# Patient Record
Sex: Male | Born: 1955 | Race: White | Hispanic: No | Marital: Married | Smoking: Former smoker
Health system: Southern US, Community
[De-identification: ages and names within clinical notes are randomized; demographics above are authoritative.]

## PROBLEM LIST (undated history)

## (undated) DIAGNOSIS — I472 Ventricular tachycardia: Secondary | ICD-10-CM

## (undated) DIAGNOSIS — F419 Anxiety disorder, unspecified: Secondary | ICD-10-CM

## (undated) DIAGNOSIS — N179 Acute kidney failure, unspecified: Secondary | ICD-10-CM

## (undated) DIAGNOSIS — J449 Chronic obstructive pulmonary disease, unspecified: Secondary | ICD-10-CM

## (undated) DIAGNOSIS — I509 Heart failure, unspecified: Secondary | ICD-10-CM

## (undated) DIAGNOSIS — N200 Calculus of kidney: Secondary | ICD-10-CM

## (undated) DIAGNOSIS — R931 Abnormal findings on diagnostic imaging of heart and coronary circulation: Secondary | ICD-10-CM

## (undated) DIAGNOSIS — A400 Sepsis due to streptococcus, group A: Secondary | ICD-10-CM

## (undated) DIAGNOSIS — I10 Essential (primary) hypertension: Secondary | ICD-10-CM

## (undated) DIAGNOSIS — I700 Atherosclerosis of aorta: Secondary | ICD-10-CM

## (undated) DIAGNOSIS — Z7902 Long term (current) use of antithrombotics/antiplatelets: Secondary | ICD-10-CM

## (undated) DIAGNOSIS — I255 Ischemic cardiomyopathy: Secondary | ICD-10-CM

## (undated) DIAGNOSIS — I2510 Atherosclerotic heart disease of native coronary artery without angina pectoris: Secondary | ICD-10-CM

## (undated) DIAGNOSIS — I5020 Unspecified systolic (congestive) heart failure: Secondary | ICD-10-CM

## (undated) DIAGNOSIS — I779 Disorder of arteries and arterioles, unspecified: Secondary | ICD-10-CM

## (undated) DIAGNOSIS — R6521 Severe sepsis with septic shock: Secondary | ICD-10-CM

## (undated) DIAGNOSIS — R55 Syncope and collapse: Secondary | ICD-10-CM

## (undated) DIAGNOSIS — Z7982 Long term (current) use of aspirin: Secondary | ICD-10-CM

## (undated) DIAGNOSIS — I272 Pulmonary hypertension, unspecified: Secondary | ICD-10-CM

## (undated) DIAGNOSIS — E785 Hyperlipidemia, unspecified: Secondary | ICD-10-CM

## (undated) DIAGNOSIS — Z95810 Presence of automatic (implantable) cardiac defibrillator: Secondary | ICD-10-CM

## (undated) DIAGNOSIS — R9430 Abnormal result of cardiovascular function study, unspecified: Secondary | ICD-10-CM

## (undated) DIAGNOSIS — Z860100 Personal history of colon polyps, unspecified: Secondary | ICD-10-CM

## (undated) DIAGNOSIS — Z801 Family history of malignant neoplasm of trachea, bronchus and lung: Secondary | ICD-10-CM

## (undated) DIAGNOSIS — J189 Pneumonia, unspecified organism: Secondary | ICD-10-CM

## (undated) DIAGNOSIS — I444 Left anterior fascicular block: Secondary | ICD-10-CM

## (undated) HISTORY — PX: PROSTATE SURGERY: SHX751

## (undated) HISTORY — DX: Family history of malignant neoplasm of trachea, bronchus and lung: Z80.1

## (undated) HISTORY — DX: Personal history of colonic polyps: Z86.010

## (undated) HISTORY — PX: OTHER SURGICAL HISTORY: SHX169

## (undated) HISTORY — DX: Unspecified systolic (congestive) heart failure: I50.20

## (undated) HISTORY — DX: Essential (primary) hypertension: I10

## (undated) HISTORY — DX: Hyperlipidemia, unspecified: E78.5

## (undated) HISTORY — PX: TUMOR REMOVAL: SHX12

## (undated) HISTORY — PX: CORONARY STENT PLACEMENT: SHX1402

## (undated) HISTORY — DX: Sepsis due to Streptococcus, group A: A40.0

## (undated) HISTORY — DX: Sepsis due to Streptococcus, group A: N17.9

## (undated) HISTORY — DX: Personal history of colon polyps, unspecified: Z86.0100

## (undated) HISTORY — PX: APPENDECTOMY: SHX54

## (undated) HISTORY — DX: Presence of automatic (implantable) cardiac defibrillator: Z95.810

## (undated) HISTORY — DX: Ischemic cardiomyopathy: I25.5

## (undated) HISTORY — DX: Pneumonia, unspecified organism: J18.9

## (undated) HISTORY — DX: Syncope and collapse: R55

## (undated) HISTORY — DX: Ventricular tachycardia: I47.2

## (undated) HISTORY — DX: Atherosclerotic heart disease of native coronary artery without angina pectoris: I25.10

## (undated) HISTORY — DX: Severe sepsis with septic shock: R65.21

---

## 1991-07-04 DIAGNOSIS — I2109 ST elevation (STEMI) myocardial infarction involving other coronary artery of anterior wall: Secondary | ICD-10-CM

## 1991-07-04 HISTORY — DX: ST elevation (STEMI) myocardial infarction involving other coronary artery of anterior wall: I21.09

## 1991-07-05 HISTORY — PX: CORONARY ANGIOPLASTY: SHX604

## 1991-08-06 HISTORY — PX: CORONARY ANGIOPLASTY: SHX604

## 1991-08-09 HISTORY — PX: CORONARY ANGIOPLASTY: SHX604

## 1991-10-28 HISTORY — PX: CORONARY ANGIOPLASTY: SHX604

## 1991-11-02 HISTORY — PX: CORONARY ANGIOPLASTY: SHX604

## 1992-01-10 HISTORY — PX: CORONARY ANGIOPLASTY: SHX604

## 1993-09-26 HISTORY — PX: CORONARY ANGIOPLASTY: SHX604

## 1997-06-29 DIAGNOSIS — I2129 ST elevation (STEMI) myocardial infarction involving other sites: Secondary | ICD-10-CM

## 1997-06-29 HISTORY — DX: ST elevation (STEMI) myocardial infarction involving other sites: I21.29

## 1997-06-29 HISTORY — PX: CORONARY ANGIOPLASTY WITH STENT PLACEMENT: SHX49

## 1997-06-29 LAB — HM HEPATITIS C SCREENING LAB: HM Hepatitis Screen: NEGATIVE

## 1998-07-12 HISTORY — PX: CORONARY ANGIOPLASTY WITH STENT PLACEMENT: SHX49

## 1999-07-30 HISTORY — PX: TUMOR REMOVAL: SHX12

## 2004-05-09 ENCOUNTER — Inpatient Hospital Stay: Payer: Self-pay | Admitting: Internal Medicine

## 2004-05-09 ENCOUNTER — Ambulatory Visit: Payer: Self-pay | Admitting: Internal Medicine

## 2004-05-10 ENCOUNTER — Other Ambulatory Visit: Payer: Self-pay

## 2005-07-17 ENCOUNTER — Ambulatory Visit: Payer: Self-pay | Admitting: Internal Medicine

## 2005-07-25 ENCOUNTER — Ambulatory Visit: Payer: Self-pay | Admitting: Internal Medicine

## 2006-09-11 HISTORY — PX: CORONARY ANGIOPLASTY: SHX604

## 2007-09-07 ENCOUNTER — Ambulatory Visit: Payer: Self-pay | Admitting: Cardiovascular Disease

## 2009-05-24 ENCOUNTER — Ambulatory Visit: Payer: Self-pay | Admitting: Internal Medicine

## 2010-01-17 HISTORY — PX: CORONARY ANGIOPLASTY: SHX604

## 2010-02-23 ENCOUNTER — Ambulatory Visit: Payer: Self-pay | Admitting: Cardiovascular Disease

## 2010-02-23 DIAGNOSIS — I2510 Atherosclerotic heart disease of native coronary artery without angina pectoris: Secondary | ICD-10-CM

## 2010-02-23 DIAGNOSIS — I959 Hypotension, unspecified: Secondary | ICD-10-CM | POA: Insufficient documentation

## 2010-02-23 DIAGNOSIS — I25118 Atherosclerotic heart disease of native coronary artery with other forms of angina pectoris: Secondary | ICD-10-CM | POA: Insufficient documentation

## 2010-02-23 DIAGNOSIS — E785 Hyperlipidemia, unspecified: Secondary | ICD-10-CM

## 2010-02-26 ENCOUNTER — Encounter: Payer: Self-pay | Admitting: Cardiovascular Disease

## 2010-08-28 NOTE — Cardiovascular Report (Signed)
Summary: Cardiac Cath Other  Cardiac Cath Other   Imported By: West Carbo 02/26/2010 08:26:37  _____________________________________________________________________  External Attachment:    Type:   Image     Comment:   External Document

## 2010-08-28 NOTE — Letter (Signed)
Summary: Historic Patient File  Historic Patient File   Imported By: West Carbo 02/26/2010 08:27:12  _____________________________________________________________________  External Attachment:    Type:   Image     Comment:   External Document

## 2010-08-28 NOTE — Letter (Signed)
Summary: External Correspondence  External Correspondence   Imported By: West Carbo 02/23/2010 13:39:34  _____________________________________________________________________  External Attachment:    Type:   Image     Comment:   External Document

## 2010-08-28 NOTE — Assessment & Plan Note (Signed)
Summary: NP6/GLC   Visit Type:  Initial Consult  CC:  c/o going to [facility] with chest pain in June 2011 was set up for stress test and troponin levels then June 22 and 2011 went to [facility] with chest pain and to discuss stress test and was then set up for a cardiac cath..  History of Present Illness: Ian Mills is a 55 year old gentleman with a history of coronary artery disease, 3 stents placed to his mid LAD, one stent to the PDA, one stent to the OM 3 in [place] 2008 with recent episodes of chest pressure during his divorce with stress test at [facility], followup cardiac catheterization at [facility]. He presents to establish care. He was last seen at [facility] in September 2009.  he reports that otherwise he is doing well. He denies any recent episodes of chest pain and reports that these have resolved since his divorce was finalized. He has no shortness of breath or symptoms with exertion. He is asking for all new prescriptions today. He does have a current care physician.  MRI done of his heart in February 2008 showed anterior and anterolateral region was nonviable. Ejection fraction by catheterization at that time was 35%.  Cholesterol in February 2009 showed total cholesterol 173, LDL 107, HDL 42, triglycerides 121  His cardiac catheterization dated February 2008 documents 40% proximal RCA disease, 40% mid left circumflex, 80% mid LAD, small D2 and D3.  EKG shows normal sinus rhythm with rate 77 beats per minute, Q waves in the anterior leads consistent with old anterior infarct, T wave abnormality in leads V3 through V6, one and aVL  Preventive Screening-Counseling & Management  Alcohol-Tobacco     Smoking Status: quit  Caffeine-Diet-Exercise     Does Patient Exercise: yes      Drug Use:  no.    Current Medications (verified): 1)  Aspirin 81 Mg Tbec (Aspirin) .Marland Kitchen.. 1 Tablet Once Daily 2)  Ramipril 10 Mg Caps (Ramipril) .Marland Kitchen.. 1  Tablet Two Times A Day 3)  Klor-Con 10 10 Meq Cr-Tabs (Potassium Chloride) .Marland Kitchen.. 1 Tablet Once Daily 4)  Alprazolam 0.5 Mg Tabs (Alprazolam) .... As Needed 5)  Caduet 10-80 Mg Tabs (Amlodipine-Atorvastatin) .Marland Kitchen.. 1 Tablet Once Daily 6)  Zetia 10 Mg Tabs (Ezetimibe) .Marland Kitchen.. 1 Tablet Once Daily 7)  Furosemide 40 Mg Tabs (Furosemide) .Marland Kitchen.. 1 Tablet Once Daily 8)  Digoxin 0.125 Mg Tabs (Digoxin) .Marland Kitchen.. 1 Tablet Once Daily  Allergies (verified): 1)  ! * Ntg  Past History:  Past Medical History: CAD Hyperlipidemia Hypertension  Past Surgical History: CAD; stents x 3 mid LAD, one stent PDA, one stent OM3 in February 2008.  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   smoked 1 p q two days x 6 yrs. Regular Exercise - yes Drug Use - no Smoking Status:  quit Does Patient Exercise:  yes Drug Use:  no  Review of Systems  The patient denies fever, weight loss, weight gain, vision loss, decreased hearing, hoarseness, chest pain, syncope, dyspnea on exertion, peripheral edema, prolonged cough, abdominal pain, incontinence, muscle weakness, depression, and enlarged lymph nodes.    Vital Signs:  Patient profile:   55 year old male Height:      68 inches Weight:      195 pounds BMI:     29.76 Pulse rate:  77 / minute BP sitting:   156 / 96  (left arm) Cuff size:   large  Vitals Entered By: Bishop Dublin, CMA (February 23, 2010 10:08 AM)  Physical Exam  General:  Well developed, well nourished, in no acute distress. Head:  normocephalic and atraumatic Neck:  Neck supple, no JVD. No masses, thyromegaly or abnormal cervical nodes. Lungs:  Clear bilaterally to auscultation and percussion. Heart:  Non-displaced PMI, chest non-tender; regular rate and rhythm, S1, S2 without murmurs, rubs or gallops. Carotid upstroke normal, no bruit. . Pedals normal pulses. No edema, no  varicosities. Abdomen:  Bowel sounds positive; abdomen soft and non-tender without masses Msk:  Back normal, normal gait. Muscle strength and tone normal. Pulses:  pulses normal in all 4 extremities Extremities:  No clubbing or cyanosis. Neurologic:  Alert and oriented x 3. Skin:  Intact without lesions or rashes. Psych:  Normal affect.   Impression & Recommendations:  Problem # 1:  CAD (ICD-414.00) history of large anterior infarct with stents placed at [facility] and 2008, MRI showing nonviable anterior wall with recent stress test and catheterization at [facility] showing stable disease, per the patient. Report is not available to [place] at this time. We will continue him on aggressive lipid management, continue beta blockers and ACE inhibitor. He is not on Plavix and is not want to be on this medication. We will increase his aspirin to 81 mg x2.  His updated medication list for this problem includes:    Aspirin 81 Mg Tbec (Aspirin) .Marland Kitchen... 2 tablet once daily    Ramipril 10 Mg Caps (Ramipril) .Marland Kitchen... 1 tablet two times a day    Carvedilol 3.125 Mg Tabs (Carvedilol) .Marland Kitchen... Take one tablet by mouth twice a day  Problem # 2:  HYPERLIPIDEMIA-MIXED (ICD-272.4) Will try to obtain a copy of his most recent lipid panel.  His updated medication list for this problem includes:    Caduet 10-80 Mg Tabs (Amlodipine-atorvastatin) .Marland Kitchen... 1 tablet once daily    Zetia 10 Mg Tabs (Ezetimibe) .Marland Kitchen... 1 tablet once daily  Problem # 3:  HYPERTENSION, BENIGN (ICD-401.1) Blood pressure was elevated on initial evaluation though he reports it is well controlled at home. I've asked him to continue to monitor his blood pressure at work and to call us if he has systolic pressures greater than 140.  His updated medication list for this problem includes:    Aspirin 81 Mg Tbec (Aspirin) .Marland Kitchen... 2 tablet once daily    Ramipril 10 Mg Caps (Ramipril) .Marland Kitchen... 1 tablet two times a day    Caduet 10-80 Mg Tabs (Amlodipine-atorvastatin) .Marland Kitchen...  1 tablet once daily    Furosemide 40 Mg Tabs (Furosemide) .Marland Kitchen... 1 tablet once daily    Carvedilol 3.125 Mg Tabs (Carvedilol) .Marland Kitchen... Take one tablet by mouth twice a day  Other Orders: EKG w/ Interpretation (93000)  Patient Instructions: 1)  Your physician has recommended you make the following change in your medication: Increase aspirin 81mg  2 tabs daily 2)  Your physician wants you to follow-up in:   1 year You will receive a reminder letter in the mail two months in advance. If you don't receive a letter, please call our office to schedule the follow-up appointment. Prescriptions: KLOR-CON 10 10 MEQ CR-TABS (POTASSIUM CHLORIDE) 1 tablet once daily  #90 x 4   Entered by:   Benedict Needy, RN   Authorized by:   Dossie Arbour MD   Signed by:   Benedict Needy, RN on 02/23/2010   Method  used:   Electronically to        [facility]* (retail)       [address]       [place], [place]  [address]       Ph: [phone]       Fax: [phone]   RxID:   [id number] CADUET 10-80 MG TABS (AMLODIPINE-ATORVASTATIN) 1 tablet once daily  #90 x 4   Entered by:   Benedict Needy, RN   Authorized by:   Dossie Arbour MD   Signed by:   Benedict Needy, RN on 02/23/2010   Method used:   Electronically to        [facility]* (retail)       [address]       [place], [place]  [address]       Ph: [phone]       Fax: [phone]   RxID:   [id number] ZETIA 10 MG TABS (EZETIMIBE) 1 tablet once daily  #90 x 4   Entered by:   Benedict Needy, RN   Authorized by:   Dossie Arbour MD   Signed by:   Benedict Needy, RN on 02/23/2010   Method used:   Electronically to        [facility]* (retail)       [address]       [place], [place]  [address]       Ph: [phone]       Fax: [phone]   RxID:   [id number] FUROSEMIDE 40 MG TABS (FUROSEMIDE) 1 tablet once daily  #90 x 4    Entered by:   Benedict Needy, RN   Authorized by:   Dossie Arbour MD   Signed by:   Benedict Needy, RN on 02/23/2010   Method used:   Electronically to        [facility]* (retail)       [address]       [place], [place]  [address]       Ph: [phone]       Fax: [phone]   RxID:   [id number] DIGOXIN 0.125 MG TABS (DIGOXIN) 1 tablet once daily  #90 x 4   Entered by:   Benedict Needy, RN   Authorized by:   Dossie Arbour MD   Signed by:   Benedict Needy, RN on 02/23/2010   Method used:   Electronically to        [facility]* (retail)       [address]       [place], [place]  [address]       Ph: [phone]       Fax: [phone]   RxID:   [id number] RAMIPRIL 10 MG CAPS (RAMIPRIL) 1 tablet two times a day  #90 x 4   Entered by:   Benedict Needy, RN   Authorized by:   Dossie Arbour MD   Signed by:   Benedict Needy, RN on 02/23/2010   Method used:   Electronically to        [facility]* (retail)       [address]       [address]  [place], [place]  [address]       Ph: [phone]       Fax: [phone]   RxID:   [id number] CARVEDILOL 3.125 MG TABS (CARVEDILOL) Take one tablet by mouth twice a day  #180 x 4   Entered by:   Benedict Needy, RN   Authorized by:   Dossie Arbour MD   Signed by:   Benedict Needy, RN on 02/23/2010   Method used:   Electronically to        [facility]* (retail)       [address]       [place], [place]  [address]       Ph: [phone]       Fax: [phone]   RxID:   [id number]

## 2010-09-24 ENCOUNTER — Emergency Department: Payer: Self-pay | Admitting: Emergency Medicine

## 2010-09-24 ENCOUNTER — Encounter: Payer: Self-pay | Admitting: Cardiovascular Disease

## 2010-09-26 ENCOUNTER — Encounter: Payer: Self-pay | Admitting: Cardiovascular Disease

## 2010-09-26 ENCOUNTER — Ambulatory Visit (INDEPENDENT_AMBULATORY_CARE_PROVIDER_SITE_OTHER): Payer: PRIVATE HEALTH INSURANCE | Admitting: Cardiovascular Disease

## 2010-09-26 DIAGNOSIS — R55 Syncope and collapse: Secondary | ICD-10-CM

## 2010-09-26 HISTORY — DX: Syncope and collapse: R55

## 2010-10-04 NOTE — Assessment & Plan Note (Signed)
Summary: F/U from ED/AMD   Visit Type:  Follow-up Primary Provider:  None  CC:  c/o syncope episode sunday afternoon. Feels weak and does not feel right. Denies chest pain and SOB.Marland Kitchen  History of Present Illness: Mr. Dorsch is a 55 year old gentleman with a history of coronary artery disease, 3 stents placed to his mid LAD, one stent to the PDA, one stent to the OM 3 in [place] 2008 with episodes of chest pressure during his divorce with stress test at [facility], followup cardiac catheterization at [facility], History of syncope x4 episodes total over the past several years..   we received a call earlier this week that Mr. Felch had had a syncopal episode. He had evaluation in the emergency room including cardiac enzymes and EKG. No significant change was noted. He was discharged home with close followup in her office.  He presents today and reports that he had a syncopal episode this past Sunday. 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 Symptoms got worse lasting for 15 seconds up to one minute. He stood up and was standing in the doorway and next found himself on the ground. He had hit his head.  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  he continues to have significant stressors with his divorce.  MRI done of his heart in February 2008 showed anterior and anterolateral region was nonviable. Ejection fraction by catheterization at that time was 35%.  Cholesterol in February 2009 showed total cholesterol 173, LDL 107, HDL 42, triglycerides 121  His cardiac catheterization dated February 2008 documents 40% proximal RCA disease, 40% mid left circumflex, 80% mid LAD, small D2 and D3.  EKG shows normal sinus rhythm with rate 69 beats per minute, old anterior infarct, T wave abnormality in the anterolateral leads, left axis  deviation  Current Medications (verified): 1)  Aspirin 81 Mg Tbec (Aspirin) .... 2 Tablet Once Daily 2)  Ramipril 10 Mg Caps (Ramipril) .Marland Kitchen.. 1 Tablet Two Times A Day 3)  Klor-Con 10 10 Meq Cr-Tabs (Potassium Chloride) .Marland Kitchen.. 1 Tablet Once Daily 4)  Caduet 10-80 Mg Tabs (Amlodipine-Atorvastatin) .Marland Kitchen.. 1 Tablet Once Daily 5)  Zetia 10 Mg Tabs (Ezetimibe) .Marland Kitchen.. 1 Tablet Once Daily 6)  Furosemide 40 Mg Tabs (Furosemide) .Marland Kitchen.. 1 Tablet Once Daily 7)  Digoxin 0.125 Mg Tabs (Digoxin) .Marland Kitchen.. 1 Tablet Once Daily 8)  Carvedilol 3.125 Mg Tabs (Carvedilol) .... Take One Tablet By Mouth Twice A Day  Allergies (verified): 1)  ! * Ntg  Past History:  Past Medical History: Last updated: 02/23/2010 CAD Hyperlipidemia Hypertension  Past Surgical History: Last updated: 02/23/2010 CAD; stents x 3 mid LAD, one stent PDA, one stent OM3 in February 2008.  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   smoked 1 p q two days x 6 yrs. Regular Exercise - yes Drug Use - no  Risk Factors: Exercise: yes (02/23/2010)  Risk Factors: Smoking Status: quit (02/23/2010)  Review of Systems       The patient complains of syncope.  The patient denies fever, weight loss, weight gain, vision loss, decreased hearing, hoarseness, chest pain, dyspnea on exertion, peripheral edema, prolonged cough, abdominal pain, incontinence, muscle  weakness, depression, and enlarged lymph nodes.    Vital Signs:  Patient profile:   55 year old male Height:      68 inches Weight:      202.50 pounds BMI:     30.90 Pulse rate:   69 / minute BP sitting:   130 / 77  (left arm) Cuff size:   regular  Vitals Entered By: Lysbeth Galas CMA (September 26, 2010 9:24 AM)  Physical Exam  General:  Well developed, well nourished, in no acute distress. Head:   normocephalic and atraumatic Neck:  Neck supple, no JVD. No masses, thyromegaly or abnormal cervical nodes. Lungs:  Clear bilaterally to auscultation and percussion. Heart:  Non-displaced PMI, chest non-tender; regular rate and rhythm, S1, S2 without murmurs, rubs or gallops. Carotid upstroke normal, no bruit. Pedals normal pulses. No edema, no varicosities. Abdomen:  Bowel sounds positive; abdomen soft and non-tender without masses Msk:  Back normal, normal gait. Muscle strength and tone normal. Pulses:  pulses normal in all 4 extremities Extremities:  No clubbing or cyanosis. Neurologic:  Alert and oriented x 3. Skin:  Intact without lesions or rashes. Psych:  Normal affect.   Impression & Recommendations:  Problem # 1:  SYNCOPE AND COLLAPSE (ICD-780.2) Assessment Unchanged  syncope is concerning for vasovagal episode or even arrhythmia. He reports that he has had Holter monitors in the past and he is not particularly interested in repeating this at this time.  We will hold his Caduet, start Lipitor 80 mg daily, change his amlodipine to Diovan/HCTZ 80/12.5 mg daily. I've asked him to monitor his blood pressure. We will likely also increase his Coreg to 6.25 mg b.i.d..  I am concerned about anterior wall scar and the possibility of VT.  We will talk to him about this on his next visit. We will see whether he would like a followup with [facility] for a EP study.  His updated medication list for this problem includes:    Aspirin 81 Mg Tbec (Aspirin) .Marland Kitchen... 2 tablet once daily    Ramipril 10 Mg Caps (Ramipril) .Marland Kitchen... 1 tablet two times a day    Carvedilol 3.125 Mg Tabs (Carvedilol) .Marland Kitchen... Take one tablet by mouth twice a day  His updated medication list for this problem includes:    Aspirin 81 Mg Tbec (Aspirin) .Marland Kitchen... 2 tablet once daily    Ramipril 10 Mg Caps (Ramipril) .Marland Kitchen... 1 tablet two times a day    Carvedilol 3.125 Mg Tabs (Carvedilol) .Marland Kitchen... Take one tablet by mouth twice a  day  Problem # 2:  HYPERTENSION, BENIGN (ICD-401.1)  blood pressure adequate on today's visit. We have made some medication changes as above. Will continue to watch his blood pressure.  His updated medication list for this problem includes:    Aspirin 81 Mg Tbec (Aspirin) .Marland Kitchen... 2 tablet once daily    Ramipril 10 Mg Caps (Ramipril) .Marland Kitchen... 1 tablet two times a day    Furosemide 40 Mg Tabs (Furosemide) .Marland Kitchen... 1 tablet once daily    Carvedilol 3.125 Mg Tabs (Carvedilol) .Marland Kitchen... Take one tablet by mouth twice a day    Diovan Hct 80-12.5 Mg Tabs (Valsartan-hydrochlorothiazide) .Marland Kitchen... Take one tablet by mouth once daily.  His updated medication list for this problem includes:    Aspirin 81 Mg Tbec (Aspirin) .Marland Kitchen... 2 tablet once daily    Ramipril 10 Mg Caps (Ramipril) .Marland Kitchen... 1 tablet two times a day    Furosemide 40 Mg Tabs (Furosemide) .Marland Kitchen... 1 tablet once  daily    Carvedilol 3.125 Mg Tabs (Carvedilol) .Marland Kitchen... Take one tablet by mouth twice a day    Diovan Hct 80-12.5 Mg Tabs (Valsartan-hydrochlorothiazide) .Marland Kitchen... Take one tablet by mouth once daily.  Problem # 3:  HYPERLIPIDEMIA-MIXED (ICD-272.4)  Continue current regiment. Goal LDL is less than 70.  His updated medication list for this problem includes:    Lipitor 80 Mg Tabs (Atorvastatin calcium) .Marland Kitchen... Take one tablet by mouth daily.    Zetia 10 Mg Tabs (Ezetimibe) .Marland Kitchen... 1 tablet once daily  His updated medication list for this problem includes:    Lipitor 80 Mg Tabs (Atorvastatin calcium) .Marland Kitchen... Take one tablet by mouth daily.    Zetia 10 Mg Tabs (Ezetimibe) .Marland Kitchen... 1 tablet once daily  Problem # 4:  CAD (ICD-414.00)  He does have severe coronary artery disease dating back to his 30s. Cardiac catheter done at [facility] last year showed stable disease.  His updated medication list for this problem includes:    Aspirin 81 Mg Tbec (Aspirin) .Marland Kitchen... 2 tablet once daily    Ramipril 10 Mg Caps (Ramipril) .Marland Kitchen... 1 tablet two times a day    Carvedilol 3.125 Mg  Tabs (Carvedilol) .Marland Kitchen... Take one tablet by mouth twice a day  His updated medication list for this problem includes:    Aspirin 81 Mg Tbec (Aspirin) .Marland Kitchen... 2 tablet once daily    Ramipril 10 Mg Caps (Ramipril) .Marland Kitchen... 1 tablet two times a day    Carvedilol 3.125 Mg Tabs (Carvedilol) .Marland Kitchen... Take one tablet by mouth twice a day  Other Orders: EKG w/ Interpretation (93000)  Patient Instructions: 1)  Your physician recommends that you schedule a follow-up appointment in: 6 months 2)  Your physician has requested that you regularly monitor and record your blood pressure readings at home.  Please use the same machine at the same time of day to check your readings and record them to bring to your follow-up visit. 3)  Your physician has recommended you make the following change in your medication: STOP Caduet. START Lipitor 80mg  once daily. START Diovan HCT 80-12.5mg  SAMPLES. Monitor BP and call us with results. If this medication works for you, let us know to call in prescription. Prescriptions: DIOVAN HCT 80-12.5 MG TABS (VALSARTAN-HYDROCHLOROTHIAZIDE) Take one tablet by mouth once daily.  #30 x 6   Entered by:   Lanny Hurst RN   Authorized by:   Dossie Arbour MD   Signed by:   Lanny Hurst RN on 09/26/2010   Method used:   Electronically to        [facility]* (retail)       [address]       [place], [place]  [address]       Ph: [phone]       Fax: [phone]   RxID:   [id number] LIPITOR 80 MG TABS (ATORVASTATIN CALCIUM) Take one tablet by mouth daily.  #30 x 6   Entered by:   Lanny Hurst RN   Authorized by:   Dossie Arbour MD   Signed by:   Lanny Hurst RN on 09/26/2010   Method used:   Electronically to        [facility]* (retail)       [address]       [place], [place]  [address]       Ph: [phone]  Fax: [phone]   RxID:   [id number] DIOVAN HCT 80-12.5 MG TABS  (VALSARTAN-HYDROCHLOROTHIAZIDE)   #28 x 0   Entered by:   Lanny Hurst RN   Authorized by:   Dossie Arbour MD   Signed by:   Lanny Hurst RN on 09/26/2010   Method used:   Samples Given   RxID:   [id number]

## 2010-10-04 NOTE — Letter (Signed)
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 [address]   [place], [place] [address]   Phone: [phone]  Fax: [phone]     September 26, 2010    Ian Mills   The above named patient needs to be excused from work for medical reasons starting Monday 09/24/10 through Monday 10/01/10. Patient may return to work 10/02/10.  Please take this into consideration when reviewing the time away from work.    Sincerely yours,         Dossie Arbour, MD, PhD

## 2010-11-26 ENCOUNTER — Other Ambulatory Visit: Payer: Self-pay | Admitting: Emergency Medicine

## 2010-11-26 MED ORDER — RAMIPRIL 10 MG PO TABS: 10.0000 mg | ORAL_TABLET | Freq: Two times a day (BID) | ORAL | Status: DC

## 2011-02-12 ENCOUNTER — Encounter: Payer: Self-pay | Admitting: Cardiovascular Disease

## 2011-03-19 ENCOUNTER — Telehealth: Payer: Self-pay

## 2011-03-19 MED ORDER — DIGOXIN 125 MCG PO TABS: 125.0000 ug | ORAL_TABLET | Freq: Every day | ORAL | Status: DC

## 2011-03-19 MED ORDER — POTASSIUM CHLORIDE 10 MEQ PO TBCR: 10.0000 meq | EXTENDED_RELEASE_TABLET | Freq: Every day | ORAL | Status: DC

## 2011-03-19 MED ORDER — FUROSEMIDE 40 MG PO TABS: 40.0000 mg | ORAL_TABLET | Freq: Every day | ORAL | Status: DC

## 2011-03-19 NOTE — Telephone Encounter (Signed)
Please refill the furosemide, potassium & digoxin.

## 2011-04-29 ENCOUNTER — Telehealth: Payer: Self-pay

## 2011-04-29 NOTE — Telephone Encounter (Signed)
Refill for zetia 10 mg take one tablet daily.

## 2011-05-01 ENCOUNTER — Telehealth: Payer: Self-pay | Admitting: *Deleted

## 2011-05-01 MED ORDER — EZETIMIBE 10 MG PO TABS: 10.0000 mg | ORAL_TABLET | Freq: Every day | ORAL | Status: DC

## 2011-05-01 MED ORDER — CARVEDILOL 3.125 MG PO TABS: 3.1250 mg | ORAL_TABLET | Freq: Two times a day (BID) | ORAL | Status: DC

## 2011-05-01 MED ORDER — ATORVASTATIN CALCIUM 80 MG PO TABS: 80.0000 mg | ORAL_TABLET | Freq: Every day | ORAL | Status: DC

## 2011-05-01 NOTE — Telephone Encounter (Signed)
Per Dr. Mariah Milling will refill pt's Coreg, Zetia, and cholesterol meds. (FYI-this has been done for "[facility] [employer]")

## 2011-07-31 ENCOUNTER — Telehealth: Payer: Self-pay | Admitting: *Deleted

## 2011-07-31 MED ORDER — TADALAFIL 10 MG PO TABS: 10.0000 mg | ORAL_TABLET | Freq: Every day | ORAL | Status: DC | PRN

## 2011-07-31 NOTE — Telephone Encounter (Signed)
Per Dr. Mariah Milling, Cialis 10mg  #10 sent in to [facility] pharmacy.

## 2011-08-01 ENCOUNTER — Telehealth: Payer: Self-pay

## 2011-08-20 NOTE — Telephone Encounter (Signed)
Error

## 2011-11-07 ENCOUNTER — Emergency Department: Payer: Self-pay | Admitting: Internal Medicine

## 2011-11-26 ENCOUNTER — Emergency Department: Payer: Self-pay | Admitting: Emergency Medicine

## 2011-11-26 LAB — COMPREHENSIVE METABOLIC PANEL
Albumin: 4 g/dL (ref 3.4–5.0)
Alkaline Phosphatase: 101 U/L (ref 50–136)
Bilirubin,Total: 0.6 mg/dL (ref 0.2–1.0)
Calcium, Total: 9.4 mg/dL (ref 8.5–10.1)
Chloride: 106 mmol/L (ref 98–107)
Creatinine: 1.01 mg/dL (ref 0.60–1.30)
EGFR (African American): >60
EGFR (Non-African Amer.): >60
Glucose: 132 mg/dL — ABNORMAL HIGH (ref 65–99)
SGOT(AST): 45 U/L — ABNORMAL HIGH (ref 15–37)
Sodium: 138 mmol/L (ref 136–145)
Total Protein: 8.1 g/dL (ref 6.4–8.2)

## 2011-11-26 LAB — CK TOTAL AND CKMB (NOT AT ARMC)
CK, Total: 206 U/L (ref 35–232)
CK-MB: 3.3 ng/mL (ref 0.5–3.6)

## 2011-11-26 LAB — CBC
HCT: 51.3 % (ref 40.0–52.0)
HGB: 17.6 g/dL (ref 13.0–18.0)
MCH: 32.5 pg (ref 26.0–34.0)
MCHC: 34.3 g/dL (ref 32.0–36.0)
MCV: 95 fL (ref 80–100)
Platelet: 211 10*3/uL (ref 150–440)
WBC: 7.2 10*3/uL (ref 3.8–10.6)

## 2011-11-26 LAB — MAGNESIUM: Magnesium: 2 mg/dL

## 2011-11-26 LAB — TROPONIN I: Troponin-I: 1.4 ng/mL — ABNORMAL HIGH

## 2011-11-27 DIAGNOSIS — I472 Ventricular tachycardia, unspecified: Secondary | ICD-10-CM

## 2011-11-27 DIAGNOSIS — I214 Non-ST elevation (NSTEMI) myocardial infarction: Secondary | ICD-10-CM

## 2011-11-27 HISTORY — DX: Non-ST elevation (NSTEMI) myocardial infarction: I21.4

## 2011-11-27 HISTORY — PX: CORONARY ANGIOPLASTY: SHX604

## 2011-11-27 HISTORY — PX: CARDIAC DEFIBRILLATOR PLACEMENT: SHX171

## 2011-11-27 HISTORY — DX: Ventricular tachycardia, unspecified: I47.20

## 2011-11-29 ENCOUNTER — Other Ambulatory Visit: Payer: Self-pay | Admitting: *Deleted

## 2011-11-29 MED ORDER — RAMIPRIL 10 MG PO TABS: 10.0000 mg | ORAL_TABLET | Freq: Two times a day (BID) | ORAL | Status: DC

## 2011-11-29 NOTE — Telephone Encounter (Signed)
Called pt to make appointment . No answer LMOVM for patient to call us to schedule appointment. Refilled Ramipril enough so that patient can schedule appointment to be seen.

## 2011-11-30 HISTORY — PX: CARDIAC CATHETERIZATION: SHX172

## 2011-12-01 DIAGNOSIS — Z95810 Presence of automatic (implantable) cardiac defibrillator: Secondary | ICD-10-CM

## 2011-12-01 HISTORY — DX: Presence of automatic (implantable) cardiac defibrillator: Z95.810

## 2011-12-01 HISTORY — PX: CARDIAC DEFIBRILLATOR PLACEMENT: SHX171

## 2011-12-11 DIAGNOSIS — Z95810 Presence of automatic (implantable) cardiac defibrillator: Secondary | ICD-10-CM | POA: Insufficient documentation

## 2011-12-25 ENCOUNTER — Encounter: Payer: Self-pay | Admitting: *Deleted

## 2012-01-23 ENCOUNTER — Encounter: Payer: Self-pay | Admitting: Cardiovascular Disease

## 2012-01-23 ENCOUNTER — Ambulatory Visit (INDEPENDENT_AMBULATORY_CARE_PROVIDER_SITE_OTHER): Payer: PRIVATE HEALTH INSURANCE | Admitting: Cardiovascular Disease

## 2012-01-23 VITALS — BP 137/88 | HR 62 | Ht 67.0 in | Wt 190.0 lb

## 2012-01-23 DIAGNOSIS — I10 Essential (primary) hypertension: Secondary | ICD-10-CM

## 2012-01-23 DIAGNOSIS — Z8679 Personal history of other diseases of the circulatory system: Secondary | ICD-10-CM

## 2012-01-23 DIAGNOSIS — E785 Hyperlipidemia, unspecified: Secondary | ICD-10-CM

## 2012-01-23 DIAGNOSIS — I472 Ventricular tachycardia: Secondary | ICD-10-CM

## 2012-01-23 DIAGNOSIS — I2510 Atherosclerotic heart disease of native coronary artery without angina pectoris: Secondary | ICD-10-CM

## 2012-01-23 DIAGNOSIS — R55 Syncope and collapse: Secondary | ICD-10-CM

## 2012-01-23 HISTORY — DX: Personal history of other diseases of the circulatory system: Z86.79

## 2012-01-23 NOTE — Progress Notes (Signed)
Patient ID: Ian Mills, male    DOB: 1956/02/25, 56 y.o.   MRN: [id number]  HPI Comments: Ian Mills is a 56 year old gentleman with a history of coronary artery disease, 3 stents placed to his mid LAD, one stent to the PDA, one stent to the OM 3 in [place] 2008 with episodes of chest pressure during his divorce with stress test at [facility], followup cardiac catheterization at [facility], History of syncope x4 episodes total over the past several years.     He reports having ventricular tachycardia while in the emergency room while at work. He was diaphoretic, required resuscitation. He was transferred to [facility] and had a cardiac catheterization with no PCI. By report, echocardiogram was also performed. He had defibrillator placed 12/01/2011 presumably given his large anterior scar and the VT requiring defibrillation in the emergency room. Last followup at [facility] EP was June 16. He has received a home monitoring unit at the site of a land line. He reports that he will use a friend's phone land line to do down loads. He is interested in transferring his care for his ICD to our office. He reports that he is not ready to go back to work yet as he feels weak. [facility] EP suggested that he see a psychiatrist for depression. He does not feel that he has depression and is still recovering from recent events.   Previous significant stressors with his divorce.   MRI done of his heart in February 2008 showed anterior and anterolateral region was nonviable. Ejection fraction by catheterization at that time was 35%.   Cholesterol in February 2009 showed total cholesterol 173, LDL 107, HDL 42, triglycerides 121   His cardiac catheterization dated February 2008 documents 40% proximal RCA disease, 40% mid left circumflex, 80% mid LAD, small D2 and D3.   EKG shows normal sinus rhythm with rate 64 beats per minute, old anterior infarct, T wave abnormality in the anterolateral leads, left axis  deviation    Outpatient Encounter Prescriptions as of 01/23/2012  Medication Sig Dispense Refill  . amiodarone (PACERONE) 200 MG tablet Take 200 mg by mouth 2 (two) times daily.      Marland Kitchen aspirin 81 MG EC tablet Take 81 mg by mouth daily.       Marland Kitchen atorvastatin (LIPITOR) 80 MG tablet Take 1 tablet (80 mg total) by mouth daily.  30 tablet  6  . carvedilol (COREG) 25 MG tablet Take 25 mg by mouth 2 (two) times daily with a meal.      . clopidogrel (PLAVIX) 75 MG tablet Take 75 mg by mouth daily.      Marland Kitchen ezetimibe (ZETIA) 10 MG tablet Take 1 tablet (10 mg total) by mouth daily.  30 tablet  6  . furosemide (LASIX) 20 MG tablet Take 20 mg by mouth daily.      . potassium chloride (KLOR-CON) 10 MEQ CR tablet Take 1 tablet (10 mEq total) by mouth daily.  90 tablet  3  . ramipril (ALTACE) 10 MG tablet Take 1 tablet (10 mg total) by mouth 2 (two) times daily.  60 tablet  2  . spironolactone (ALDACTONE) 12.5 mg TABS Take 12.5 mg by mouth daily.       Review of Systems  Constitutional: Negative.   HENT: Negative.   Eyes: Negative.   Respiratory: Negative.   Cardiovascular: Negative.   Gastrointestinal: Negative.   Musculoskeletal: Negative.   Skin: Negative.   Neurological: Negative.   Hematological: Negative.  Psychiatric/Behavioral: Negative.   All other systems reviewed and are negative.     BP 137/88  Pulse 62  Ht 5\' 7"  (1.702 m)  Wt 190 lb (86.183 kg)  BMI 29.76 kg/m2  Physical Exam  Nursing note and vitals reviewed. Constitutional: He is oriented to person, place, and time. He appears well-developed and well-nourished.  HENT:  Head: Normocephalic.  Nose: Nose normal.  Mouth/Throat: Oropharynx is clear and moist.  Eyes: Conjunctivae are normal. Pupils are equal, round, and reactive to light.  Neck: Normal range of motion. Neck supple. No JVD present.  Cardiovascular: Normal rate, regular rhythm, S1 normal, S2 normal, normal heart sounds and intact distal pulses.  Exam reveals no  gallop and no friction rub.   No murmur heard. Pulmonary/Chest: Effort normal and breath sounds normal. No respiratory distress. He has no wheezes. He has no rales. He exhibits no tenderness.  Abdominal: Soft. Bowel sounds are normal. He exhibits no distension. There is no tenderness.  Musculoskeletal: Normal range of motion. He exhibits no edema and no tenderness.  Lymphadenopathy:    He has no cervical adenopathy.  Neurological: He is alert and oriented to person, place, and time. Coordination normal.  Skin: Skin is warm and dry. No rash noted. No erythema.  Psychiatric: He has a normal mood and affect. His behavior is normal. Judgment and thought content normal.           Assessment and Plan

## 2012-01-23 NOTE — Assessment & Plan Note (Signed)
Uncertain if he has had any further arrhythmia and stimulator was placed and started on amiodarone. He would like to transfer care of his drug related to her office. We will set him up for defibrillator interrogation at his convenience.

## 2012-01-23 NOTE — Patient Instructions (Addendum)
You are doing well. No medication changes were made.  Need to be established with device clinic/Dr. Graciela Husbands within the next 2 -3 weeks.  Please call us if you have new issues that need to be addressed before your next appt.  Your physician wants you to follow-up in: 3 months.  You will receive a reminder letter in the mail two months in advance. If you don't receive a letter, please call our office to schedule the follow-up appointment.

## 2012-01-23 NOTE — Assessment & Plan Note (Signed)
We have suggested he stay on his current Lipitor and zetia. Goal LDL less than 70.

## 2012-01-23 NOTE — Assessment & Plan Note (Signed)
Long history of syncope and collapse. Uncertain if this long history as correlated with a diagnosed ventricular arrhythmia in the past. Currently with defibrillator.

## 2012-01-23 NOTE — Assessment & Plan Note (Signed)
Stable coronary artery disease by his report. We'll try to obtain the most recent cardiac cath report and echo from [facility].

## 2012-01-23 NOTE — Assessment & Plan Note (Signed)
Blood pressure is well controlled on today's visit. No changes made to the medications. 

## 2012-01-31 ENCOUNTER — Ambulatory Visit (INDEPENDENT_AMBULATORY_CARE_PROVIDER_SITE_OTHER): Payer: PRIVATE HEALTH INSURANCE | Admitting: *Deleted

## 2012-01-31 ENCOUNTER — Encounter: Payer: Self-pay | Admitting: Internal Medicine

## 2012-01-31 ENCOUNTER — Encounter: Payer: Self-pay | Admitting: *Deleted

## 2012-01-31 DIAGNOSIS — I472 Ventricular tachycardia, unspecified: Secondary | ICD-10-CM

## 2012-01-31 DIAGNOSIS — R55 Syncope and collapse: Secondary | ICD-10-CM

## 2012-01-31 LAB — ICD DEVICE OBSERVATION
BRDY-0002RV: 40 {beats}/min
DEV-0020ICD: NEGATIVE
DEVICE MODEL ICD: 105232
TZON-0003FASTVT: 352.9 ms
VENTRICULAR PACING ICD: 1 pct

## 2012-01-31 NOTE — Progress Notes (Signed)
ICD check 

## 2012-02-10 ENCOUNTER — Encounter: Payer: Self-pay | Admitting: Cardiovascular Disease

## 2012-02-25 ENCOUNTER — Encounter: Payer: PRIVATE HEALTH INSURANCE | Admitting: Internal Medicine

## 2012-02-27 ENCOUNTER — Encounter: Payer: Self-pay | Admitting: Cardiovascular Disease

## 2012-03-17 ENCOUNTER — Ambulatory Visit (INDEPENDENT_AMBULATORY_CARE_PROVIDER_SITE_OTHER): Payer: PRIVATE HEALTH INSURANCE | Admitting: Internal Medicine

## 2012-03-17 ENCOUNTER — Encounter: Payer: Self-pay | Admitting: Internal Medicine

## 2012-03-17 VITALS — BP 110/72 | HR 60 | Ht 69.0 in | Wt 189.0 lb

## 2012-03-17 DIAGNOSIS — Z95810 Presence of automatic (implantable) cardiac defibrillator: Secondary | ICD-10-CM | POA: Insufficient documentation

## 2012-03-17 DIAGNOSIS — R55 Syncope and collapse: Secondary | ICD-10-CM

## 2012-03-17 DIAGNOSIS — I255 Ischemic cardiomyopathy: Secondary | ICD-10-CM | POA: Insufficient documentation

## 2012-03-17 DIAGNOSIS — I472 Ventricular tachycardia: Secondary | ICD-10-CM

## 2012-03-17 DIAGNOSIS — I2589 Other forms of chronic ischemic heart disease: Secondary | ICD-10-CM

## 2012-03-17 DIAGNOSIS — Z79899 Other long term (current) drug therapy: Secondary | ICD-10-CM

## 2012-03-17 LAB — ICD DEVICE OBSERVATION
DEVICE MODEL ICD: 105232
HV IMPEDENCE: 91 Ohm
RV LEAD AMPLITUDE: 22.4 mv
RV LEAD IMPEDENCE ICD: 623 Ohm
RV LEAD THRESHOLD: 0.5 V
VENTRICULAR PACING ICD: 1 pct

## 2012-03-17 MED ORDER — AMIODARONE HCL 200 MG PO TABS: 200.0000 mg | ORAL_TABLET | Freq: Every day | ORAL | Status: DC

## 2012-03-17 NOTE — Patient Instructions (Addendum)
Your physician recommends that you schedule a follow-up appointment in: 3 months with Dr Graciela Husbands  Your physician has recommended you make the following change in your medication: Decrease your Amiodarone to 200mg  daily  Your physician recommends that you return for lab work in: today (tsh, lfts)

## 2012-03-17 NOTE — Progress Notes (Signed)
  HPI  Ian Mills is a 56 y.o. male Seen to establish ICD care. He underwent implantation May 2013 following presentation with ventricular tachycardia.  Is a history of ischemic heart disease with prior stenting of his LAD PDA and OM last in 2008. He underwent catheterization in May 2013 without further PCI. The details of that evaluation are not immediately available. MRI scanning 2008 demonstrated a large anterolateral region of non-viability ejection fraction was 35%.  He had modest shortness of breath  is recently undergoing a divorce and is quite challenging.  We have reviewed amiodarone side effects which he had not yet.    Past Medical History  Diagnosis Date  . CAD (coronary artery disease)   . HLD (hyperlipidemia)   . HTN (hypertension)   . H/O ventricular tachycardia 11/2011    s/p Defib implant     Past Surgical History  Procedure Date  . Carotid stent     x3 mid LAD, one stent PDA, one stent OM3 in 2/08  . Cardiac defibrillator placement May 2013  . Tumor removal     chest  . Appendectomy   . Cardiac catheterization Nov 30 2011    [facility]    Current Outpatient Prescriptions  Medication Sig Dispense Refill  . amiodarone (PACERONE) 200 MG tablet Take 200 mg by mouth 2 (two) times daily.      Marland Kitchen aspirin 81 MG EC tablet Take 81 mg by mouth daily.       Marland Kitchen atorvastatin (LIPITOR) 80 MG tablet Take 1 tablet (80 mg total) by mouth daily.  30 tablet  6  . carvedilol (COREG) 25 MG tablet Take 25 mg by mouth 2 (two) times daily with a meal.      . clopidogrel (PLAVIX) 75 MG tablet Take 75 mg by mouth daily.      Marland Kitchen ezetimibe (ZETIA) 10 MG tablet Take 1 tablet (10 mg total) by mouth daily.  30 tablet  6  . furosemide (LASIX) 40 MG tablet Take 40 mg by mouth daily.      . potassium chloride (KLOR-CON) 10 MEQ CR tablet Take 1 tablet (10 mEq total) by mouth daily.  90 tablet  3  . ramipril (ALTACE) 10 MG tablet Take 1 tablet (10 mg total) by mouth 2 (two) times daily.  60 tablet  2   . spironolactone (ALDACTONE) 12.5 mg TABS Take 12.5 mg by mouth daily.        Allergies  Allergen Reactions  . Nitroglycerin     Nauseas    Review of Systems negative except from HPI and PMH  Physical Exam BP 110/72  Pulse 60  Ht 5\' 9"  (1.753 m)  Wt 189 lb (85.73 kg)  BMI 27.91 kg/m2 Well developed and well nourished in no acute distress HENT normal E scleral and icterus clear Neck Supple JVP flat; carotids brisk and full Clear to ausculation Regular rate and rhythm, no murmurs gallops or rub Soft with active bowel sounds No clubbing cyanosis none Edema Alert and oriented, grossly normal motor and sensory function Skin Warm and Dry    Assessment and  Plan \

## 2012-03-17 NOTE — Assessment & Plan Note (Signed)
On guideline directed medical therapy continue current meds 

## 2012-03-17 NOTE — Assessment & Plan Note (Signed)
I have advised him to [place] driving is precluded for 6 months post VT in the setting of cardiomyopathy

## 2012-03-17 NOTE — Assessment & Plan Note (Signed)
The patient's device was interrogated.  The information was reviewed. No changes were made in the programming.    

## 2012-03-17 NOTE — Assessment & Plan Note (Signed)
No intercurrent Ventricular tachycardia requiring therapy there was a nonsustained episode. We have reviewed amiodarone side effects. Will check his TSH and LFTs today. We'll plan to decrease his dose from 400-200 mg a day and see him again in 3 months

## 2012-03-29 ENCOUNTER — Encounter: Payer: Self-pay | Admitting: Cardiovascular Disease

## 2012-04-24 ENCOUNTER — Encounter: Payer: Self-pay | Admitting: Cardiovascular Disease

## 2012-04-24 ENCOUNTER — Other Ambulatory Visit: Payer: Self-pay | Admitting: Cardiovascular Disease

## 2012-04-24 ENCOUNTER — Ambulatory Visit (INDEPENDENT_AMBULATORY_CARE_PROVIDER_SITE_OTHER): Payer: PRIVATE HEALTH INSURANCE | Admitting: Cardiovascular Disease

## 2012-04-24 VITALS — BP 118/82 | HR 57 | Ht 69.0 in | Wt 188.0 lb

## 2012-04-24 DIAGNOSIS — Z5181 Encounter for therapeutic drug level monitoring: Secondary | ICD-10-CM

## 2012-04-24 DIAGNOSIS — E785 Hyperlipidemia, unspecified: Secondary | ICD-10-CM

## 2012-04-24 DIAGNOSIS — Z9229 Personal history of other drug therapy: Secondary | ICD-10-CM

## 2012-04-24 DIAGNOSIS — I469 Cardiac arrest, cause unspecified: Secondary | ICD-10-CM

## 2012-04-24 DIAGNOSIS — I472 Ventricular tachycardia, unspecified: Secondary | ICD-10-CM

## 2012-04-24 DIAGNOSIS — R55 Syncope and collapse: Secondary | ICD-10-CM

## 2012-04-24 DIAGNOSIS — Z09 Encounter for follow-up examination after completed treatment for conditions other than malignant neoplasm: Secondary | ICD-10-CM

## 2012-04-24 DIAGNOSIS — I2510 Atherosclerotic heart disease of native coronary artery without angina pectoris: Secondary | ICD-10-CM

## 2012-04-24 DIAGNOSIS — I10 Essential (primary) hypertension: Secondary | ICD-10-CM

## 2012-04-24 LAB — COMPREHENSIVE METABOLIC PANEL
Alkaline Phosphatase: 95 U/L (ref 50–136)
Anion Gap: 8 (ref 7–16)
Bilirubin,Total: 0.4 mg/dL (ref 0.2–1.0)
Calcium, Total: 8.7 mg/dL (ref 8.5–10.1)
Chloride: 105 mmol/L (ref 98–107)
Co2: 29 mmol/L (ref 21–32)
Creatinine: 1.34 mg/dL — ABNORMAL HIGH (ref 0.60–1.30)
EGFR (Non-African Amer.): 58 — ABNORMAL LOW
Glucose: 115 mg/dL — ABNORMAL HIGH (ref 65–99)
Osmolality: 286 (ref 275–301)
Potassium: 4 mmol/L (ref 3.5–5.1)
SGPT (ALT): 27 U/L (ref 12–78)
Sodium: 142 mmol/L (ref 136–145)
Total Protein: 7.4 g/dL (ref 6.4–8.2)

## 2012-04-24 LAB — BILIRUBIN, DIRECT: Bilirubin, Direct: 0.1 mg/dL (ref 0.00–0.20)

## 2012-04-24 NOTE — Assessment & Plan Note (Signed)
Continue aggressive medical management. Currently with no symptoms of angina. No further workup at this time.

## 2012-04-24 NOTE — Progress Notes (Signed)
Patient ID: Ian Mills, male    DOB: 04-06-1956, 56 y.o.   MRN: [id number]  HPI Comments: [name] is a 56 year old gentleman with a history of coronary artery disease, 3 stents placed to his mid LAD, one stent to the PDA, one stent to the OM 3 in [place] 2008 with episodes of chest pressure during his divorce with stress test at [facility], followup cardiac catheterization at [facility], History of syncope x4 episodes total over the past several years.     In May, he had  ventricular tachycardia while in the emergency room while at work. He was diaphoretic, required resuscitation. He was transferred to [facility] and had a cardiac catheterization with no PCI. Chronically occluded LAD, moderately diseased RCA. He had defibrillator placed 12/01/2011 presumably given his large anterior scar and the VT requiring defibrillation in the emergency room.  Previous significant stressors with his divorce. He did not have short-term disability, reports that long-term disability has just started. He was recently living off $7 per day. He feels he is unable to go back to work given dizziness, shortness of breath, weakness. He was unable to continue cardiac rehabilitation secondary to cost.    MRI done of his heart in February 2008 showed anterior and anterolateral region was nonviable. Ejection fraction by catheterization at that time was 35%.   Cholesterol in February 2009 showed total cholesterol 173, LDL 107, HDL 42, triglycerides 121   His cardiac catheterization dated February 2008 documents 40% proximal RCA disease, 40% mid left circumflex, 80% mid LAD, small D2 and D3.   EKG shows normal sinus rhythm with rate 57 beats per minute, old anterior infarct, T wave abnormality in the anterolateral leads, left axis deviation    Outpatient Encounter Prescriptions as of 04/24/2012  Medication Sig Dispense Refill  . amiodarone (PACERONE) 200 MG tablet Take 1 tablet (200 mg total) by mouth daily.  30 tablet   11  . aspirin 81 MG EC tablet Take 81 mg by mouth daily.       Marland Kitchen atorvastatin (LIPITOR) 80 MG tablet Take 1 tablet (80 mg total) by mouth daily.  30 tablet  6  . carvedilol (COREG) 25 MG tablet Take 25 mg by mouth 2 (two) times daily with a meal.      . clopidogrel (PLAVIX) 75 MG tablet Take 75 mg by mouth daily.      Marland Kitchen ezetimibe (ZETIA) 10 MG tablet Take 1 tablet (10 mg total) by mouth daily.  30 tablet  6  . furosemide (LASIX) 40 MG tablet Take 40 mg by mouth daily.      . potassium chloride (KLOR-CON) 10 MEQ CR tablet Take 1 tablet (10 mEq total) by mouth daily.  90 tablet  3  . ramipril (ALTACE) 10 MG tablet Take 1 tablet (10 mg total) by mouth 2 (two) times daily.  60 tablet  2  . spironolactone (ALDACTONE) 12.5 mg TABS Take 12.5 mg by mouth daily.        Review of Systems  Constitutional: Negative.   HENT: Negative.   Eyes: Negative.   Respiratory: Negative.   Cardiovascular: Negative.   Gastrointestinal: Negative.   Musculoskeletal: Negative.   Skin: Negative.   Neurological: Negative.   Hematological: Negative.   Psychiatric/Behavioral: Negative.   All other systems reviewed and are negative.    BP 118/82  Pulse 57  Ht 5\' 9"  (1.753 m)  Wt 188 lb (85.276 kg)  BMI 27.76 kg/m2  Physical Exam  Nursing note  and vitals reviewed. Constitutional: He is oriented to person, place, and time. He appears well-developed and well-nourished.  HENT:  Head: Normocephalic.  Nose: Nose normal.  Mouth/Throat: Oropharynx is clear and moist.  Eyes: Conjunctivae normal are normal. Pupils are equal, round, and reactive to light.  Neck: Normal range of motion. Neck supple. No JVD present.  Cardiovascular: Normal rate, regular rhythm, S1 normal, S2 normal, normal heart sounds and intact distal pulses.  Exam reveals no gallop and no friction rub.   No murmur heard. Pulmonary/Chest: Effort normal and breath sounds normal. No respiratory distress. He has no wheezes. He has no rales. He  exhibits no tenderness.  Abdominal: Soft. Bowel sounds are normal. He exhibits no distension. There is no tenderness.  Musculoskeletal: Normal range of motion. He exhibits no edema and no tenderness.  Lymphadenopathy:    He has no cervical adenopathy.  Neurological: He is alert and oriented to person, place, and time. Coordination normal.  Skin: Skin is warm and dry. No rash noted. No erythema.  Psychiatric: He has a normal mood and affect. His behavior is normal. Judgment and thought content normal.           Assessment and Plan

## 2012-04-24 NOTE — Assessment & Plan Note (Signed)
He is having a difficult time recovering. Significant emotional issues and difficulty coping. Shortness of breath as well with exertion, currently not participating in rehabilitation secondary to expense. We have asked him to start walking more on his own for rehabilitation. He does not feel he can go back to work at this time.

## 2012-04-24 NOTE — Assessment & Plan Note (Signed)
Continue on statin and zetia. Goal LDL less than 70.

## 2012-04-24 NOTE — Patient Instructions (Addendum)
You are doing well. No medication changes were made.  We will check lab work at [facility] Continue your rehabilitation  Please call us if you have new issues that need to be addressed before your next appt.  Your physician wants you to follow-up in: 3 months.  You will receive a reminder letter in the mail two months in advance. If you don't receive a letter, please call our office to schedule the follow-up appointment.

## 2012-04-24 NOTE — Assessment & Plan Note (Signed)
Blood pressure is well controlled on today's visit. No changes made to the medications. 

## 2012-04-24 NOTE — Assessment & Plan Note (Signed)
Continue amiodarone and beta blocker. He continues to download from his ICD with close monitoring.

## 2012-04-28 ENCOUNTER — Encounter: Payer: Self-pay | Admitting: Cardiovascular Disease

## 2012-05-27 ENCOUNTER — Other Ambulatory Visit: Payer: Self-pay

## 2012-05-27 MED ORDER — RAMIPRIL 10 MG PO TABS: 10.0000 mg | ORAL_TABLET | Freq: Two times a day (BID) | ORAL | Status: DC

## 2012-05-27 MED ORDER — EZETIMIBE 10 MG PO TABS: 10.0000 mg | ORAL_TABLET | Freq: Every day | ORAL | Status: DC

## 2012-05-27 MED ORDER — POTASSIUM CHLORIDE ER 10 MEQ PO TBCR: 10.0000 meq | EXTENDED_RELEASE_TABLET | Freq: Every day | ORAL | Status: DC

## 2012-05-27 MED ORDER — ATORVASTATIN CALCIUM 80 MG PO TABS: 80.0000 mg | ORAL_TABLET | Freq: Every day | ORAL | Status: DC

## 2012-05-27 NOTE — Telephone Encounter (Signed)
Refill sent for klor con 10 meq take one tablet daily.

## 2012-05-27 NOTE — Telephone Encounter (Signed)
Refill sent for ramipril 10 mg one tablet twice a day.

## 2012-05-27 NOTE — Telephone Encounter (Signed)
Refill sent for atorvastatin 80 mg one tablet daily.

## 2012-05-29 ENCOUNTER — Encounter: Payer: Self-pay | Admitting: Cardiovascular Disease

## 2012-06-12 ENCOUNTER — Other Ambulatory Visit: Payer: Self-pay | Admitting: *Deleted

## 2012-06-12 MED ORDER — FUROSEMIDE 40 MG PO TABS: 40.0000 mg | ORAL_TABLET | Freq: Every day | ORAL | Status: DC

## 2012-06-12 NOTE — Telephone Encounter (Signed)
Refilled Furosemide. 

## 2012-06-23 ENCOUNTER — Ambulatory Visit (INDEPENDENT_AMBULATORY_CARE_PROVIDER_SITE_OTHER): Payer: PRIVATE HEALTH INSURANCE | Admitting: Internal Medicine

## 2012-06-23 ENCOUNTER — Encounter: Payer: Self-pay | Admitting: Internal Medicine

## 2012-06-23 VITALS — BP 100/60 | HR 66 | Ht 68.0 in | Wt 184.0 lb

## 2012-06-23 DIAGNOSIS — I255 Ischemic cardiomyopathy: Secondary | ICD-10-CM

## 2012-06-23 DIAGNOSIS — R55 Syncope and collapse: Secondary | ICD-10-CM

## 2012-06-23 DIAGNOSIS — I472 Ventricular tachycardia: Secondary | ICD-10-CM

## 2012-06-23 DIAGNOSIS — I2589 Other forms of chronic ischemic heart disease: Secondary | ICD-10-CM

## 2012-06-23 LAB — ICD DEVICE OBSERVATION
BRDY-0002RV: 40 {beats}/min
DEV-0020ICD: NEGATIVE
TZON-0003FASTVT: 352.9 ms
VENTRICULAR PACING ICD: 1 pct

## 2012-06-23 MED ORDER — AMIODARONE HCL 200 MG PO TABS: ORAL_TABLET | ORAL | Status: DC

## 2012-06-23 MED ORDER — RAMIPRIL 10 MG PO CAPS: 20.0000 mg | ORAL_CAPSULE | Freq: Every day | ORAL | Status: DC

## 2012-06-23 MED ORDER — CARVEDILOL 25 MG PO TABS: 12.5000 mg | ORAL_TABLET | Freq: Two times a day (BID) | ORAL | Status: DC

## 2012-06-23 MED ORDER — FUROSEMIDE 40 MG PO TABS: 20.0000 mg | ORAL_TABLET | Freq: Every day | ORAL | Status: DC

## 2012-06-23 NOTE — Patient Instructions (Addendum)
Your physician wants you to follow-up in: 3 months with device clinic and 6 months with Dr. Graciela Husbands. You will receive a reminder letter in the mail two months in advance. If you don't receive a letter, please call our office to schedule the follow-up appointment.  Your physician has recommended you make the following change in your medication:  -decrease amiodarone to 5 days a week -decrease carvedilol/coreg to 12.5 mg twice daily -decrease lasix(furosemide) to 20 mg daily -increase ramipril to 20 mg daily

## 2012-06-23 NOTE — Assessment & Plan Note (Signed)
These episodes, in the absence of demonstrated arrhythmia on his device, almost certainly neurally mediated. This is further supported by the residual symptoms of fatigue and diaphoresis. His blood pressure today is borderline. We will decrease his carvedilol, decrease his ALT his, and decrease his amiodarone. We will also have him try to adjust his Lasix from 40-20 using an extra 20 as needed. In the past for 40-20 adjustment was associated with reaccumulation of edema

## 2012-06-23 NOTE — Progress Notes (Signed)
HPI  Ian Mills is a 56 y.o. male Seen in followup for ICD implanted   May 2013 following presentation with ventricular tachycardia.  He has a history of ischemic heart disease with prior stenting of his LAD PDA and OM last in 2008. He underwent catheterization in May 2013 without further PCI. The details of that evaluation are not immediately available. MRI scanning 2008 demonstrated a large anterolateral region of non-viability ejection fraction was 35%.   He has also had concomitant therapy with amiodarone, which at the last visit was decreased from 400-200 mg a day. He is here today in followup for the reduced dose  Intercurrently he continues to have episodes of syncope these occur 3-4 times a year they're associated with very little warning. He has significant recovery fatigue lasting days as well as diaphoresis and flushing that last minutes to hours. There is residual orthostatic intolerance. They're frequently triggered by standing but not always.  He denies chest pain shortness of breath or peripheral edema    Past Medical History  Diagnosis Date  . Ischemic cardiomyopathy     s/p mi  multiiple stents  . HLD (hyperlipidemia)   . HTN (hypertension)   . Ventricular tachycardia 11/2011    s/p Defib implant   . ICD  single,BSX     DOI 11/2011    Past Surgical History  Procedure Date  . Carotid stent     x3 mid LAD, one stent PDA, one stent OM3 in 2/08  . Cardiac defibrillator placement May 2013  . Tumor removal     chest  . Appendectomy   . Cardiac catheterization Nov 30 2011    [facility]    Current Outpatient Prescriptions  Medication Sig Dispense Refill  . amiodarone (PACERONE) 200 MG tablet Take 1 tablet (200 mg total) by mouth daily.  30 tablet  11  . aspirin 81 MG EC tablet Take 81 mg by mouth daily.       Marland Kitchen atorvastatin (LIPITOR) 80 MG tablet Take 1 tablet (80 mg total) by mouth daily.  30 tablet  6  . carvedilol (COREG) 25 MG tablet Take 25 mg by mouth 2 (two)  times daily with a meal.      . clopidogrel (PLAVIX) 75 MG tablet Take 75 mg by mouth daily.      Marland Kitchen ezetimibe (ZETIA) 10 MG tablet Take 1 tablet (10 mg total) by mouth daily.  30 tablet  6  . furosemide (LASIX) 40 MG tablet Take 1 tablet (40 mg total) by mouth daily.  90 tablet  3  . potassium chloride (K-DUR) 10 MEQ tablet Take 1 tablet (10 mEq total) by mouth daily.  90 tablet  3  . ramipril (ALTACE) 10 MG tablet Take 1 tablet (10 mg total) by mouth 2 (two) times daily.  60 tablet  2  . spironolactone (ALDACTONE) 12.5 mg TABS Take 12.5 mg by mouth daily.        Allergies  Allergen Reactions  . Nitroglycerin     Nauseas    Review of Systems negative except from HPI and PMH  Physical Exam BP 100/60  Pulse 66  Ht 5\' 8"  (1.727 m)  Wt 184 lb (83.462 kg)  BMI 27.98 kg/m2 Well developed and well nourished in no acute distress HENT normal E scleral and icterus clear Neck Supple JVP flat; carotids brisk and full Clear to ausculation Regular rate and rhythm, S4 and 2/6 murmur heard best at the left lower sternal border Soft with active  bowel sounds No clubbing cyanosis none Edema Alert and oriented, grossly normal motor and sensory function Skin Warm and Dry    Assessment and  Plan \

## 2012-06-23 NOTE — Assessment & Plan Note (Signed)
The patient's device was interrogated.  The information was reviewed. No changes were made in the programming.    

## 2012-06-23 NOTE — Assessment & Plan Note (Signed)
Patient  is on amiodarone which we decreased last time from 400-200 mg a day.  Amiodarone surveillance laboratories 9/13 were normal. There has been no intercurrent ventricular tachycardia. We will further decrease his dose from 1400 mg a week-1000 mg a week.  Amiodarone can also aggravate orthostatic intolerance

## 2012-06-29 ENCOUNTER — Encounter: Payer: Self-pay | Admitting: Cardiovascular Disease

## 2012-07-29 HISTORY — PX: CORONARY ANGIOPLASTY: SHX604

## 2012-08-05 ENCOUNTER — Ambulatory Visit (INDEPENDENT_AMBULATORY_CARE_PROVIDER_SITE_OTHER): Payer: 59 | Admitting: Cardiovascular Disease

## 2012-08-05 ENCOUNTER — Encounter: Payer: Self-pay | Admitting: Cardiovascular Disease

## 2012-08-05 VITALS — BP 160/90 | HR 70 | Ht 68.0 in | Wt 189.0 lb

## 2012-08-05 DIAGNOSIS — R42 Dizziness and giddiness: Secondary | ICD-10-CM

## 2012-08-05 DIAGNOSIS — I472 Ventricular tachycardia, unspecified: Secondary | ICD-10-CM

## 2012-08-05 DIAGNOSIS — I2510 Atherosclerotic heart disease of native coronary artery without angina pectoris: Secondary | ICD-10-CM

## 2012-08-05 DIAGNOSIS — R55 Syncope and collapse: Secondary | ICD-10-CM

## 2012-08-05 DIAGNOSIS — E785 Hyperlipidemia, unspecified: Secondary | ICD-10-CM

## 2012-08-05 DIAGNOSIS — I255 Ischemic cardiomyopathy: Secondary | ICD-10-CM

## 2012-08-05 DIAGNOSIS — I2589 Other forms of chronic ischemic heart disease: Secondary | ICD-10-CM

## 2012-08-05 DIAGNOSIS — I10 Essential (primary) hypertension: Secondary | ICD-10-CM

## 2012-08-05 MED ORDER — AMLODIPINE BESYLATE 10 MG PO TABS: 10.0000 mg | ORAL_TABLET | Freq: Every day | ORAL | Status: DC

## 2012-08-05 NOTE — Patient Instructions (Addendum)
You are doing well. Please start amlodipine 5 mg once a day Monitor your blood pressure, If it continues to run high, increase the dose to a full pill.   Please call us if you have new issues that need to be addressed before your next appt.  Your physician wants you to follow-up in: 1 month.

## 2012-08-05 NOTE — Assessment & Plan Note (Signed)
Currently with no symptoms of angina. No further workup at this time. Continue current medication regimen. 

## 2012-08-05 NOTE — Assessment & Plan Note (Signed)
For his elevated blood pressure, we will start amlodipine 5 mg daily titrating up to 10 mg over the next week or 2 if needed.

## 2012-08-05 NOTE — Assessment & Plan Note (Signed)
No recent VT on ICD interrogation per the notes.

## 2012-08-05 NOTE — Progress Notes (Signed)
Patient ID: Ian Mills, male    DOB: Jun 21, 1956, 57 y.o.   MRN: [id number]  HPI Comments: Ian Mills is a 57 year old gentleman with a history of coronary artery disease, 3 stents placed to his mid LAD, one stent to the PDA, one stent to the OM 3 in February 2008 with episodes of chest pressure during his divorce with stress test at [facility], followup cardiac catheterization at [facility], History of syncope x4 episodes total over the past several years.  He is followed by Dr. Sherryl Manges for ICD.    May 2013, he had  ventricular tachycardia while in the emergency room while at work. He was diaphoretic, required resuscitation. He was transferred to [facility] and had a cardiac catheterization with no PCI. Chronically occluded LAD, moderately diseased RCA. He had defibrillator placed 12/01/2011 presumably given his large anterior scar and the VT requiring defibrillation in the emergency room.  He reports that he continues to feel very deconditioned, week and unable to go back to work. He has a new injury involving his left shoulder. He has not had this evaluated but wonders if it could be a rotator cuff injury. He has chronic shortness of breath with heavy exertion. Recent medications changes on his last office visit by Dr. Graciela Husbands.  He reports his blood pressure has been very elevated since that time, typically with systolic pressure greater than 160. No lightheadedness or syncope. Lasix dose was decreased by Dr. Graciela Husbands and he reports having to go back on the high dose for swelling in his hands. He does better on Lasix 40 mg daily. No change after taking low-dose amiodarone. He still receives his medications for an affordable price to [facility]   MRI done of his heart in February 2008 showed anterior and anterolateral region was nonviable. Ejection fraction by catheterization at that time was 35%.   Cholesterol in February 2009 showed total cholesterol 173, LDL 107, HDL 42, triglycerides 121   His  cardiac catheterization dated February 2008 documents 59% proximal RCA disease, 40% mid left circumflex, 80% mid LAD, small D2 and D3.   EKG shows normal sinus rhythm with rate 70 beats per minute, old anterior infarct, T wave abnormality in the anterolateral leads, left axis deviation    Outpatient Encounter Prescriptions as of 08/05/2012  Medication Sig Dispense Refill  . amiodarone (PACERONE) 200 MG tablet Take 1 tablet daily x 5 days a week  30 tablet  11  . aspirin 81 MG EC tablet Take 81 mg by mouth daily.       Marland Kitchen atorvastatin (LIPITOR) 80 MG tablet Take 1 tablet (80 mg total) by mouth daily.  30 tablet  6  . carvedilol (COREG) 25 MG tablet Take 0.5 tablets (12.5 mg total) by mouth 2 (two) times daily with a meal.  30 tablet  3  . clopidogrel (PLAVIX) 75 MG tablet Take 75 mg by mouth daily.      Marland Kitchen ezetimibe (ZETIA) 10 MG tablet Take 1 tablet (10 mg total) by mouth daily.  30 tablet  6  . furosemide (LASIX) 40 MG tablet Take 40 mg by mouth daily.      . potassium chloride (K-DUR) 10 MEQ tablet Take 1 tablet (10 mEq total) by mouth daily.  90 tablet  3  . ramipril (ALTACE) 10 MG capsule Take 2 capsules (20 mg total) by mouth daily.  60 capsule  3  . spironolactone (ALDACTONE) 12.5 mg TABS Take 12.5 mg by mouth daily.      . [  DISCONTINUED] furosemide (LASIX) 40 MG tablet Take 0.5 tablets (20 mg total) by mouth daily.  90 tablet  3    Review of Systems  Constitutional: Positive for fatigue.  HENT: Negative.   Eyes: Negative.   Respiratory: Positive for shortness of breath.   Cardiovascular: Negative.   Gastrointestinal: Negative.   Musculoskeletal: Negative.   Skin: Negative.   Neurological: Positive for weakness.  Hematological: Negative.   Psychiatric/Behavioral: Negative.   All other systems reviewed and are negative.    BP 160/90  Pulse 70  Ht 5\' 8"  (1.727 m)  Wt 189 lb (85.73 kg)  BMI 28.74 kg/m2  Physical Exam  Nursing note and vitals reviewed. Constitutional: He is  oriented to person, place, and time. He appears well-developed and well-nourished.  HENT:  Head: Normocephalic.  Nose: Nose normal.  Mouth/Throat: Oropharynx is clear and moist.  Eyes: Conjunctivae normal are normal. Pupils are equal, round, and reactive to light.  Neck: Normal range of motion. Neck supple. No JVD present.  Cardiovascular: Normal rate, regular rhythm, S1 normal, S2 normal, normal heart sounds and intact distal pulses.  Exam reveals no gallop and no friction rub.   No murmur heard. Pulmonary/Chest: Effort normal and breath sounds normal. No respiratory distress. He has no wheezes. He has no rales. He exhibits no tenderness.  Abdominal: Soft. Bowel sounds are normal. He exhibits no distension. There is no tenderness.  Musculoskeletal: Normal range of motion. He exhibits no edema and no tenderness.  Lymphadenopathy:    He has no cervical adenopathy.  Neurological: He is alert and oriented to person, place, and time. Coordination normal.  Skin: Skin is warm and dry. No rash noted. No erythema.  Psychiatric: He has a normal mood and affect. His behavior is normal. Judgment and thought content normal.           Assessment and Plan

## 2012-08-05 NOTE — Assessment & Plan Note (Signed)
No recent episodes of syncope or collapse.

## 2012-08-05 NOTE — Assessment & Plan Note (Signed)
Cholesterol is at goal on the current lipid regimen. No changes to the medications were made.  

## 2012-08-05 NOTE — Assessment & Plan Note (Signed)
He continues to feel poorly, has poor exercise tolerance and does not feel that he can go back to work. We've asked him to work on his conditioning, start exercising on a regular basis

## 2012-08-17 ENCOUNTER — Telehealth: Payer: Self-pay

## 2012-08-17 ENCOUNTER — Emergency Department: Payer: Self-pay | Admitting: Emergency Medicine

## 2012-08-17 LAB — CBC
HCT: 46.9 % (ref 40.0–52.0)
HGB: 16.2 g/dL (ref 13.0–18.0)
MCH: 30.7 pg (ref 26.0–34.0)
MCV: 89 fL (ref 80–100)
Platelet: 210 10*3/uL (ref 150–440)
RBC: 5.29 10*6/uL (ref 4.40–5.90)
RDW: 17.7 % — ABNORMAL HIGH (ref 11.5–14.5)

## 2012-08-17 LAB — BASIC METABOLIC PANEL
Calcium, Total: 9.5 mg/dL (ref 8.5–10.1)
Co2: 26 mmol/L (ref 21–32)
EGFR (Non-African Amer.): >60
Osmolality: 273 (ref 275–301)
Sodium: 135 mmol/L — ABNORMAL LOW (ref 136–145)

## 2012-08-17 NOTE — Telephone Encounter (Signed)
Patient calling to let us know going to the ER with left arm pain, dizziness, shortness of breath and left leg pain.

## 2012-09-09 ENCOUNTER — Encounter: Payer: Self-pay | Admitting: Cardiovascular Disease

## 2012-09-09 ENCOUNTER — Ambulatory Visit (INDEPENDENT_AMBULATORY_CARE_PROVIDER_SITE_OTHER): Payer: 59 | Admitting: Cardiovascular Disease

## 2012-09-09 VITALS — BP 140/88 | HR 73 | Ht 69.0 in | Wt 199.5 lb

## 2012-09-09 DIAGNOSIS — I255 Ischemic cardiomyopathy: Secondary | ICD-10-CM

## 2012-09-09 DIAGNOSIS — I2510 Atherosclerotic heart disease of native coronary artery without angina pectoris: Secondary | ICD-10-CM

## 2012-09-09 DIAGNOSIS — I472 Ventricular tachycardia: Secondary | ICD-10-CM

## 2012-09-09 DIAGNOSIS — E785 Hyperlipidemia, unspecified: Secondary | ICD-10-CM

## 2012-09-09 DIAGNOSIS — I2589 Other forms of chronic ischemic heart disease: Secondary | ICD-10-CM

## 2012-09-09 DIAGNOSIS — I10 Essential (primary) hypertension: Secondary | ICD-10-CM

## 2012-09-09 MED ORDER — HYDROCHLOROTHIAZIDE 25 MG PO TABS: 25.0000 mg | ORAL_TABLET | Freq: Every day | ORAL | Status: DC

## 2012-09-09 NOTE — Patient Instructions (Addendum)
You are doing well. Please start HCTZ one a day 30 minutes before the lasix If edema resolves, cut back on the lasix in 1/2   We will check you kidney, lipids, potassium, liver in 3 to 4 weeks  Please call us if you have new issues that need to be addressed before your next appt.  Your physician wants you to follow-up in: 3 months.  You will receive a reminder letter in the mail two months in advance. If you don't receive a letter, please call our office to schedule the follow-up appointment.

## 2012-09-10 NOTE — Assessment & Plan Note (Signed)
For his blood pressure, we will start HCTZ 25 mg daily with blood pressure check over the next several weeks also basic metabolic panel in several weeks' time.

## 2012-09-10 NOTE — Progress Notes (Signed)
Patient ID: Ian Mills, male    DOB: [birth year], 57 y.o.   MRN: [id number]  HPI Comments: Ian Mills is a 57 year old gentleman with a history of coronary artery disease, 3 stents placed to his mid LAD, one stent to the PDA, one stent to the OM 3 in February 2008 with episodes of chest pressure during his divorce with stress test at [facility], followup cardiac catheterization at [facility], History of syncope x4 episodes total over the past several years.  He is followed by Dr. Sherryl Manges for ICD.    May 2013, he had  ventricular tachycardia while in the emergency room while at work. He was diaphoretic, required resuscitation. He was transferred to [facility] and had a cardiac catheterization with no PCI. Chronically occluded LAD, moderately diseased RCA. He had defibrillator placed 12/01/2011 presumably given his large anterior scar and the VT requiring defibrillation in the emergency room.   He continues to feel very deconditioned, weak and unable to go back to work. Continued pain of his left shoulder following a previous injury. Thinks it is rotator cuff injury. He has chronic shortness of breath with moderate exertion.  Amlodipine added on his last clinic visit and he reports his blood pressure continues to be elevated. He requires high-dose diuretics daily and if he stops, he has worsening leg edema and shortness of breath. Currently feels stable though "does not feel right with his breathing".   Emergency room visit 08/17/2012 for chest pain. Cardiac enzymes negative. Discharged home   MRI done of his heart in February 2008 showed anterior and anterolateral region was nonviable. Ejection fraction by catheterization at that time was 35%.   His cardiac catheterization dated February 2008 documents 40% proximal RCA disease, 40% mid left circumflex, 80% mid LAD, small D2 and D3.   EKG shows normal sinus rhythm with rate 73 beats per minute, old anterior infarct, T wave abnormality in the  anterolateral leads, left axis deviation    Outpatient Encounter Prescriptions as of 09/09/2012  Medication Sig Dispense Refill  . amiodarone (PACERONE) 200 MG tablet Take 1 tablet daily x 5 days a week  30 tablet  11  . amLODipine (NORVASC) 10 MG tablet Take 1 tablet (10 mg total) by mouth daily.  30 tablet  6  . aspirin 81 MG EC tablet Take 81 mg by mouth daily.       Marland Kitchen atorvastatin (LIPITOR) 80 MG tablet Take 1 tablet (80 mg total) by mouth daily.  30 tablet  6  . carvedilol (COREG) 25 MG tablet Take 0.5 tablets (12.5 mg total) by mouth 2 (two) times daily with a meal.  30 tablet  3  . clopidogrel (PLAVIX) 75 MG tablet Take 75 mg by mouth daily.      Marland Kitchen ezetimibe (ZETIA) 10 MG tablet Take 1 tablet (10 mg total) by mouth daily.  30 tablet  6  . furosemide (LASIX) 40 MG tablet Take 40 mg by mouth daily.      . potassium chloride (K-DUR) 10 MEQ tablet Take 1 tablet (10 mEq total) by mouth daily.  90 tablet  3  . ramipril (ALTACE) 10 MG capsule Take 2 capsules (20 mg total) by mouth daily.  60 capsule  3  . spironolactone (ALDACTONE) 12.5 mg TABS Take 12.5 mg by mouth daily.       No facility-administered encounter medications on file as of 09/09/2012.     Review of Systems  Constitutional: Positive for fatigue.  HENT: Negative.  Eyes: Negative.   Respiratory: Positive for shortness of breath.   Cardiovascular: Negative.   Gastrointestinal: Negative.   Musculoskeletal: Negative.   Skin: Negative.   Neurological: Positive for weakness.  Psychiatric/Behavioral: Negative.   All other systems reviewed and are negative.    BP 140/88  Pulse 73  Ht 5\' 9"  (1.753 m)  Wt 199 lb 8 oz (90.493 kg)  BMI 29.45 kg/m2  Physical Exam  Nursing note and vitals reviewed. Constitutional: He is oriented to person, place, and time. He appears well-developed and well-nourished.  HENT:  Head: Normocephalic.  Nose: Nose normal.  Mouth/Throat: Oropharynx is clear and moist.  Eyes: Conjunctivae are  normal. Pupils are equal, round, and reactive to light.  Neck: Normal range of motion. Neck supple. No JVD present.  Cardiovascular: Normal rate, regular rhythm, S1 normal, S2 normal, normal heart sounds and intact distal pulses.  Exam reveals no gallop and no friction rub.   No murmur heard. Pulmonary/Chest: Effort normal and breath sounds normal. No respiratory distress. He has no wheezes. He has no rales. He exhibits no tenderness.  Abdominal: Soft. Bowel sounds are normal. He exhibits no distension. There is no tenderness.  Musculoskeletal: Normal range of motion. He exhibits no edema and no tenderness.  Lymphadenopathy:    He has no cervical adenopathy.  Neurological: He is alert and oriented to person, place, and time. Coordination normal.  Skin: Skin is warm and dry. No rash noted. No erythema.  Psychiatric: He has a normal mood and affect. His behavior is normal. Judgment and thought content normal.      Assessment and Plan

## 2012-09-10 NOTE — Assessment & Plan Note (Signed)
Severe underlying disease, ICD in place. Feels he has continued shortness of breath and fluid overload. Will add HCTZ with his Lasix

## 2012-09-10 NOTE — Assessment & Plan Note (Signed)
ICD in place. Per the notes, no recent episodes, continue amiodarone

## 2012-09-10 NOTE — Assessment & Plan Note (Signed)
Continue current aggressive cholesterol management

## 2012-10-05 ENCOUNTER — Other Ambulatory Visit: Payer: Self-pay

## 2012-10-05 NOTE — Telephone Encounter (Signed)
Patient needs a prior authorization for zetia.

## 2012-10-07 ENCOUNTER — Encounter: Payer: Self-pay | Admitting: *Deleted

## 2012-10-07 ENCOUNTER — Telehealth: Payer: Self-pay | Admitting: *Deleted

## 2012-10-07 NOTE — Telephone Encounter (Signed)
lmtcb concerning zetia prior authorization.

## 2012-10-08 ENCOUNTER — Telehealth: Payer: Self-pay | Admitting: *Deleted

## 2012-10-08 NOTE — Telephone Encounter (Signed)
Faxed prior authorization forms for pt to receive zetia, still awaiting approval.

## 2012-10-08 NOTE — Telephone Encounter (Signed)
x2 Left call back number and contacted pt's daughter to try to get a hold of Ian Mills. Pt has not returned call and daughter's phone is disconnected reason for call is concerning prior authorization for zetia.

## 2012-10-13 ENCOUNTER — Telehealth: Payer: Self-pay | Admitting: *Deleted

## 2012-10-13 ENCOUNTER — Telehealth: Payer: Self-pay

## 2012-10-13 NOTE — Telephone Encounter (Signed)
See below

## 2012-10-13 NOTE — Telephone Encounter (Signed)
Spoke with Ian Mills with [employer] to check on status of Zetia authorization. He mentioned that Zetia authorization is still under further review. They will either fax or call us to let us know once authorization is approved.

## 2012-10-13 NOTE — Telephone Encounter (Signed)
Spoke with Mr. Luba and he is aware that Zetia authorization has not been approved and that I will leave zetia samples upfront until pt is able to be approved for pick up.

## 2012-10-13 NOTE — Telephone Encounter (Signed)
Pt has changed insurance co does not cover and needs Zetia samples. Please call

## 2012-10-13 NOTE — Telephone Encounter (Signed)
Pt states # has changed to [phone]

## 2012-10-14 ENCOUNTER — Other Ambulatory Visit: Payer: Self-pay | Admitting: Cardiovascular Disease

## 2012-10-14 ENCOUNTER — Encounter: Payer: Self-pay | Admitting: Cardiovascular Disease

## 2012-10-14 ENCOUNTER — Ambulatory Visit (INDEPENDENT_AMBULATORY_CARE_PROVIDER_SITE_OTHER): Payer: BC Managed Care – PPO | Admitting: *Deleted

## 2012-10-14 ENCOUNTER — Encounter: Payer: Self-pay | Admitting: Internal Medicine

## 2012-10-14 ENCOUNTER — Other Ambulatory Visit: Payer: Self-pay

## 2012-10-14 ENCOUNTER — Telehealth: Payer: Self-pay

## 2012-10-14 LAB — ICD DEVICE OBSERVATION
BRDY-0002RV: 40 {beats}/min
DEV-0020ICD: NEGATIVE
DEVICE MODEL ICD: 105232

## 2012-10-14 LAB — BASIC METABOLIC PANEL
Anion Gap: 7 (ref 7–16)
BUN: 18 mg/dL (ref 7–18)
Calcium, Total: 8.9 mg/dL (ref 8.5–10.1)
Chloride: 101 mmol/L (ref 98–107)
Co2: 29 mmol/L (ref 21–32)
Creatinine: 1.09 mg/dL (ref 0.60–1.30)
EGFR (African American): >60
Sodium: 137 mmol/L (ref 136–145)

## 2012-10-14 LAB — HEPATIC FUNCTION PANEL A (ARMC)
Bilirubin, Direct: 0.1 mg/dL (ref 0.00–0.20)
SGOT(AST): 20 U/L (ref 15–37)
SGPT (ALT): 26 U/L (ref 12–78)
Total Protein: 8 g/dL (ref 6.4–8.2)

## 2012-10-14 LAB — LIPID PANEL: Cholesterol: 151 mg/dL (ref 0–200)

## 2012-10-14 NOTE — Telephone Encounter (Signed)
Notified insurance company authorization still in process and just waiting to here back from them.

## 2012-10-14 NOTE — Telephone Encounter (Signed)
lmtcb ZO:XWRU to be done at [facility]

## 2012-10-14 NOTE — Progress Notes (Signed)
ICD check 

## 2012-10-14 NOTE — Telephone Encounter (Signed)
Pt called back Says he is having labs checked today at [facility]

## 2012-10-15 NOTE — Telephone Encounter (Signed)
Spoke with Raynelle Fanning from [employer] concerning zetia prior authorization. Reference # [id number]. We have received fax from [facility] that Zetia has been approved from 10/05/12-10/14/17. She mentioned that it has not cleared through and she will contact insurance company to get it cleared just to make sure.

## 2012-10-16 NOTE — Telephone Encounter (Signed)
LMOM to have patient call regarding the medication for zetia; told patient has been approved but will cost $60.00 for the copay.

## 2012-10-19 ENCOUNTER — Telehealth: Payer: Self-pay

## 2012-10-19 NOTE — Telephone Encounter (Signed)
Pt called and needs lab results

## 2012-10-19 NOTE — Telephone Encounter (Signed)
error 

## 2012-10-19 NOTE — Telephone Encounter (Signed)
Please see lab results.

## 2012-10-20 NOTE — Telephone Encounter (Signed)
Normal labwork

## 2012-10-21 NOTE — Telephone Encounter (Signed)
Results given to pt 3/25

## 2012-10-28 ENCOUNTER — Telehealth: Payer: Self-pay

## 2012-10-28 MED ORDER — EZETIMIBE 10 MG PO TABS: 10.0000 mg | ORAL_TABLET | Freq: Every day | ORAL | Status: DC

## 2012-10-28 NOTE — Telephone Encounter (Signed)
Refill sent for zetia.  

## 2012-11-17 ENCOUNTER — Other Ambulatory Visit: Payer: Self-pay | Admitting: *Deleted

## 2012-11-17 MED ORDER — RAMIPRIL 10 MG PO CAPS: 20.0000 mg | ORAL_CAPSULE | Freq: Every day | ORAL | Status: DC

## 2012-11-17 NOTE — Telephone Encounter (Signed)
Refilled Ramipril sent to [facility].

## 2012-12-01 ENCOUNTER — Telehealth: Payer: Self-pay

## 2012-12-01 NOTE — Telephone Encounter (Signed)
Request from [facility] Disability Determination Services , sent to [employer] on 12/01/2012.

## 2012-12-14 ENCOUNTER — Encounter: Payer: Self-pay | Admitting: Cardiovascular Disease

## 2012-12-14 ENCOUNTER — Ambulatory Visit (INDEPENDENT_AMBULATORY_CARE_PROVIDER_SITE_OTHER): Payer: BC Managed Care – PPO | Admitting: Cardiovascular Disease

## 2012-12-14 VITALS — BP 98/58 | HR 64 | Ht 68.0 in | Wt 189.8 lb

## 2012-12-14 DIAGNOSIS — I472 Ventricular tachycardia: Secondary | ICD-10-CM

## 2012-12-14 DIAGNOSIS — E785 Hyperlipidemia, unspecified: Secondary | ICD-10-CM

## 2012-12-14 DIAGNOSIS — I959 Hypotension, unspecified: Secondary | ICD-10-CM

## 2012-12-14 DIAGNOSIS — I2510 Atherosclerotic heart disease of native coronary artery without angina pectoris: Secondary | ICD-10-CM

## 2012-12-14 DIAGNOSIS — I10 Essential (primary) hypertension: Secondary | ICD-10-CM

## 2012-12-14 NOTE — Patient Instructions (Addendum)
Please cut the amlodipine in 1/2 daily If the blood pressure continues to run low, hold the amlodipine  We will check your ICD today to look for causes of your black out spells  Please call us if you have new issues that need to be addressed before your next appt.  Your physician wants you to follow-up in: 6 months.  You will receive a reminder letter in the mail two months in advance. If you don't receive a letter, please call our office to schedule the follow-up appointment.

## 2012-12-14 NOTE — Assessment & Plan Note (Signed)
Low blood pressure at today's visit. Given recent episode of near syncope, we will decrease his amlodipine down to 5 mg daily. If symptoms persist, we'll hold his amlodipine. No arrhythmia noted 2 days ago.

## 2012-12-14 NOTE — Assessment & Plan Note (Signed)
Short asymptomatic run of nonsustained VT in April. We'll continue carvedilol twice a day

## 2012-12-14 NOTE — Assessment & Plan Note (Signed)
Encouraged him to stay on his statin and zetia

## 2012-12-14 NOTE — Progress Notes (Signed)
Patient ID: Reason Ian Mills, male    DOB: 04-Jul-1956, 57 y.o.   MRN: [id number]  HPI Comments: [name] is a 57 year old gentleman with a history of coronary artery disease, 3 stents placed to his mid LAD, one stent to the PDA, one stent to the OM 3 in February 2008 with episodes of chest pressure during his divorce with stress test at [facility], followup cardiac catheterization at [facility], History of syncope x4 episodes total over the past several years.  He is followed by Dr. Sherryl Manges for ICD.    May 2013, he had  ventricular tachycardia while in the emergency room while at work. He was diaphoretic, required resuscitation. He was transferred to [facility] and had a cardiac catheterization with no PCI. Chronically occluded LAD, moderately diseased RCA. He had defibrillator placed 12/01/2011 presumably given his large anterior scar and the VT requiring defibrillation in the emergency room.   He continues to feel very deconditioned, weak and unable to go back to work. Continued pain of his left shoulder and chronic shortness of breath with moderate exertion.  Emergency room visit 08/17/2012 for chest pain. Cardiac enzymes negative. Discharged home  He reports recent episode of nausea, lightheadedness, near syncope. Evaluation of his ICD in the office today on preliminary evaluation did not show any arrhythmia on the day of symptoms described which was 2 days ago. He did have run of nonsustained VT on 11/06/2012   MRI done of his heart in February 2008 showed anterior and anterolateral region was nonviable. Ejection fraction by catheterization at that time was 35%.   His cardiac catheterization dated February 2008 documents 40% proximal RCA disease, 40% mid left circumflex, 80% mid LAD, small D2 and D3.   EKG shows normal sinus rhythm with rate 58 beats per minute, old anterior infarct, T wave abnormality in the anterolateral leads, left axis deviation   Outpatient Encounter  Prescriptions as of 12/14/2012  Medication Sig Dispense Refill  . amiodarone (PACERONE) 200 MG tablet Take 1 tablet daily x 5 days a week  30 tablet  11  . amLODipine (NORVASC) 10 MG tablet Take 1 tablet (10 mg total) by mouth daily.  30 tablet  6  . aspirin 81 MG EC tablet Take 81 mg by mouth daily.       Marland Kitchen atorvastatin (LIPITOR) 80 MG tablet Take 1 tablet (80 mg total) by mouth daily.  30 tablet  6  . carvedilol (COREG) 25 MG tablet Take 0.5 tablets (12.5 mg total) by mouth 2 (two) times daily with a meal.  30 tablet  3  . clopidogrel (PLAVIX) 75 MG tablet Take 75 mg by mouth daily.      Marland Kitchen ezetimibe (ZETIA) 10 MG tablet Take 1 tablet (10 mg total) by mouth daily.  30 tablet  6  . furosemide (LASIX) 40 MG tablet Take 40 mg by mouth daily.      . hydrochlorothiazide (HYDRODIURIL) 25 MG tablet Take 1 tablet (25 mg total) by mouth daily.  30 tablet  6  . potassium chloride (K-DUR) 10 MEQ tablet Take 1 tablet (10 mEq total) by mouth daily.  90 tablet  3  . ramipril (ALTACE) 10 MG capsule Take 2 capsules (20 mg total) by mouth daily.  60 capsule  3  . spironolactone (ALDACTONE) 12.5 mg TABS Take 12.5 mg by mouth daily.         Review of Systems  Constitutional: Positive for fatigue.  HENT: Negative.   Eyes: Negative.  Respiratory: Positive for shortness of breath.   Cardiovascular: Negative.   Gastrointestinal: Negative.   Musculoskeletal: Negative.   Skin: Negative.   Neurological: Positive for weakness.  Psychiatric/Behavioral: Negative.   All other systems reviewed and are negative.    BP 98/58  Pulse 64  Ht 5\' 8"  (1.727 m)  Wt 189 lb 12 oz (86.07 kg)  BMI 28.86 kg/m2  Physical Exam  Nursing note and vitals reviewed. Constitutional: He is oriented to person, place, and time. He appears well-developed and well-nourished.  HENT:  Head: Normocephalic.  Nose: Nose normal.  Mouth/Throat: Oropharynx is clear and moist.  Eyes: Conjunctivae are normal. Pupils are equal, round, and  reactive to light.  Neck: Normal range of motion. Neck supple. No JVD present.  Cardiovascular: Normal rate, regular rhythm, S1 normal, S2 normal, normal heart sounds and intact distal pulses.  Exam reveals no gallop and no friction rub.   No murmur heard. Pulmonary/Chest: Effort normal and breath sounds normal. No respiratory distress. He has no wheezes. He has no rales. He exhibits no tenderness.  Abdominal: Soft. Bowel sounds are normal. He exhibits no distension. There is no tenderness.  Musculoskeletal: Normal range of motion. He exhibits no edema and no tenderness.  Lymphadenopathy:    He has no cervical adenopathy.  Neurological: He is alert and oriented to person, place, and time. Coordination normal.  Skin: Skin is warm and dry. No rash noted. No erythema.  Psychiatric: He has a normal mood and affect. His behavior is normal. Judgment and thought content normal.      Assessment and Plan

## 2012-12-14 NOTE — Assessment & Plan Note (Signed)
Currently with no symptoms of angina. No further workup at this time. Continue current medication regimen. 

## 2012-12-16 ENCOUNTER — Ambulatory Visit (INDEPENDENT_AMBULATORY_CARE_PROVIDER_SITE_OTHER): Payer: BC Managed Care – PPO

## 2012-12-16 VITALS — BP 106/60 | HR 64

## 2012-12-16 DIAGNOSIS — R42 Dizziness and giddiness: Secondary | ICD-10-CM

## 2012-12-16 MED ORDER — HYDROCHLOROTHIAZIDE 25 MG PO TABS: 25.0000 mg | ORAL_TABLET | ORAL | Status: DC | PRN

## 2012-12-16 NOTE — Patient Instructions (Addendum)
Your physician has recommended you make the following change in your medication:  -stop amlodipine -only take HCTZ as needed for swelling/shortness of breath.  Stop taking this daily -do not take furosemide/lasix today or tomorrow.  You may resume this Friday 5/23  Call us should symptoms continue/get worse

## 2012-12-16 NOTE — Progress Notes (Signed)
Pt's psychiatrist called Dr. Mariah Milling this am to report pt had another "blackout spell" and asked that we see pt Per Dr. Mariah Milling, he advised we check orthostatics on pt and do another quick look of ICD device to make sure he has had no episodes  Pt says he took meds yesterday am. He was driving when he began to feel he was going to "pass out" He pulled in to gas station and got out of car to go inside to get some water Says he "blacked out" at front door and hurt knee and arm His dtr came to pick him up.  He did not go to ER  He tells me he is feeling well today, denies dizziness or near syncope But tells me he held all meds this am. He has had no BP meds today  Orthostatics were obtained: 106/60, HR=68 supine 110/60, HR=68 sitting 118/60, HR=80 standing 116/60, HR=76 standing  Pt denied symptoms of dizziness at anytime during orthostatics Denies CP, sob EKG was also obtained Will review with Dr.Gollan  I discussed with Dr. Mariah Milling who gave orders as follows:"Stop amlodipine.  May take HCTZ only as needed.  Hold lasix today and tomorrow then resume.  If he continues to experience dizziness, he should let us know" V.O Dr. Alvis Lemmings, RN

## 2012-12-22 ENCOUNTER — Telehealth: Payer: Self-pay

## 2012-12-22 NOTE — Telephone Encounter (Signed)
See below

## 2012-12-22 NOTE — Telephone Encounter (Signed)
Message copied by Piper Albro E on Tue Dec 22, 2012  1:55 PM ------      Message from: GOLLAN, TIMOTHY J      Created: Tue Dec 22, 2012  1:26 PM       Can we call to make sure that he is feeling better? Thanks                              ----- Message -----         From: Adain Geurin E Jackalyn Haith, RN         Sent: 12/16/2012  11:28 AM           To: Timothy J Gollan, MD                   ------ 

## 2012-12-22 NOTE — Telephone Encounter (Signed)
Pt reports he is feeling much better since stopping amlodipine and changing HCTZ to PRN. Continues to take lasix as prescribed Denies further dizzy spells and says BP has improved He will call us should symptoms return I will make Dr. Mariah Milling aware

## 2012-12-22 NOTE — Telephone Encounter (Signed)
Message copied by Marcelle Overlie on Tue Dec 22, 2012  1:55 PM ------      Message from: Antonieta Iba      Created: Tue Dec 22, 2012  1:26 PM       Can we call to make sure that he is feeling better? Thanks                              ----- Message -----         From: Marcelle Overlie, RN         Sent: 12/16/2012  11:28 AM           To: Antonieta Iba, MD                   ------

## 2013-01-03 ENCOUNTER — Other Ambulatory Visit: Payer: Self-pay | Admitting: Cardiovascular Disease

## 2013-01-04 ENCOUNTER — Other Ambulatory Visit: Payer: Self-pay | Admitting: *Deleted

## 2013-01-04 MED ORDER — ATORVASTATIN CALCIUM 80 MG PO TABS: 80.0000 mg | ORAL_TABLET | Freq: Every day | ORAL | Status: DC

## 2013-01-04 NOTE — Telephone Encounter (Signed)
Refilled Atorvastatin sent to [facility].

## 2013-01-21 ENCOUNTER — Ambulatory Visit (INDEPENDENT_AMBULATORY_CARE_PROVIDER_SITE_OTHER): Payer: BC Managed Care – HMO | Admitting: Internal Medicine

## 2013-01-21 ENCOUNTER — Encounter: Payer: Self-pay | Admitting: Internal Medicine

## 2013-01-21 ENCOUNTER — Telehealth: Payer: Self-pay

## 2013-01-21 VITALS — BP 98/62 | HR 60 | Ht 69.0 in | Wt 190.0 lb

## 2013-01-21 DIAGNOSIS — I472 Ventricular tachycardia: Secondary | ICD-10-CM

## 2013-01-21 DIAGNOSIS — Z95810 Presence of automatic (implantable) cardiac defibrillator: Secondary | ICD-10-CM

## 2013-01-21 DIAGNOSIS — I959 Hypotension, unspecified: Secondary | ICD-10-CM

## 2013-01-21 DIAGNOSIS — I2510 Atherosclerotic heart disease of native coronary artery without angina pectoris: Secondary | ICD-10-CM

## 2013-01-21 DIAGNOSIS — I255 Ischemic cardiomyopathy: Secondary | ICD-10-CM

## 2013-01-21 DIAGNOSIS — R55 Syncope and collapse: Secondary | ICD-10-CM

## 2013-01-21 DIAGNOSIS — I2589 Other forms of chronic ischemic heart disease: Secondary | ICD-10-CM

## 2013-01-21 LAB — ICD DEVICE OBSERVATION
DEV-0020ICD: NEGATIVE
RV LEAD AMPLITUDE: 19.5 mv
RV LEAD IMPEDENCE ICD: 602 Ohm
RV LEAD THRESHOLD: 0.8 V

## 2013-01-21 MED ORDER — FUROSEMIDE 40 MG PO TABS: 20.0000 mg | ORAL_TABLET | Freq: Every day | ORAL | Status: DC

## 2013-01-21 MED ORDER — CARVEDILOL 3.125 MG PO TABS: 3.1250 mg | ORAL_TABLET | Freq: Two times a day (BID) | ORAL | Status: DC

## 2013-01-21 MED ORDER — RAMIPRIL 5 MG PO CAPS: 5.0000 mg | ORAL_CAPSULE | Freq: Every day | ORAL | Status: DC

## 2013-01-21 NOTE — Progress Notes (Signed)
Patient has no care team.   HPI  Ian Mills is a 57 y.o. male Seen in followup for ICD implanted May 2013 following presentation with ventricular tachycardia for which he takes amiodarone He has a history of ischemic heart disease with prior stenting of his LAD PDA and OM last in 2008. He underwent catheterization in May 2013 without further PCI. The details of that evaluation are not immediately available. MRI scanning 2008 demonstrated a large anterolateral region of non-viability ejection fraction was 35%.   He continues to have episodes of syncope. Interrogation of his device demonstrates no arrhythmia. They have a stereotypical prodrome as well as recovery phase including fatigue diaphoresis and residual orthostatic intolerance  His blood pressure has been quite low      Past Medical History  Diagnosis Date  . Ischemic cardiomyopathy     s/p mi  multiiple stents  . HLD (hyperlipidemia)   . HTN (hypertension)   . Ventricular tachycardia 11/2011    s/p Defib implant   . ICD  single,BSX     DOI 11/2011    Past Surgical History  Procedure Laterality Date  . Carotid stent      x3 mid LAD, one stent PDA, one stent OM3 in 2/08  . Cardiac defibrillator placement  May 2013  . Tumor removal      chest  . Appendectomy    . Cardiac catheterization  Nov 30 2011    [facility]    Current Outpatient Prescriptions  Medication Sig Dispense Refill  . amiodarone (PACERONE) 200 MG tablet Take 1 tablet daily x 5 days a week  30 tablet  11  . aspirin 81 MG EC tablet Take 81 mg by mouth daily.       Marland Kitchen atorvastatin (LIPITOR) 80 MG tablet Take 1 tablet (80 mg total) by mouth daily.  30 tablet  6  . carvedilol (COREG) 25 MG tablet Take 0.5 tablets (12.5 mg total) by mouth 2 (two) times daily with a meal.  30 tablet  3  . clopidogrel (PLAVIX) 75 MG tablet Take 75 mg by mouth daily.      Marland Kitchen ezetimibe (ZETIA) 10 MG tablet Take 1 tablet (10 mg total) by mouth daily.  30 tablet  6  . furosemide (LASIX)  40 MG tablet Take 40 mg by mouth daily.      . hydrochlorothiazide (HYDRODIURIL) 25 MG tablet Take 25 mg by mouth every other day.      . potassium chloride (K-DUR) 10 MEQ tablet Take 1 tablet (10 mEq total) by mouth daily.  90 tablet  3  . ramipril (ALTACE) 10 MG capsule Take 2 capsules (20 mg total) by mouth daily.  60 capsule  3  . sertraline (ZOLOFT) 100 MG tablet Take 100 mg by mouth daily.       Marland Kitchen spironolactone (ALDACTONE) 12.5 mg TABS Take 12.5 mg by mouth daily.      Marland Kitchen zolpidem (AMBIEN) 10 MG tablet Take 10 mg by mouth at bedtime as needed.        No current facility-administered medications for this visit.    Allergies  Allergen Reactions  . Nitroglycerin     Nauseas    Review of Systems negative except from HPI and PMH  Physical Exam BP 98/62  Pulse 60  Ht 5\' 9"  (1.753 m)  Wt 190 lb (86.183 kg)  BMI 28.05 kg/m2 Well developed and well nourished in no acute distress HENT normal E scleral and icterus clear Neck Supple JVP  flat; carotids brisk and full Clear to ausculation Device pocket well healed; without hematoma or erythema  Regular rate and rhythm, no murmurs gallops or rub Soft with active bowel sounds No clubbing cyanosis none Edema Alert and oriented, grossly normal motor and sensory function Skin Warm and Dry  ECG demonstrates sinus rhythm at 57 Intervals 18/09/44 Prior anteroseptal MI Low-voltage  Assessment and  Plan

## 2013-01-21 NOTE — Assessment & Plan Note (Signed)
There is no associated arrhythmia. These are neurally mediated. It is possible the amiodarone is aggravating it. We will continue him on low-dose amiodarone for now. He also has low blood pressure and we will decrease his medications today dropping his carvedilol from 12.5--3.125 and his ramipril  from 20--10

## 2013-01-21 NOTE — Assessment & Plan Note (Signed)
The patient's device was interrogated.  The information was reviewed. No changes were made in the programming.    

## 2013-01-21 NOTE — Telephone Encounter (Signed)
Pt asking for samples of Cialis I did not see this on his medication list Says we gave him samples at last office visit with Dr. Mariah Milling I explained this is not documented in chart so I will have to discuss with Dr. Mariah Milling and call him if we have samples Understanding verb

## 2013-01-21 NOTE — Assessment & Plan Note (Signed)
As above.

## 2013-01-21 NOTE — Assessment & Plan Note (Signed)
Stable

## 2013-01-21 NOTE — Assessment & Plan Note (Signed)
.  novt No intercurrent Ventricular tachycardia

## 2013-01-21 NOTE — Patient Instructions (Addendum)
Your physician wants you to follow-up in: 4-6 weeks with Dr. Graciela Husbands. You will receive a reminder letter in the mail two months in advance. If you don't receive a letter, please call our office to schedule the follow-up appointment.  Your physician has recommended you make the following change in your medication:  -decrease carvedilol to 3.125 mg twice daily -decrease lasix to 20 mg daily -decrease altace/ramipril to 5 mg daily

## 2013-01-25 NOTE — Telephone Encounter (Signed)
Should be ok Could get coupon online\ 5mg  x 30

## 2013-01-26 ENCOUNTER — Other Ambulatory Visit: Payer: Self-pay

## 2013-01-26 MED ORDER — TADALAFIL 5 MG PO TABS: 5.0000 mg | ORAL_TABLET | Freq: Every day | ORAL | Status: DC | PRN

## 2013-01-26 NOTE — Telephone Encounter (Signed)
Coupon printed for pt Will leave at [employer] sent to pharm Pt informed

## 2013-02-02 ENCOUNTER — Telehealth: Payer: Self-pay

## 2013-02-02 NOTE — Telephone Encounter (Signed)
Notified patient in order for the insurance to cover the cialis needs to have documentation of a BPH problem. Told the patient would need to have a primary care physician or a urologist test for this. The patient will call back if has any further questions. He was told to call also if needs some samples.

## 2013-03-09 ENCOUNTER — Ambulatory Visit (INDEPENDENT_AMBULATORY_CARE_PROVIDER_SITE_OTHER): Payer: BC Managed Care – HMO | Admitting: Internal Medicine

## 2013-03-09 ENCOUNTER — Encounter: Payer: Self-pay | Admitting: Internal Medicine

## 2013-03-09 VITALS — BP 122/68 | HR 72 | Ht 68.0 in | Wt 182.0 lb

## 2013-03-09 DIAGNOSIS — I959 Hypotension, unspecified: Secondary | ICD-10-CM

## 2013-03-09 DIAGNOSIS — Z95810 Presence of automatic (implantable) cardiac defibrillator: Secondary | ICD-10-CM

## 2013-03-09 DIAGNOSIS — I2589 Other forms of chronic ischemic heart disease: Secondary | ICD-10-CM

## 2013-03-09 DIAGNOSIS — R55 Syncope and collapse: Secondary | ICD-10-CM

## 2013-03-09 DIAGNOSIS — I255 Ischemic cardiomyopathy: Secondary | ICD-10-CM

## 2013-03-09 LAB — ICD DEVICE OBSERVATION
CHARGE TIME: 9.4 s
HV IMPEDENCE: 105 Ohm
RV LEAD AMPLITUDE: 22.9 mv
TZON-0003FASTVT: 352.9 ms
VENTRICULAR PACING ICD: 1 pct

## 2013-03-09 NOTE — Assessment & Plan Note (Signed)
Resolved with decrease of his beta blockers and ACE inhibitors and discontinuation of amlodipine

## 2013-03-09 NOTE — Progress Notes (Signed)
Patient has no care team.   HPI  Ian Mills is a 57 y.o. male Seen in followup for ICD implanted May 2013 following presentation with ventricular tachycardia for which he takes amiodarone   He has been having problems with syncope unassociated with arrhythmia and accompanied by a stereotypical prodrome. Associated blood pressure has been low.  The concern was that might be related to amiodarone; rather than addressed back initially we decreased his beta blockers and ACE inhibitors.   He is much improved without recurrent syncope.  He has a history of ischemic heart disease with prior stenting of his LAD PDA and OM last in 2008. He underwent catheterization in May 2013 without further PCI. The details of that evaluation are not immediately available. MRI scanning 2008 demonstrated a large anterolateral region of non-viability ejection fraction was 35%.        Past Medical History  Diagnosis Date  . Ischemic cardiomyopathy     s/p mi  multiiple stents  . HLD (hyperlipidemia)   . HTN (hypertension)   . Ventricular tachycardia 11/2011    s/p Defib implant   . ICD  single,BSX     DOI 11/2011    Past Surgical History  Procedure Laterality Date  . Carotid stent      x3 mid LAD, one stent PDA, one stent OM3 in 2/08  . Cardiac defibrillator placement  May 2013  . Tumor removal      chest  . Appendectomy    . Cardiac catheterization  Nov 30 2011    [facility]    Current Outpatient Prescriptions  Medication Sig Dispense Refill  . amiodarone (PACERONE) 200 MG tablet Take 1 tablet daily x 5 days a week  30 tablet  11  . aspirin 81 MG EC tablet Take 81 mg by mouth daily.       Marland Kitchen atorvastatin (LIPITOR) 80 MG tablet Take 1 tablet (80 mg total) by mouth daily.  30 tablet  6  . carvedilol (COREG) 3.125 MG tablet Take 1 tablet (3.125 mg total) by mouth 2 (two) times daily.  180 tablet  3  . clopidogrel (PLAVIX) 75 MG tablet Take 75 mg by mouth daily.      Marland Kitchen ezetimibe (ZETIA) 10 MG tablet Take 1  tablet (10 mg total) by mouth daily.  30 tablet  6  . furosemide (LASIX) 40 MG tablet Take 40 mg by mouth daily.      . hydrochlorothiazide (HYDRODIURIL) 25 MG tablet Take 25 mg by mouth every other day.      . potassium chloride (K-DUR) 10 MEQ tablet Take 1 tablet (10 mEq total) by mouth daily.  90 tablet  3  . ramipril (ALTACE) 5 MG capsule Take 1 capsule (5 mg total) by mouth daily.  60 capsule  3  . sertraline (ZOLOFT) 100 MG tablet Take 100 mg by mouth daily.       Marland Kitchen spironolactone (ALDACTONE) 12.5 mg TABS Take 12.5 mg by mouth daily.      . tadalafil (CIALIS) 10 MG tablet Take 1 tablet (10 mg total) by mouth daily as needed for erectile dysfunction (take 30 min prior to when needed.).  10 tablet  0  . tadalafil (CIALIS) 5 MG tablet Take 1 tablet (5 mg total) by mouth daily as needed for erectile dysfunction.  30 tablet  0  . zolpidem (AMBIEN) 10 MG tablet Take 10 mg by mouth at bedtime as needed.        No current facility-administered  medications for this visit.    Allergies  Allergen Reactions  . Nitroglycerin     Nauseas    Review of Systems negative except from HPI and PMH  Physical Exam BP 122/68  Pulse 72  Ht 5\' 8"  (1.727 m)  Wt 182 lb (82.555 kg)  BMI 27.68 kg/m2 Well developed and nourished in no acute distress HENT normal Neck supple with JVP-flat Clear Regular rate and rhythm,+s4  no murmurs   Abd-soft with active BS No Clubbing cyanosis edema Skin-warm and dry A & Oriented  Grossly normal sensory and motor function    Assessment and  Plan

## 2013-03-09 NOTE — Assessment & Plan Note (Signed)
Continue beta blockers and ACE inhibitors at lower doses

## 2013-03-09 NOTE — Patient Instructions (Signed)
No changes made at today's office visit.   Follow-up with Dr Mariah Milling in November.  Needs lab work for Amiodarone check at this follow-up appointment.  Follow-up with Dr Graciela Husbands in May 2015.

## 2013-03-09 NOTE — Assessment & Plan Note (Signed)
resolvoed a with medication adjustments a a a a a

## 2013-03-09 NOTE — Assessment & Plan Note (Signed)
The patient's device was interrogated.  The information was reviewed. No changes were made in the programming.    

## 2013-03-24 ENCOUNTER — Other Ambulatory Visit: Payer: BC Managed Care – HMO

## 2013-04-02 ENCOUNTER — Encounter: Payer: Self-pay | Admitting: Cardiovascular Disease

## 2013-04-05 ENCOUNTER — Telehealth: Payer: Self-pay | Admitting: *Deleted

## 2013-04-05 NOTE — Telephone Encounter (Signed)
Please call patient has several medication questions.

## 2013-04-07 ENCOUNTER — Other Ambulatory Visit: Payer: BC Managed Care – HMO

## 2013-04-07 ENCOUNTER — Ambulatory Visit (INDEPENDENT_AMBULATORY_CARE_PROVIDER_SITE_OTHER): Payer: BC Managed Care – HMO | Admitting: *Deleted

## 2013-04-07 ENCOUNTER — Ambulatory Visit: Payer: BC Managed Care – HMO

## 2013-04-07 ENCOUNTER — Other Ambulatory Visit: Payer: Self-pay

## 2013-04-07 DIAGNOSIS — E785 Hyperlipidemia, unspecified: Secondary | ICD-10-CM

## 2013-04-07 DIAGNOSIS — I2510 Atherosclerotic heart disease of native coronary artery without angina pectoris: Secondary | ICD-10-CM

## 2013-04-07 DIAGNOSIS — Z79899 Other long term (current) drug therapy: Secondary | ICD-10-CM

## 2013-04-07 DIAGNOSIS — I255 Ischemic cardiomyopathy: Secondary | ICD-10-CM

## 2013-04-07 DIAGNOSIS — I472 Ventricular tachycardia: Secondary | ICD-10-CM

## 2013-04-08 LAB — BASIC METABOLIC PANEL
BUN/Creatinine Ratio: 11 (ref 9–20)
BUN: 11 mg/dL (ref 6–24)
CO2: 26 mmol/L (ref 18–29)
Calcium: 9.3 mg/dL (ref 8.7–10.2)
Chloride: 100 mmol/L (ref 97–108)
Glucose: 88 mg/dL (ref 65–99)

## 2013-04-08 LAB — TSH: TSH: 1.47 u[IU]/mL (ref 0.450–4.500)

## 2013-05-21 ENCOUNTER — Other Ambulatory Visit: Payer: Self-pay | Admitting: Cardiovascular Disease

## 2013-05-21 NOTE — Telephone Encounter (Signed)
Lmtcb amlodipine not on medlist. Last ov with Dr. Mariah Milling mentioned pt to take amlodipine 10 mg 1/2 tablet qd and if BP running low to hold need to verify if pt is currently taking medication.

## 2013-05-25 ENCOUNTER — Other Ambulatory Visit: Payer: Self-pay | Admitting: *Deleted

## 2013-05-25 NOTE — Telephone Encounter (Signed)
lmtcb regarding refill request for amlodipine 10 mg medication is not on med list.

## 2013-05-27 NOTE — Telephone Encounter (Signed)
lmtcb x2 regarding amlodipine (medication is not on medlist).

## 2013-05-28 NOTE — Telephone Encounter (Signed)
Spoke with pt and he mentioned that he does not need any refills at this time. Pt has enough amlodipine to last him until he see's Dr. Mariah Milling.

## 2013-05-28 NOTE — Telephone Encounter (Signed)
Pt does not need any refill at this time on Amlodipine.

## 2013-06-03 ENCOUNTER — Other Ambulatory Visit: Payer: Self-pay

## 2013-06-10 ENCOUNTER — Encounter: Payer: BC Managed Care – HMO | Admitting: *Deleted

## 2013-06-10 ENCOUNTER — Ambulatory Visit: Payer: BC Managed Care – HMO | Admitting: Cardiovascular Disease

## 2013-06-16 ENCOUNTER — Encounter: Payer: Self-pay | Admitting: *Deleted

## 2013-06-16 ENCOUNTER — Telehealth: Payer: Self-pay | Admitting: *Deleted

## 2013-06-16 NOTE — Telephone Encounter (Signed)
Phone # no good. Mailed letter for missed transmission.

## 2013-06-17 ENCOUNTER — Ambulatory Visit (INDEPENDENT_AMBULATORY_CARE_PROVIDER_SITE_OTHER): Payer: BC Managed Care – HMO | Admitting: Cardiovascular Disease

## 2013-06-17 ENCOUNTER — Encounter: Payer: Self-pay | Admitting: Cardiovascular Disease

## 2013-06-17 ENCOUNTER — Other Ambulatory Visit: Payer: Self-pay | Admitting: Cardiovascular Disease

## 2013-06-17 VITALS — BP 138/90 | HR 71 | Ht 69.0 in | Wt 184.0 lb

## 2013-06-17 DIAGNOSIS — I255 Ischemic cardiomyopathy: Secondary | ICD-10-CM

## 2013-06-17 DIAGNOSIS — E785 Hyperlipidemia, unspecified: Secondary | ICD-10-CM

## 2013-06-17 DIAGNOSIS — I2589 Other forms of chronic ischemic heart disease: Secondary | ICD-10-CM

## 2013-06-17 DIAGNOSIS — I472 Ventricular tachycardia: Secondary | ICD-10-CM

## 2013-06-17 DIAGNOSIS — I2510 Atherosclerotic heart disease of native coronary artery without angina pectoris: Secondary | ICD-10-CM

## 2013-06-17 MED ORDER — NITROGLYCERIN 0.4 MG SL SUBL: 0.4000 mg | SUBLINGUAL_TABLET | SUBLINGUAL | Status: DC | PRN
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
## 2013-06-17 NOTE — Telephone Encounter (Signed)
Requested Prescriptions   Signed Prescriptions Disp Refills  . KLOR-CON 10 10 MEQ tablet 90 tablet 3    Sig: take 1 tablet by mouth once daily    Authorizing Provider: Antonieta Iba    Ordering User: Kendrick Fries

## 2013-06-17 NOTE — Assessment & Plan Note (Signed)
We have encouraged him to stay on his high-dose Lipitor

## 2013-06-17 NOTE — Assessment & Plan Note (Signed)
Ejection fraction 30% by echocardiogram at [facility] in 2013. No signs of systolic heart failure. He takes Lasix daily

## 2013-06-17 NOTE — Assessment & Plan Note (Addendum)
I'm very concerned about new EKG findings in the anterolateral leads, inferior leads. He has an occluded LAD, RCA was noted to be moderately diseased in the past. We have discussed the options with him including stress testing versus catheterization. He would prefer a catheterization by the radial approach to be done in mid December. We have suggested to him that this would be fine and we will discuss the case with Dr. Kirke Corin to see if he would be available to perform the procedure. We have recommended that he take nitroglycerin for any chest discomfort or shortness of breath in the meantime.

## 2013-06-17 NOTE — Assessment & Plan Note (Signed)
We'll continue him on amiodarone and low-dose beta blocker. High risk of recurrent arrhythmia given the large region of anterior scar

## 2013-06-17 NOTE — Patient Instructions (Signed)
You are doing well. No medication changes were made.  We will call you at the beginning of December to set up a radial cardiac cath  Please call us if you have new issues that need to be addressed before your next appt.  Your physician wants you to follow-up in: 5 weeks,  after cath in mid december

## 2013-06-18 ENCOUNTER — Other Ambulatory Visit: Payer: Self-pay | Admitting: Cardiovascular Disease

## 2013-06-18 NOTE — Telephone Encounter (Signed)
Could not contact pt due to number being disconnected regarding refill request amlodipine is not on medication list.

## 2013-06-19 ENCOUNTER — Other Ambulatory Visit: Payer: Self-pay | Admitting: Cardiovascular Disease

## 2013-06-21 ENCOUNTER — Other Ambulatory Visit: Payer: Self-pay | Admitting: *Deleted

## 2013-06-21 MED ORDER — FUROSEMIDE 40 MG PO TABS: 40.0000 mg | ORAL_TABLET | Freq: Every day | ORAL | Status: DC

## 2013-06-21 NOTE — Telephone Encounter (Signed)
Requested Prescriptions   Signed Prescriptions Disp Refills  . furosemide (LASIX) 40 MG tablet 30 tablet 3    Sig: Take 1 tablet (40 mg total) by mouth daily.    Authorizing Provider: GOLLAN, TIMOTHY J    Ordering User: LOPEZ, MARINA C    

## 2013-06-28 DIAGNOSIS — R55 Syncope and collapse: Secondary | ICD-10-CM

## 2013-06-28 DIAGNOSIS — R079 Chest pain, unspecified: Secondary | ICD-10-CM

## 2013-06-28 DIAGNOSIS — I2510 Atherosclerotic heart disease of native coronary artery without angina pectoris: Secondary | ICD-10-CM

## 2013-06-28 LAB — CK TOTAL AND CKMB (NOT AT ARMC): CK, Total: 78 U/L (ref 35–232)

## 2013-06-28 LAB — PROTIME-INR
INR: 1
Prothrombin Time: 13.3 secs (ref 11.5–14.7)

## 2013-06-28 LAB — TROPONIN I: Troponin-I: <0.02 ng/mL

## 2013-06-28 LAB — CBC
MCH: 30.1 pg (ref 26.0–34.0)
Platelet: 260 10*3/uL (ref 150–440)
RBC: 4.59 10*6/uL (ref 4.40–5.90)
RDW: 14.1 % (ref 11.5–14.5)
WBC: 9.1 10*3/uL (ref 3.8–10.6)

## 2013-06-28 LAB — COMPREHENSIVE METABOLIC PANEL
Alkaline Phosphatase: 96 U/L
Anion Gap: 6 — ABNORMAL LOW (ref 7–16)
Bilirubin,Total: 0.3 mg/dL (ref 0.2–1.0)
Calcium, Total: 8.7 mg/dL (ref 8.5–10.1)
Chloride: 104 mmol/L (ref 98–107)
Creatinine: 1.09 mg/dL (ref 0.60–1.30)
EGFR (African American): >60
EGFR (Non-African Amer.): >60
Glucose: 118 mg/dL — ABNORMAL HIGH (ref 65–99)
Potassium: 3.6 mmol/L (ref 3.5–5.1)
SGOT(AST): 29 U/L (ref 15–37)
Sodium: 137 mmol/L (ref 136–145)

## 2013-06-28 LAB — MAGNESIUM: Magnesium: 1.9 mg/dL

## 2013-06-29 ENCOUNTER — Encounter: Payer: Self-pay | Admitting: Cardiovascular Disease

## 2013-06-29 ENCOUNTER — Inpatient Hospital Stay: Payer: Self-pay | Admitting: Internal Medicine

## 2013-06-29 DIAGNOSIS — I2510 Atherosclerotic heart disease of native coronary artery without angina pectoris: Secondary | ICD-10-CM

## 2013-06-29 LAB — LIPID PANEL
Ldl Cholesterol, Calc: 50 mg/dL (ref 0–100)
Triglycerides: 118 mg/dL (ref 0–200)
VLDL Cholesterol, Calc: 24 mg/dL (ref 5–40)

## 2013-06-29 LAB — CK-MB: CK-MB: 4.1 ng/mL — ABNORMAL HIGH (ref 0.5–3.6)

## 2013-06-30 LAB — BASIC METABOLIC PANEL
Anion Gap: 6 — ABNORMAL LOW (ref 7–16)
BUN: 16 mg/dL (ref 7–18)
Creatinine: 0.93 mg/dL (ref 0.60–1.30)
EGFR (Non-African Amer.): >60
Osmolality: 270 (ref 275–301)
Sodium: 134 mmol/L — ABNORMAL LOW (ref 136–145)

## 2013-07-02 ENCOUNTER — Encounter: Payer: Self-pay | Admitting: Cardiovascular Disease

## 2013-07-02 ENCOUNTER — Telehealth: Payer: Self-pay

## 2013-07-02 ENCOUNTER — Ambulatory Visit (INDEPENDENT_AMBULATORY_CARE_PROVIDER_SITE_OTHER): Payer: BC Managed Care – HMO | Admitting: Cardiovascular Disease

## 2013-07-02 VITALS — BP 108/72 | HR 61 | Ht 68.0 in | Wt 189.2 lb

## 2013-07-02 DIAGNOSIS — I255 Ischemic cardiomyopathy: Secondary | ICD-10-CM

## 2013-07-02 DIAGNOSIS — E785 Hyperlipidemia, unspecified: Secondary | ICD-10-CM

## 2013-07-02 DIAGNOSIS — I2510 Atherosclerotic heart disease of native coronary artery without angina pectoris: Secondary | ICD-10-CM

## 2013-07-02 DIAGNOSIS — R55 Syncope and collapse: Secondary | ICD-10-CM

## 2013-07-02 MED ORDER — HYDROCHLOROTHIAZIDE 25 MG PO TABS: 25.0000 mg | ORAL_TABLET | Freq: Every day | ORAL | Status: DC | PRN

## 2013-07-02 MED ORDER — CLOPIDOGREL BISULFATE 75 MG PO TABS: 75.0000 mg | ORAL_TABLET | Freq: Every day | ORAL | Status: DC
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
## 2013-07-02 NOTE — Assessment & Plan Note (Signed)
Recent stent placement to his RCA, patent stent in the circumflex and recent catheterization. Now with no further angina
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
## 2013-07-02 NOTE — Patient Instructions (Signed)
You are doing well. No medication changes were made.  Please call us if you have new issues that need to be addressed before your next appt.  Your physician wants you to follow-up in: 6 months.  You will receive a reminder letter in the mail two months in advance. If you don't receive a letter, please call our office to schedule the follow-up appointment.   

## 2013-07-02 NOTE — Assessment & Plan Note (Signed)
Cholesterol is at goal on the current lipid regimen. No changes to the medications were made.  

## 2013-07-02 NOTE — Telephone Encounter (Signed)
Dr. Mariah Milling asked that I call pt to ask if he called in to download his ICD info after his recent episode of syncope. If not, we need to contact Gunnar Fusi to have this done.  Left message on pt's voicemail for him to call back.

## 2013-07-02 NOTE — Assessment & Plan Note (Signed)
Ejection fraction 20% on cardiac catheterization. Recent syncope. Recommended he do ICD download

## 2013-07-05 NOTE — Telephone Encounter (Signed)
2nd attempt to contact pt. Left message on voicemail for pt to call back regarding ICD download.

## 2013-07-08 NOTE — Telephone Encounter (Signed)
3rd attempt to contact pt.  No ring, straight to voicemail. Left message for pt to call back.

## 2013-07-15 ENCOUNTER — Other Ambulatory Visit: Payer: Self-pay

## 2013-07-15 DIAGNOSIS — I472 Ventricular tachycardia: Secondary | ICD-10-CM

## 2013-07-15 DIAGNOSIS — R55 Syncope and collapse: Secondary | ICD-10-CM

## 2013-07-15 MED ORDER — AMIODARONE HCL 200 MG PO TABS: ORAL_TABLET | ORAL | Status: DC

## 2013-07-15 NOTE — Telephone Encounter (Signed)
Refill amiodarone 200 mg take one tablet daily.

## 2013-07-29 DIAGNOSIS — I2589 Other forms of chronic ischemic heart disease: Secondary | ICD-10-CM

## 2013-07-29 DIAGNOSIS — E785 Hyperlipidemia, unspecified: Secondary | ICD-10-CM

## 2013-07-29 DIAGNOSIS — R55 Syncope and collapse: Secondary | ICD-10-CM

## 2013-08-10 ENCOUNTER — Telehealth: Payer: Self-pay

## 2013-08-10 NOTE — Telephone Encounter (Signed)
Notified patient samples available for cialis, aspirin 81 mg and zetia 10 mg.

## 2013-08-10 NOTE — Telephone Encounter (Signed)
Pt needs samples of Zetia, bayer aspirin, and Cialis. Please call.

## 2013-09-10 ENCOUNTER — Other Ambulatory Visit: Payer: Self-pay | Admitting: *Deleted

## 2013-09-10 ENCOUNTER — Other Ambulatory Visit: Payer: Self-pay | Admitting: Cardiovascular Disease

## 2013-09-10 MED ORDER — ATORVASTATIN CALCIUM 80 MG PO TABS: 80.0000 mg | ORAL_TABLET | Freq: Every day | ORAL | Status: DC

## 2013-09-10 NOTE — Telephone Encounter (Signed)
Requested Prescriptions   Signed Prescriptions Disp Refills  . atorvastatin (LIPITOR) 80 MG tablet 30 tablet 3    Sig: Take 1 tablet (80 mg total) by mouth daily.    Authorizing Provider: Minna Merritts    Ordering User: Britt Bottom

## 2013-09-17 ENCOUNTER — Telehealth: Payer: Self-pay

## 2013-09-17 NOTE — Telephone Encounter (Signed)
Samples placed at front desk for pick up. 

## 2013-09-17 NOTE — Telephone Encounter (Signed)
Pt would like Zetia, Cialis, and aspirin samples.

## 2013-10-05 ENCOUNTER — Emergency Department: Payer: Self-pay | Admitting: Emergency Medicine

## 2013-10-12 ENCOUNTER — Other Ambulatory Visit: Payer: Self-pay

## 2013-10-12 MED ORDER — RAMIPRIL 5 MG PO CAPS: 5.0000 mg | ORAL_CAPSULE | Freq: Every day | ORAL | Status: DC

## 2013-10-12 NOTE — Telephone Encounter (Signed)
Refill sent for ramipril 5 mg take one tablet daily.

## 2013-10-19 ENCOUNTER — Telehealth: Payer: Self-pay

## 2013-10-19 ENCOUNTER — Encounter: Payer: Self-pay | Admitting: *Deleted

## 2013-10-19 NOTE — Telephone Encounter (Signed)
Left samples at front desk for pick up.

## 2013-10-19 NOTE — Telephone Encounter (Signed)
Pt would like Zetia , Bayer, and Cialis samples

## 2013-10-25 ENCOUNTER — Other Ambulatory Visit: Payer: Self-pay

## 2013-10-25 MED ORDER — RAMIPRIL 5 MG PO CAPS: 5.0000 mg | ORAL_CAPSULE | Freq: Every day | ORAL | Status: DC

## 2013-10-25 NOTE — Telephone Encounter (Signed)
Requested Prescriptions   Signed Prescriptions Disp Refills  . ramipril (ALTACE) 5 MG capsule 30 capsule 3    Sig: Take 1 capsule (5 mg total) by mouth daily.    Authorizing Provider: Minna Merritts    Ordering User: Britt Bottom

## 2013-11-09 ENCOUNTER — Telehealth: Payer: Self-pay

## 2013-11-09 NOTE — Telephone Encounter (Signed)
Pt would like Bayer aspirin, Zetia, and Cialis samples

## 2013-11-09 NOTE — Telephone Encounter (Signed)
Samples at front desk for pick up. 

## 2013-11-10 ENCOUNTER — Ambulatory Visit (INDEPENDENT_AMBULATORY_CARE_PROVIDER_SITE_OTHER): Payer: 59 | Admitting: *Deleted

## 2013-11-10 DIAGNOSIS — I2589 Other forms of chronic ischemic heart disease: Secondary | ICD-10-CM

## 2013-11-10 DIAGNOSIS — I255 Ischemic cardiomyopathy: Secondary | ICD-10-CM

## 2013-11-10 LAB — MDC_IDC_ENUM_SESS_TYPE_INCLINIC
Battery Remaining Longevity: 144 mo
Brady Statistic RV Percent Paced: 1 %
Date Time Interrogation Session: 20150415040000
HIGH POWER IMPEDANCE MEASURED VALUE: 100 Ohm
Implantable Pulse Generator Serial Number: 105232
Lead Channel Pacing Threshold Amplitude: 0.7 V
Lead Channel Sensing Intrinsic Amplitude: 23.2 mV
Lead Channel Setting Pacing Amplitude: 2.4 V
Lead Channel Setting Sensing Sensitivity: 0.6 mV
MDC IDC MSMT LEADCHNL RV IMPEDANCE VALUE: 620 Ohm
MDC IDC MSMT LEADCHNL RV PACING THRESHOLD PULSEWIDTH: 0.5 ms
MDC IDC SET LEADCHNL RV PACING PULSEWIDTH: 0.5 ms
Zone Setting Detection Interval: 300 ms
Zone Setting Detection Interval: 353 ms

## 2013-11-10 NOTE — Progress Notes (Signed)
ICD check in clinic. Normal device function. Thresholds and sensing consistent with previous device measurements. Impedance trends stable over time. No evidence of any ventricular arrhythmias.  Histogram distribution appropriate for patient and level of activity. No changes made this session. Device programmed at appropriate safety margins. Device programmed to optimize intrinsic conduction. Estimated longevity 12 years.  Patient education completed including shock plan. Alert tones/vibration demonstrated for patient.  ROV 3 months with Dr. Caryl Comes in [place].

## 2013-12-03 ENCOUNTER — Encounter: Payer: Self-pay | Admitting: Internal Medicine

## 2013-12-14 ENCOUNTER — Telehealth: Payer: Self-pay | Admitting: *Deleted

## 2013-12-14 NOTE — Telephone Encounter (Signed)
Patient called wanting samples Zetia, asprin and cialis

## 2013-12-14 NOTE — Telephone Encounter (Signed)
Placed sample of zetia 10 mg, aspirin 81 mg and cialis 20 mg for pick up.

## 2013-12-29 ENCOUNTER — Ambulatory Visit (INDEPENDENT_AMBULATORY_CARE_PROVIDER_SITE_OTHER): Payer: 59 | Admitting: Cardiovascular Disease

## 2013-12-29 ENCOUNTER — Encounter: Payer: Self-pay | Admitting: Cardiovascular Disease

## 2013-12-29 VITALS — BP 134/90 | HR 66 | Ht 70.0 in | Wt 199.0 lb

## 2013-12-29 DIAGNOSIS — E785 Hyperlipidemia, unspecified: Secondary | ICD-10-CM

## 2013-12-29 DIAGNOSIS — Z95810 Presence of automatic (implantable) cardiac defibrillator: Secondary | ICD-10-CM

## 2013-12-29 DIAGNOSIS — I255 Ischemic cardiomyopathy: Secondary | ICD-10-CM

## 2013-12-29 DIAGNOSIS — I2589 Other forms of chronic ischemic heart disease: Secondary | ICD-10-CM

## 2013-12-29 DIAGNOSIS — I2510 Atherosclerotic heart disease of native coronary artery without angina pectoris: Secondary | ICD-10-CM

## 2013-12-29 NOTE — Assessment & Plan Note (Signed)
He's not fasting today. We will check his lab work in the next several weeks. Order placed

## 2013-12-29 NOTE — Progress Notes (Signed)
Patient ID: Ian Mills, male    DOB: [birth year], 58 y.o.   MRN: [id number]  HPI Comments: Ian Mills is a 58 -year-old gentleman with a history of coronary artery disease, 3 stents placed to his mid LAD, one stent to the PDA, one stent to the OM 3 in February 2008 with episodes of chest pressure during his divorce with stress test at [facility], followup cardiac catheterization at [facility], History of syncope x4 episodes total over the past several years.  He is followed by Dr. Virl Axe for ICD.  In followup today, he reports that he is doing well. He has no complaints, no significant chest pain with exertion, no shortness of breath. He denies having any leg edema. He's not taking HCTZ as this tends to "wash him out". Overall he reports that things are stable. He has good insurance, able to get his medications.  He is relatively sedentary.  Not doing a regular exercise program. Able to do his ADLs without significant symptoms. No defibrillator shocks delivered.     May 2013, he had  ventricular tachycardia while in the emergency room while at work. He was diaphoretic, required resuscitation. He was transferred to [facility] and had a cardiac catheterization with no PCI. Chronically occluded LAD, moderately diseased RCA. He had defibrillator placed 12/01/2011 presumably given his large anterior scar and the VT requiring defibrillation in the emergency room.  Emergency room visit 08/17/2012 for chest pain. Cardiac enzymes negative. Discharged home 1 previous episode of nausea, lightheadedness, near syncope.  run of nonsustained VT on 11/06/2012, did not correlate with symptoms   MRI done of his heart in February 2008 showed anterior and anterolateral region was nonviable. Ejection fraction by catheterization at that time was 35%.   Recent hospital admission 06/28/2013 for syncope, malaise, chest pain. He had a fight with his separated wife, at that time had syncope. He presented to the  hospital, cardiac enzymes were negative but considering is frequent episodes of chest pain, cardiac catheterization was performed. Cardiac cath on 06/29/2013 done by radial access showed 70% proximal RCA disease with DES placed. FFR was done to confirm stenosis. He had a patent stent in the left circumflex, chronically occluded LAD, ejection fraction 20%   previous lab work in 2014  showing Total cholesterol 107, LDL 50, HDL 33  EKG shows normal sinus rhythm with rate 66 beats per minute, old anterior infarct,  T wave abnormality in V3 to V6, 1 and aVL   Outpatient Encounter Prescriptions as of 12/29/2013  Medication Sig  . amiodarone (PACERONE) 200 MG tablet Take 1 tablet daily x 5 days a week  . aspirin 81 MG EC tablet Take 81 mg by mouth daily.   Marland Kitchen atorvastatin (LIPITOR) 80 MG tablet take 1 tablet by mouth once daily  . carvedilol (COREG) 3.125 MG tablet Take 1 tablet (3.125 mg total) by mouth 2 (two) times daily.  . clopidogrel (PLAVIX) 75 MG tablet Take 1 tablet (75 mg total) by mouth daily.  Marland Kitchen ezetimibe (ZETIA) 10 MG tablet Take 1 tablet (10 mg total) by mouth daily.  . furosemide (LASIX) 40 MG tablet take 1 tablet by mouth once daily  . hydrochlorothiazide (HYDRODIURIL) 25 MG tablet Take 1 tablet (25 mg total) by mouth daily as needed.  Marland Kitchen KLOR-CON 10 10 MEQ tablet take 1 tablet by mouth once daily  . nitroGLYCERIN (NITROSTAT) 0.4 MG SL tablet Place 1 tablet (0.4 mg total) under the tongue every 5 (five) minutes as needed  for chest pain.  . ramipril (ALTACE) 5 MG capsule Take 1 capsule (5 mg total) by mouth daily.  . tadalafil (CIALIS) 10 MG tablet Take 1 tablet (10 mg total) by mouth daily as needed for erectile dysfunction (take 30 min prior to when needed.).  Marland Kitchen zolpidem (AMBIEN) 10 MG tablet Take 10 mg by mouth at bedtime as needed.      Review of Systems  Constitutional: Negative.   HENT: Negative.   Eyes: Negative.   Respiratory: Negative.   Cardiovascular: Negative.    Gastrointestinal: Negative.   Endocrine: Negative.   Musculoskeletal: Negative.   Skin: Negative.   Allergic/Immunologic: Negative.   Neurological: Negative.   Hematological: Negative.   Psychiatric/Behavioral: Negative.   All other systems reviewed and are negative.  BP 134/90  Pulse 66  Ht 5\' 10"  (1.778 m)  Wt 199 lb (90.266 kg)  BMI 28.55 kg/m2  Physical Exam  Nursing note and vitals reviewed. Constitutional: He is oriented to person, place, and time. He appears well-developed and well-nourished.  HENT:  Head: Normocephalic.  Nose: Nose normal.  Mouth/Throat: Oropharynx is clear and moist.  Eyes: Conjunctivae are normal. Pupils are equal, round, and reactive to light.  Neck: Normal range of motion. Neck supple. No JVD present.  Cardiovascular: Normal rate, regular rhythm, S1 normal, S2 normal, normal heart sounds and intact distal pulses.  Exam reveals no gallop and no friction rub.   No murmur heard. Pulmonary/Chest: Effort normal and breath sounds normal. No respiratory distress. He has no wheezes. He has no rales. He exhibits no tenderness.  Abdominal: Soft. Bowel sounds are normal. He exhibits no distension. There is no tenderness.  Musculoskeletal: Normal range of motion. He exhibits no edema and no tenderness.  Lymphadenopathy:    He has no cervical adenopathy.  Neurological: He is alert and oriented to person, place, and time. Coordination normal.  Skin: Skin is warm and dry. No rash noted. No erythema.  Psychiatric: He has a normal mood and affect. His behavior is normal. Judgment and thought content normal.      Assessment and Plan

## 2013-12-29 NOTE — Assessment & Plan Note (Signed)
Followed by Dr. Klein 

## 2013-12-29 NOTE — Assessment & Plan Note (Signed)
Currently with no symptoms of angina. No further workup at this time. Continue current medication regimen. 

## 2013-12-29 NOTE — Assessment & Plan Note (Signed)
Appears relatively euvolemic. No changes to his medications. Recommended he call her office for worsening leg swelling, also take extra Lasix after lunch in these situations

## 2013-12-29 NOTE — Patient Instructions (Signed)
You are doing well. No medication changes were made.  Please call us if you have new issues that need to be addressed before your next appt.  Your physician wants you to follow-up in: 6 months.  You will receive a reminder letter in the mail two months in advance. If you don't receive a letter, please call our office to schedule the follow-up appointment.   

## 2014-01-20 ENCOUNTER — Other Ambulatory Visit: Payer: Self-pay | Admitting: Cardiovascular Disease

## 2014-01-20 ENCOUNTER — Other Ambulatory Visit: Payer: 59

## 2014-01-20 LAB — HEPATIC FUNCTION PANEL A (ARMC)
ALK PHOS: 98 U/L
ALT: 21 U/L (ref 12–78)
AST: 20 U/L (ref 15–37)
Albumin: 3.4 g/dL (ref 3.4–5.0)
Bilirubin,Total: 0.4 mg/dL (ref 0.2–1.0)
Total Protein: 7.2 g/dL (ref 6.4–8.2)

## 2014-01-20 LAB — LIPID PANEL
Cholesterol: 111 mg/dL (ref 0–200)
HDL Cholesterol: 39 mg/dL — ABNORMAL LOW (ref 40–60)
Ldl Cholesterol, Calc: 54 mg/dL (ref 0–100)
Triglycerides: 88 mg/dL (ref 0–200)
VLDL Cholesterol, Calc: 18 mg/dL (ref 5–40)

## 2014-01-26 NOTE — Telephone Encounter (Signed)
This encounter was created in error - please disregard.

## 2014-02-01 ENCOUNTER — Telehealth: Payer: Self-pay

## 2014-02-01 NOTE — Telephone Encounter (Signed)
Pt would like lab results.  

## 2014-02-02 NOTE — Telephone Encounter (Signed)
Labs not sent to [place] from [facility].  Printed and scanned into pt's chart for MD review.

## 2014-02-02 NOTE — Telephone Encounter (Signed)
Pt called again asking about lab results.

## 2014-02-02 NOTE — Telephone Encounter (Signed)
Lab work reviewed Liver numbers are normal Cholesterol is outstanding Was stay on his current medication

## 2014-02-03 NOTE — Telephone Encounter (Signed)
Reviewed results w/ pt.  

## 2014-02-16 ENCOUNTER — Encounter: Payer: Self-pay | Admitting: *Deleted

## 2014-02-17 ENCOUNTER — Other Ambulatory Visit: Payer: Self-pay | Admitting: Internal Medicine

## 2014-02-21 ENCOUNTER — Other Ambulatory Visit: Payer: Self-pay | Admitting: *Deleted

## 2014-02-21 MED ORDER — CARVEDILOL 3.125 MG PO TABS: 3.1250 mg | ORAL_TABLET | Freq: Two times a day (BID) | ORAL | Status: DC

## 2014-02-28 ENCOUNTER — Telehealth: Payer: Self-pay

## 2014-02-28 NOTE — Telephone Encounter (Signed)
Pt would like Zetia, aspirin, and Cialis samples.

## 2014-02-28 NOTE — Telephone Encounter (Signed)
Placed samples @ front desk for pick up.  

## 2014-03-29 ENCOUNTER — Telehealth: Payer: Self-pay

## 2014-03-29 NOTE — Telephone Encounter (Signed)
Pt would like Zetia, aspirin and Cialis samples

## 2014-03-29 NOTE — Telephone Encounter (Signed)
Notified patient samples of zetia 10 mg available.

## 2014-04-26 ENCOUNTER — Other Ambulatory Visit: Payer: Self-pay | Admitting: Internal Medicine

## 2014-04-27 ENCOUNTER — Encounter: Payer: Self-pay | Admitting: *Deleted

## 2014-05-02 ENCOUNTER — Telehealth: Payer: Self-pay | Admitting: *Deleted

## 2014-05-02 NOTE — Telephone Encounter (Signed)
Needs samples  Zetia 10mg , asprin and celasis.Please call when ready for pick up.

## 2014-05-02 NOTE — Telephone Encounter (Signed)
Placed samples of Zetia 10 mg and Cialis @ front desk for pick up.  No aspirin.

## 2014-05-02 NOTE — Telephone Encounter (Signed)
Pt informed

## 2014-05-23 ENCOUNTER — Other Ambulatory Visit: Payer: Self-pay | Admitting: Cardiovascular Disease

## 2014-05-29 ENCOUNTER — Observation Stay: Payer: Self-pay | Admitting: Internal Medicine

## 2014-05-29 LAB — COMPREHENSIVE METABOLIC PANEL
ALBUMIN: 3.6 g/dL (ref 3.4–5.0)
AST: 21 U/L (ref 15–37)
Alkaline Phosphatase: 116 U/L
Anion Gap: 7 (ref 7–16)
BUN: 11 mg/dL (ref 7–18)
Bilirubin,Total: 0.5 mg/dL (ref 0.2–1.0)
CHLORIDE: 104 mmol/L (ref 98–107)
Calcium, Total: 8.5 mg/dL (ref 8.5–10.1)
Co2: 27 mmol/L (ref 21–32)
Creatinine: 1.12 mg/dL (ref 0.60–1.30)
EGFR (African American): >60
GLUCOSE: 83 mg/dL (ref 65–99)
Osmolality: 274 (ref 275–301)
Potassium: 3.9 mmol/L (ref 3.5–5.1)
SGPT (ALT): 24 U/L
Sodium: 138 mmol/L (ref 136–145)
Total Protein: 7.2 g/dL (ref 6.4–8.2)

## 2014-05-29 LAB — TROPONIN I
Troponin-I: <0.02 ng/mL
Troponin-I: <0.02 ng/mL

## 2014-05-29 LAB — CK TOTAL AND CKMB (NOT AT ARMC)
CK, Total: 201 U/L
CK, Total: 110 U/L
CK-MB: 3.3 ng/mL (ref 0.5–3.6)
CK-MB: 2.3 ng/mL (ref 0.5–3.6)

## 2014-05-29 LAB — CBC
HCT: 36.3 % — ABNORMAL LOW (ref 40.0–52.0)
HGB: 12 g/dL — ABNORMAL LOW (ref 13.0–18.0)
MCH: 31.5 pg (ref 26.0–34.0)
MCHC: 33.1 g/dL (ref 32.0–36.0)
MCV: 95 fL (ref 80–100)
PLATELETS: 196 10*3/uL (ref 150–440)
RBC: 3.82 10*6/uL — AB (ref 4.40–5.90)
RDW: 14.6 % — ABNORMAL HIGH (ref 11.5–14.5)
WBC: 7.5 10*3/uL (ref 3.8–10.6)

## 2014-05-30 ENCOUNTER — Encounter: Payer: Self-pay | Admitting: Physician Assistant

## 2014-05-30 DIAGNOSIS — I255 Ischemic cardiomyopathy: Secondary | ICD-10-CM

## 2014-05-30 DIAGNOSIS — I341 Nonrheumatic mitral (valve) prolapse: Secondary | ICD-10-CM

## 2014-05-30 DIAGNOSIS — R55 Syncope and collapse: Secondary | ICD-10-CM

## 2014-05-30 DIAGNOSIS — I2510 Atherosclerotic heart disease of native coronary artery without angina pectoris: Secondary | ICD-10-CM

## 2014-05-30 DIAGNOSIS — I10 Essential (primary) hypertension: Secondary | ICD-10-CM

## 2014-05-30 LAB — CBC WITH DIFFERENTIAL/PLATELET
BASOS PCT: 1.2 %
Basophil #: 0.1 10*3/uL (ref 0.0–0.1)
EOS ABS: 0.1 10*3/uL (ref 0.0–0.7)
EOS PCT: 2.4 %
HCT: 32.2 % — ABNORMAL LOW (ref 40.0–52.0)
HGB: 10.6 g/dL — ABNORMAL LOW (ref 13.0–18.0)
LYMPHS PCT: 32.3 %
Lymphocyte #: 1.9 10*3/uL (ref 1.0–3.6)
MCH: 31 pg (ref 26.0–34.0)
MCHC: 32.9 g/dL (ref 32.0–36.0)
MCV: 94 fL (ref 80–100)
MONO ABS: 0.8 x10 3/mm (ref 0.2–1.0)
Monocyte %: 12.8 %
NEUTROS ABS: 3 10*3/uL (ref 1.4–6.5)
Neutrophil %: 51.3 %
Platelet: 181 10*3/uL (ref 150–440)
RBC: 3.42 10*6/uL — ABNORMAL LOW (ref 4.40–5.90)
RDW: 14.4 % (ref 11.5–14.5)
WBC: 5.9 10*3/uL (ref 3.8–10.6)

## 2014-05-30 LAB — BASIC METABOLIC PANEL
Anion Gap: 5 — ABNORMAL LOW (ref 7–16)
BUN: 12 mg/dL (ref 7–18)
CHLORIDE: 106 mmol/L (ref 98–107)
CREATININE: 1.15 mg/dL (ref 0.60–1.30)
Calcium, Total: 8.1 mg/dL — ABNORMAL LOW (ref 8.5–10.1)
Co2: 26 mmol/L (ref 21–32)
GLUCOSE: 104 mg/dL — AB (ref 65–99)
Osmolality: 274 (ref 275–301)
Potassium: 3.9 mmol/L (ref 3.5–5.1)
Sodium: 137 mmol/L (ref 136–145)

## 2014-06-01 ENCOUNTER — Ambulatory Visit (INDEPENDENT_AMBULATORY_CARE_PROVIDER_SITE_OTHER): Payer: 59 | Admitting: Cardiovascular Disease

## 2014-06-01 ENCOUNTER — Encounter: Payer: Self-pay | Admitting: Cardiovascular Disease

## 2014-06-01 ENCOUNTER — Encounter (INDEPENDENT_AMBULATORY_CARE_PROVIDER_SITE_OTHER): Payer: Self-pay

## 2014-06-01 VITALS — BP 128/84 | HR 79 | Ht 69.0 in | Wt 192.5 lb

## 2014-06-01 DIAGNOSIS — I255 Ischemic cardiomyopathy: Secondary | ICD-10-CM

## 2014-06-01 DIAGNOSIS — I2510 Atherosclerotic heart disease of native coronary artery without angina pectoris: Secondary | ICD-10-CM

## 2014-06-01 DIAGNOSIS — G4700 Insomnia, unspecified: Secondary | ICD-10-CM | POA: Insufficient documentation

## 2014-06-01 DIAGNOSIS — R55 Syncope and collapse: Secondary | ICD-10-CM | POA: Insufficient documentation

## 2014-06-01 DIAGNOSIS — I472 Ventricular tachycardia, unspecified: Secondary | ICD-10-CM

## 2014-06-01 DIAGNOSIS — I959 Hypotension, unspecified: Secondary | ICD-10-CM

## 2014-06-01 HISTORY — DX: Insomnia, unspecified: G47.00

## 2014-06-01 MED ORDER — ZOLPIDEM TARTRATE 10 MG PO TABS: 10.0000 mg | ORAL_TABLET | Freq: Every evening | ORAL | Status: DC | PRN

## 2014-06-01 NOTE — Assessment & Plan Note (Signed)
Hospital records, labs, testing reviewed We will monitor him closely for further episodes

## 2014-06-01 NOTE — Assessment & Plan Note (Signed)
He does not have primary care. Recommended he follow-up with psychiatry We will renew his Ambien until he is able to see them in follow-up

## 2014-06-01 NOTE — Assessment & Plan Note (Signed)
Recent syncope likely secondary to vasovagal etiology after urination. No further episodes since that time. Recommended he stay hydrated to some degree, call our office if he has additional episodes. If he does have lightheadedness, suggested he lay in a supine position. Less likely ischemic in etiology

## 2014-06-01 NOTE — Assessment & Plan Note (Signed)
Recent echocardiogram confirming moderate to severely depressed ejection fraction. No change from prior studies. He appears relatively euvolemic on today's visit

## 2014-06-01 NOTE — Assessment & Plan Note (Signed)
Currently with no symptoms of angina. No further workup at this time. Continue current medication regimen. 

## 2014-06-01 NOTE — Patient Instructions (Addendum)
You are doing well. No medication changes were made.  Please use ambien for sleep  Please call us if you have new issues that need to be addressed before your next appt.  Your physician wants you to follow-up in: 6 months.  You will receive a reminder letter in the mail two months in advance. If you don't receive a letter, please call our office to schedule the follow-up appointment.  Your next appointment will be scheduled in our new office located at :  [facility]  [address] [place]  [place], [place] [address]

## 2014-06-01 NOTE — Assessment & Plan Note (Signed)
Recent interrogation of his ICD. No arrhythmia noted

## 2014-06-01 NOTE — Progress Notes (Signed)
Patient ID: Ian Mills, male    DOB: July 16, 1956, 58 y.o.   MRN: [id number]  HPI Comments: [name] is a 58 -year-old gentleman with a history of coronary artery disease, 3 stents placed to his mid LAD, one stent to the PDA, one stent to the OM 3 in February 2008 with episodes of chest pressure during his divorce with stress test at [facility], followup cardiac catheterization at [facility], History of syncope x4 episodes total over the past several years.  He is followed by Dr. Virl Axe for ICD.  In followup today, he reports recent episode of syncope 05/29/2014. He got up overnight to go to the bathroom, after urination he felt very lightheaded, had syncope, hit his head. He went to the hospital and was evaluated.  Notes from the hospital were reviewed including the testing, lab work and consultation notes Seen by cardiology It was felt he had vasovagal syncope. ICD was interrogated showing no arrhythmia Carotid ultrasound showed only mild to moderate carotid arterial disease bilaterally Echocardiogram was essentially unchanged with ejection fraction 25-30%  Since his discharge, he has felt a little foggy, he suspects secondary to hitting his head. He has stopped HCTZ to avoid low blood pressure. Also he feels this washes him out Orthostatics done in the office show blood pressure ranging from 150 up to 160, no orthostatic measurements measured He reports having significant insomnia and is requesting a medication such as Ambien which she was taking previously. Uncertain if depression is playing a role in his insomnia.   He denies having any leg edema.  Overall he reports that things are stable. He has good insurance, able to get his medications.  He is relatively sedentary.  Not doing a regular exercise program. Able to do his ADLs without significant symptoms. No defibrillator shocks delivered.  Past medical history  May 2013, he had  ventricular tachycardia while in the  emergency room while at work. He was diaphoretic, required resuscitation. He was transferred to [facility] and had a cardiac catheterization with no PCI. Chronically occluded LAD, moderately diseased RCA. He had defibrillator placed 12/01/2011 presumably given his large anterior scar and the VT requiring defibrillation in the emergency room.  Emergency room visit 08/17/2012 for chest pain. Cardiac enzymes negative. Discharged home 1 previous episode of nausea, lightheadedness, near syncope.  run of nonsustained VT on 11/06/2012, did not correlate with symptoms   MRI done of his heart in February 2008 showed anterior and anterolateral region was nonviable. Ejection fraction by catheterization at that time was 35%.   hospital admission 06/28/2013 for syncope, malaise, chest pain. He had a fight with his separated wife, at that time had syncope. He presented to the hospital, cardiac enzymes were negative but considering is frequent episodes of chest pain, cardiac catheterization was performed. Cardiac cath on 06/29/2013 done by radial access showed 70% proximal RCA disease with DES placed. FFR was done to confirm stenosis. He had a patent stent in the left circumflex, chronically occluded LAD, ejection fraction 20%   previous lab work in 2014  showing Total cholesterol 107, LDL 50, HDL 33  EKG done on today's visit shows normal sinus rhythm with rate 79 beats per minute, old anterior infarct,  T wave abnormality  1 and aVL   Outpatient Encounter Prescriptions as of 06/01/2014  Medication Sig  . amiodarone (PACERONE) 200 MG tablet TAKE 1 TABLET BY MOUTH ONCE DAILY 5 DAYS A AWEEK  . aspirin 81 MG EC tablet Take 81  mg by mouth daily.   Marland Kitchen atorvastatin (LIPITOR) 80 MG tablet TAKE 1 TABLET BY MOUTH EVERY DAY  . carvedilol (COREG) 3.125 MG tablet Take 1 tablet (3.125 mg total) by mouth 2 (two) times daily.  . clopidogrel (PLAVIX) 75 MG tablet Take 1 tablet (75 mg total) by mouth daily.  Marland Kitchen ezetimibe (ZETIA) 10  MG tablet Take 1 tablet (10 mg total) by mouth daily.  . furosemide (LASIX) 40 MG tablet TAKE 1 TABLET BY MOUTH ONCE DAILY  . hydrochlorothiazide (HYDRODIURIL) 25 MG tablet Take 1 tablet (25 mg total) by mouth daily as needed.  Marland Kitchen KLOR-CON 10 10 MEQ tablet take 1 tablet by mouth once daily  . nitroGLYCERIN (NITROSTAT) 0.4 MG SL tablet Place 1 tablet (0.4 mg total) under the tongue every 5 (five) minutes as needed for chest pain.  . ramipril (ALTACE) 5 MG capsule Take 1 capsule (5 mg total) by mouth daily.  . tadalafil (CIALIS) 10 MG tablet Take 1 tablet (10 mg total) by mouth daily as needed for erectile dysfunction (take 30 min prior to when needed.).  . [DISCONTINUED] zolpidem (AMBIEN) 10 MG tablet Take 10 mg by mouth at bedtime as needed.    In terms of his social history  reports that he quit smoking about 2 years ago. His smoking use included Cigarettes. He has a 7 pack-year smoking history. He does not have any smokeless tobacco history on file. He reports that he does not drink alcohol or use illicit drugs.  Review of Systems  Constitutional: Negative.   Respiratory: Negative.   Cardiovascular: Negative.   Endocrine: Negative.   Musculoskeletal: Negative.   Neurological: Positive for dizziness and syncope.  Hematological: Negative.   All other systems reviewed and are negative.  BP 128/84 mmHg  Pulse 79  Ht 5\' 9"  (1.753 m)  Wt 192 lb 8 oz (87.317 kg)  BMI 28.41 kg/m2  Physical Exam  Constitutional: He is oriented to person, place, and time. He appears well-developed and well-nourished.  HENT:  Head: Normocephalic.  Nose: Nose normal.  Mouth/Throat: Oropharynx is clear and moist.  Eyes: Conjunctivae are normal. Pupils are equal, round, and reactive to light.  Neck: Normal range of motion. Neck supple. No JVD present.  Cardiovascular: Normal rate, regular rhythm, S1 normal, S2 normal, normal heart sounds and intact distal pulses.  Exam reveals no gallop and no friction rub.    No murmur heard. Pulmonary/Chest: Effort normal and breath sounds normal. No respiratory distress. He has no wheezes. He has no rales. He exhibits no tenderness.  Abdominal: Soft. Bowel sounds are normal. He exhibits no distension. There is no tenderness.  Musculoskeletal: Normal range of motion. He exhibits no edema or tenderness.  Lymphadenopathy:    He has no cervical adenopathy.  Neurological: He is alert and oriented to person, place, and time. Coordination normal.  Skin: Skin is warm and dry. No rash noted. No erythema.  Psychiatric: He has a normal mood and affect. His behavior is normal. Judgment and thought content normal.      Assessment and Plan   Nursing note and vitals reviewed.

## 2014-06-01 NOTE — Assessment & Plan Note (Signed)
He has held the HCTZ in the past to avoid hypotension. No medication changes made on today's visit. Blood pressure was actually elevated

## 2014-06-08 ENCOUNTER — Encounter: Payer: Self-pay | Admitting: *Deleted

## 2014-06-26 ENCOUNTER — Other Ambulatory Visit: Payer: Self-pay | Admitting: Cardiovascular Disease

## 2014-06-27 ENCOUNTER — Ambulatory Visit: Payer: 59 | Admitting: Cardiovascular Disease

## 2014-07-13 ENCOUNTER — Telehealth: Payer: Self-pay | Admitting: *Deleted

## 2014-07-13 NOTE — Telephone Encounter (Signed)
Samples of Zetia, asprin and Cealis. Please call when ready.

## 2014-07-13 NOTE — Telephone Encounter (Signed)
Zetia 10 mg and aspirin 81 mg @ front desk for pick up. We do not have any cialis will contact drug rep.

## 2014-07-24 ENCOUNTER — Other Ambulatory Visit: Payer: Self-pay | Admitting: Cardiovascular Disease

## 2014-07-26 ENCOUNTER — Encounter: Payer: Self-pay | Admitting: *Deleted

## 2014-08-01 ENCOUNTER — Telehealth: Payer: Self-pay

## 2014-08-01 NOTE — Telephone Encounter (Signed)
We do not have any samples of Cialis or Zetia. Will contact drug rep for samples.

## 2014-08-01 NOTE — Telephone Encounter (Signed)
Pt would like Zetia and Cialis samples.

## 2014-08-05 ENCOUNTER — Other Ambulatory Visit: Payer: Self-pay

## 2014-08-05 MED ORDER — TADALAFIL 10 MG PO TABS: 10.0000 mg | ORAL_TABLET | Freq: Every day | ORAL | Status: DC | PRN

## 2014-08-05 MED ORDER — EZETIMIBE 10 MG PO TABS: 10.0000 mg | ORAL_TABLET | Freq: Every day | ORAL | Status: DC

## 2014-08-17 NOTE — Telephone Encounter (Signed)
This encounter was created in error - please disregard.

## 2014-08-29 ENCOUNTER — Telehealth: Payer: Self-pay | Admitting: Cardiovascular Disease

## 2014-08-29 MED ORDER — ASPIRIN 81 MG PO TBEC: 81.0000 mg | DELAYED_RELEASE_TABLET | Freq: Every day | ORAL | Status: DC

## 2014-08-29 MED ORDER — TADALAFIL 10 MG PO TABS: 10.0000 mg | ORAL_TABLET | Freq: Every day | ORAL | Status: DC | PRN

## 2014-08-29 MED ORDER — EZETIMIBE 10 MG PO TABS: 10.0000 mg | ORAL_TABLET | Freq: Every day | ORAL | Status: DC

## 2014-08-29 NOTE — Telephone Encounter (Signed)
Pt needs samples of med's listed below:  zetyia  10 mg aspin   Baby Asprin  Cialis   25 or 1 a day ones.   Please call patient when ready.

## 2014-08-29 NOTE — Telephone Encounter (Signed)
Refill sent for aspirin, zetia and cialis

## 2014-08-31 ENCOUNTER — Telehealth: Payer: Self-pay | Admitting: Cardiovascular Disease

## 2014-08-31 NOTE — Telephone Encounter (Signed)
Pt needs samples of med's listed below:  zetyia 10 mg aspin Baby Asprin  Cialis 25 or 1 a day ones.   Please call patient when ready.  Can't afford to go get them.

## 2014-09-01 NOTE — Telephone Encounter (Signed)
No zetia samples available.  Aspirin and cialis samples at front desk for pick up.

## 2014-09-13 ENCOUNTER — Telehealth: Payer: Self-pay | Admitting: *Deleted

## 2014-09-13 NOTE — Telephone Encounter (Signed)
Patient would like samples Zetia, asprin and Cealis. Please call patient.  Unable to afford  Prescription.

## 2014-09-13 NOTE — Telephone Encounter (Signed)
Samples @ the front desk for pick up.

## 2014-09-21 ENCOUNTER — Telehealth: Payer: Self-pay

## 2014-09-21 NOTE — Telephone Encounter (Signed)
Spoke w/ pt.  He reports that he has been having some tightness in his chest for about the past week.  He has not taken his meds, as he cannot afford them and was not aware that samples were left at the front desk for him to pick up. Pt denies SOB, n/v, diaphoresis, or any radiation of sensation.  Offered pt appt to come in tomorrow to see Dr. Rockey Situ, but he reports that he has a dental appt for a root canal in the am.  Pt has rescheduled dental appt several times and states "I'm scared to move it again b/c they yelled at me". Pt is sched to see Dr. Rockey Situ on 09/29/14, but verbalizes understanding to take a nitro is cp develops and to call 911 if sx become emergent.  Pt would like to p/u his samples today, advised him that I will give him additional boxes if available.  Pt is appreciative and will call back w/ any further questions or concerns.

## 2014-09-21 NOTE — Telephone Encounter (Signed)
Pt states he is just not feeling good, states his chest "is not real tight, but I can tell a difference, and my energy level has dropped" Pt could not come in this week. He is schedule for 3/3

## 2014-09-29 ENCOUNTER — Ambulatory Visit (INDEPENDENT_AMBULATORY_CARE_PROVIDER_SITE_OTHER): Payer: 59 | Admitting: Cardiovascular Disease

## 2014-09-29 ENCOUNTER — Encounter: Payer: Self-pay | Admitting: Cardiovascular Disease

## 2014-09-29 VITALS — BP 140/86 | HR 64 | Ht 70.0 in | Wt 200.5 lb

## 2014-09-29 DIAGNOSIS — F17200 Nicotine dependence, unspecified, uncomplicated: Secondary | ICD-10-CM

## 2014-09-29 DIAGNOSIS — Z720 Tobacco use: Secondary | ICD-10-CM

## 2014-09-29 DIAGNOSIS — I2510 Atherosclerotic heart disease of native coronary artery without angina pectoris: Secondary | ICD-10-CM

## 2014-09-29 DIAGNOSIS — G4700 Insomnia, unspecified: Secondary | ICD-10-CM

## 2014-09-29 DIAGNOSIS — R0602 Shortness of breath: Secondary | ICD-10-CM

## 2014-09-29 DIAGNOSIS — Z95810 Presence of automatic (implantable) cardiac defibrillator: Secondary | ICD-10-CM

## 2014-09-29 DIAGNOSIS — E785 Hyperlipidemia, unspecified: Secondary | ICD-10-CM

## 2014-09-29 DIAGNOSIS — Z87891 Personal history of nicotine dependence: Secondary | ICD-10-CM | POA: Insufficient documentation

## 2014-09-29 DIAGNOSIS — R55 Syncope and collapse: Secondary | ICD-10-CM

## 2014-09-29 MED ORDER — VARENICLINE TARTRATE 1 MG PO TABS: 1.0000 mg | ORAL_TABLET | Freq: Two times a day (BID) | ORAL | Status: DC

## 2014-09-29 MED ORDER — ZOLPIDEM TARTRATE 10 MG PO TABS: 10.0000 mg | ORAL_TABLET | Freq: Every evening | ORAL | Status: DC | PRN

## 2014-09-29 NOTE — Assessment & Plan Note (Signed)
Prescription for [name] provided. Strongly recommended to stop smoking

## 2014-09-29 NOTE — Assessment & Plan Note (Signed)
Currently with no symptoms of angina. No further workup at this time. Continue current medication regimen. Recommended smoking cessation 

## 2014-09-29 NOTE — Assessment & Plan Note (Signed)
Prescription for Ambien provided until he is able to establish with primary care. Possible underlying depression. Wakes up at 1 AM on a regular basis, unable to get back to sleep until 4 AM. Tired all day. Recommended a exercise program

## 2014-09-29 NOTE — Assessment & Plan Note (Signed)
No recent episodes of near syncope or syncope. HCTZ held in the past for hypotension with improvement of his symptoms. Possibly vasovagal in the past, happened after urination. Nothing recent

## 2014-09-29 NOTE — Patient Instructions (Addendum)
You are doing well. Need to quit smoking  Please start chantix 1/2 pill twice a day slowly increasing up to a full pill  Ambien as needed for sleep  Follow up with Dr. Caryl Comes, EP appt, follow up  Please call us if you have new issues that need to be addressed before your next appt.  Your physician wants you to follow-up in: 6 months.  You will receive a reminder letter in the mail two months in advance. If you don't receive a letter, please call our office to schedule the follow-up appointment.

## 2014-09-29 NOTE — Progress Notes (Signed)
Patient ID: Ian Mills, male    DOB: 1955-10-31, 59 y.o.   MRN: [id number]  HPI Comments: Ian Mills is a 59 -year-old gentleman with a history of coronary artery disease, 3 stents placed to his mid LAD, one stent to the PDA, one stent to the OM 3 in February 2008 with episodes of chest pressure during his divorce with stress test at [facility], followup cardiac catheterization at [facility], History of syncope x4 episodes total over the past several years.  He is followed by Dr. Virl Axe for ICD. He presents today for follow-up of his coronary artery disease. He has started to smoke again. He has not had his defibrillator checked in some time  In follow-up today, he reports having chronic insomnia. He does not have primary care. Ambien had been provided to him through our office in the past with some success and he is requesting this again until he can establish care with primary physician. He reports that he has been stressed more recently, started smoking again. He is requesting a prescription of [name]. Also needs samples of Cialis and zetia. The latter is very expensive for him He denies any significant chest pain with exertion. Feels tired all the time which she attributes to his poor sleep hygiene.  EKG on today's visit shows normal sinus rhythm with rate 64 bpm, old anterior MI, T-wave abnormality in the anterolateral leads  Other past medical history  episode of syncope 05/29/2014. He got up overnight to go to the bathroom, after urination he felt very lightheaded, had syncope, hit his head. He went to the hospital and was evaluated.  Notes from the hospital were reviewed including the testing, lab work and consultation notes Seen by cardiology It was felt he had vasovagal syncope. ICD was interrogated showing no arrhythmia Carotid ultrasound showed only mild to moderate carotid arterial disease bilaterally Echocardiogram was essentially unchanged with ejection fraction  25-30%   He denies having any leg edema.  Overall he reports that things are stable. He has good insurance, able to get his medications.  He is relatively sedentary.  Not doing a regular exercise program. Able to do his ADLs without significant symptoms. No defibrillator shocks delivered.  Past medical history  May 2013, he had  ventricular tachycardia while in the emergency room while at work. He was diaphoretic, required resuscitation. He was transferred to [facility] and had a cardiac catheterization with no PCI. Chronically occluded LAD, moderately diseased RCA. He had defibrillator placed 12/01/2011 presumably given his large anterior scar and the VT requiring defibrillation in the emergency room.  Emergency room visit 08/17/2012 for chest pain. Cardiac enzymes negative. Discharged home 1 previous episode of nausea, lightheadedness, near syncope.  run of nonsustained VT on 11/06/2012, did not correlate with symptoms   MRI done of his heart in February 2008 showed anterior and anterolateral region was nonviable. Ejection fraction by catheterization at that time was 35%.   hospital admission 06/28/2013 for syncope, malaise, chest pain. He had a fight with his separated wife, at that time had syncope. He presented to the hospital, cardiac enzymes were negative but considering is frequent episodes of chest pain, cardiac catheterization was performed. Cardiac cath on 06/29/2013 done by radial access showed 70% proximal RCA disease with DES placed. FFR was done to confirm stenosis. He had a patent stent in the left circumflex, chronically occluded LAD, ejection fraction 20%   previous lab work in 2014  showing Total cholesterol 107, LDL 50, HDL  33  Allergies  Allergen Reactions  . Nitroglycerin     Nauseas    Outpatient Encounter Prescriptions as of 61/09/2014  Medication Sig  . amiodarone (PACERONE) 200 MG tablet TAKE 1 TABLET BY MOUTH ONCE DAILY 5 DAYS A AWEEK  . aspirin 81 MG EC tablet Take 1  tablet (81 mg total) by mouth daily.  Marland Kitchen atorvastatin (LIPITOR) 80 MG tablet TAKE 1 TABLET BY MOUTH EVERY DAY  . carvedilol (COREG) 3.125 MG tablet Take 1 tablet (3.125 mg total) by mouth 2 (two) times daily.  . clopidogrel (PLAVIX) 75 MG tablet TAKE 1 TABLET BY MOUTH ONCE DAILY  . ezetimibe (ZETIA) 10 MG tablet Take 1 tablet (10 mg total) by mouth daily.  . furosemide (LASIX) 40 MG tablet TAKE 1 TABLET BY MOUTH ONCE DAILY  . hydrochlorothiazide (HYDRODIURIL) 25 MG tablet Take 1 tablet (25 mg total) by mouth daily as needed.  Marland Kitchen KLOR-CON 10 10 MEQ tablet take 1 tablet by mouth once daily  . nitroGLYCERIN (NITROSTAT) 0.4 MG SL tablet Place 1 tablet (0.4 mg total) under the tongue every 5 (five) minutes as needed for chest pain.  . potassium chloride (K-DUR,KLOR-CON) 10 MEQ tablet TAKE 1 TABLET BY MOUTH ONCE DAILY  . ramipril (ALTACE) 5 MG capsule Take 1 capsule (5 mg total) by mouth daily.  . tadalafil (CIALIS) 10 MG tablet Take 1 tablet (10 mg total) by mouth daily as needed for erectile dysfunction (take 30 min prior to when needed.).  Marland Kitchen zolpidem (AMBIEN) 10 MG tablet Take 1 tablet (10 mg total) by mouth at bedtime as needed.  . [DISCONTINUED] zolpidem (AMBIEN) 10 MG tablet Take 1 tablet (10 mg total) by mouth at bedtime as needed.  . varenicline ([name]) 1 MG tablet Take 1 tablet (1 mg total) by mouth 2 (two) times daily.    Past Medical History  Diagnosis Date  . Ischemic cardiomyopathy     s/p mi  multiiple stents  . HLD (hyperlipidemia)   . HTN (hypertension)   . Ventricular tachycardia 11/2011    s/p Defib implant   . ICD  single,BSX     DOI 11/2011  . Coronary artery disease   . Syncope and collapse     Past Surgical History  Procedure Laterality Date  . Carotid stent      x3 mid LAD, one stent PDA, one stent OM3 in 2/08  . Cardiac defibrillator placement  May 2013  . Tumor removal      chest  . Appendectomy    . Cardiac catheterization  Nov 30 2011    [facility]  . Coronary  angioplasty  2014    x 2 stents    Social History  reports that he quit smoking about 2 years ago. His smoking use included Cigarettes. He has a 7 pack-year smoking history. He does not have any smokeless tobacco history on file. He reports that he does not drink alcohol or use illicit drugs.  Family History Family history is unknown by patient.   Review of Systems  Constitutional: Negative.   Respiratory: Negative.   Cardiovascular: Negative.   Musculoskeletal: Negative.   Neurological: Negative.   Hematological: Negative.   All other systems reviewed and are negative.  BP 140/86 mmHg  Pulse 64  Ht 5\' 10"  (1.778 m)  Wt 200 lb 8 oz (90.946 kg)  BMI 28.77 kg/m2  Physical Exam  Constitutional: He is oriented to person, place, and time. He appears well-developed and well-nourished.  HENT:  Head:  Normocephalic.  Nose: Nose normal.  Mouth/Throat: Oropharynx is clear and moist.  Eyes: Conjunctivae are normal. Pupils are equal, round, and reactive to light.  Neck: Normal range of motion. Neck supple. No JVD present.  Cardiovascular: Normal rate, regular rhythm, S1 normal, S2 normal, normal heart sounds and intact distal pulses.  Exam reveals no gallop and no friction rub.   No murmur heard. Pulmonary/Chest: Effort normal and breath sounds normal. No respiratory distress. He has no wheezes. He has no rales. He exhibits no tenderness.  Abdominal: Soft. Bowel sounds are normal. He exhibits no distension. There is no tenderness.  Musculoskeletal: Normal range of motion. He exhibits no edema or tenderness.  Lymphadenopathy:    He has no cervical adenopathy.  Neurological: He is alert and oriented to person, place, and time. Coordination normal.  Skin: Skin is warm and dry. No rash noted. No erythema.  Psychiatric: He has a normal mood and affect. His behavior is normal. Judgment and thought content normal.      Assessment and Plan   Nursing note and vitals reviewed.

## 2014-09-29 NOTE — Assessment & Plan Note (Signed)
We'll continue him on his current medications. Goal LDL less than 70

## 2014-09-29 NOTE — Assessment & Plan Note (Signed)
We will schedule him for follow-up appointment with Dr. Caryl Comes

## 2014-10-05 ENCOUNTER — Telehealth: Payer: Self-pay | Admitting: *Deleted

## 2014-10-05 NOTE — Telephone Encounter (Signed)
Spoke with pharmacist pt needing PA for [name] Telephone#[phone] Member Id# [id number]

## 2014-10-05 NOTE — Telephone Encounter (Signed)
Spoke with PA rep. And initiated PA they will fax over document to let us know if patient has been approved; Awaiting fax

## 2014-10-05 NOTE — Telephone Encounter (Signed)
Pharmacy calling asking if we received a fax from them asking for a prior auth for Chantax Please call if we have not received the fax.

## 2014-10-06 NOTE — Telephone Encounter (Signed)
Patient has been denied for chantix documents have been placed in MD inbox for review.

## 2014-10-24 ENCOUNTER — Telehealth: Payer: Self-pay | Admitting: Cardiovascular Disease

## 2014-10-24 ENCOUNTER — Telehealth: Payer: Self-pay | Admitting: Internal Medicine

## 2014-10-24 NOTE — Telephone Encounter (Signed)
Patient would like to return to office today to pick up as he lives out of town and will be heading home around 3 today.  Please call patient.

## 2014-10-24 NOTE — Telephone Encounter (Signed)
Placed samples @ front desk for pick up.

## 2014-10-24 NOTE — Telephone Encounter (Signed)
Patient wants Samples of [name], cialis, zetia

## 2014-10-24 NOTE — Telephone Encounter (Signed)
Call patient when we have scheduled open to make a device check appt. Patient is OD

## 2014-11-10 ENCOUNTER — Telehealth: Payer: Self-pay | Admitting: *Deleted

## 2014-11-10 NOTE — Telephone Encounter (Signed)
Pt wanted to know if he could be placed on alternate drug for [name] due to him previously being denied coverage by insurance. Pt mentioned that chantix was working fine for him but due to insurance reasons he has not been able to receive medication. Please advise.

## 2014-11-10 NOTE — Telephone Encounter (Signed)
Placed samples @ front desk for pick up.  Zetia  Aspirin  Cialis

## 2014-11-10 NOTE — Telephone Encounter (Signed)
Pt needs samples of,  Zetia low dosage Asprin  Cialis  Please call when ready to be picked up

## 2014-11-16 NOTE — Telephone Encounter (Signed)
Only other medication I know is Wellbutrin Would probably need primary care to start this for him to avoid interaction with his other medications See have primary care  Other option would be to see if we have enough samples to get him going Needs nicotine patches, consider e-cigarette

## 2014-11-16 NOTE — Telephone Encounter (Signed)
I meant to say: Does he have primary care

## 2014-11-16 NOTE — Telephone Encounter (Signed)
Spoke w/ pt.  Advised him of Dr. Gollan's recommendation.  He is appreciative and will call back w/ any further questions or concerns.  

## 2014-11-16 NOTE — Telephone Encounter (Signed)
Attempted to contact pt.  No answer, voice mail box is full.

## 2014-11-17 ENCOUNTER — Other Ambulatory Visit: Payer: Self-pay | Admitting: Cardiovascular Disease

## 2014-11-18 NOTE — H&P (Signed)
PATIENT NAME:  Ian Mills, Ian Mills MR#:  [id number] DATE OF BIRTH:  09/05/1955  DATE OF ADMISSION:  06/28/2013  PRIMARY CARE PROVIDER AND CARDIOLOGIST: Ida Rogue, MD  CHIEF COMPLAINT: Chest pain and syncope.   HISTORY OF PRESENT ILLNESS: This is a 59 year old Caucasian male patient with history of coronary artery disease, cardiomyopathy, AICD, hyperlipidemia, and hypertension who presents to the Emergency Room complaining of an episode of syncope on Thursday, when he felt like his AICD had fired, although the patient is not sure. The patient did have episodes of syncope previously with his blood pressure, but he mentions that after some changes that were made recently he has not had problems with blood pressure. Since the syncopal episode on Thursday, the patient has had midsternal chest pain radiating to the left arm, which has been constant. He mentioned that he has not been able to get comfortable and there are no aggravating or alleviating factors, although during examination the patient had definitely tenderness on his chest with movement of the arm.   Here in the Emergency Room, the patient's EKG shows poor R wave progression. No acute ST-T wave changes. Cardiac enzymes are normal.   The patient does have his last office note from Dr. Rockey Situ where he was supposed to call the office in December to schedule for cardiac catheterization with Dr. Fletcher Anon.   PAST MEDICAL HISTORY: 1.  CAD with multiple stents.  2.  Hypothyroidism.  3.  Hyperlipidemia.  4.  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  5.  Hypertension.   SOCIAL HISTORY: The patient is a past smoker. Occasional alcohol. No illicit drugs. 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   CODE STATUS: FULL.  ALLERGIES: NITROGLYCERIN DRIP, WHICH CAUSES NAUSEA AND VOMITING.   HOME MEDICATIONS: Include:  1.  Amiodarone 200 mg oral daily 5 days a week.  2.  Aspirin 81 mg daily.  3.   Atorvastatin 80 mg daily.  4.  Coreg 3.125 mg b.i.d.  5.  Plavix 75 mg daily.  7.  Lasix 20 mg daily.  8.  Hydrochlorothiazide 25 mg daily.  9.  Potassium chloride 10 mEq oral daily.  10.  Nitroglycerin 0.3 sublingual as needed for chest pain.  11.  Ramipril 5 mg daily.  12.  Zolpidem 10 mg oral once a day.  13.  Tadalafil 10 mg oral daily.   REVIEW OF SYSTEMS:  CONSTITUTIONAL: No fatigue, weakness.  EYES: No blurred vision, pain, or redness.  ENT: No tinnitus, ear pain, hearing loss.  RESPIRATORY: No shortness of breath or cough.  CARDIOVASCULAR: Has the chest pain radiating to the left arm. History of CAD.  GASTROINTESTINAL: No nausea, vomiting, diarrhea.  GENITOURINARY: No dysuria, hematuria, frequency.  ENDOCRINE: No polyuria, nocturia, or thyroid problems.  HEMATOLOGIC AND LYMPHATIC: No anemia, easy bruising, or bleeding.  INTEGUMENTARY: No acne, rash, lesion.  MUSCULOSKELETAL: No back pain, arthritis.  NEUROLOGIC: No focal numbness, weakness, seizures. Did have the episode of syncope.   PHYSICAL EXAMINATION: VITAL SIGNS: Temperature 98.3, pulse 76, blood pressure 175/86, and saturating 95% on room air.  GENERAL: Obese, Caucasian male patient lying in bed, comfortable, conversational, cooperative with exam.  PSYCHIATRIC: Alert and oriented x3. Mood and affect appropriate. Judgment intact.  HEENT: Atraumatic, normocephalic. Oral mucosa moist and pink. External ears and nose normal. No pallor. No icterus. Pupils are equally round and reactive to light.  NECK: Supple. No thyromegaly. No palpable lymph nodes.  Trachea midline. No carotid bruit or JVD.  CARDIOVASCULAR: S1 and S2 without any murmurs. Peripheral pulses 2+. Has tenderness in the midsternal area which worsens with moving his left arm.  GASTROINTESTINAL: Soft abdomen, nontender. Bowel sounds present. No organosplenomegaly palpable.  SKIN: Warm and dry. No petechiae, rash, ulcers.  MUSCULOSKELETAL: No joint swelling,  redness, effusion of the large joints. Normal muscle tone. NEUROLOGIC: Motor strength 5/5 in upper and lower extremities.  LYMPHATIC: No cervical lymphadenopathy.   LABORATORY AND DIAGNOSTICS: Glucose 118, BUN 18, creatinine 1.09. AST, ALT, alkaline phosphatase, and bilirubin normal. Troponin less than 0.02. WBC 9.9, hemoglobin 10.8, and platelets of 260. INR 1.   CT of the head without contrast shows no acute abnormalities.   Chest x-ray, portable, showed no acute cardiopulmonary disease.   EKG shows poor R wave progression, normal sinus rhythm.   ASSESSMENT AND PLAN: 1.  Syncope in a patient with past problems with low blood pressure causing syncope. The patient feels as if his AICD had fired. He does have history of ventricular tachycardia. After this he has onset of chest pain, which is tender, likely musculoskeletal chest pain. We will admit the patient on telemetry floor, get 2 more sets of cardiac enzymes. Request Dr. Rockey Situ see the patient. The patient was supposed to schedule cardiac cath today. We will see if this procedure can be done while the patient is in the hospital since he has chest pain. Continue home medications. Check orthostatic vitals.  2.  Hypertension. Elevated in the Emergency Room. Likely from the pain and acute stress. We will monitor and make changes to medications as needed. For now will continue medications.  3.  Deep vein thrombosis prophylaxis with Lovenox.   CODE STATUS: FULL.  TIME SPENT TODAY ON THIS CASE: 40 minutes.  ____________________________ Leia Alf Katerine Morua, MD srs:sb D: 06/28/2013 07:49:20 ET T: 06/28/2013 08:34:35 ET JOB#: [id number]  cc: Alveta Heimlich R. Darvin Neighbours, MD, <Dictator> Minna Merritts, MD Neita Carp MD ELECTRONICALLY SIGNED 07/01/2013 2:38

## 2014-11-18 NOTE — Consult Note (Signed)
General Aspect Mr. Zeoli is a 59 -year-old gentleman with a history of coronary artery disease, 3 stents placed to his mid LAD, one stent to the PDA, one stent to the OM 3 in February 2008 with episodes of chest pressure during his divorce with stress test at [facility], followup cardiac catheterization at [facility], History of syncope x4 episodes total over the past several years.  He is followed by Dr. Virl Axe for ICD. he presents with syncope last thursday, malaise, chest pain.  He reports significant stress at home. He had an argument with his seperated wife last thursday. It became "heated" and he "passed out". he hit his head on teh table, received a knot on his head. Since then he has not felt well, significant headache. Some chest pains this Am, toook several NTG and came to the ER.      May 2013, he had  ventricular tachycardia while in the emergency room while at work. He was diaphoretic, required resuscitation. He was transferred to [facility] and had a cardiac catheterization with no PCI. Chronically occluded LAD, moderately diseased RCA. He had defibrillator placed 12/01/2011 presumably given his large anterior scar and the VT requiring defibrillation in the emergency room.  Emergency room visit 08/17/2012 for chest pain. Cardiac enzymes negative. Discharged home 1 previous episode of nausea, lightheadedness, near syncope.  run of nonsustained VT on 11/06/2012, did not correlate with symptoms    MRI done of his heart in February 2008 showed anterior and anterolateral region was nonviable. Ejection fraction by catheterization at that time was 35%.    His cardiac catheterization dated February 2008 documents 40% proximal RCA disease, 40% mid left circumflex, 80% mid LAD, small D2 and D3.   Present Illness . SOCIAL HISTORY: The patient is a past smoker. Occasional alcohol. No illicit drugs. 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    Family hx:  parents with CAD   Physical Exam:  GEN well developed, well nourished, no acute distress   HEENT hearing intact to voice, moist oral mucosa   NECK supple   RESP normal resp effort  clear BS   CARD Regular rate and rhythm  No murmur   ABD denies tenderness  soft   EXTR negative edema   SKIN normal to palpation   NEURO motor/sensory function intact   PSYCH alert, A+O to time, place, person, good insight   Review of Systems:  Subjective/Chief Complaint headache, chest pain, malaise   General: Fatigue  Weakness   Skin: No Complaints   ENT: No Complaints   Eyes: No Complaints   Neck: No Complaints   Respiratory: No Complaints   Cardiovascular: Chest pain or discomfort   Gastrointestinal: No Complaints   Genitourinary: No Complaints   Vascular: No Complaints   Musculoskeletal: No Complaints   Neurologic: No Complaints   Hematologic: No Complaints   Endocrine: No Complaints   Psychiatric: Anxiety   Review of Systems: All other systems were reviewed and found to be negative   Medications/Allergies Reviewed Medications/Allergies reviewed     Ventricular Tacycardia:    Defibrillator:    Stent - Cardiac:        Admit Diagnosis:   SYNCOPE CHEST PAIN.: Onset Date: 28-Jun-2013, Status: Active, Description: SYNCOPE CHEST PAIN.  Home Medications: Medication Instructions Status  Aspir 81 81 mg oral tablet 1 tab(s) orally once a day Active  atorvastatin 80 mg oral tablet 1 tab(s)  orally once a day (at bedtime) Active  carvedilol 3.125 mg oral tablet 1 tab(s) orally 2 times a day Active  ezetimibe 10 mg oral tablet 1 tab(s) orally once a day Active  hydrochlorothiazide 25 mg oral tablet 1 tab(s) orally once a day Active  nitroglycerin 0.3 mg sublingual tablet 1 tab(s) sublingual every 5 minutes, As Needed Active  ramipril 5 mg oral capsule 1 cap(s) orally once a day Active  tadalafil 10 mg oral tablet 1 tab(s) orally once a  day Active  zolpidem 10 mg oral tablet 1 tab(s) orally once a day (at bedtime) Active  furosemide 40 mg oral tablet 0.5 tab(s) orally once a day Active  Klor-Con 10 oral tablet, extended release 1 tab(s) orally once a day Active  atorvastatin 80 mg oral tablet 1 tab(s) orally once a day Active  clopidogrel 75 mg oral tablet 1 tab(s) orally once a day Active  amiodarone 200 mg oral tablet 1 tab(s) orally 5 times a week on Monday, Tuesday, Wednesday, Thursday, and Friday Active   Lab Results:  Hepatic:  01-Dec-14 03:36   Bilirubin, Total 0.3  Alkaline Phosphatase 96 (45-117 NOTE: New Reference Range 06/18/13)  SGPT (ALT) 36  SGOT (AST) 29  Total Protein, Serum 7.4  Albumin, Serum  3.1  Routine Chem:  01-Dec-14 03:36   Glucose, Serum  118  BUN 18  Creatinine (comp) 1.09  Sodium, Serum 137  Potassium, Serum 3.6  Chloride, Serum 104  CO2, Serum 27  Calcium (Total), Serum 8.7  Osmolality (calc) 277  eGFR (African American) >60  eGFR (Non-African American) >60 (eGFR values <33m/min/1.73 m2 may be an indication of chronic kidney disease (CKD). Calculated eGFR is useful in patients with stable renal function. The eGFR calculation will not be reliable in acutely ill patients when serum creatinine is changing rapidly. It is not useful in  patients on dialysis. The eGFR calculation may not be applicable to patients at the low and high extremes of body sizes, pregnant women, and vegetarians.)  Anion Gap  6  Cardiac:  01-Dec-14 03:36   Troponin I < 0.02 (0.00-0.05 0.05 ng/mL or less: NEGATIVE  Repeat testing in 3-6 hrs  if clinically indicated. >0.05 ng/mL: POTENTIAL  MYOCARDIAL INJURY. Repeat  testing in 3-6 hrs if  clinically indicated. NOTE: An increase or decrease  of 30% or more on serial  testing suggests a  clinically important change)  CK, Total 78  CPK-MB, Serum 1.7 (Result(s) reported on 28 Jun 2013 at 04:07AM.)    09:11   Troponin I < 0.02 (0.00-0.05 0.05 ng/mL  or less: NEGATIVE  Repeat testing in 3-6 hrs  if clinically indicated. >0.05 ng/mL: POTENTIAL  MYOCARDIAL INJURY. Repeat  testing in 3-6 hrs if  clinically indicated. NOTE: An increase or decrease  of 30% or more on serial  testing suggests a  clinically important change)    13:06   Troponin I < 0.02 (0.00-0.05 0.05 ng/mL or less: NEGATIVE  Repeat testing in 3-6 hrs  if clinically indicated. >0.05 ng/mL: POTENTIAL  MYOCARDIAL INJURY. Repeat  testing in 3-6 hrs if  clinically indicated. NOTE: An increase or decrease  of 30% or more on serial  testing suggests a  clinically important change)  Routine Hem:  01-Dec-14 03:36   WBC (CBC) 9.1  RBC (CBC) 4.59  Hemoglobin (CBC) 13.8  Hematocrit (CBC) 41.1  Platelet Count (CBC) 260 (Result(s) reported on 28 Jun 2013 at 03:55AM.)  MCV 89  MCH 30.1  MCHC 33.7  RDW 14.1   EKG:  Interpretation EKG shows normal sinus rhythm with old anterior infarct, new ST and T wave abnormality in V3 to V6, ST abnormality in 2, 3, aVF as well as 1 and aVL   Radiology Results: XRay:    01-Dec-14 03:40, Chest Portable Single View  Chest Portable Single View   REASON FOR EXAM:    CP  COMMENTS:       PROCEDURE: DXR - DXR PORTABLE CHEST SINGLE VIEW  - Jun 28 2013  3:40AM     CLINICAL DATA:  Sternal chest pain, defibrillator activity November  27th.    EXAM:  PORTABLE CHEST - 1 VIEW    COMPARISON:  Chest radiograph August 17, 2012    FINDINGS:  Cardiac silhouette is unremarkable, status post median sternotomy.  Single lead left cardiac defibrillator in situ with lead tip  projecting in right ventricle. Lungs are clear, no pleural effusions  or focal consolidations. No pneumothorax. Soft tissue planes and  included osseous structures are nonsuspicious.     IMPRESSION:  No acute cardiopulmonary process.      Electronically Signed    By: Elon Alas    On: 06/28/2013 04:41         Verified By: Ricky Ala, M.D.,     NTG: Unknown  Vital Signs/Nurse's Notes: **Vital Signs.:   01-Dec-14 17:57  Vital Signs Type Q 4hr  Temperature Temperature (F) 98  Celsius 36.6  Temperature Source oral  Pulse Pulse 70  Respirations Respirations 18  Systolic BP Systolic BP 448  Diastolic BP (mmHg) Diastolic BP (mmHg) 64  Mean BP 78  Pulse Ox % Pulse Ox % 99  Pulse Ox Activity Level  At rest  Oxygen Delivery Room Air/ 21 %    Impression Mr. Wendling is a 80 -year-old gentleman with a history of coronary artery disease, 3 stents placed to his mid LAD, one stent to the PDA, one stent to the OM 3 in February 2008 with episodes of chest pressure during his divorce with stress test at [facility], followup cardiac catheterization at [facility], History of syncope x4 episodes total over the past several years.  He is followed by Dr. Virl Axe for ICD. he presents with syncope last thursday, malaise, chest pain.  1) Chest pain Previous EKG changes noted in clinic stuttering chest pain over the past few weeks syncope, concerning for angina --Previously I had suggested cardiac cath as an outpt for new dramatic EKG changes and sx. He had refused. Will schedule to do cath tomorrow, he prefers radial access.  2) CAD: PCI x 3 in the past severe underlying CAD, cath tomorrow  3) Syncope ICD in place, high risk for NSVT given h/o of VT arrest and large region of anterior wall scar. Will try to arrange ICD info down load  4) Anxiety significant stress at home syncope seemed to be precipitated by arguement with ex-wife   Electronic Signatures: Ida Rogue (MD)  (Signed 01-Dec-14 19:05)  Authored: General Aspect/Present Illness, History and Physical Exam, Review of System, Past Medical History, Health Issues, Home Medications, Labs, EKG , Radiology, Allergies, Vital Signs/Nurse's Notes, Impression/Plan   Last Updated: 01-Dec-14 19:05 by Ida Rogue (MD)

## 2014-11-18 NOTE — Discharge Summary (Signed)
PATIENT NAME:  Ian Mills, Ian Mills MR#:  [id number] DATE OF BIRTH:  10-18-1955  DATE OF ADMISSION:  06/29/2013 DATE OF DISCHARGE:  06/30/2013   ADMISSION DIAGNOSIS:  Syncope.  DISCHARGE DIAGNOSES: 1.  Syncope.  2.  Chest pain. No myocardial infarction, status post cardiac catheterization on the 2nd of December 2014 by Dr. Fletcher Anon.  A drug-eluting stent was placed in 70% occluded proximal right coronary artery, 0% residual stenosis after intervention was noted.  3.  Hypertension.  4.  Hyperlipidemia with LDL of 50.  5.  Ischemic cardiomyopathy with ejection fraction of 20%.   DISCHARGE CONDITION: Stable.   DISCHARGE MEDICATIONS: The patient is to continue furosemide 40 mg 1/2 tablet once daily, Klor-Con 10 mg once daily, amiodarone 200 mg 5 times daily, aspirin 81 mg p.o. daily, atorvastatin 80 mg p.o. daily at bedtime, carvedilol 3.125 mg p.o. twice daily, Zetia 10 mg p.o. daily, HCTZ 25 mg p.o. daily, nitroglycerin 0.3 mg sublingually every 5 minutes as needed, ramipril 5 mg p.o. daily, tadalafil 10 mg p.o. daily, zolpidem 10 mg p.o. at bedtime, clopidogrel 75 mg p.o. daily.   HOME OXYGEN: None.   DIET: Two grams, low fat, low cholesterol, regular consistency.   ACTIVITY LIMITATIONS: As tolerated.    FOLLOWUP APPOINTMENT: With Dr. Fletcher Anon, Dr. Rockey Situ in 2 days after discharge.   CONSULTANTS: Dr. Rockey Situ.   RADIOLOGIC STUDIES: Chest x-ray, portable single view, 1st of December 2014, showed no acute cardiopulmonary process. CT scan of head without contrast 1st of December 2014 revealed no acute intracranial process, normal noncontrast CT of the head for age. Cardiac catheterization 2nd of December 2014, done by Dr. Kathlyn Sacramento, hemodynamic assessment demonstrated no systemic hypertension and moderately elevated left ventricular end-diastolic pressure. There was significant 3-vessel coronary artery disease with chronically occluded LAD and patent stent to the left circumflex.  The disease in the  RCA seemed worse than most on recent cardiac catheterization. A drug-eluting stent was performed to the 70% lesion of proximal RCA. Following intervention, there was 0% residual stenosis.  A pressure wire interrogation was performed for PCI.  FFR ratio was 0.74. Global left ventricular function was severely depressed, ejection fraction estimated was 20%. Analysis of regional contractile function demonstrated anterolateral akinesis and apical akinesis. Patient's management should include aggressive medical therapy and cardiac rehabilitation program. Recommend lifelong dual antiplatelet therapy, maximize heart failure medications and consider adding on aldosterone-blocking medications. Stop HCTZ if needed.   HOSPITAL COURSE:  The patient is a 59 year old Caucasian male with past medical history significant for history of coronary artery disease, history of cardiomyopathy, who presented to the hospital on the 1st of December 2014 with syncopal episodes as well as chest pains. Please refer to Dr. Boykin Reaper admission note on the 1st of December 2014. On arrival to the hospital, the patient's EKG showed poor R wave progression, no acute ST-T changes. Cardiac enzymes were normal. He was still having chest pains and his blood pressure was mildly elevated to 175/86. O2  saturation was 95% on room air. His pulse was 76 and temperature was 98.3. Physical exam was otherwise unremarkable. The patient's lab data done in the Emergency Room otherwise showed normal BMP but blood glucose level was elevated to 118. The patient's liver enzymes revealed albumin level of 3.1, otherwise normal. Cardiac enzymes x 3 were within normal limits. The patient's CBC was within normal limits. Coagulation panel was normal. The patient was admitted to the hospital for further evaluation. His cardiac enzymes were cycled and since he  had  continued chest pains cardiology consultation was obtained and the cardiologist felt that the patient would  benefit from cardiac catheterization since he has significant coronary artery disease, history of coronary artery disease as well as frequent syncopal episodes. He underwent  cardiac catheterization on the 2nd of December 2014 and proximal RCA stenosis was noted at 70%.  A drug-eluding stent was placed with no residual stenosis. The patient is being discharged home with no chest pains. He felt like a new human being. On the day of discharge, the patient's vital signs, temperature was 98.4, pulse was 70, respiratory rate was 18, blood pressure 103/61, saturation was 99% on room air at rest. The patient is to continue Plavix and prescription for Plavix was given for him on the day of discharge. The patient's lipid panel was checked, patient's LDL was found to be 50, total cholesterol was 107, triglycerides were 118 and HDL was 33. The patient was advised to continue low fat, low cholesterol diet.   TIME SPENT: 40 minutes on this patient.   ____________________________ Theodoro Grist, MD rv:cs D: 06/30/2013 16:20:00 ET T: 06/30/2013 19:11:29 ET JOB#: [id number]  cc: Rogue Jury A. Fletcher Anon, MD Minna Merritts, MD Theodoro Grist, MD, <Dictator>  Addison MD ELECTRONICALLY SIGNED 07/07/2013 15:34

## 2014-11-19 NOTE — Discharge Summary (Signed)
PATIENT NAME:  Ian Mills, PRUIETT MR#:  [id number] DATE OF BIRTH:  08-01-1955  DATE OF ADMISSION:  11/01/201511/07/2013 DATE OF DISCHARGE:  11/02/201511/08/2013  ADMISSION DIAGNOSIS: Syncope.   DISCHARGE DIAGNOSES: 1.  Syncopal episode, likely not cardiac in nature, likely vasovagal in nature, possibly related to stress.  2.  Ischemic cardiomyopathy with ejection fraction of 20% by cardiac catheterization in 2014.  3.  History of coronary artery disease with multiple stenting.  4.  History of essential hypertension. 5.  History of hyperlipidemia.   CONSULTATIONS: Cardiology.   DIAGNOSTIC DATA: Troponins x3 are negative.   Carotid Dopplers were negative for hemodynamically significant stenosis.  A 2-D echocardiogram is pending.   Discharge white blood cells 5.9, hemoglobin 11, hematocrit 32, platelets 181,000. Sodium 137, potassium 3.9, chloride 106, bicarb 26, BUN 12, creatinine 1.04.   HOSPITAL COURSE: A very pleasant 61Y37 year old male with known cardiomyopathy, EF of 20% with an AICD placed who presented with syncope. For further details, please refer to the H and P.  1.  Syncopal episode. His AICD was interrogated on November 1st revealing no recent episodes of V. tach or firing per the [employer]. His syncopal episode is most likely related to stress or vasovagal syncope. He has an echocardiogram pending, which I did speak with the cardiology team. The patient can follow up as an outpatient on the results of this echocardiogram. He will follow up this week with Dr. Rockey Situ.  2.  Ischemic cardiomyopathy, EF of 20% by cardiac cath in 2014. The patient is on p.o. Lasix at home as well as Coreg, amiodarone and ramipril. 3.   History of CAD. The patient was continued on Plavix, Zetia and Lipitor. The cath in December 2014 demonstrated chronically occluded LAD, PCI/ DES, RCA, and patent stent along the left circumflex. 4.  Essential hypertension, controlled on current  medications.  5.  History of hyperlipidemia. The patient's Lipitor will be continued.   DISCHARGE MEDICATIONS: 1.  K-Chlor 10 mEq daily.  2.  Amiodarone 200 mg 5 times a week, on Monday, Tuesday, Wednesday, Thursday,  and Friday. 3.  Atorvastatin 80 mg at bedtime.  4.  Coreg 3.125 daily.  5.  HCTZ 25 mg as needed daily.  6.  Ramipril 5 mg daily.  7.  Plavix 75 mg daily. 8.  Zetia 10 mg daily.  9.  Lasix 40 mg daily.   DISCHARGE DIET: Low sodium.   DISCHARGE ACTIVITY: As tolerated.   DISCHARGE FOLLOWUP: The patient will follow up this week with Dr. Rockey Situ.   PHYSICAL EXAMINATION: VITAL SIGNS: Temperature 98.4, pulse 60, respirations 18, blood pressure 110/60, 98% on room air.  GENERAL: The patient is alert and oriented, not in acute distress.  LUNGS: Clear to auscultation without crackles, rales, rhonchi or wheezing. Normal to percussion. No dullness to percussion. Good chest expansion. CARDIOVASCULAR: Regular rate and rhythm. No murmurs, gallops, or rubs. PMI is laterally displaced.  ABDOMEN: Bowel sounds positive. Nontender. Nondistended. No hepatosplenomegaly.  EXTREMITIES: No clubbing, cyanosis or edema. SKIN: Without rash or lesions.   The patient was stable for discharge.   TIME SPENT: Approximately 40 minutes.    ____________________________ Donell Beers. Benjie Karvonen, MD spm:sb D: 05/30/2014 10:35:00 ET T: 05/30/2014 16:57:54 ET JOB#: [id number]  cc: Minna Merritts, MD Kauri Garson P. Benjie Karvonen, MD, <Dictator>   Donell Beers Kasy Iannacone MD ELECTRONICALLY SIGNED 05/31/2014 13:11

## 2014-11-19 NOTE — H&P (Signed)
PATIENT NAME:  Ian Mills, Ian Mills MR#:  [id number] DATE OF BIRTH:  1956-07-09  DATE OF ADMISSION:  05/29/2014  REFERRING PHYSICIAN:  Delman Kitten, MD  PRIMARY CARE PHYSICIAN:  None.  REASON FOR ADMISSION:  Recurrent syncope.   HISTORY OF PRESENT ILLNESS: The patient is a 59 year old male with a history of paroxysmal ventricular tachycardia status post defibrillator placement. He is followed by Dr. Rockey Situ. Also has a history of coronary artery disease status post PTCA with stent placement. Presents to the Emergency Room after 2 discrete episodes last night of dizziness followed by syncope. States that he blacked out twice after feeling dizzy.  He is not sure if his defibrillator  fired or not. Presents to the Emergency Room today in no acute distress but is now admitted for further evaluation.   PAST MEDICAL HISTORY: 1.  ASCVD status post PTCA with stent placement. 2.  Paroxysmal ventricular tachycardia.  3.  Benign hypertension.  4.  Hyperlipidemia.  5.  Status post defibrillator placement.  6.  Tobacco abuse.   MEDICATIONS: 1.  Ambien 10 mg p.o. at bedtime.  2.  Cialis 10 mg p.o. daily.  3.  Altace 5 mg p.o. daily.  4.  Nitrostat 0.3 mg sublingually p.r.n. chest pain.  5.  Klor-Con 10 mEq p.o. daily.  6.  Hydrochlorothiazide 25 mg p.o. daily.  7.  Lasix 20 mg p.o. daily.  8.  Zetia 10 mg p.o. daily.  9.  Plavix 75 mg p.o. daily.  10.  Coreg 3.125 mg p.o. b.i.d.  11.  Lipitor 80 mg p.o. at bedtime.  12.  Aspirin 81 mg p.o. daily.  13.  Amiodarone 200 mg p.o. 5 times per week.   ALLERGIES: No apparent drug allergies.   SOCIAL HISTORY:  The patient smokes less than half pack per day. Denies alcohol abuse.   FAMILY HISTORY: Positive coronary artery disease, stroke and hypertension. Negative for diabetes. Negative for colon or prostate cancer.   REVIEW OF SYSTEMS:   CONSTITUTIONAL: No fever or change in weight.  EYES: No blurred or double vision. No glaucoma.  ENT: No tinnitus or  hearing loss. No nasal discharge or bleeding. No difficulty swallowing.  RESPIRATORY: No cough or wheezing. Denies hemoptysis. No painful respiration.  CARDIOVASCULAR: No chest pain or orthopnea. No palpitations.  GASTROINTESTINAL: No nausea, vomiting or diarrhea. No abdominal pain. No change in bowel habits.  GENITOURINARY: No dysuria or hematuria. No incontinence.  ENDOCRINE: No polyuria or polydipsia. No heat or cold intolerance.  HEMATOLOGIC: The patient denies anemia, easy bruising or bleeding.  LYMPHATIC: No swollen glands.  MUSCULOSKELETAL: The patient has some pain in his neck and head from hitting his head when falling. Denies back, shoulder, knee or hip pain. No gout.  NEUROLOGIC: No numbness or migraines. Denies stroke or seizures.  PSYCHOLOGICAL: The patient denies anxiety, insomnia or depression.   PHYSICAL EXAMINATION: GENERAL: The patient is in no acute distress.  VITAL SIGNS: Currently remarkable for a blood pressure of 137/69 with a heart rate of 89, respiratory rate of 18, temperature of 97.7.  Sat 98% on room air.  HEENT: Normocephalic, atraumatic. Pupils equally round and reactive to light and accommodation. Extraocular movements are intact. Sclerae are not icteric. Conjunctivae are clear.  Oropharynx is clear.  NECK: Supple without JVD. No adenopathy or thyromegaly is noted.  LUNGS: Clear to auscultation and percussion without wheezes, rales or rhonchi. No dullness. Respiratory effort is normal.  CARDIAC: Regular rate and rhythm with normal S1, S2. No significant rubs,  murmurs or gallops. PMI is nondisplaced. Chest wall is nontender.  ABDOMEN: Soft, nontender with normoactive bowel sounds. No organomegaly or masses were appreciated. No hernias or bruits were noted.  EXTREMITIES: Without clubbing, cyanosis or edema. Pulses were 2+ bilaterally.  SKIN: Warm and dry without rash or lesions.  NEUROLOGIC: Cranial nerves II through XII grossly intact. Deep tendon reflexes were  symmetric. Motor and sensory exam is nonfocal.  PSYCHIATRIC: Revealed a patient who is alert and oriented to person, place and time. He was cooperative and used good judgment.   LABORATORY DATA: EKG revealed sinus rhythm with no acute ischemic changes. An old anterior myocardial infarction was noted. Head CT was negative. Chest x-ray was unremarkable. His troponin was less than 0.02. Glucose 83 with a BUN of 11, creatinine 1.12 with a GFR of greater than 60. White count was 7.5 with a hemoglobin of 12.   ASSESSMENT: 1.  Recurrent syncope.  2.  Paroxysmal ventricular tachycardia with defibrillator implant.  3.  Atherosclerotic cardiovascular disease status post previous myocardial infarction.  4.  Status post coronary artery bypass graft. 5.  Anemia of chronic disease.  6.  Benign hypertension.  7.  Hyperlipidemia.   PLAN: The patient will be observed on telemetry. We will follow serial cardiac enzymes and obtain an echocardiogram. We will consult cardiology and try to get his defibrillator assessed. Continue his outpatient regimen for now including amiodarone. Follow up routine labs in the morning. Further treatment and evaluation will depend upon the patient's progress.   Total time spent on this patient was 45 minutes.   ____________________________ Leonie Douglas Doy Hutching, MD jds:jw D: 05/29/2014 12:57:00 ET T: 05/29/2014 16:30:13 ET JOB#: [id number] Samarah Hogle D Wallace Gappa MD ELECTRONICALLY SIGNED 05/31/2014 15:08

## 2014-11-19 NOTE — Consult Note (Signed)
General Aspect Primary Cardiologist: Dr. Rockey Situ, MD Primary Electrophysiologist: Dr. Caryl Comes, MD ___________________  59 year old male with history of CAD s/p multiple stenting procedures (CXR indicates CABG however he has not had a CABG), ICM with VT (EF 20% on 06/2013) s/p [employer] ICD placement 11/2011, multiple syncopal episodes, HTN, and HLD who presented to [facility] on 05/29/2014 with 2 syncopal episodes at his ex-wife's house on Halloween night lasting several seconds each. His ICD was interrogated on 05/29/2014 revealing no recent episodes of VT or firing. His last episode of tachycardia was several months ago per [employer] rep and on call cardiologist.  ___________________  PMH:  1. CAD s/p multiple stenting 2. ICM (EF 20%) s/p [employer] ICD 11/2011  3. VT   4. Multiple syncopal episodes 5. HTN 6. HLD ___________________   Present Illness 59 year old male with the above problem list who presented to [facility] on 05/29/2014 with 2 syncopal episodes at his ex-wife's house on Halloween night lasting several seconds each. His ICD was interrogated on 05/29/2014 revealing no recent episodes of VT or firing. His last episode of tachycardia was several months ago per [employer] rep and on call cardiologist.   Patient with signifcant h/o CAD s/p multiple stenting over the years to the [place], PDA, [place] in 2008, EF was 35%. In 08/2006 cardiac MRI indicated the anterior poriton of the heart was noviable. In May 2013 he suffered an episode of VT while at work. He was taken to [facility] and required resuscitation. Cardiac cath reuired no PCI. Cath did show a chronically occluded LAD, moderately diseased RCA. He had defibrillator placed 12/01/2011 presumably given his large anterior scar and the VT requiring defibrillation in the emergency room. He had another episode of NSVT on 11/06/2012 that did not correlate with the symptoms. He had an admission to [facility] on 06/28/2013 for syncope, malaise, and  chest pain. He had recently had a fight with his wife. At that time had syncope. Cardiac enzymes were negative. Cardiac cath done on 06/29/2013 - showed 70% stenosis pRCA --> PCI/DES. FFR used to confirm stenosis. Patent stent to LCx and chronically occluded LAD. EF 20%.   He was last seen by Dr. Caryl Comes 03/09/2013. ICD was functioning well. No changes were made.   He comes in today after going to his ex-wife's house on Halloween night to visit with his kids. While voiding he suffered a brief couple of second syncopal episode. He then a few minutes later suffered another couple of second syncopal episode. When he goes into VT he states he can feel it coming on and he never felt this coming on. It just happened. No associated chest pain, SOB, nausea, vomiting, loss of bowel, or bladder function.  His ICD was interrogated as above. He was started on 75 mL/hr IV fluid.   Upon his arrival to [facility] his TnI were found to be negative x 3. EKG NSR, 68, left atrial enlargement, left axis, TWI V2-V5, artifiact V6. K+ 3.9, SCr1.12, WBC 7.5, CXR c/w CHF, CT head no acute pathology.   Physical Exam:  GEN well developed, well nourished, no acute distress   HEENT PERRL, hearing intact to voice, moist oral mucosa   NECK supple   RESP normal resp effort  crackles  bilateral crackles   CARD Regular rate and rhythm  Normal, S1, S2  No murmur   ABD denies tenderness  soft  normal BS   EXTR negative edema   SKIN normal to palpation  NEURO cranial nerves intact   PSYCH alert, A+O to time, place, person, good insight   Review of Systems:  General: No Complaints   Skin: No Complaints   ENT: No Complaints   Eyes: No Complaints   Neck: No Complaints   Respiratory: No Complaints   Cardiovascular: No Complaints   Gastrointestinal: No Complaints   Genitourinary: No Complaints   Vascular: No Complaints   Musculoskeletal: No Complaints   Neurologic: syncope   Hematologic: No Complaints    Endocrine: No Complaints   Psychiatric: No Complaints   Review of Systems: All other systems were reviewed and found to be negative   Medications/Allergies Reviewed Medications/Allergies reviewed   Family & Social History:  Family and Social History:  Family History Coronary Artery Disease  Hypertension  Diabetes Mellitus   Social History positive ETOH, negative Illicit drugs   + Tobacco Current (within 1 year)  5-7 cigarettes/day   Place of Living Home     Ventricular Tacycardia:    Defibrillator:    Stent - Cardiac:   Home Medications: Medication Instructions Status  atorvastatin 80 mg oral tablet 1 tab(s) orally once a day (at bedtime) Active  carvedilol 3.125 mg oral tablet 1 tab(s) orally 2 times a day Active  hydrochlorothiazide 25 mg oral tablet 1 tab(s) orally once a day, As Needed Active  ramipril 5 mg oral capsule 1 cap(s) orally once a day Active  clopidogrel 75 mg oral tablet 1 tab(s) orally once a day Active  Zetia 10 mg oral tablet 1 tab(s) orally once a day Active  furosemide 40 mg oral tablet 1 tab(s) orally once a day Active  Klor-Con 10 oral tablet, extended release 1 tab(s) orally once a day Active  amiodarone 200 mg oral tablet 1 tab(s) orally 5 times a week on Monday, Tuesday, Wednesday, Thursday, and Friday Active   Lab Results:  Routine Chem:  02-Nov-15 04:55   Glucose, Serum  104  BUN 12  Creatinine (comp) 1.15  Sodium, Serum 137  Potassium, Serum 3.9  Chloride, Serum 106  CO2, Serum 26  Calcium (Total), Serum  8.1  Anion Gap  5 (Result(s) reported on 30 May 2014 at 05:41AM.)  Osmolality (calc) 274  Cardiac:  01-Nov-15 11:36   Troponin I < 0.02 (0.00-0.05 0.05 ng/mL or less: NEGATIVE  Repeat testing in 3-6 hrs  if clinically indicated. >0.05 ng/mL: POTENTIAL  MYOCARDIAL INJURY. Repeat  testing in 3-6 hrs if  clinically indicated. NOTE: An increase or decrease  of 30% or more on serial  testing suggests a  clinically important  change)    16:35   Troponin I < 0.02 (0.00-0.05 0.05 ng/mL or less: NEGATIVE  Repeat testing in 3-6 hrs  if clinically indicated. >0.05 ng/mL: POTENTIAL  MYOCARDIAL INJURY. Repeat  testing in 3-6 hrs if  clinically indicated. NOTE: An increase or decrease  of 30% or more on serial  testing suggests a  clinically important change)    19:59   Troponin I < 0.02 (0.00-0.05 0.05 ng/mL or less: NEGATIVE  Repeat testing in 3-6 hrs  if clinically indicated. >0.05 ng/mL: POTENTIAL  MYOCARDIAL INJURY. Repeat  testing in 3-6 hrs if  clinically indicated. NOTE: An increase or decrease  of 30% or more on serial  testing suggests a  clinically important change)  Routine Hem:  02-Nov-15 04:55   WBC (CBC) 5.9  RBC (CBC)  3.42  Hemoglobin (CBC)  10.6  Hematocrit (CBC)  32.2  Platelet Count (CBC) 181  MCV  94  MCH 31.0  MCHC 32.9  RDW 14.4  Neutrophil % 51.3  Lymphocyte % 32.3  Monocyte % 12.8  Eosinophil % 2.4  Basophil % 1.2  Neutrophil # 3.0  Lymphocyte # 1.9  Monocyte # 0.8  Eosinophil # 0.1  Basophil # 0.1 (Result(s) reported on 30 May 2014 at 05:41AM.)   EKG:  EKG Interp. by me   Interpretation NSR, 68, left atrial enlargement, left axis, TWI V2-V5, artifact V6   Radiology Results: XRay:    01-Nov-15 11:00, Chest Portable Single View  Chest Portable Single View   REASON FOR EXAM:    Chest Pain  COMMENTS:       PROCEDURE: DXR - DXR PORTABLE CHEST SINGLE VIEW  - May 29 2014 11:00AM     CLINICAL DATA:  Hypertension.  Defibrillation.    EXAM:  PORTABLE CHEST - 1 VIEW    COMPARISON:  06/28/2013.    FINDINGS:  Mediastinum and hilar structures are normal. Prior CABG. Cardiac  pacer with lead tip in right ventricle. Mild cardiomegaly with  pulmonary vascular prominence and interstitial prominence. These  findings suggest mild congestive heart failure. No pneumothorax. Old  left clavicular fracture.     IMPRESSION:  1. Prior CABG.  Cardiac pacer noted lead tip  over right ventricle.  2. Findings consistent with mild congestive heart failure with  interstitial edema.      Electronically Signed    By: Marcello Moores  Register    On: 05/29/2014 11:36       Verified By: Osa Craver, M.D., MD  [place]:    01-Nov-15 13:44, US Carotid Doppler Bilateral  US Carotid Doppler Bilateral   REASON FOR EXAM:    syncope  COMMENTS:       PROCEDURE: [place]  - US CAROTID DOPPLER BILATERAL  - May 29 2014  1:44PM     CLINICAL DATA:  Syncope.    EXAM:  BILATERAL CAROTID DUPLEX ULTRASOUND    TECHNIQUE:  Pearline Cables scale imaging, color Doppler and duplex ultrasound were  performed of bilateral carotid and vertebral arteries in the neck.    COMPARISON:  Head CT 05/29/2014.  FINDINGS:  Criteria: Quantification of carotid stenosis is based on velocity  parameters that correlate the residual internal carotid diameter  with NASCET-based stenosis levels, using the diameter of the distal  internal carotid lumen as the denominator for stenosis measurement.    The following velocity measurements were obtained:    RIGHT    ICA:  105/46 cm/sec    CCA:  470/96GE/ZMO    SYSTOLIC ICA/CCA RATIO:  1.0  DIASTOLIC ICA/CCA RATIO:  1.5    ECA:  111 cm/sec    LEFT    ICA:  95/ 37 cm/sec    CCA:  294/76 cm/sec    SYSTOLIC ICA/CCA RATIO:  0.8    DIASTOLIC ICA/CCA RATIO:  1.3    ECA:  127 cm/sec  RIGHT CAROTID ARTERY: Mild moderate plaque right carotid bulb. No  flow-limiting stenosis.    RIGHT VERTEBRAL ARTERY:  Patent antegrade flow.    LEFT CAROTID ARTERY: Mild-to-moderate plaque left carotid bulb. No  flow-limiting stenosis.    LEFT VERTEBRAL ARTERY:  Patent antegrade flow.     IMPRESSION:  1. Mild-to-moderate bilateral carotid atherosclerotic vascular  disease. No flow limiting stenosis.  2. Vertebrals are patent with antegrade flow.  Electronically Signed    By: Marcello Moores  Register    On: 05/29/2014 14:18         Verified  By: Osa Craver, M.D., MD   CT:    01-Nov-15 11:28, CT Head Without Contrast  CT Head Without Contrast   REASON FOR EXAM:    fall after syncope last pm, abrasion back of head,   headache  COMMENTS:       PROCEDURE: CT  - CT HEAD WITHOUT CONTRAST  - May 29 2014 11:28AM     CLINICAL DATA:  Falling backwards last night. Injury to the back of  a head. Head and neck pain.    EXAM:  CT HEAD WITHOUT CONTRAST    CT CERVICAL SPINE WITHOUT CONTRAST    TECHNIQUE:  Multidetector CT imaging of the head and cervical spine was  performed following the standard protocol without intravenous  contrast. Multiplanar CTimage reconstructions of the cervical spine  were also generated.    COMPARISON:  None.    FINDINGS:  CT HEAD FINDINGS    No mass effect, midline shift, or acute intracranial hemorrhage.  Mastoid air cells are clear. Cranium is intact.    CT CERVICALSPINE FINDINGS  No fracture. No dislocation. There is reversed cervical lordosis.  Severe disc space narrowing is present C4-5 and C6-7 with posterior  osteophytic ridging. There is an element of spinal stenosis from  C3-4 through C6-7. Right foraminal narrow at C4-5 on the right, C4-5  on left, and C3 form the left secondary to uncovertebral  osteophytes. No obvious soft tissue injury. Unremarkable thyroid  gland.     IMPRESSION:  No acute intracranial pathology. No evidence of cervical spine  injury.      Electronically Signed    By: Maryclare Bean M.D.    On: 05/29/2014 12:12         Verified By: Jamas Lav, M.D.,    NTG: Unknown  Vital Signs/Nurse's Notes: **Vital Signs.:   02-Nov-15 04:40  Vital Signs Type Routine  Temperature Temperature (F) 98.4  Celsius 36.8  Temperature Source oral  Pulse Pulse 60  Respirations Respirations 18  Systolic BP Systolic BP 644  Diastolic BP (mmHg) Diastolic BP (mmHg) 60  Mean BP 76  Pulse Ox % Pulse Ox % 98  Pulse Ox Activity Level  At rest  Oxygen Delivery Room Air/ 21 %    Impression 59 year  old male with history of CAD s/p multiple stenting procedures (CXR indicates CABG however he has not had a CABG), ICM with VT (EF 20% on 06/2013) s/p [employer] ICD placement 11/2011, multiple syncopal episodes, HTN, and HLD who presented to [facility] on 05/29/2014 with 2 syncopal episodes at his ex-wife's house on Halloween night lasting several seconds each. His ICD was interrogated on 05/29/2014 revealing no recent episodes of VT or firing. His last episode of tachycardia was several months ago per [employer] rep and on call cardiologist.   1. Syncopal episode: -ICD interrogated on 05/29/2014 revealing no recent episodes of VT or firing per [facility] rep and on call cardiologist (05/29/2014) -Episode appears to be benign in etiology  -He has a history of syncope when he visits with his ex-wife (question social situation) -Has echo pending  -Follow up with PCP for labs  2. ICM s/p [employer] ICD placement 11/2011/VT: -EF 20% by cath 06/2013  -Stopped IV fluids  -Continue PO Lasix 40 mg daily at home dose, with one dose now (8:36 AM) -Continue Coreg 3.125 mg bid, amiodarone 200 mg daily, ramipril 5 mg daily  3. CAD s/p multiple stenting: -Continue Plavix 75 mg daily -Continue  Zetia 10 mg daily -Continue Lipitor 80 mg daily -S/p cath 06/2013 demonstrating chronically occluded LAD, PCI/DES pRCA, patent stent along LCx  4. HTN:  -Controlled  5. HLD: -Lipitor 80 mg   Electronic Signatures for Addendum Section:  Kathlyn Sacramento (MD) (Signed Addendum [phone] 12:56)  The patient was seen and examined. Agree with the above. Syncope with no evidence of arrhythmia or ICD shocks. seems vasovagal. Stop IVFs  and resume Lasix. No further work up is recommended at this time.   Electronic Signatures: Kathlyn Sacramento (MD)  (Signed [phone] 12:56)  Co-Signer: General Aspect/Present Illness, History and Physical Exam, Review of System, Family & Social History, Past Medical  History, Home Medications, Labs, EKG , Radiology, Allergies, Vital Signs/Nurse's Notes, Impression/Plan Rise Mu (PA-C)  (Signed [phone] 08:41)  Authored: General Aspect/Present Illness, History and Physical Exam, Review of System, Family & Social History, Past Medical History, Home Medications, Labs, EKG , Radiology, Allergies, Vital Signs/Nurse's Notes, Impression/Plan   Last Updated: 02-Nov-15 12:56 by Kathlyn Sacramento (MD)

## 2014-12-07 ENCOUNTER — Telehealth: Payer: Self-pay | Admitting: *Deleted

## 2014-12-07 NOTE — Telephone Encounter (Signed)
Placed samples at front desk for pick up. 

## 2014-12-07 NOTE — Telephone Encounter (Signed)
Pt asking for samples of Zetia,celas,asprin  He would also like coupons. He states last time he got some they were expired.

## 2014-12-19 ENCOUNTER — Telehealth: Payer: Self-pay | Admitting: Cardiovascular Disease

## 2014-12-19 NOTE — Telephone Encounter (Signed)
Printed Cialis coupon offline will put at front desk for pick up.

## 2014-12-19 NOTE — Telephone Encounter (Signed)
Cialis coupon expired Do we have any that are valid? Please call patient.

## 2014-12-28 ENCOUNTER — Other Ambulatory Visit: Payer: Self-pay | Admitting: Cardiovascular Disease

## 2015-01-01 ENCOUNTER — Other Ambulatory Visit: Payer: Self-pay | Admitting: Cardiovascular Disease

## 2015-01-02 ENCOUNTER — Telehealth: Payer: Self-pay | Admitting: Cardiovascular Disease

## 2015-01-02 NOTE — Telephone Encounter (Signed)
Patient needs Samples for Zetia, Aspirin, Cialis

## 2015-01-02 NOTE — Telephone Encounter (Signed)
Samples available for pick up of aspirin 81 mg and zetia. No samples of cialis available at this time.

## 2015-02-15 ENCOUNTER — Telehealth: Payer: Self-pay | Admitting: *Deleted

## 2015-02-15 NOTE — Telephone Encounter (Signed)
Patient calling the office for samples of medication:   1.  What medication and dosage are you requesting samples for? Ian Mills and Cialas   2.  Are you currently out of this medication? No   3. Are you requesting samples to get you through until a mail order prescription arrives? No for they are very expensive.

## 2015-02-15 NOTE — Telephone Encounter (Signed)
No zetia samples available at this time.

## 2015-02-28 ENCOUNTER — Encounter: Payer: Self-pay | Admitting: Internal Medicine

## 2015-02-28 ENCOUNTER — Ambulatory Visit (INDEPENDENT_AMBULATORY_CARE_PROVIDER_SITE_OTHER): Payer: 59 | Admitting: Internal Medicine

## 2015-02-28 VITALS — BP 130/72 | HR 64 | Ht 68.0 in | Wt 198.5 lb

## 2015-02-28 DIAGNOSIS — I255 Ischemic cardiomyopathy: Secondary | ICD-10-CM | POA: Diagnosis not present

## 2015-02-28 DIAGNOSIS — R079 Chest pain, unspecified: Secondary | ICD-10-CM | POA: Diagnosis not present

## 2015-02-28 DIAGNOSIS — Z95810 Presence of automatic (implantable) cardiac defibrillator: Secondary | ICD-10-CM | POA: Diagnosis not present

## 2015-02-28 MED ORDER — SACUBITRIL-VALSARTAN 24-26 MG PO TABS: 1.0000 | ORAL_TABLET | Freq: Two times a day (BID) | ORAL | Status: DC

## 2015-02-28 NOTE — Patient Instructions (Addendum)
Medication Instructions:  Your physician has recommended you make the following change in your medication:  STOP taking ramipril START taking Entresto  Labwork: Your physician recommends that you return for lab work in 2-3 weeks: CMET and TSH   Testing/Procedures: Your physician has requested that you have a lexiscan myoview. For further information please visit [email or web address]. Please follow instruction sheet, as given.  [place]  Your caregiver has ordered a Stress Test with nuclear imaging. The purpose of this test is to evaluate the blood supply to your heart muscle. This procedure is referred to as a "Non-Invasive Stress Test." This is because other than having an IV started in your vein, nothing is inserted or "invades" your body. Cardiac stress tests are done to find areas of poor blood flow to the heart by determining the extent of coronary artery disease (CAD). Some patients exercise on a treadmill, which naturally increases the blood flow to your heart, while others who are  unable to walk on a treadmill due to physical limitations have a pharmacologic/chemical stress agent called Lexiscan . This medicine will mimic walking on a treadmill by temporarily increasing your coronary blood flow.   Please note: these test may take anywhere between 2-4 hours to complete  PLEASE REPORT TO [place] AT THE FIRST DESK WILL DIRECT YOU WHERE TO GO  Date of Procedure:_Monday, August 8, 8:30am____________________________________  Arrival Time for Procedure:____8:00am__________________________  Instructions regarding medication:   ____ : Hold diabetes medication morning of procedure  __xx__:  Hold betablocker(s) night before procedure and morning of procedure. Do not take carvedilol, lasix or hydrocholorothiazide the night before or morning of your test.  PLEASE NOTIFY THE OFFICE AT LEAST 24 HOURS IN ADVANCE IF YOU ARE UNABLE TO KEEP YOUR APPOINTMENT.   [phone] AND  PLEASE NOTIFY NUCLEAR MEDICINE AT [facility] AT LEAST 24 HOURS IN ADVANCE IF YOU ARE UNABLE TO KEEP YOUR APPOINTMENT. [phone]  How to prepare for your Myoview test:  1. Do not eat or drink after midnight 2. No caffeine for 24 hours prior to test 3. No smoking 24 hours prior to test. 4. Your medication may be taken with water.  If your doctor stopped a medication because of this test, do not take that medication. 5. Ladies, please do not wear dresses.  Skirts or pants are appropriate. Please wear a short sleeve shirt. 6. No perfume, cologne or lotion. 7. Wear comfortable walking shoes. No heels!           Remote monitoring is used to monitor your Pacemaker of ICD from home. This monitoring reduces the number of office visits required to check your device to one time per year. It allows [place] to keep an eye on the functioning of your device to ensure it is working properly. You are scheduled for a device check from home on November 1. You may send your transmission at any time that day. If you have a wireless device, the transmission will be sent automatically. After your physician reviews your transmission, you will receive a postcard with your next transmission date.    Follow-Up: Your physician recommends that you schedule a follow-up appointment in: 2-3 weeks with Ian Faith, Ian Mills. Labs at this time.  Your physician wants you to follow-up in: one year with Ian Mills.  You will receive a reminder letter in the mail two months in advance. If you don't receive a letter, please call our office to schedule the follow-up appointment. Marland Kitchen  Any Other Special Instructions Will Be Listed Below (If Applicable).

## 2015-02-28 NOTE — Progress Notes (Signed)
Patient Care Team: No Pcp Per Patient as PCP - General (General Practice)   HPI  Ian Mills is a 59 y.o. male Seen in followup for ICD implanted May 2013 following presentation with ventricular tachycardia for which he takes amiodarone . We have not seen him in more than 2 years.  He has had problems with syncope. This was addressed by the discontinuation of hydrochlorothiazide he has not had recurrent problems.  He has had no intercurrent ventricular arrhythmias.  He has modest exertional chest discomfort. It is relieved by rest. He does not have peripheral edema. He has chronic stable shortness of breath.   He has a history of ischemic heart disease with prior stenting of his LAD PDA and OM last in 2008. He underwent catheterization in May 2013 without further PCI. The details of that evaluation are not immediately available. MRI scanning 2008 demonstrated a large anterolateral region of non-viability ejection fraction was 35%.  Echocardiogram 11/15 demonstrated EF of 20-25% with LAD  Surveillance blood work 11/15 was 1.15/3.9     Past Medical History  Diagnosis Date  . Ischemic cardiomyopathy     s/p mi  multiiple stents  . HLD (hyperlipidemia)   . HTN (hypertension)   . Ventricular tachycardia 11/2011    s/p Defib implant   . ICD  single,BSX     DOI 11/2011  . Coronary artery disease   . Syncope and collapse     Past Surgical History  Procedure Laterality Date  . Carotid stent      x3 mid LAD, one stent PDA, one stent OM3 in 2/08  . Cardiac defibrillator placement  May 2013  . Tumor removal      chest  . Appendectomy    . Cardiac catheterization  Nov 30 2011    [facility]  . Coronary angioplasty  2014    x 2 stents    Current Outpatient Prescriptions  Medication Sig Dispense Refill  . amiodarone (PACERONE) 200 MG tablet TAKE 1 TABLET BY MOUTH ONCE DAILY 5 DAYS A AWEEK 150 tablet 3  . aspirin 81 MG EC tablet Take 1 tablet (81 mg total) by mouth daily. 30 tablet 6   . atorvastatin (LIPITOR) 80 MG tablet TAKE 1 TABLET BY MOUTH EVERY DAY 30 tablet 6  . carvedilol (COREG) 3.125 MG tablet Take 1 tablet (3.125 mg total) by mouth 2 (two) times daily. 180 tablet 3  . clopidogrel (PLAVIX) 75 MG tablet TAKE 1 TABLET BY MOUTH EVERY DAY 30 tablet 3  . ezetimibe (ZETIA) 10 MG tablet Take 1 tablet (10 mg total) by mouth daily. 30 tablet 6  . furosemide (LASIX) 40 MG tablet TAKE 1 TABLET BY MOUTH ONCE DAILY 90 tablet 3  . hydrochlorothiazide (HYDRODIURIL) 25 MG tablet Take 1 tablet (25 mg total) by mouth daily as needed. 90 tablet 3  . KLOR-CON 10 10 MEQ tablet take 1 tablet by mouth once daily 90 tablet 3  . nitroGLYCERIN (NITROSTAT) 0.4 MG SL tablet Place 1 tablet (0.4 mg total) under the tongue every 5 (five) minutes as needed for chest pain. 25 tablet 3  . ramipril (ALTACE) 5 MG capsule TAKE ONE CAPSULE BY MOUTH EVERY DAY 60 capsule 3  . tadalafil (CIALIS) 10 MG tablet Take 1 tablet (10 mg total) by mouth daily as needed for erectile dysfunction (take 30 min prior to when needed.). 10 tablet 0  . zolpidem (AMBIEN) 10 MG tablet Take 1 tablet (10 mg total) by mouth at bedtime as  needed. 30 tablet 3   No current facility-administered medications for this visit.    Allergies  Allergen Reactions  . Nitroglycerin     Nauseas    Review of Systems negative except from HPI and PMH  Physical Exam BP 130/72 mmHg  Pulse 64  Ht 5\' 8"  (1.727 m)  Wt 198 lb 8 oz (90.039 kg)  BMI 30.19 kg/m2  SpO2 98% Well developed and nourished in no acute distress HENT normal Neck supple with JVP-flat Clear Regular rate and rhythm,+s4  no murmurs   Abd-soft with active BS No Clubbing cyanosis edema Skin-warm and dry A & Oriented  Grossly normal sensory and motor function Device pocket well healed; without hematoma or erythema.  There is no tethering   ECG demonstrates sinus rhythm at 64 Intervals 18/08/40 Prior EF MI Axis left -41  Assessment and  Plan  Ventricular  tachycardia  Ischemic cardiomyopathy  Congestive heart failure-chronic-systolic  Exertional chest discomfort  Hypertension  Implantable defibrillator-. [employer]  Cigarette abuse    His had no intercurrent sustained ventricular tachycardia. He continues to have short runs of nonsustained ventricular tachycardia, hence, I will continue him on his low-dose amiodarone. He will need surveillance laboratories which we will get in 2 weeks.  With his blood pressure and his cardiomyopathy, based on the [facility] TRIAL, we will transition him to [facility] started 24/26. We will have him come back in 2 weeks for blood work including amiodarone surveillance, i.e. seen at an TSH, and up titrate his Entresto if possible.  He is euvolemic. We will continue him on his current dose of diuretics.  I'm concerned about his chest discomfort. It seems to be progressive. We will undertake a Myoview scan.

## 2015-03-02 LAB — CUP PACEART INCLINIC DEVICE CHECK
Date Time Interrogation Session: 20160802040000
HIGH POWER IMPEDANCE MEASURED VALUE: 90 Ohm
Lead Channel Impedance Value: 608 Ohm
Lead Channel Pacing Threshold Amplitude: 0.8 V
Lead Channel Pacing Threshold Pulse Width: 0.5 ms
Lead Channel Sensing Intrinsic Amplitude: 24.4 mV
Lead Channel Setting Pacing Amplitude: 2.4 V
Lead Channel Setting Sensing Sensitivity: 0.6 mV
MDC IDC SET LEADCHNL RV PACING PULSEWIDTH: 0.5 ms
Pulse Gen Serial Number: 105232
Zone Setting Detection Interval: 300 ms
Zone Setting Detection Interval: 353 ms

## 2015-03-06 ENCOUNTER — Encounter: Payer: Self-pay | Admitting: Emergency Medicine

## 2015-03-06 ENCOUNTER — Emergency Department: Payer: 59

## 2015-03-06 ENCOUNTER — Telehealth: Payer: Self-pay | Admitting: *Deleted

## 2015-03-06 ENCOUNTER — Encounter
Admission: RE | Admit: 2015-03-06 | Discharge: 2015-03-06 | Disposition: A | Discharge status: Home / Self Care | Payer: 59 | Source: Ambulatory Visit | Attending: Internal Medicine | Admitting: Internal Medicine

## 2015-03-06 ENCOUNTER — Emergency Department
Admission: EM | Admit: 2015-03-06 | Discharge: 2015-03-06 | Discharge status: Against Medical Advice | Payer: 59 | Attending: Student | Admitting: Student

## 2015-03-06 ENCOUNTER — Other Ambulatory Visit: Payer: Self-pay

## 2015-03-06 DIAGNOSIS — Z7982 Long term (current) use of aspirin: Secondary | ICD-10-CM | POA: Diagnosis not present

## 2015-03-06 DIAGNOSIS — R079 Chest pain, unspecified: Secondary | ICD-10-CM | POA: Insufficient documentation

## 2015-03-06 DIAGNOSIS — R0602 Shortness of breath: Secondary | ICD-10-CM | POA: Insufficient documentation

## 2015-03-06 DIAGNOSIS — Z7901 Long term (current) use of anticoagulants: Secondary | ICD-10-CM | POA: Diagnosis not present

## 2015-03-06 DIAGNOSIS — R5381 Other malaise: Secondary | ICD-10-CM | POA: Diagnosis not present

## 2015-03-06 DIAGNOSIS — Z87891 Personal history of nicotine dependence: Secondary | ICD-10-CM | POA: Diagnosis not present

## 2015-03-06 DIAGNOSIS — I10 Essential (primary) hypertension: Secondary | ICD-10-CM | POA: Insufficient documentation

## 2015-03-06 LAB — PROTIME-INR
INR: 0.97
PROTHROMBIN TIME: 13.1 s (ref 11.4–15.0)

## 2015-03-06 LAB — CBC
HEMATOCRIT: 41.3 % (ref 40.0–52.0)
Hemoglobin: 13.8 g/dL (ref 13.0–18.0)
MCH: 29.3 pg (ref 26.0–34.0)
MCHC: 33.3 g/dL (ref 32.0–36.0)
MCV: 88 fL (ref 80.0–100.0)
Platelets: 193 10*3/uL (ref 150–440)
RBC: 4.69 MIL/uL (ref 4.40–5.90)
RDW: 16.6 % — ABNORMAL HIGH (ref 11.5–14.5)
WBC: 7.4 10*3/uL (ref 3.8–10.6)

## 2015-03-06 LAB — NM MYOCAR MULTI W/SPECT W/WALL MOTION / EF
CHL CUP NUCLEAR SRS: 33
CSEPEW: 6.6 METS
CSEPHR: 71 %
LV dias vol: 398 mL
LV sys vol: 263 mL
NUC STRESS TID: 0.93
Peak HR: 114 {beats}/min
Rest HR: 63 {beats}/min
SDS: 0
SSS: 26

## 2015-03-06 LAB — BASIC METABOLIC PANEL
Anion gap: 12 (ref 5–15)
BUN: 17 mg/dL (ref 6–20)
CO2: 24 mmol/L (ref 22–32)
Calcium: 9 mg/dL (ref 8.9–10.3)
Chloride: 97 mmol/L — ABNORMAL LOW (ref 101–111)
Creatinine, Ser: 1.13 mg/dL (ref 0.61–1.24)
GFR calc non Af Amer: >60 mL/min (ref >60)
GLUCOSE: 145 mg/dL — AB (ref 65–99)
Potassium: 3.3 mmol/L — ABNORMAL LOW (ref 3.5–5.1)
Sodium: 133 mmol/L — ABNORMAL LOW (ref 135–145)

## 2015-03-06 LAB — TROPONIN I: Troponin I: <0.03 ng/mL (ref <0.031)

## 2015-03-06 MED ORDER — TECHNETIUM TC 99M SESTAMIBI GENERIC - CARDIOLITE
30.0000 | Freq: Once | INTRAVENOUS | Status: AC | PRN
  Administered 2015-03-06: 31.36 via INTRAVENOUS

## 2015-03-06 MED ORDER — ASPIRIN 81 MG PO CHEW
324.0000 mg | CHEWABLE_TABLET | Freq: Once | ORAL | Status: AC
  Administered 2015-03-06: 324 mg via ORAL
  Filled 2015-03-06: qty 4

## 2015-03-06 MED ORDER — REGADENOSON 0.4 MG/5ML IV SOLN
0.4000 mg | Freq: Once | INTRAVENOUS | Status: AC
  Administered 2015-03-06: 0.4 mg via INTRAVENOUS

## 2015-03-06 MED ORDER — TECHNETIUM TC 99M SESTAMIBI GENERIC - CARDIOLITE
13.0000 | Freq: Once | INTRAVENOUS | Status: AC | PRN
  Administered 2015-03-06: 14.131 via INTRAVENOUS

## 2015-03-06 NOTE — ED Notes (Signed)
MD at bedside. 

## 2015-03-06 NOTE — ED Provider Notes (Signed)
[facility] Emergency Department Provider Note  ____________________________________________  Time seen: Approximately 3:10 PM  I have reviewed the triage vital signs and the nursing notes.   HISTORY  Chief Complaint Chest Pain and Dizziness    HPI Ian Mills is a 59 y.o. male with history of coronary artery disease status post multiple stents, ischemic cardiomyopathy with EF of 20-25%, hypertension, hyperlipidemia, history of ventricular tachycardia status post AICD presents for evaluation of sudden onset intermittent chest pain, shortness of breath, lightheadedness. The patient was being seen here earlier today for a treadmill stress test. He reports that he developed chest pain, difficulty breathing and the treadmill portion of the stress test was terminated; it was transformed into a nuclear medicine stress test. He is not sure of the result reports from today's test. He reports that when he will walked to his car to drive home he felt unsafe and unable to drive because he was very short of breath and lightheaded. He decided he should come to the emergency department to be evaluated. Currently he has no chest pain. Symptoms today seems to have been worsened by exertion and improved with rest.   Past Medical History  Diagnosis Date  . Ischemic cardiomyopathy     s/p mi  multiiple stents  . HLD (hyperlipidemia)   . HTN (hypertension)   . Ventricular tachycardia 11/2011    s/p Defib implant   . ICD  single,BSX     DOI 11/2011  . Coronary artery disease   . Syncope and collapse     Patient Active Problem List   Diagnosis Date Noted  . Smoker 09/29/2014  . Syncope 06/01/2014  . Insomnia 06/01/2014  . Single implantable cardioverter-defibrillator-BSx   . Ischemic cardiomyopathy   . Ventricular tachycardia 01/23/2012  . SYNCOPE AND COLLAPSE 09/26/2010  . Hyperlipidemia 02/23/2010  . Hypotension 02/23/2010  . CAD (coronary artery disease) 02/23/2010     Past Surgical History  Procedure Laterality Date  . Cardiac defibrillator placement  May 2013  . Tumor removal      chest  . Appendectomy    . Cardiac catheterization  Nov 30 2011    [facility]  . Coronary angioplasty  2014    x 2 stents  . Coronary stent placement      x7    Current Outpatient Rx  Name  Route  Sig  Dispense  Refill  . amiodarone (PACERONE) 200 MG tablet      TAKE 1 TABLET BY MOUTH ONCE DAILY 5 DAYS A AWEEK   150 tablet   3   . aspirin 81 MG EC tablet   Oral   Take 1 tablet (81 mg total) by mouth daily.   30 tablet   6   . atorvastatin (LIPITOR) 80 MG tablet      TAKE 1 TABLET BY MOUTH EVERY DAY   30 tablet   6   . carvedilol (COREG) 3.125 MG tablet   Oral   Take 1 tablet (3.125 mg total) by mouth 2 (two) times daily.   180 tablet   3   . clopidogrel (PLAVIX) 75 MG tablet      TAKE 1 TABLET BY MOUTH EVERY DAY   30 tablet   3   . ezetimibe (ZETIA) 10 MG tablet   Oral   Take 1 tablet (10 mg total) by mouth daily.   30 tablet   6   . furosemide (LASIX) 40 MG tablet      TAKE  1 TABLET BY MOUTH ONCE DAILY   90 tablet   3   . hydrochlorothiazide (HYDRODIURIL) 25 MG tablet   Oral   Take 1 tablet (25 mg total) by mouth daily as needed.   90 tablet   3   . KLOR-CON 10 10 MEQ tablet      take 1 tablet by mouth once daily   90 tablet   3   . nitroGLYCERIN (NITROSTAT) 0.4 MG SL tablet   Sublingual   Place 1 tablet (0.4 mg total) under the tongue every 5 (five) minutes as needed for chest pain.   25 tablet   3   . sacubitril-valsartan (ENTRESTO) 24-26 MG   Oral   Take 1 tablet by mouth 2 (two) times daily.   60 tablet   5   . tadalafil (CIALIS) 10 MG tablet   Oral   Take 1 tablet (10 mg total) by mouth daily as needed for erectile dysfunction (take 30 min prior to when needed.).   10 tablet   0   . zolpidem (AMBIEN) 10 MG tablet   Oral   Take 1 tablet (10 mg total) by mouth at bedtime as needed.   30 tablet   3      Allergies Nitroglycerin  Family History  Problem Relation Age of Onset  . Family history unknown: Yes    Social History History  Substance Use Topics  . Smoking status: Former Smoker -- 1.00 packs/day for 7 years    Types: Cigarettes    Quit date: 11/27/2011  . Smokeless tobacco: Not on file     Comment: smoked 1p q 2 days x 6 years   . Alcohol Use: 7.2 oz/week    12 Cans of beer per week     Comment: occassionally    Review of Systems Constitutional: No fever/chills Eyes: No visual changes. ENT: No sore throat. Cardiovascular: + chest pain. Respiratory: + shortness of breath. Gastrointestinal: No abdominal pain.  No nausea, no vomiting.  No diarrhea.  No constipation. Genitourinary: Negative for dysuria. Musculoskeletal: Negative for back pain. Skin: Negative for rash. Neurological: Negative for headaches, focal weakness or numbness.  10-point ROS otherwise negative.  ____________________________________________   PHYSICAL EXAM:  Filed Vitals:   03/06/15 1447 03/06/15 1500 03/06/15 1611  BP: 138/81 125/72   Pulse:  71   Temp:   97.9 F (36.6 C)  TempSrc:   Oral  Resp: 21 23   SpO2:  99%      Constitutional: Alert and oriented. Well appearing and in no acute distress. Eyes: Conjunctivae are normal. PERRL. EOMI. Head: Atraumatic. Nose: No congestion/rhinnorhea. Mouth/Throat: Mucous membranes are moist.  Oropharynx non-erythematous. Neck: No stridor.  Cardiovascular: Normal rate, regular rhythm. Grossly normal heart sounds.  Good peripheral circulation. Respiratory: Normal respiratory effort.  No retractions. Lungs CTAB. Gastrointestinal: Soft and nontender. No distention. No abdominal bruits. No CVA tenderness. Genitourinary: deferred Musculoskeletal: No lower extremity tenderness nor edema.  No joint effusions. Neurologic:  Normal speech and language. No gross focal neurologic deficits are appreciated. No gait instability. Skin:  Skin is warm, dry  and intact. No rash noted. Psychiatric: Mood and affect are normal. Speech and behavior are normal.  ____________________________________________   LABS (all labs ordered are listed, but only abnormal results are displayed)  Labs Reviewed  BASIC METABOLIC PANEL - Abnormal; Notable for the following:    Sodium 133 (*)    Potassium 3.3 (*)    Chloride 97 (*)    Glucose,  Bld 145 (*)    All other components within normal limits  CBC - Abnormal; Notable for the following:    RDW 16.6 (*)    All other components within normal limits  TROPONIN I  PROTIME-INR   ____________________________________________  EKG  ED ECG REPORT I, Joanne Gavel, the attending physician, personally viewed and interpreted this ECG.   Date: 03/06/2015  EKG Time: 13:08  Rate: 83  Rhythm: normal sinus rhythm  Axis: left  Intervals:none  ST&T Change: No acute ST segment elevation. Q waves in V1, V2, V3, V4, V5. T-wave inversions in aVL.  ____________________________________________  RADIOLOGY  CXR FINDINGS: Left subclavian approach intracardiac defibrillator. The lead projects over the right ventricle in unchanged position. Cardiac and mediastinal contours are within normal limits. No pulmonary edema, focal consolidation, pleural effusion or pneumothorax. Patient is status post median sternotomy. Atherosclerotic calcification again noted in the transverse aorta. No acute osseous abnormality.  IMPRESSION: No active cardiopulmonary disease.  ____________________________________________   PROCEDURES  Procedure(s) performed: None  Critical Care performed: No  ____________________________________________   INITIAL IMPRESSION / ASSESSMENT AND PLAN / ED COURSE  Pertinent labs & imaging results that were available during my care of the patient were reviewed by me and considered in my medical decision making (see chart for details).  MARIUSZ JUBB is a 59 y.o. male with history of  coronary artery disease status post multiple stents, ischemic cardiomyopathy with EF of 20-25%, hypertension, hyperlipidemia, history of ventricular tachycardia status post AICD presents for evaluation of sudden onset intermittent chest pain, shortness of breath, lightheadedness which began during a treadmill stress test today. On exam, he is generally well-appearing and in no acute distress. Vital signs are stable though he has been very mildly tachypneic Intermittently. My concern is for unstable angina in this patient with considerable heart history, symptoms that worsen with exertion and improve with rest. First Troponin negative. EKG with evidence of old ischemia but no STEMI. I recommended admission however he has refused stating he instead would like to leave [place]. I discussed that I'm concerned that he could be causing damage to his heart, could be having a heart attack, could have worsening heart failure, could die prematurely as a result of his current medical condition. He voices understanding and says "I know that I could be having a bad heart attack and I could die". He has capacity to make this decision. We discussed that he would return immediately for worsening symptoms and he will call his doctor in the morning. Aspirin ordered. He  signed AMA paperwork. ____________________________________________   FINAL CLINICAL IMPRESSION(S) / ED DIAGNOSES  Final diagnoses:  Chest pain, unspecified chest pain type  SOB (shortness of breath) on exertion      Joanne Gavel, MD 03/06/15 2015

## 2015-03-06 NOTE — Telephone Encounter (Signed)
We currently have not received zetia samples. We have requested them but have not received them.  Unable to leave message due to pt VM being full to notify pt.

## 2015-03-06 NOTE — ED Notes (Signed)
Pt reports having chest tightness starting yesterday.  Pt went to have a routine treadmill stress test today, which he was unable to finish.  Pt then started to drive home, but felt "unsafe" due to extreme dizziness.  Pt to ED triage and remains dizzy.  Pt with strong cardiac history of CAD with multiple stents and VT with AICD placement.

## 2015-03-06 NOTE — Telephone Encounter (Signed)
[facility] asking about a prior auth. Please call they sent it few weeks ago.  Please advise.

## 2015-03-06 NOTE — Telephone Encounter (Signed)
Pt has been denied for Entresto form has been refaxed per Alysia Penna, RN.

## 2015-03-07 NOTE — Telephone Encounter (Signed)
Please see note below, Do you know if there is any update on PA?

## 2015-03-08 ENCOUNTER — Telehealth: Payer: Self-pay

## 2015-03-08 NOTE — Telephone Encounter (Signed)
Has been submitted for approval. Awaiting response

## 2015-03-08 NOTE — Telephone Encounter (Signed)
Medication Samples have been provided to the patient.  Drug name: Delene Loll 86/26  Qty: 28  LOT: [id number]  Exp.Date: 4/17  The patient has been instructed regarding the correct time, dose, and frequency of taking this medication  Georgiana Shore 2:01 PM 03/08/2015

## 2015-03-11 ENCOUNTER — Other Ambulatory Visit: Payer: Self-pay | Admitting: Cardiovascular Disease

## 2015-03-14 ENCOUNTER — Encounter: Payer: Medicare Other | Admitting: Pharmacist

## 2015-03-17 ENCOUNTER — Encounter (INDEPENDENT_AMBULATORY_CARE_PROVIDER_SITE_OTHER): Payer: Self-pay

## 2015-03-21 ENCOUNTER — Encounter: Payer: Self-pay | Admitting: Internal Medicine

## 2015-03-21 ENCOUNTER — Telehealth: Payer: Self-pay

## 2015-03-21 NOTE — Telephone Encounter (Signed)
S/w [facility] who states Entresto prior auth received and pt has $35 co-pay.   Notified pt prescription ready for pick up once he is out of samples. Pt verbalized understanding

## 2015-03-28 ENCOUNTER — Encounter: Payer: Self-pay | Admitting: Physician Assistant

## 2015-03-29 ENCOUNTER — Ambulatory Visit (INDEPENDENT_AMBULATORY_CARE_PROVIDER_SITE_OTHER): Payer: 59 | Admitting: Physician Assistant

## 2015-03-29 ENCOUNTER — Encounter: Payer: Self-pay | Admitting: Physician Assistant

## 2015-03-29 ENCOUNTER — Other Ambulatory Visit: Payer: Medicare Other

## 2015-03-29 VITALS — BP 120/78 | HR 68 | Ht 68.0 in | Wt 196.5 lb

## 2015-03-29 DIAGNOSIS — I2510 Atherosclerotic heart disease of native coronary artery without angina pectoris: Secondary | ICD-10-CM

## 2015-03-29 DIAGNOSIS — I472 Ventricular tachycardia, unspecified: Secondary | ICD-10-CM

## 2015-03-29 DIAGNOSIS — Z720 Tobacco use: Secondary | ICD-10-CM

## 2015-03-29 DIAGNOSIS — I255 Ischemic cardiomyopathy: Secondary | ICD-10-CM | POA: Diagnosis not present

## 2015-03-29 DIAGNOSIS — Z1329 Encounter for screening for other suspected endocrine disorder: Secondary | ICD-10-CM | POA: Diagnosis not present

## 2015-03-29 DIAGNOSIS — Z95810 Presence of automatic (implantable) cardiac defibrillator: Secondary | ICD-10-CM | POA: Diagnosis not present

## 2015-03-29 DIAGNOSIS — E785 Hyperlipidemia, unspecified: Secondary | ICD-10-CM

## 2015-03-29 DIAGNOSIS — I959 Hypotension, unspecified: Secondary | ICD-10-CM

## 2015-03-29 DIAGNOSIS — R42 Dizziness and giddiness: Secondary | ICD-10-CM

## 2015-03-29 DIAGNOSIS — F17200 Nicotine dependence, unspecified, uncomplicated: Secondary | ICD-10-CM

## 2015-03-29 MED ORDER — SACUBITRIL-VALSARTAN 49-51 MG PO TABS: 1.0000 | ORAL_TABLET | Freq: Two times a day (BID) | ORAL | Status: DC

## 2015-03-29 NOTE — Patient Instructions (Signed)
Medication Instructions:  Your physician has recommended you make the following change in your medication:  STOP taking entresto 24/26 START taking entresto 49/51 twice per day   Labwork: Your physician recommends that you have labs today: BMET. CMET and TSH already ordered   Testing/Procedures: none  Follow-Up: Your physician recommends that you schedule a follow-up appointment in: three months with Christell Faith, PA-C   Any Other Special Instructions Will Be Listed Below (If Applicable).

## 2015-03-29 NOTE — Progress Notes (Signed)
Cardiology Office Note:  Date of Encounter: 03/29/2015  ID: Ian Mills, DOB January 19, 1956, MRN [id number]  PCP:  No PCP Per Patient Primary Cardiologist:  Dr. Rockey Situ, MD Primary Electrophysiologist: Dr. Caryl Comes, MD  Chief Complaint  Patient presents with  . other    Follow up from stress test. Was at [name] ER with HTN and left AMA. Meds reviewed by the patient verbally.     HPI:  59 year old male with history of CAD s/p multiple stenting, ischemic cardiomyopathy with EF 25-30% by echo on 05/2014, history of VT arrest s/p [employer] ICD 12/01/2011, history of prior syncope and collapse, ongoing tobacco abuse, HTN, and HLD who presents from recent [facility] ED visit for chest pain post nuclear stress test.    He has known CAD with 3 stents to the LAD, 1 to the PDA, and 1 to the OM3. In 11/2011 he had an episode of VT requiring resusition. He was transferred to [facility] and had a cardiac cath with no PCI that showed chronically occluded LAD and moderately diseased RCA. He had a defibrillator placed on 12/01/2011 given the above. Prior cardiac MRI in 08/2006 that showed anterior and anterolateral region was nonviable. EF at that time was 35%. He was admitted to the hospital in 06/2013 for syncope, chest pain, and malaise. Cardiac cath at on 06/29/2013 showed chronically mid LAD and patent stent in LCx, D1 20%, proximal LCx 30%, mid LCx 20%, prox RCA 70% (FFR 0.74) s/p PCI/DES, mid RCA 70% s/p PCI/DES, RPDA 50%, EF estimated at 20% with anterolateral akinesis and apical akinesis. There was moderately elevated LVEDP. It was recommended he undertake aggressive medical therapy and cardiac rehab, maximaze heart failure medications and consider adding aldosterone blocking medication. HCTZ was stopped. He has further had episode of NSVT in 10/2012 that did not correlate with any specific symptoms per prior notes. Echo in 05/2014 showed an EF of 25-30%, akinesis of mid-distal anterior, apical and distal inferior  walls, moderate LVH, diastolic dysfunction, mild to moderate increased LV internal cavity size, moderately dilated left atrium, mildly dilated right atrium, mild MR, and mild aortic sclerosis without stenosis. Last device interrogation on 8/2 showing 40 episodes of NSVT, none requiring therapy.   He was having increased exertional chest discomfort at that time that was relieved by rest. He underwent nuclear stress testing on 03/06/2015 that showed hypertensive response to exercise with a large defect of severe severity present in the mid anterior, mid anteroseptal, apical anterior, apical inferior and apex location with no significant reversibility, findings were consistent with prior MI. EF 27%. This was a high risk study. Patient presented to [facility] ED following the stress test with complaints of sudden onset of documented intermittent chest pain, SOB, and lightheadedness. Troponin was negative x 1. CXR showed no active cardiopulmonary disease. He was found to be hypokalemic at 3.3. ECG showed NSR, 83 bpm, left axis deviation, cannot rule put anterolateral infarct. He ended up leaving AMA from the ED.   Upon interviewing the patient at his [facility] ED follow up he reports he never had chest pain. He was feeling dizzy post stress test and that is why he went to the ED. He reports he has actually been feeling quite well and denies any recent chest pain. I asked him why was the stress test done then and he states "just to update it." He continues to be active without symptoms. He does not have any concerns today.       Past  Medical History  Diagnosis Date  . Ischemic cardiomyopathy     a. echo: 05/2014: EF 25-30%, AK of mid-dist ant, apical and dist inf walls, mod LVH, DD, mod dilated LA, mildly dilated RA, mild MR, mild Ao scl w/o stenosis    . HLD (hyperlipidemia)   . HTN (hypertension)   . Ventricular tachycardia 11/2011    a. s/p AICD implant   . ICD  single,BSX     a. DOI 11/2011; b. S/N# [id number]  .  Coronary artery disease     a. 3 stents to the LAD, 1 to the PDA, and 1 to the OM3; b. cath 06/2013: chronically occluded LAD, patent stent LAD, D1 20%, pLCx 30%, mLCx 20%, pRCA 70% (FFR 0.74) s/p PCI/DES, mRCA 70% s/p PCI/DES, RPDA 50%, EF 20% w/ AK of anterolateral & apical walls, moderately elevated LVEDP  . Syncope and collapse   :  Past Surgical History  Procedure Laterality Date  . Cardiac defibrillator placement  May 2013  . Tumor removal      chest  . Appendectomy    . Cardiac catheterization  Nov 30 2011    [facility]  . Coronary angioplasty  2014    x 2 stents  . Coronary stent placement      x7  :  Social History:  The patient  reports that he quit smoking about 3 years ago. His smoking use included Cigarettes. He has a 7 pack-year smoking history. He does not have any smokeless tobacco history on file. He reports that he drinks about 7.2 oz of alcohol per week. He reports that he does not use illicit drugs.   Family History  Problem Relation Age of Onset  . Family history unknown: Yes     Allergies:  Allergies  Allergen Reactions  . Nitroglycerin     Nauseas     Home Medications:  Current Outpatient Prescriptions  Medication Sig Dispense Refill  . amiodarone (PACERONE) 200 MG tablet TAKE 1 TABLET BY MOUTH ONCE DAILY 5 DAYS A AWEEK 150 tablet 3  . aspirin 81 MG EC tablet Take 1 tablet (81 mg total) by mouth daily. 30 tablet 6  . atorvastatin (LIPITOR) 80 MG tablet TAKE 1 TABLET BY MOUTH EVERY DAY 30 tablet 6  . carvedilol (COREG) 3.125 MG tablet TAKE 1 TABLET BY MOUTH TWICE DAILY 180 tablet 3  . clopidogrel (PLAVIX) 75 MG tablet TAKE 1 TABLET BY MOUTH EVERY DAY 30 tablet 3  . ezetimibe (ZETIA) 10 MG tablet Take 1 tablet (10 mg total) by mouth daily. 30 tablet 6  . furosemide (LASIX) 40 MG tablet TAKE 1 TABLET BY MOUTH ONCE DAILY 90 tablet 3  . hydrochlorothiazide (HYDRODIURIL) 25 MG tablet Take 1 tablet (25 mg total) by mouth daily as needed. 90 tablet 3  .  KLOR-CON 10 10 MEQ tablet take 1 tablet by mouth once daily 90 tablet 3  . nitroGLYCERIN (NITROSTAT) 0.4 MG SL tablet Place 1 tablet (0.4 mg total) under the tongue every 5 (five) minutes as needed for chest pain. 25 tablet 3  . sacubitril-valsartan (ENTRESTO) 24-26 MG Take 1 tablet by mouth 2 (two) times daily. 60 tablet 5  . tadalafil (CIALIS) 10 MG tablet Take 1 tablet (10 mg total) by mouth daily as needed for erectile dysfunction (take 30 min prior to when needed.). 10 tablet 0  . zolpidem (AMBIEN) 10 MG tablet Take 1 tablet (10 mg total) by mouth at bedtime as needed. 30 tablet 3   No  current facility-administered medications for this visit.     Review of Systems:  Review of Systems  Constitutional: Positive for weight loss. Negative for fever, chills, malaise/fatigue and diaphoresis.       Planned weight loss through diet and exercise   HENT: Negative for congestion.   Eyes: Negative for blurred vision, discharge and redness.  Respiratory: Negative for cough, hemoptysis, sputum production, shortness of breath and wheezing.   Cardiovascular: Negative for chest pain, palpitations, orthopnea, claudication, leg swelling and PND.       Never with chest pain, per patient report   Gastrointestinal: Negative for heartburn, nausea, vomiting and abdominal pain.  Musculoskeletal: Negative for myalgias and falls.  Skin: Negative for rash.  Neurological: Positive for dizziness. Negative for sensory change, speech change, focal weakness, seizures, loss of consciousness and weakness.       Dizziness post stress test, none since   Endo/Heme/Allergies: Does not bruise/bleed easily.  Psychiatric/Behavioral: The patient is not nervous/anxious.   All other systems reviewed and are negative.    Physical Exam:  Blood pressure 120/78, pulse 68, height 5\' 8"  (1.727 m), weight 196 lb 8 oz (89.132 kg). BMI: Body mass index is 29.88 kg/(m^2). General: Pleasant, NAD. Psych: Normal affect. Responds to  questions with normal affect.  Neuro: Alert and oriented X 3. Moves all extremities spontaneously. HEENT: Normocephalic, atraumatic. EOM intact. Sclera anicteric.  Neck: Trachea midline. Supple without bruits or JVD. Lungs:  Respirations regular and unlabored. CTA bilaterally without wheezing, crackles, or rhonchi.  Heart: RRR, normal s3, s4. No murmurs, rubs, or gallops.  Abdomen: Soft, non-tender, non-distended, BS + x 4.  Extremities: No clubbing, cyanosis or edema. DP/PT/Radials 2+ and equal bilaterally.   Accessory Clinical Findings:  EKG: NSR, 68 bpm, lateral TWI, early repolarization anterior leads, cannot rule out anterior infarct (not new)  Recent Labs: 05/29/2014: SGPT (ALT) 24 03/06/2015: BUN 17; Creatinine, Ser 1.13; Hemoglobin 13.8; Platelets 193; Potassium 3.3*; Sodium 133*  No results found for requested labs within last 365 days.  CrCl cannot be calculated (Patient has no serum creatinine result on file.).  Weights: Wt Readings from Last 3 Encounters:  03/29/15 196 lb 8 oz (89.132 kg)  02/28/15 198 lb 8 oz (90.039 kg)  09/29/14 200 lb 8 oz (90.946 kg)    Other studies Reviewed: Additional studies/ records that were reviewed today include: OV notes and [facility] ED note.  Assessment & Plan:  1. Chest pain/dizziness:  -Never with any chest pain per his report -I offered the patient cardiac cath based on his documented chest pain in prior notes and episode of dizziness post stress test. He declined this at this time -Should his symptoms return he states he will then proceed with cardiac cath -Asymptomatic currently   2. CAD s/p multiple stenting as above:  -Asymptomatic as above -Continue aspirin 81 mg Plavix 75 mg daily -Continue Coreg 3.125 mg bid, and SL NTG 0.4 mg prn -Continue Lipitor 80 mg   3. Ischemic cardiomyopathy:  -Increase Entresto to 49/51 mg bid, has been tolerating this medication well with no side effects for 1 month now  -Continue Coreg 3.125 mg  bid -Continue Lasix 40 mg daily and KCl 10 mEq daily -Could look to add spironolactone in the future   4. History of VT arrest s/p [employer] ICD:  -Followed by EP -On amiodarone 200 mg daily 5 days per week per EP  5. Ongoing tobacco abuse:  -Cessation is advised  6. HTN:  -Controlled -Continue current  medications as above and HCTZ 25 mg prn   7. HLD: -Continue Lipitor as above  -Continue Zetia 10 mg daily    Dispo: -Follow up 3 months  Current medicines are reviewed at length with the patient today.  The patient did not have any concerns regarding medicines.   Christell Faith, PA-C [place] [address], [place] [phone] [facility] 03/29/2015, 2:30 PM

## 2015-03-30 DIAGNOSIS — R42 Dizziness and giddiness: Secondary | ICD-10-CM | POA: Insufficient documentation

## 2015-03-30 HISTORY — DX: Dizziness and giddiness: R42

## 2015-03-30 LAB — BASIC METABOLIC PANEL
BUN / CREAT RATIO: 12 (ref 10–22)
BUN: 15 mg/dL (ref 8–27)
CHLORIDE: 104 mmol/L (ref 97–108)
CO2: 23 mmol/L (ref 18–29)
Calcium: 9 mg/dL (ref 8.6–10.2)
Creatinine, Ser: 1.27 mg/dL (ref 0.76–1.27)
GFR calc Af Amer: 70 mL/min/{1.73_m2} (ref >59)
GFR calc non Af Amer: 61 mL/min/{1.73_m2} (ref >59)
Glucose: 98 mg/dL (ref 65–99)
POTASSIUM: 4.4 mmol/L (ref 3.5–5.2)
Sodium: 143 mmol/L (ref 134–144)

## 2015-03-30 LAB — TSH: TSH: 1.28 u[IU]/mL (ref 0.450–4.500)

## 2015-03-31 ENCOUNTER — Telehealth: Payer: Self-pay | Admitting: *Deleted

## 2015-03-31 NOTE — Telephone Encounter (Signed)
Attempted to call pt to udpate him his ICD cell adapter request was denied. Mailbox unavailable. Pt was previously issued a cell adapter by [employer] but has lost it.    Pt needs device checked every three months. Recall created for [facility] device clinic.

## 2015-05-18 ENCOUNTER — Other Ambulatory Visit: Payer: Self-pay | Admitting: Cardiovascular Disease

## 2015-05-23 ENCOUNTER — Ambulatory Visit: Payer: 59 | Admitting: Family Medicine

## 2015-05-23 ENCOUNTER — Encounter: Payer: Self-pay | Admitting: General Practice

## 2015-05-23 ENCOUNTER — Telehealth: Payer: Self-pay | Admitting: Family Medicine

## 2015-05-23 DIAGNOSIS — Z0289 Encounter for other administrative examinations: Secondary | ICD-10-CM

## 2015-05-23 NOTE — Telephone Encounter (Signed)
If he calls back to to reschedule and has a good reason to have missed the appointment today, then reschedule him.  Otherwise, take my name of the chart as I'm not his PCP and I haven't seen him.  Thanks.

## 2015-05-23 NOTE — Telephone Encounter (Signed)
Pt did no show to his NP appt today. Would you like to have pt rescheduled with you?

## 2015-05-24 ENCOUNTER — Other Ambulatory Visit: Payer: Self-pay | Admitting: Cardiovascular Disease

## 2015-06-06 ENCOUNTER — Other Ambulatory Visit: Payer: Self-pay | Admitting: Cardiovascular Disease

## 2015-06-14 ENCOUNTER — Telehealth: Payer: Self-pay | Admitting: *Deleted

## 2015-06-14 NOTE — Telephone Encounter (Signed)
Patient calling the office for samples of medication:   1.  What medication and dosage are you requesting samples for? Cialis, Zetia, baby aspirin   2.  Are you currently out of this medication? He is out, only has one zetia for tonight

## 2015-06-14 NOTE — Telephone Encounter (Signed)
Try to leave a message on cell phone but voice mail is full and unable to leave a message. We have samples available of cialis and baby aspirin only. No samples available of zetia anymore.

## 2015-06-17 DIAGNOSIS — N390 Urinary tract infection, site not specified: Secondary | ICD-10-CM | POA: Diagnosis not present

## 2015-06-17 DIAGNOSIS — N320 Bladder-neck obstruction: Secondary | ICD-10-CM | POA: Diagnosis not present

## 2015-06-19 DIAGNOSIS — R339 Retention of urine, unspecified: Secondary | ICD-10-CM | POA: Diagnosis not present

## 2015-06-19 DIAGNOSIS — N3000 Acute cystitis without hematuria: Secondary | ICD-10-CM | POA: Diagnosis not present

## 2015-06-19 DIAGNOSIS — R351 Nocturia: Secondary | ICD-10-CM | POA: Diagnosis not present

## 2015-06-19 DIAGNOSIS — R3912 Poor urinary stream: Secondary | ICD-10-CM | POA: Diagnosis not present

## 2015-06-19 DIAGNOSIS — N401 Enlarged prostate with lower urinary tract symptoms: Secondary | ICD-10-CM | POA: Diagnosis not present

## 2015-06-19 DIAGNOSIS — N3090 Cystitis, unspecified without hematuria: Secondary | ICD-10-CM | POA: Diagnosis not present

## 2015-06-20 ENCOUNTER — Telehealth: Payer: Self-pay

## 2015-06-20 NOTE — Telephone Encounter (Signed)
Need cardiac clearance for upcoming prostate surgery on  11/30 . Fax # [phone]

## 2015-06-20 NOTE — Telephone Encounter (Signed)
Reports that operating doctor spoke w/ Dr. Rockey Situ yesterday, but they need written clearance to proceed.

## 2015-06-23 NOTE — Telephone Encounter (Signed)
Can we write letter to urology office, Acceptable risk, No further testing needed

## 2015-06-26 NOTE — Telephone Encounter (Signed)
Routing to fax # provided, as I do not have Dr.'s name or phone #.

## 2015-06-27 NOTE — Telephone Encounter (Signed)
Letter typed and routed to Dr. Comer Locket @ [facility] [phone].

## 2015-06-28 DIAGNOSIS — N411 Chronic prostatitis: Secondary | ICD-10-CM | POA: Diagnosis not present

## 2015-06-28 DIAGNOSIS — I10 Essential (primary) hypertension: Secondary | ICD-10-CM | POA: Diagnosis not present

## 2015-06-28 DIAGNOSIS — R338 Other retention of urine: Secondary | ICD-10-CM | POA: Diagnosis not present

## 2015-06-28 DIAGNOSIS — Z951 Presence of aortocoronary bypass graft: Secondary | ICD-10-CM | POA: Diagnosis not present

## 2015-06-28 DIAGNOSIS — I509 Heart failure, unspecified: Secondary | ICD-10-CM | POA: Diagnosis not present

## 2015-06-28 DIAGNOSIS — N401 Enlarged prostate with lower urinary tract symptoms: Secondary | ICD-10-CM | POA: Diagnosis not present

## 2015-06-28 DIAGNOSIS — Z79899 Other long term (current) drug therapy: Secondary | ICD-10-CM | POA: Diagnosis not present

## 2015-06-28 DIAGNOSIS — Z95810 Presence of automatic (implantable) cardiac defibrillator: Secondary | ICD-10-CM | POA: Diagnosis not present

## 2015-06-28 DIAGNOSIS — R351 Nocturia: Secondary | ICD-10-CM | POA: Diagnosis not present

## 2015-06-28 DIAGNOSIS — Z23 Encounter for immunization: Secondary | ICD-10-CM | POA: Diagnosis not present

## 2015-06-28 DIAGNOSIS — Z7982 Long term (current) use of aspirin: Secondary | ICD-10-CM | POA: Diagnosis not present

## 2015-06-28 DIAGNOSIS — I2510 Atherosclerotic heart disease of native coronary artery without angina pectoris: Secondary | ICD-10-CM | POA: Diagnosis not present

## 2015-06-28 DIAGNOSIS — R3912 Poor urinary stream: Secondary | ICD-10-CM | POA: Diagnosis not present

## 2015-06-28 DIAGNOSIS — F17200 Nicotine dependence, unspecified, uncomplicated: Secondary | ICD-10-CM | POA: Diagnosis not present

## 2015-06-29 ENCOUNTER — Ambulatory Visit: Payer: 59 | Admitting: Cardiovascular Disease

## 2015-06-29 DIAGNOSIS — R338 Other retention of urine: Secondary | ICD-10-CM | POA: Diagnosis not present

## 2015-06-29 DIAGNOSIS — N401 Enlarged prostate with lower urinary tract symptoms: Secondary | ICD-10-CM | POA: Diagnosis not present

## 2015-06-29 DIAGNOSIS — I509 Heart failure, unspecified: Secondary | ICD-10-CM | POA: Diagnosis not present

## 2015-06-29 DIAGNOSIS — Z23 Encounter for immunization: Secondary | ICD-10-CM | POA: Diagnosis not present

## 2015-06-29 DIAGNOSIS — F17200 Nicotine dependence, unspecified, uncomplicated: Secondary | ICD-10-CM | POA: Diagnosis not present

## 2015-06-29 DIAGNOSIS — N411 Chronic prostatitis: Secondary | ICD-10-CM | POA: Diagnosis not present

## 2015-07-03 DIAGNOSIS — R39191 Need to immediately re-void: Secondary | ICD-10-CM | POA: Diagnosis not present

## 2015-07-03 DIAGNOSIS — R351 Nocturia: Secondary | ICD-10-CM | POA: Diagnosis not present

## 2015-07-03 DIAGNOSIS — N3020 Other chronic cystitis without hematuria: Secondary | ICD-10-CM | POA: Diagnosis not present

## 2015-07-03 DIAGNOSIS — R339 Retention of urine, unspecified: Secondary | ICD-10-CM | POA: Diagnosis not present

## 2015-07-03 DIAGNOSIS — N401 Enlarged prostate with lower urinary tract symptoms: Secondary | ICD-10-CM | POA: Diagnosis not present

## 2015-07-03 DIAGNOSIS — N318 Other neuromuscular dysfunction of bladder: Secondary | ICD-10-CM | POA: Diagnosis not present

## 2015-07-03 DIAGNOSIS — R3912 Poor urinary stream: Secondary | ICD-10-CM | POA: Diagnosis not present

## 2015-07-05 ENCOUNTER — Telehealth: Payer: Self-pay | Admitting: *Deleted

## 2015-07-05 NOTE — Telephone Encounter (Signed)
Patient says he never received equipment for remote device check.  Please call to discuss.

## 2015-07-05 NOTE — Telephone Encounter (Signed)
LMOVM for pt to return call 

## 2015-07-06 NOTE — Telephone Encounter (Signed)
Spoke w/ pt and informed him that I ordered a home monitor for home on Wednesday 07-05-15. Pt verbalized understanding.

## 2015-07-06 NOTE — Telephone Encounter (Signed)
2nd attempt  Attempted to call pt back. No answer and unable to leave a message b/c voicemail was full.

## 2015-07-07 ENCOUNTER — Ambulatory Visit (INDEPENDENT_AMBULATORY_CARE_PROVIDER_SITE_OTHER): Payer: 59 | Admitting: Cardiovascular Disease

## 2015-07-07 ENCOUNTER — Encounter: Payer: Self-pay | Admitting: Cardiovascular Disease

## 2015-07-07 VITALS — BP 128/78 | HR 66 | Ht 68.0 in | Wt 196.5 lb

## 2015-07-07 DIAGNOSIS — E785 Hyperlipidemia, unspecified: Secondary | ICD-10-CM

## 2015-07-07 DIAGNOSIS — I472 Ventricular tachycardia, unspecified: Secondary | ICD-10-CM

## 2015-07-07 DIAGNOSIS — I255 Ischemic cardiomyopathy: Secondary | ICD-10-CM

## 2015-07-07 DIAGNOSIS — I2510 Atherosclerotic heart disease of native coronary artery without angina pectoris: Secondary | ICD-10-CM

## 2015-07-07 DIAGNOSIS — R55 Syncope and collapse: Secondary | ICD-10-CM

## 2015-07-07 DIAGNOSIS — Z720 Tobacco use: Secondary | ICD-10-CM

## 2015-07-07 DIAGNOSIS — Z95810 Presence of automatic (implantable) cardiac defibrillator: Secondary | ICD-10-CM | POA: Diagnosis not present

## 2015-07-07 DIAGNOSIS — F17200 Nicotine dependence, unspecified, uncomplicated: Secondary | ICD-10-CM

## 2015-07-07 NOTE — Assessment & Plan Note (Signed)
Cholesterol is at goal on the current lipid regimen. No changes to the medications were made.  

## 2015-07-07 NOTE — Assessment & Plan Note (Signed)
Denies having any active angina, no chest pain

## 2015-07-07 NOTE — Assessment & Plan Note (Signed)
Currently on amiodarone. ICD download by Dr. Caryl Comes

## 2015-07-07 NOTE — Assessment & Plan Note (Signed)
No recent episodes of syncope No medication changes made He reports prior episode secondary to hypotension, overmedication

## 2015-07-07 NOTE — Patient Instructions (Signed)
You are doing well. No medication changes were made.  Ok to restart plavix one a day  Please call us if you have new issues that need to be addressed before your next appt.  Your physician wants you to follow-up in: 6 months.  You will receive a reminder letter in the mail two months in advance. If you don't receive a letter, please call our office to schedule the follow-up appointment.

## 2015-07-07 NOTE — Assessment & Plan Note (Signed)
We have encouraged him to continue to work on weaning his cigarettes and smoking cessation. He will continue to work on this and does not want any assistance with chantix.  

## 2015-07-07 NOTE — Assessment & Plan Note (Signed)
Recent stress test August 2016 Large region of scar Currently with no angina

## 2015-07-07 NOTE — Progress Notes (Signed)
Patient ID: Ian Mills, male    DOB: 08-Feb-1956, 59 y.o.   MRN: [id number]  HPI Comments: [name] is a 31 -year-old gentleman with a history of coronary artery disease, 3 stents placed to his mid LAD, one stent to the PDA, one stent to the OM 3 in February 2008 with episodes of chest pressure during his divorce with stress test at [facility], followup cardiac catheterization at [facility], History of syncope x4 episodes total over the past several years.  He is followed by Dr. Virl Axe for ICD. He presents today for follow-up of his coronary artery disease. He has started to smoke again.   In follow-up today, he reports having urinary tract infection, urine retention requiring prostate surgery He has since recovered, has been off his Plavix for 2 weeks for the surgery, has not restarted Denies any chest pain concerning for angina No regular exercise program. Reports compliance with his medications  previousnuclear stress testing on 03/06/2015 that showed hypertensive response to exercise with a large defect of severe severity present in the mid anterior, mid anteroseptal, apical anterior, apical inferior and apex location with no significant reversibility, findings were consistent with prior MI. EF 27%.   Patient presented to [facility] ED following the stress test with complaints of sudden onset of documented intermittent chest pain, SOB, and lightheadedness. Troponin was negative x 1. CXR showed no active cardiopulmonary disease. He was found to be hypokalemic at 3.3. ECG showed NSR, 83 bpm, left axis deviation, cannot rule put anterolateral infarct. He ended up leaving AMA from the ED.  In follow-up,he reports he never had chest pain. He was feeling dizzy post stress test and that is why he went to the ED.  EKG on today's visit shows normal sinus rhythm with rate 66 bpm, old anterior MI, left axis deviation  Other past medical history  episode of syncope 05/29/2014. He got up  overnight to go to the bathroom, after urination he felt very lightheaded, had syncope, hit his head. He went to the hospital and was evaluated.  Notes from the hospital were reviewed including the testing, lab work and consultation notes Seen by cardiology It was felt he had vasovagal syncope. ICD was interrogated showing no arrhythmia Carotid ultrasound showed only mild to moderate carotid arterial disease bilaterally Echocardiogram was essentially unchanged with ejection fraction 25-30%   May 2013, he had  ventricular tachycardia while in the emergency room while at work. He was diaphoretic, required resuscitation. He was transferred to [facility] and had a cardiac catheterization with no PCI. Chronically occluded LAD, moderately diseased RCA. He had defibrillator placed 12/01/2011 presumably given his large anterior scar and the VT requiring defibrillation in the emergency room.  Emergency room visit 08/17/2012 for chest pain. Cardiac enzymes negative. Discharged home 1 previous episode of nausea, lightheadedness, near syncope.  run of nonsustained VT on 11/06/2012, did not correlate with symptoms   MRI done of his heart in February 2008 showed anterior and anterolateral region was nonviable. Ejection fraction by catheterization at that time was 35%.   hospital admission 06/28/2013 for syncope, malaise, chest pain. He had a fight with his separated wife, at that time had syncope. He presented to the hospital, cardiac enzymes were negative but considering is frequent episodes of chest pain, cardiac catheterization was performed. Cardiac cath on 06/29/2013 done by radial access showed 70% proximal RCA disease with DES placed. FFR was done to confirm stenosis. He had a patent stent in the left circumflex,  chronically occluded LAD, ejection fraction 20%   previous lab work in 2014  showing Total cholesterol 107, LDL 50, HDL 33  Allergies  Allergen Reactions  . Nitroglycerin     Nauseas     Outpatient Encounter Prescriptions as of 07/07/2015  Medication Sig  . amiodarone (PACERONE) 200 MG tablet TAKE 1 TABLET BY MOUTH ONCE DAILY FIVE DAYS A WEEK  . aspirin 81 MG EC tablet Take 1 tablet (81 mg total) by mouth daily.  Marland Kitchen atorvastatin (LIPITOR) 80 MG tablet TAKE 1 TABLET BY MOUTH EVERY DAY  . carvedilol (COREG) 3.125 MG tablet TAKE 1 TABLET BY MOUTH TWICE DAILY  . ciprofloxacin (CIPRO) 500 MG tablet TK 1 T PO  BID  . ezetimibe (ZETIA) 10 MG tablet Take 1 tablet (10 mg total) by mouth daily.  . furosemide (LASIX) 40 MG tablet TAKE 1 TABLET BY MOUTH EVERY DAY  . hydrochlorothiazide (HYDRODIURIL) 25 MG tablet Take 1 tablet (25 mg total) by mouth daily as needed.  Marland Kitchen HYDROcodone-acetaminophen (NORCO) 7.5-325 MG tablet TK 1 T PO  Q 6 H PRN P  . KLOR-CON 10 10 MEQ tablet take 1 tablet by mouth once daily  . nitroGLYCERIN (NITROSTAT) 0.4 MG SL tablet Place 1 tablet (0.4 mg total) under the tongue every 5 (five) minutes as needed for chest pain.  . phenazopyridine (PYRIDIUM) 200 MG tablet Take 200 mg by mouth 3 (three) times daily.   . sacubitril-valsartan (ENTRESTO) 49-51 MG Take 1 tablet by mouth 2 (two) times daily.  . tadalafil (CIALIS) 10 MG tablet Take 1 tablet (10 mg total) by mouth daily as needed for erectile dysfunction (take 30 min prior to when needed.).  Marland Kitchen zolpidem (AMBIEN) 10 MG tablet Take 1 tablet (10 mg total) by mouth at bedtime as needed.  . clopidogrel (PLAVIX) 75 MG tablet TAKE 1 TABLET BY MOUTH EVERY DAY (Patient not taking: Reported on 07/07/2015)   No facility-administered encounter medications on file as of 07/07/2015.    Past Medical History  Diagnosis Date  . Ischemic cardiomyopathy     a. echo: 05/2014: EF 25-30%, AK of mid-dist ant, apical and dist inf walls, mod LVH, DD, mod dilated LA, mildly dilated RA, mild MR, mild Ao scl w/o stenosis    . HLD (hyperlipidemia)   . HTN (hypertension)   . Ventricular tachycardia ([place]) 11/2011    a. s/p AICD implant   .  ICD  single,BSX     a. DOI 11/2011; b. S/N# [id number]  . Coronary artery disease     a. 3 stents to the LAD, 1 to the PDA, and 1 to the OM3; b. cath 06/2013: chronically occluded LAD, patent stent LAD, D1 20%, pLCx 30%, mLCx 20%, pRCA 70% (FFR 0.74) s/p PCI/DES, mRCA 70% s/p PCI/DES, RPDA 50%, EF 20% w/ AK of anterolateral & apical walls, moderately elevated LVEDP  . Syncope and collapse     Past Surgical History  Procedure Laterality Date  . Cardiac defibrillator placement  May 2013  . Tumor removal      chest  . Appendectomy    . Cardiac catheterization  Nov 30 2011    [facility]  . Coronary angioplasty  2014    x 2 stents  . Coronary stent placement      x7  . Prostate surgery      Social History  reports that he quit smoking about 3 years ago. His smoking use included Cigarettes. He has a 7 pack-year smoking history. He does not  have any smokeless tobacco history on file. He reports that he drinks about 7.2 oz of alcohol per week. He reports that he does not use illicit drugs.  Family History Family history is unknown by patient.   Review of Systems  Constitutional: Positive for fatigue.  Respiratory: Negative.   Cardiovascular: Negative.   Musculoskeletal: Negative.   Neurological: Negative.   Hematological: Negative.   All other systems reviewed and are negative.  BP 128/78 mmHg  Pulse 66  Ht 5\' 8"  (1.727 m)  Wt 196 lb 8 oz (89.132 kg)  BMI 29.88 kg/m2  Physical Exam  Constitutional: He is oriented to person, place, and time. He appears well-developed and well-nourished.  HENT:  Head: Normocephalic.  Nose: Nose normal.  Mouth/Throat: Oropharynx is clear and moist.  Eyes: Conjunctivae are normal. Pupils are equal, round, and reactive to light.  Neck: Normal range of motion. Neck supple. No JVD present.  Cardiovascular: Normal rate, regular rhythm, S1 normal, S2 normal, normal heart sounds and intact distal pulses.  Exam reveals no gallop and no friction rub.   No murmur  heard. Pulmonary/Chest: Effort normal and breath sounds normal. No respiratory distress. He has no wheezes. He has no rales. He exhibits no tenderness.  Abdominal: Soft. Bowel sounds are normal. He exhibits no distension. There is no tenderness.  Musculoskeletal: Normal range of motion. He exhibits no edema or tenderness.  Lymphadenopathy:    He has no cervical adenopathy.  Neurological: He is alert and oriented to person, place, and time. Coordination normal.  Skin: Skin is warm and dry. No rash noted. No erythema.  Psychiatric: He has a normal mood and affect. His behavior is normal. Judgment and thought content normal.      Assessment and Plan   Nursing note and vitals reviewed.

## 2015-07-07 NOTE — Assessment & Plan Note (Signed)
Managed by Dr. Caryl Comes Home monitoring still in progress

## 2015-07-17 DIAGNOSIS — R339 Retention of urine, unspecified: Secondary | ICD-10-CM | POA: Diagnosis not present

## 2015-07-17 DIAGNOSIS — N401 Enlarged prostate with lower urinary tract symptoms: Secondary | ICD-10-CM | POA: Diagnosis not present

## 2015-07-17 DIAGNOSIS — N3020 Other chronic cystitis without hematuria: Secondary | ICD-10-CM | POA: Diagnosis not present

## 2015-07-17 DIAGNOSIS — N318 Other neuromuscular dysfunction of bladder: Secondary | ICD-10-CM | POA: Diagnosis not present

## 2015-07-17 DIAGNOSIS — R39191 Need to immediately re-void: Secondary | ICD-10-CM | POA: Diagnosis not present

## 2015-07-20 ENCOUNTER — Other Ambulatory Visit: Payer: Self-pay | Admitting: Cardiovascular Disease

## 2015-08-15 DIAGNOSIS — R3912 Poor urinary stream: Secondary | ICD-10-CM | POA: Diagnosis not present

## 2015-08-15 DIAGNOSIS — R338 Other retention of urine: Secondary | ICD-10-CM | POA: Diagnosis not present

## 2015-08-15 DIAGNOSIS — N401 Enlarged prostate with lower urinary tract symptoms: Secondary | ICD-10-CM | POA: Diagnosis not present

## 2015-08-15 DIAGNOSIS — N318 Other neuromuscular dysfunction of bladder: Secondary | ICD-10-CM | POA: Diagnosis not present

## 2015-08-15 DIAGNOSIS — R39191 Need to immediately re-void: Secondary | ICD-10-CM | POA: Diagnosis not present

## 2015-08-15 DIAGNOSIS — N3020 Other chronic cystitis without hematuria: Secondary | ICD-10-CM | POA: Diagnosis not present

## 2015-09-04 DIAGNOSIS — I2510 Atherosclerotic heart disease of native coronary artery without angina pectoris: Secondary | ICD-10-CM | POA: Diagnosis not present

## 2015-09-04 DIAGNOSIS — Z87891 Personal history of nicotine dependence: Secondary | ICD-10-CM | POA: Diagnosis not present

## 2015-09-04 DIAGNOSIS — Z720 Tobacco use: Secondary | ICD-10-CM | POA: Diagnosis not present

## 2015-09-05 ENCOUNTER — Other Ambulatory Visit: Payer: Self-pay | Admitting: Cardiovascular Disease

## 2015-09-05 ENCOUNTER — Other Ambulatory Visit: Payer: Self-pay | Admitting: Physician Assistant

## 2015-09-11 ENCOUNTER — Telehealth: Payer: Self-pay | Admitting: Internal Medicine

## 2015-09-11 MED ORDER — TADALAFIL 10 MG PO TABS: 10.0000 mg | ORAL_TABLET | Freq: Every day | ORAL | Status: DC | PRN

## 2015-09-11 MED ORDER — ASPIRIN 81 MG PO TBEC: 81.0000 mg | DELAYED_RELEASE_TABLET | Freq: Every day | ORAL | Status: DC

## 2015-09-11 NOTE — Telephone Encounter (Signed)
Patient calling the office for samples of medication:   1.  What medication and dosage are you requesting samples for? Asprin and Cialas   2.  Are you currently out of this medication?  Yes

## 2015-09-11 NOTE — Telephone Encounter (Signed)
Refill sent for aspirin and cialis.

## 2015-09-26 DIAGNOSIS — E291 Testicular hypofunction: Secondary | ICD-10-CM | POA: Diagnosis not present

## 2015-09-26 DIAGNOSIS — N318 Other neuromuscular dysfunction of bladder: Secondary | ICD-10-CM | POA: Diagnosis not present

## 2015-09-26 DIAGNOSIS — R338 Other retention of urine: Secondary | ICD-10-CM | POA: Diagnosis not present

## 2015-09-26 DIAGNOSIS — N401 Enlarged prostate with lower urinary tract symptoms: Secondary | ICD-10-CM | POA: Diagnosis not present

## 2015-09-26 DIAGNOSIS — R351 Nocturia: Secondary | ICD-10-CM | POA: Diagnosis not present

## 2015-09-26 DIAGNOSIS — N3020 Other chronic cystitis without hematuria: Secondary | ICD-10-CM | POA: Diagnosis not present

## 2015-09-26 DIAGNOSIS — N5203 Combined arterial insufficiency and corporo-venous occlusive erectile dysfunction: Secondary | ICD-10-CM | POA: Diagnosis not present

## 2015-10-02 ENCOUNTER — Other Ambulatory Visit: Payer: Self-pay | Admitting: Cardiovascular Disease

## 2015-10-17 ENCOUNTER — Encounter: Payer: Self-pay | Admitting: *Deleted

## 2015-10-26 DIAGNOSIS — R338 Other retention of urine: Secondary | ICD-10-CM | POA: Diagnosis not present

## 2015-10-26 DIAGNOSIS — N3020 Other chronic cystitis without hematuria: Secondary | ICD-10-CM | POA: Diagnosis not present

## 2015-10-26 DIAGNOSIS — N318 Other neuromuscular dysfunction of bladder: Secondary | ICD-10-CM | POA: Diagnosis not present

## 2015-10-26 DIAGNOSIS — N401 Enlarged prostate with lower urinary tract symptoms: Secondary | ICD-10-CM | POA: Diagnosis not present

## 2015-10-26 DIAGNOSIS — N5203 Combined arterial insufficiency and corporo-venous occlusive erectile dysfunction: Secondary | ICD-10-CM | POA: Diagnosis not present

## 2015-10-26 DIAGNOSIS — R351 Nocturia: Secondary | ICD-10-CM | POA: Diagnosis not present

## 2015-11-13 DIAGNOSIS — R635 Abnormal weight gain: Secondary | ICD-10-CM | POA: Diagnosis not present

## 2015-11-13 DIAGNOSIS — Z79899 Other long term (current) drug therapy: Secondary | ICD-10-CM | POA: Diagnosis not present

## 2015-11-13 DIAGNOSIS — E663 Overweight: Secondary | ICD-10-CM | POA: Diagnosis not present

## 2015-11-13 DIAGNOSIS — Z1389 Encounter for screening for other disorder: Secondary | ICD-10-CM | POA: Diagnosis not present

## 2015-11-13 DIAGNOSIS — Z6828 Body mass index (BMI) 28.0-28.9, adult: Secondary | ICD-10-CM | POA: Diagnosis not present

## 2015-11-14 ENCOUNTER — Ambulatory Visit (INDEPENDENT_AMBULATORY_CARE_PROVIDER_SITE_OTHER): Payer: 59 | Admitting: *Deleted

## 2015-11-14 ENCOUNTER — Encounter: Payer: Self-pay | Admitting: Internal Medicine

## 2015-11-14 DIAGNOSIS — Z95810 Presence of automatic (implantable) cardiac defibrillator: Secondary | ICD-10-CM | POA: Diagnosis not present

## 2015-11-15 LAB — CUP PACEART INCLINIC DEVICE CHECK
Brady Statistic RV Percent Paced: 1 % — CL
HighPow Impedance: 88 Ohm
Implantable Lead Implant Date: 20130503
Implantable Lead Location: 753860
Lead Channel Pacing Threshold Amplitude: 0.9 V
Lead Channel Setting Pacing Amplitude: 2.4 V
Lead Channel Setting Pacing Pulse Width: 0.5 ms
Lead Channel Setting Sensing Sensitivity: 0.6 mV
MDC IDC LEAD MODEL: 292
MDC IDC LEAD SERIAL: 112568
MDC IDC MSMT LEADCHNL RV IMPEDANCE VALUE: 576 Ohm
MDC IDC MSMT LEADCHNL RV PACING THRESHOLD PULSEWIDTH: 0.5 ms
MDC IDC MSMT LEADCHNL RV SENSING INTR AMPL: 21.5 mV
MDC IDC PG SERIAL: 105232
MDC IDC SESS DTM: 20170418040000

## 2015-11-15 NOTE — Progress Notes (Signed)
ICD check in clinic. Normal device function. Thresholds and sensing consistent with previous device measurements. Impedance trends stable over time. 522 NSVT episodes since last check, available EGMs suggest avg rates 170s-180s (monitor zone), longest ~10sec, asymptomatic. Histogram distribution appropriate for patient and level of activity. No changes made this session. Device programmed at appropriate safety margins. Device programmed to optimize intrinsic conduction. Estimated longevity 9.5 years. Pt enrolled in remote follow-up, Latitude cell adapter ordered via rep. Patient education completed including shock plan. ROV with SK/B in 02/2016.

## 2015-12-07 DIAGNOSIS — M79651 Pain in right thigh: Secondary | ICD-10-CM | POA: Diagnosis not present

## 2015-12-07 DIAGNOSIS — M79604 Pain in right leg: Secondary | ICD-10-CM | POA: Diagnosis not present

## 2015-12-07 DIAGNOSIS — S79921A Unspecified injury of right thigh, initial encounter: Secondary | ICD-10-CM | POA: Diagnosis not present

## 2015-12-07 DIAGNOSIS — S79911A Unspecified injury of right hip, initial encounter: Secondary | ICD-10-CM | POA: Diagnosis not present

## 2015-12-24 ENCOUNTER — Other Ambulatory Visit: Payer: Self-pay | Admitting: Cardiovascular Disease

## 2016-01-17 ENCOUNTER — Telehealth: Payer: Self-pay | Admitting: Cardiovascular Disease

## 2016-01-17 NOTE — Telephone Encounter (Signed)
Patient returning call.  Aware samples are ready for pick up.  Thank you !!

## 2016-01-17 NOTE — Telephone Encounter (Signed)
Samples available of Entresto 49/51 mg for patient to pick up.  Samples placed at the front desk.

## 2016-01-17 NOTE — Telephone Encounter (Signed)
Patient does not have money to refill entresto .  Can he have a weeks worth of samples? Please call  Patient calling the office for samples of medication:   1.  What medication and dosage are you requesting samples for? Entresto 49-51 mg po  BID  2.  Are you currently out of this medication?  Yes

## 2016-02-02 ENCOUNTER — Other Ambulatory Visit: Payer: Self-pay | Admitting: Cardiovascular Disease

## 2016-02-09 ENCOUNTER — Telehealth: Payer: Self-pay | Admitting: Cardiovascular Disease

## 2016-02-09 NOTE — Telephone Encounter (Signed)
Gave samples of Entresto LOT # [id number] Exp. 04/2017.

## 2016-02-09 NOTE — Telephone Encounter (Signed)
Patient called needing samples of ENTRESTO 49-51 MG. Please call when ready to pick up. Thanks!

## 2016-03-12 ENCOUNTER — Ambulatory Visit (INDEPENDENT_AMBULATORY_CARE_PROVIDER_SITE_OTHER): Payer: 59 | Admitting: Internal Medicine

## 2016-03-12 ENCOUNTER — Encounter: Payer: Self-pay | Admitting: Internal Medicine

## 2016-03-12 VITALS — BP 104/60 | HR 59 | Ht 67.0 in | Wt 177.8 lb

## 2016-03-12 DIAGNOSIS — I10 Essential (primary) hypertension: Secondary | ICD-10-CM

## 2016-03-12 DIAGNOSIS — I472 Ventricular tachycardia, unspecified: Secondary | ICD-10-CM

## 2016-03-12 DIAGNOSIS — Z95810 Presence of automatic (implantable) cardiac defibrillator: Secondary | ICD-10-CM | POA: Diagnosis not present

## 2016-03-12 DIAGNOSIS — I5022 Chronic systolic (congestive) heart failure: Secondary | ICD-10-CM

## 2016-03-12 DIAGNOSIS — I255 Ischemic cardiomyopathy: Secondary | ICD-10-CM | POA: Diagnosis not present

## 2016-03-12 LAB — CUP PACEART INCLINIC DEVICE CHECK
HIGH POWER IMPEDANCE MEASURED VALUE: 89 Ohm
Lead Channel Impedance Value: 631 Ohm
Lead Channel Pacing Threshold Pulse Width: 0.5 ms
Lead Channel Setting Pacing Amplitude: 2.4 V
Lead Channel Setting Pacing Pulse Width: 0.5 ms
Lead Channel Setting Sensing Sensitivity: 0.6 mV
MDC IDC LEAD IMPLANT DT: 20130503
MDC IDC LEAD LOCATION: 753860
MDC IDC LEAD MODEL: 292
MDC IDC LEAD SERIAL: 112568
MDC IDC MSMT LEADCHNL RV PACING THRESHOLD AMPLITUDE: 0.9 V
MDC IDC MSMT LEADCHNL RV SENSING INTR AMPL: 20.5 mV
MDC IDC SESS DTM: 20170815040000
Pulse Gen Serial Number: 105232

## 2016-03-12 MED ORDER — AMIODARONE HCL 200 MG PO TABS: ORAL_TABLET | ORAL | Status: DC

## 2016-03-12 NOTE — Progress Notes (Signed)
Patient Care Team: Townsend Roger, MD as PCP - General (Internal Medicine)   HPI  Ian Mills is a 60 y.o. male Seen in followup for ICD implanted May 2013 following presentation with ventricular tachycardia for which he takes amiodarone .     He has had no intercurrent ICD therapy of which he is aware  His biggest problem he says his not being able to sleep and not been able to eat. He has lost 20 pounds over recent months.  Thyroid function testing apparently was checked by his pulmonary physician and was normal. He is to establish with a primary care physician later this week.  He has a history of ischemic heart disease with prior stenting of his LAD PDA and OM last in 2008. He underwent catheterization in May 2013 without further PCI. The details of that evaluation are not immediately available. MRI scanning 2008 demonstrated a large anterolateral region of non-viability ejection fraction was 35%.  Echocardiogram 11/15 demonstrated EF of 20-25% with LAD       Past Medical History:  Diagnosis Date  . Coronary artery disease    a. 3 stents to the LAD, 1 to the PDA, and 1 to the OM3; b. cath 06/2013: chronically occluded LAD, patent stent LAD, D1 20%, pLCx 30%, mLCx 20%, pRCA 70% (FFR 0.74) s/p PCI/DES, mRCA 70% s/p PCI/DES, RPDA 50%, EF 20% w/ AK of anterolateral & apical walls, moderately elevated LVEDP  . HLD (hyperlipidemia)   . HTN (hypertension)   . ICD  single,BSX    a. DOI 11/2011; b. S/N# [id number]  . Ischemic cardiomyopathy    a. echo: 05/2014: EF 25-30%, AK of mid-dist ant, apical and dist inf walls, mod LVH, DD, mod dilated LA, mildly dilated RA, mild MR, mild Ao scl w/o stenosis    . Syncope and collapse   . Ventricular tachycardia ([place]) 11/2011   a. s/p AICD implant     Past Surgical History:  Procedure Laterality Date  . appendectomy    . CARDIAC CATHETERIZATION  Nov 30 2011   [facility]  . CARDIAC DEFIBRILLATOR PLACEMENT  May 2013  . CORONARY ANGIOPLASTY  2014   x 2  stents  . CORONARY STENT PLACEMENT     x7  . PROSTATE SURGERY    . TUMOR REMOVAL     chest    Current Outpatient Prescriptions  Medication Sig Dispense Refill  . amiodarone (PACERONE) 200 MG tablet TAKE 1 TABLET BY MOUTH ONCE DAILY FIVE DAYS A WEEK 150 tablet 3  . aspirin 81 MG EC tablet Take 1 tablet (81 mg total) by mouth daily. 30 tablet 6  . atorvastatin (LIPITOR) 80 MG tablet TAKE 1 TABLET BY MOUTH EVERY DAY 30 tablet 6  . carvedilol (COREG) 3.125 MG tablet TAKE 1 TABLET BY MOUTH TWICE DAILY 180 tablet 3  . clopidogrel (PLAVIX) 75 MG tablet TAKE 1 TABLET BY MOUTH EVERY DAY 30 tablet 3  . ENTRESTO 49-51 MG TAKE 1 TABLET BY MOUTH TWICE DAILY 60 tablet 3  . ezetimibe (ZETIA) 10 MG tablet TAKE 1 TABLET BY MOUTH DAILY. 30 tablet 6  . furosemide (LASIX) 40 MG tablet TAKE 1 TABLET BY MOUTH EVERY DAY 90 tablet 2  . KLOR-CON 10 10 MEQ tablet take 1 tablet by mouth once daily 90 tablet 3  . nitroGLYCERIN (NITROSTAT) 0.4 MG SL tablet Place 1 tablet (0.4 mg total) under the tongue every 5 (five) minutes as needed for chest pain. 25 tablet 3  . phenazopyridine (  PYRIDIUM) 200 MG tablet Take 200 mg by mouth 3 (three) times daily. Reported on 11/14/2015  0  . potassium chloride (K-DUR,KLOR-CON) 10 MEQ tablet TAKE 1 TABLET BY MOUTH EVERY DAY 240 tablet 3  . tadalafil (CIALIS) 10 MG tablet Take 1 tablet (10 mg total) by mouth daily as needed for erectile dysfunction (take 30 min prior to when needed.). 10 tablet 3  . zolpidem (AMBIEN) 10 MG tablet Take 1 tablet (10 mg total) by mouth at bedtime as needed. 30 tablet 3   No current facility-administered medications for this visit.     Allergies  Allergen Reactions  . Nitroglycerin     Nauseas    Review of Systems negative except from HPI and PMH  Physical Exam BP 104/60 (BP Location: Left Arm, Patient Position: Sitting, Cuff Size: Normal)   Pulse (!) 59   Ht 5\' 7"  (1.702 m)   Wt 177 lb 12 oz (80.6 kg)   BMI 27.84 kg/m  Well developed and  nourished in no acute distress HENT normal Neck supple with JVP-flat Clear Regular rate and rhythm,+s4  no murmurs   Abd-soft with active BS No Clubbing cyanosis edema Skin-warm and dry A & Oriented  Grossly normal sensory and motor function Affect appeared sad and tearful; one after about he said his eyes were watering Device pocket well healed; without hematoma or erythema.  There is no tethering   ECG demonstrates sinus rhythm at 59 Intervals 17/09/41 Axis left -31 Prior anteroseptal MI    Assessment and  Plan  Ventricular tachycardia  Ischemic cardiomyopathy  Congestive heart failure-chronic-systolic  Exertional chest discomfort  Hypertension  Implantable defibrillator-. [employer]  Cigarette abuse    He has had no sustained VT. He has had one nonsustained VT. We will decrease his amiodarone from 1000--700 mg per week.  He has had blood work drawn by his lung doctor his PCP we will ask them to forwarded to [place].  Blood pressure limiting further up titration of his afterload reduction  He is euvolemic. We will continue him on his current dose of diuretics.  He is not sleeping he is losing weight. He has not had night sweats or chills. He is to establish with her PCP later this week. He does note that thyroid testing was apparently normal  He has stopped smoking on Wellbutrin. He has run out. I asked him to discuss this with his primary care physician on Thursday

## 2016-03-12 NOTE — Patient Instructions (Addendum)
Medication Instructions: - Your physician has recommended you make the following change in your medication:  1) Decrease amiodarone to 200 mg - take 1/2 tablet (100 mg) by mouth once daily  Labwork: - none  Procedures/Testing: - none  Follow-Up: - Your physician recommends that you schedule a follow-up appointment in: 3 months with the [place] physician wants you to follow-up in: 1 year with Dr. Caryl Comes. You will receive a reminder letter in the mail two months in advance. If you don't receive a letter, please call our office to schedule the follow-up appointment.  Any Additional Special Instructions Will Be Listed Below (If Applicable). ** Please have your primary care doctor/ whomever drew labs on you most recently to please fax TSH/ Liver panel results to Dr. Caryl Comes @ [phone].    If you need a refill on your cardiac medications before your next appointment, please call your pharmacy.

## 2016-03-14 DIAGNOSIS — Z6828 Body mass index (BMI) 28.0-28.9, adult: Secondary | ICD-10-CM | POA: Diagnosis not present

## 2016-03-14 DIAGNOSIS — Z1389 Encounter for screening for other disorder: Secondary | ICD-10-CM | POA: Diagnosis not present

## 2016-03-14 DIAGNOSIS — M25551 Pain in right hip: Secondary | ICD-10-CM | POA: Diagnosis not present

## 2016-03-14 DIAGNOSIS — G4700 Insomnia, unspecified: Secondary | ICD-10-CM | POA: Diagnosis not present

## 2016-03-21 DIAGNOSIS — M1611 Unilateral primary osteoarthritis, right hip: Secondary | ICD-10-CM | POA: Diagnosis not present

## 2016-03-21 DIAGNOSIS — M25551 Pain in right hip: Secondary | ICD-10-CM | POA: Diagnosis not present

## 2016-03-22 ENCOUNTER — Ambulatory Visit: Payer: 59 | Admitting: Cardiovascular Disease

## 2016-03-28 DIAGNOSIS — M1611 Unilateral primary osteoarthritis, right hip: Secondary | ICD-10-CM | POA: Diagnosis not present

## 2016-03-28 DIAGNOSIS — M25551 Pain in right hip: Secondary | ICD-10-CM | POA: Diagnosis not present

## 2016-03-29 ENCOUNTER — Telehealth: Payer: Self-pay | Admitting: *Deleted

## 2016-03-29 NOTE — Telephone Encounter (Signed)
Called patient to advise that his Latitude home monitor is correctly set up and transmitting.  Advised that we can cancel his 3 month device clinic appointment.  Patient is agreeable to canceling in-clinic appointment and scheduling remote transmission for 06/13/16.  He denies any additional questions or concerns at this time and verbalizes appreciation of call.

## 2016-04-08 ENCOUNTER — Encounter: Payer: Self-pay | Admitting: Internal Medicine

## 2016-04-10 DIAGNOSIS — M1611 Unilateral primary osteoarthritis, right hip: Secondary | ICD-10-CM | POA: Diagnosis not present

## 2016-04-11 ENCOUNTER — Other Ambulatory Visit: Payer: Self-pay | Admitting: Cardiovascular Disease

## 2016-04-16 ENCOUNTER — Telehealth: Payer: Self-pay

## 2016-04-16 NOTE — Telephone Encounter (Signed)
Pt would like Entresto samples. Please call.

## 2016-04-16 NOTE — Telephone Encounter (Signed)
[facility] samples of Entresto 49-51mg  are available to pick up. Entresto samples placed at the front desk.

## 2016-04-29 DIAGNOSIS — M25551 Pain in right hip: Secondary | ICD-10-CM | POA: Diagnosis not present

## 2016-05-02 ENCOUNTER — Ambulatory Visit: Payer: 59 | Admitting: Cardiovascular Disease

## 2016-05-03 DIAGNOSIS — M87051 Idiopathic aseptic necrosis of right femur: Secondary | ICD-10-CM | POA: Diagnosis not present

## 2016-05-10 DIAGNOSIS — Z720 Tobacco use: Secondary | ICD-10-CM | POA: Diagnosis not present

## 2016-05-10 DIAGNOSIS — F330 Major depressive disorder, recurrent, mild: Secondary | ICD-10-CM | POA: Diagnosis not present

## 2016-05-13 ENCOUNTER — Encounter: Payer: Self-pay | Admitting: Cardiovascular Disease

## 2016-05-13 ENCOUNTER — Ambulatory Visit (INDEPENDENT_AMBULATORY_CARE_PROVIDER_SITE_OTHER): Payer: 59 | Admitting: Cardiovascular Disease

## 2016-05-13 VITALS — BP 144/76 | HR 75 | Ht 67.0 in | Wt 182.0 lb

## 2016-05-13 DIAGNOSIS — I2510 Atherosclerotic heart disease of native coronary artery without angina pectoris: Secondary | ICD-10-CM | POA: Diagnosis not present

## 2016-05-13 DIAGNOSIS — R55 Syncope and collapse: Secondary | ICD-10-CM | POA: Diagnosis not present

## 2016-05-13 DIAGNOSIS — J40 Bronchitis, not specified as acute or chronic: Secondary | ICD-10-CM

## 2016-05-13 DIAGNOSIS — I472 Ventricular tachycardia, unspecified: Secondary | ICD-10-CM

## 2016-05-13 DIAGNOSIS — E7800 Pure hypercholesterolemia, unspecified: Secondary | ICD-10-CM | POA: Diagnosis not present

## 2016-05-13 DIAGNOSIS — F17200 Nicotine dependence, unspecified, uncomplicated: Secondary | ICD-10-CM

## 2016-05-13 DIAGNOSIS — Z95810 Presence of automatic (implantable) cardiac defibrillator: Secondary | ICD-10-CM

## 2016-05-13 DIAGNOSIS — I255 Ischemic cardiomyopathy: Secondary | ICD-10-CM

## 2016-05-13 MED ORDER — AZITHROMYCIN 250 MG PO TABS: ORAL_TABLET | ORAL | 0 refills | Status: DC

## 2016-05-13 MED ORDER — AMIODARONE HCL 200 MG PO TABS: 200.0000 mg | ORAL_TABLET | Freq: Two times a day (BID) | ORAL | 3 refills | Status: DC | PRN

## 2016-05-13 NOTE — Addendum Note (Signed)
Addended by: Dede Query R on: 05/13/2016 01:16 PM   Modules accepted: Orders

## 2016-05-13 NOTE — Progress Notes (Signed)
Cardiology Office Note  Date:  05/13/2016   ID:  Ian Mills, DOB [birth year], MRN [id number]  PCP:  Ian Roger, MD   Chief Complaint  Patient presents with  . other    Cardiac clearance for hip surgery 05/28/16 in [place].    HPI:  Ian Mills is a 60 -year-old gentleman with a history of coronary artery disease, 3 stents placed to his mid LAD, one stent to the PDA, one stent to the OM 3 in February 2008 with episodes of chest pressure during his divorce with stress test at [facility], followup cardiac catheterization at [facility], History of syncope x4 episodes total over the past several years.  He is followed by Dr. Virl Axe for ICD. He presents today for follow-up of his coronary artery disease.  Severe right hip pain 5 months Getting worse, Scheduled to have total hip replacement end of October 2017-Ian Mills, Ian (orthopedics) [facility]  Cough and congestion one week Down 16 pounds Insomnia Smokes some daily  Denies any significant symptoms concerning for angina Limited in his ability to exert himself secondary to hip pain No regular exercise program  EKG on today's visit shows normal sinus rhythm with rate 75 bpm, old anterior MI, no change from prior EKG  Other past medical history reviewed Previous nuclear stress testing on 03/06/2015 that showed hypertensive response to exercise with a large fixed defect of severe severity present in the mid anterior, mid anteroseptal, apical anterior, apical inferior and apex location with no significant reversibility, findings were consistent with prior MI. EF 27%.    presented to [facility] ED following the stress test with complaints of sudden onset of  intermittent chest pain, SOB, and lightheadedness. Troponin was negative . CXR showed no active cardiopulmonary disease. He was found to be hypokalemic at 3.3. But  episode of syncope 05/29/2014. He got up overnight to go to the bathroom, after  urination he felt very lightheaded, had syncope, hit his head. He went to the hospital and was evaluated.  Notes from the hospital were reviewed including the testing, lab work and consultation notes Seen by cardiology It was felt he had vasovagal syncope. ICD was interrogated showing no arrhythmia Carotid ultrasound showed only mild to moderate carotid arterial disease bilaterally Echocardiogram was essentially unchanged with ejection fraction 25-30%   May 2013, he had  ventricular tachycardia while in the emergency room while at work. He was diaphoretic, required resuscitation. He was transferred to [facility] and had a cardiac catheterization with no PCI. Chronically occluded LAD, moderately diseased RCA. He had defibrillator placed 12/01/2011 presumably given his large anterior scar and the VT requiring defibrillation  Emergency room visit 08/17/2012 for chest pain. Cardiac enzymes negative. Discharged home 1 previous episode of nausea, lightheadedness, near syncope.  run of nonsustained VT on 11/06/2012, did not correlate with symptoms   MRI done of his heart in February 2008 showed anterior and anterolateral region was nonviable. Ejection fraction by catheterization at that time was 35%.   hospital admission 06/28/2013 for syncope, malaise, chest pain. He had a fight with his separated wife, at that time had syncope. He presented to the hospital, cardiac enzymes were negative but considering is frequent episodes of chest pain, cardiac catheterization was performed. Cardiac cath on 06/29/2013 done by radial access showed 70% proximal RCA disease with DES placed. FFR was done to confirm stenosis. He had a patent stent in the left circumflex, chronically occluded LAD, ejection fraction 20%   previous  lab work in 2014  showing Total cholesterol 107, LDL 50, HDL 33   PMH:   has a past medical history of Coronary artery disease; HLD (hyperlipidemia); HTN (hypertension); ICD  single,BSX; Ischemic  cardiomyopathy; Syncope and collapse; and Ventricular tachycardia ([place]) (11/2011).  PSH:    Past Surgical History:  Procedure Laterality Date  . appendectomy    . CARDIAC CATHETERIZATION  Nov 30 2011   [facility]  . CARDIAC DEFIBRILLATOR PLACEMENT  May 2013  . CORONARY ANGIOPLASTY  2014   x 2 stents  . CORONARY STENT PLACEMENT     x7  . PROSTATE SURGERY    . TUMOR REMOVAL     chest    Current Outpatient Prescriptions  Medication Sig Dispense Refill  . amiodarone (PACERONE) 200 MG tablet Take 1 tablet (200 mg total) by mouth 2 (two) times daily as needed. 60 tablet 3  . aspirin 81 MG EC tablet Take 1 tablet (81 mg total) by mouth daily. 30 tablet 6  . atorvastatin (LIPITOR) 80 MG tablet TAKE 1 TABLET BY MOUTH EVERY DAY 30 tablet 6  . carvedilol (COREG) 3.125 MG tablet TAKE 1 TABLET BY MOUTH TWICE DAILY 180 tablet 3  . clopidogrel (PLAVIX) 75 MG tablet TAKE 1 TABLET BY MOUTH EVERY DAY 30 tablet 3  . ENTRESTO 49-51 MG TAKE 1 TABLET BY MOUTH TWICE DAILY 60 tablet 3  . ezetimibe (ZETIA) 10 MG tablet TAKE 1 TABLET BY MOUTH DAILY. 30 tablet 6  . furosemide (LASIX) 40 MG tablet TAKE 1 TABLET BY MOUTH EVERY DAY 90 tablet 2  . nitroGLYCERIN (NITROSTAT) 0.4 MG SL tablet Place 1 tablet (0.4 mg total) under the tongue every 5 (five) minutes as needed for chest pain. 25 tablet 3  . potassium chloride (K-DUR,KLOR-CON) 10 MEQ tablet TAKE 1 TABLET BY MOUTH EVERY DAY 240 tablet 3  . tadalafil (CIALIS) 10 MG tablet Take 1 tablet (10 mg total) by mouth daily as needed for erectile dysfunction (take 30 min prior to when needed.). 10 tablet 3  . azithromycin (ZITHROMAX Z-PAK) 250 MG tablet Take 2 pills first day, then one pill a day 6 each 0  . KLOR-CON 10 10 MEQ tablet take 1 tablet by mouth once daily (Patient not taking: Reported on 05/13/2016) 90 tablet 3  . phenazopyridine (PYRIDIUM) 200 MG tablet Take 200 mg by mouth 3 (three) times daily. Reported on 11/14/2015  0  . zolpidem (AMBIEN) 10 MG tablet Take 1  tablet (10 mg total) by mouth at bedtime as needed. (Patient not taking: Reported on 05/13/2016) 30 tablet 3   No current facility-administered medications for this visit.      Allergies:   Nitroglycerin   Social History:  The patient  reports that he quit smoking about 4 years ago. His smoking use included Cigarettes. He has a 7.00 pack-year smoking history. He has never used smokeless tobacco. He reports that he drinks about 7.2 oz of alcohol per week . He reports that he does not use drugs.   Family History:   Family history is unknown by patient.    Review of Systems: Review of Systems  Constitutional: Negative.   Respiratory: Negative.   Cardiovascular: Negative.   Gastrointestinal: Negative.   Musculoskeletal: Positive for joint pain.  Neurological: Negative.   Psychiatric/Behavioral: Negative.   All other systems reviewed and are negative.    PHYSICAL EXAM: VS:  BP (!) 144/76 (BP Location: Left Arm, Patient Position: Sitting, Cuff Size: Normal)   Pulse 75  Ht 5\' 7"  (1.702 m)   Wt 182 lb (82.6 kg)   BMI 28.51 kg/m  , BMI Body mass index is 28.51 kg/m. GEN: Well nourished, well developed, in no acute distress  HEENT: normal  Neck: no JVD, carotid bruits, or masses Cardiac: RRR; no murmurs, rubs, or gallops,no edema  Respiratory:  clear to auscultation bilaterally, normal work of breathing GI: soft, nontender, nondistended, + BS MS: no deformity or atrophy  Skin: warm and dry, no rash Neuro:  Strength and sensation are intact Psych: euthymic mood, full affect    Recent Labs: No results found for requested labs within last 8760 hours.    Lipid Panel Lab Results  Component Value Date   CHOL 111 01/20/2014   HDL 39 (L) 01/20/2014   LDLCALC 54 01/20/2014   TRIG 88 01/20/2014      Wt Readings from Last 3 Encounters:  05/13/16 182 lb (82.6 kg)  03/12/16 177 lb 12 oz (80.6 kg)  07/07/15 196 lb 8 oz (89.1 kg)       ASSESSMENT AND PLAN:  Syncope and  collapse No recent episodes of syncope concerning for arrhythmia ICD followed by Dr. Caryl Comes  Coronary artery disease involving native coronary artery of native heart without angina pectoris Currently with no symptoms of angina. No further workup at this time. Continue current medication regimen.  Pure hypercholesterolemia Cholesterol is at goal on the current lipid regimen. No changes to the medications were made.  Ventricular tachycardia ([place]) Remote history of VT, no recent ICD shock. Followed by EP  Ischemic cardiomyopathy Appears relatively euvolemic on today's visit. Would recommend minimal IV fluids in his upcoming total hip replacement surgery Samples of entersto provided  Single implantable cardioverter-defibrillator-BSx  Smoker We have encouraged him to continue to work on weaning his cigarettes and smoking cessation. He will continue to work on this and does not want any assistance with chantix.   Bronchitis Symptoms concerning for bronchitis. Z-Pak provided  Disposition:   F/U  6 months   Total encounter time more than 25 minutes  Greater than 50% was spent in counseling and coordination of care with the patient   Signed, Esmond Plants, M.D., Ph.D. 05/13/2016  [place], [place]

## 2016-05-13 NOTE — Patient Instructions (Addendum)
Medication Instructions:  Please take Zpak 2 pill today, then one pill daily for additional days  Please stop plavix 5 days before the surgery Stay on asa  Several days before the surgery, take amiodarone 200 mg twice a day, Stay on amiodarone full 200 mg after the surgery  Labwork:  No new labs needed  Testing/Procedures:  No further testing at this time   Follow-Up: It was a pleasure seeing you in the office today. Please call us if you have new issues that need to be addressed before your next appt.  [phone]  Your physician wants you to follow-up in: 6 months.  You will receive a reminder letter in the mail two months in advance. If you don't receive a letter, please call our office to schedule the follow-up appointment.  If you need a refill on your cardiac medications before your next appointment, please call your pharmacy.    Entresto 49/51 mg Samples provided today  Samples of this drug were given to the patient, quantity 2 boxes, Lot Number [id number]

## 2016-05-18 ENCOUNTER — Other Ambulatory Visit: Payer: Self-pay | Admitting: Cardiovascular Disease

## 2016-05-24 DIAGNOSIS — M87051 Idiopathic aseptic necrosis of right femur: Secondary | ICD-10-CM | POA: Diagnosis not present

## 2016-05-28 ENCOUNTER — Ambulatory Visit: Payer: 59 | Admitting: Cardiovascular Disease

## 2016-05-31 ENCOUNTER — Other Ambulatory Visit: Payer: Self-pay | Admitting: Cardiovascular Disease

## 2016-06-13 ENCOUNTER — Ambulatory Visit (INDEPENDENT_AMBULATORY_CARE_PROVIDER_SITE_OTHER): Payer: 59 | Admitting: *Deleted

## 2016-06-13 ENCOUNTER — Telehealth: Payer: Self-pay | Admitting: Cardiology

## 2016-06-13 DIAGNOSIS — M4316 Spondylolisthesis, lumbar region: Secondary | ICD-10-CM | POA: Diagnosis not present

## 2016-06-13 DIAGNOSIS — I519 Heart disease, unspecified: Secondary | ICD-10-CM | POA: Diagnosis not present

## 2016-06-13 DIAGNOSIS — F17210 Nicotine dependence, cigarettes, uncomplicated: Secondary | ICD-10-CM | POA: Diagnosis not present

## 2016-06-13 DIAGNOSIS — I252 Old myocardial infarction: Secondary | ICD-10-CM | POA: Diagnosis not present

## 2016-06-13 DIAGNOSIS — I472 Ventricular tachycardia, unspecified: Secondary | ICD-10-CM

## 2016-06-13 DIAGNOSIS — M160 Bilateral primary osteoarthritis of hip: Secondary | ICD-10-CM | POA: Diagnosis not present

## 2016-06-13 DIAGNOSIS — M4185 Other forms of scoliosis, thoracolumbar region: Secondary | ICD-10-CM | POA: Diagnosis not present

## 2016-06-13 DIAGNOSIS — Z95810 Presence of automatic (implantable) cardiac defibrillator: Secondary | ICD-10-CM | POA: Diagnosis not present

## 2016-06-13 DIAGNOSIS — R2689 Other abnormalities of gait and mobility: Secondary | ICD-10-CM | POA: Diagnosis not present

## 2016-06-13 DIAGNOSIS — Z720 Tobacco use: Secondary | ICD-10-CM

## 2016-06-13 DIAGNOSIS — M5136 Other intervertebral disc degeneration, lumbar region: Secondary | ICD-10-CM | POA: Insufficient documentation

## 2016-06-13 DIAGNOSIS — M898X8 Other specified disorders of bone, other site: Secondary | ICD-10-CM | POA: Diagnosis not present

## 2016-06-13 HISTORY — DX: Tobacco use: Z72.0

## 2016-06-13 NOTE — Telephone Encounter (Signed)
LMOVM reminding pt to send remote transmission.   

## 2016-06-14 NOTE — Progress Notes (Signed)
Remote ICD transmission.   

## 2016-06-19 ENCOUNTER — Encounter: Payer: Self-pay | Admitting: Cardiology

## 2016-06-22 DIAGNOSIS — Z95810 Presence of automatic (implantable) cardiac defibrillator: Secondary | ICD-10-CM | POA: Diagnosis not present

## 2016-06-22 DIAGNOSIS — I21A1 Myocardial infarction type 2: Secondary | ICD-10-CM | POA: Diagnosis present

## 2016-06-22 DIAGNOSIS — R000 Tachycardia, unspecified: Secondary | ICD-10-CM | POA: Diagnosis not present

## 2016-06-22 DIAGNOSIS — J9600 Acute respiratory failure, unspecified whether with hypoxia or hypercapnia: Secondary | ICD-10-CM | POA: Diagnosis present

## 2016-06-22 DIAGNOSIS — I160 Hypertensive urgency: Secondary | ICD-10-CM | POA: Diagnosis present

## 2016-06-22 DIAGNOSIS — I2510 Atherosclerotic heart disease of native coronary artery without angina pectoris: Secondary | ICD-10-CM | POA: Diagnosis present

## 2016-06-22 DIAGNOSIS — I252 Old myocardial infarction: Secondary | ICD-10-CM | POA: Diagnosis not present

## 2016-06-22 DIAGNOSIS — Z955 Presence of coronary angioplasty implant and graft: Secondary | ICD-10-CM | POA: Diagnosis not present

## 2016-06-22 DIAGNOSIS — D649 Anemia, unspecified: Secondary | ICD-10-CM | POA: Diagnosis present

## 2016-06-22 DIAGNOSIS — J440 Chronic obstructive pulmonary disease with acute lower respiratory infection: Secondary | ICD-10-CM | POA: Diagnosis present

## 2016-06-22 DIAGNOSIS — I5023 Acute on chronic systolic (congestive) heart failure: Secondary | ICD-10-CM | POA: Diagnosis present

## 2016-06-22 DIAGNOSIS — E872 Acidosis: Secondary | ICD-10-CM | POA: Diagnosis not present

## 2016-06-22 DIAGNOSIS — I110 Hypertensive heart disease with heart failure: Secondary | ICD-10-CM | POA: Diagnosis not present

## 2016-06-22 DIAGNOSIS — E119 Type 2 diabetes mellitus without complications: Secondary | ICD-10-CM | POA: Insufficient documentation

## 2016-06-22 DIAGNOSIS — I255 Ischemic cardiomyopathy: Secondary | ICD-10-CM | POA: Diagnosis present

## 2016-06-22 DIAGNOSIS — J810 Acute pulmonary edema: Secondary | ICD-10-CM | POA: Diagnosis not present

## 2016-06-22 DIAGNOSIS — R0600 Dyspnea, unspecified: Secondary | ICD-10-CM | POA: Diagnosis not present

## 2016-06-22 DIAGNOSIS — I509 Heart failure, unspecified: Secondary | ICD-10-CM | POA: Insufficient documentation

## 2016-06-22 DIAGNOSIS — E876 Hypokalemia: Secondary | ICD-10-CM | POA: Diagnosis present

## 2016-06-22 DIAGNOSIS — F17210 Nicotine dependence, cigarettes, uncomplicated: Secondary | ICD-10-CM | POA: Diagnosis present

## 2016-06-22 DIAGNOSIS — J189 Pneumonia, unspecified organism: Secondary | ICD-10-CM | POA: Diagnosis not present

## 2016-06-22 DIAGNOSIS — Z23 Encounter for immunization: Secondary | ICD-10-CM | POA: Diagnosis not present

## 2016-06-23 DIAGNOSIS — I214 Non-ST elevation (NSTEMI) myocardial infarction: Secondary | ICD-10-CM | POA: Insufficient documentation

## 2016-06-24 ENCOUNTER — Other Ambulatory Visit: Payer: Self-pay | Admitting: Cardiovascular Disease

## 2016-06-24 MED ORDER — SACUBITRIL-VALSARTAN 49-51 MG PO TABS: 1.0000 | ORAL_TABLET | Freq: Two times a day (BID) | ORAL | 3 refills | Status: DC

## 2016-06-25 ENCOUNTER — Telehealth: Payer: Self-pay

## 2016-06-25 DIAGNOSIS — E876 Hypokalemia: Secondary | ICD-10-CM | POA: Insufficient documentation

## 2016-06-25 NOTE — Telephone Encounter (Signed)
Pt wife states pt would like Entresto samples.

## 2016-06-25 NOTE — Telephone Encounter (Signed)
Patient notified samples available to pick up of Entresto, gave 2 boxes of 14 tablets each.

## 2016-06-26 ENCOUNTER — Other Ambulatory Visit: Payer: Self-pay | Admitting: Cardiovascular Disease

## 2016-06-26 ENCOUNTER — Telehealth: Payer: Self-pay

## 2016-06-26 NOTE — Telephone Encounter (Signed)
Entresto 49-51 mg has been approved from 06/26/2016 to 06/25/2017.  Patient notified of approval.

## 2016-06-26 NOTE — Telephone Encounter (Signed)
Prior Authorization for Entresto 49-51 mg forms faxed to [employer].  Waiting for approval.

## 2016-06-27 ENCOUNTER — Encounter: Payer: Self-pay | Admitting: Cardiovascular Disease

## 2016-06-27 ENCOUNTER — Ambulatory Visit (INDEPENDENT_AMBULATORY_CARE_PROVIDER_SITE_OTHER): Payer: 59 | Admitting: Cardiovascular Disease

## 2016-06-27 VITALS — BP 128/76 | HR 64 | Ht 68.0 in | Wt 182.5 lb

## 2016-06-27 DIAGNOSIS — I472 Ventricular tachycardia, unspecified: Secondary | ICD-10-CM

## 2016-06-27 DIAGNOSIS — I255 Ischemic cardiomyopathy: Secondary | ICD-10-CM | POA: Diagnosis not present

## 2016-06-27 DIAGNOSIS — M48061 Spinal stenosis, lumbar region without neurogenic claudication: Secondary | ICD-10-CM

## 2016-06-27 DIAGNOSIS — I5023 Acute on chronic systolic (congestive) heart failure: Secondary | ICD-10-CM | POA: Insufficient documentation

## 2016-06-27 DIAGNOSIS — I2510 Atherosclerotic heart disease of native coronary artery without angina pectoris: Secondary | ICD-10-CM

## 2016-06-27 DIAGNOSIS — Z95810 Presence of automatic (implantable) cardiac defibrillator: Secondary | ICD-10-CM

## 2016-06-27 DIAGNOSIS — E782 Mixed hyperlipidemia: Secondary | ICD-10-CM

## 2016-06-27 DIAGNOSIS — R0602 Shortness of breath: Secondary | ICD-10-CM

## 2016-06-27 MED ORDER — POTASSIUM CHLORIDE CRYS ER 10 MEQ PO TBCR: 15.0000 meq | EXTENDED_RELEASE_TABLET | Freq: Every day | ORAL | 3 refills | Status: DC

## 2016-06-27 NOTE — Progress Notes (Signed)
]      ROS ] 

## 2016-06-27 NOTE — Progress Notes (Signed)
Cardiology Office Note  Date:  06/27/2016   ID:  Ian Mills, DOB December 28, 1955, MRN [id number]  PCP:  Townsend Roger, MD   Chief Complaint  Patient presents with  . other    Pt recently in hospital due to pneumonia. Requesting Records from [facility] reviewed verbally with pt.    HPI:  Ian Mills is a 60 -year-old gentleman with a history of coronary artery disease, 3 stents placed to his mid LAD, one stent to the PDA, one stent to the OM 3 in February 2008 with episodes of chest pressure during his divorce with stress test at [facility], followup cardiac catheterization at [facility], History of syncope x4 episodes total over the past several years. He is followed by Dr. Virl Axe for ICD. He presents today for follow-up of his coronary artery disease.  In follow-up today he reports acute onset shortness of breath is past Saturday, associated with pounding of his heart, tachycardia He was taken by EMTs to [facility] There he was treated for acute on chronic systolic CHF, treated with IV Lasix Echocardiogram confirmed ejection fraction 30-35%, unchanged from previous studies ICD was not interrogated Peak troponin 0.6. Initially was given antibiotics for pneumonia, this was discontinued Reports last download was prior to the event  Since discharge, main complaint has been chronic back and hip pain This is been a long-standing issue On his last clinic visit had preoperative clearance for surgery Was told that he did not need hip surgery, instead needs evaluation of his lumbar spine There was concern of spinal stenosis though no imaging studies have been performed He is frustrated, reports having difficulty walking, pain radiating into his right hip, right testicle, severe pain laying down in bed at night. Does not know what to do He has an appointment with spine clinic at [facility] in 2 weeks' time. Does not think he can wait that long,  contemplating going to emergency room today. Reports having CT scan done at hospital in [place] of his right hip   Echo 05/2016 reviewed with him in detail Dilated left ventricle and left atrium remaining chambers are normal in size Anteroapical akinesis and moderate hypokinesis of the lateral wall normal wall motion of the inferior wall overall LV ejection fraction calculated at 30 to 35% Valves appear structurally normal mild mitral regurgitation Pacing wire right atrium/right ventricle  Denies any significant symptoms concerning for angina Limited in his ability to exert himself secondary to hip pain No regular exercise program  EKG on today's visit shows normal sinus rhythm with rate 64 bpm, old anterior MI, no change from prior EKG  Other past medical history reviewed Previous nuclear stress testing on 03/06/2015 that showed hypertensive response to exercise with a large fixed defect of severe severity present in the mid anterior, mid anteroseptal, apical anterior, apical inferior and apex location with no significant reversibility, findings were consistent with prior MI. EF 27%.    presented to [facility] ED following the stress test with complaints of sudden onset of  intermittent chest pain, SOB, and lightheadedness. Troponin was negative . CXR showed no active cardiopulmonary disease. He was found to be hypokalemic at 3.3. But episode of syncope 05/29/2014. He got up overnight to go to the bathroom, after urination he felt very lightheaded, had syncope, hit his head. He went to the hospital and was evaluated.  Notes from the hospital were reviewed including the testing, lab work and consultation notes Seen by cardiology It  was felt he had vasovagal syncope. ICD was interrogated showing no arrhythmia Carotid ultrasound showed only mild to moderate carotid arterial disease bilaterally Echocardiogram was essentially unchanged with ejection fraction 25-30%  May 2013, he had  ventricular tachycardia while in the emergency room while at work. He was diaphoretic, required resuscitation. He was transferred to [facility] and had a cardiac catheterization with no PCI. Chronically occluded LAD, moderately diseased RCA. He had defibrillator placed 12/01/2011 presumably given his large anterior scar and the VT requiring defibrillation  Emergency room visit 08/17/2012 for chest pain. Cardiac enzymes negative. Discharged home 1 previous episode of nausea, lightheadedness, near syncope. run of nonsustained VT on 11/06/2012, did not correlate with symptoms  MRI done of his heart in February 2008 showed anterior and anterolateral region was nonviable. Ejection fraction by catheterization at that time was 35%.  hospital admission 06/28/2013 for syncope, malaise, chest pain. He had a fight with his separated wife, at that time had syncope. He presented to the hospital, cardiac enzymes were negative but considering is frequent episodes of chest pain, cardiac catheterization was performed. Cardiac cath on 06/29/2013 done by radial access showed 70% proximal RCA disease with DES placed. FFR was done to confirm stenosis. He had a patent stent in the left circumflex, chronically occluded LAD, ejection fraction 20%  previous lab work in 2014 showing Total cholesterol 107, LDL 50, HDL 33   PMH:   has a past medical history of Coronary artery disease; HLD (hyperlipidemia); HTN (hypertension); ICD  single,BSX; Ischemic cardiomyopathy; Syncope and collapse; and Ventricular tachycardia ([place]) (11/2011).  PSH:    Past Surgical History:  Procedure Laterality Date  . appendectomy    . CARDIAC CATHETERIZATION  Nov 30 2011   [facility]  . CARDIAC DEFIBRILLATOR PLACEMENT  May 2013  . CORONARY ANGIOPLASTY  2014   x 2 stents  . CORONARY STENT PLACEMENT     x7  . PROSTATE SURGERY    . TUMOR REMOVAL     chest    Current Outpatient Prescriptions  Medication Sig Dispense Refill  . amiodarone  (PACERONE) 200 MG tablet Take 1 tablet (200 mg total) by mouth 2 (two) times daily as needed. 60 tablet 3  . aspirin 81 MG EC tablet Take 1 tablet (81 mg total) by mouth daily. 30 tablet 6  . atorvastatin (LIPITOR) 80 MG tablet TAKE 1 TABLET BY MOUTH EVERY DAY 30 tablet 3  . carvedilol (COREG) 3.125 MG tablet TAKE 1 TABLET BY MOUTH TWICE DAILY 180 tablet 3  . clopidogrel (PLAVIX) 75 MG tablet TAKE 1 TABLET BY MOUTH EVERY DAY 30 tablet 6  . furosemide (LASIX) 40 MG tablet TAKE 1 TABLET BY MOUTH EVERY DAY 90 tablet 3  . KLOR-CON 10 10 MEQ tablet take 1 tablet by mouth once daily 90 tablet 3  . nitroGLYCERIN (NITROSTAT) 0.4 MG SL tablet Place 1 tablet (0.4 mg total) under the tongue every 5 (five) minutes as needed for chest pain. 25 tablet 3  . sacubitril-valsartan (ENTRESTO) 49-51 MG Take 1 tablet by mouth 2 (two) times daily. 60 tablet 3  . tadalafil (CIALIS) 10 MG tablet Take 1 tablet (10 mg total) by mouth daily as needed for erectile dysfunction (take 30 min prior to when needed.). 10 tablet 3  . ZETIA 10 MG tablet TAKE 1 TABLET BY MOUTH DAILY 30 tablet 6  . potassium chloride (K-DUR,KLOR-CON) 10 MEQ tablet Take 1.5 tablets (15 mEq total) by mouth daily. 240 tablet 3   No current facility-administered medications  for this visit.      Allergies:   Nitroglycerin   Social History:  The patient  reports that he quit smoking about 4 years ago. His smoking use included Cigarettes. He has a 7.00 pack-year smoking history. He has never used smokeless tobacco. He reports that he drinks about 7.2 oz of alcohol per week . He reports that he does not use drugs.   Family History:   Family history is unknown by patient.    Review of Systems: Review of Systems  Constitutional: Negative.   Respiratory: Positive for shortness of breath.   Cardiovascular: Positive for palpitations.  Gastrointestinal: Negative.   Musculoskeletal: Positive for back pain and joint pain.  Neurological: Negative.    Psychiatric/Behavioral: Negative.   All other systems reviewed and are negative.    PHYSICAL EXAM: VS:  BP 128/76 (BP Location: Left Arm, Patient Position: Sitting, Cuff Size: Normal)   Pulse 64   Ht 5\' 8"  (1.727 m)   Wt 182 lb 8 oz (82.8 kg)   BMI 27.75 kg/m  , BMI Body mass index is 27.75 kg/m. GEN: Well nourished, well developed, in mild distress, is in moderate distress, does not want to sit on exam table secondary to back pain HEENT: normal  Neck: no JVD, carotid bruits, or masses Cardiac: RRR; no murmurs, rubs, or gallops,no edema  Respiratory:  clear to auscultation bilaterally, normal work of breathing GI: soft, nontender, nondistended, + BS MS: no deformity or atrophy  Skin: warm and dry, no rash Neuro:  Strength and sensation are intact Psych: euthymic mood, full affect    Recent Labs: No results found for requested labs within last 8760 hours.    Lipid Panel Lab Results  Component Value Date   CHOL 111 01/20/2014   HDL 39 (L) 01/20/2014   LDLCALC 54 01/20/2014   TRIG 88 01/20/2014      Wt Readings from Last 3 Encounters:  06/27/16 182 lb 8 oz (82.8 kg)  05/13/16 182 lb (82.6 kg)  03/12/16 177 lb 12 oz (80.6 kg)       ASSESSMENT AND PLAN:  Coronary artery disease involving native coronary artery of native heart without angina pectoris - Plan: EKG 12-Lead, CT LUMBAR SPINE WO CONTRAST Recent hospital admission shortness of breath, tachycardia, troponin 0.6 Records have been requested ICD download this evening to rule out sustained VT Currently without anginal symptoms Likely demand ischemia in the setting of arrhythmia, possibly the acute on chronic systolic CHF  Spinal stenosis of lumbar region, unspecified whether neurogenic claudication present - Plan: CT LUMBAR SPINE WO CONTRAST CT scan lumbar spine ordered given the severity of his pain He has follow-up with [facility] in 10 days or so  Ventricular tachycardia ([place]) We have called  [facility] [place], will arrange for ICD download this evening given sensation of tachycardia prior to onset of shortness of breath. No shock delivered but did lead to hospital admission for acute shortness of breath If he does have sustained VT, may need to load with amiodarone  Ischemic cardiomyopathy Ejection fraction 30-35%, For any further episodes of sustained VT, may need ischemia workup  Mixed hyperlipidemia We will arrange for repeat lipid panel in follow-up  Single implantable cardioverter-defibrillator-BSx  Shortness of breath  Acute on chronic systolic CHF (congestive heart failure) ([place]) We have recommended he increase Lasix up to 40 mg twice a day 3 days per week, otherwise 40 mg daily, will increase his potassium as well 1-1/2 pills per day    Total  encounter time more than 45 minutes  Greater than 50% was spent in counseling and coordination of care with the patient  Disposition:   F/U  6 months   Orders Placed This Encounter  Procedures  . CT LUMBAR SPINE WO CONTRAST  . EKG 12-Lead     Signed, Esmond Plants, M.D., Ph.D. 06/27/2016  [facility], [place]

## 2016-06-27 NOTE — Patient Instructions (Addendum)
Medication Instructions:   Please take Lasix 40 mg twice daily on Monday, Wednesday, Friday Please take 1 & 1/2 of your potassium pills every day (15 meq each day)  Labwork:  No new labs needed  Testing/Procedures:  We will arrange a home ICD download tonight for possible VT Please perform a manual transmission tonight @ 8pm Please call Pamala Hurry in the device clinic if you need further instructions [phone])  Lumbar CT Monday, Dec 4 @ 4:30 Please arrive @ the [name] @ 4:[address], [place] [phone]  I recommend watching educational videos on topics of interest to you at:       [email or web address]  Enter code: HEARTCARE    Follow-Up: It was a pleasure seeing you in the office today. Please call Ian Mills if you have new issues that need to be addressed before your next appt.  [phone]  Your physician wants you to follow-up in: 6 months.  You will receive a reminder letter in the mail two months in advance. If you don't receive a letter, please call our office to schedule the follow-up appointment.  If you need a refill on your cardiac medications before your next appointment, please call your pharmacy.  Spinal Stenosis Spinal stenosis is when the open spaces between the bones of your spine (vertebrae) get smaller (narrow). It is caused by bone pushing on the open spaces of your spine. This puts pressure on your spine and the nerves in your spine. You may be given medicine to lessen the puffiness (inflammation) in your nerves. Other times, surgery is needed. HOME CARE  Change positions when you sit, stand, and lie. This can help take pressure off your nerves.  Do exercises as told by your doctor to strengthen the middle part of your body.  Lose weight if your doctor suggests it. This takes pressure off your spine.  Take all medicine as told by your doctor. MAKE SURE YOU:  Understand these instructions.  Will watch your  condition.  Will get help right away if you are not doing well or get worse. This information is not intended to replace advice given to you by your health care provider. Make sure you discuss any questions you have with your health care provider. Document Released: 11/08/2010 Document Revised: 03/17/2013 Document Reviewed: 10/23/2012 Elsevier Interactive Patient Education  2017 [place] A computed tomography (CT) scan is a specialized X-ray scan. It uses X-rays and a computer to make pictures of different areas of your body. A CT scan can offer more detailed information than a regular X-ray exam. The CT scan provides data about internal organs, soft tissue structures, blood vessels, and bones.  The CT scanner is a large machine that takes pictures of your body as you move through the opening.  LET [facility] CARE PROVIDER KNOW ABOUT:  Any allergies you have.   All medicines you are taking, including vitamins, herbs, eye drops, creams, and over-the-counter medicines.   Previous problems you or members of your family have had with the use of anesthetics.   Any blood disorders you have.   Previous surgeries you have had.   Medical conditions you have. RISKS AND COMPLICATIONS  Generally, this is a safe procedure. However, as with any procedure, problems can occur. Possible problems include:   An allergic reaction to the contrast material.   Development of cancer from excessive exposure to radiation. The risk of this is small.  BEFORE THE PROCEDURE   The  day before the test, stop drinking caffeinated beverages. These include energy drinks, tea, soda, coffee, and hot chocolate.   On the day of the test:  About 4 hours before the test, stop eating and drinking anything but water as advised by your health care provider.   Avoid wearing jewelry. You will have to partly or fully undress and wear a hospital gown. PROCEDURE   You will be asked to lie on a table  with your arms above your head.   If contrast dye is to be used for the test, an IV tube will be inserted in your arm. The contrast dye will be injected into the IV tube. You might feel warm, or you may get a metallic taste in your mouth.   The table you will be lying on will move into a large machine that will do the scanning.   You will be able to see, hear, and talk to the person running the machine while you are in it. Follow that person's directions.   The CT machine will move around you to take pictures. Do not move while it is scanning. This helps to get a good image.   When the best possible pictures have been taken, the machine will be turned off. The table will be moved out of the machine. The IV tube will then be removed. AFTER THE PROCEDURE  Ask your health care provider when to follow up for your test results. This information is not intended to replace advice given to you by your health care provider. Make sure you discuss any questions you have with your health care provider. Document Released: 08/22/2004 Document Revised: 07/20/2013 Document Reviewed: 03/22/2013 Elsevier Interactive Patient Education  2017 [employer].

## 2016-06-28 ENCOUNTER — Telehealth: Payer: Self-pay | Admitting: Cardiovascular Disease

## 2016-06-28 ENCOUNTER — Ambulatory Visit (INDEPENDENT_AMBULATORY_CARE_PROVIDER_SITE_OTHER): Payer: 59 | Admitting: *Deleted

## 2016-06-28 DIAGNOSIS — Z95810 Presence of automatic (implantable) cardiac defibrillator: Secondary | ICD-10-CM

## 2016-06-28 NOTE — Progress Notes (Signed)
Remote ICD transmission.  Add on per Dr. Arta Bruce. Faxed report to [place] office. He was looking for episodes.

## 2016-06-28 NOTE — Telephone Encounter (Signed)
Pt states he did a device check last night and would like to know what the results are. Pamala Hurry in the device clinic asked pt to call us to get results.

## 2016-06-28 NOTE — Telephone Encounter (Signed)
Spoke w/ pt.  Reviewed device upload results w/ him He verbalizes understanding and is appreciative of the call.

## 2016-07-01 ENCOUNTER — Ambulatory Visit
Admission: RE | Admit: 2016-07-01 | Discharge: 2016-07-01 | Disposition: A | Discharge status: Home / Self Care | Payer: 59 | Source: Ambulatory Visit | Attending: Cardiovascular Disease | Admitting: Cardiovascular Disease

## 2016-07-01 DIAGNOSIS — M5126 Other intervertebral disc displacement, lumbar region: Secondary | ICD-10-CM | POA: Diagnosis not present

## 2016-07-01 DIAGNOSIS — M48061 Spinal stenosis, lumbar region without neurogenic claudication: Secondary | ICD-10-CM | POA: Diagnosis not present

## 2016-07-01 DIAGNOSIS — I2510 Atherosclerotic heart disease of native coronary artery without angina pectoris: Secondary | ICD-10-CM | POA: Diagnosis not present

## 2016-07-01 DIAGNOSIS — M4186 Other forms of scoliosis, lumbar region: Secondary | ICD-10-CM | POA: Insufficient documentation

## 2016-07-01 DIAGNOSIS — I700 Atherosclerosis of aorta: Secondary | ICD-10-CM | POA: Insufficient documentation

## 2016-07-01 DIAGNOSIS — M47816 Spondylosis without myelopathy or radiculopathy, lumbar region: Secondary | ICD-10-CM | POA: Diagnosis not present

## 2016-07-06 LAB — CUP PACEART REMOTE DEVICE CHECK
Brady Statistic RV Percent Paced: 0 %
Date Time Interrogation Session: 20171116211500
HighPow Impedance: 68 Ohm
Implantable Lead Serial Number: 112568
Lead Channel Impedance Value: 505 Ohm
Lead Channel Pacing Threshold Amplitude: 0.9 V
Lead Channel Pacing Threshold Pulse Width: 0.5 ms
MDC IDC LEAD IMPLANT DT: 20130503
MDC IDC LEAD LOCATION: 753860
MDC IDC LEAD MODEL: 292
MDC IDC MSMT BATTERY REMAINING LONGEVITY: 108 mo
MDC IDC MSMT BATTERY REMAINING PERCENTAGE: 100 %
MDC IDC PG IMPLANT DT: 20130503
MDC IDC SET LEADCHNL RV PACING AMPLITUDE: 2.4 V
MDC IDC SET LEADCHNL RV PACING PULSEWIDTH: 0.5 ms
MDC IDC SET LEADCHNL RV SENSING SENSITIVITY: 0.6 mV
Pulse Gen Serial Number: 105232

## 2016-07-12 ENCOUNTER — Other Ambulatory Visit: Payer: Self-pay | Admitting: Cardiovascular Disease

## 2016-07-31 DIAGNOSIS — M533 Sacrococcygeal disorders, not elsewhere classified: Secondary | ICD-10-CM | POA: Diagnosis not present

## 2016-07-31 DIAGNOSIS — M5136 Other intervertebral disc degeneration, lumbar region: Secondary | ICD-10-CM | POA: Diagnosis not present

## 2016-07-31 HISTORY — DX: Sacrococcygeal disorders, not elsewhere classified: M53.3

## 2016-08-01 ENCOUNTER — Other Ambulatory Visit: Payer: Self-pay | Admitting: *Deleted

## 2016-08-01 ENCOUNTER — Telehealth: Payer: Self-pay | Admitting: Cardiovascular Disease

## 2016-08-01 LAB — CUP PACEART REMOTE DEVICE CHECK
Brady Statistic RV Percent Paced: 0 %
Date Time Interrogation Session: 20180104095353
HIGH POWER IMPEDANCE MEASURED VALUE: 80 Ohm
Implantable Lead Location: 753860
Implantable Lead Serial Number: 112568
Lead Channel Impedance Value: 583 Ohm
Lead Channel Setting Sensing Sensitivity: 0.6 mV
MDC IDC LEAD IMPLANT DT: 20130503
MDC IDC LEAD MODEL: 292
MDC IDC MSMT LEADCHNL RV SENSING INTR AMPL: 15.7 mV
MDC IDC PG IMPLANT DT: 20130503
MDC IDC SET LEADCHNL RV PACING AMPLITUDE: 2.4 V
MDC IDC SET LEADCHNL RV PACING PULSEWIDTH: 0.5 ms
Pulse Gen Serial Number: 105232

## 2016-08-01 MED ORDER — FUROSEMIDE 40 MG PO TABS: ORAL_TABLET | ORAL | 3 refills | Status: DC

## 2016-08-01 NOTE — Telephone Encounter (Signed)
Requested Prescriptions   Signed Prescriptions Disp Refills  . furosemide (LASIX) 40 MG tablet 72 tablet 3    Sig: Take 40 mg tablet by mouth twice daily Monday, Wednesday and Friday.    Authorizing Provider: Minna Merritts    Ordering User: Britt Bottom

## 2016-08-01 NOTE — Telephone Encounter (Signed)
Pharmacy calling asking about patient Lasik prescription Pt was told to take more than one tablet a day They just need [place] to send in a new prescription with that on it Please advise

## 2016-08-05 IMAGING — CR DG CHEST 1V PORT
1 series · 1 of 1 positions shown · non-contrast
Comparison: 06/28/2013.

CLINICAL DATA: Hypertension.  Defibrillation.

EXAM:
PORTABLE CHEST - 1 VIEW

[ap]
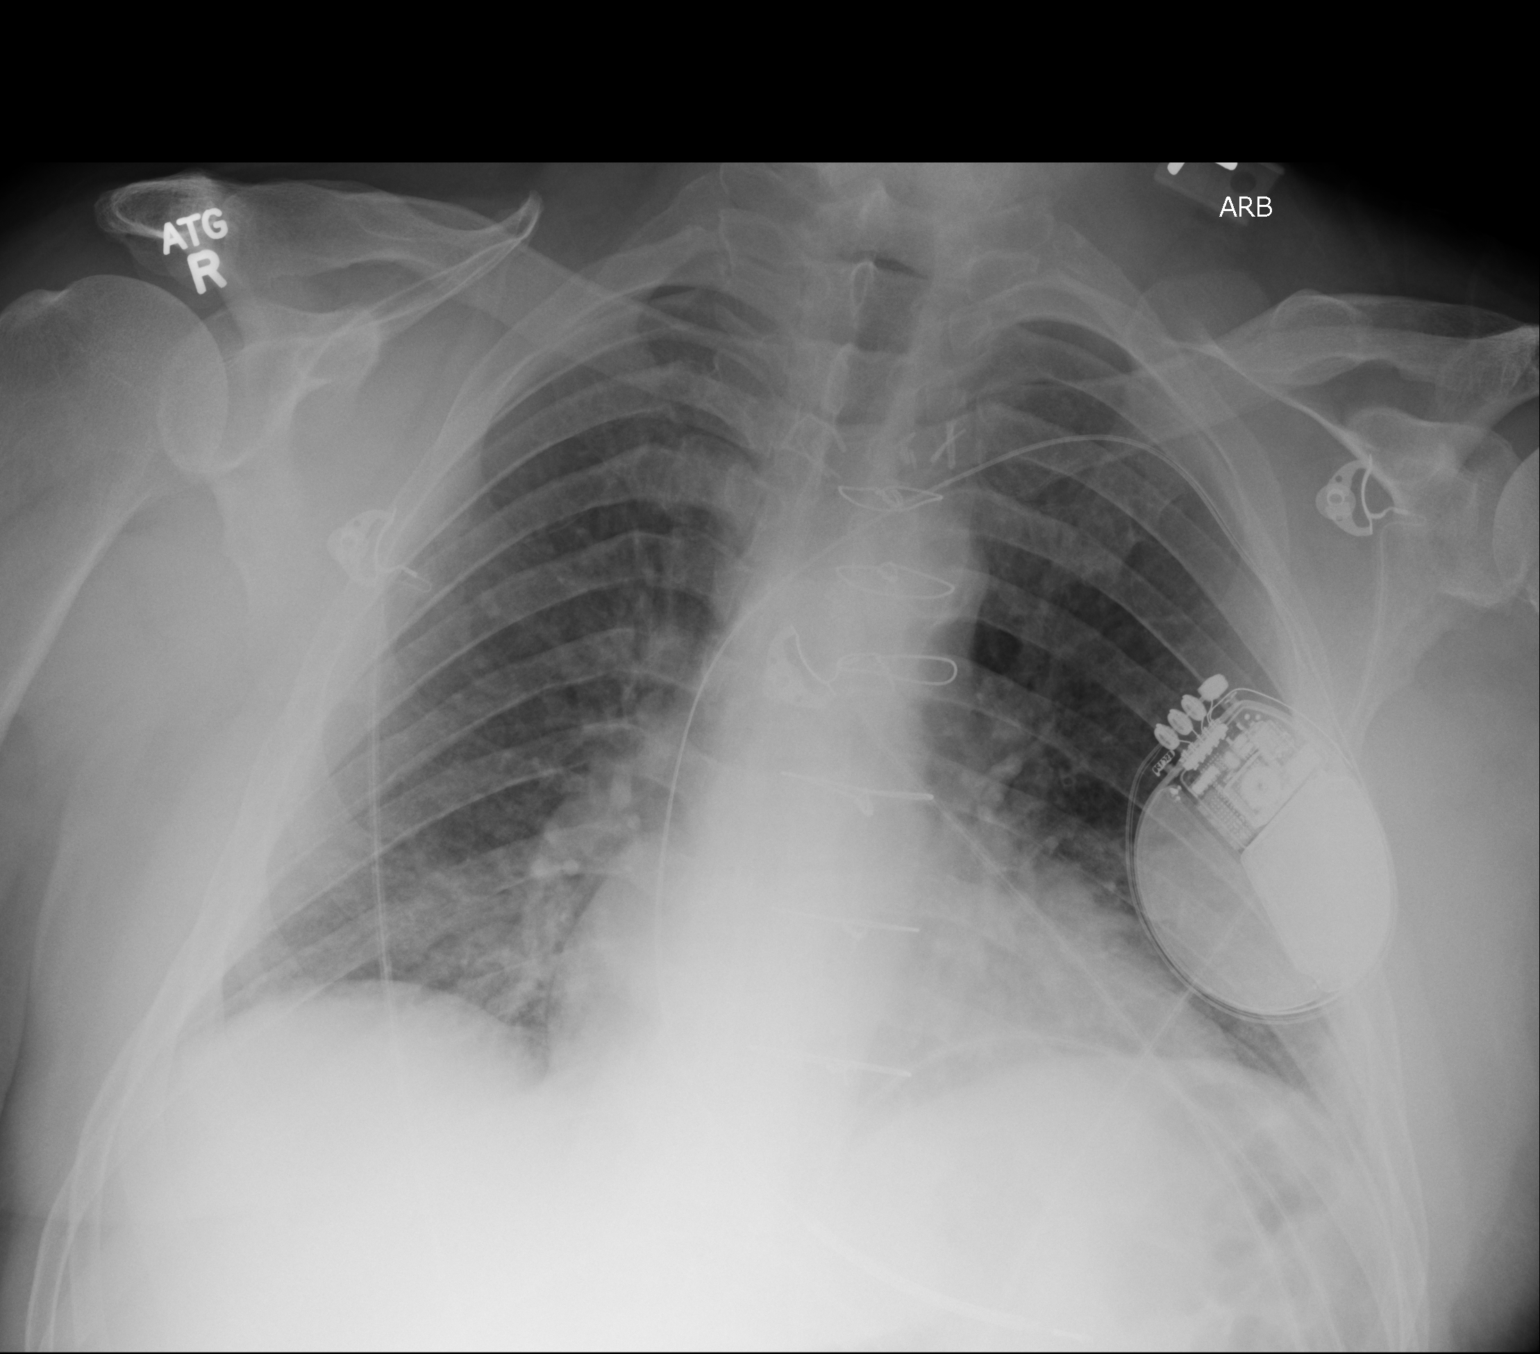

[1 of 1 positions shown; findings below may reference images not displayed]

FINDINGS: Mediastinum and hilar structures are normal. Prior CABG. Cardiac
pacer with lead tip in right ventricle. Mild cardiomegaly with
pulmonary vascular prominence and interstitial prominence. These
findings suggest mild congestive heart failure. No pneumothorax. Old
left clavicular fracture.
IMPRESSION: 1. Prior CABG.  Cardiac pacer noted lead tip over right ventricle.
2. Findings consistent with mild congestive heart failure with
interstitial edema.

## 2016-08-06 DIAGNOSIS — M533 Sacrococcygeal disorders, not elsewhere classified: Secondary | ICD-10-CM | POA: Diagnosis not present

## 2016-08-06 DIAGNOSIS — M461 Sacroiliitis, not elsewhere classified: Secondary | ICD-10-CM | POA: Diagnosis not present

## 2016-08-27 DIAGNOSIS — Z7982 Long term (current) use of aspirin: Secondary | ICD-10-CM | POA: Diagnosis not present

## 2016-08-27 DIAGNOSIS — F329 Major depressive disorder, single episode, unspecified: Secondary | ICD-10-CM | POA: Diagnosis not present

## 2016-08-27 DIAGNOSIS — Z79899 Other long term (current) drug therapy: Secondary | ICD-10-CM | POA: Diagnosis not present

## 2016-09-12 ENCOUNTER — Ambulatory Visit (INDEPENDENT_AMBULATORY_CARE_PROVIDER_SITE_OTHER): Payer: 59 | Admitting: *Deleted

## 2016-09-12 DIAGNOSIS — I472 Ventricular tachycardia, unspecified: Secondary | ICD-10-CM

## 2016-09-12 NOTE — Progress Notes (Signed)
Remote ICD transmission.   

## 2016-09-17 ENCOUNTER — Encounter: Payer: Self-pay | Admitting: Cardiology

## 2016-09-18 ENCOUNTER — Telehealth: Payer: Self-pay | Admitting: Cardiovascular Disease

## 2016-09-18 NOTE — Telephone Encounter (Signed)
Attempted to contact patient but there was no answer.  I have placed 3 boxes samples for Entresto 49mg /51mg   Lot # [id number]  Expiration date: 10/18

## 2016-09-18 NOTE — Telephone Encounter (Signed)
Patient calling the office for samples of medication:   1.  What medication and dosage are you requesting samples for?  Xarelto 49-51   2.  Are you currently out of this medication?  Yes

## 2016-09-20 LAB — CUP PACEART REMOTE DEVICE CHECK
Battery Remaining Longevity: 102 mo
Brady Statistic RV Percent Paced: 0 %
HIGH POWER IMPEDANCE MEASURED VALUE: 81 Ohm
Implantable Lead Implant Date: 20130503
Implantable Lead Location: 753860
Implantable Lead Model: 292
Lead Channel Setting Pacing Pulse Width: 0.5 ms
Lead Channel Setting Sensing Sensitivity: 0.6 mV
MDC IDC LEAD SERIAL: 112568
MDC IDC MSMT BATTERY REMAINING PERCENTAGE: 100 %
MDC IDC MSMT LEADCHNL RV IMPEDANCE VALUE: 544 Ohm
MDC IDC MSMT LEADCHNL RV PACING THRESHOLD AMPLITUDE: 0.9 V
MDC IDC MSMT LEADCHNL RV PACING THRESHOLD PULSEWIDTH: 0.5 ms
MDC IDC PG IMPLANT DT: 20130503
MDC IDC PG SERIAL: 105232
MDC IDC SESS DTM: 20180215072100
MDC IDC SET LEADCHNL RV PACING AMPLITUDE: 2.4 V

## 2016-10-16 ENCOUNTER — Telehealth: Payer: Self-pay | Admitting: Cardiovascular Disease

## 2016-10-16 NOTE — Telephone Encounter (Signed)
Patient calling the office for samples of medication:   1.  What medication and dosage are you requesting samples for?  Cialis 10 mg po daily prn  2.  Are you currently out of this medication?  Yes   Waiting in office

## 2016-10-16 NOTE — Telephone Encounter (Signed)
Pt is aware that we don't have any Cialis samples available at the time.
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
## 2016-12-09 ENCOUNTER — Ambulatory Visit (INDEPENDENT_AMBULATORY_CARE_PROVIDER_SITE_OTHER): Payer: 59 | Admitting: Cardiovascular Disease

## 2016-12-09 ENCOUNTER — Encounter: Payer: Self-pay | Admitting: Cardiovascular Disease

## 2016-12-09 VITALS — BP 152/78 | HR 68 | Ht 69.0 in | Wt 192.2 lb

## 2016-12-09 DIAGNOSIS — I472 Ventricular tachycardia, unspecified: Secondary | ICD-10-CM

## 2016-12-09 DIAGNOSIS — I25118 Atherosclerotic heart disease of native coronary artery with other forms of angina pectoris: Secondary | ICD-10-CM | POA: Diagnosis not present

## 2016-12-09 DIAGNOSIS — I5023 Acute on chronic systolic (congestive) heart failure: Secondary | ICD-10-CM | POA: Diagnosis not present

## 2016-12-09 DIAGNOSIS — I255 Ischemic cardiomyopathy: Secondary | ICD-10-CM | POA: Diagnosis not present

## 2016-12-09 DIAGNOSIS — Z95810 Presence of automatic (implantable) cardiac defibrillator: Secondary | ICD-10-CM | POA: Diagnosis not present

## 2016-12-09 DIAGNOSIS — I209 Angina pectoris, unspecified: Secondary | ICD-10-CM

## 2016-12-09 DIAGNOSIS — E782 Mixed hyperlipidemia: Secondary | ICD-10-CM | POA: Diagnosis not present

## 2016-12-09 DIAGNOSIS — F17200 Nicotine dependence, unspecified, uncomplicated: Secondary | ICD-10-CM | POA: Diagnosis not present

## 2016-12-09 MED ORDER — SACUBITRIL-VALSARTAN 97-103 MG PO TABS: 1.0000 | ORAL_TABLET | Freq: Two times a day (BID) | ORAL | 6 refills | Status: DC

## 2016-12-09 NOTE — Patient Instructions (Addendum)
Medication Instructions:   Please increase the entresto up to 97-103 mg twice a day  Labwork:  We will check liver, lipids, BMP today  Testing/Procedures:  No further testing at this time   I recommend watching educational videos on topics of interest to you at:       [email or web address]  Enter code: HEARTCARE    Follow-Up: It was a pleasure seeing you in the office today. Please call us if you have new issues that need to be addressed before your next appt.  [phone]  Your physician wants you to follow-up in: 6 months.  You will receive a reminder letter in the mail two months in advance. If you don't receive a letter, please call our office to schedule the follow-up appointment.  If you need a refill on your cardiac medications before your next appointment, please call your pharmacy.

## 2016-12-10 LAB — BASIC METABOLIC PANEL
BUN/Creatinine Ratio: 10 (ref 10–24)
BUN: 11 mg/dL (ref 8–27)
CO2: 21 mmol/L (ref 18–29)
CREATININE: 1.05 mg/dL (ref 0.76–1.27)
Calcium: 8.6 mg/dL (ref 8.6–10.2)
Chloride: 103 mmol/L (ref 96–106)
GFR calc Af Amer: 87 mL/min/{1.73_m2} (ref >59)
GFR, EST NON AFRICAN AMERICAN: 75 mL/min/{1.73_m2} (ref >59)
Glucose: 108 mg/dL — ABNORMAL HIGH (ref 65–99)
Potassium: 3.9 mmol/L (ref 3.5–5.2)
SODIUM: 142 mmol/L (ref 134–144)

## 2016-12-10 LAB — LIPID PANEL
CHOLESTEROL TOTAL: 170 mg/dL (ref 100–199)
Chol/HDL Ratio: 3.2 ratio (ref 0.0–5.0)
HDL: 53 mg/dL (ref >39)
LDL Calculated: 94 mg/dL (ref 0–99)
Triglycerides: 113 mg/dL (ref 0–149)
VLDL Cholesterol Cal: 23 mg/dL (ref 5–40)

## 2016-12-10 LAB — HEPATIC FUNCTION PANEL
ALT: 16 IU/L (ref 0–44)
AST: 15 IU/L (ref 0–40)
Albumin: 4 g/dL (ref 3.6–4.8)
Alkaline Phosphatase: 101 IU/L (ref 39–117)
BILIRUBIN, DIRECT: 0.1 mg/dL (ref 0.00–0.40)
Bilirubin Total: 0.3 mg/dL (ref 0.0–1.2)
Total Protein: 6.6 g/dL (ref 6.0–8.5)

## 2016-12-12 ENCOUNTER — Telehealth: Payer: Self-pay | Admitting: Cardiology

## 2016-12-12 ENCOUNTER — Ambulatory Visit (INDEPENDENT_AMBULATORY_CARE_PROVIDER_SITE_OTHER): Payer: 59 | Admitting: *Deleted

## 2016-12-12 DIAGNOSIS — I255 Ischemic cardiomyopathy: Secondary | ICD-10-CM

## 2016-12-12 NOTE — Telephone Encounter (Signed)
Spoke with pt and reminded pt of remote transmission that is due today. Pt verbalized understanding.   

## 2016-12-13 ENCOUNTER — Encounter: Payer: Self-pay | Admitting: Cardiology

## 2016-12-13 NOTE — Progress Notes (Signed)
Remote ICD transmission.   

## 2016-12-19 LAB — CUP PACEART REMOTE DEVICE CHECK
Battery Remaining Longevity: 102 mo
Battery Remaining Percentage: 100 %
Date Time Interrogation Session: 20180518124900
HighPow Impedance: 82 Ohm
Implantable Lead Implant Date: 20130503
Implantable Lead Location: 753860
Implantable Lead Serial Number: 112568
Implantable Pulse Generator Implant Date: 20130503
Lead Channel Pacing Threshold Pulse Width: 0.5 ms
Lead Channel Setting Pacing Amplitude: 2.4 V
Lead Channel Setting Sensing Sensitivity: 0.6 mV
MDC IDC MSMT LEADCHNL RV IMPEDANCE VALUE: 597 Ohm
MDC IDC MSMT LEADCHNL RV PACING THRESHOLD AMPLITUDE: 0.9 V
MDC IDC PG SERIAL: 105232
MDC IDC SET LEADCHNL RV PACING PULSEWIDTH: 0.5 ms
MDC IDC STAT BRADY RV PERCENT PACED: 0 %

## 2016-12-20 ENCOUNTER — Telehealth: Payer: Self-pay | Admitting: Cardiovascular Disease

## 2016-12-20 NOTE — Telephone Encounter (Signed)
No answer/Voicemail box is full.  

## 2016-12-20 NOTE — Telephone Encounter (Signed)
Pt retuning our call from lab results   Please call back

## 2016-12-24 NOTE — Telephone Encounter (Signed)
Returned call to patient and gave him the results as follows from lab work on 12/09/16:   Notes recorded by Minna Merritts, MD on 12/15/2016 at 8:25 PM EDT Total chol and LDL mildly elevated Would make sure he does not miss any doses of his lipitor and zetia   Patient states he has been taking his lipitor and zetia as prescribed. Advised patient I will let Dr Rockey Situ know and we will let him know if Dr Rockey Situ makes any further recommendations.  Patient would also like a refill on his Cialis. Routing to Dr Rockey Situ.

## 2016-12-24 NOTE — Telephone Encounter (Signed)
Pt is returning the call from 5/25

## 2016-12-29 NOTE — Telephone Encounter (Signed)
Okay to refill Cialis In terms of his cholesterol level, would recommend low carbohydrate diet

## 2016-12-30 MED ORDER — TADALAFIL 10 MG PO TABS
10.0000 mg | ORAL_TABLET | Freq: Every day | ORAL | 3 refills | Status: DC | PRN
Start: 2016-12-30 — End: 2020-06-29

## 2017-01-07 ENCOUNTER — Telehealth: Payer: Self-pay | Admitting: Cardiovascular Disease

## 2017-01-07 NOTE — Telephone Encounter (Signed)
Left message on machine for patient to contact the office.   

## 2017-01-07 NOTE — Telephone Encounter (Signed)
Patient calling the office for samples of medication:   1.  What medication and dosage are you requesting samples for? Entresto 97/103 mg twice a day  2.  Are you currently out of this medication? Yes Last taken on Sunday 01/05/17.  Patient complains of headache.  Please call when samples are ready.

## 2017-01-07 NOTE — Telephone Encounter (Signed)
Pt reports he is out of Entresto 97/103mg . Last dose was June 10 and he is unable to pick up prescription at the pharmacy until he gets paid next week.  States his copay is "high" but he does not remember the amount. States he applied for patient assistance but makes $200 too much to qualify. Pt will pick up 49/51mg  samples. He understands to take 2 tablets twice daily in order to get the full dose prescribed.  Medication Samples have been provided to the patient.  Drug name: Delene Loll       Strength: 49/61mg         Qty: 4 bottles LOT: [id number]  Exp.Date: 10/19  Dosing instructions: 2 tablets twice daily  The patient has been instructed regarding the correct time, dose, and frequency of taking this medication, including desired effects and most common side effects.   Georgiana Shore 10:48 AM 01/07/2017   Pt authorizes [place] to speak to his pharmacy. S/w Lennette Bihari at [place], [employer]  Pt last refilled entresto in Feb 2018. Cost $60/month.

## 2017-01-17 ENCOUNTER — Other Ambulatory Visit: Payer: Self-pay | Admitting: Cardiovascular Disease

## 2017-02-01 ENCOUNTER — Other Ambulatory Visit: Payer: Self-pay | Admitting: Cardiovascular Disease

## 2017-02-10 ENCOUNTER — Other Ambulatory Visit: Payer: Self-pay | Admitting: Cardiovascular Disease

## 2017-02-19 DIAGNOSIS — F321 Major depressive disorder, single episode, moderate: Secondary | ICD-10-CM | POA: Diagnosis not present

## 2017-02-19 DIAGNOSIS — G4709 Other insomnia: Secondary | ICD-10-CM | POA: Diagnosis not present

## 2017-02-19 DIAGNOSIS — I5022 Chronic systolic (congestive) heart failure: Secondary | ICD-10-CM | POA: Diagnosis not present

## 2017-03-13 ENCOUNTER — Ambulatory Visit (INDEPENDENT_AMBULATORY_CARE_PROVIDER_SITE_OTHER): Payer: 59 | Admitting: *Deleted

## 2017-03-13 DIAGNOSIS — I255 Ischemic cardiomyopathy: Secondary | ICD-10-CM | POA: Diagnosis not present

## 2017-03-13 DIAGNOSIS — I472 Ventricular tachycardia, unspecified: Secondary | ICD-10-CM

## 2017-03-14 NOTE — Progress Notes (Signed)
Remote defibrillator check.  

## 2017-03-21 LAB — CUP PACEART REMOTE DEVICE CHECK
Battery Remaining Longevity: 96 mo
Battery Remaining Percentage: 100 %
Date Time Interrogation Session: 20180816062100
HIGH POWER IMPEDANCE MEASURED VALUE: 85 Ohm
Implantable Pulse Generator Implant Date: 20130503
Lead Channel Impedance Value: 593 Ohm
Lead Channel Pacing Threshold Pulse Width: 0.5 ms
Lead Channel Setting Pacing Amplitude: 2.4 V
Lead Channel Setting Pacing Pulse Width: 0.5 ms
Lead Channel Setting Sensing Sensitivity: 0.6 mV
MDC IDC LEAD IMPLANT DT: 20130503
MDC IDC LEAD LOCATION: 753860
MDC IDC LEAD SERIAL: 112568
MDC IDC MSMT LEADCHNL RV PACING THRESHOLD AMPLITUDE: 0.9 V
MDC IDC STAT BRADY RV PERCENT PACED: 0 %
Pulse Gen Serial Number: 105232

## 2017-03-28 ENCOUNTER — Encounter: Payer: Self-pay | Admitting: Cardiology

## 2017-04-11 ENCOUNTER — Other Ambulatory Visit: Payer: Self-pay | Admitting: Cardiovascular Disease

## 2017-04-22 ENCOUNTER — Ambulatory Visit (INDEPENDENT_AMBULATORY_CARE_PROVIDER_SITE_OTHER): Payer: 59 | Admitting: Internal Medicine

## 2017-04-22 ENCOUNTER — Encounter: Payer: Self-pay | Admitting: Internal Medicine

## 2017-04-22 VITALS — BP 130/72 | HR 60 | Ht 70.0 in | Wt 188.5 lb

## 2017-04-22 DIAGNOSIS — I255 Ischemic cardiomyopathy: Secondary | ICD-10-CM

## 2017-04-22 DIAGNOSIS — I472 Ventricular tachycardia, unspecified: Secondary | ICD-10-CM

## 2017-04-22 DIAGNOSIS — I2589 Other forms of chronic ischemic heart disease: Secondary | ICD-10-CM

## 2017-04-22 DIAGNOSIS — I5022 Chronic systolic (congestive) heart failure: Secondary | ICD-10-CM

## 2017-04-22 DIAGNOSIS — Z79899 Other long term (current) drug therapy: Secondary | ICD-10-CM

## 2017-04-22 DIAGNOSIS — Z95810 Presence of automatic (implantable) cardiac defibrillator: Secondary | ICD-10-CM

## 2017-04-22 MED ORDER — AMIODARONE HCL 200 MG PO TABS: ORAL_TABLET | ORAL | Status: DC
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
## 2017-04-22 NOTE — Patient Instructions (Signed)
Medication Instructions: - Your physician has recommended you make the following change in your medication:  1) STOP zetia 2) DECREASE amiodarone 200 mg- take 1 & 1/2 tablets (300 mg) by mouth once daily  Labwork: - Your physician recommends that you have lab work today: Liver/ TSH  Procedures/Testing: - none ordered  Follow-Up: - Remote monitoring is used to monitor your Pacemaker of ICD from home. This monitoring reduces the number of office visits required to check your device to one time per year. It allows [place] to keep an eye on the functioning of your device to ensure it is working properly. You are scheduled for a device check from home on 06/12/17. You may send your transmission at any time that day. If you have a wireless device, the transmission will be sent automatically. After your physician reviews your transmission, you will receive a postcard with your next transmission date.  - Your physician wants you to follow-up in: 6 months with Dr. Rockey Situ & 1 year with Dr. Caryl Comes. You will receive a reminder letter in the mail two months in advance. If you don't receive a letter, please call our office to schedule the follow-up appointment.   Any Additional Special Instructions Will Be Listed Below (If Applicable). - You have been given a form for patient assistance for entresto to fill out. Please complete the " Patient Section A" portion of this and return to the office with the "Financial Information" requested.    If you need a refill on your cardiac medications before your next appointment, please call your pharmacy.

## 2017-04-23 LAB — HEPATIC FUNCTION PANEL
ALK PHOS: 107 IU/L (ref 39–117)
ALT: 42 IU/L (ref 0–44)
AST: 26 IU/L (ref 0–40)
Albumin: 4.4 g/dL (ref 3.6–4.8)
Bilirubin Total: 0.4 mg/dL (ref 0.0–1.2)
Bilirubin, Direct: 0.12 mg/dL (ref 0.00–0.40)
Total Protein: 7.2 g/dL (ref 6.0–8.5)

## 2017-04-23 LAB — TSH: TSH: 2.08 u[IU]/mL (ref 0.450–4.500)

## 2017-04-24 ENCOUNTER — Other Ambulatory Visit: Payer: Self-pay

## 2017-04-24 MED ORDER — CARVEDILOL 3.125 MG PO TABS: 3.1250 mg | ORAL_TABLET | Freq: Two times a day (BID) | ORAL | 3 refills | Status: DC

## 2017-04-24 NOTE — Telephone Encounter (Signed)
Requested Prescriptions   Signed Prescriptions Disp Refills  . carvedilol (COREG) 3.125 MG tablet 180 tablet 3    Sig: Take 1 tablet (3.125 mg total) by mouth 2 (two) times daily.    Authorizing Provider: Minna Merritts    Ordering User: Janan Ridge

## 2017-04-25 LAB — CUP PACEART INCLINIC DEVICE CHECK
Brady Statistic RV Percent Paced: 1 % — CL
HIGH POWER IMPEDANCE MEASURED VALUE: 84 Ohm
Implantable Lead Model: 292
Implantable Lead Serial Number: 112568
Lead Channel Impedance Value: 587 Ohm
Lead Channel Pacing Threshold Pulse Width: 0.5 ms
Lead Channel Sensing Intrinsic Amplitude: 15.6 mV
Lead Channel Setting Pacing Amplitude: 2.4 V
MDC IDC LEAD IMPLANT DT: 20130503
MDC IDC LEAD LOCATION: 753860
MDC IDC MSMT LEADCHNL RV PACING THRESHOLD AMPLITUDE: 0.7 V
MDC IDC PG IMPLANT DT: 20130503
MDC IDC SESS DTM: 20180925040000
MDC IDC SET LEADCHNL RV PACING PULSEWIDTH: 0.5 ms
MDC IDC SET LEADCHNL RV SENSING SENSITIVITY: 0.6 mV
Pulse Gen Serial Number: 105232

## 2017-04-28 ENCOUNTER — Telehealth: Payer: Self-pay

## 2017-04-28 NOTE — Telephone Encounter (Signed)
lmtcb for lab results

## 2017-06-12 ENCOUNTER — Ambulatory Visit (INDEPENDENT_AMBULATORY_CARE_PROVIDER_SITE_OTHER): Payer: 59 | Admitting: *Deleted

## 2017-06-12 DIAGNOSIS — I255 Ischemic cardiomyopathy: Secondary | ICD-10-CM | POA: Diagnosis not present

## 2017-06-12 NOTE — Progress Notes (Signed)
Remote ICD transmission.   

## 2017-06-18 ENCOUNTER — Encounter: Payer: Self-pay | Admitting: Cardiology

## 2017-06-27 LAB — CUP PACEART REMOTE DEVICE CHECK
Battery Remaining Percentage: 100 %
Brady Statistic RV Percent Paced: 0 %
HighPow Impedance: 91 Ohm
Implantable Lead Implant Date: 20130503
Implantable Lead Model: 292
Implantable Lead Serial Number: 112568
Lead Channel Setting Sensing Sensitivity: 0.6 mV
MDC IDC LEAD LOCATION: 753860
MDC IDC MSMT BATTERY REMAINING LONGEVITY: 96 mo
MDC IDC MSMT LEADCHNL RV IMPEDANCE VALUE: 577 Ohm
MDC IDC MSMT LEADCHNL RV PACING THRESHOLD AMPLITUDE: 0.7 V
MDC IDC MSMT LEADCHNL RV PACING THRESHOLD PULSEWIDTH: 0.5 ms
MDC IDC PG IMPLANT DT: 20130503
MDC IDC PG SERIAL: 105232
MDC IDC SESS DTM: 20181115072100
MDC IDC SET LEADCHNL RV PACING AMPLITUDE: 2.4 V
MDC IDC SET LEADCHNL RV PACING PULSEWIDTH: 0.5 ms

## 2017-06-28 ENCOUNTER — Other Ambulatory Visit: Payer: Self-pay | Admitting: Cardiovascular Disease

## 2017-06-30 ENCOUNTER — Other Ambulatory Visit: Payer: Self-pay | Admitting: *Deleted

## 2017-06-30 MED ORDER — SACUBITRIL-VALSARTAN 97-103 MG PO TABS: 1.0000 | ORAL_TABLET | Freq: Two times a day (BID) | ORAL | 0 refills | Status: DC

## 2017-07-03 ENCOUNTER — Telehealth: Payer: Self-pay | Admitting: Cardiovascular Disease

## 2017-07-03 NOTE — Telephone Encounter (Signed)
S/w representative. Prior auth for Entresto 97-103 mg by mouth twice a day submitted and approved.  Reference # [id number]. Approved from 07/03/17 to 07/03/2018.

## 2017-07-21 ENCOUNTER — Other Ambulatory Visit: Payer: Self-pay | Admitting: Cardiovascular Disease

## 2017-07-28 ENCOUNTER — Other Ambulatory Visit: Payer: Self-pay | Admitting: Cardiovascular Disease

## 2017-08-03 ENCOUNTER — Other Ambulatory Visit: Payer: Self-pay | Admitting: Cardiovascular Disease

## 2017-08-12 ENCOUNTER — Other Ambulatory Visit: Payer: Self-pay | Admitting: Cardiovascular Disease

## 2017-08-12 NOTE — Telephone Encounter (Signed)
Pt needs f/u appt with G[facility]an. Thanks 

## 2017-09-11 ENCOUNTER — Ambulatory Visit (INDEPENDENT_AMBULATORY_CARE_PROVIDER_SITE_OTHER): Payer: 59 | Admitting: *Deleted

## 2017-09-11 DIAGNOSIS — I255 Ischemic cardiomyopathy: Secondary | ICD-10-CM

## 2017-09-11 NOTE — Progress Notes (Signed)
Remote ICD transmission.   

## 2017-09-17 ENCOUNTER — Encounter: Payer: Self-pay | Admitting: Cardiology

## 2017-09-28 LAB — CUP PACEART REMOTE DEVICE CHECK
Battery Remaining Percentage: 96 %
Date Time Interrogation Session: 20190214072000
HighPow Impedance: 75 Ohm
Implantable Lead Serial Number: 112568
Implantable Pulse Generator Implant Date: 20130503
Lead Channel Impedance Value: 564 Ohm
Lead Channel Pacing Threshold Pulse Width: 0.5 ms
Lead Channel Setting Sensing Sensitivity: 0.6 mV
MDC IDC LEAD IMPLANT DT: 20130503
MDC IDC LEAD LOCATION: 753860
MDC IDC MSMT BATTERY REMAINING LONGEVITY: 90 mo
MDC IDC MSMT LEADCHNL RV PACING THRESHOLD AMPLITUDE: 0.7 V
MDC IDC SET LEADCHNL RV PACING AMPLITUDE: 2.4 V
MDC IDC SET LEADCHNL RV PACING PULSEWIDTH: 0.5 ms
MDC IDC STAT BRADY RV PERCENT PACED: 0 %
Pulse Gen Serial Number: 105232
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
## 2017-10-19 ENCOUNTER — Other Ambulatory Visit: Payer: Self-pay | Admitting: Cardiovascular Disease

## 2017-10-20 ENCOUNTER — Ambulatory Visit (INDEPENDENT_AMBULATORY_CARE_PROVIDER_SITE_OTHER): Payer: 59 | Admitting: Cardiovascular Disease

## 2017-10-20 ENCOUNTER — Encounter: Payer: Self-pay | Admitting: Cardiovascular Disease

## 2017-10-20 VITALS — BP 146/80 | HR 59 | Ht 68.0 in | Wt 207.0 lb

## 2017-10-20 DIAGNOSIS — I472 Ventricular tachycardia, unspecified: Secondary | ICD-10-CM

## 2017-10-20 DIAGNOSIS — I25118 Atherosclerotic heart disease of native coronary artery with other forms of angina pectoris: Secondary | ICD-10-CM

## 2017-10-20 DIAGNOSIS — E782 Mixed hyperlipidemia: Secondary | ICD-10-CM | POA: Diagnosis not present

## 2017-10-20 DIAGNOSIS — Z95810 Presence of automatic (implantable) cardiac defibrillator: Secondary | ICD-10-CM

## 2017-10-20 DIAGNOSIS — I255 Ischemic cardiomyopathy: Secondary | ICD-10-CM

## 2017-10-20 DIAGNOSIS — I5022 Chronic systolic (congestive) heart failure: Secondary | ICD-10-CM

## 2017-10-20 DIAGNOSIS — R55 Syncope and collapse: Secondary | ICD-10-CM

## 2017-10-20 MED ORDER — CLOPIDOGREL BISULFATE 75 MG PO TABS: 75.0000 mg | ORAL_TABLET | Freq: Every day | ORAL | 6 refills | Status: DC

## 2017-10-20 MED ORDER — POTASSIUM CHLORIDE ER 10 MEQ PO TBCR: 10.0000 meq | EXTENDED_RELEASE_TABLET | Freq: Every day | ORAL | 3 refills | Status: DC

## 2017-10-20 MED ORDER — FUROSEMIDE 40 MG PO TABS: ORAL_TABLET | ORAL | 3 refills | Status: DC

## 2017-10-20 MED ORDER — CARVEDILOL 3.125 MG PO TABS: 3.1250 mg | ORAL_TABLET | Freq: Two times a day (BID) | ORAL | 3 refills | Status: DC

## 2017-10-20 MED ORDER — EZETIMIBE 10 MG PO TABS: 10.0000 mg | ORAL_TABLET | Freq: Every day | ORAL | 3 refills | Status: DC

## 2017-10-20 MED ORDER — SACUBITRIL-VALSARTAN 97-103 MG PO TABS: 1.0000 | ORAL_TABLET | Freq: Two times a day (BID) | ORAL | 0 refills | Status: DC

## 2017-10-20 NOTE — Patient Instructions (Addendum)
Medication Instructions:  Your physician has recommended you make the following change in your medication:  1. START Zetia (ezetimbe) once daily 2. STAY on Lipitor 3. Refills sent in.  Medication Samples have been provided to the patient.  Drug name: Delene Loll       Strength: 49/51 mg        Qty: 2 boxes  LOT: [id number]  Exp.Date: 1/21  Labwork:  No new labs needed  Testing/Procedures:  No further testing at this time   Follow-Up: It was a pleasure seeing you in the office today. Please call us if you have new issues that need to be addressed before your next appt.  [phone]  Your physician wants you to follow-up in: 12 months.  You will receive a reminder letter in the mail two months in advance. If you don't receive a letter, please call our office to schedule the follow-up appointment.  If you need a refill on your cardiac medications before your next appointment, please call your pharmacy.  For educational health videos Log in to : [email or web address] Or : [email or web address], password : [email or web address]   Primary care # [phone] Assistance for entresto: Review cards  Phone number: [phone]

## 2017-11-06 ENCOUNTER — Telehealth: Payer: Self-pay | Admitting: Cardiovascular Disease

## 2017-11-06 NOTE — Telephone Encounter (Signed)
Would you like me to provide samples for this patient?

## 2017-11-06 NOTE — Telephone Encounter (Signed)
Patient calling the office for samples of medication:   1.  What medication and dosage are you requesting samples for? Entresto 97-103 MG  2.  Are you currently out of this medication? Yes

## 2017-11-06 NOTE — Telephone Encounter (Signed)
I notified patient that we do not have Entresto 97-103mg  samples.  He was given samples and patient assistance forms at March 25 OV. He has not yet completed forms. Advised to submit the information as soon as possible. Patient reports Entresto $65/month and states he needs enough to get him through next week. Provided [employer] information and advised him to call pharmacy for cost to fill one week supply. Patient appreciative of information.

## 2017-12-11 ENCOUNTER — Telehealth: Payer: Self-pay | Admitting: Cardiology

## 2017-12-11 ENCOUNTER — Encounter: Payer: Medicare Other | Admitting: *Deleted

## 2017-12-11 NOTE — Telephone Encounter (Signed)
LMOVM reminding pt to send remote transmission.   

## 2017-12-12 ENCOUNTER — Ambulatory Visit (INDEPENDENT_AMBULATORY_CARE_PROVIDER_SITE_OTHER): Payer: 59 | Admitting: *Deleted

## 2017-12-12 ENCOUNTER — Encounter: Payer: Self-pay | Admitting: Cardiology

## 2017-12-12 DIAGNOSIS — I472 Ventricular tachycardia, unspecified: Secondary | ICD-10-CM

## 2017-12-15 ENCOUNTER — Encounter: Payer: Self-pay | Admitting: Cardiology

## 2017-12-15 LAB — CUP PACEART REMOTE DEVICE CHECK
HighPow Impedance: 90 Ohm
Implantable Lead Implant Date: 20130503
Implantable Lead Location: 753860
Implantable Lead Model: 292
Implantable Lead Serial Number: 112568
Lead Channel Impedance Value: 613 Ohm
Lead Channel Pacing Threshold Amplitude: 0.7 V
Lead Channel Pacing Threshold Pulse Width: 0.5 ms
MDC IDC MSMT BATTERY REMAINING LONGEVITY: 90 mo
MDC IDC MSMT BATTERY REMAINING PERCENTAGE: 93 %
MDC IDC PG IMPLANT DT: 20130503
MDC IDC PG SERIAL: 105232
MDC IDC SESS DTM: 20190517205300
MDC IDC SET LEADCHNL RV PACING AMPLITUDE: 2.4 V
MDC IDC SET LEADCHNL RV PACING PULSEWIDTH: 0.5 ms
MDC IDC SET LEADCHNL RV SENSING SENSITIVITY: 0.6 mV
MDC IDC STAT BRADY RV PERCENT PACED: 0 %

## 2017-12-15 NOTE — Progress Notes (Signed)
Remote ICD transmission.   

## 2017-12-23 ENCOUNTER — Other Ambulatory Visit: Payer: Self-pay

## 2017-12-23 MED ORDER — SACUBITRIL-VALSARTAN 97-103 MG PO TABS: 1.0000 | ORAL_TABLET | Freq: Two times a day (BID) | ORAL | 3 refills | Status: DC

## 2017-12-23 MED ORDER — AMIODARONE HCL 200 MG PO TABS: 300.0000 mg | ORAL_TABLET | Freq: Every day | ORAL | 3 refills | Status: DC

## 2018-01-06 ENCOUNTER — Other Ambulatory Visit: Payer: Self-pay | Admitting: *Deleted

## 2018-01-06 MED ORDER — ATORVASTATIN CALCIUM 80 MG PO TABS: 80.0000 mg | ORAL_TABLET | Freq: Every day | ORAL | 9 refills | Status: DC

## 2018-02-12 DIAGNOSIS — R9431 Abnormal electrocardiogram [ECG] [EKG]: Secondary | ICD-10-CM | POA: Diagnosis not present

## 2018-02-12 DIAGNOSIS — N179 Acute kidney failure, unspecified: Secondary | ICD-10-CM | POA: Diagnosis not present

## 2018-02-12 DIAGNOSIS — Z87891 Personal history of nicotine dependence: Secondary | ICD-10-CM | POA: Diagnosis not present

## 2018-02-12 DIAGNOSIS — Z7902 Long term (current) use of antithrombotics/antiplatelets: Secondary | ICD-10-CM | POA: Diagnosis not present

## 2018-02-12 DIAGNOSIS — I200 Unstable angina: Secondary | ICD-10-CM | POA: Diagnosis not present

## 2018-02-12 DIAGNOSIS — I255 Ischemic cardiomyopathy: Secondary | ICD-10-CM | POA: Diagnosis not present

## 2018-02-12 DIAGNOSIS — I130 Hypertensive heart and chronic kidney disease with heart failure and stage 1 through stage 4 chronic kidney disease, or unspecified chronic kidney disease: Secondary | ICD-10-CM | POA: Diagnosis not present

## 2018-02-12 DIAGNOSIS — M79605 Pain in left leg: Secondary | ICD-10-CM | POA: Insufficient documentation

## 2018-02-12 DIAGNOSIS — R0602 Shortness of breath: Secondary | ICD-10-CM | POA: Diagnosis not present

## 2018-02-12 DIAGNOSIS — M79602 Pain in left arm: Secondary | ICD-10-CM | POA: Diagnosis not present

## 2018-02-12 DIAGNOSIS — Z79899 Other long term (current) drug therapy: Secondary | ICD-10-CM | POA: Diagnosis not present

## 2018-02-12 DIAGNOSIS — N189 Chronic kidney disease, unspecified: Secondary | ICD-10-CM | POA: Diagnosis not present

## 2018-02-12 DIAGNOSIS — I25110 Atherosclerotic heart disease of native coronary artery with unstable angina pectoris: Secondary | ICD-10-CM | POA: Diagnosis not present

## 2018-02-12 DIAGNOSIS — R936 Abnormal findings on diagnostic imaging of limbs: Secondary | ICD-10-CM | POA: Diagnosis not present

## 2018-02-12 DIAGNOSIS — I252 Old myocardial infarction: Secondary | ICD-10-CM | POA: Diagnosis not present

## 2018-02-12 DIAGNOSIS — E785 Hyperlipidemia, unspecified: Secondary | ICD-10-CM | POA: Diagnosis not present

## 2018-02-12 DIAGNOSIS — Z7982 Long term (current) use of aspirin: Secondary | ICD-10-CM | POA: Diagnosis not present

## 2018-02-12 DIAGNOSIS — J449 Chronic obstructive pulmonary disease, unspecified: Secondary | ICD-10-CM | POA: Diagnosis not present

## 2018-02-12 DIAGNOSIS — I5023 Acute on chronic systolic (congestive) heart failure: Secondary | ICD-10-CM | POA: Diagnosis not present

## 2018-02-12 DIAGNOSIS — M7989 Other specified soft tissue disorders: Secondary | ICD-10-CM | POA: Insufficient documentation

## 2018-02-12 DIAGNOSIS — Z95810 Presence of automatic (implantable) cardiac defibrillator: Secondary | ICD-10-CM | POA: Diagnosis not present

## 2018-02-12 DIAGNOSIS — R0789 Other chest pain: Secondary | ICD-10-CM | POA: Insufficient documentation

## 2018-02-13 DIAGNOSIS — I255 Ischemic cardiomyopathy: Secondary | ICD-10-CM | POA: Diagnosis not present

## 2018-02-13 DIAGNOSIS — R0789 Other chest pain: Secondary | ICD-10-CM | POA: Diagnosis not present

## 2018-02-13 DIAGNOSIS — I200 Unstable angina: Secondary | ICD-10-CM | POA: Diagnosis not present

## 2018-02-13 DIAGNOSIS — Z720 Tobacco use: Secondary | ICD-10-CM | POA: Diagnosis not present

## 2018-02-13 MED ORDER — FUROSEMIDE 40 MG PO TABS
40.00 | ORAL_TABLET | ORAL | Status: DC
Start: 2018-02-14 — End: 2018-02-13

## 2018-02-13 MED ORDER — HEPARIN SODIUM (PORCINE) 5000 UNIT/ML IJ SOLN
5000.00 | INTRAMUSCULAR | Status: DC
Start: 2018-02-13 — End: 2018-02-13

## 2018-02-13 MED ORDER — AMIODARONE HCL 200 MG PO TABS
200.00 | ORAL_TABLET | ORAL | Status: DC
Start: 2018-02-13 — End: 2018-02-13

## 2018-02-13 MED ORDER — LIDOCAINE HCL 1 % IJ SOLN
0.50 | INTRAMUSCULAR | Status: DC
Start: ? — End: 2018-02-13

## 2018-02-13 MED ORDER — SPIRONOLACTONE 25 MG PO TABS
12.50 | ORAL_TABLET | ORAL | Status: DC
Start: 2018-02-14 — End: 2018-02-13

## 2018-02-13 MED ORDER — ACETAMINOPHEN 325 MG PO TABS
650.00 | ORAL_TABLET | ORAL | Status: DC
Start: ? — End: 2018-02-13

## 2018-02-13 MED ORDER — CLOPIDOGREL BISULFATE 75 MG PO TABS
75.00 | ORAL_TABLET | ORAL | Status: DC
Start: 2018-02-14 — End: 2018-02-13

## 2018-02-13 MED ORDER — ATORVASTATIN CALCIUM 80 MG PO TABS
80.00 | ORAL_TABLET | ORAL | Status: DC
Start: 2018-02-14 — End: 2018-02-13

## 2018-02-13 MED ORDER — CARVEDILOL 3.125 MG PO TABS
3.13 | ORAL_TABLET | ORAL | Status: DC
Start: 2018-02-13 — End: 2018-02-13

## 2018-02-13 MED ORDER — IPRATROPIUM-ALBUTEROL 0.5-2.5 (3) MG/3ML IN SOLN
3.00 | RESPIRATORY_TRACT | Status: DC
Start: ? — End: 2018-02-13

## 2018-02-13 MED ORDER — EZETIMIBE 10 MG PO TABS
10.00 | ORAL_TABLET | ORAL | Status: DC
Start: 2018-02-13 — End: 2018-02-13

## 2018-02-13 MED ORDER — OXYCODONE HCL 5 MG PO TABS
5.00 | ORAL_TABLET | ORAL | Status: DC
Start: ? — End: 2018-02-13

## 2018-02-13 MED ORDER — ASPIRIN 81 MG PO CHEW
81.00 | CHEWABLE_TABLET | ORAL | Status: DC
Start: 2018-02-14 — End: 2018-02-13

## 2018-02-13 MED ORDER — PANTOPRAZOLE SODIUM 40 MG PO TBEC
40.00 | DELAYED_RELEASE_TABLET | ORAL | Status: DC
Start: 2018-02-14 — End: 2018-02-13

## 2018-02-13 MED ORDER — NICOTINE 21 MG/24HR TD PT24
1.00 | MEDICATED_PATCH | TRANSDERMAL | Status: DC
Start: 2018-02-14 — End: 2018-02-13

## 2018-02-15 NOTE — Progress Notes (Signed)
Cardiology Office Note  Date:  02/17/2018   ID:  Ian Mills, DOB 1956-05-25, MRN [id number]  PCP:  Patient, No Pcp Per   Chief Complaint  Patient presents with  . Other    Hospital follow up from [place] for Fluid retention and low HR. [place] lower extremity venous bilateral 02/12/18 in [place] and ECHO from 02/13/2018 in [place]. Meds reviewed verbally with patient.     HPI:  Ian Mills is a 62 -year-old gentleman with a history of  Smoking, continues to smoke 1 pack a week coronary artery disease,  3 stents placed to his mid LAD, one stent to the PDA, one stent to the OM 3 in 08/2006  chest pressure during his divorce with stress test at [facility],  cardiac catheterization at [facility], 11/2011 syncope x4 episodes total over the past several years ICD, ejection fraction 30-35%, chronic back and hip pain He presents today for follow-up of his coronary artery disease.  Admitted to [facility] 02/12/2018 with angina Hospital records reviewed with the patient in detail Also left leg swelling,  left arm pain Ramipril stopped Leg swelling 3 days, Calf Than acute left arm heaviness and throbbing Then chest tightness, did not take nitroglycerin Ruled out her non-STEMI Given IV Lasix No DVT Had bakers cyst 5.3 cm on the left Lasix increased to 40 BID  ECHO EF 30 to 35%, reviewed with him in detail Mod enlarged LA  Lab work reviewed CR 1.4  In follow-up today reports his breathing has improved Leg pain moderately improved Continues to smoke less than 1 pack/week Difficulty quitting Reports he had discussed with [facility] obtaining free nicotine patches, never came through Denies any chest pain or shortness of breath with exertion  aking care of 65-year-old child who presents with him today  Previously seen by [facility] for chronic back pain Previous cortisone shots every few months  EKG personally reviewed by myself on todays visit Shows normal sinus  rhythm with rate 55 bpm old anterior MI No change in EKG  Other past medical history reviewed On his last clinic visit he had episode of acute shortness of breath is past Saturday, associated with pounding of his heart, tachycardia He was taken by EMTs to [facility] There he was treated for acute on chronic systolic CHF, treated with IV Lasix Echocardiogram confirmed ejection fraction 30-35%, unchanged from previous studies ICD was not interrogated Peak troponin 0.6. Initially was given antibiotics for pneumonia, this was discontinued  Echo 05/2016 reviewed with him in detail Dilated left ventricle and left atrium remaining chambers are normal in size Anteroapical akinesis and moderate hypokinesis of the lateral wall normal wall motion of the inferior wall overall LV ejection fraction calculated at 30 to 35% Valves appear structurally normal mild mitral regurgitation Pacing wire right atrium/right ventricle  Previous nuclear stress testing on 03/06/2015 that showed hypertensive response to exercise with a large fixed defect of severe severity present in the mid anterior, mid anteroseptal, apical anterior, apical inferior and apex location with no significant reversibility, findings were consistent with prior MI. EF 27%.    presented to [facility] ED following the stress test with complaints of sudden onset of  intermittent chest pain, SOB, and lightheadedness. Troponin was negative . CXR showed no active cardiopulmonary disease. He was found to be hypokalemic at 3.3. But episode of syncope 05/29/2014. He got up overnight to go to the bathroom, after urination he felt very lightheaded, had syncope, hit his head.  He went to the hospital and was evaluated.  Notes from the hospital were reviewed including the testing, lab work and consultation notes Seen by cardiology It was felt he had vasovagal syncope. ICD was interrogated showing no arrhythmia Carotid ultrasound showed only mild to  moderate carotid arterial disease bilaterally Echocardiogram was essentially unchanged with ejection fraction 25-30%  May 2013, he had ventricular tachycardia while in the emergency room while at work. He was diaphoretic, required resuscitation. He was transferred to [facility] and had a cardiac catheterization with no PCI. Chronically occluded LAD, moderately diseased RCA. He had defibrillator placed 12/01/2011 presumably given his large anterior scar and the VT requiring defibrillation  Emergency room visit 08/17/2012 for chest pain. Cardiac enzymes negative. Discharged home 1 previous episode of nausea, lightheadedness, near syncope. run of nonsustained VT on 11/06/2012, did not correlate with symptoms  MRI done of his heart in February 2008 showed anterior and anterolateral region was nonviable. Ejection fraction by catheterization at that time was 35%.  hospital admission 06/28/2013 for syncope, malaise, chest pain. He had a fight with his separated wife, at that time had syncope. He presented to the hospital, cardiac enzymes were negative but considering is frequent episodes of chest pain, cardiac catheterization was performed. Cardiac cath on 06/29/2013 done by radial access showed 70% proximal RCA disease with DES placed. FFR was done to confirm stenosis. He had a patent stent in the left circumflex, chronically occluded LAD, ejection fraction 20%  PMH:   has a past medical history of Coronary artery disease, HLD (hyperlipidemia), HTN (hypertension), ICD  single,BSX, Ischemic cardiomyopathy, Syncope and collapse, and Ventricular tachycardia ([place]) (11/2011).  PSH:    Past Surgical History:  Procedure Laterality Date  . appendectomy    . CARDIAC CATHETERIZATION  Nov 30 2011   [facility]  . CARDIAC DEFIBRILLATOR PLACEMENT  May 2013  . CORONARY ANGIOPLASTY  2014   x 2 stents  . CORONARY STENT PLACEMENT     x7  . PROSTATE SURGERY    . TUMOR REMOVAL     chest    Current Outpatient  Medications  Medication Sig Dispense Refill  . amiodarone (PACERONE) 200 MG tablet Take 1.5 tablets (300 mg total) by mouth daily. 45 tablet 3  . aspirin 81 MG EC tablet Take 1 tablet (81 mg total) by mouth daily. 30 tablet 6  . atorvastatin (LIPITOR) 40 MG tablet Take 40 mg by mouth daily.    . carvedilol (COREG) 3.125 MG tablet Take 1 tablet (3.125 mg total) by mouth 2 (two) times daily. 180 tablet 3  . clopidogrel (PLAVIX) 75 MG tablet Take 1 tablet (75 mg total) by mouth daily. 30 tablet 6  . ezetimibe (ZETIA) 10 MG tablet Take 1 tablet (10 mg total) by mouth daily. 90 tablet 3  . furosemide (LASIX) 40 MG tablet Take 40 mg by mouth 2 (two) times daily.    Marland Kitchen gabapentin (NEURONTIN) 300 MG capsule Take 300 mg by mouth 2 (two) times daily.  0  . nitroGLYCERIN (NITROSTAT) 0.4 MG SL tablet Place 1 tablet (0.4 mg total) under the tongue every 5 (five) minutes as needed for chest pain. 25 tablet 3  . potassium chloride (KLOR-CON 10) 10 MEQ tablet Take 1 tablet (10 mEq total) by mouth daily. 90 tablet 3  . sacubitril-valsartan (ENTRESTO) 97-103 MG Take 1 tablet by mouth 2 (two) times daily. 60 tablet 3  . spironolactone (ALDACTONE) 25 MG tablet Take by mouth.    . tadalafil (CIALIS) 10 MG  tablet Take 1 tablet (10 mg total) by mouth daily as needed for erectile dysfunction (take 30 min prior to when needed.). 10 tablet 3   No current facility-administered medications for this visit.      Allergies:   Nitroglycerin   Social History:  The patient  reports that he has been smoking cigarettes.  He has a 3.50 pack-year smoking history. He has never used smokeless tobacco. He reports that he drank about 7.2 oz of alcohol per week. He reports that he does not use drugs.   Family History:   family history includes Cancer in his father; Cancer - Lung in his mother; Heart attack in his mother; Hypertension in his mother.    Review of Systems: Review of Systems  Constitutional: Negative.        Weight  gain  HENT: Negative.   Respiratory: Negative.   Cardiovascular: Negative.   Gastrointestinal: Negative.   Musculoskeletal: Negative.   Neurological: Negative.   Psychiatric/Behavioral: Negative.   All other systems reviewed and are negative.   PHYSICAL EXAM: VS:  BP 122/60 (BP Location: Left Arm, Patient Position: Sitting, Cuff Size: Normal)   Pulse (!) 55   Ht 5\' 9"  (1.753 m)   Wt 199 lb (90.3 kg)   BMI 29.39 kg/m  , BMI Body mass index is 29.39 kg/m. Constitutional:  oriented to person, place, and time. No distress.  HENT:  Head: Normocephalic and atraumatic.  Eyes:  no discharge. No scleral icterus.  Neck: Normal range of motion. Neck supple. No JVD present.  Cardiovascular: Normal rate, regular rhythm, normal heart sounds and intact distal pulses. Exam reveals no gallop and no friction rub. No edema No murmur heard. Pulmonary/Chest: Effort normal and breath sounds normal. No stridor. No respiratory distress.  no wheezes.  no rales.  no tenderness.  Abdominal: Soft.  no distension.  no tenderness.  Musculoskeletal: Normal range of motion.  no  tenderness or deformity.  Neurological:  normal muscle tone. Coordination normal. No atrophy Skin: Skin is warm and dry. No rash noted. not diaphoretic.  Psychiatric:  normal mood and affect. behavior is normal. Thought content normal.    Recent Labs: 04/22/2017: ALT 42; TSH 2.080    Lipid Panel Lab Results  Component Value Date   CHOL 170 12/09/2016   HDL 53 12/09/2016   LDLCALC 94 12/09/2016   TRIG 113 12/09/2016      Wt Readings from Last 3 Encounters:  02/17/18 199 lb (90.3 kg)  10/20/17 207 lb (93.9 kg)  04/22/17 188 lb 8 oz (85.5 kg)      ASSESSMENT AND PLAN:   Coronary artery disease involving native coronary artery of native heart without angina pectoris -  Recommended smoking cessation, weight loss lifestyle modification No medication changes made Stressed importance of setting up with primary  care  Spinal stenosis of lumbar region, unspecified whether neurogenic claudication present -  Followed at [facility] , periodic cortisone shots  Stable. No further testing needed  Ventricular tachycardia ([place]) ICD download through EP  Denies any recent defibrillations  Ischemic cardiomyopathy Ejection fraction 30-35%,consistent with recent echocardiogram EKG unchanged, no new symptoms Now on Lasix 40 twice a day, suspect some dietary indiscretion  Mixed hyperlipidemia Continue Lipitor and Zetia Goal LDL less than 70  Single implantable cardioverter-defibrillator-BSx Followed by EP  Shortness of breath Unable to afford [name] or other patches Recommended he try vapor cigarette as bridge to quitting Continues to smoke. Stressed importance of smoking cessation  Acute on chronic  systolic CHF (congestive heart failure) ([place]) Appears euvolemic on today's visit   continue Lasix , entresto, Aldactone  Extensive review of recent hospitalization records  Total encounter time more than 45 minutes  Greater than 50% was spent in counseling and coordination of care with the patient  Disposition:   F/U  6 months   Orders Placed This Encounter  Procedures  . EKG 12-Lead     Signed, Esmond Plants, M.D., Ph.D. 02/17/2018  [place], [place]

## 2018-02-17 ENCOUNTER — Ambulatory Visit (INDEPENDENT_AMBULATORY_CARE_PROVIDER_SITE_OTHER): Payer: 59 | Admitting: Cardiovascular Disease

## 2018-02-17 ENCOUNTER — Encounter: Payer: Self-pay | Admitting: Cardiovascular Disease

## 2018-02-17 VITALS — BP 122/60 | HR 55 | Ht 69.0 in | Wt 199.0 lb

## 2018-02-17 DIAGNOSIS — Z95810 Presence of automatic (implantable) cardiac defibrillator: Secondary | ICD-10-CM

## 2018-02-17 DIAGNOSIS — I5023 Acute on chronic systolic (congestive) heart failure: Secondary | ICD-10-CM | POA: Diagnosis not present

## 2018-02-17 DIAGNOSIS — I5022 Chronic systolic (congestive) heart failure: Secondary | ICD-10-CM | POA: Diagnosis not present

## 2018-02-17 DIAGNOSIS — I472 Ventricular tachycardia, unspecified: Secondary | ICD-10-CM

## 2018-02-17 DIAGNOSIS — R55 Syncope and collapse: Secondary | ICD-10-CM | POA: Diagnosis not present

## 2018-02-17 DIAGNOSIS — I25118 Atherosclerotic heart disease of native coronary artery with other forms of angina pectoris: Secondary | ICD-10-CM

## 2018-02-17 DIAGNOSIS — I255 Ischemic cardiomyopathy: Secondary | ICD-10-CM | POA: Diagnosis not present

## 2018-02-17 DIAGNOSIS — E782 Mixed hyperlipidemia: Secondary | ICD-10-CM | POA: Diagnosis not present

## 2018-02-17 MED ORDER — SPIRONOLACTONE 25 MG PO TABS: 25.0000 mg | ORAL_TABLET | Freq: Every day | ORAL | 11 refills | Status: DC

## 2018-02-17 NOTE — Patient Instructions (Addendum)

## 2018-03-16 ENCOUNTER — Ambulatory Visit (INDEPENDENT_AMBULATORY_CARE_PROVIDER_SITE_OTHER): Payer: 59 | Admitting: *Deleted

## 2018-03-16 DIAGNOSIS — I472 Ventricular tachycardia, unspecified: Secondary | ICD-10-CM

## 2018-03-17 NOTE — Progress Notes (Signed)
Remote ICD transmission.   

## 2018-04-06 ENCOUNTER — Other Ambulatory Visit: Payer: Self-pay

## 2018-04-20 LAB — CUP PACEART REMOTE DEVICE CHECK
Battery Remaining Longevity: 84 mo
Date Time Interrogation Session: 20190819062100
HIGH POWER IMPEDANCE MEASURED VALUE: 94 Ohm
Implantable Lead Implant Date: 20130503
Implantable Lead Location: 753860
Implantable Lead Model: 292
Implantable Lead Serial Number: 112568
Implantable Pulse Generator Implant Date: 20130503
Lead Channel Pacing Threshold Amplitude: 0.7 V
Lead Channel Pacing Threshold Pulse Width: 0.5 ms
Lead Channel Setting Pacing Pulse Width: 0.5 ms
MDC IDC MSMT BATTERY REMAINING PERCENTAGE: 90 %
MDC IDC MSMT LEADCHNL RV IMPEDANCE VALUE: 628 Ohm
MDC IDC SET LEADCHNL RV PACING AMPLITUDE: 2.4 V
MDC IDC SET LEADCHNL RV SENSING SENSITIVITY: 0.6 mV
MDC IDC STAT BRADY RV PERCENT PACED: 0 %
Pulse Gen Serial Number: 105232

## 2018-05-13 ENCOUNTER — Telehealth: Payer: Self-pay

## 2018-05-13 MED ORDER — SACUBITRIL-VALSARTAN 97-103 MG PO TABS: 1.0000 | ORAL_TABLET | Freq: Two times a day (BID) | ORAL | 3 refills | Status: DC

## 2018-05-13 NOTE — Telephone Encounter (Signed)
Pt requesting refill on Entresto sent to [facility].

## 2018-05-22 DIAGNOSIS — E291 Testicular hypofunction: Secondary | ICD-10-CM | POA: Diagnosis not present

## 2018-05-22 DIAGNOSIS — N3020 Other chronic cystitis without hematuria: Secondary | ICD-10-CM | POA: Diagnosis not present

## 2018-05-22 DIAGNOSIS — N401 Enlarged prostate with lower urinary tract symptoms: Secondary | ICD-10-CM | POA: Diagnosis not present

## 2018-05-22 DIAGNOSIS — N538 Other male sexual dysfunction: Secondary | ICD-10-CM | POA: Diagnosis not present

## 2018-05-22 DIAGNOSIS — N400 Enlarged prostate without lower urinary tract symptoms: Secondary | ICD-10-CM | POA: Diagnosis not present

## 2018-05-22 DIAGNOSIS — N318 Other neuromuscular dysfunction of bladder: Secondary | ICD-10-CM | POA: Diagnosis not present

## 2018-05-29 ENCOUNTER — Other Ambulatory Visit: Payer: Self-pay | Admitting: *Deleted

## 2018-05-29 MED ORDER — AMIODARONE HCL 200 MG PO TABS: 300.0000 mg | ORAL_TABLET | Freq: Every day | ORAL | 2 refills | Status: DC

## 2018-06-16 ENCOUNTER — Ambulatory Visit (INDEPENDENT_AMBULATORY_CARE_PROVIDER_SITE_OTHER): Payer: Medicare Other

## 2018-06-16 ENCOUNTER — Telehealth: Payer: Self-pay | Admitting: Cardiovascular Disease

## 2018-06-16 DIAGNOSIS — I255 Ischemic cardiomyopathy: Secondary | ICD-10-CM | POA: Diagnosis not present

## 2018-06-16 NOTE — Telephone Encounter (Signed)
Patient calling the office for samples of medication:   1.  What medication and dosage are you requesting samples for? Entresto    2.  Are you currently out of this medication? Yes    Patient also dropped paperwork for [facility] assistance form  Placed in nurses bin

## 2018-06-16 NOTE — Telephone Encounter (Signed)
Patient calling to check on status.

## 2018-06-16 NOTE — Telephone Encounter (Signed)
Patient walked in to clinic today asking for entresto 97/103 mg samples. Patient was advised that we do not carry this strength sample. He has already used the # 30 trial card. He has Medicare, so doesn't qualify for $10 copay card.  Per Dr. Rockey Situ, ok to give samples of Entresto 49/51 mg - take 2 tablets twice daily until patient assistance can be processed.   Entresto 49/51 mg samples given Lot: [id number] Exp: 1/21 # 2 boxes  Patient given verbal and written instructions to take Entresto 49/51 mg tablets- 2 tablets twice daily. He verbalized understanding.

## 2018-06-16 NOTE — Telephone Encounter (Signed)
Patient is requesting samples of Entresto we do not have his current dosage 97/103 in stock. We currently have 24/ 26 and 49/51, we also have a $10 co-pay card for 30,60 or 90 day supply which he can pick up.He did drop off his patient assistance application today.  Please advise.

## 2018-06-17 NOTE — Telephone Encounter (Signed)
Paper work placed on Dr. Donivan Scull desk to review/ sign.

## 2018-06-17 NOTE — Progress Notes (Signed)
Remote ICD transmission.   

## 2018-06-24 ENCOUNTER — Other Ambulatory Visit: Payer: Self-pay | Admitting: *Deleted

## 2018-06-24 ENCOUNTER — Telehealth: Payer: Self-pay | Admitting: Cardiovascular Disease

## 2018-06-24 MED ORDER — ATORVASTATIN CALCIUM 40 MG PO TABS: 40.0000 mg | ORAL_TABLET | Freq: Every day | ORAL | 2 refills | Status: DC

## 2018-06-24 NOTE — Telephone Encounter (Signed)
Requested Prescriptions   Signed Prescriptions Disp Refills  . atorvastatin (LIPITOR) 40 MG tablet 30 tablet 2    Sig: Take 1 tablet (40 mg total) by mouth daily.    Authorizing Provider: Minna Merritts    Ordering User: Britt Bottom

## 2018-06-24 NOTE — Telephone Encounter (Signed)
°*  STAT* If patient is at the pharmacy, call can be transferred to refill team.   1. Which medications need to be refilled? (please list name of each medication and dose if known) Lipitor 40 mg po q day   2. Which pharmacy/location (including street and city if local pharmacy) is medication to be sent to? [place]   3. Do they need a 30 day or 90 day supply? [place]

## 2018-06-30 NOTE — Telephone Encounter (Signed)
Per Pam, she has the signed paper work from Dr. Rockey Situ.  Will forward note to her to complete when paperwork has been processed.

## 2018-07-01 NOTE — Telephone Encounter (Signed)
Paperwork faxed to patient assistance and forms placed in file cabinet located in samples closet.

## 2018-07-01 NOTE — Telephone Encounter (Signed)
Patient calling  States that he only has 2 Entresto left Wants to discuss with nurse about status of paperwork and if there is anything they can offer while waiting Please call to discuss

## 2018-07-01 NOTE — Telephone Encounter (Signed)
To Pam as she has the patient's paperwork.

## 2018-07-02 NOTE — Telephone Encounter (Signed)
Patient calling the office for samples of medication:   1.  What medication and dosage are you requesting samples for? Entresto   2.  Are you currently out of this medication? Yes    

## 2018-07-02 NOTE — Telephone Encounter (Signed)
Please advise if ok to give pt samples until pt has been approved by Pt Assistance program.

## 2018-07-03 ENCOUNTER — Other Ambulatory Visit: Payer: Self-pay | Admitting: Cardiovascular Disease

## 2018-07-03 MED ORDER — VALSARTAN 160 MG PO TABS: 160.0000 mg | ORAL_TABLET | Freq: Every day | ORAL | 0 refills | Status: DC

## 2018-07-03 NOTE — Telephone Encounter (Signed)
Spoke with patient and he is currently out of his Entresto. We do not have any samples in the office and his patient assistance application has been faxed but no response at this time. Sending message to provider again to see if there is another medication he can take until we see if he qualifies through the assistance program. Calling [facility] to check on status of application. Advised patient that I would call him back by noon with some information. He was appreciative for the call with no further questions at this time.

## 2018-07-03 NOTE — Telephone Encounter (Signed)
Secure message sent to provider and recommendations were to start valsartan 160 mg daily. Will reach out to patient to review this with him.

## 2018-07-03 NOTE — Telephone Encounter (Signed)
Spoke with patient and reviewed that we would send in Valsartan 160 mg once daily until we can determine if he will qualify for patient assistance. He requested that I send that to [place] and he verbalized understanding that if he receives free patient assistance then we can change back to the [place]. He was appreciative for the call with no further questions at this time.

## 2018-07-03 NOTE — Telephone Encounter (Signed)
Pt is calling regarding his Entresto. He request a call be returned as soon as possible.

## 2018-07-03 NOTE — Telephone Encounter (Signed)
Called Entresto central to see what the status was on his application. They referred application over to assistance program and that will take 3-5 days to process. We can try to call them next week at [phone] to see the progress but until then they are not able to assist with medication at this time. Secure chat message sent to provider for review and recommendations.

## 2018-07-03 NOTE — Addendum Note (Signed)
Addended by: Valora Corporal on: 07/03/2018 11:02 AM   Modules accepted: Orders

## 2018-07-06 ENCOUNTER — Telehealth: Payer: Self-pay | Admitting: Cardiovascular Disease

## 2018-07-06 NOTE — Telephone Encounter (Signed)
Please call reagarding ICD-9 code for [facility]

## 2018-07-07 NOTE — Telephone Encounter (Signed)
Spoke with [facility] and provided ICD 9 code of I 25.5 and she states that she would notate that in the system. No further questions at this time.

## 2018-07-09 NOTE — Telephone Encounter (Signed)
Received fax from [employer] patient [place] that patient is approved to receive ENTRESTO until 07/29/2019 at no out-of-pocket cost as long as all program eligibility criteria continue to be met.  Called patient and let him know that he has been approved to get ENTRESTO. He was appreciative.

## 2018-08-12 LAB — CUP PACEART REMOTE DEVICE CHECK
Battery Remaining Longevity: 84 mo
Battery Remaining Percentage: 86 %
Date Time Interrogation Session: 20191119073400
HighPow Impedance: 79 Ohm
Implantable Lead Implant Date: 20130503
Implantable Lead Location: 753860
Implantable Lead Model: 292
Implantable Lead Serial Number: 112568
Implantable Pulse Generator Implant Date: 20130503
Lead Channel Impedance Value: 580 Ohm
Lead Channel Pacing Threshold Amplitude: 0.7 V
Lead Channel Pacing Threshold Pulse Width: 0.5 ms
Lead Channel Setting Pacing Pulse Width: 0.5 ms
Lead Channel Setting Sensing Sensitivity: 0.6 mV
MDC IDC SET LEADCHNL RV PACING AMPLITUDE: 2.4 V
MDC IDC STAT BRADY RV PERCENT PACED: 0 %
Pulse Gen Serial Number: 105232

## 2018-08-23 NOTE — Progress Notes (Signed)
Cardiology Office Note  Date:  08/24/2018   ID:  Ian Mills, DOB 1956-03-27, MRN [id number]  PCP:  Patient, No Pcp Per   Chief Complaint  Patient presents with  . other    6 month follow up. Meds reviewed by the pt. verbally. Pt. c/o fatigue more than usual.     HPI:  Ian Mills is a 63 -year-old gentleman with a history of  Smoking, continues to smoke 1 pack a week coronary artery disease,  3 stents placed to his mid LAD, one stent to the PDA, one stent to the OM 3 in 08/2006  chest pressure during his divorce with stress test at [facility],  cardiac catheterization at [facility], 11/2011 syncope x4 episodes total over the past several years ICD, ejection fraction 30-35%, chronic back and hip pain He presents today for follow-up of his coronary artery disease.  In follow-up today he reports he is doing well No regular exercise program, lives at a campsite, in an RV Denies any significant shortness of breath or chest pain on exertion No regular hobbies, walks around the campsite for activity  Has all meds, has assistance for the Entresto through the company Denies any significant leg edema, PND orthopnea  We reviewed his ICD downloads with him, no significant arrhythmia  ECHO EF 25 to 30%, 2015 Mod enlarged LA  Lab work reviewed No recent lab work available  Previously with back and sciatic pain, improved with cortisone shots seen by [employer] for chronic back pain  EKG personally reviewed by myself on todays visit Shows normal sinus rhythm rate 59 bpm old anterior MI , T wave abnormality anterolateral leads concerning for ischemia  Other past medical history reviewed Admitted to [facility] 02/12/2018 with angina  left leg swelling,  left arm pain Ramipril stopped Leg swelling 3 days, Calf Than acute left arm heaviness and throbbing Then chest tightness, did not take nitroglycerin Ruled out her non-STEMI Given IV Lasix No DVT Had bakers cyst 5.3  cm on the left Lasix increased to 40 BID  On prior office visit he had episode of acute shortness of breath  He was taken by EMTs to [facility]  treated for acute on chronic systolic CHF, treated with IV Lasix Echocardiogram confirmed ejection fraction 30-35%, unchanged from previous studies ICD was not interrogated Peak troponin 0.6.   Echo 05/2016 reviewed with him in detail Dilated left ventricle and left atrium remaining chambers are normal in size Anteroapical akinesis and moderate hypokinesis of the lateral wall normal wall motion of the inferior wall overall LV ejection fraction calculated at 30 to 35% Valves appear structurally normal mild mitral regurgitation Pacing wire right atrium/right ventricle  Previous nuclear stress testing on 03/06/2015 that showed hypertensive response to exercise with a large fixed defect of severe severity present in the mid anterior, mid anteroseptal, apical anterior, apical inferior and apex location with no significant reversibility, findings were consistent with prior MI. EF 27%.    presented to [facility] ED following the stress test with complaints of sudden onset of  intermittent chest pain, SOB, and lightheadedness. Troponin was negative . CXR showed no active cardiopulmonary disease. He was found to be hypokalemic at 3.3. But episode of syncope 05/29/2014. He got up overnight to go to the bathroom, after urination he felt very lightheaded, had syncope, hit his head. He went to the hospital and was evaluated.  Notes from the hospital were reviewed including the testing, lab work and consultation notes  Seen by cardiology It was felt he had vasovagal syncope. ICD was interrogated showing no arrhythmia Carotid ultrasound showed only mild to moderate carotid arterial disease bilaterally Echocardiogram was essentially unchanged with ejection fraction 25-30%  May 2013, he had ventricular tachycardia while in the emergency room while at work.  He was diaphoretic, required resuscitation. He was transferred to [facility] and had a cardiac catheterization with no PCI. Chronically occluded LAD, moderately diseased RCA. He had defibrillator placed 12/01/2011 presumably given his large anterior scar and the VT requiring defibrillation  Emergency room visit 08/17/2012 for chest pain. Cardiac enzymes negative. Discharged home 1 previous episode of nausea, lightheadedness, near syncope. run of nonsustained VT on 11/06/2012, did not correlate with symptoms  MRI done of his heart in February 2008 showed anterior and anterolateral region was nonviable. Ejection fraction by catheterization at that time was 35%.  hospital admission 06/28/2013 for syncope, malaise, chest pain. He had a fight with his separated wife, at that time had syncope. He presented to the hospital, cardiac enzymes were negative but considering is frequent episodes of chest pain, cardiac catheterization was performed. Cardiac cath on 06/29/2013 done by radial access showed 70% proximal RCA disease with DES placed. FFR was done to confirm stenosis. He had a patent stent in the left circumflex, chronically occluded LAD, ejection fraction 20%  PMH:   has a past medical history of Coronary artery disease, HLD (hyperlipidemia), HTN (hypertension), ICD  single,BSX, Ischemic cardiomyopathy, Syncope and collapse, and Ventricular tachycardia ([place]) (11/2011).  PSH:    Past Surgical History:  Procedure Laterality Date  . appendectomy    . CARDIAC CATHETERIZATION  Nov 30 2011   [facility]  . CARDIAC DEFIBRILLATOR PLACEMENT  May 2013  . CORONARY ANGIOPLASTY  2014   x 2 stents  . CORONARY STENT PLACEMENT     x7  . PROSTATE SURGERY    . TUMOR REMOVAL     chest    Current Outpatient Medications  Medication Sig Dispense Refill  . amiodarone (PACERONE) 200 MG tablet Take 1.5 tablets (300 mg total) by mouth daily. 45 tablet 2  . aspirin 81 MG EC tablet Take 1 tablet (81 mg total) by mouth daily.  30 tablet 6  . atorvastatin (LIPITOR) 40 MG tablet Take 1 tablet (40 mg total) by mouth daily. 30 tablet 2  . carvedilol (COREG) 3.125 MG tablet Take 1 tablet (3.125 mg total) by mouth 2 (two) times daily. 180 tablet 3  . clopidogrel (PLAVIX) 75 MG tablet Take 1 tablet (75 mg total) by mouth daily. 30 tablet 6  . ezetimibe (ZETIA) 10 MG tablet Take 1 tablet (10 mg total) by mouth daily. 90 tablet 3  . furosemide (LASIX) 40 MG tablet Take 40 mg by mouth 2 (two) times daily.    . nitroGLYCERIN (NITROSTAT) 0.4 MG SL tablet Place 1 tablet (0.4 mg total) under the tongue every 5 (five) minutes as needed for chest pain. 25 tablet 3  . potassium chloride (KLOR-CON 10) 10 MEQ tablet Take 1 tablet (10 mEq total) by mouth daily. 90 tablet 3  . sacubitril-valsartan (ENTRESTO) 97-103 MG Take 1 tablet by mouth 2 (two) times daily. 60 tablet 3  . tadalafil (CIALIS) 10 MG tablet Take 1 tablet (10 mg total) by mouth daily as needed for erectile dysfunction (take 30 min prior to when needed.). 10 tablet 3   No current facility-administered medications for this visit.      Allergies:   Nitroglycerin   Social History:  The  patient  reports that he has been smoking cigarettes. He has a 3.50 pack-year smoking history. He has never used smokeless tobacco. He reports previous alcohol use of about 12.0 standard drinks of alcohol per week. He reports that he does not use drugs.   Family History:   family history includes Cancer in his father; Cancer - Lung in his mother; Heart attack in his mother; Hypertension in his mother.    Review of Systems: Review of Systems  Constitutional: Positive for malaise/fatigue.  HENT: Negative.   Respiratory: Negative.   Cardiovascular: Negative.   Gastrointestinal: Negative.   Musculoskeletal: Negative.   Neurological: Negative.   Psychiatric/Behavioral: Negative.   All other systems reviewed and are negative.   PHYSICAL EXAM: VS:  BP 110/60 (BP Location: Left Arm,  Patient Position: Sitting, Cuff Size: Normal)   Pulse (!) 59   Ht 5\' 8"  (1.727 m)   Wt 215 lb 8 oz (97.8 kg)   BMI 32.77 kg/m  , BMI Body mass index is 32.77 kg/m. Constitutional:  oriented to person, place, and time. No distress.  HENT:  Head: Grossly normal Eyes:  no discharge. No scleral icterus.  Neck: No JVD, no carotid bruits  Cardiovascular: Regular rate and rhythm, no murmurs appreciated Pulmonary/Chest: Clear to auscultation bilaterally, no wheezes or rails Abdominal: Soft.  no distension.  no tenderness.  Musculoskeletal: Normal range of motion Neurological:  normal muscle tone. Coordination normal. No atrophy Skin: Skin warm and dry Psychiatric: normal affect, pleasant  Recent Labs: No results found for requested labs within last 8760 hours.    Lipid Panel Lab Results  Component Value Date   CHOL 170 12/09/2016   HDL 53 12/09/2016   LDLCALC 94 12/09/2016   TRIG 113 12/09/2016      Wt Readings from Last 3 Encounters:  08/24/18 215 lb 8 oz (97.8 kg)  02/17/18 199 lb (90.3 kg)  10/20/17 207 lb (93.9 kg)      ASSESSMENT AND PLAN:   Coronary artery disease involving native coronary artery of native heart without angina pectoris -  Denies symptoms of angina Recommended regular walking program Lab work ordered for lipid panel  Spinal stenosis of lumbar region, unspecified whether neurogenic claudication present -  Followed at [facility] ,  Symptoms improved after cortisone shots  Ventricular tachycardia ([place]) ICD downloads reviewed with him on today's visit No significant arrhythmia No recent defibrillations Amiodarone surveillance labs ordered today  Ischemic cardiomyopathy Ejection fraction 30-35%,  no new symptoms Spironolactone fell off his list New prescription sent in Recommend a BMP in 1 month  Mixed hyperlipidemia Continue Lipitor and Zetia Goal LDL less than 70 Lipid panel ordered.  Previously at goal  Single implantable  cardioverter-defibrillator-BSx Followed by EP.  Downloads reviewed with him  Shortness of breath Continues to smoke. Stressed importance of smoking cessation Discussed with him again on today's visit  Acute on chronic systolic CHF (congestive heart failure) ([place]) Appears euvolemic on today's visit   continue Lasix , Coreg , Entresto, Aldactone   Total encounter time more than 25 minutes  Greater than 50% was spent in counseling and coordination of care with the patient  Disposition:   F/U  12 months We will alternate follow-up with EP   Orders Placed This Encounter  Procedures  . EKG 12-Lead     Signed, Esmond Plants, M.D., Ph.D. 08/24/2018  [place], [place]

## 2018-08-24 ENCOUNTER — Encounter: Payer: Self-pay | Admitting: Cardiovascular Disease

## 2018-08-24 ENCOUNTER — Ambulatory Visit (INDEPENDENT_AMBULATORY_CARE_PROVIDER_SITE_OTHER): Payer: Medicare Other | Admitting: Cardiovascular Disease

## 2018-08-24 VITALS — BP 110/60 | HR 59 | Ht 68.0 in | Wt 215.5 lb

## 2018-08-24 DIAGNOSIS — F17200 Nicotine dependence, unspecified, uncomplicated: Secondary | ICD-10-CM | POA: Diagnosis not present

## 2018-08-24 DIAGNOSIS — I5023 Acute on chronic systolic (congestive) heart failure: Secondary | ICD-10-CM | POA: Diagnosis not present

## 2018-08-24 DIAGNOSIS — Z79899 Other long term (current) drug therapy: Secondary | ICD-10-CM

## 2018-08-24 DIAGNOSIS — I472 Ventricular tachycardia, unspecified: Secondary | ICD-10-CM

## 2018-08-24 DIAGNOSIS — I25118 Atherosclerotic heart disease of native coronary artery with other forms of angina pectoris: Secondary | ICD-10-CM | POA: Diagnosis not present

## 2018-08-24 DIAGNOSIS — I255 Ischemic cardiomyopathy: Secondary | ICD-10-CM

## 2018-08-24 DIAGNOSIS — Z95810 Presence of automatic (implantable) cardiac defibrillator: Secondary | ICD-10-CM | POA: Diagnosis not present

## 2018-08-24 DIAGNOSIS — E782 Mixed hyperlipidemia: Secondary | ICD-10-CM

## 2018-08-24 MED ORDER — SPIRONOLACTONE 25 MG PO TABS: 25.0000 mg | ORAL_TABLET | Freq: Every day | ORAL | 3 refills | Status: DC

## 2018-08-24 NOTE — Patient Instructions (Addendum)
Medication Instructions:   Restart spironolactone one a day  If you need a refill on your cardiac medications before your next appointment, please call your pharmacy.    Lab work: BMP, lipids, TSH, LFTS  in one month, (fasting) Outside lab in [place] [facility]  - please take the orders with you from today when you go to have this done   If you have labs (blood work) drawn today and your tests are completely normal, you will receive your results only by: Marland Kitchen MyChart Message (if you have MyChart) OR . A paper copy in the mail If you have any lab test that is abnormal or we need to change your treatment, we will call you to review the results.   Testing/Procedures: No new testing needed   Follow-Up: At [facility], you and your health needs are our priority.  As part of our continuing mission to provide you with exceptional heart care, we have created designated Provider Care Teams.  These Care Teams include your primary Cardiologist (physician) and Advanced Practice Providers (APPs -  Physician Assistants and Nurse Practitioners) who all work together to provide you with the care you need, when you need it.  . You will need a follow up appointment in 12 months .   Please call our office 2 months in advance to schedule this appointment.    . Providers on your designated Care Team:   . Murray Hodgkins, NP . Christell Faith, PA-C . Marrianne Mood, PA-C  Any Other Special Instructions Will Be Listed Below (If Applicable).  For educational health videos Log in to : [email or web address] Or : [email or web address], password : [email or web address]

## 2018-08-28 ENCOUNTER — Other Ambulatory Visit: Payer: Self-pay

## 2018-08-28 MED ORDER — CARVEDILOL 3.125 MG PO TABS: 3.1250 mg | ORAL_TABLET | Freq: Two times a day (BID) | ORAL | 3 refills | Status: DC

## 2018-08-28 MED ORDER — FUROSEMIDE 40 MG PO TABS: 40.0000 mg | ORAL_TABLET | Freq: Two times a day (BID) | ORAL | 3 refills | Status: DC

## 2018-08-28 NOTE — Telephone Encounter (Signed)
*  STAT* If patient is at the pharmacy, call can be transferred to refill team.   1. Which medications need to be refilled? (please list name of each medication and dose if known) Coreg, Lasix  2. Which pharmacy/location (including street and city if local pharmacy) is medication to be sent to? [facility] [place]  3. Do they need a 30 day or 90 day supply? [place]

## 2018-08-29 DIAGNOSIS — J189 Pneumonia, unspecified organism: Secondary | ICD-10-CM

## 2018-08-29 HISTORY — DX: Pneumonia, unspecified organism: J18.9

## 2018-08-31 ENCOUNTER — Other Ambulatory Visit: Payer: Self-pay

## 2018-08-31 MED ORDER — CLOPIDOGREL BISULFATE 75 MG PO TABS: 75.0000 mg | ORAL_TABLET | Freq: Every day | ORAL | 6 refills | Status: DC

## 2018-08-31 NOTE — Telephone Encounter (Signed)
*  STAT* If patient is at the pharmacy, call can be transferred to refill team.   1. Which medications need to be refilled? (please list name of each medication and dose if known) Plavix  2. Which pharmacy/location (including street and city if local pharmacy) is medication to be sent to ? [facility] [place]  3. Do they need a 30 day or 90 day supply? [place]

## 2018-09-04 DIAGNOSIS — Z7902 Long term (current) use of antithrombotics/antiplatelets: Secondary | ICD-10-CM | POA: Diagnosis not present

## 2018-09-04 DIAGNOSIS — Z7982 Long term (current) use of aspirin: Secondary | ICD-10-CM | POA: Diagnosis not present

## 2018-09-04 DIAGNOSIS — I252 Old myocardial infarction: Secondary | ICD-10-CM | POA: Diagnosis not present

## 2018-09-04 DIAGNOSIS — I10 Essential (primary) hypertension: Secondary | ICD-10-CM | POA: Diagnosis not present

## 2018-09-04 DIAGNOSIS — R0789 Other chest pain: Secondary | ICD-10-CM | POA: Diagnosis not present

## 2018-09-04 DIAGNOSIS — R062 Wheezing: Secondary | ICD-10-CM | POA: Diagnosis not present

## 2018-09-04 DIAGNOSIS — Z79899 Other long term (current) drug therapy: Secondary | ICD-10-CM | POA: Diagnosis not present

## 2018-09-04 DIAGNOSIS — R079 Chest pain, unspecified: Secondary | ICD-10-CM | POA: Diagnosis not present

## 2018-09-04 DIAGNOSIS — Z955 Presence of coronary angioplasty implant and graft: Secondary | ICD-10-CM | POA: Diagnosis not present

## 2018-09-04 DIAGNOSIS — F17210 Nicotine dependence, cigarettes, uncomplicated: Secondary | ICD-10-CM | POA: Diagnosis not present

## 2018-09-04 DIAGNOSIS — I2510 Atherosclerotic heart disease of native coronary artery without angina pectoris: Secondary | ICD-10-CM | POA: Diagnosis not present

## 2018-09-04 DIAGNOSIS — R7989 Other specified abnormal findings of blood chemistry: Secondary | ICD-10-CM | POA: Diagnosis not present

## 2018-09-04 DIAGNOSIS — R05 Cough: Secondary | ICD-10-CM | POA: Diagnosis not present

## 2018-09-08 ENCOUNTER — Ambulatory Visit (INDEPENDENT_AMBULATORY_CARE_PROVIDER_SITE_OTHER): Payer: Medicare Other | Admitting: Internal Medicine

## 2018-09-08 ENCOUNTER — Other Ambulatory Visit: Payer: Self-pay

## 2018-09-08 ENCOUNTER — Inpatient Hospital Stay
Admission: EM | Admit: 2018-09-08 | Discharge: 2018-09-14 | DRG: 190 | Disposition: A | Discharge status: Home / Self Care | Payer: Medicare Other | Attending: Internal Medicine | Admitting: Internal Medicine

## 2018-09-08 ENCOUNTER — Encounter: Payer: Self-pay | Admitting: Internal Medicine

## 2018-09-08 ENCOUNTER — Encounter: Payer: Self-pay | Admitting: Emergency Medicine

## 2018-09-08 ENCOUNTER — Emergency Department: Payer: Medicare Other

## 2018-09-08 VITALS — BP 134/74 | HR 67 | Ht 68.0 in | Wt 211.5 lb

## 2018-09-08 DIAGNOSIS — Z7902 Long term (current) use of antithrombotics/antiplatelets: Secondary | ICD-10-CM | POA: Diagnosis not present

## 2018-09-08 DIAGNOSIS — Z888 Allergy status to other drugs, medicaments and biological substances status: Secondary | ICD-10-CM | POA: Diagnosis not present

## 2018-09-08 DIAGNOSIS — E860 Dehydration: Secondary | ICD-10-CM | POA: Diagnosis present

## 2018-09-08 DIAGNOSIS — F17210 Nicotine dependence, cigarettes, uncomplicated: Secondary | ICD-10-CM | POA: Diagnosis present

## 2018-09-08 DIAGNOSIS — F419 Anxiety disorder, unspecified: Secondary | ICD-10-CM | POA: Diagnosis present

## 2018-09-08 DIAGNOSIS — J441 Chronic obstructive pulmonary disease with (acute) exacerbation: Principal | ICD-10-CM | POA: Diagnosis present

## 2018-09-08 DIAGNOSIS — Y95 Nosocomial condition: Secondary | ICD-10-CM | POA: Diagnosis not present

## 2018-09-08 DIAGNOSIS — I5022 Chronic systolic (congestive) heart failure: Secondary | ICD-10-CM | POA: Diagnosis present

## 2018-09-08 DIAGNOSIS — Z955 Presence of coronary angioplasty implant and graft: Secondary | ICD-10-CM | POA: Diagnosis not present

## 2018-09-08 DIAGNOSIS — Z95810 Presence of automatic (implantable) cardiac defibrillator: Secondary | ICD-10-CM | POA: Diagnosis not present

## 2018-09-08 DIAGNOSIS — I2510 Atherosclerotic heart disease of native coronary artery without angina pectoris: Secondary | ICD-10-CM | POA: Diagnosis present

## 2018-09-08 DIAGNOSIS — I472 Ventricular tachycardia, unspecified: Secondary | ICD-10-CM

## 2018-09-08 DIAGNOSIS — I255 Ischemic cardiomyopathy: Secondary | ICD-10-CM | POA: Diagnosis present

## 2018-09-08 DIAGNOSIS — Z79899 Other long term (current) drug therapy: Secondary | ICD-10-CM | POA: Diagnosis not present

## 2018-09-08 DIAGNOSIS — Z801 Family history of malignant neoplasm of trachea, bronchus and lung: Secondary | ICD-10-CM | POA: Diagnosis not present

## 2018-09-08 DIAGNOSIS — J440 Chronic obstructive pulmonary disease with acute lower respiratory infection: Secondary | ICD-10-CM | POA: Diagnosis present

## 2018-09-08 DIAGNOSIS — Z800 Family history of malignant neoplasm of digestive organs: Secondary | ICD-10-CM

## 2018-09-08 DIAGNOSIS — E785 Hyperlipidemia, unspecified: Secondary | ICD-10-CM | POA: Diagnosis present

## 2018-09-08 DIAGNOSIS — D72829 Elevated white blood cell count, unspecified: Secondary | ICD-10-CM | POA: Diagnosis not present

## 2018-09-08 DIAGNOSIS — Z23 Encounter for immunization: Secondary | ICD-10-CM | POA: Diagnosis not present

## 2018-09-08 DIAGNOSIS — Z7982 Long term (current) use of aspirin: Secondary | ICD-10-CM

## 2018-09-08 DIAGNOSIS — I110 Hypertensive heart disease with heart failure: Secondary | ICD-10-CM | POA: Diagnosis present

## 2018-09-08 DIAGNOSIS — R0602 Shortness of breath: Secondary | ICD-10-CM | POA: Diagnosis not present

## 2018-09-08 DIAGNOSIS — J189 Pneumonia, unspecified organism: Secondary | ICD-10-CM | POA: Diagnosis not present

## 2018-09-08 DIAGNOSIS — Z8249 Family history of ischemic heart disease and other diseases of the circulatory system: Secondary | ICD-10-CM | POA: Diagnosis not present

## 2018-09-08 DIAGNOSIS — Z720 Tobacco use: Secondary | ICD-10-CM | POA: Diagnosis not present

## 2018-09-08 DIAGNOSIS — N179 Acute kidney failure, unspecified: Secondary | ICD-10-CM | POA: Diagnosis present

## 2018-09-08 LAB — BASIC METABOLIC PANEL
Anion gap: 7 (ref 5–15)
BUN: 36 mg/dL — ABNORMAL HIGH (ref 8–23)
CO2: 27 mmol/L (ref 22–32)
Calcium: 9.2 mg/dL (ref 8.9–10.3)
Chloride: 103 mmol/L (ref 98–111)
Creatinine, Ser: 1.7 mg/dL — ABNORMAL HIGH (ref 0.61–1.24)
GFR calc Af Amer: 48 mL/min — ABNORMAL LOW (ref >60)
GFR calc non Af Amer: 41 mL/min — ABNORMAL LOW (ref >60)
Glucose, Bld: 102 mg/dL — ABNORMAL HIGH (ref 70–99)
Potassium: 3.8 mmol/L (ref 3.5–5.1)
Sodium: 137 mmol/L (ref 135–145)

## 2018-09-08 LAB — CBC
HCT: 40.1 % (ref 39.0–52.0)
Hemoglobin: 11.8 g/dL — ABNORMAL LOW (ref 13.0–17.0)
MCH: 22.9 pg — ABNORMAL LOW (ref 26.0–34.0)
MCHC: 29.4 g/dL — ABNORMAL LOW (ref 30.0–36.0)
MCV: 77.7 fL — ABNORMAL LOW (ref 80.0–100.0)
Platelets: 221 10*3/uL (ref 150–400)
RBC: 5.16 MIL/uL (ref 4.22–5.81)
RDW: 18 % — ABNORMAL HIGH (ref 11.5–15.5)
WBC: 19.7 10*3/uL — ABNORMAL HIGH (ref 4.0–10.5)
nRBC: 0 % (ref 0.0–0.2)

## 2018-09-08 LAB — MAGNESIUM: Magnesium: 2.2 mg/dL (ref 1.7–2.4)

## 2018-09-08 LAB — CREATININE, SERUM
Creatinine, Ser: 1.67 mg/dL — ABNORMAL HIGH (ref 0.61–1.24)
GFR calc Af Amer: 49 mL/min — ABNORMAL LOW (ref >60)
GFR calc non Af Amer: 42 mL/min — ABNORMAL LOW (ref >60)

## 2018-09-08 LAB — TROPONIN I: Troponin I: <0.03 ng/mL (ref <0.03)

## 2018-09-08 MED ORDER — IPRATROPIUM-ALBUTEROL 0.5-2.5 (3) MG/3ML IN SOLN
3.0000 mL | Freq: Once | RESPIRATORY_TRACT | Status: AC
  Administered 2018-09-08: 3 mL via RESPIRATORY_TRACT
  Filled 2018-09-08: qty 3

## 2018-09-08 MED ORDER — MOMETASONE FURO-FORMOTEROL FUM 100-5 MCG/ACT IN AERO
2.0000 | INHALATION_SPRAY | Freq: Two times a day (BID) | RESPIRATORY_TRACT | Status: DC
  Administered 2018-09-08 – 2018-09-14 (×12): 2 via RESPIRATORY_TRACT
  Filled 2018-09-08: qty 8.8

## 2018-09-08 MED ORDER — ONDANSETRON HCL 4 MG PO TABS: 4.0000 mg | ORAL_TABLET | Freq: Four times a day (QID) | ORAL | Status: DC | PRN

## 2018-09-08 MED ORDER — SODIUM CHLORIDE 0.9% FLUSH
3.0000 mL | Freq: Two times a day (BID) | INTRAVENOUS | Status: DC
  Administered 2018-09-09 – 2018-09-13 (×10): 3 mL via INTRAVENOUS

## 2018-09-08 MED ORDER — HYDRALAZINE HCL 20 MG/ML IJ SOLN: 10.0000 mg | Freq: Four times a day (QID) | INTRAMUSCULAR | Status: DC | PRN

## 2018-09-08 MED ORDER — GUAIFENESIN 100 MG/5ML PO SOLN
5.0000 mL | ORAL | Status: DC | PRN
  Administered 2018-09-10 – 2018-09-12 (×4): 100 mg via ORAL
  Filled 2018-09-08 (×4): qty 10

## 2018-09-08 MED ORDER — ALPRAZOLAM 0.5 MG PO TABS
0.5000 mg | ORAL_TABLET | Freq: Every evening | ORAL | Status: DC | PRN
  Administered 2018-09-09 – 2018-09-13 (×6): 0.5 mg via ORAL
  Filled 2018-09-08 (×6): qty 1

## 2018-09-08 MED ORDER — SODIUM CHLORIDE 0.9 % IV SOLN
250.0000 mL | INTRAVENOUS | Status: DC | PRN
  Administered 2018-09-11: 16:00:00 250 mL via INTRAVENOUS

## 2018-09-08 MED ORDER — EZETIMIBE 10 MG PO TABS
10.0000 mg | ORAL_TABLET | Freq: Every day | ORAL | Status: DC
  Administered 2018-09-08 – 2018-09-14 (×7): 10 mg via ORAL
  Filled 2018-09-08 (×7): qty 1

## 2018-09-08 MED ORDER — ACETAMINOPHEN 650 MG RE SUPP: 650.0000 mg | Freq: Four times a day (QID) | RECTAL | Status: DC | PRN

## 2018-09-08 MED ORDER — BISACODYL 5 MG PO TBEC: 5.0000 mg | DELAYED_RELEASE_TABLET | Freq: Every day | ORAL | Status: DC | PRN

## 2018-09-08 MED ORDER — IPRATROPIUM-ALBUTEROL 0.5-2.5 (3) MG/3ML IN SOLN
3.0000 mL | Freq: Four times a day (QID) | RESPIRATORY_TRACT | Status: DC
  Administered 2018-09-08 – 2018-09-10 (×8): 3 mL via RESPIRATORY_TRACT
  Filled 2018-09-08 (×8): qty 3

## 2018-09-08 MED ORDER — ASPIRIN EC 81 MG PO TBEC
81.0000 mg | DELAYED_RELEASE_TABLET | Freq: Every day | ORAL | Status: DC
  Administered 2018-09-08 – 2018-09-14 (×7): 81 mg via ORAL
  Filled 2018-09-08 (×7): qty 1

## 2018-09-08 MED ORDER — ONDANSETRON HCL 4 MG/2ML IJ SOLN: 4.0000 mg | Freq: Four times a day (QID) | INTRAMUSCULAR | Status: DC | PRN

## 2018-09-08 MED ORDER — HEPARIN SODIUM (PORCINE) 5000 UNIT/ML IJ SOLN
5000.0000 [IU] | Freq: Three times a day (TID) | INTRAMUSCULAR | Status: DC
  Administered 2018-09-08 – 2018-09-14 (×17): 5000 [IU] via SUBCUTANEOUS
  Filled 2018-09-08 (×17): qty 1

## 2018-09-08 MED ORDER — HYDROCODONE-ACETAMINOPHEN 5-325 MG PO TABS
1.0000 | ORAL_TABLET | ORAL | Status: DC | PRN
  Administered 2018-09-08: 2 via ORAL
  Administered 2018-09-08: 1 via ORAL
  Administered 2018-09-09 – 2018-09-10 (×3): 2 via ORAL
  Administered 2018-09-10 – 2018-09-11 (×2): 1 via ORAL
  Administered 2018-09-11 – 2018-09-13 (×5): 2 via ORAL
  Filled 2018-09-08: qty 1
  Filled 2018-09-08 (×5): qty 2
  Filled 2018-09-08 (×2): qty 1
  Filled 2018-09-08 (×4): qty 2
  Filled 2018-09-08 (×2): qty 1
  Filled 2018-09-08: qty 2
  Filled 2018-09-08: qty 1

## 2018-09-08 MED ORDER — METHYLPREDNISOLONE SODIUM SUCC 125 MG IJ SOLR
INTRAMUSCULAR | Status: AC
  Administered 2018-09-08: 125 mg via INTRAVENOUS
  Filled 2018-09-08: qty 2

## 2018-09-08 MED ORDER — INFLUENZA VAC SPLIT HIGH-DOSE 0.5 ML IM SUSY
0.5000 mL | PREFILLED_SYRINGE | INTRAMUSCULAR | Status: DC
  Filled 2018-09-08: qty 0.5

## 2018-09-08 MED ORDER — PNEUMOCOCCAL VAC POLYVALENT 25 MCG/0.5ML IJ INJ
0.5000 mL | INJECTION | INTRAMUSCULAR | Status: AC
  Administered 2018-09-09: 0.5 mL via INTRAMUSCULAR
  Filled 2018-09-08: qty 0.5

## 2018-09-08 MED ORDER — SODIUM CHLORIDE 0.9% FLUSH: 3.0000 mL | Freq: Once | INTRAVENOUS | Status: DC

## 2018-09-08 MED ORDER — METHYLPREDNISOLONE SODIUM SUCC 40 MG IJ SOLR
40.0000 mg | Freq: Four times a day (QID) | INTRAMUSCULAR | Status: DC
  Administered 2018-09-08 – 2018-09-09 (×3): 40 mg via INTRAVENOUS
  Filled 2018-09-08 (×3): qty 1

## 2018-09-08 MED ORDER — CARVEDILOL 3.125 MG PO TABS
3.1250 mg | ORAL_TABLET | Freq: Two times a day (BID) | ORAL | Status: DC
  Administered 2018-09-08 – 2018-09-14 (×12): 3.125 mg via ORAL
  Filled 2018-09-08 (×12): qty 1

## 2018-09-08 MED ORDER — ATORVASTATIN CALCIUM 20 MG PO TABS
40.0000 mg | ORAL_TABLET | Freq: Every day | ORAL | Status: DC
  Administered 2018-09-08 – 2018-09-14 (×7): 40 mg via ORAL
  Filled 2018-09-08 (×7): qty 2

## 2018-09-08 MED ORDER — ACETAMINOPHEN 325 MG PO TABS
650.0000 mg | ORAL_TABLET | Freq: Four times a day (QID) | ORAL | Status: DC | PRN
  Administered 2018-09-12: 04:00:00 650 mg via ORAL
  Filled 2018-09-08: qty 2

## 2018-09-08 MED ORDER — SODIUM CHLORIDE 0.9% FLUSH: 3.0000 mL | INTRAVENOUS | Status: DC | PRN

## 2018-09-08 MED ORDER — SENNOSIDES-DOCUSATE SODIUM 8.6-50 MG PO TABS: 1.0000 | ORAL_TABLET | Freq: Every evening | ORAL | Status: DC | PRN

## 2018-09-08 MED ORDER — CLOPIDOGREL BISULFATE 75 MG PO TABS
75.0000 mg | ORAL_TABLET | Freq: Every day | ORAL | Status: DC
  Administered 2018-09-08 – 2018-09-14 (×7): 75 mg via ORAL
  Filled 2018-09-08 (×7): qty 1

## 2018-09-08 MED ORDER — METHYLPREDNISOLONE SODIUM SUCC 125 MG IJ SOLR
125.0000 mg | Freq: Once | INTRAMUSCULAR | Status: AC
  Administered 2018-09-08: 125 mg via INTRAVENOUS

## 2018-09-08 MED ORDER — ALBUTEROL SULFATE (2.5 MG/3ML) 0.083% IN NEBU
2.5000 mg | INHALATION_SOLUTION | RESPIRATORY_TRACT | Status: DC | PRN
  Administered 2018-09-11 – 2018-09-13 (×4): 2.5 mg via RESPIRATORY_TRACT
  Filled 2018-09-08 (×3): qty 3

## 2018-09-08 MED ORDER — AMIODARONE HCL 200 MG PO TABS
300.0000 mg | ORAL_TABLET | Freq: Every day | ORAL | Status: DC
  Administered 2018-09-08 – 2018-09-14 (×7): 300 mg via ORAL
  Filled 2018-09-08 (×8): qty 1

## 2018-09-08 NOTE — ED Provider Notes (Signed)
[facility] Emergency Department Provider Note   ____________________________________________    I have reviewed the triage vital signs and the nursing notes.   HISTORY  Chief Complaint Shortness of Breath and Cough     HPI Ian Mills is a 63 y.o. male who presents with shortness of breath and cough.  Patient reports he was at cardiology clinic, sent over for shortness of breath and wheezing.  Patient notes this is been ongoing for over a week.  He does not smoke but does have a significant smoking history, quit 1 year ago, no diagnosis of COPD in the past.  No fevers.  Review of medical record demonstrates the patient was seen at [facility] 5 days ago and was discharged with prednisone and doxycycline and an albuterol inhaler for wheezing, patient has been taking this without significant improvement.  Past Medical History:  Diagnosis Date  . Coronary artery disease    a. 3 stents to the LAD, 1 to the PDA, and 1 to the OM3; b. cath 06/2013: chronically occluded LAD, patent stent LAD, D1 20%, pLCx 30%, mLCx 20%, pRCA 70% (FFR 0.74) s/p PCI/DES, mRCA 70% s/p PCI/DES, RPDA 50%, EF 20% w/ AK of anterolateral & apical walls, moderately elevated LVEDP  . HLD (hyperlipidemia)   . HTN (hypertension)   . ICD  single,BSX    a. DOI 11/2011; b. S/N# [id number]  . Ischemic cardiomyopathy    a. echo: 05/2014: EF 25-30%, AK of mid-dist ant, apical and dist inf walls, mod LVH, DD, mod dilated LA, mildly dilated RA, mild MR, mild Ao scl w/o stenosis    . Syncope and collapse   . Ventricular tachycardia ([place]) 11/2011   a. s/p AICD implant     Patient Active Problem List   Diagnosis Date Noted  . Acute on chronic systolic CHF (congestive heart failure) ([place]) 06/27/2016  . Dizziness 03/30/2015  . Smoker 09/29/2014  . Syncope 06/01/2014  . Insomnia 06/01/2014  . Single implantable cardioverter-defibrillator-BSx   . Ischemic cardiomyopathy   . Ventricular  tachycardia ([place]) 01/23/2012  . SYNCOPE AND COLLAPSE 09/26/2010  . Hyperlipidemia 02/23/2010  . Hypotension 02/23/2010  . CAD (coronary artery disease) 02/23/2010    Past Surgical History:  Procedure Laterality Date  . appendectomy    . CARDIAC CATHETERIZATION  Nov 30 2011   [facility]  . CARDIAC DEFIBRILLATOR PLACEMENT  May 2013  . CORONARY ANGIOPLASTY  2014   x 2 stents  . CORONARY STENT PLACEMENT     x7  . PROSTATE SURGERY    . TUMOR REMOVAL     chest    Prior to Admission medications   Medication Sig Start Date End Date Taking? Authorizing Provider  amiodarone (PACERONE) 200 MG tablet Take 1.5 tablets (300 mg total) by mouth daily. 05/29/18   Minna Merritts, MD  aspirin 81 MG EC tablet Take 1 tablet (81 mg total) by mouth daily. 09/11/15   Minna Merritts, MD  atorvastatin (LIPITOR) 40 MG tablet Take 1 tablet (40 mg total) by mouth daily. 06/24/18   Minna Merritts, MD  carvedilol (COREG) 3.125 MG tablet Take 1 tablet (3.125 mg total) by mouth 2 (two) times daily. 08/28/18   Minna Merritts, MD  clopidogrel (PLAVIX) 75 MG tablet Take 1 tablet (75 mg total) by mouth daily. 08/31/18   Minna Merritts, MD  ezetimibe (ZETIA) 10 MG tablet Take 1 tablet (10 mg total) by mouth daily. 10/20/17   Gollan,  Kathlene November, MD  furosemide (LASIX) 40 MG tablet Take 1 tablet (40 mg total) by mouth 2 (two) times daily. 08/28/18   Minna Merritts, MD  nitroGLYCERIN (NITROSTAT) 0.4 MG SL tablet Place 1 tablet (0.4 mg total) under the tongue every 5 (five) minutes as needed for chest pain. 06/17/13   Minna Merritts, MD  potassium chloride (KLOR-CON 10) 10 MEQ tablet Take 1 tablet (10 mEq total) by mouth daily. 10/20/17   Minna Merritts, MD  sacubitril-valsartan (ENTRESTO) 97-103 MG Take 1 tablet by mouth 2 (two) times daily. 05/13/18   Minna Merritts, MD  spironolactone (ALDACTONE) 25 MG tablet Take 1 tablet (25 mg total) by mouth daily. 08/24/18   Minna Merritts, MD  tadalafil (CIALIS) 10 MG  tablet Take 1 tablet (10 mg total) by mouth daily as needed for erectile dysfunction (take 30 min prior to when needed.). 12/30/16 11/01/19  Minna Merritts, MD     Allergies Nitroglycerin  Family History  Problem Relation Age of Onset  . Cancer Father        larynx  . Cancer - Lung Mother   . Hypertension Mother   . Heart attack Mother     Social History Social History   Tobacco Use  . Smoking status: Current Every Day Smoker    Packs/day: 0.50    Years: 7.00    Pack years: 3.50    Types: Cigarettes    Last attempt to quit: 11/27/2011    Years since quitting: 6.7  . Smokeless tobacco: Never Used  . Tobacco comment: smoked 1p q 2 days x 6 years   Substance Use Topics  . Alcohol use: Not Currently    Alcohol/week: 12.0 standard drinks    Types: 12 Cans of beer per week    Frequency: Never  . Drug use: No    Review of Systems  Constitutional: No fever/chills Eyes: No visual changes.  ENT: No throat swelling. Cardiovascular: Tightness Respiratory: As above Gastrointestinal: No abdominal pain.  No nausea, no vomiting.   Genitourinary: Negative for dysuria. Musculoskeletal: Negative for back pain. Skin: Negative for rash. Neurological: Negative for headaches   ____________________________________________   PHYSICAL EXAM:  VITAL SIGNS: ED Triage Vitals  Enc Vitals Group     BP 09/08/18 1224 (!) 147/77     Pulse Rate 09/08/18 1224 72     Resp --      Temp 09/08/18 1224 98.4 F (36.9 C)     Temp Source 09/08/18 1224 Oral     SpO2 09/08/18 1224 98 %     Weight 09/08/18 1214 86.2 kg (190 lb)     Height 09/08/18 1214 1.727 m (5\' 8" )     Head Circumference --      Peak Flow --      Pain Score 09/08/18 1214 0     Pain Loc --      Pain Edu? --      Excl. in [place]? --     Constitutional: Alert and oriented. No acute distress.  Eyes: Conjunctivae are normal.   Nose: No congestion/rhinnorhea. Mouth/Throat: Mucous membranes are moist.    Cardiovascular: Normal  rate, regular rhythm. Grossly normal heart sounds.  Good peripheral circulation. Respiratory: Mild tachypnea no retractions.  Diffuse wheezing Gastrointestinal: Soft and nontender. No distention.    Musculoskeletal: No lower extremity tenderness nor edema.  Warm and well perfused Neurologic:  Normal speech and language. No gross focal neurologic deficits are appreciated.  Skin:  Skin is warm, dry and intact. No rash noted. Psychiatric: Mood and affect are normal. Speech and behavior are normal.  ____________________________________________   LABS (all labs ordered are listed, but only abnormal results are displayed)  Labs Reviewed  BASIC METABOLIC PANEL - Abnormal; Notable for the following components:      Result Value   Glucose, Bld 102 (*)    BUN 36 (*)    Creatinine, Ser 1.70 (*)    GFR calc non Af Amer 41 (*)    GFR calc Af Amer 48 (*)    All other components within normal limits  CBC - Abnormal; Notable for the following components:   WBC 19.7 (*)    Hemoglobin 11.8 (*)    MCV 77.7 (*)    MCH 22.9 (*)    MCHC 29.4 (*)    RDW 18.0 (*)    All other components within normal limits  TROPONIN I   ____________________________________________  EKG  None ____________________________________________  RADIOLOGY  Chest x-ray negative for pneumonia ____________________________________________   PROCEDURES  Procedure(s) performed: No  Procedures   Critical Care performed: No ____________________________________________   INITIAL IMPRESSION / ASSESSMENT AND PLAN / ED COURSE  Pertinent labs & imaging results that were available during my care of the patient were reviewed by me and considered in my medical decision making (see chart for details).  Patient with diffuse wheezing on exam consistent with bronchospasm, likely COPD related.  Chest x-ray negative for pneumonia.  Will treat with IV Solu-Medrol, duo nebs and reevaluate, anticipate admission given failed  outpatient treatment.  Elevated white blood cell count likely related to prednisone  ----------------------------------------- 2:50 PM on 09/08/2018 -----------------------------------------   No significant improvement after treatment, have discussed with Dr. Posey Pronto of hospital service for admission    ____________________________________________   FINAL CLINICAL IMPRESSION(S) / ED DIAGNOSES  Final diagnoses:  COPD exacerbation ([place])        Note:  This document was prepared using Dragon voice recognition software and may include unintentional dictation errors.   Lavonia Drafts, MD 09/08/18 1450

## 2018-09-08 NOTE — ED Triage Notes (Signed)
Was sent over from heart care with "asthma" per Dr however pt states he has never been diagnosed with asthma before, has shob and cough for about a week now, NAD.

## 2018-09-08 NOTE — Progress Notes (Signed)
Advanced Care Plan.  Purpose of Encounter: CODE STATUS. Parties in Attendance: The patient, his wife and me. Medical Story:  Ian Mills  is a 63 y.o. male with a known history of CAD s/p stent placement, hypertension, hyperlipidemia, chronic systolic CHF ejection fraction 25% and ischemic cardiomyopathy, s/p AICD implant.  The patient is being admitted for COPD exacerbation and acute renal failure.  I discussed with the patient about his current condition, prognosis and CODE STATUS.  The patient stated that he wants to be resuscitated with CPR, shock and medication if he has cardiopulmonary arrest but he does not want to be intubated. Plan:  Code Status: Limited code. Time spent discussing advance care planning:17 minutes.

## 2018-09-08 NOTE — Patient Instructions (Addendum)
Medication Instructions:  - Your physician recommends that you continue on your current medications as directed. Please refer to the Current Medication list given to you today.  If you need a refill on your cardiac medications before your next appointment, please call your pharmacy.   Lab work: - none ordered  If you have labs (blood work) drawn today and your tests are completely normal, you will receive your results only by: Marland Kitchen MyChart Message (if you have MyChart) OR . A paper copy in the mail If you have any lab test that is abnormal or we need to change your treatment, we will call you to review the results.  Testing/Procedures: - none ordered  Follow-Up: At [facility], you and your health needs are our priority.  As part of our continuing mission to provide you with exceptional heart care, we have created designated Provider Care Teams.  These Care Teams include your primary Cardiologist (physician) and Advanced Practice Providers (APPs -  Physician Assistants and Nurse Practitioners) who all work together to provide you with the care you need, when you need it. . You will need a follow up appointment in 1 year with Dr. Caryl Comes.  Please call our office 2 months in advance to schedule this appointment.    Remote monitoring is used to monitor your Pacemaker of ICD from home. This monitoring reduces the number of office visits required to check your device to one time per year. It allows [place] to keep an eye on the functioning of your device to ensure it is working properly. You are scheduled for a device check from home on 09/15/18. You may send your transmission at any time that day. If you have a wireless device, the transmission will be sent automatically. After your physician reviews your transmission, you will receive a postcard with your next transmission date. .  .   Any Other Special Instructions Will Be Listed Below (If Applicable). - You have been advised to go to the ER today for  increase shortness of breath and possible neb treatment

## 2018-09-08 NOTE — H&P (Signed)
[facility] [place] NAME: Ian Mills    MR#:  [id number]  DATE OF BIRTH:  04-15-1956  DATE OF ADMISSION:  09/08/2018  PRIMARY CARE PHYSICIAN: Patient, No Pcp Per   REQUESTING/REFERRING PHYSICIAN: Dr. Corky Downs.  CHIEF COMPLAINT:   Chief Complaint  Patient presents with  . Shortness of Breath  . Cough   Cough, wheezing and shortness of breath for 1 week, worsening today. HISTORY OF PRESENT ILLNESS:  Ian Mills  is a 63 y.o. male with a known history of CAD s/p stent placement, hypertension, hyperlipidemia, chronic systolic CHF and ischemic cardiomyopathy, s/p AICD implant.  He has no history of COPD.  He complains of worsening cough, wheezing and shortness of breath for the past 1 week.  He was given albuterol, prednisone and doxycycline for 4 days without improvement.  He denies any fever or chills, no orthopnea, nocturnal dyspnea or leg edema.  Chest x-ray is unremarkable.  The patient is treated with DuoNeb and IV Solu-Medrol in the ED but still has wheezing.  Dr. Corky Downs requested admission.  The patient said he is still smoking but quit 1 week ago. PAST MEDICAL HISTORY:   Past Medical History:  Diagnosis Date  . Coronary artery disease    a. 3 stents to the LAD, 1 to the PDA, and 1 to the OM3; b. cath 06/2013: chronically occluded LAD, patent stent LAD, D1 20%, pLCx 30%, mLCx 20%, pRCA 70% (FFR 0.74) s/p PCI/DES, mRCA 70% s/p PCI/DES, RPDA 50%, EF 20% w/ AK of anterolateral & apical walls, moderately elevated LVEDP  . HLD (hyperlipidemia)   . HTN (hypertension)   . ICD  single,BSX    a. DOI 11/2011; b. S/N# [id number]  . Ischemic cardiomyopathy    a. echo: 05/2014: EF 25-30%, AK of mid-dist ant, apical and dist inf walls, mod LVH, DD, mod dilated LA, mildly dilated RA, mild MR, mild Ao scl w/o stenosis    . Syncope and collapse   . Ventricular tachycardia ([place]) 11/2011   a. s/p AICD implant     PAST SURGICAL HISTORY:   Past Surgical  History:  Procedure Laterality Date  . appendectomy    . CARDIAC CATHETERIZATION  Nov 30 2011   [facility]  . CARDIAC DEFIBRILLATOR PLACEMENT  May 2013  . CORONARY ANGIOPLASTY  2014   x 2 stents  . CORONARY STENT PLACEMENT     x7  . PROSTATE SURGERY    . TUMOR REMOVAL     chest    SOCIAL HISTORY:   Social History   Tobacco Use  . Smoking status: Current Every Day Smoker    Packs/day: 0.50    Years: 7.00    Pack years: 3.50    Types: Cigarettes    Last attempt to quit: 11/27/2011    Years since quitting: 6.7  . Smokeless tobacco: Never Used  . Tobacco comment: smoked 1p q 2 days x 6 years   Substance Use Topics  . Alcohol use: Not Currently    Alcohol/week: 12.0 standard drinks    Types: 12 Cans of beer per week    Frequency: Never    FAMILY HISTORY:   Family History  Problem Relation Age of Onset  . Cancer Father        larynx  . Cancer - Lung Mother   . Hypertension Mother   . Heart attack Mother     DRUG ALLERGIES:   Allergies  Allergen Reactions  . Nitroglycerin  Nausea Only and Other (See Comments)    Nauseas Nauseas Per patient "causes server  headache"    REVIEW OF SYSTEMS:   Review of Systems  Constitutional: Negative for chills, fever and malaise/fatigue.  HENT: Negative for sore throat.   Eyes: Negative for blurred vision and double vision.  Respiratory: Positive for cough, shortness of breath and wheezing. Negative for hemoptysis, sputum production and stridor.   Cardiovascular: Negative for chest pain, palpitations, orthopnea and leg swelling.  Gastrointestinal: Negative for abdominal pain, blood in stool, diarrhea, melena, nausea and vomiting.  Genitourinary: Negative for dysuria, flank pain and hematuria.  Musculoskeletal: Negative for back pain and joint pain.  Skin: Negative for rash.  Neurological: Positive for headaches. Negative for dizziness, sensory change, focal weakness, seizures, loss of consciousness and weakness.    Endo/Heme/Allergies: Negative for polydipsia.  Psychiatric/Behavioral: Negative for depression. The patient is not nervous/anxious.     MEDICATIONS AT HOME:   Prior to Admission medications   Medication Sig Start Date End Date Taking? Authorizing Provider  ALPRAZolam Duanne Moron) 0.5 MG tablet Take 0.5 mg by mouth at bedtime as needed for anxiety.   Yes [provider]  amiodarone (PACERONE) 200 MG tablet Take 1.5 tablets (300 mg total) by mouth daily. 05/29/18  Yes Minna Merritts, MD  aspirin 81 MG EC tablet Take 1 tablet (81 mg total) by mouth daily. 09/11/15  Yes Minna Merritts, MD  atorvastatin (LIPITOR) 40 MG tablet Take 1 tablet (40 mg total) by mouth daily. 06/24/18  Yes Minna Merritts, MD  carvedilol (COREG) 3.125 MG tablet Take 1 tablet (3.125 mg total) by mouth 2 (two) times daily. 08/28/18  Yes Minna Merritts, MD  clopidogrel (PLAVIX) 75 MG tablet Take 1 tablet (75 mg total) by mouth daily. 08/31/18  Yes Minna Merritts, MD  ezetimibe (ZETIA) 10 MG tablet Take 1 tablet (10 mg total) by mouth daily. 10/20/17  Yes Gollan, Kathlene November, MD  furosemide (LASIX) 40 MG tablet Take 1 tablet (40 mg total) by mouth 2 (two) times daily. 08/28/18  Yes Gollan, Kathlene November, MD  potassium chloride (KLOR-CON 10) 10 MEQ tablet Take 1 tablet (10 mEq total) by mouth daily. 10/20/17  Yes Gollan, Kathlene November, MD  sacubitril-valsartan (ENTRESTO) 97-103 MG Take 1 tablet by mouth 2 (two) times daily. 05/13/18  Yes Minna Merritts, MD  spironolactone (ALDACTONE) 25 MG tablet Take 1 tablet (25 mg total) by mouth daily. 08/24/18  Yes Minna Merritts, MD  tadalafil (CIALIS) 10 MG tablet Take 1 tablet (10 mg total) by mouth daily as needed for erectile dysfunction (take 30 min prior to when needed.). 12/30/16 11/01/19 Yes Gollan, Kathlene November, MD      VITAL SIGNS:  Blood pressure (!) 147/77, pulse 72, temperature 98.4 F (36.9 C), temperature source Oral, height 5\' 8"  (1.727 m), weight 86.2 kg, SpO2 98  %.  PHYSICAL EXAMINATION:  Physical Exam  GENERAL:  63 y.o.-year-old patient lying in the bed with no acute distress.  EYES: Pupils equal, round, reactive to light and accommodation. No scleral icterus. Extraocular muscles intact.  HEENT: Head atraumatic, normocephalic. Oropharynx and nasopharynx clear.  NECK:  Supple, no jugular venous distention. No thyroid enlargement, no tenderness.  LUNGS: Bilateral moderate expiratory wheezing, no rales,rhonchi or crepitation. No use of accessory muscles of respiration.  CARDIOVASCULAR: S1, S2 normal. No murmurs, rubs, or gallops.  ABDOMEN: Soft, nontender, nondistended. Bowel sounds present. No organomegaly or mass.  EXTREMITIES: No pedal edema, cyanosis, or clubbing.  NEUROLOGIC: Cranial nerves II through XII are intact. Muscle strength 5/5 in all extremities. Sensation intact. Gait not checked.  PSYCHIATRIC: The patient is alert and oriented x 3.  SKIN: No obvious rash, lesion, or ulcer.   LABORATORY PANEL:   CBC Recent Labs  Lab 09/08/18 1220  WBC 19.7*  HGB 11.8*  HCT 40.1  PLT 221   ------------------------------------------------------------------------------------------------------------------  Chemistries  Recent Labs  Lab 09/08/18 1220  NA 137  K 3.8  CL 103  CO2 27  GLUCOSE 102*  BUN 36*  CREATININE 1.70*  CALCIUM 9.2   ------------------------------------------------------------------------------------------------------------------  Cardiac Enzymes Recent Labs  Lab 09/08/18 1220  TROPONINI <0.03   ------------------------------------------------------------------------------------------------------------------  RADIOLOGY:  Dg Chest 2 View  Result Date: 09/08/2018 CLINICAL DATA:  Shortness of breath. EXAM: CHEST - 2 VIEW COMPARISON:  Radiographs of September 04, 2018. FINDINGS: The heart size and mediastinal contours are within normal limits. Both lungs are clear. No pneumothorax or pleural effusion is noted.  Single lead left-sided pacemaker is noted. The visualized skeletal structures are unremarkable. IMPRESSION: No active cardiopulmonary disease. Electronically Signed   By: Marijo Conception, M.D.   On: 09/08/2018 12:58      IMPRESSION AND PLAN:   COPD exacerbation. The patient will be admitted to medical floor. Start DuoNeb every 6 hours, IV Solu-Medrol, Dulera, Robitussin as needed.  Acute renal failure due to dehydration. Hold Lasix, spironolactone and Entresto for now, follow-up BMP to resume these medications.  Leukocytosis.  Possible due to prednisone. CAD.  Continue aspirin, Plavix and statin. History of chronic systolic CHF.  Ejection fraction 25%.  Stable. Hold Lasix, spironolactone and Entresto for now, continue Coreg and amiodarone, follow-up BMP to resume these medications.  Tobacco abuse.  Smoking cessation was counseled for 3 to 4 minutes.  All the records are reviewed and case discussed with ED provider. Management plans discussed with the patient, his wife and they are in agreement.  CODE STATUS: Limited code.  TOTAL TIME TAKING CARE OF THIS PATIENT: 35 minutes.    Demetrios Loll M.D on 09/08/2018 at 3:25 PM  Between 7am to 6pm - Pager - [phone]  After 6pm go to [email or web address] - Proofreader  [facility]  Office  [phone]  CC: Primary care physician; Patient, No Pcp Per   Note: This dictation was prepared with Dragon dictation along with smaller phrase technology. Any transcriptional errors that result from this process are unin

## 2018-09-08 NOTE — Progress Notes (Signed)
Patient Care Team: Patient, No Pcp Per as PCP - General (General Practice) Rockey Situ Kathlene November, MD as Consulting Physician (Cardiology)   HPI  Ian Mills is a 63 y.o. male Seen in followup for ICD implanted May 2013 following presentation with ventricular tachycardia for which he takes amiodarone .    Ischemic heart disease with prior stenting of his LAD PDA and OM last in 2008. Catheterization in May 2013 without further PCI. The details of that evaluation are not immediately available. MRI scanning 2008 demonstrated a large anterolateral region of non-viability ejection fraction was 35%.  Echocardiogram 11/15 demonstrated EF of 20-25% with LAD   Patient denies symptoms of GI intolerance, sun sensitivity, neurological symptoms attributable to amiodarone.  Surveillance laboratories were in normal limits   No chest pain but significantly short of breath.  Went to the [place] ER last week and was given prednisone and inhaler and doxycycline.  His dyspnea has not improved.   Date Cr K TSH LFTs  8/17   1.4 16  5/18  1.05 3.9 2.08 (9/18)    7/19  1.4 3.9        Past Medical History:  Diagnosis Date  . Coronary artery disease    a. 3 stents to the LAD, 1 to the PDA, and 1 to the OM3; b. cath 06/2013: chronically occluded LAD, patent stent LAD, D1 20%, pLCx 30%, mLCx 20%, pRCA 70% (FFR 0.74) s/p PCI/DES, mRCA 70% s/p PCI/DES, RPDA 50%, EF 20% w/ AK of anterolateral & apical walls, moderately elevated LVEDP  . HLD (hyperlipidemia)   . HTN (hypertension)   . ICD  single,BSX    a. DOI 11/2011; b. S/N# [id number]  . Ischemic cardiomyopathy    a. echo: 05/2014: EF 25-30%, AK of mid-dist ant, apical and dist inf walls, mod LVH, DD, mod dilated LA, mildly dilated RA, mild MR, mild Ao scl w/o stenosis    . Syncope and collapse   . Ventricular tachycardia ([place]) 11/2011   a. s/p AICD implant     Past Surgical History:  Procedure Laterality Date  . appendectomy    . CARDIAC CATHETERIZATION   Nov 30 2011   [facility]  . CARDIAC DEFIBRILLATOR PLACEMENT  May 2013  . CORONARY ANGIOPLASTY  2014   x 2 stents  . CORONARY STENT PLACEMENT     x7  . PROSTATE SURGERY    . TUMOR REMOVAL     chest    Current Outpatient Medications  Medication Sig Dispense Refill  . amiodarone (PACERONE) 200 MG tablet Take 1.5 tablets (300 mg total) by mouth daily. 45 tablet 2  . aspirin 81 MG EC tablet Take 1 tablet (81 mg total) by mouth daily. 30 tablet 6  . atorvastatin (LIPITOR) 40 MG tablet Take 1 tablet (40 mg total) by mouth daily. 30 tablet 2  . carvedilol (COREG) 3.125 MG tablet Take 1 tablet (3.125 mg total) by mouth 2 (two) times daily. 180 tablet 3  . clopidogrel (PLAVIX) 75 MG tablet Take 1 tablet (75 mg total) by mouth daily. 30 tablet 6  . ezetimibe (ZETIA) 10 MG tablet Take 1 tablet (10 mg total) by mouth daily. 90 tablet 3  . furosemide (LASIX) 40 MG tablet Take 1 tablet (40 mg total) by mouth 2 (two) times daily. 180 tablet 3  . nitroGLYCERIN (NITROSTAT) 0.4 MG SL tablet Place 1 tablet (0.4 mg total) under the tongue every 5 (five) minutes as needed for chest pain. 25 tablet 3  .  potassium chloride (KLOR-CON 10) 10 MEQ tablet Take 1 tablet (10 mEq total) by mouth daily. 90 tablet 3  . sacubitril-valsartan (ENTRESTO) 97-103 MG Take 1 tablet by mouth 2 (two) times daily. 60 tablet 3  . spironolactone (ALDACTONE) 25 MG tablet Take 1 tablet (25 mg total) by mouth daily. 90 tablet 3  . tadalafil (CIALIS) 10 MG tablet Take 1 tablet (10 mg total) by mouth daily as needed for erectile dysfunction (take 30 min prior to when needed.). 10 tablet 3   No current facility-administered medications for this visit.     Allergies  Allergen Reactions  . Nitroglycerin Nausea Only and Other (See Comments)    Nauseas Nauseas Per patient "causes server  headache"    Review of Systems negative except from HPI and PMH  Physical Exam BP 134/74 (BP Location: Left Arm, Patient Position: Sitting, Cuff Size:  Normal)   Pulse 67   Ht 5\' 8"  (1.727 m)   Wt 211 lb 8 oz (95.9 kg)   BMI 32.16 kg/m  Well developed and well nourished in no acute distress HENT normal Neck supple with JVP-flat Wheezing with tachypnea and coughing Device pocket well healed; without hematoma or erythema.  There is no tethering  Regular rate and rhythm, no  murmur Abd-soft with active BS No Clubbing cyanosis   edema Skin-warm and dry A & Oriented  Grossly normal sensory and motor function   ECG sinus @ 67 18/09/39  Assessment and  Plan  Ventricular tachycardia  Ischemic cardiomyopathy  Congestive heart failure-chronic-systolic  Hypertension  Hyperlipidemia  Implantable defibrillator-. [place]  The patient's device was interrogated.  The information was reviewed. No changes were made in the programming.     Cigarette abuse Stopped  YEAH  Asthmatic event  No intercurrent Ventricular tachycardia  Without symptoms of ischemia  Euvolemic continue current meds  Significant wheezing and without notable improvement despite albuterol and prednisone  With dyspnea will send to ER    Will need cmet and TSH on arrival

## 2018-09-09 LAB — BASIC METABOLIC PANEL
ANION GAP: 7 (ref 5–15)
BUN: 33 mg/dL — ABNORMAL HIGH (ref 8–23)
CO2: 25 mmol/L (ref 22–32)
Calcium: 9 mg/dL (ref 8.9–10.3)
Chloride: 104 mmol/L (ref 98–111)
Creatinine, Ser: 1.45 mg/dL — ABNORMAL HIGH (ref 0.61–1.24)
GFR calc Af Amer: 58 mL/min — ABNORMAL LOW (ref >60)
GFR calc non Af Amer: 50 mL/min — ABNORMAL LOW (ref >60)
Glucose, Bld: 133 mg/dL — ABNORMAL HIGH (ref 70–99)
Potassium: 4.6 mmol/L (ref 3.5–5.1)
Sodium: 136 mmol/L (ref 135–145)

## 2018-09-09 LAB — CBC
HEMATOCRIT: 35.6 % — AB (ref 39.0–52.0)
Hemoglobin: 10.5 g/dL — ABNORMAL LOW (ref 13.0–17.0)
MCH: 22.3 pg — ABNORMAL LOW (ref 26.0–34.0)
MCHC: 29.5 g/dL — ABNORMAL LOW (ref 30.0–36.0)
MCV: 75.6 fL — ABNORMAL LOW (ref 80.0–100.0)
Platelets: 213 10*3/uL (ref 150–400)
RBC: 4.71 MIL/uL (ref 4.22–5.81)
RDW: 17.9 % — ABNORMAL HIGH (ref 11.5–15.5)
WBC: 18.5 10*3/uL — ABNORMAL HIGH (ref 4.0–10.5)
nRBC: 0 % (ref 0.0–0.2)

## 2018-09-09 MED ORDER — SPIRONOLACTONE 25 MG PO TABS
25.0000 mg | ORAL_TABLET | Freq: Every day | ORAL | Status: DC
  Administered 2018-09-09 – 2018-09-14 (×6): 25 mg via ORAL
  Filled 2018-09-09 (×6): qty 1

## 2018-09-09 MED ORDER — FUROSEMIDE 40 MG PO TABS
40.0000 mg | ORAL_TABLET | Freq: Two times a day (BID) | ORAL | Status: DC
  Administered 2018-09-09 – 2018-09-10 (×2): 40 mg via ORAL
  Filled 2018-09-09 (×2): qty 1

## 2018-09-09 MED ORDER — METHYLPREDNISOLONE SODIUM SUCC 40 MG IJ SOLR
40.0000 mg | Freq: Three times a day (TID) | INTRAMUSCULAR | Status: DC
  Administered 2018-09-09 – 2018-09-11 (×6): 40 mg via INTRAVENOUS
  Filled 2018-09-09 (×6): qty 1

## 2018-09-09 MED ORDER — ALPRAZOLAM 0.5 MG PO TABS
0.5000 mg | ORAL_TABLET | Freq: Three times a day (TID) | ORAL | Status: DC | PRN
  Administered 2018-09-09 – 2018-09-14 (×9): 0.5 mg via ORAL
  Filled 2018-09-09 (×9): qty 1

## 2018-09-09 NOTE — Progress Notes (Signed)
[facility] at [place] NAME: Ian Mills    MR#:  [id number]  DATE OF BIRTH:  28-Jun-1956  SUBJECTIVE:  CHIEF COMPLAINT: Patient shortness of breath is okay but anxious.  Still wheezing and reports intermittent episodes of cough  REVIEW OF SYSTEMS:  CONSTITUTIONAL: No fever, fatigue or weakness.  EYES: No blurred or double vision.  EARS, NOSE, AND THROAT: No tinnitus or ear pain.  RESPIRATORY: Has cough, denies shortness of breath, wheezing or hemoptysis.  CARDIOVASCULAR: No chest pain, orthopnea, edema.  GASTROINTESTINAL: No nausea, vomiting, diarrhea or abdominal pain.  GENITOURINARY: No dysuria, hematuria.  ENDOCRINE: No polyuria, nocturia,  HEMATOLOGY: No anemia, easy bruising or bleeding SKIN: No rash or lesion. MUSCULOSKELETAL: No joint pain or arthritis.   NEUROLOGIC: No tingling, numbness, weakness.  PSYCHIATRY: No anxiety or depression.   DRUG ALLERGIES:   Allergies  Allergen Reactions  . Nitroglycerin Nausea Only and Other (See Comments)    Nauseas Nauseas Per patient "causes server  headache"    VITALS:  Blood pressure 139/73, pulse (!) 59, temperature 97.9 F (36.6 C), temperature source Oral, resp. rate (!) 22, height 5\' 8"  (1.727 m), weight 86.2 kg, SpO2 92 %.  PHYSICAL EXAMINATION:  GENERAL:  63 y.o.-year-old patient lying in the bed with no acute distress.  EYES: Pupils equal, round, reactive to light and accommodation. No scleral icterus. Extraocular muscles intact.  HEENT: Head atraumatic, normocephalic. Oropharynx and nasopharynx clear.  NECK:  Supple, no jugular venous distention. No thyroid enlargement, no tenderness.  LUNGS: Moderate breath sounds bilaterally,  wheezing, no rales,rhonchi or crepitation. No use of accessory muscles of respiration.  CARDIOVASCULAR: S1, S2 normal. No murmurs, rubs, or gallops.  ABDOMEN: Soft, nontender, nondistended. Bowel sounds present. No organomegaly or mass.   EXTREMITIES: No pedal edema, cyanosis, or clubbing.  NEUROLOGIC: Awake, alert and oriented x3 sensation intact. Gait not checked.  PSYCHIATRIC: The patient is alert and oriented x 3.  SKIN: No obvious rash, lesion, or ulcer.    LABORATORY PANEL:   CBC Recent Labs  Lab 09/09/18 0458  WBC 18.5*  HGB 10.5*  HCT 35.6*  PLT 213   ------------------------------------------------------------------------------------------------------------------  Chemistries  Recent Labs  Lab 09/08/18 1220 09/09/18 0458  NA 137 136  K 3.8 4.6  CL 103 104  CO2 27 25  GLUCOSE 102* 133*  BUN 36* 33*  CREATININE 1.67*  1.70* 1.45*  CALCIUM 9.2 9.0  MG 2.2  --    ------------------------------------------------------------------------------------------------------------------  Cardiac Enzymes Recent Labs  Lab 09/08/18 1220  TROPONINI <0.03   ------------------------------------------------------------------------------------------------------------------  RADIOLOGY:  Dg Chest 2 View  Result Date: 09/08/2018 CLINICAL DATA:  Shortness of breath. EXAM: CHEST - 2 VIEW COMPARISON:  Radiographs of September 04, 2018. FINDINGS: The heart size and mediastinal contours are within normal limits. Both lungs are clear. No pneumothorax or pleural effusion is noted. Single lead left-sided pacemaker is noted. The visualized skeletal structures are unremarkable. IMPRESSION: No active cardiopulmonary disease. Electronically Signed   By: Marijo Conception, M.D.   On: 09/08/2018 12:58    EKG:   Orders placed or performed in visit on 09/08/18  . EKG 12-Lead    ASSESSMENT AND PLAN:   COPD exacerbation. Clinically stable Continue DuoNeb every 6 hours, IV Solu-Medrol, Dulera, Robitussin as needed.  Acute renal failure due to dehydration. Hold Lasix, spironolactone and Entresto for now, follow-up BMP to resume these medications.  Leukocytosis.  Possible due to prednisone.  CAD.  Continue aspirin,  Plavix and statin.  History of chronic systolic CHF.  Ejection fraction 25%.  Stable. Resume Lasix, spironolactone and hold Entresto for now, continue Coreg and amiodarone, follow-up BMP to resume these medications.  Anxiety Xanax as needed  Tobacco abuse.  Smoking cessation was counseled for 3 to 4 minutes.     All the records are reviewed and case discussed with Care Management/Social Workerr. Management plans discussed with the patient, family and they are in agreement.  CODE STATUS: Partial code  TOTAL TIME TAKING CARE OF THIS PATIENT: 35 minutes.   POSSIBLE D/C IN 1-2 DAYS, DEPENDING ON CLINICAL CONDITION.  Note: This dictation was prepared with Dragon dictation along with smaller phrase technology. Any transcriptional errors that result from this process are unintentional.   Nicholes Mango M.D on 09/09/2018 at 2:16 PM  Between 7am to 6pm - Pager - [phone] After 6pm go to [email or web address] - password [email or web address] [place]  Office  [phone]  CC: Primary care physician; Patient, No Pcp Per

## 2018-09-10 LAB — HIV ANTIBODY (ROUTINE TESTING W REFLEX): HIV Screen 4th Generation wRfx: NONREACTIVE

## 2018-09-10 MED ORDER — IPRATROPIUM-ALBUTEROL 0.5-2.5 (3) MG/3ML IN SOLN
3.0000 mL | Freq: Two times a day (BID) | RESPIRATORY_TRACT | Status: DC
  Administered 2018-09-10: 3 mL via RESPIRATORY_TRACT
  Filled 2018-09-10: qty 3

## 2018-09-10 NOTE — Progress Notes (Addendum)
[place] [facility] [place] NAME: Ian Mills    MR#:  [id number]  DATE OF BIRTH:  1956-06-04  SUBJECTIVE:  CHIEF COMPLAINT: Patient shortness of breath is okay but anxious.  Still on 2 L of oxygen and at home he lives on room air, denies wheezing   REVIEW OF SYSTEMS:  CONSTITUTIONAL: No fever, fatigue or weakness.  EYES: No blurred or double vision.  EARS, NOSE, AND THROAT: No tinnitus or ear pain.  RESPIRATORY: Has cough, denies shortness of breath, wheezing or hemoptysis.  CARDIOVASCULAR: No chest pain, orthopnea, edema.  GASTROINTESTINAL: No nausea, vomiting, diarrhea or abdominal pain.  GENITOURINARY: No dysuria, hematuria.  ENDOCRINE: No polyuria, nocturia,  HEMATOLOGY: No anemia, easy bruising or bleeding SKIN: No rash or lesion. MUSCULOSKELETAL: No joint pain or arthritis.   NEUROLOGIC: No tingling, numbness, weakness.  PSYCHIATRY: No anxiety or depression.   DRUG ALLERGIES:   Allergies  Allergen Reactions  . Nitroglycerin Nausea Only and Other (See Comments)    Nauseas Nauseas Per patient "causes server  headache"    VITALS:  Blood pressure (!) 161/82, pulse 65, temperature 97.6 F (36.4 C), temperature source Oral, resp. rate 20, height 5\' 8"  (1.727 m), weight 86.2 kg, SpO2 98 %.  PHYSICAL EXAMINATION:  GENERAL:  63 y.o.-year-old patient lying in the bed with no acute distress.  EYES: Pupils equal, round, reactive to light and accommodation. No scleral icterus. Extraocular muscles intact.  HEENT: Head atraumatic, normocephalic. Oropharynx and nasopharynx clear.  NECK:  Supple, no jugular venous distention. No thyroid enlargement, no tenderness.  LUNGS: Moderate breath course sounds bilaterally, no wheezing, no rales,rhonchi or crepitation. No use of accessory muscles of respiration.  CARDIOVASCULAR: S1, S2 normal. No murmurs, rubs, or gallops.  ABDOMEN: Soft, nontender, nondistended. Bowel sounds present. No  organomegaly or mass.  EXTREMITIES: No pedal edema, cyanosis, or clubbing.  NEUROLOGIC: Awake, alert and oriented x3 sensation intact. Gait not checked.  PSYCHIATRIC: The patient is alert and oriented x 3.  SKIN: No obvious rash, lesion, or ulcer.    LABORATORY PANEL:   CBC Recent Labs  Lab 09/09/18 0458  WBC 18.5*  HGB 10.5*  HCT 35.6*  PLT 213   ------------------------------------------------------------------------------------------------------------------  Chemistries  Recent Labs  Lab 09/08/18 1220 09/09/18 0458  NA 137 136  K 3.8 4.6  CL 103 104  CO2 27 25  GLUCOSE 102* 133*  BUN 36* 33*  CREATININE 1.67*  1.70* 1.45*  CALCIUM 9.2 9.0  MG 2.2  --    ------------------------------------------------------------------------------------------------------------------  Cardiac Enzymes Recent Labs  Lab 09/08/18 1220  TROPONINI <0.03   ------------------------------------------------------------------------------------------------------------------  RADIOLOGY:  No results found.  EKG:   Orders placed or performed in visit on 09/08/18  . EKG 12-Lead    ASSESSMENT AND PLAN:   COPD exacerbation. Clinically improving Continue DuoNeb every 6 hours, IV Solu-Medrol tapering Dulera, Robitussin as needed. Wean oxygen as tolerated  Acute renal failure due to dehydration. Hold Lasix, spironolactone and Entresto for now, follow-up BMP to resume these medications. Creatinine 1.70-1.67-1.45  Leukocytosis.  Possible due to prednisone.  CAD.  Continue aspirin, Plavix and statin.  History of chronic systolic CHF.  Ejection fraction 25%.  Stable. Resume Lasix, spironolactone and hold Entresto for now, continue Coreg and amiodarone, follow-up BMP to resume these medications.  Anxiety Xanax as needed  Tobacco abuse.  Smoking cessation was counseled for 3 to 4 minutes.     All the records are reviewed and case discussed with  Care Management/Social  Workerr. Management plans discussed with the patient, family and they are in agreement.  CODE STATUS: Partial code  TOTAL TIME TAKING CARE OF THIS PATIENT: 35 minutes.   POSSIBLE D/C IN 1- DAYS, DEPENDING ON CLINICAL CONDITION.  Note: This dictation was prepared with Dragon dictation along with smaller phrase technology. Any transcriptional errors that result from this process are unintentional.   Nicholes Mango M.D on 09/10/2018 at 2:49 PM  Between 7am to 6pm - Pager - [phone] After 6pm go to [email or web address] - password [email or web address] [facility]  Office  [phone]  CC: Primary care physician; Patient, No Pcp Per

## 2018-09-10 NOTE — Plan of Care (Signed)

## 2018-09-11 ENCOUNTER — Inpatient Hospital Stay: Payer: Medicare Other

## 2018-09-11 ENCOUNTER — Telehealth: Payer: Self-pay | Admitting: Cardiovascular Disease

## 2018-09-11 LAB — BASIC METABOLIC PANEL
Anion gap: 9 (ref 5–15)
BUN: 46 mg/dL — ABNORMAL HIGH (ref 8–23)
CHLORIDE: 100 mmol/L (ref 98–111)
CO2: 25 mmol/L (ref 22–32)
Calcium: 8.6 mg/dL — ABNORMAL LOW (ref 8.9–10.3)
Creatinine, Ser: 1.45 mg/dL — ABNORMAL HIGH (ref 0.61–1.24)
GFR calc Af Amer: 58 mL/min — ABNORMAL LOW (ref >60)
GFR calc non Af Amer: 50 mL/min — ABNORMAL LOW (ref >60)
Glucose, Bld: 126 mg/dL — ABNORMAL HIGH (ref 70–99)
Potassium: 4.3 mmol/L (ref 3.5–5.1)
Sodium: 134 mmol/L — ABNORMAL LOW (ref 135–145)

## 2018-09-11 LAB — TROPONIN I: Troponin I: <0.03 ng/mL (ref <0.03)

## 2018-09-11 MED ORDER — SODIUM CHLORIDE 0.9 % IV SOLN
2.0000 g | Freq: Three times a day (TID) | INTRAVENOUS | Status: DC
  Administered 2018-09-11 – 2018-09-14 (×9): 2 g via INTRAVENOUS
  Filled 2018-09-11 (×12): qty 2

## 2018-09-11 MED ORDER — IPRATROPIUM-ALBUTEROL 0.5-2.5 (3) MG/3ML IN SOLN
3.0000 mL | Freq: Three times a day (TID) | RESPIRATORY_TRACT | Status: DC
  Administered 2018-09-11 – 2018-09-12 (×5): 3 mL via RESPIRATORY_TRACT
  Filled 2018-09-11 (×5): qty 3

## 2018-09-11 MED ORDER — FUROSEMIDE 40 MG PO TABS
40.0000 mg | ORAL_TABLET | Freq: Two times a day (BID) | ORAL | Status: DC
  Administered 2018-09-11 – 2018-09-14 (×6): 40 mg via ORAL
  Filled 2018-09-11 (×6): qty 1

## 2018-09-11 MED ORDER — VANCOMYCIN HCL 10 G IV SOLR
2000.0000 mg | Freq: Once | INTRAVENOUS | Status: AC
  Administered 2018-09-11: 17:00:00 2000 mg via INTRAVENOUS
  Filled 2018-09-11 (×2): qty 2000

## 2018-09-11 MED ORDER — METHYLPREDNISOLONE SODIUM SUCC 40 MG IJ SOLR: 40.0000 mg | INTRAMUSCULAR | Status: DC

## 2018-09-11 MED ORDER — VANCOMYCIN HCL 10 G IV SOLR
1500.0000 mg | INTRAVENOUS | Status: DC
  Filled 2018-09-11: qty 1500

## 2018-09-11 NOTE — Telephone Encounter (Signed)
Will wait to refill since patient is in the hospital

## 2018-09-11 NOTE — Telephone Encounter (Signed)
Please advise if you would like me to go ahead or hold off on [place] refilling this medication.  Patient is currently admitted in the hospital.

## 2018-09-11 NOTE — Progress Notes (Signed)
[place] at [place] NAME: Ian Mills    MR#:  [id number]  DATE OF BIRTH:  1956/04/09  SUBJECTIVE:  CHIEF COMPLAINT: Patient is not feeling better today , reports cough is getting worse and diffuse chest pain denies wheezing   REVIEW OF SYSTEMS:  CONSTITUTIONAL: No fever, fatigue or weakness.  EYES: No blurred or double vision.  EARS, NOSE, AND THROAT: No tinnitus or ear pain.  RESPIRATORY: Has worsening of cough, denies shortness of breath, wheezing or hemoptysis.  CARDIOVASCULAR: No chest pain, orthopnea, edema.  GASTROINTESTINAL: No nausea, vomiting, diarrhea or abdominal pain.  GENITOURINARY: No dysuria, hematuria.  ENDOCRINE: No polyuria, nocturia,  HEMATOLOGY: No anemia, easy bruising or bleeding SKIN: No rash or lesion. MUSCULOSKELETAL: No joint pain or arthritis.   NEUROLOGIC: No tingling, numbness, weakness.  PSYCHIATRY: No anxiety or depression.   DRUG ALLERGIES:   Allergies  Allergen Reactions  . Nitroglycerin Nausea Only and Other (See Comments)    Nauseas Nauseas Per patient "causes server  headache"    VITALS:  Blood pressure 129/62, pulse (!) 59, temperature (!) 97.4 F (36.3 C), temperature source Oral, resp. rate 20, height 5\' 8"  (1.727 m), weight 86.2 kg, SpO2 95 %.  PHYSICAL EXAMINATION:  GENERAL:  63 y.o.-year-old patient lying in the bed with no acute distress.  EYES: Pupils equal, round, reactive to light and accommodation. No scleral icterus. Extraocular muscles intact.  HEENT: Head atraumatic, normocephalic. Oropharynx and nasopharynx clear.  NECK:  Supple, no jugular venous distention. No thyroid enlargement, no tenderness.  LUNGS: Moderate breath course sounds bilaterally, positive crepitation no wheezing, no rales,rhonchi or crepitation. No use of accessory muscles of respiration.  CARDIOVASCULAR: S1, S2 normal. No murmurs, rubs, or gallops.  ABDOMEN: Soft, nontender, nondistended. Bowel  sounds present. EXTREMITIES: No pedal edema, cyanosis, or clubbing.  NEUROLOGIC: Awake, alert and oriented x3 sensation intact. Gait not checked.  PSYCHIATRIC: The patient is alert and oriented x 3.  SKIN: No obvious rash, lesion, or ulcer.    LABORATORY PANEL:   CBC Recent Labs  Lab 09/09/18 0458  WBC 18.5*  HGB 10.5*  HCT 35.6*  PLT 213   ------------------------------------------------------------------------------------------------------------------  Chemistries  Recent Labs  Lab 09/08/18 1220  09/11/18 0626  NA 137   < > 134*  K 3.8   < > 4.3  CL 103   < > 100  CO2 27   < > 25  GLUCOSE 102*   < > 126*  BUN 36*   < > 46*  CREATININE 1.67*  1.70*   < > 1.45*  CALCIUM 9.2   < > 8.6*  MG 2.2  --   --    < > = values in this interval not displayed.   ------------------------------------------------------------------------------------------------------------------  Cardiac Enzymes Recent Labs  Lab 09/11/18 1038  TROPONINI <0.03   ------------------------------------------------------------------------------------------------------------------  RADIOLOGY:  Ct Chest Wo Contrast  Result Date: 09/11/2018 CLINICAL DATA:  Shortness of breath. EXAM: CT CHEST WITHOUT CONTRAST TECHNIQUE: Multidetector CT imaging of the chest was performed following the standard protocol without IV contrast. COMPARISON:  Radiographs of September 08, 2018. FINDINGS: Cardiovascular: Atherosclerosis of thoracic aorta is noted without aneurysm formation. Normal cardiac size. No pericardial effusion is noted. Left-sided pacemaker is unchanged in position. Mediastinum/Nodes: No enlarged mediastinal or axillary lymph nodes. Thyroid gland, trachea, and esophagus demonstrate no significant findings. Lungs/Pleura: No pneumothorax or pleural effusion is noted. Mosaic pattern is noted throughout both lungs which may represent multifocal inflammation or  possibly air trapping secondary to small airways  disease. Upper Abdomen: No acute abnormality. Musculoskeletal: No chest wall mass or suspicious bone lesions identified. IMPRESSION: Mosaic pattern is noted throughout both lungs which may represent multifocal pneumonia or possibly air trapping secondary to small airways disease. Clinical correlation is recommended. Aortic Atherosclerosis (ICD10-I70.0). Electronically Signed   By: Marijo Conception, M.D.   On: 09/11/2018 12:09    EKG:   Orders placed or performed in visit on 09/08/18  . EKG 12-Lead    ASSESSMENT AND PLAN:   Healthcare associated pneumonia CT chest with multifocal pneumonia or possibly a trapping secondary to small airway disease Patient is started on IV cefepime and vancomycin Bronchodilator treatments Pulmonology consult will be considered if no improvement clinically  COPD exacerbation. Clinically improving Continue DuoNeb every 6 hours, IV Solu-Medrol tapering Dulera, Robitussin as needed. Wean oxygen as tolerated  Acute renal failure due to dehydration. Slowly improving, follow-up BMP  Creatinine 1.70-1.67-1.45  CAD.  Continue aspirin, Plavix and statin.  History of chronic systolic CHF.  Ejection fraction 25%.  Stable. Resume Lasix, spironolactone and hold Entresto for now, continue Coreg and amiodarone, follow-up BMP to resume these medications.  Anxiety Xanax as needed  Tobacco abuse.  Smoking cessation was counseled for 3 to 4 minutes.     All the records are reviewed and case discussed with Care Management/Social Workerr. Management plans discussed with the patient, family and they are in agreement.  CODE STATUS: Partial code  TOTAL TIME TAKING CARE OF THIS PATIENT: 35 minutes.   POSSIBLE D/C IN 1-2  DAYS, DEPENDING ON CLINICAL CONDITION.  Note: This dictation was prepared with Dragon dictation along with smaller phrase technology. Any transcriptional errors that result from this process are unintentional.   Nicholes Mango M.D on 09/11/2018 at  1:28 PM  Between 7am to 6pm - Pager - [phone] After 6pm go to [email or web address] - password [email or web address] [facility]  Office  [phone]  CC: Primary care physician; Patient, No Pcp Per

## 2018-09-11 NOTE — Care Management Important Message (Signed)
Important Message  Patient Details  Name: Ian Mills MRN: [id number] Date of Birth: 04-29-1956   Medicare Important Message Given:  Yes    Juliann Pulse A Takhia Spoon 09/11/2018, 11:13 AM

## 2018-09-11 NOTE — Telephone Encounter (Signed)
°*  STAT* If patient is at the pharmacy, call can be transferred to refill team.   1. Which medications need to be refilled? (please list name of each medication and dose if known) potassium chloride 10 MEQ 1 tablet daily   2. Which pharmacy/location (including street and city if local pharmacy) is medication to be sent to? [facility] in [place]  3. Do they need a 30 day or 90 day supply? 90 day

## 2018-09-11 NOTE — Plan of Care (Signed)

## 2018-09-11 NOTE — Progress Notes (Signed)
Pharmacy Antibiotic Note  Ian Mills is a 63 y.o. male admitted on 09/08/2018 with pneumonia.  Pharmacy has been consulted for Vancomycin and Cefepime dosing.  Plan: Cefepime 2g IV q8h  Vancomycin 2g IV x 1 loading dose followed by 1500mg  IV q24h. Predicted AUC 534 Predicted Cmin 13.2 mcg/ml T1/2 15 hr, SCr 1.45 used for calculation  Height: 5\' 8"  (172.7 cm) Weight: 190 lb (86.2 kg) IBW/kg (Calculated) : 68.4  Temp (24hrs), Avg:97.7 F (36.5 C), Min:97.4 F (36.3 C), Max:98.1 F (36.7 C)  Recent Labs  Lab 09/08/18 1220 09/09/18 0458 09/11/18 0626  WBC 19.7* 18.5*  --   CREATININE 1.67*  1.70* 1.45* 1.45*    Estimated Creatinine Clearance: 54.2 mL/min (A) (by C-G formula based on SCr of 1.45 mg/dL (H)).    Allergies  Allergen Reactions  . Nitroglycerin Nausea Only and Other (See Comments)    Nauseas Nauseas Per patient "causes server  headache"    Antimicrobials this admission: Vancomycin 2/14 >>  Cefepime 2/14 >>   Thank you for allowing pharmacy to be a part of this patient's care.  Paulina Fusi, PharmD, BCPS 09/11/2018 2:14 PM

## 2018-09-11 NOTE — Telephone Encounter (Signed)
Do not refill because of potential changes in his medications.

## 2018-09-12 LAB — BASIC METABOLIC PANEL WITH GFR
Anion gap: 7 (ref 5–15)
BUN: 41 mg/dL — ABNORMAL HIGH (ref 8–23)
CO2: 26 mmol/L (ref 22–32)
Calcium: 8.6 mg/dL — ABNORMAL LOW (ref 8.9–10.3)
Chloride: 101 mmol/L (ref 98–111)
Creatinine, Ser: 1.42 mg/dL — ABNORMAL HIGH (ref 0.61–1.24)
GFR calc Af Amer: 60 mL/min — ABNORMAL LOW
GFR calc non Af Amer: 51 mL/min — ABNORMAL LOW
Glucose, Bld: 113 mg/dL — ABNORMAL HIGH (ref 70–99)
Potassium: 4 mmol/L (ref 3.5–5.1)
Sodium: 134 mmol/L — ABNORMAL LOW (ref 135–145)

## 2018-09-12 MED ORDER — FUROSEMIDE 10 MG/ML IJ SOLN
40.0000 mg | Freq: Once | INTRAMUSCULAR | Status: AC
  Administered 2018-09-12: 40 mg via INTRAVENOUS
  Filled 2018-09-12: qty 4

## 2018-09-12 MED ORDER — IPRATROPIUM-ALBUTEROL 0.5-2.5 (3) MG/3ML IN SOLN
3.0000 mL | RESPIRATORY_TRACT | Status: DC
  Administered 2018-09-12 – 2018-09-14 (×6): 3 mL via RESPIRATORY_TRACT
  Filled 2018-09-12 (×6): qty 3

## 2018-09-12 MED ORDER — METHYLPREDNISOLONE SODIUM SUCC 125 MG IJ SOLR
60.0000 mg | INTRAMUSCULAR | Status: DC
  Administered 2018-09-12 – 2018-09-14 (×3): 60 mg via INTRAVENOUS
  Filled 2018-09-12 (×3): qty 2

## 2018-09-12 NOTE — Progress Notes (Signed)
Gave and went over instructions for flutter valve.

## 2018-09-12 NOTE — Progress Notes (Signed)
[place] at [place] NAME: Ian Mills    MR#:  [id number]  DATE OF BIRTH:  Mar 22, 1956  SUBJECTIVE:  : Patient is not feeling better today , reports cough is getting worse and diffuse chest pain denies wheezing  ambulating in the hallways with his wife.  REVIEW OF SYSTEMS:  CONSTITUTIONAL: No fever, fatigue or weakness.  EYES: No blurred or double vision.  EARS, NOSE, AND THROAT: No tinnitus or ear pain.  RESPIRATORY: Has  cough, denies shortness of breath, wheezing or hemoptysis.  CARDIOVASCULAR: No chest pain, orthopnea, edema.  GASTROINTESTINAL: No nausea, vomiting, diarrhea or abdominal pain.  GENITOURINARY: No dysuria, hematuria.  ENDOCRINE: No polyuria, nocturia,  HEMATOLOGY: No anemia, easy bruising or bleeding SKIN: No rash or lesion. MUSCULOSKELETAL: No joint pain or arthritis.   NEUROLOGIC: No tingling, numbness, weakness.  PSYCHIATRY: No anxiety or depression.   DRUG ALLERGIES:   Allergies  Allergen Reactions  . Nitroglycerin Nausea Only and Other (See Comments)    Nauseas Nauseas Per patient "causes server  headache"    VITALS:  Blood pressure 130/68, pulse 61, temperature 97.7 F (36.5 C), temperature source Oral, resp. rate 20, height 5\' 8"  (1.727 m), weight 86.2 kg, SpO2 97 %.  PHYSICAL EXAMINATION:  GENERAL:  63 y.o.-year-old patient lying in the bed with no acute distress.  EYES: Pupils equal, round, reactive to light and accommodation. No scleral icterus. Extraocular muscles intact.  HEENT: Head atraumatic, normocephalic. Oropharynx and nasopharynx clear.  NECK:  Supple, no jugular venous distention. No thyroid enlargement, no tenderness.  LUNGS: Moderate breath course sounds bilaterally, positive crepitation no wheezing, no rales,rhonchi or crepitation. No use of accessory muscles of respiration.  CARDIOVASCULAR: S1, S2 normal. No murmurs, rubs, or gallops.  ABDOMEN: Soft, nontender, nondistended.  Bowel sounds present. EXTREMITIES: No pedal edema, cyanosis, or clubbing.  NEUROLOGIC: Awake, alert and oriented x3 sensation intact. Gait not checked.  PSYCHIATRIC: The patient is alert and oriented x 3.  SKIN: No obvious rash, lesion, or ulcer.    LABORATORY PANEL:   CBC Recent Labs  Lab 09/09/18 0458  WBC 18.5*  HGB 10.5*  HCT 35.6*  PLT 213   ------------------------------------------------------------------------------------------------------------------  Chemistries  Recent Labs  Lab 09/08/18 1220  09/12/18 0554  NA 137   < > 134*  K 3.8   < > 4.0  CL 103   < > 101  CO2 27   < > 26  GLUCOSE 102*   < > 113*  BUN 36*   < > 41*  CREATININE 1.67*  1.70*   < > 1.42*  CALCIUM 9.2   < > 8.6*  MG 2.2  --   --    < > = values in this interval not displayed.   ------------------------------------------------------------------------------------------------------------------  Cardiac Enzymes Recent Labs  Lab 09/11/18 1038  TROPONINI <0.03   ------------------------------------------------------------------------------------------------------------------  RADIOLOGY:  Ct Chest Wo Contrast  Result Date: 09/11/2018 CLINICAL DATA:  Shortness of breath. EXAM: CT CHEST WITHOUT CONTRAST TECHNIQUE: Multidetector CT imaging of the chest was performed following the standard protocol without IV contrast. COMPARISON:  Radiographs of September 08, 2018. FINDINGS: Cardiovascular: Atherosclerosis of thoracic aorta is noted without aneurysm formation. Normal cardiac size. No pericardial effusion is noted. Left-sided pacemaker is unchanged in position. Mediastinum/Nodes: No enlarged mediastinal or axillary lymph nodes. Thyroid gland, trachea, and esophagus demonstrate no significant findings. Lungs/Pleura: No pneumothorax or pleural effusion is noted. Mosaic pattern is noted throughout both lungs which may represent  multifocal inflammation or possibly air trapping secondary to small airways  disease. Upper Abdomen: No acute abnormality. Musculoskeletal: No chest wall mass or suspicious bone lesions identified. IMPRESSION: Mosaic pattern is noted throughout both lungs which may represent multifocal pneumonia or possibly air trapping secondary to small airways disease. Clinical correlation is recommended. Aortic Atherosclerosis (ICD10-I70.0). Electronically Signed   By: Marijo Conception, M.D.   On: 09/11/2018 12:09    EKG:   Orders placed or performed in visit on 09/08/18  . EKG 12-Lead    ASSESSMENT AND PLAN:   *Multifocal pneumonia -CT chest with multifocal pneumonia or possibly a trapping secondary to small airway disease -Patient is started on IV cefepime  -Bronchodilator treatments -Pulmonology consult will be considered if no improvement clinically nebulizer, flutter valve, incentive spirometer -assess for home oxygen if needed  *acute on chronic COPD exacerbation. -Clinically improving slowly -Continue DuoNeb every 6 hours, IV Solu-Medrol tapering Dulera, Robitussin as needed. -Wean oxygen as tolerated  *Acute renal failure due to dehydration. -Slowly improving, follow-up BMP  -Creatinine 1.70-1.67-1.45  *CAD.   -Continue aspirin, Plavix and statin.  *History of chronic systolic CHF.  - Ejection fraction 25%.  Stable. -Patient has AICD -Resume Lasix, spironolactone and hold Entresto for now, continue Coreg and amiodarone, follow-up BMP to resume these medications.  *Anxiety Xanax as needed  *Tobacco abuse.  Smoking cessation was counseled for 3 to 4 minutes.  Discussed with patient and wife   Management plans discussed with the patient, family and they are in agreement.  CODE STATUS: Partial code  TOTAL TIME TAKING CARE OF THIS PATIENT: 35 minutes.   POSSIBLE D/C IN 1-2  DAYS, DEPENDING ON CLINICAL CONDITION.  Note: This dictation was prepared with Dragon dictation along with smaller phrase technology. Any transcriptional errors that result from  this process are unintentional.   Fritzi Mandes M.D on 09/12/2018 at 1:48 PM  Between 7am to 6pm - Pager - [phone] After 6pm go to [email or web address] - password [email or web address] [facility]  Office  [phone]  CC: Primary care physician; Patient, No Pcp Per

## 2018-09-13 NOTE — Progress Notes (Signed)
[place] at [place] NAME: Ian Mills    MR#:  [id number]  DATE OF BIRTH:  1956-04-12  SUBJECTIVE:   Patient is feeling some what better today Having headache  REVIEW OF SYSTEMS:  CONSTITUTIONAL: No fever, fatigue or weakness.  EYES: No blurred or double vision.  EARS, NOSE, AND THROAT: No tinnitus or ear pain.  RESPIRATORY: Has  cough, denies shortness of breath, wheezing or hemoptysis.  CARDIOVASCULAR: No chest pain, orthopnea, edema.  GASTROINTESTINAL: No nausea, vomiting, diarrhea or abdominal pain.  GENITOURINARY: No dysuria, hematuria.  ENDOCRINE: No polyuria, nocturia,  HEMATOLOGY: No anemia, easy bruising or bleeding SKIN: No rash or lesion. MUSCULOSKELETAL: No joint pain or arthritis.   NEUROLOGIC: No tingling, numbness, weakness.  PSYCHIATRY: No anxiety or depression.   DRUG ALLERGIES:   Allergies  Allergen Reactions  . Nitroglycerin Nausea Only and Other (See Comments)    Nauseas Nauseas Per patient "causes server  headache"    VITALS:  Blood pressure 118/61, pulse 69, temperature 98 F (36.7 C), temperature source Oral, resp. rate 16, height 5\' 8"  (1.727 m), weight 86.2 kg, SpO2 91 %.  PHYSICAL EXAMINATION:  GENERAL:  63 y.o.-year-old patient lying in the bed with no acute distress.  EYES: Pupils equal, round, reactive to light and accommodation. No scleral icterus. Extraocular muscles intact.  HEENT: Head atraumatic, normocephalic. Oropharynx and nasopharynx clear.  NECK:  Supple, no jugular venous distention. No thyroid enlargement, no tenderness.  LUNGS: Moderate breath coarse sounds bilaterally, positive crepitation no wheezing, no rales,rhonchi or crepitation. No use of accessory muscles of respiration.  CARDIOVASCULAR: S1, S2 normal. No murmurs, rubs, or gallops.  ABDOMEN: Soft, nontender, nondistended. Bowel sounds present. EXTREMITIES: No pedal edema, cyanosis, or clubbing.  NEUROLOGIC: Awake,  alert and oriented x3 sensation intact. Gait not checked.  PSYCHIATRIC: The patient is alert and oriented x 3.  SKIN: No obvious rash, lesion, or ulcer.    LABORATORY PANEL:   CBC Recent Labs  Lab 09/09/18 0458  WBC 18.5*  HGB 10.5*  HCT 35.6*  PLT 213   ------------------------------------------------------------------------------------------------------------------  Chemistries  Recent Labs  Lab 09/08/18 1220  09/12/18 0554  NA 137   < > 134*  K 3.8   < > 4.0  CL 103   < > 101  CO2 27   < > 26  GLUCOSE 102*   < > 113*  BUN 36*   < > 41*  CREATININE 1.67*  1.70*   < > 1.42*  CALCIUM 9.2   < > 8.6*  MG 2.2  --   --    < > = values in this interval not displayed.   ------------------------------------------------------------------------------------------------------------------  Cardiac Enzymes Recent Labs  Lab 09/11/18 1038  TROPONINI <0.03   ------------------------------------------------------------------------------------------------------------------  RADIOLOGY:  No results found.  EKG:   Orders placed or performed in visit on 09/08/18  . EKG 12-Lead    ASSESSMENT AND PLAN:   *Multifocal pneumonia -CT chest with multifocal pneumonia or possibly a trapping secondary to small airway disease -Patient is  on IV cefepime , IV steroids -Bronchodilator treatments -Pulmonology consult will be considered if no improvement clinically--will set up ot pt appt -nebulizer, flutter valve, incentive spirometer -assess for home oxygen if needed  *acute on chronic COPD exacerbation. -Clinically improving slowly -Continue DuoNeb every 6 hours, IV Solu-Medrol tapering Dulera, Robitussin as needed. -Wean oxygen as tolerated  *Acute renal failure due to dehydration. -Slowly improving, follow-up BMP  -Creatinine 1.70-1.67-1.45  *  CAD.   -Continue aspirin, Plavix and statin.  *History of chronic systolic CHF.  - Ejection fraction 25%.  Stable. -Patient has  AICD -Resume Lasix, spironolactone and hold Entresto for now, continue Coreg and amiodarone, follow-up BMP to resume these medications.  *Anxiety Xanax as needed  *Tobacco abuse.  Smoking cessation was counseled for 3 to 4 minutes.  Discussed with patient and wife  Will assess for home oxygen use. If continues to show improvement likely discharged home tomorrow patient. Patient and wife agrees. Management plans discussed with the patient, family and they are in agreement.  CODE STATUS: Partial code  TOTAL TIME TAKING CARE OF THIS PATIENT: 35 minutes.   POSSIBLE D/C IN 1-2  DAYS, DEPENDING ON CLINICAL CONDITION.  Note: This dictation was prepared with Dragon dictation along with smaller phrase technology. Any transcriptional errors that result from this process are unintentional.   Fritzi Mandes M.D on 09/13/2018 at 2:04 PM  Between 7am to 6pm - Pager - [phone] After 6pm go to [email or web address] - password [email or web address] [facility]  Office  [phone]  CC: Primary care physician; Patient, No Pcp Per

## 2018-09-13 NOTE — Progress Notes (Signed)
Pastoral Care Visit for [place]    09/13/18 1540  Clinical Encounter Type  Visited With Patient and family together (wife present)  Visit Type Initial;Other (Comment) (HCPOA)  Referral From Nurse  Consult/Referral To Chaplain  Recommendations  (follow up on Monday morning to notarize HCPOA for pt and wif)   Chap met w/ pt and family to educate about HCPOA.  Explained and left ppwk with pt.  Pt desires for a chap to return to complete ppwk with notary first thing on Monday morning as pt is planning to be discharged on Monday. Pt and spouse wish to complete  Darcey Nora, Chaplain

## 2018-09-14 MED ORDER — CEFDINIR 300 MG PO CAPS
300.0000 mg | ORAL_CAPSULE | Freq: Two times a day (BID) | ORAL | Status: DC
  Filled 2018-09-14 (×2): qty 1

## 2018-09-14 MED ORDER — ALPRAZOLAM 0.5 MG PO TABS: 0.5000 mg | ORAL_TABLET | Freq: Three times a day (TID) | ORAL | 0 refills | Status: DC | PRN

## 2018-09-14 MED ORDER — PREDNISONE 10 MG PO TABS: ORAL_TABLET | ORAL | 0 refills | Status: DC

## 2018-09-14 MED ORDER — IPRATROPIUM-ALBUTEROL 0.5-2.5 (3) MG/3ML IN SOLN
3.0000 mL | Freq: Four times a day (QID) | RESPIRATORY_TRACT | Status: DC
  Filled 2018-09-14: qty 3

## 2018-09-14 MED ORDER — MOMETASONE FURO-FORMOTEROL FUM 100-5 MCG/ACT IN AERO: 2.0000 | INHALATION_SPRAY | Freq: Two times a day (BID) | RESPIRATORY_TRACT | 1 refills | Status: DC

## 2018-09-14 MED ORDER — PREDNISONE 50 MG PO TABS: 50.0000 mg | ORAL_TABLET | Freq: Every day | ORAL | Status: DC

## 2018-09-14 MED ORDER — CEFDINIR 300 MG PO CAPS: 300.0000 mg | ORAL_CAPSULE | Freq: Two times a day (BID) | ORAL | 0 refills | Status: DC

## 2018-09-14 MED ORDER — IPRATROPIUM-ALBUTEROL 0.5-2.5 (3) MG/3ML IN SOLN: 3.0000 mL | Freq: Four times a day (QID) | RESPIRATORY_TRACT | 0 refills | Status: DC | PRN

## 2018-09-14 MED ORDER — SACUBITRIL-VALSARTAN 97-103 MG PO TABS
1.0000 | ORAL_TABLET | Freq: Two times a day (BID) | ORAL | Status: DC
  Filled 2018-09-14 (×2): qty 1

## 2018-09-14 NOTE — Progress Notes (Signed)
SATURATION QUALIFICATIONS: (This note is used to comply with regulatory documentation for home oxygen)  Patient Saturations on Room Air at Rest = 88%  Patient Saturations on Room Air while Ambulating = 78%  Patient Saturations on 2 Liters of oxygen while Ambulating = 94%  Please briefly explain why patient needs home oxygen: COPD

## 2018-09-14 NOTE — Care Management Important Message (Signed)
Important Message  Patient Details  Name: Ian Mills MRN: [id number] Date of Birth: Oct 14, 1955   Medicare Important Message Given:  Yes    Juliann Pulse A Isham Smitherman 09/14/2018, 11:41 AM

## 2018-09-14 NOTE — Progress Notes (Signed)
   09/14/18 1000  Clinical Encounter Type  Visited With Patient and family together  Visit Type Follow-up  Ch helped the pt complete the advance directives. The document is on file.

## 2018-09-14 NOTE — Care Management (Signed)
Patient is for discharge hoe today.  he has no PCP and wife would like for patient to get established with [facility] in the office with Dr Derrel Nip.  Appointment made for 2.18. Patient is going to require home oxygen and home nebulizer.  he has no agency preference.  Gave medicare compare information. [facility] to provide oxygen.  Patient is declining need for home health nurse.  He would benefit but patient feels "we (patient and wife) will be able to manage."

## 2018-09-14 NOTE — Discharge Instructions (Signed)
[name] to call [facility] to make appointment for primary care physician.

## 2018-09-14 NOTE — Discharge Summary (Signed)
[place] [facility] [place] NAME: Ian Mills    MR#:  [id number]  DATE OF BIRTH:  [birth year]  DATE OF ADMISSION:  09/08/2018 ADMITTING PHYSICIAN: Demetrios Loll, MD  DATE OF DISCHARGE: 09/14/2018  PRIMARY CARE PHYSICIAN: Patient, No Pcp Per    ADMISSION DIAGNOSIS:  COPD exacerbation ([place]) [J44.1]  DISCHARGE DIAGNOSIS:  acute on chronic COPD exacerbation now on oxygen multifocal pneumonia  SECONDARY DIAGNOSIS:   Past Medical History:  Diagnosis Date  . Coronary artery disease    a. 3 stents to the LAD, 1 to the PDA, and 1 to the OM3; b. cath 06/2013: chronically occluded LAD, patent stent LAD, D1 20%, pLCx 30%, mLCx 20%, pRCA 70% (FFR 0.74) s/p PCI/DES, mRCA 70% s/p PCI/DES, RPDA 50%, EF 20% w/ AK of anterolateral & apical walls, moderately elevated LVEDP  . HLD (hyperlipidemia)   . HTN (hypertension)   . ICD  single,BSX    a. DOI 11/2011; b. S/N# [id number]  . Ischemic cardiomyopathy    a. echo: 05/2014: EF 25-30%, AK of mid-dist ant, apical and dist inf walls, mod LVH, DD, mod dilated LA, mildly dilated RA, mild MR, mild Ao scl w/o stenosis    . Syncope and collapse   . Ventricular tachycardia ([place]) 11/2011   a. s/p AICD implant     HOSPITAL COURSE:   *Multifocal pneumonia -CT chest with multifocal pneumonia or possibly a trapping secondary to small airway disease -Patient is  on IV cefepime , IV steroids--changed to oral abxs and steroid taper -Bronchodilator treatments -- patient will follow-up with pulmonology as outpatient. -nebulizer, flutter valve, incentive spirometer -patient does qualify for home oxygen. Will also order home nebulizer  *acute on chronic COPD exacerbation. -Clinically improving slowly -Continue DuoNeb every 6 hours, IV Solu-Medrol tapering Dulera, Robitussin as needed. -Wean oxygen as tolerated -change to oral steroids -patient and wife were shown CT scan images  *Acute renal failure due to  dehydration. -Slowly improving, follow-up BMP  -Creatinine 1.70-1.67-1.45  *CAD.  -Continue aspirin, Plavix and statin.  *History of chronic systolic CHF.  -Ejection fraction 25%. Stable. -Patient has AICD -Resume Lasix, spironolactone and hold Entresto for now, continue Coreg and amiodarone, - resumethesemedications.  *Anxiety Xanax as needed  *Tobacco abuse. Smoking cessation was counseled for 3 to 4 minutes.  Discussed with patient and wife will discharged to home with outpatient follow-up with cardiology, pulmonology. Wife will call [facility] to set up appointment for PCP  CONSULTS OBTAINED:    DRUG ALLERGIES:   Allergies  Allergen Reactions  . Nitroglycerin Nausea Only and Other (See Comments)    Nauseas Nauseas Per patient "causes server  headache"    DISCHARGE MEDICATIONS:   Allergies as of 09/14/2018      Reactions   Nitroglycerin Nausea Only, Other (See Comments)   Nauseas Nauseas Per patient "causes server  headache"      Medication List    TAKE these medications   ALPRAZolam 0.5 MG tablet Commonly known as:  XANAX Take 1 tablet (0.5 mg total) by mouth 3 (three) times daily as needed for anxiety. What changed:  when to take this   amiodarone 200 MG tablet Commonly known as:  PACERONE Take 1.5 tablets (300 mg total) by mouth daily.   aspirin 81 MG EC tablet Take 1 tablet (81 mg total) by mouth daily.   atorvastatin 40 MG tablet Commonly known as:  LIPITOR Take 1 tablet (40 mg total) by mouth daily.  carvedilol 3.125 MG tablet Commonly known as:  COREG Take 1 tablet (3.125 mg total) by mouth 2 (two) times daily.   cefdinir 300 MG capsule Commonly known as:  OMNICEF Take 1 capsule (300 mg total) by mouth every 12 (twelve) hours.   clopidogrel 75 MG tablet Commonly known as:  PLAVIX Take 1 tablet (75 mg total) by mouth daily.   ezetimibe 10 MG tablet Commonly known as:  ZETIA Take 1 tablet (10 mg total) by  mouth daily.   furosemide 40 MG tablet Commonly known as:  LASIX Take 1 tablet (40 mg total) by mouth 2 (two) times daily.   ipratropium-albuterol 0.5-2.5 (3) MG/3ML Soln Commonly known as:  DUONEB Take 3 mLs by nebulization every 6 (six) hours as needed.   mometasone-formoterol 100-5 MCG/ACT Aero Commonly known as:  DULERA Inhale 2 puffs into the lungs 2 (two) times daily.   potassium chloride 10 MEQ tablet Commonly known as:  KLOR-CON 10 Take 1 tablet (10 mEq total) by mouth daily.   predniSONE 10 MG tablet Commonly known as:  DELTASONE Take 50 mg daily taper by 10 mg daily then stop   sacubitril-valsartan 97-103 MG Commonly known as:  ENTRESTO Take 1 tablet by mouth 2 (two) times daily.   spironolactone 25 MG tablet Commonly known as:  ALDACTONE Take 1 tablet (25 mg total) by mouth daily.   tadalafil 10 MG tablet Commonly known as:  CIALIS Take 1 tablet (10 mg total) by mouth daily as needed for erectile dysfunction (take 30 min prior to when needed.).            Durable Medical Equipment  (From admission, onward)         Start     Ordered   09/14/18 1002  For home use only DME Nebulizer machine  Once    Question:  Patient needs a nebulizer to treat with the following condition  Answer:  COPD with acute exacerbation ([place])   09/14/18 1001   09/14/18 1001  DME Oxygen  Once    Question Answer Comment  Mode or (Route) Nasal cannula   Liters per Minute 2   Frequency Continuous (stationary and portable oxygen unit needed)   Oxygen conserving device Yes   Oxygen delivery system Gas      09/14/18 1001          If you experience worsening of your admission symptoms, develop shortness of breath, life threatening emergency, suicidal or homicidal thoughts you must seek medical attention immediately by calling 911 or calling your MD immediately  if symptoms less severe.  You Must read complete instructions/literature along with all the possible adverse  reactions/side effects for all the Medicines you take and that have been prescribed to you. Take any new Medicines after you have completely understood and accept all the possible adverse reactions/side effects.   Please note  You were cared for by a hospitalist during your hospital stay. If you have any questions about your discharge medications or the care you received while you were in the hospital after you are discharged, you can call the unit and asked to speak with the hospitalist on call if the hospitalist that took care of you is not available. Once you are discharged, your primary care physician will handle any further medical issues. Please note that NO REFILLS for any discharge medications will be authorized once you are discharged, as it is imperative that you return to your primary care physician (or establish a relationship with  a primary care physician if you do not have one) for your aftercare needs so that they can reassess your need for medications and monitor your lab values. Today   SUBJECTIVE   No new complaints other than exertional shortness of breath  VITAL SIGNS:  Blood pressure (!) 154/81, pulse 61, temperature 97.8 F (36.6 C), temperature source Oral, resp. rate 18, height 5\' 8"  (1.727 m), weight 86.2 kg, SpO2 94 %.  I/O:  No intake or output data in the 24 hours ending 09/14/18 1003  PHYSICAL EXAMINATION:  GENERAL:  63 y.o.-year-old patient lying in the bed with no acute distress. Appears chronically ill EYES: Pupils equal, round, reactive to light and accommodation. No scleral icterus. Extraocular muscles intact.  HEENT: Head atraumatic, normocephalic. Oropharynx and nasopharynx clear.  NECK:  Supple, no jugular venous distention. No thyroid enlargement, no tenderness.  LUNGS: distant breath sounds bilaterally, no wheezing, rales,rhonchi or crepitation. No use of accessory muscles of respiration.  CARDIOVASCULAR: S1, S2 normal. No murmurs, rubs, or gallops.   ABDOMEN: Soft, non-tender, non-distended. Bowel sounds present. No organomegaly or mass.  EXTREMITIES: No pedal edema, cyanosis, or clubbing.  NEUROLOGIC: Cranial nerves II through XII are intact. Muscle strength 5/5 in all extremities. Sensation intact. Gait not checked.  PSYCHIATRIC: The patient is alert and oriented x 3.  SKIN: No obvious rash, lesion, or ulcer.   DATA REVIEW:   CBC  Recent Labs  Lab 09/09/18 0458  WBC 18.5*  HGB 10.5*  HCT 35.6*  PLT 213    Chemistries  Recent Labs  Lab 09/08/18 1220  09/12/18 0554  NA 137   < > 134*  K 3.8   < > 4.0  CL 103   < > 101  CO2 27   < > 26  GLUCOSE 102*   < > 113*  BUN 36*   < > 41*  CREATININE 1.67*  1.70*   < > 1.42*  CALCIUM 9.2   < > 8.6*  MG 2.2  --   --    < > = values in this interval not displayed.    Microbiology Results   No results found for this or any previous visit (from the past 240 hour(s)).  RADIOLOGY:  No results found.   CODE STATUS:     Code Status Orders  (From admission, onward)         Start     Ordered   09/09/18 1813  Full code  Continuous     09/09/18 1813        Code Status History    Date Active Date Inactive Code Status Order ID Comments User Context   09/08/2018 1906 09/09/2018 1813 Partial Code [id number]  Demetrios Loll, MD ED    Advance Directive Documentation     Most Recent Value  Type of Advance Directive  Healthcare Power of Attorney, Living will  Pre-existing out of facility DNR order (yellow form or pink MOST form)  -  "MOST" Form in Place?  -      TOTAL TIME TAKING CARE OF THIS PATIENT: *40* minutes.    Fritzi Mandes M.D on 09/14/2018 at 10:03 AM  Between 7am to 6pm - Pager - [phone] After 6pm go to [email or web address] - password [email or web address] [facility]  Office  [phone]  CC: Primary care physician; Patient, No Pcp Per

## 2018-09-15 ENCOUNTER — Other Ambulatory Visit: Payer: Self-pay | Admitting: Cardiovascular Disease

## 2018-09-15 ENCOUNTER — Encounter: Payer: Self-pay | Admitting: Family Medicine

## 2018-09-15 ENCOUNTER — Ambulatory Visit (INDEPENDENT_AMBULATORY_CARE_PROVIDER_SITE_OTHER): Payer: Medicare Other

## 2018-09-15 ENCOUNTER — Ambulatory Visit (INDEPENDENT_AMBULATORY_CARE_PROVIDER_SITE_OTHER): Payer: Medicare Other | Admitting: Family Medicine

## 2018-09-15 VITALS — BP 100/58 | HR 59 | Temp 97.9°F | Resp 20 | Ht 67.5 in | Wt 206.8 lb

## 2018-09-15 DIAGNOSIS — I5023 Acute on chronic systolic (congestive) heart failure: Secondary | ICD-10-CM

## 2018-09-15 DIAGNOSIS — J189 Pneumonia, unspecified organism: Secondary | ICD-10-CM | POA: Diagnosis not present

## 2018-09-15 DIAGNOSIS — I472 Ventricular tachycardia, unspecified: Secondary | ICD-10-CM

## 2018-09-15 DIAGNOSIS — F17200 Nicotine dependence, unspecified, uncomplicated: Secondary | ICD-10-CM | POA: Diagnosis not present

## 2018-09-15 DIAGNOSIS — J441 Chronic obstructive pulmonary disease with (acute) exacerbation: Secondary | ICD-10-CM | POA: Diagnosis not present

## 2018-09-15 DIAGNOSIS — I25118 Atherosclerotic heart disease of native coronary artery with other forms of angina pectoris: Secondary | ICD-10-CM

## 2018-09-15 NOTE — Progress Notes (Signed)
Subjective:    Patient ID: Ian Mills, male    DOB: [birth year], 63 y.o.   MRN: [id number]  HPI   Patient presents to clinic to establish with primary care and for post hospital follow-up.  Patient has a history of  Cigarette smoking, COPD, heart failure, coronary artery disease, hyperlipidemia.  He has not had a PCP in the past 2 years.  He has been following mainly which is cardiology.  Hospital admission note and discharge summary reviewed.  Lab work and imaging done in hospital reviewed.  Patient has upcoming appointment with pulmonology scheduled for 09/17/2018 and also appointment with cardiology on 10/12/2018.   DATE OF ADMISSION:  09/08/2018    ADMITTING PHYSICIAN: Demetrios Loll, MD  DATE OF DISCHARGE: 09/14/2018  ADMISSION DIAGNOSIS:  COPD exacerbation ([facility]) [J44.1]  DISCHARGE DIAGNOSIS:  acute on chronic COPD exacerbation now on oxygen multifocal pneumonia  SECONDARY DIAGNOSIS:       Past Medical History:  Diagnosis Date  . Coronary artery disease    a. 3 stents to the LAD, 1 to the PDA, and 1 to the OM3; b. cath 06/2013: chronically occluded LAD, patent stent LAD, D1 20%, pLCx 30%, mLCx 20%, pRCA 70% (FFR 0.74) s/p PCI/DES, mRCA 70% s/p PCI/DES, RPDA 50%, EF 20% w/ AK of anterolateral & apical walls, moderately elevated LVEDP  . HLD (hyperlipidemia)   . HTN (hypertension)   . ICD  single,BSX    a. DOI 11/2011; b. S/N# [id number]  . Ischemic cardiomyopathy    a. echo: 05/2014: EF 25-30%, AK of mid-dist ant, apical and dist inf walls, mod LVH, DD, mod dilated LA, mildly dilated RA, mild MR, mild Ao scl w/o stenosis    . Syncope and collapse   . Ventricular tachycardia ([place]) 11/2011   a. s/p AICD implant     HOSPITAL COURSE:   *Multifocal pneumonia -CT chest with multifocal pneumonia or possibly a trapping secondary to small airway disease -Patient is on IV cefepime , IV steroids--changed to oral abxs and steroid taper -Bronchodilator treatments --  patient will follow-up with pulmonology as outpatient. -nebulizer, flutter valve, incentive spirometer -patient does qualify for home oxygen. Will also order home nebulizer  *acute on chronic COPD exacerbation. -Clinically improving slowly -Continue DuoNeb every 6 hours, IV Solu-Medrol tapering Dulera, Robitussin as needed. -Wean oxygen as tolerated -change to oral steroids -patient and wife were shown CT scan images  *Acute renal failure due to dehydration. -Slowly improving, follow-up BMP  -Creatinine 1.70-1.67-1.45  *CAD.  -Continue aspirin, Plavix and statin.  *History of chronic systolic CHF.  -Ejection fraction 25%. Stable. -Patient has AICD -Resume Lasix, spironolactone and hold Entresto for now, continue Coreg and amiodarone, - resumethesemedications.  *Anxiety Xanax as needed  *Tobacco abuse. Smoking cessation was counseled for 3 to 4 minutes.  Discussed with patient and wife will discharged to home with outpatient follow-up with cardiology, pulmonology. Wife will call [facility] to set up appointment for PCP   Patient Active Problem List   Diagnosis Date Noted  . COPD exacerbation ([place]) 09/08/2018  . Acute on chronic systolic CHF (congestive heart failure) ([place]) 06/27/2016  . Dizziness 03/30/2015  . Smoker 09/29/2014  . Syncope 06/01/2014  . Insomnia 06/01/2014  . Single implantable cardioverter-defibrillator-BSx   . Ischemic cardiomyopathy   . Ventricular tachycardia ([place]) 01/23/2012  . SYNCOPE AND COLLAPSE 09/26/2010  . Hyperlipidemia 02/23/2010  . Hypotension 02/23/2010  . CAD (coronary artery disease) 02/23/2010   Social History   Tobacco Use  .  Smoking status: Current Every Day Smoker    Packs/day: 0.50    Types: Cigarettes  . Smokeless tobacco: Never Used  Substance Use Topics  . Alcohol use: Not Currently    Frequency: Never   Past Surgical History:  Procedure Laterality Date  . appendectomy    . CARDIAC  CATHETERIZATION  Nov 30 2011   [facility]  . CARDIAC DEFIBRILLATOR PLACEMENT  May 2013  . CORONARY ANGIOPLASTY  2014   x 2 stents  . CORONARY STENT PLACEMENT     x7  . PROSTATE SURGERY    . TUMOR REMOVAL     chest   Family History  Problem Relation Age of Onset  . Cancer Father        larynx  . Cancer - Lung Mother   . Hypertension Mother   . Heart attack Mother   . Dementia Maternal Grandmother   . Heart attack Paternal Grandfather    Review of Systems   Constitutional: Negative for chills, and fever. +fatigue HENT: Negative for congestion, ear pain, sinus pain and sore throat.   Eyes: Negative.   Respiratory: +cough, shortness of breath and wheezing - improving.   Cardiovascular: Negative for chest pain, palpitations and leg swelling.  Gastrointestinal: Negative for abdominal pain, diarrhea, nausea and vomiting.  Genitourinary: Negative for dysuria, frequency and urgency.  Musculoskeletal: Negative for arthralgias and myalgias.  Skin: Negative for color change, pallor and rash.  Neurological: Negative for syncope, light-headedness and headaches.  Psychiatric/Behavioral: The patient is not nervous/anxious.       Objective:   Physical Exam Vitals signs and nursing note reviewed.  Constitutional:      General: He is not in acute distress.    Appearance: He is not diaphoretic.  HENT:     Head: Normocephalic and atraumatic.     Right Ear: Tympanic membrane, ear canal and external ear normal.     Left Ear: Tympanic membrane, ear canal and external ear normal.     Nose: Nose normal.     Mouth/Throat:     Mouth: Mucous membranes are moist.  Eyes:     General: No scleral icterus.    Extraocular Movements: Extraocular movements intact.     Pupils: Pupils are equal, round, and reactive to light.  Neck:     Musculoskeletal: Neck supple. No neck rigidity.  Cardiovascular:     Rate and Rhythm: Normal rate and regular rhythm.     Heart sounds: No murmur.  Pulmonary:     Effort:  Pulmonary effort is normal. No respiratory distress.     Breath sounds: Wheezing and rhonchi present.     Comments: On 2L O2 via [place] Abdominal:     General: Bowel sounds are normal. There is no distension.     Palpations: Abdomen is soft.     Tenderness: There is no abdominal tenderness.  Musculoskeletal:     Right lower leg: No edema.     Left lower leg: No edema.  Lymphadenopathy:     Cervical: No cervical adenopathy.  Skin:    General: Skin is warm and dry.     Coloration: Skin is not jaundiced or pale.  Neurological:     Mental Status: He is alert and oriented to person, place, and time.    Vitals:   09/15/18 1442  BP: (!) 100/58  Pulse: (!) 59  Resp: 20  Temp: 97.9 F (36.6 C)  SpO2: 94%       Assessment & Plan:  A total of 45  minutes were spent face-to-face with the patient during this encounter and over half of that time was spent on counseling and coordination of care. The patient was counseled on labs and results from in hospital, importance of keeping appt with cardiology and pulmonology.   COPD exacerbation, multifocal pneumonia- patient will finish oral steroids and antibiotics as prescribed upon hospital discharge.  He will continue oxygen at 2 L/min for now and wean as tolerated and as advised by pulmonologist at appointment on the 20th.  Advised that his breathing should continue to improve and not worsen.  If breathing does worsen, advised to to call office or call 911 right away for evaluation.  Acute on chronic systolic heart failure/coronary artery disease-patient is advised to resume medications as as prescribed upon hospital discharge summary and keep planned follow-up with cardiology in March.  Smoker - Advised to quit smoking.   Patient will follow-up here in 2 - 3 months for recheck on chronic medical conditions.  Advised he will return to clinic sooner if any issues arise.  Keep pulmonology and cardiology appointments as planned.

## 2018-09-15 NOTE — Progress Notes (Signed)
[facility] Consultation      Assessment and Plan:  COPD  exacerbation. Status post pneumonia with continued chronic hypoxic respiratory failure, on oxygen at 2 L. Nicotine abuse, recently quit.  -Patient is currently recovering from a recent pneumonia.  He is using Dulera excessively.  Patient was counseled to use Dulera 2 puffs twice daily, I will also be starting him on Spiriva once daily.  Once he starts to feel better in a few weeks I encouraged him to start increasing his physical activity. -Patient has bilateral wheezing suggestive of ongoing exacerbation.  Continue prednisone.  Will give nebulizer treatment today. -We also discussed that the most important thing he can do for his breathing is not to restart smoking. - Patient is on Medicare but does not have prescription coverage.  Will refer him to medication management for assistance in getting his inhalers.   Date: 09/17/2018  MRN# [id number] Ian Mills 02-05-1956  Referring Physician: Dr. Derrel Nip for copd.   Ian Mills is a 63 y.o. old male seen in consultation for chief complaint of:    Chief Complaint  Patient presents with  . Hospitalization Follow-up    pt states he is still recovering. He still has sob with and without exertion.    HPI:   The patient is a 63 year old male, is recently admitted to the hospital from 09/08/2018- 2/17 for multifocal pneumonia and COPD exacerbation. Before he got sick he was sob at baseline. He does not work, he is on disability for heart problems. He has worked in [employer]. He last smoked about 2 weeks ago, he was smoking about a ppd. He has not been told that he had COPD in the past.  He could [facility] a [employer], he would get winded with showering and changing.  After the hospitalization he is on 2L O2. 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 He is currently on prednisone which will finish Saturday.  He has no pets, he denies  reflux, does have sinus drainage.  He is taking dulera 2 puffs 3 or 4 times per day, he is using a neb about twice per day.  He has mild cough, no hemoptysis.  He has an ICD, ejection fraction 30-35%, he has a history of ventricular tachycardia, syncope and ischemic cardiomyopathy.  CT chest 09/11/2017>> imaging personally reviewed, there is bilateral nasal mosaic groundglass changes, suggestive of acute infectious process.  Likely pneumonia.   PMHX:   Past Medical History:  Diagnosis Date  . Coronary artery disease    a. 3 stents to the LAD, 1 to the PDA, and 1 to the OM3; b. cath 06/2013: chronically occluded LAD, patent stent LAD, D1 20%, pLCx 30%, mLCx 20%, pRCA 70% (FFR 0.74) s/p PCI/DES, mRCA 70% s/p PCI/DES, RPDA 50%, EF 20% w/ AK of anterolateral & apical walls, moderately elevated LVEDP  . HLD (hyperlipidemia)   . HTN (hypertension)   . ICD  single,BSX    a. DOI 11/2011; b. S/N# [id number]  . Ischemic cardiomyopathy    a. echo: 05/2014: EF 25-30%, AK of mid-dist ant, apical and dist inf walls, mod LVH, DD, mod dilated LA, mildly dilated RA, mild MR, mild Ao scl w/o stenosis    . Syncope and collapse   . Ventricular tachycardia ([place]) 11/2011   a. s/p AICD implant    Surgical Hx:  Past Surgical History:  Procedure Laterality Date  . appendectomy    . CARDIAC CATHETERIZATION  Nov 30 2011   [facility]  . CARDIAC DEFIBRILLATOR PLACEMENT  May 2013  . CORONARY ANGIOPLASTY  2014   x 2 stents  . CORONARY STENT PLACEMENT     x7  . PROSTATE SURGERY    . TUMOR REMOVAL     chest   Family Hx:  Family History  Problem Relation Age of Onset  . Cancer Father        larynx  . Cancer - Lung Mother   . Hypertension Mother   . Heart attack Mother   . Dementia Maternal Grandmother   . Heart attack Paternal Grandfather    Social Hx:   Social History   Tobacco Use  . Smoking status: Former Smoker    Packs/day: 0.50    Types: Cigarettes    Last attempt to quit: 09/03/2018    Years since  quitting: 0.0  . Smokeless tobacco: Never Used  Substance Use Topics  . Alcohol use: Not Currently    Frequency: Never  . Drug use: No   Medication:    Current Outpatient Medications:  .  ALPRAZolam (XANAX) 0.5 MG tablet, Take 1 tablet (0.5 mg total) by mouth 3 (three) times daily as needed for anxiety., Disp: 25 tablet, Rfl: 0 .  amiodarone (PACERONE) 200 MG tablet, Take 1.5 tablets (300 mg total) by mouth daily., Disp: 135 tablet, Rfl: 3 .  aspirin 81 MG EC tablet, Take 1 tablet (81 mg total) by mouth daily., Disp: 30 tablet, Rfl: 6 .  atorvastatin (LIPITOR) 40 MG tablet, Take 1 tablet (40 mg total) by mouth daily., Disp: 30 tablet, Rfl: 2 .  carvedilol (COREG) 3.125 MG tablet, Take 1 tablet (3.125 mg total) by mouth 2 (two) times daily., Disp: 180 tablet, Rfl: 3 .  clopidogrel (PLAVIX) 75 MG tablet, Take 1 tablet (75 mg total) by mouth daily., Disp: 30 tablet, Rfl: 6 .  ezetimibe (ZETIA) 10 MG tablet, TAKE 1 TABLET BY MOUTH ONCE DAILY, Disp: 90 tablet, Rfl: 0 .  furosemide (LASIX) 40 MG tablet, Take 1 tablet (40 mg total) by mouth 2 (two) times daily., Disp: 180 tablet, Rfl: 3 .  ipratropium-albuterol (DUONEB) 0.5-2.5 (3) MG/3ML SOLN, Take 3 mLs by nebulization every 6 (six) hours as needed., Disp: 360 mL, Rfl: 0 .  mometasone-formoterol (DULERA) 100-5 MCG/ACT AERO, Inhale 2 puffs into the lungs 2 (two) times daily., Disp: 1 Inhaler, Rfl: 1 .  potassium chloride (KLOR-CON 10) 10 MEQ tablet, Take 1 tablet (10 mEq total) by mouth daily., Disp: 90 tablet, Rfl: 3 .  predniSONE (DELTASONE) 10 MG tablet, Take 50 mg daily taper by 10 mg daily then stop, Disp: 15 tablet, Rfl: 0 .  sacubitril-valsartan (ENTRESTO) 97-103 MG, Take 1 tablet by mouth 2 (two) times daily., Disp: 60 tablet, Rfl: 3 .  spironolactone (ALDACTONE) 25 MG tablet, Take 1 tablet (25 mg total) by mouth daily., Disp: 90 tablet, Rfl: 3 .  tadalafil (CIALIS) 10 MG tablet, Take 1 tablet (10 mg total) by mouth daily as needed for  erectile dysfunction (take 30 min prior to when needed.)., Disp: 10 tablet, Rfl: 3   Allergies:  Nitroglycerin  Review of Systems: Gen:  Denies  fever, sweats, chills HEENT: Denies blurred vision, double vision. bleeds, sore throat Cvc:  No dizziness, chest pain. Resp:   Denies cough or sputum production, shortness of breath Gi: Denies swallowing difficulty, stomach pain. Gu:  Denies bladder incontinence, burning urine Ext:   No Joint pain, stiffness. Skin: No skin rash,  hives  Endoc:  No polyuria, polydipsia. Psych: No depression, insomnia. Other:  All other systems were reviewed with the patient and were negative other that what is mentioned in the HPI.   Physical Examination:   VS: BP 120/64 (BP Location: Left Arm, Cuff Size: Normal)   Pulse (!) 55   Resp 16   Ht 5' 7.5" (1.715 m)   Wt 206 lb (93.4 kg)   SpO2 100%   BMI 31.79 kg/m   General Appearance: No distress  Neuro:without focal findings,  speech normal,  HEENT: PERRLA, EOM intact.   Pulmonary: Bilateral diffuse expiratory wheezing. CardiovascularNormal S1,S2.  No m/r/g.   Abdomen: Benign, Soft, non-tender. Renal:  No costovertebral tenderness  GU:  No performed at this time. Endoc: No evident thyromegaly, no signs of acromegaly. Skin:   warm, no rashes, no ecchymosis  Extremities: normal, no cyanosis, clubbing.  Other findings:    LABORATORY PANEL:   CBC No results for input(s): WBC, HGB, HCT, PLT in the last 168 hours. ------------------------------------------------------------------------------------------------------------------  Chemistries  Recent Labs  Lab 09/12/18 0554  NA 134*  K 4.0  CL 101  CO2 26  GLUCOSE 113*  BUN 41*  CREATININE 1.42*  CALCIUM 8.6*   ------------------------------------------------------------------------------------------------------------------  Cardiac Enzymes Recent Labs  Lab 09/11/18 1038  TROPONINI <0.03    ------------------------------------------------------------  RADIOLOGY:  No results found.     Thank  you for the consultation and for allowing [facility] to assist in the care of your patient. Our recommendations are noted above.  Please contact us if we can be of further service.   Marda Stalker, M.D., F.C.C.P.  Board Certified in Internal Medicine, Pulmonary Medicine, [place], and Sleep Medicine.  [facility] Office Number: [phone]   09/17/2018

## 2018-09-16 ENCOUNTER — Other Ambulatory Visit: Payer: Self-pay | Admitting: *Deleted

## 2018-09-16 ENCOUNTER — Encounter: Payer: Self-pay | Admitting: Family Medicine

## 2018-09-16 ENCOUNTER — Telehealth: Payer: Self-pay | Admitting: Cardiovascular Disease

## 2018-09-16 LAB — CUP PACEART REMOTE DEVICE CHECK
Battery Remaining Longevity: 78 mo
Battery Remaining Percentage: 83 %
Brady Statistic RV Percent Paced: 0 %
Date Time Interrogation Session: 20200218072000
HighPow Impedance: 98 Ohm
Implantable Lead Implant Date: 20130503
Implantable Lead Model: 292
Implantable Lead Serial Number: 112568
Lead Channel Impedance Value: 644 Ohm
Lead Channel Pacing Threshold Amplitude: 0.8 V
Lead Channel Setting Pacing Amplitude: 2.4 V
Lead Channel Setting Pacing Pulse Width: 0.5 ms
Lead Channel Setting Sensing Sensitivity: 0.6 mV
MDC IDC LEAD LOCATION: 753860
MDC IDC MSMT LEADCHNL RV PACING THRESHOLD PULSEWIDTH: 0.5 ms
MDC IDC PG IMPLANT DT: 20130503
Pulse Gen Serial Number: 105232

## 2018-09-16 MED ORDER — AMIODARONE HCL 200 MG PO TABS: 300.0000 mg | ORAL_TABLET | Freq: Every day | ORAL | 3 refills | Status: DC

## 2018-09-16 MED ORDER — POTASSIUM CHLORIDE ER 10 MEQ PO TBCR: 10.0000 meq | EXTENDED_RELEASE_TABLET | Freq: Every day | ORAL | 3 refills | Status: DC

## 2018-09-16 NOTE — Telephone Encounter (Signed)
Requested Prescriptions   Signed Prescriptions Disp Refills  . amiodarone (PACERONE) 200 MG tablet 135 tablet 3    Sig: Take 1.5 tablets (300 mg total) by mouth daily.    Authorizing Provider: Minna Merritts    Ordering User: Eugenio Hoes, MARINA C  . potassium chloride (KLOR-CON 10) 10 MEQ tablet 90 tablet 3    Sig: Take 1 tablet (10 mEq total) by mouth daily.    Authorizing Provider: Minna Merritts    Ordering User: Britt Bottom

## 2018-09-16 NOTE — Telephone Encounter (Signed)
°*  STAT* If patient is at the pharmacy, call can be transferred to refill team.   1. Which medications need to be refilled? (please list name of each medication and dose if known)     Amiodarone 200 mg po q d     Potassium 10 mEq po q d   2. Which pharmacy/location (including street and city if local pharmacy) is medication to be sent to?     [facility]  3. Do they need a 30 day or 90 day supply? [place]

## 2018-09-16 NOTE — Telephone Encounter (Signed)
Requested Prescriptions   Signed Prescriptions Disp Refills  . amiodarone (PACERONE) 200 MG tablet 135 tablet 3    Sig: Take 1.5 tablets (300 mg total) by mouth daily.    Authorizing Provider: Minna Merritts    Ordering User: Eugenio Hoes, Jalin Erpelding C  . potassium chloride (KLOR-CON 10) 10 MEQ tablet 90 tablet 3    Sig: Take 1 tablet (10 mEq total) by mouth daily.    Authorizing Provider: Minna Merritts    Ordering User: Britt Bottom

## 2018-09-17 ENCOUNTER — Encounter: Payer: Self-pay | Admitting: Internal Medicine

## 2018-09-17 ENCOUNTER — Ambulatory Visit (INDEPENDENT_AMBULATORY_CARE_PROVIDER_SITE_OTHER): Payer: Medicare Other | Admitting: Internal Medicine

## 2018-09-17 ENCOUNTER — Other Ambulatory Visit: Payer: Self-pay | Admitting: *Deleted

## 2018-09-17 VITALS — BP 120/64 | HR 55 | Resp 16 | Ht 67.5 in | Wt 206.0 lb

## 2018-09-17 DIAGNOSIS — J441 Chronic obstructive pulmonary disease with (acute) exacerbation: Secondary | ICD-10-CM

## 2018-09-17 DIAGNOSIS — I255 Ischemic cardiomyopathy: Secondary | ICD-10-CM

## 2018-09-17 MED ORDER — TIOTROPIUM BROMIDE MONOHYDRATE 18 MCG IN CAPS: 18.0000 ug | ORAL_CAPSULE | Freq: Every day | RESPIRATORY_TRACT | 12 refills | Status: DC

## 2018-09-17 MED ORDER — MOMETASONE FURO-FORMOTEROL FUM 200-5 MCG/ACT IN AERO: 2.0000 | INHALATION_SPRAY | Freq: Two times a day (BID) | RESPIRATORY_TRACT | 0 refills | Status: DC

## 2018-09-17 MED ORDER — IPRATROPIUM-ALBUTEROL 0.5-2.5 (3) MG/3ML IN SOLN
3.0000 mL | Freq: Once | RESPIRATORY_TRACT | Status: AC
  Administered 2018-09-17: 3 mL via RESPIRATORY_TRACT

## 2018-09-17 NOTE — Patient Outreach (Addendum)
[facility]) Care Management  09/17/2018  MALAKAI SCHOENHERR 1956-06-22 [id number]   Subjective: Telephone call to patient's home  / mobile number, no answer, left HIPAA compliant voicemail message, and requested call back.     Objective: Per KPN (Knowledge Performance Now, point of care tool) and chart review, patient hospitalized  09/08/2018 - 09/14/2018 for COPD exacerbation, multifocal pneumonia.   Patient also has a history of hypertension, CAD, hyperlipidemia,Ischemic cardiomyopathy, and Ventricular tachycardia  (status post AICD).       Assessment: Received Medicare EMMI General Discharge Red Flag Alert times 2, on 09/17/2018.   Red Flag Alert trigger, Day #1, patient answered yes to the following questions: Questions about discharge papers?   Unfilled prescriptions?   [facility] EMMI follow up pending patient contact.      Plan: RNCM will call patient for 2nd telephone outreach attempt within 4 business days, [facility] EMMI follow up, and proceed with case closure, within 10 business days if no return call.      Arielle Eber H. Annia Friendly, BSN, [place] Management [facility] Telephonic CM Phone: [phone] Fax: [phone]

## 2018-09-17 NOTE — Addendum Note (Signed)
Addended by: Stephanie Coup on: 09/17/2018 02:58 PM   Modules accepted: Orders

## 2018-09-17 NOTE — Patient Instructions (Addendum)
Will continue dulera 200 2 puffs twice daily, rinse mouth after use.  Will start spiriva once daily.  Nebulizer treatment today.  Continue to use albuterol and nebulizer as needed.  Will refer you to medication management for help with coverage of prescription medications.  --Quitting smoking is the most important thing that you can do for your health.  --Quitting smoking will have greater affect on your health than any medicine that we can give you.

## 2018-09-18 ENCOUNTER — Encounter: Payer: Self-pay | Admitting: *Deleted

## 2018-09-18 ENCOUNTER — Other Ambulatory Visit: Payer: Self-pay

## 2018-09-18 ENCOUNTER — Other Ambulatory Visit: Payer: Self-pay | Admitting: *Deleted

## 2018-09-18 ENCOUNTER — Telehealth: Payer: Self-pay | Admitting: *Deleted

## 2018-09-18 NOTE — Patient Outreach (Signed)
[facility]) Care Management  09/18/2018  Ian Mills [birth year] [id number]   Subjective: Telephone call from patient's home / mobile number, spoke with patient, and HIPAA verified.  Discussed [facility] Care Management Medicare EMMI General Discharge follow up, patient voiced understanding, and is in agreement to follow up.    Patient states he remembers getting EMMI automated calls, does not have any questions regarding discharge papers, has not filled prescription for Spiriva 18 mcg inhalation, and not sure if based on wife's research ($480 cost through GoodRx) he can afford the cost of the medication.  States he does not currently have a prescription drug plan and planning to work on obtaining in the near future.   States he is able to afford all other medications.   Patient in agreement to referral to [place] for medication review and medication assistance for Spiriva.   Patient states he has not taken Spiriva in the past, will contact MD regarding samples, and will follow up to with pulmonologist office recommendation to complete [facility] application as soon as possible.  Patient states he is able to manage self care and has assistance as needed.   Patient voices understanding of medical diagnosis and treatment plan.   Patient states his provider has ordered home oxygen from [facility], pulmonologist has also contacted [facility] on 09/17/2018  regarding delivery, patient waiting for call from [place] to verify delivery date / time, and is planning to follow up with [facility] if call not received by 12:00pm on 09/18/2018.   Patient states he had hospital follow up visit with primary MD on 09/15/2018, with pulmonologist on 09/17/2018, and all appointment have gone well.  States he will also have a follow up visit with cardiologist on 10/16/2018, last visit was on 09/08/2018,  provider sent him to the hospital for evaluation,  and provider is aware of his subsequent hospitalization.  Patient states he does not have any education material, EMMI follow up, care coordination, care management, disease monitoring, transportation, or community resource needs at this time.  States he is very appreciative of the follow up and is in agreement to receive [place] Management services.  RNCM provided contact name and number: [phone] or main office number [phone] for questions regarding referral.     Objective: Per KPN (Knowledge Performance Now, point of care tool) and chart review, patient hospitalized  09/08/2018 - 09/14/2018 for COPD exacerbation, multifocal pneumonia.   Patient also has a history of hypertension, CAD, hyperlipidemia,Ischemic cardiomyopathy, and Ventricular tachycardia  (status post AICD).       Assessment: Received Medicare EMMI General Discharge Red Flag Alert times 2, on 09/17/2018.   Red Flag Alert trigger, Day #1, patient answered yes to the following questions: Questions about discharge papers?   Unfilled prescriptions?   EMMI follow up completed and patient referred to [place] for medication review, Spiriva medication assistance.       Plan: RNCM will refer patient to  [place] for medication review and medication assistance for Spiriva.   RNCM will send update to [facility] Management to change patient's emergency chart in chart to wife Ian Mills ( [phone]).     Ameliana Brashear H. Annia Friendly, BSN, [place] Management [facility] Telephonic CM Phone: [phone] Fax: [phone]

## 2018-09-18 NOTE — Patient Outreach (Signed)
[facility]) Care Management  [place]   09/18/2018  Ian Mills 06/02/1956 [id number]  Reason for referral: Medication Assistance with Spiriva Handihaler  Referral source: [name] Current insurance:Medicare, no Part D   PMHx includes but not limited to:  coronary artery disease, systolic heart failure, COPD, hyperlipidemia  Outreach:  Successful telephone call with Mr. and Ian Mills.  HIPAA identifiers verified.   Subjective:  Ian Mills states that he was recently prescribed Spiriva and that it was going to cost him over $400/inhaler.  He states that he has a sample of Dulera, so he is unaware that this medication is expensive too.  He is receiving Entresto through a patient assistance program.  He states that he just turned 63 last month and has not signed up for a Medicare Part D plan yet.  He reports that he is "working on it."  He states that his wife is 61 yo and has been unemployed since September, but she is actively looking for employment.     Objective: Lab Results  Component Value Date   CREATININE 1.42 (H) 09/12/2018   CREATININE 1.45 (H) 09/11/2018   CREATININE 1.45 (H) 09/09/2018    No results found for: HGBA1C  Lipid Panel     Component Value Date/Time   CHOL 170 12/09/2016 1103   CHOL 111 01/20/2014 0929   TRIG 113 12/09/2016 1103   TRIG 88 01/20/2014 0929   HDL 53 12/09/2016 1103   HDL 39 (L) 01/20/2014 0929   CHOLHDL 3.2 12/09/2016 1103   VLDL 18 01/20/2014 0929   LDLCALC 94 12/09/2016 1103   LDLCALC 54 01/20/2014 0929    BP Readings from Last 3 Encounters:  09/17/18 120/64  09/15/18 (!) 100/58  09/14/18 (!) 154/81    Allergies  Allergen Reactions  . Nitroglycerin Nausea Only and Other (See Comments)    Nauseas Nauseas Per patient "causes server  headache"    Medications Reviewed Today    Reviewed by Dionne Milo, [facility] (Pharmacist) on 09/18/18 at 1242  Med List Status: <None>  Medication Order  Taking? Sig Documenting Provider Last Dose Status Informant  ALPRAZolam (XANAX) 0.5 MG tablet [id number] Yes Take 1 tablet (0.5 mg total) by mouth 3 (three) times daily as needed for anxiety. Fritzi Mandes, MD Taking Active   amiodarone (PACERONE) 200 MG tablet [id number] Yes Take 1.5 tablets (300 mg total) by mouth daily. Minna Merritts, MD Taking Active   aspirin 81 MG EC tablet [id number] Yes Take 1 tablet (81 mg total) by mouth daily. Minna Merritts, MD Taking Active Self  atorvastatin (LIPITOR) 40 MG tablet [id number] Yes Take 1 tablet (40 mg total) by mouth daily. Minna Merritts, MD Taking Active Self  carvedilol (COREG) 3.125 MG tablet [id number] Yes Take 1 tablet (3.125 mg total) by mouth 2 (two) times daily. Minna Merritts, MD Taking Active Self  clopidogrel (PLAVIX) 75 MG tablet [id number] Yes Take 1 tablet (75 mg total) by mouth daily. Minna Merritts, MD Taking Active Self  ezetimibe (ZETIA) 10 MG tablet [id number] Yes TAKE 1 TABLET BY MOUTH ONCE DAILY Gollan, Kathlene November, MD Taking Active   furosemide (LASIX) 40 MG tablet [id number] Yes Take 1 tablet (40 mg total) by mouth 2 (two) times daily. Minna Merritts, MD Taking Active Self  ipratropium-albuterol (DUONEB) 0.5-2.5 (3) MG/3ML SOLN [id number] Yes Take 3 mLs by nebulization every 6 (six) hours as needed. Fritzi Mandes, MD Taking Active   mometasone-formoterol [facility]) 100-5  MCG/ACT AERO [id number] Yes Inhale 2 puffs into the lungs 2 (two) times daily. Fritzi Mandes, MD Taking Active   mometasone-formoterol [facility]) 200-5 MCG/ACT Hollie Salk [id number] Yes Inhale 2 puffs into the lungs 2 (two) times daily. Laverle Hobby, MD Taking Active   potassium chloride (KLOR-CON 10) 10 MEQ tablet [id number] Yes Take 1 tablet (10 mEq total) by mouth daily. Minna Merritts, MD Taking Active   predniSONE (DELTASONE) 10 MG tablet [id number] Yes Take 50 mg daily taper by 10 mg daily then stop  Patient taking differently:  Take 50 mg daily taper by 10 mg  daily then stop ENDS 2/22   Fritzi Mandes, MD Taking Active   sacubitril-valsartan (ENTRESTO) 97-103 MG [id number] Yes Take 1 tablet by mouth 2 (two) times daily. Minna Merritts, MD Taking Active Self  spironolactone (ALDACTONE) 25 MG tablet [id number] Yes Take 1 tablet (25 mg total) by mouth daily. Minna Merritts, MD Taking Active Self  tadalafil (CIALIS) 10 MG tablet [id number] Yes Take 1 tablet (10 mg total) by mouth daily as needed for erectile dysfunction (take 30 min prior to when needed.). Minna Merritts, MD Taking Active Self  tiotropium [facility]) 18 MCG inhalation capsule [id number] No Place 1 capsule (18 mcg total) into inhaler and inhale daily for 30 days.  Patient not taking:  Reported on 09/18/2018   Laverle Hobby, MD Not Taking Active          Assessment:  Drugs sorted by system:  Neurologic/Psychologic: alprazolam  Cardiovascular: amiodarone, aspirin 81 mg, atorvastatin, carvedilol, clopidogrel ezetimibe, furosemide, potassium chloride, sacubitril/valsartan, spironolactone  Pulmonary/Allergy: ipratropium/albuterol, mometasone/formoterol, prednisone, tiotropium  Vitamins/Minerals/Supplements:  Miscellaneous: tadalafil  Medication Review Findings:  . Co-administration of potassium and potassium-sparing diuretic and sacubitril/valsartan have increased risk of hyperkalemia. . Per the [place] List, alprazolam may have decreased metabolism and increases sensitivity in older adults.  Risk of cognitive impairment, delirium, falls, fractures and motor vehicle accidents may occur. There is strong evidence to avoid use in the elderly. Marland Kitchen Spiriva- not taking at this time because he can't afford this medication and no samples are available.   Medication Assistance Findings:  Medication assistance needs identified.   Extra Help:   _0  Already receiving Full Extra Help  _1  Already receiving Partial Extra Help  _2  Eligible based on reported income and assets  _3  Not  Eligible based on reported income and assets  Patient Assistance Programs: 1) Dulera made by [employer] o Income requirement met: _4  Yes _5  No _6  Unknown o Out-of-pocket prescription expenditure met:    _7  Yes _8  No  _9  Unknown  <VFIEPPIRJJOACZYS>_0<\/YTKZSWFUXNATFTDD>_22  Not applicable - Patient has met application requirements to apply for this patient assistance program.          2)  Spiriva Handihaler made by [employer] o Income requirement met: _11  Yes _12  No  _13  Unknown o Out-of-pocket prescription expenditure met:   _14  Yes _15  No   _16  Unknown <GURKYHCWCBJSEGBT>_5<\/VVOHYWVPXTGGYIRS>_85  Not applicable - Patient has met application requirements to apply for this patient assistance program.    Additional medication assistance options reviewed with patient as warranted:  Coupon and Agricultural consultant - none available  Plan: I will route patient assistance letter to [place] technician who will coordinate patient assistance program application process for medications listed above.  [facility] pharmacy technician will assist with obtaining all required documents from both patient and provider(s) and submit application(s) once completed.

## 2018-09-18 NOTE — Telephone Encounter (Signed)
Medication management Ian Mills calling to see if we have samples of spiriva to give patient. It will be 6-8 weeks for approval process for medication management. Informed that we don't have samples of Spiriva handihaler or respimat. Nothing further needed.

## 2018-09-21 ENCOUNTER — Other Ambulatory Visit: Payer: Self-pay | Admitting: Pharmacy Technician

## 2018-09-21 NOTE — Patient Outreach (Signed)
[facility]) Care Management  09/21/2018  DAMMON MAKAREWICZ 1955-08-02 [id number]                                                  Medication Assistance Referral  Referral From: [facility] RPh Clearnce Sorrel  Medication/Company: Ruthe Mannan / [employer] Patient application portion:  Mailed Provider application portion: Interoffice Mailed to Dr. Beatrix Shipper  Medication/Company: Stann Ore / [employer] Patient application portion:  Mailed Provider application portion: Faxed  to Dr. Beatrix Shipper  Follow up:  Will follow up with patient in 5-7 business days to confirm application(s) have been received.  Maud Deed Chana Bode [place] Certified Pharmacy Technician [place] Management Direct [phone]

## 2018-09-23 NOTE — Progress Notes (Signed)
Remote ICD transmission.   

## 2018-09-25 ENCOUNTER — Telehealth: Payer: Self-pay | Admitting: Family Medicine

## 2018-09-25 ENCOUNTER — Telehealth: Payer: Self-pay | Admitting: Lab

## 2018-09-25 DIAGNOSIS — F419 Anxiety disorder, unspecified: Secondary | ICD-10-CM

## 2018-09-25 MED ORDER — ALPRAZOLAM 0.5 MG PO TABS: 0.5000 mg | ORAL_TABLET | Freq: Three times a day (TID) | ORAL | 1 refills | Status: DC | PRN

## 2018-09-25 NOTE — Telephone Encounter (Signed)
Copied from [place] [id number]. Topic: Quick Communication - Rx Refill/Question >> Sep 25, 2018  9:00 AM Antonieta Iba C wrote: Medication: ALPRAZolam Duanne Moron) 0.5 MG tablet   Has the patient contacted their pharmacy? No  (Agent: If no, request that the patient contact the pharmacy for the refill.) (Agent: If yes, when and what did the pharmacy advise?)  Preferred Pharmacy (with phone number or street name): [facility], [place] [phone] (Phone) [phone] (Fax)    Agent: Please be advised that RX refills may take up to 3 business days. We ask that you follow-up with your pharmacy.

## 2018-09-25 NOTE — Telephone Encounter (Signed)
Called Pt to tell him that his Rx was sent to pharmacy

## 2018-09-25 NOTE — Telephone Encounter (Signed)
Pt called [facility]  For refill Medication: ALPRAZolam (XANAX) 0.5 MG tablet   Preferred Pharmacy (with phone number or street name): [place], [place] [facility] [place]          [phone] (Phone) [phone] (Fax)

## 2018-09-25 NOTE — Telephone Encounter (Signed)
Xanax refill request.  

## 2018-09-28 ENCOUNTER — Encounter: Payer: Self-pay | Admitting: Cardiology

## 2018-10-02 ENCOUNTER — Other Ambulatory Visit: Payer: Self-pay | Admitting: Cardiovascular Disease

## 2018-10-06 ENCOUNTER — Other Ambulatory Visit: Payer: Self-pay | Admitting: Pharmacy Technician

## 2018-10-06 NOTE — Patient Outreach (Addendum)
[facility]) Care Management  10/06/2018  Ian Mills [birth year] [id number]    Unsuccessful call #1 placed to patient regarding patient assistance application(s) for Spiriva and Dulera , HIPAA compliant voicemail left.   Follow up:  Will make 2nd call attempt in 2-3 business days if call has not been returned.  Maud Deed Chana Bode [place] Certified Pharmacy Technician [place] Management Direct [phone]    ADDENDUM 3:36pm  Return call to patient, HIPAA identifiers verified. Patient states that he received patient assistance applications. He states that he received a copy of his income and will mail them back into [facility].  Will submit applications when all documents have been received.  Maud Deed Chana Bode [place] Certified Pharmacy Technician [place] Management Direct [phone]

## 2018-10-12 ENCOUNTER — Ambulatory Visit: Payer: Medicare Other | Admitting: Cardiovascular Disease

## 2018-10-14 LAB — CUP PACEART INCLINIC DEVICE CHECK
Brady Statistic RV Percent Paced: 1 % — CL
Date Time Interrogation Session: 20200211050000
HighPow Impedance: 102 Ohm
Implantable Lead Implant Date: 20130503
Implantable Lead Location: 753860
Implantable Lead Model: 292
Implantable Lead Serial Number: 112568
Implantable Pulse Generator Implant Date: 20130503
Lead Channel Impedance Value: 645 Ohm
Lead Channel Pacing Threshold Amplitude: 0.8 V
Lead Channel Pacing Threshold Pulse Width: 0.5 ms
Lead Channel Sensing Intrinsic Amplitude: 18.2 mV
Lead Channel Setting Pacing Amplitude: 2.4 V
Lead Channel Setting Pacing Pulse Width: 0.5 ms
Lead Channel Setting Sensing Sensitivity: 0.6 mV
Pulse Gen Serial Number: 105232

## 2018-10-22 ENCOUNTER — Other Ambulatory Visit: Payer: Self-pay | Admitting: Pharmacy Technician

## 2018-10-22 ENCOUNTER — Telehealth: Payer: Self-pay | Admitting: Internal Medicine

## 2018-10-22 NOTE — Patient Outreach (Signed)
[facility]) Care Management  10/22/2018  [name] 07-01-1956 [id number]   Received patient portion(s) of patient assistance application for [place]. Prepared Dulera application to be mailed to [employer] and faxed completed application and required documents into [employer].  Will follow up with [employer] in 14-21, [employer] in 7-10 business days to check status of applications.  Maud Deed Chana Bode [place] Certified Pharmacy Technician [place] Management Direct Dial:[phone]

## 2018-10-22 NOTE — Telephone Encounter (Signed)
Called patient for COVID-19 screening.  Have you recently traveled any where out of the local area in the last 2 weeks? no  Have you been in close contact with a person diagnosed with COVID-19 within the last 2 weeks?no  Do you currently have any fever, cough, or shortness of breath?no   Okay to proceed with visit.    

## 2018-10-23 ENCOUNTER — Telehealth: Payer: Self-pay

## 2018-10-23 ENCOUNTER — Telehealth: Payer: Self-pay | Admitting: Cardiovascular Disease

## 2018-10-23 NOTE — Telephone Encounter (Signed)
Patient called and scheduled video visit 4-7 at 11am with Dr Rockey Situ

## 2018-10-23 NOTE — Telephone Encounter (Signed)
LMOV for patient to call back. Patient need to be changed to a VIDEO visit.

## 2018-10-23 NOTE — Telephone Encounter (Signed)
Virtual Visit Pre-Appointment Phone Call  Steps For Call:  1. Confirm consent - "In the setting of the current Covid19 crisis, you are scheduled for a (phone or video) visit with your provider on (date) at (time).  Just as we do with many in-office visits, in order for you to participate in this visit, we must obtain consent.  If you'd like, I can send this to your mychart (if signed up) or email for you to review.  Otherwise, I can obtain your verbal consent now.  All virtual visits are billed to your insurance company just like a normal visit would be.  By agreeing to a virtual visit, we'd like you to understand that the technology does not allow for your provider to perform an examination, and thus may limit your provider's ability to fully assess your condition.  Finally, though the technology is pretty good, we cannot assure that it will always work on either your or our end, and in the setting of a video visit, we may have to convert it to a phone-only visit.  In either situation, we cannot ensure that we have a secure connection.  Are you willing to proceed?"  2. Give patient instructions for WebEx download to smartphone as below if video visit  3. Advise patient to be prepared with any vital sign or heart rhythm information, their current medicines, and a piece of paper and pen handy for any instructions they may receive the day of their visit  4. Inform patient they will receive a phone call 15 minutes prior to their appointment time (may be from unknown caller ID) so they should be prepared to answer  5. Confirm that appointment type is correct in Epic appointment notes (video vs telephone)    TELEPHONE CALL NOTE  Ian Mills has been deemed a candidate for a follow-up tele-health visit to limit community exposure during the Covid-19 pandemic. I spoke with the patient via phone to ensure availability of phone/video source, confirm preferred email & phone number, and discuss  instructions and expectations.  I reminded Ian Mills to be prepared with any vital sign and/or heart rhythm information that could potentially be obtained via home monitoring, at the time of his visit. I reminded Ian Mills to expect a phone call at the time of his visit if his visit.  Did the patient verbally acknowledge consent to treatment?  Yes  Ace Gins 10/23/2018 3:33 PM   DOWNLOADING THE [place], go to [employer] and type in WebEx in the search bar. [place] [employer], the blue/green circle. The app is free but as with any other app downloads, their phone may require them to verify saved payment information or Apple password. The patient does NOT have to create an account.  - If Android, ask patient to go to [employer] and type in WebEx in the search bar. [place] [employer], the blue/green circle. The app is free but as with any other app downloads, their phone may require them to verify saved payment information or Android password. The patient does NOT have to create an account.   CONSENT FOR TELE-HEALTH VISIT - PLEASE REVIEW  I hereby voluntarily request, consent and authorize [facility] and its employed or contracted physicians, physician assistants, nurse practitioners or other licensed health care professionals (the Practitioner), to provide me with telemedicine health care services (the Services") as deemed necessary by the treating Practitioner. I  acknowledge and consent to receive the Services by the Practitioner via telemedicine. I understand that the telemedicine visit will involve communicating with the Practitioner through live audiovisual communication technology and the disclosure of certain medical information by electronic transmission. I acknowledge that I have been given the opportunity to request an in-person assessment or other available alternative prior to the telemedicine visit and am  voluntarily participating in the telemedicine visit.  I understand that I have the right to withhold or withdraw my consent to the use of telemedicine in the course of my care at any time, without affecting my right to future care or treatment, and that the Practitioner or I may terminate the telemedicine visit at any time. I understand that I have the right to inspect all information obtained and/or recorded in the course of the telemedicine visit and may receive copies of available information for a reasonable fee.  I understand that some of the potential risks of receiving the Services via telemedicine include:   Delay or interruption in medical evaluation due to technological equipment failure or disruption;  Information transmitted may not be sufficient (e.g. poor resolution of images) to allow for appropriate medical decision making by the Practitioner; and/or   In rare instances, security protocols could fail, causing a breach of personal health information.  Furthermore, I acknowledge that it is my responsibility to provide information about my medical history, conditions and care that is complete and accurate to the best of my ability. I acknowledge that Practitioner's advice, recommendations, and/or decision may be based on factors not within their control, such as incomplete or inaccurate data provided by me or distortions of diagnostic images or specimens that may result from electronic transmissions. I understand that the practice of medicine is not an exact science and that Practitioner makes no warranties or guarantees regarding treatment outcomes. I acknowledge that I will receive a copy of this consent concurrently upon execution via email to the email address I last provided but may also request a printed copy by calling the office of [facility].    I understand that my insurance will be billed for this visit.   I have read or had this consent read to me.  I understand the  contents of this consent, which adequately explains the benefits and risks of the Services being provided via telemedicine.   I have been provided ample opportunity to ask questions regarding this consent and the Services and have had my questions answered to my satisfaction.  I give my informed consent for the services to be provided through the use of telemedicine in my medical care  By participating in this telemedicine visit I agree to the above.

## 2018-10-26 ENCOUNTER — Telehealth: Payer: Self-pay

## 2018-10-26 NOTE — Telephone Encounter (Signed)
LM for patient to call and let us know if he would like to proceed with telephone or televisit.

## 2018-10-29 ENCOUNTER — Ambulatory Visit: Payer: Medicare Other | Admitting: Internal Medicine

## 2018-11-02 ENCOUNTER — Telehealth: Payer: Self-pay

## 2018-11-02 NOTE — Telephone Encounter (Signed)
Patient called back.  "Trial Doxy" went perfect!  Video and audio worked.  Made him aware the his visit will go the same way tomorrow with Dr. Rockey Situ.

## 2018-11-02 NOTE — Telephone Encounter (Signed)
Patient was not available.  Left message for patient to call back.  Would like to do a trial doxy to make sure video and audio works.

## 2018-11-02 NOTE — Progress Notes (Addendum)
Virtual Visit via Telephone Note   This visit type was conducted due to national recommendations for restrictions regarding the COVID-19 Pandemic (e.g. social distancing) in an effort to limit this patient's exposure and mitigate transmission in our community.  Due to her co-morbid illnesses, this patient is at least at moderate risk for complications without adequate follow up.  This format is felt to be most appropriate for this patient at this time.  The patient did not have access to video technology/had technical difficulties with video requiring transitioning to audio format only (telephone).  All issues noted in this document were discussed and addressed.  No physical exam could be performed with this format.  Please refer to the patient's chart for her  consent to telehealth for [facility].    Date:  11/02/2018   ID:  Ian Mills, DOB 1956-02-15, MRN [id number]  Patient Location:  [place] [address]   Provider location:   [facility], [place] office  PCP:  Jodelle Green, FNP  Cardiologist:  Patsy Baltimore  Chief Complaint:  SOB, chest pain    History of Present Illness:    Ian Mills is a 63 y.o. male who presents via audio/video conferencing for a telehealth visit today.   The patient does not symptoms concerning for COVID-19 infection (fever, chills, cough, or new SHORTNESS OF BREATH).   Patient has a past medical history of Smoking, continues to smoke 1 pack a week coronary artery disease,  3 stents placed to his mid LAD, one stent to the PDA, one stent to the OM 3 in 08/2006  chest pressure during his divorce with stress test at [facility],  cardiac catheterization at [facility], 11/2011 syncope x4 episodes total over the past several years ECHO EF 25 to 30%, 2015 ICD, ejection fraction 30-35%, chronic back and hip pain He presents today for follow-up of his coronary artery disease.  Seen in the hospital February  2020 for COPD exacerbation Hospital records reviewed with the patient in detail PNA Treated with steroids  Better now Stopped smoking On an inhaler, nebs Has oxygen for exertion  Did kayak yesterday No regular exercise program, lives at a campsite, in an RV walks around the campsite for activity  Has all meds, has assistance for the Entresto through the company Denies any significant leg edema, PND orthopnea  Reviewed ICD downloads with him, no significant arrhythmia No shocks  Lab work reviewed creatinine 1.42 BUN 41 sodium 134  Back pain better  Previously with back and sciatic pain, improved with cortisone shots, two years ago seen by [facility] for chronic back pain   Prior CV studies:   The following studies were reviewed today:    Past Medical History:  Diagnosis Date  . Coronary artery disease    a. 3 stents to the LAD, 1 to the PDA, and 1 to the OM3; b. cath 06/2013: chronically occluded LAD, patent stent LAD, D1 20%, pLCx 30%, mLCx 20%, pRCA 70% (FFR 0.74) s/p PCI/DES, mRCA 70% s/p PCI/DES, RPDA 50%, EF 20% w/ AK of anterolateral & apical walls, moderately elevated LVEDP  . HLD (hyperlipidemia)   . HTN (hypertension)   . ICD  single,BSX    a. DOI 11/2011; b. S/N# [id number]  . Ischemic cardiomyopathy    a. echo: 05/2014: EF 25-30%, AK of mid-dist ant, apical and dist inf walls, mod LVH, DD, mod dilated LA, mildly dilated RA, mild MR, mild Ao  scl w/o stenosis    . Syncope and collapse   . Ventricular tachycardia ([place]) 11/2011   a. s/p AICD implant    Past Surgical History:  Procedure Laterality Date  . appendectomy    . CARDIAC CATHETERIZATION  Nov 30 2011   [facility]  . CARDIAC DEFIBRILLATOR PLACEMENT  May 2013  . CORONARY ANGIOPLASTY  2014   x 2 stents  . CORONARY STENT PLACEMENT     x7  . PROSTATE SURGERY    . TUMOR REMOVAL     chest     No outpatient medications have been marked as taking for the 11/03/18 encounter (Appointment) with Minna Merritts, MD.     Allergies:   Nitroglycerin   Social History   Tobacco Use  . Smoking status: Former Smoker    Packs/day: 0.50    Types: Cigarettes    Last attempt to quit: 09/03/2018    Years since quitting: 0.1  . Smokeless tobacco: Never Used  Substance Use Topics  . Alcohol use: Not Currently    Frequency: Never  . Drug use: No     Current Outpatient Medications on File Prior to Visit  Medication Sig Dispense Refill  . ALPRAZolam (XANAX) 0.5 MG tablet Take 1 tablet (0.5 mg total) by mouth 3 (three) times daily as needed for anxiety. 60 tablet 1  . amiodarone (PACERONE) 200 MG tablet Take 1.5 tablets (300 mg total) by mouth daily. 135 tablet 3  . aspirin 81 MG EC tablet Take 1 tablet (81 mg total) by mouth daily. 30 tablet 6  . atorvastatin (LIPITOR) 40 MG tablet Take 1 tablet by mouth once daily 30 tablet 0  . carvedilol (COREG) 3.125 MG tablet Take 1 tablet (3.125 mg total) by mouth 2 (two) times daily. 180 tablet 3  . clopidogrel (PLAVIX) 75 MG tablet Take 1 tablet (75 mg total) by mouth daily. 30 tablet 6  . ezetimibe (ZETIA) 10 MG tablet TAKE 1 TABLET BY MOUTH ONCE DAILY 90 tablet 0  . furosemide (LASIX) 40 MG tablet Take 1 tablet (40 mg total) by mouth 2 (two) times daily. 180 tablet 3  . ipratropium-albuterol (DUONEB) 0.5-2.5 (3) MG/3ML SOLN Take 3 mLs by nebulization every 6 (six) hours as needed. 360 mL 0  . mometasone-formoterol (DULERA) 100-5 MCG/ACT AERO Inhale 2 puffs into the lungs 2 (two) times daily. 1 Inhaler 1  . mometasone-formoterol (DULERA) 200-5 MCG/ACT AERO Inhale 2 puffs into the lungs 2 (two) times daily. 8.8 g 0  . potassium chloride (KLOR-CON 10) 10 MEQ tablet Take 1 tablet (10 mEq total) by mouth daily. 90 tablet 3  . predniSONE (DELTASONE) 10 MG tablet Take 50 mg daily taper by 10 mg daily then stop (Patient taking differently: Take 50 mg daily taper by 10 mg daily then stop ENDS 2/22) 15 tablet 0  . sacubitril-valsartan (ENTRESTO) 97-103 MG Take 1  tablet by mouth 2 (two) times daily. 60 tablet 3  . spironolactone (ALDACTONE) 25 MG tablet Take 1 tablet (25 mg total) by mouth daily. 90 tablet 3  . tadalafil (CIALIS) 10 MG tablet Take 1 tablet (10 mg total) by mouth daily as needed for erectile dysfunction (take 30 min prior to when needed.). 10 tablet 3  . tiotropium (SPIRIVA) 18 MCG inhalation capsule Place 1 capsule (18 mcg total) into inhaler and inhale daily for 30 days. (Patient not taking: Reported on 09/18/2018) 30 capsule 12   No current facility-administered medications on file prior to visit.  Family Hx: The patient's family history includes Cancer in his father; Cancer - Lung in his mother; Dementia in his maternal grandmother; Heart attack in his mother and paternal grandfather; Hypertension in his mother.  ROS:   Please see the history of present illness.    Review of Systems  Constitutional: Negative.   Respiratory: Positive for shortness of breath.   Cardiovascular: Negative.   Gastrointestinal: Negative.   Musculoskeletal: Negative.   Neurological: Negative.   Psychiatric/Behavioral: Negative.   All other systems reviewed and are negative.     Labs/Other Tests and Data Reviewed:    Recent Labs: 09/08/2018: Magnesium 2.2 09/09/2018: Hemoglobin 10.5; Platelets 213 09/12/2018: BUN 41; Creatinine, Ser 1.42; Potassium 4.0; Sodium 134   Recent Lipid Panel Lab Results  Component Value Date/Time   CHOL 170 12/09/2016 11:03 AM   CHOL 111 01/20/2014 09:29 AM   TRIG 113 12/09/2016 11:03 AM   TRIG 88 01/20/2014 09:29 AM   HDL 53 12/09/2016 11:03 AM   HDL 39 (L) 01/20/2014 09:29 AM   CHOLHDL 3.2 12/09/2016 11:03 AM   LDLCALC 94 12/09/2016 11:03 AM   LDLCALC 54 01/20/2014 09:29 AM    Wt Readings from Last 3 Encounters:  09/17/18 206 lb (93.4 kg)  09/15/18 206 lb 12.8 oz (93.8 kg)  09/08/18 190 lb (86.2 kg)     Exam:    Vital Signs: Vital signs as detailed above in HPI  Well nourished, well developed male  in no acute distress. Constitutional:  oriented to person, place, and time. No distress.     ASSESSMENT & PLAN:    Coronary artery disease involving native coronary artery of native heart without angina pectoris -  Denies symptoms of angina Recommended regular walking program Will order repeat lab work when viral outbreak is over Goal LDL less than 70  Spinal stenosis of lumbar region, unspecified whether neurogenic claudication present -  Followed at [facility] ,  Symptoms improved after cortisone shots Has not had any problems in 2 years, stable  Ventricular tachycardia ([place]) ICD downloads reviewed  Denies any shocks Once viral outbreak is over we will order Amiodarone surveillance labs (TSH, LFTs)  Ischemic cardiomyopathy Ejection fraction 30-35%,  no new symptoms Confirmed his medications with him New prescription sent in  Mixed hyperlipidemia Continue Lipitor and Zetia Goal LDL less than 70 Lipid panel when he is able  Single implantable cardioverter-defibrillator-BSx Followed by EP.  COPD Chronic shortness of breath Now on oxygen when walking long distances This was started after recent hospitalization February 2020 for pneumonia  Acute on chronic systolic CHF (congestive heart failure) ([place]) Sounds euvolemic  continue Lasix , Coreg , Entresto, Aldactone Recommended close monitoring of his fluid intake, salt intake Stressed medication compliance   COVID-19 Education: The signs and symptoms of COVID-19 were discussed with the patient and how to seek care for testing (follow up with PCP or arrange E-visit).  The importance of social distancing was discussed today.  Patient Risk:   After full review of this patients clinical status, I feel that they are at least moderate risk at this time.  Time:   Today, I have spent 25 minutes with the patient with telehealth technology discussing CHF management, recent hospital admission for multilobar  pneumonia, discussed COPD.     Medication Adjustments/Labs and Tests Ordered: Current medicines are reviewed at length with the patient today.  Concerns regarding medicines are outlined above.   Tests Ordered: No tests ordered   Medication Changes: No changes made  Disposition: Follow-up in 6 months   Signed, Ida Rogue, MD  11/02/2018 6:13 PM    [place] Office [address] [place] [address], [place], [place] [address]

## 2018-11-03 ENCOUNTER — Telehealth (INDEPENDENT_AMBULATORY_CARE_PROVIDER_SITE_OTHER): Payer: Medicare Other | Admitting: Cardiovascular Disease

## 2018-11-03 ENCOUNTER — Other Ambulatory Visit: Payer: Self-pay

## 2018-11-03 DIAGNOSIS — I25118 Atherosclerotic heart disease of native coronary artery with other forms of angina pectoris: Secondary | ICD-10-CM

## 2018-11-03 DIAGNOSIS — E782 Mixed hyperlipidemia: Secondary | ICD-10-CM | POA: Diagnosis not present

## 2018-11-03 DIAGNOSIS — I472 Ventricular tachycardia, unspecified: Secondary | ICD-10-CM

## 2018-11-03 DIAGNOSIS — I255 Ischemic cardiomyopathy: Secondary | ICD-10-CM | POA: Diagnosis not present

## 2018-11-03 DIAGNOSIS — I5023 Acute on chronic systolic (congestive) heart failure: Secondary | ICD-10-CM

## 2018-11-03 DIAGNOSIS — Z95810 Presence of automatic (implantable) cardiac defibrillator: Secondary | ICD-10-CM

## 2018-11-03 MED ORDER — CLOPIDOGREL BISULFATE 75 MG PO TABS: 75.0000 mg | ORAL_TABLET | Freq: Every day | ORAL | 3 refills | Status: DC

## 2018-11-03 MED ORDER — FUROSEMIDE 40 MG PO TABS: 40.0000 mg | ORAL_TABLET | Freq: Two times a day (BID) | ORAL | 3 refills | Status: DC

## 2018-11-03 MED ORDER — SACUBITRIL-VALSARTAN 97-103 MG PO TABS: 1.0000 | ORAL_TABLET | Freq: Two times a day (BID) | ORAL | 3 refills | Status: DC

## 2018-11-03 MED ORDER — AMIODARONE HCL 200 MG PO TABS: 300.0000 mg | ORAL_TABLET | Freq: Every day | ORAL | 3 refills | Status: DC

## 2018-11-03 MED ORDER — EZETIMIBE 10 MG PO TABS: 10.0000 mg | ORAL_TABLET | Freq: Every day | ORAL | 3 refills | Status: DC

## 2018-11-03 MED ORDER — ATORVASTATIN CALCIUM 40 MG PO TABS: 40.0000 mg | ORAL_TABLET | Freq: Every day | ORAL | 3 refills | Status: DC

## 2018-11-03 MED ORDER — POTASSIUM CHLORIDE ER 10 MEQ PO TBCR: 10.0000 meq | EXTENDED_RELEASE_TABLET | Freq: Every day | ORAL | 3 refills | Status: DC

## 2018-11-03 MED ORDER — CARVEDILOL 3.125 MG PO TABS: 3.1250 mg | ORAL_TABLET | Freq: Two times a day (BID) | ORAL | 3 refills | Status: DC

## 2018-11-03 MED ORDER — SPIRONOLACTONE 25 MG PO TABS: 25.0000 mg | ORAL_TABLET | Freq: Every day | ORAL | 3 refills | Status: DC

## 2018-11-03 NOTE — Patient Instructions (Addendum)
Medication Instructions:  No changes  Refills sent into your pharmacy.   If you need a refill on your cardiac medications before your next appointment, please call your pharmacy.    Lab work: No new labs needed   If you have labs (blood work) drawn today and your tests are completely normal, you will receive your results only by: Marland Kitchen MyChart Message (if you have MyChart) OR . A paper copy in the mail If you have any lab test that is abnormal or we need to change your treatment, we will call you to review the results.   Testing/Procedures: No new testing needed   Follow-Up: At [facility], you and your health needs are our priority.  As part of our continuing mission to provide you with exceptional heart care, we have created designated Provider Care Teams.  These Care Teams include your primary Cardiologist (physician) and Advanced Practice Providers (APPs -  Physician Assistants and Nurse Practitioners) who all work together to provide you with the care you need, when you need it.  . You will need a follow up appointment in 6 months .   Please call our office 2 months in advance to schedule this appointment.    . Providers on your designated Care Team:   . Murray Hodgkins, NP . Christell Faith, PA-C . Marrianne Mood, PA-C  Any Other Special Instructions Will Be Listed Below (If Applicable).  For educational health videos Log in to : [email or web address] Or : [email or web address], password : [email or web address]

## 2018-11-04 ENCOUNTER — Other Ambulatory Visit: Payer: Self-pay | Admitting: Pharmacy Technician

## 2018-11-04 NOTE — Patient Outreach (Signed)
[facility]) Care Management  11/04/2018  NAVY BELAY Apr 23, 1956 [id number]    Follow up call placed to [employer] regarding patient assistance application(s) for Spiriva , Jenny Reichmann confirms patient has been approved as of 3/30 and that medication was delivered on 4/8 to patients home.    Follow up call placed to [employer]  regarding patient assistance application(s) for Cassandria Anger confirms application has been received and attestation form to be mailed out 4/9.    Successful call placed to patient regarding patient assistance update for [facility] and Spiriva, HIPAA identifiers verified. Mr. Marengo confirms Spiriva received from [employer]. Informed him of [employer] mailing out attestation form and requested he contact me once received so that I may assist him with filling it out.   Follow up:  Will follow up with patient in 3-5 business days if call has not been returned.  Maud Deed Chana Bode [place] Certified Pharmacy Technician [place] Management Direct Dial:[phone]

## 2018-11-19 ENCOUNTER — Other Ambulatory Visit: Payer: Self-pay | Admitting: Family Medicine

## 2018-11-19 DIAGNOSIS — F419 Anxiety disorder, unspecified: Secondary | ICD-10-CM

## 2018-11-20 NOTE — Telephone Encounter (Signed)
Last OV 09/15/2018  Last refilled 09/25/2018 disp 60 with 1 refill   Next OV 12/14/2018  Sent to PCP for approval

## 2018-12-07 ENCOUNTER — Other Ambulatory Visit: Payer: Self-pay | Admitting: Pharmacy Technician

## 2018-12-07 NOTE — Patient Outreach (Signed)
[facility]) Care Management  12/07/2018  Ian Mills Jul 21, 1956 [id number]    Unsuccessful call #1 placed to patient regarding patient assistance receipt of attestation form from [employer] for [facility], HIPAA compliant voicemail left. Per last encounter, had spoken to [name] and requested that he contact me once attestation form has been received.  Follow up:  Will make call attempt in 2-3 business days if call has not been returned.  Maud Deed Chana Bode [place] Certified Pharmacy Technician [place] Management Direct Dial:[phone]

## 2018-12-14 ENCOUNTER — Ambulatory Visit (INDEPENDENT_AMBULATORY_CARE_PROVIDER_SITE_OTHER): Payer: Medicare Other | Admitting: Family Medicine

## 2018-12-14 ENCOUNTER — Encounter: Payer: Self-pay | Admitting: Family Medicine

## 2018-12-14 ENCOUNTER — Other Ambulatory Visit: Payer: Self-pay

## 2018-12-14 ENCOUNTER — Telehealth: Payer: Self-pay | Admitting: Family Medicine

## 2018-12-14 DIAGNOSIS — J449 Chronic obstructive pulmonary disease, unspecified: Secondary | ICD-10-CM

## 2018-12-14 DIAGNOSIS — F419 Anxiety disorder, unspecified: Secondary | ICD-10-CM | POA: Diagnosis not present

## 2018-12-14 DIAGNOSIS — I25118 Atherosclerotic heart disease of native coronary artery with other forms of angina pectoris: Secondary | ICD-10-CM | POA: Diagnosis not present

## 2018-12-14 DIAGNOSIS — I472 Ventricular tachycardia, unspecified: Secondary | ICD-10-CM

## 2018-12-14 NOTE — Progress Notes (Signed)
Patient ID: Ian Mills, male   DOB: 1956-04-24, 63 y.o.   MRN: [id number]    Virtual Visit via video Note  This visit type was conducted due to national recommendations for restrictions regarding the COVID-19 pandemic (e.g. social distancing).  This format is felt to be most appropriate for this patient at this time.  All issues noted in this document were discussed and addressed.  No physical exam was performed (except for noted visual exam findings with Video Visits).   I connected with Ian Mills today at  2:00 PM EDT by a video enabled telemedicine application and verified that I am speaking with the correct person using two identifiers. Location patient: home Location provider: [place] Persons participating in the virtual visit: patient, provider  I discussed the limitations, risks, security and privacy concerns of performing an evaluation and management service by VIDEO and the availability of in person appointments. I also discussed with the patient that there may be a patient responsible charge related to this service. The patient expressed understanding and agreed to proceed.   HPI:  Patient and I connected via video for follow-up on breathing, anxiety CHF/CAD.  Patient is feeling very well.  He has had no issues with his breathing over the past many weeks, and is happy to report he does not use oxygen all the time.  He will wear oxygen on occasion but does not need it 24/7 any longer.  Has not had any issues with feeling breathless, wheezing or cough.  He has been enjoying time in his cam[place] with his significant other.  Denies any issues with chest pain, lower extremity swelling, feeling faint or dizzy or palpitations.  States "my ticker is working great" He states the Lasix he is on keeps his leg swelling under control, has not checked his blood pressure recently however feels he is tolerating his medications without any problems.  He did follow-up with cardiology in  April, everything remains stable.  Anxiety is well controlled, uses Xanax as needed.  Does not feel overly anxious or down or depressed.  No SI or HI.  Does complain of fullness in ears at times, believes he may have wax buildup.  Would like an office appointment at some point in the next few weeks to look at ears and possibly get ear flush.  Denies pain in the ears.  No fever or chills.  ROS: See pertinent positives and negatives per HPI.  Past Medical History:  Diagnosis Date  . Coronary artery disease    a. 3 stents to the LAD, 1 to the PDA, and 1 to the OM3; b. cath 06/2013: chronically occluded LAD, patent stent LAD, D1 20%, pLCx 30%, mLCx 20%, pRCA 70% (FFR 0.74) s/p PCI/DES, mRCA 70% s/p PCI/DES, RPDA 50%, EF 20% w/ AK of anterolateral & apical walls, moderately elevated LVEDP  . HLD (hyperlipidemia)   . HTN (hypertension)   . ICD  single,BSX    a. DOI 11/2011; b. S/N# [id number]  . Ischemic cardiomyopathy    a. echo: 05/2014: EF 25-30%, AK of mid-dist ant, apical and dist inf walls, mod LVH, DD, mod dilated LA, mildly dilated RA, mild MR, mild Ao scl w/o stenosis    . Syncope and collapse   . Ventricular tachycardia ([place]) 11/2011   a. s/p AICD implant     Past Surgical History:  Procedure Laterality Date  . appendectomy    . CARDIAC CATHETERIZATION  Nov 30 2011   [facility]  . CARDIAC DEFIBRILLATOR  PLACEMENT  May 2013  . CORONARY ANGIOPLASTY  2014   x 2 stents  . CORONARY STENT PLACEMENT     x7  . PROSTATE SURGERY    . TUMOR REMOVAL     chest    Family History  Problem Relation Age of Onset  . Cancer Father        larynx  . Cancer - Lung Mother   . Hypertension Mother   . Heart attack Mother   . Dementia Maternal Grandmother   . Heart attack Paternal Grandfather    Social History   Tobacco Use  . Smoking status: Former Smoker    Packs/day: 0.50    Types: Cigarettes    Last attempt to quit: 09/03/2018    Years since quitting: 0.2  . Smokeless tobacco: Never Used   Substance Use Topics  . Alcohol use: Not Currently    Frequency: Never    Current Outpatient Medications:  .  ALPRAZolam (XANAX) 0.5 MG tablet, TAKE 1 TABLET BY MOUTH THREE TIMES DAILY AS NEEDED FOR ANXIETY., Disp: 60 tablet, Rfl: 2 .  amiodarone (PACERONE) 200 MG tablet, Take 1.5 tablets (300 mg total) by mouth daily., Disp: 135 tablet, Rfl: 3 .  aspirin 81 MG EC tablet, Take 1 tablet (81 mg total) by mouth daily., Disp: 30 tablet, Rfl: 6 .  atorvastatin (LIPITOR) 40 MG tablet, Take 1 tablet (40 mg total) by mouth daily., Disp: 90 tablet, Rfl: 3 .  carvedilol (COREG) 3.125 MG tablet, Take 1 tablet (3.125 mg total) by mouth 2 (two) times daily., Disp: 180 tablet, Rfl: 3 .  clopidogrel (PLAVIX) 75 MG tablet, Take 1 tablet (75 mg total) by mouth daily., Disp: 90 tablet, Rfl: 3 .  ezetimibe (ZETIA) 10 MG tablet, Take 1 tablet (10 mg total) by mouth daily., Disp: 90 tablet, Rfl: 3 .  furosemide (LASIX) 40 MG tablet, Take 1 tablet (40 mg total) by mouth 2 (two) times daily., Disp: 180 tablet, Rfl: 3 .  ipratropium-albuterol (DUONEB) 0.5-2.5 (3) MG/3ML SOLN, Take 3 mLs by nebulization every 6 (six) hours as needed., Disp: 360 mL, Rfl: 0 .  mometasone-formoterol (DULERA) 100-5 MCG/ACT AERO, Inhale 2 puffs into the lungs 2 (two) times daily., Disp: 1 Inhaler, Rfl: 1 .  mometasone-formoterol (DULERA) 200-5 MCG/ACT AERO, Inhale 2 puffs into the lungs 2 (two) times daily., Disp: 8.8 g, Rfl: 0 .  potassium chloride (KLOR-CON 10) 10 MEQ tablet, Take 1 tablet (10 mEq total) by mouth daily., Disp: 90 tablet, Rfl: 3 .  predniSONE (DELTASONE) 10 MG tablet, Take 50 mg daily taper by 10 mg daily then stop (Patient taking differently: Take 50 mg daily taper by 10 mg daily then stop ENDS 2/22), Disp: 15 tablet, Rfl: 0 .  sacubitril-valsartan (ENTRESTO) 97-103 MG, Take 1 tablet by mouth 2 (two) times daily., Disp: 180 tablet, Rfl: 3 .  spironolactone (ALDACTONE) 25 MG tablet, Take 1 tablet (25 mg total) by mouth  daily., Disp: 90 tablet, Rfl: 3 .  tadalafil (CIALIS) 10 MG tablet, Take 1 tablet (10 mg total) by mouth daily as needed for erectile dysfunction (take 30 min prior to when needed.)., Disp: 10 tablet, Rfl: 3 .  tiotropium (SPIRIVA) 18 MCG inhalation capsule, Place 1 capsule (18 mcg total) into inhaler and inhale daily for 30 days. (Patient not taking: Reported on 09/18/2018), Disp: 30 capsule, Rfl: 12  EXAM:  VITALS per patient if applicable:  GENERAL: alert, oriented, appears well and in no acute distress  HEENT: atraumatic, conjunttiva  clear, no obvious abnormalities on inspection of external nose and ears  NECK: normal movements of the head and neck  LUNGS: on inspection no signs of respiratory distress, breathing rate appears normal, no obvious gross SOB, gasping or wheezing  CV: no obvious cyanosis  MS: moves all visible extremities without noticeable abnormality  PSYCH/NEURO: pleasant and cooperative, no obvious depression or anxiety, speech and thought processing grossly intact  ASSESSMENT AND PLAN:  Discussed the following assessment and plan:  Anxiety  Coronary artery disease of native artery of native heart with stable angina pectoris (HCC)  Ventricular tachycardia (HCC)  Chronic obstructive pulmonary disease, unspecified COPD type ([place])    I discussed the assessment and treatment plan with the patient. The patient was provided an opportunity to ask questions and all were answered. The patient agreed with the plan and demonstrated an understanding of the instructions.   The patient was advised to call back or seek an in-person evaluation if the symptoms worsen or if the condition fails to improve as anticipated.  We will plan to have patient follow-up in the next few weeks for evaluation of ears and possible ear lavage to remove wax.  He is aware he can call office at any time if questions or concerns.  All of his medication refills are up-to-date he does not require  any refills today.   Jodelle Green, FNP

## 2018-12-14 NOTE — Telephone Encounter (Signed)
Please call patient to get him set up to come into office sometime in next few weeks -- thinks he needs his ears flushed

## 2018-12-14 NOTE — Telephone Encounter (Signed)
Appt scheduled for 01/04/2019 @10 :00am

## 2018-12-15 ENCOUNTER — Ambulatory Visit (INDEPENDENT_AMBULATORY_CARE_PROVIDER_SITE_OTHER): Payer: Medicare Other | Admitting: *Deleted

## 2018-12-15 ENCOUNTER — Other Ambulatory Visit: Payer: Self-pay | Admitting: Pharmacy Technician

## 2018-12-15 ENCOUNTER — Other Ambulatory Visit: Payer: Self-pay | Admitting: Pharmacist

## 2018-12-15 DIAGNOSIS — I472 Ventricular tachycardia, unspecified: Secondary | ICD-10-CM

## 2018-12-15 DIAGNOSIS — I255 Ischemic cardiomyopathy: Secondary | ICD-10-CM

## 2018-12-15 NOTE — Patient Outreach (Signed)
[place] [facility]) Care Management  12/15/2018  Ian Mills April 17, 1956 [id number]    Unsuccessful call #2 placed to patient regarding patient assistance receipt of attestation form from [employer] for [facility], HIPAA compliant voicemail left.   Had contacted patient and requested that he contact me once attestation form from [place] had been received so that I could assist him with filling form out. Patient did not return call.  Made multiple calls to patient to verify attestation form had been received with voicemail's left and multiple calls to [employer] to verify if attestation form had been mailed back in.  Follow up:  Will route note to [place] for case closure.  Maud Deed Chana Bode [place] Certified Pharmacy Technician [place] Management Direct Dial:[phone]

## 2018-12-15 NOTE — Patient Outreach (Signed)
[place] [facility]) Care Management  12/15/2018  HARDING THOMURE [birth year] [id number]  Received message from pharmacy technician, Caryl Pina, see note in chart from today, patient has not returned call regarding attestation form for [employer] patient assistance program from April 2020.  Pharmacy technician made two unsuccessful outreach attempts with messages left.    Plan:  Will close this patient's case due to inability to maintain contact.   Karrie Meres, PharmD, [place] [phone]

## 2018-12-16 LAB — CUP PACEART REMOTE DEVICE CHECK
Battery Remaining Longevity: 78 mo
Battery Remaining Percentage: 80 %
Brady Statistic RV Percent Paced: 0 %
Date Time Interrogation Session: 20200520024600
HighPow Impedance: 88 Ohm
Implantable Lead Implant Date: 20130503
Implantable Lead Location: 753860
Implantable Lead Model: 292
Implantable Lead Serial Number: 112568
Implantable Pulse Generator Implant Date: 20130503
Lead Channel Impedance Value: 621 Ohm
Lead Channel Pacing Threshold Amplitude: 0.8 V
Lead Channel Pacing Threshold Pulse Width: 0.5 ms
Lead Channel Setting Pacing Amplitude: 2.4 V
Lead Channel Setting Pacing Pulse Width: 0.5 ms
Lead Channel Setting Sensing Sensitivity: 0.6 mV
Pulse Gen Serial Number: 105232

## 2018-12-24 ENCOUNTER — Encounter: Payer: Self-pay | Admitting: Cardiology

## 2018-12-24 NOTE — Progress Notes (Signed)
Remote ICD transmission.   

## 2018-12-28 NOTE — Progress Notes (Signed)
[facility] Consultation     Virtual Visit via Telephone Note I connected with patient on 12/29/18 at 11:00 AM EDT by telephone and verified that I am speaking with the correct person using two identifiers.   I discussed the limitations, risks, security and privacy concerns of performing an evaluation and management service by telephone and the availability of in person appointments. I also discussed with the patient that there may be a patient responsible charge related to this service. The patient expressed understanding and agreed to proceed. I discussed the assessment and treatment plan with the patient. The patient was provided an opportunity to ask questions and all were answered. The patient agreed with the plan and demonstrated an understanding of the instructions. Please see note below for further detail.    The patient was advised to call back or seek an in-person evaluation if the symptoms worsen or if the condition fails to improve as anticipated.  Laverle Hobby, MD    Assessment and Plan:  COPD  exacerbation, now resolved.  Breathing back to baseline. Chronic hypoxic respiratory failure.  Now appears improved, patient is off oxygen, he checks his oxygen saturation at home which is normal. Dyspnea on exertion.  Now improved, patient is increase his physical activity. Nicotine abuse, quit before with chantix but not covered at that time.   -Continue Dulera, Spiriva.  Use nebs when needed. -Continue physical activity. -Discussed the importance smoke cessation, spent 3 minutes in discussion.  Will prescribe [name] to see if it is covered today. - Patient is on Medicare but does not have prescription coverage.  Will refer him to medication management for assistance in getting his inhalers. -PPSV 23 09/09/18. Referred to lung cancer screening.    Date: 12/28/2018  MRN# [id number] Ian Mills 1956/03/31    Ian Mills is a 63 y.o. old male seen  in consultation for chief complaint of: dyspnea.      HPI:   The patient is a 63 year old male, with a history of COPD, and hospitalization for COPD exacerbation in February 2020.  Last visit he was advised to smoke cessation he was continued on prednisone due to continued wheezing, asked to use Dulera and Spiriva.  He had trouble affording his medications and was referred to medication management for assistance. Since his last visit he feels that he is doing "a lot better" he is no longer on oxygen, he checks his sats at home and he usually runs about 98% on RA.  He is back to being active again, he is lives in an [place], he walks, [place] and is not limited by breathing issues.  He is using spiriva daily, dulera 2 puffs bid. He is still smoking about a half ppd, he is trying to cut. He has tried chantix samples with which he quit but the script was not covered at that time.  He is not using his nebs.     He has no pets, he denies reflux, does have sinus drainage.   He has mild cough, no hemoptysis.  He has an ICD, ejection fraction 30-35%, he has a history of ventricular tachycardia, syncope and ischemic cardiomyopathy.  CT chest 09/11/2017>> imaging personally reviewed, there is bilateral nasal mosaic groundglass changes, suggestive of acute infectious process.  Likely pneumonia.   Medication:    Current Outpatient Medications:  .  ALPRAZolam (XANAX) 0.5 MG tablet, TAKE 1 TABLET BY MOUTH THREE TIMES DAILY AS NEEDED FOR ANXIETY., Disp: 60 tablet, Rfl: 2 .  amiodarone (PACERONE) 200 MG tablet, Take 1.5 tablets (300 mg total) by mouth daily., Disp: 135 tablet, Rfl: 3 .  aspirin 81 MG EC tablet, Take 1 tablet (81 mg total) by mouth daily., Disp: 30 tablet, Rfl: 6 .  atorvastatin (LIPITOR) 40 MG tablet, Take 1 tablet (40 mg total) by mouth daily., Disp: 90 tablet, Rfl: 3 .  carvedilol (COREG) 3.125 MG tablet, Take 1 tablet (3.125 mg total) by mouth 2 (two) times daily., Disp: 180 tablet,  Rfl: 3 .  clopidogrel (PLAVIX) 75 MG tablet, Take 1 tablet (75 mg total) by mouth daily., Disp: 90 tablet, Rfl: 3 .  ezetimibe (ZETIA) 10 MG tablet, Take 1 tablet (10 mg total) by mouth daily., Disp: 90 tablet, Rfl: 3 .  furosemide (LASIX) 40 MG tablet, Take 1 tablet (40 mg total) by mouth 2 (two) times daily., Disp: 180 tablet, Rfl: 3 .  ipratropium-albuterol (DUONEB) 0.5-2.5 (3) MG/3ML SOLN, Take 3 mLs by nebulization every 6 (six) hours as needed., Disp: 360 mL, Rfl: 0 .  mometasone-formoterol (DULERA) 100-5 MCG/ACT AERO, Inhale 2 puffs into the lungs 2 (two) times daily., Disp: 1 Inhaler, Rfl: 1 .  mometasone-formoterol (DULERA) 200-5 MCG/ACT AERO, Inhale 2 puffs into the lungs 2 (two) times daily., Disp: 8.8 g, Rfl: 0 .  potassium chloride (KLOR-CON 10) 10 MEQ tablet, Take 1 tablet (10 mEq total) by mouth daily., Disp: 90 tablet, Rfl: 3 .  predniSONE (DELTASONE) 10 MG tablet, Take 50 mg daily taper by 10 mg daily then stop (Patient taking differently: Take 50 mg daily taper by 10 mg daily then stop ENDS 2/22), Disp: 15 tablet, Rfl: 0 .  sacubitril-valsartan (ENTRESTO) 97-103 MG, Take 1 tablet by mouth 2 (two) times daily., Disp: 180 tablet, Rfl: 3 .  spironolactone (ALDACTONE) 25 MG tablet, Take 1 tablet (25 mg total) by mouth daily., Disp: 90 tablet, Rfl: 3 .  tadalafil (CIALIS) 10 MG tablet, Take 1 tablet (10 mg total) by mouth daily as needed for erectile dysfunction (take 30 min prior to when needed.)., Disp: 10 tablet, Rfl: 3   Allergies:  Nitroglycerin  Review of Systems:  Constitutional: Feels well. Cardiovascular: Denies chest pain, exertional chest pain.  Pulmonary: Denies hemoptysis, pleuritic chest pain.   The remainder of systems were reviewed and were found to be negative other than what is documented in the HPI.        LABORATORY PANEL:   CBC No results for input(s): WBC, HGB, HCT, PLT in the last 168 hours.  ------------------------------------------------------------------------------------------------------------------  Chemistries  No results for input(s): NA, K, CL, CO2, GLUCOSE, BUN, CREATININE, CALCIUM, MG, AST, ALT, ALKPHOS, BILITOT in the last 168 hours.  Invalid input(s): GFRCGP ------------------------------------------------------------------------------------------------------------------  Cardiac Enzymes No results for input(s): TROPONINI in the last 168 hours. ------------------------------------------------------------  RADIOLOGY:  No results found.     Thank  you for the consultation and for allowing [facility] to assist in the care of your patient. Our recommendations are noted above.  Please contact us if we can be of further service.   Marda Stalker, M.D., F.C.C.P.  Board Certified in Internal Medicine, Pulmonary Medicine, [place], and Sleep Medicine.  [facility] Office Number: [phone]   12/28/2018

## 2018-12-29 ENCOUNTER — Ambulatory Visit (INDEPENDENT_AMBULATORY_CARE_PROVIDER_SITE_OTHER): Payer: Medicare Other | Admitting: Internal Medicine

## 2018-12-29 DIAGNOSIS — F17210 Nicotine dependence, cigarettes, uncomplicated: Secondary | ICD-10-CM | POA: Diagnosis not present

## 2018-12-29 DIAGNOSIS — I255 Ischemic cardiomyopathy: Secondary | ICD-10-CM | POA: Diagnosis not present

## 2018-12-29 DIAGNOSIS — J449 Chronic obstructive pulmonary disease, unspecified: Secondary | ICD-10-CM | POA: Diagnosis not present

## 2018-12-29 DIAGNOSIS — Z720 Tobacco use: Secondary | ICD-10-CM

## 2018-12-29 MED ORDER — VARENICLINE TARTRATE 0.5 MG X 11 & 1 MG X 42 PO MISC: ORAL | 0 refills | Status: DC

## 2018-12-29 NOTE — Patient Instructions (Addendum)
Will send chantix starter pack prescription to medication management to see if it will be covered.  Will refer to lung cancer screening.

## 2018-12-30 ENCOUNTER — Telehealth: Payer: Self-pay | Admitting: *Deleted

## 2018-12-30 NOTE — Telephone Encounter (Signed)
Received referral for low dose lung cancer screening CT scan. Message left at phone number listed in EMR for patient to call me back to facilitate scheduling scan.  

## 2019-01-04 ENCOUNTER — Other Ambulatory Visit: Payer: Self-pay

## 2019-01-04 ENCOUNTER — Ambulatory Visit (INDEPENDENT_AMBULATORY_CARE_PROVIDER_SITE_OTHER): Payer: Medicare Other | Admitting: Family Medicine

## 2019-01-04 ENCOUNTER — Encounter: Payer: Self-pay | Admitting: Family Medicine

## 2019-01-04 VITALS — BP 124/80 | HR 52 | Temp 98.0°F | Resp 18 | Ht 68.0 in | Wt 198.0 lb

## 2019-01-04 DIAGNOSIS — H6123 Impacted cerumen, bilateral: Secondary | ICD-10-CM

## 2019-01-04 NOTE — Progress Notes (Signed)
Subjective:    Patient ID: Ian Mills, male    DOB: 1956-04-24, 63 y.o.   MRN: [id number]  HPI   Patient presents to clinic due to fullness in ears and suspecting he has earwax buildup and needs an ear flush.  Patient denies pain in the ears or in sinuses.  Otherwise he has no complaints and is feeling well  Patient Active Problem List   Diagnosis Date Noted   COPD exacerbation ([place]) 09/08/2018   Acute on chronic systolic CHF (congestive heart failure) ([place]) 06/27/2016   Dizziness 03/30/2015   Smoker 09/29/2014   Syncope 06/01/2014   Insomnia 06/01/2014   Single implantable cardioverter-defibrillator-BSx    Ischemic cardiomyopathy    Ventricular tachycardia ([place]) 01/23/2012   SYNCOPE AND COLLAPSE 09/26/2010   Hyperlipidemia 02/23/2010   Hypotension 02/23/2010   CAD (coronary artery disease) 02/23/2010   Social History   Tobacco Use   Smoking status: Former Smoker    Packs/day: 0.50    Types: Cigarettes    Last attempt to quit: 09/03/2018    Years since quitting: 0.3   Smokeless tobacco: Never Used  Substance Use Topics   Alcohol use: Not Currently    Frequency: Never   Review of Systems  Constitutional: Negative for chills, fatigue and fever.  HENT: Negative for congestion, sinus pain and sore throat. +ear fullness, thinks wax Eyes: Negative.   Respiratory: Negative for cough, shortness of breath and wheezing.   Cardiovascular: Negative for chest pain, palpitations and leg swelling.  Gastrointestinal: Negative for abdominal pain, diarrhea, nausea and vomiting.  Genitourinary: Negative for dysuria, frequency and urgency.  Musculoskeletal: Negative for arthralgias and myalgias.  Skin: Negative for color change, pallor and rash.  Neurological: Negative for syncope, light-headedness and headaches.  Psychiatric/Behavioral: The patient is not nervous/anxious.       Objective:   Physical Exam Vitals signs and nursing note reviewed.  Constitutional:       General: He is not in acute distress.    Appearance: He is not ill-appearing, toxic-appearing or diaphoretic.  HENT:     Head: Normocephalic and atraumatic.     Right Ear: There is impacted cerumen.     Left Ear: There is impacted cerumen.     Ears:     Comments: Patient consented to having ears flushed in clinic.  Softener placed in both ears to help soften up hardened wax.  Ears excessively flushed using water pick tool and also hand-held tool to scoop out wax.  Was ear canals are clear of wax I can see tympanic membranes and they are normal, no signs of redness or ear infection.    Nose: Nose normal.     Mouth/Throat:     Mouth: Mucous membranes are moist.  Eyes:     General: No scleral icterus.    Extraocular Movements: Extraocular movements intact.     Conjunctiva/sclera: Conjunctivae normal.  Neck:     Musculoskeletal: Normal range of motion and neck supple. No neck rigidity.  Cardiovascular:     Rate and Rhythm: Normal rate and regular rhythm.  Pulmonary:     Effort: Pulmonary effort is normal. No respiratory distress.     Breath sounds: Normal breath sounds.  Musculoskeletal:     Right lower leg: No edema.     Left lower leg: No edema.  Skin:    General: Skin is warm and dry.     Coloration: Skin is not jaundiced or pale.  Neurological:     Mental Status:  He is alert and oriented to person, place, and time.  Psychiatric:        Mood and Affect: Mood normal.        Behavior: Behavior normal.    Vitals:   01/04/19 0958  BP: 124/80  Pulse: (!) 52  Resp: 18  Temp: 98 F (36.7 C)  SpO2: 97%      Assessment & Plan:    Impacted cerumen bilateral ear canals -successful ear flush performed in clinic.  No signs of ear infection present.  Patient given handout outlining tips in the future to help prevent earwax buildup.  Patient will return to clinic anytime if needed, he will otherwise schedule III month follow-up for management of chronic medical conditions.

## 2019-01-04 NOTE — Progress Notes (Deleted)
other

## 2019-01-04 NOTE — Patient Instructions (Signed)
Earwax Buildup, Adult  The ears produce a substance called earwax that helps keep bacteria out of the ear and protects the skin in the ear canal. Occasionally, earwax can build up in the ear and cause discomfort or hearing loss.  What increases the risk?  This condition is more likely to develop in people who:  · Are male.  · Are elderly.  · Naturally produce more earwax.  · Clean their ears often with cotton swabs.  · Use earplugs often.  · Use in-ear headphones often.  · Wear hearing aids.  · Have narrow ear canals.  · Have earwax that is overly thick or sticky.  · Have eczema.  · Are dehydrated.  · Have excess hair in the ear canal.  What are the signs or symptoms?  Symptoms of this condition include:  · Reduced or muffled hearing.  · A feeling of fullness in the ear or feeling that the ear is plugged.  · Fluid coming from the ear.  · Ear pain.  · Ear itch.  · Ringing in the ear.  · Coughing.  · An obvious piece of earwax that can be seen inside the ear canal.  How is this diagnosed?  This condition may be diagnosed based on:  · Your symptoms.  · Your medical history.  · An ear exam. During the exam, your health care provider will look into your ear with an instrument called an otoscope.  You may have tests, including a hearing test.  How is this treated?  This condition may be treated by:  · Using ear drops to soften the earwax.  · Having the earwax removed by a health care provider. The health care provider may:  ? Flush the ear with water.  ? Use an instrument that has a loop on the end (curette).  ? Use a suction device.  · Surgery to remove the wax buildup. This may be done in severe cases.  Follow these instructions at home:    · Take over-the-counter and prescription medicines only as told by your health care provider.  · Do not put any objects, including cotton swabs, into your ear. You can clean the opening of your ear canal with a washcloth or facial tissue.  · Follow instructions from your health care  provider about cleaning your ears. Do not over-clean your ears.  · Drink enough fluid to keep your urine clear or pale yellow. This will help to thin the earwax.  · Keep all follow-up visits as told by your health care provider. If earwax builds up in your ears often or if you use hearing aids, consider seeing your health care provider for routine, preventive ear cleanings. Ask your health care provider how often you should schedule your cleanings.  · If you have hearing aids, clean them according to instructions from the manufacturer and your health care provider.  Contact a health care provider if:  · You have ear pain.  · You develop a fever.  · You have blood, pus, or other fluid coming from your ear.  · You have hearing loss.  · You have ringing in your ears that does not go away.  · Your symptoms do not improve with treatment.  · You feel like the room is spinning (vertigo).  Summary  · Earwax can build up in the ear and cause discomfort or hearing loss.  · The most common symptoms of this condition include reduced or muffled hearing and a feeling of   fullness in the ear or feeling that the ear is plugged.  · This condition may be diagnosed based on your symptoms, your medical history, and an ear exam.  · This condition may be treated by using ear drops to soften the earwax or by having the earwax removed by a health care provider.  · Do not put any objects, including cotton swabs, into your ear. You can clean the opening of your ear canal with a washcloth or facial tissue.  This information is not intended to replace advice given to you by your health care provider. Make sure you discuss any questions you have with your health care provider.  Document Released: 08/22/2004 Document Revised: 06/26/2017 Document Reviewed: 09/25/2016  Elsevier Interactive Patient Education © 2019 [employer]vier [employer]

## 2019-01-08 ENCOUNTER — Telehealth: Payer: Self-pay | Admitting: *Deleted

## 2019-01-08 NOTE — Telephone Encounter (Signed)
Received referral for low dose lung cancer screening CT scan. Message left at phone number listed in EMR for patient to call me back to facilitate scheduling scan.  

## 2019-01-11 ENCOUNTER — Telehealth: Payer: Self-pay | Admitting: *Deleted

## 2019-01-11 ENCOUNTER — Telehealth: Payer: Self-pay | Admitting: Internal Medicine

## 2019-01-11 ENCOUNTER — Other Ambulatory Visit: Payer: Self-pay | Admitting: Pharmacy Technician

## 2019-01-11 DIAGNOSIS — Z87891 Personal history of nicotine dependence: Secondary | ICD-10-CM

## 2019-01-11 DIAGNOSIS — Z122 Encounter for screening for malignant neoplasm of respiratory organs: Secondary | ICD-10-CM

## 2019-01-11 MED ORDER — DULERA 200-5 MCG/ACT IN AERO: 2.0000 | INHALATION_SPRAY | Freq: Two times a day (BID) | RESPIRATORY_TRACT | 0 refills | Status: DC

## 2019-01-11 NOTE — Telephone Encounter (Signed)
Received referral for initial lung cancer screening scan. Contacted patient and obtained smoking history,(former, quit 09/03/18, 42 pack year) as well as answering questions related to screening process. Patient denies signs of lung cancer such as weight loss or hemoptysis. Patient denies comorbidity that would prevent curative treatment if lung cancer were found. Patient is scheduled for shared decision making visit and CT scan on 01/19/19 at 130pm.

## 2019-01-11 NOTE — Telephone Encounter (Signed)
Called and spoke to pt, who is requesting samples of dulera.  Pt is aware that we do not currently have samples.  Pt requested that a Rx for dulera 200 be sent to pharmacy. Rx has been sent to preferred pharmacy. Nothing further is needed.

## 2019-01-11 NOTE — Patient Outreach (Signed)
[place] [facility]) Care Management  01/11/2019  Ian Mills Dec 22, 1955 [id number]   Incoming call from patient stating that he is running low on his [facility] inhaler. Informed him that I had tried to outreach him multiple times in regards to the letter that [employer] had mailed out to him that he needed to fill out and submit back to company for approval. He states he has had a lot going on and that he probably misplaced it. Informed him that we would have to restart the patient assistance process again and that it could take about 1-2 months for full completion due to completed. He stated he does want to start the process again.  Will prepare application to be mailed out and will send provider portion to Dr. Ashby Dawes for completion.  Will follow up with patient in 7-10 business days to confirm application has been received.  Maud Deed Chana Bode [place] Certified Pharmacy Technician [place] Management Direct Dial:[phone]

## 2019-01-14 MED ORDER — DULERA 200-5 MCG/ACT IN AERO: 2.0000 | INHALATION_SPRAY | Freq: Two times a day (BID) | RESPIRATORY_TRACT | 0 refills | Status: DC

## 2019-01-14 NOTE — Telephone Encounter (Signed)
Rx for Dulera 200 has been resent to pharmacy.  Pt is aware and voiced his understanding.  Nothing further is needed.

## 2019-01-14 NOTE — Telephone Encounter (Signed)
Pt called and stated that pharmacy still does not have rx, as of yesterday. Wants to follow up.  [place], [place]

## 2019-01-14 NOTE — Addendum Note (Signed)
Addended by: Maryanna Shape A on: 01/14/2019 11:31 AM   Modules accepted: Orders

## 2019-01-18 ENCOUNTER — Encounter: Payer: Self-pay | Admitting: Hospice and Palliative Medicine

## 2019-01-19 ENCOUNTER — Inpatient Hospital Stay: Payer: Medicare Other | Attending: Oncology | Admitting: Hospice and Palliative Medicine

## 2019-01-19 ENCOUNTER — Ambulatory Visit
Admission: RE | Admit: 2019-01-19 | Discharge: 2019-01-19 | Disposition: A | Discharge status: Home / Self Care | Payer: Medicare Other | Source: Ambulatory Visit | Attending: Oncology | Admitting: Oncology

## 2019-01-19 ENCOUNTER — Other Ambulatory Visit: Payer: Self-pay

## 2019-01-19 DIAGNOSIS — Z122 Encounter for screening for malignant neoplasm of respiratory organs: Secondary | ICD-10-CM | POA: Diagnosis not present

## 2019-01-19 DIAGNOSIS — Z87891 Personal history of nicotine dependence: Secondary | ICD-10-CM | POA: Diagnosis not present

## 2019-01-19 NOTE — Progress Notes (Signed)
In accordance with CMS guidelines, patient has met eligibility criteria including age, absence of signs or symptoms of lung cancer.  Social History   Tobacco Use  . Smoking status: Former Smoker    Packs/day: 1.00    Years: 42.00    Pack years: 42.00    Types: Cigarettes    Quit date: 09/03/2018    Years since quitting: 0.3  . Smokeless tobacco: Never Used  Substance Use Topics  . Alcohol use: Not Currently    Frequency: Never  . Drug use: No      A shared decision-making session was conducted prior to the performance of CT scan. This includes one or more decision aids, includes benefits and harms of screening, follow-up diagnostic testing, over-diagnosis, false positive rate, and total radiation exposure.   Counseling on the importance of adherence to annual lung cancer LDCT screening, impact of co-morbidities, and ability or willingness to undergo diagnosis and treatment is imperative for compliance of the program.   Counseling on the importance of continued smoking cessation for former smokers; the importance of smoking cessation for current smokers, and information about tobacco cessation interventions have been given to patient including [place] and [address] [facility] [place] programs.   Written order for lung cancer screening with LDCT has been given to the patient and any and all questions have been answered to the best of my abilities.    Yearly follow up will be coordinated by Burgess Estelle, Thoracic Navigator.  Time Total: 15 minutes  Visit consisted of counseling and education dealing with complex health screening. Greater than 50%  of this time was spent counseling and coordinating care related to the above assessment and plan.  Signed by: Altha Harm, PhD, NP-C [phone] (Work Cell)

## 2019-01-20 ENCOUNTER — Encounter: Payer: Self-pay | Admitting: *Deleted

## 2019-02-06 ENCOUNTER — Other Ambulatory Visit: Payer: Self-pay | Admitting: Family Medicine

## 2019-02-06 DIAGNOSIS — F419 Anxiety disorder, unspecified: Secondary | ICD-10-CM

## 2019-02-08 ENCOUNTER — Other Ambulatory Visit: Payer: Self-pay | Admitting: Lab

## 2019-02-08 ENCOUNTER — Other Ambulatory Visit: Payer: Self-pay | Admitting: Pharmacy Technician

## 2019-02-08 DIAGNOSIS — F419 Anxiety disorder, unspecified: Secondary | ICD-10-CM

## 2019-02-08 MED ORDER — ALPRAZOLAM 0.5 MG PO TABS: 0.5000 mg | ORAL_TABLET | Freq: Three times a day (TID) | ORAL | 2 refills | Status: DC | PRN

## 2019-02-08 NOTE — Patient Outreach (Signed)
[place] [facility]) Care Management  02/08/2019  Ian Mills Aug 21, 1955 [id number]    Successful call placed to patient regarding patient assistance application(s) for [facility] , HIPAA identifiers verified. Mr. Verba states that he has not received patient assistance application that was mailed out to him. Will prepare new [employer] application to be mailed to patient.  Follow up:  Will follow up with patient in 7-10 business days to confirm application has been received.  Maud Deed Chana Bode [place] Certified Pharmacy Technician [place] Management Direct [phone]

## 2019-02-10 ENCOUNTER — Other Ambulatory Visit: Payer: Self-pay | Admitting: Pharmacy Technician

## 2019-02-10 NOTE — Patient Outreach (Signed)
[place] [facility]) Care Management  02/10/2019  Ian Mills 06/15/1956 [id number]   Received patient portion(s) of patient assistance application for [facility]. Prepared to mail completed application and required documents into [employer].  Will follow up with company in 10-14 business days to check status of application.  Maud Deed Chana Bode [place] Certified Pharmacy Technician [place] Management Direct Dial:[phone]

## 2019-02-23 ENCOUNTER — Other Ambulatory Visit: Payer: Self-pay | Admitting: Pharmacy Technician

## 2019-02-23 NOTE — Patient Outreach (Signed)
[place] [facility]) Care Management  02/23/2019  DERRIAN POLI 21-Jan-1956 [id number]   Follow up call placed to [employer] regarding patient assistance application(s) for [facility] confirms patient has been approved as of 7/23 until 02/17/20 due to patient not having Medicare Part D plan. Medication to arrive at patients home in 10-14 business days.   Follow up:  Will follow up with patient in 10-14 business days to confirm medication has been received.  Maud Deed Chana Bode [place] Certified Pharmacy Technician [place] Management Direct Dial:[phone]

## 2019-03-16 ENCOUNTER — Ambulatory Visit (INDEPENDENT_AMBULATORY_CARE_PROVIDER_SITE_OTHER): Payer: Medicare Other | Admitting: *Deleted

## 2019-03-16 DIAGNOSIS — I472 Ventricular tachycardia, unspecified: Secondary | ICD-10-CM

## 2019-03-17 LAB — CUP PACEART REMOTE DEVICE CHECK
Battery Remaining Longevity: 72 mo
Battery Remaining Percentage: 78 %
Brady Statistic RV Percent Paced: 0 %
Date Time Interrogation Session: 20200819021500
HighPow Impedance: 81 Ohm
Implantable Lead Implant Date: 20130503
Implantable Lead Location: 753860
Implantable Lead Model: 292
Implantable Lead Serial Number: 112568
Implantable Pulse Generator Implant Date: 20130503
Lead Channel Impedance Value: 627 Ohm
Lead Channel Pacing Threshold Amplitude: 0.8 V
Lead Channel Pacing Threshold Pulse Width: 0.5 ms
Lead Channel Setting Pacing Amplitude: 2.4 V
Lead Channel Setting Pacing Pulse Width: 0.5 ms
Lead Channel Setting Sensing Sensitivity: 0.6 mV
Pulse Gen Serial Number: 105232

## 2019-03-22 ENCOUNTER — Other Ambulatory Visit: Payer: Self-pay | Admitting: Pharmacy Technician

## 2019-03-22 NOTE — Patient Outreach (Signed)
[place] [facility]) Care Management  03/22/2019  DONTERIO PAPPALARDO Oct 21, 1955 [id number]    Unsuccessful call placed to patient regarding patient assistance medication delivery of Dulera, HIPAA compliant voicemail left.   Follow up:  Will follow up with patient in 7-10 business days if call has not been returned  Maud Deed. Chana Bode [place] Certified Pharmacy Technician [place] Management Direct Dial:[phone]

## 2019-03-23 ENCOUNTER — Other Ambulatory Visit: Payer: Self-pay | Admitting: Pharmacy Technician

## 2019-03-23 NOTE — Patient Outreach (Signed)
[place] [facility]) Care Management  03/23/2019  Ian Mills 19-Nov-1955 [id number]    Incoming call from patient regarding patient assistance medication delivery of [facility], HIPAA identifiers verified. [name] confirms that he received 3 boxes of medication from [employer]. Reviewed with patient on obtaining refills and re-iterated that he has been approved for program until 02/17/20.   Follow up:  With verification of medication being received, will remove myself from care team.  Maud Deed. Chana Bode [place] Certified Pharmacy Technician [place] Management Direct [phone]

## 2019-03-25 ENCOUNTER — Encounter: Payer: Self-pay | Admitting: Cardiology

## 2019-03-25 NOTE — Progress Notes (Signed)
Remote ICD transmission.   

## 2019-04-13 ENCOUNTER — Ambulatory Visit (INDEPENDENT_AMBULATORY_CARE_PROVIDER_SITE_OTHER): Payer: Medicare Other

## 2019-04-13 ENCOUNTER — Other Ambulatory Visit: Payer: Self-pay

## 2019-04-13 DIAGNOSIS — Z0000 Encounter for general adult medical examination without abnormal findings: Secondary | ICD-10-CM | POA: Diagnosis not present

## 2019-04-13 NOTE — Patient Instructions (Addendum)
  Ian Mills , Thank you for taking time to come for your Medicare Wellness Visit. I appreciate your ongoing commitment to your health goals. Please review the following plan we discussed and let me know if I can assist you in the future.   These are the goals we discussed: Goals    . Follow up with Provider as scheduled       This is a list of the screening recommended for you and due dates:  Health Maintenance  Topic Date Due  .  Hepatitis C: One time screening is recommended by Center for Disease Control  (CDC) for  adults born from [birth year] through 1967.   1956/01/02  . Urine Protein Check  08/16/1963  . Flu Shot  10/27/2019*  . Tetanus Vaccine  04/12/2020*  . Pneumonia vaccines (2 of 2 - PCV13) 09/10/2019  . Colon Cancer Screening  08/18/2020  . HIV Screening  Completed  *Topic was postponed. The date shown is not the original due date.

## 2019-04-13 NOTE — Progress Notes (Signed)
Subjective:   Ian Mills is a 63 y.o. male who presents for an Initial Medicare Annual Wellness Visit.  Review of Systems  No ROS.  Medicare Wellness Virtual Visit.  Visual/audio telehealth visit, UTA vital signs.   See social history for additional risk factors.   Cardiac Risk Factors include: advanced age (>29men, >79 women)    Objective:    Today's Vitals   There is no height or weight on file to calculate BMI.  Advanced Directives 04/13/2019 09/18/2018 09/08/2018 09/08/2018 03/06/2015  Does Patient Have a Medical Advance Directive? Yes Yes Yes Yes Yes  Type of Paramedic of [place];Living will [place];Living will [place];Living will Living will;Healthcare Power of [place]  Does patient want to make changes to medical advance directive? - No - Patient declined No - Patient declined - -  Copy of [place] in Chart? Yes - validated most recent copy scanned in chart (See row information) Yes - validated most recent copy scanned in chart (See row information) No - copy requested - -    Current Medications (verified) Outpatient Encounter Medications as of 04/13/2019  Medication Sig  . ALPRAZolam (XANAX) 0.5 MG tablet Take 1 tablet (0.5 mg total) by mouth 3 (three) times daily as needed. for anxiety  . amiodarone (PACERONE) 200 MG tablet Take 1.5 tablets (300 mg total) by mouth daily.  Marland Kitchen aspirin 81 MG EC tablet Take 1 tablet (81 mg total) by mouth daily.  Marland Kitchen atorvastatin (LIPITOR) 40 MG tablet Take 1 tablet (40 mg total) by mouth daily.  . carvedilol (COREG) 3.125 MG tablet Take 1 tablet (3.125 mg total) by mouth 2 (two) times daily.  . clopidogrel (PLAVIX) 75 MG tablet Take 1 tablet (75 mg total) by mouth daily.  Marland Kitchen ezetimibe (ZETIA) 10 MG tablet Take 1 tablet (10 mg total) by mouth daily.  . furosemide (LASIX) 40 MG tablet Take 1 tablet (40 mg total) by mouth 2 (two)  times daily.  Marland Kitchen ipratropium-albuterol (DUONEB) 0.5-2.5 (3) MG/3ML SOLN Take 3 mLs by nebulization every 6 (six) hours as needed.  . mometasone-formoterol (DULERA) 100-5 MCG/ACT AERO Inhale 2 puffs into the lungs 2 (two) times daily.  . mometasone-formoterol (DULERA) 200-5 MCG/ACT AERO Inhale 2 puffs into the lungs 2 (two) times daily.  . potassium chloride (KLOR-CON 10) 10 MEQ tablet Take 1 tablet (10 mEq total) by mouth daily.  . sacubitril-valsartan (ENTRESTO) 97-103 MG Take 1 tablet by mouth 2 (two) times daily.  Marland Kitchen spironolactone (ALDACTONE) 25 MG tablet Take 1 tablet (25 mg total) by mouth daily.  . tadalafil (CIALIS) 10 MG tablet Take 1 tablet (10 mg total) by mouth daily as needed for erectile dysfunction (take 30 min prior to when needed.).  . [DISCONTINUED] predniSONE (DELTASONE) 10 MG tablet Take 50 mg daily taper by 10 mg daily then stop (Patient taking differently: Take 50 mg daily taper by 10 mg daily then stop ENDS 2/22)  . varenicline ([name] STARTING MONTH PAK) 0.5 MG X 11 & 1 MG X 42 tablet Take one 0.5 mg tablet by mouth once daily for 3 days, then increase to one 0.5 mg tablet twice daily for 4 days, then increase to one 1 mg tablet twice daily. (Patient not taking: Reported on 04/13/2019)   No facility-administered encounter medications on file as of 04/13/2019.     Allergies (verified) Nitroglycerin   History: Past Medical History:  Diagnosis Date  .  Coronary artery disease    a. 3 stents to the LAD, 1 to the PDA, and 1 to the OM3; b. cath 06/2013: chronically occluded LAD, patent stent LAD, D1 20%, pLCx 30%, mLCx 20%, pRCA 70% (FFR 0.74) s/p PCI/DES, mRCA 70% s/p PCI/DES, RPDA 50%, EF 20% w/ AK of anterolateral & apical walls, moderately elevated LVEDP  . HLD (hyperlipidemia)   . HTN (hypertension)   . ICD  single,BSX    a. DOI 11/2011; b. S/N# [id number]  . Ischemic cardiomyopathy    a. echo: 05/2014: EF 25-30%, AK of mid-dist ant, apical and dist inf walls, mod LVH, DD,  mod dilated LA, mildly dilated RA, mild MR, mild Ao scl w/o stenosis    . Syncope and collapse   . Ventricular tachycardia ([place]) 11/2011   a. s/p AICD implant    Past Surgical History:  Procedure Laterality Date  . appendectomy    . CARDIAC CATHETERIZATION  Nov 30 2011   [facility]  . CARDIAC DEFIBRILLATOR PLACEMENT  May 2013  . CORONARY ANGIOPLASTY  2014   x 2 stents  . CORONARY STENT PLACEMENT     x7  . PROSTATE SURGERY    . TUMOR REMOVAL     chest   Family History  Problem Relation Age of Onset  . Cancer Father        larynx  . Cancer - Lung Mother   . Hypertension Mother   . Heart attack Mother   . Dementia Maternal Grandmother   . Heart attack Paternal Grandfather    Social History   Socioeconomic History  . Marital status: Married    Spouse name: Not on file  . Number of children: Not on file  . Years of education: Not on file  . Highest education level: Not on file  Occupational History  . Not on file  Social Needs  . Financial resource strain: Not hard at all  . Food insecurity    Worry: Never true    Inability: Never true  . Transportation needs    Medical: No    Non-medical: No  Tobacco Use  . Smoking status: Current Every Day Smoker    Packs/day: 1.00    Years: 42.00    Pack years: 42.00    Types: Cigarettes    Last attempt to quit: 09/03/2018    Years since quitting: 0.6  . Smokeless tobacco: Never Used  Substance and Sexual Activity  . Alcohol use: Yes    Alcohol/week: 1.0 standard drinks    Types: 1 Cans of beer per week    Frequency: Never    Comment: per week  . Drug use: No  . Sexual activity: Not on file  Lifestyle  . Physical activity    Days per week: Not on file    Minutes per session: Not on file  . Stress: Not at all  Relationships  . Social Herbalist on phone: Not on file    Gets together: Not on file    Attends religious service: Not on file    Active member of club or organization: Not on file    Attends meetings of  clubs or organizations: Not on file    Relationship status: Not on file  Other Topics Concern  . 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    Tobacco Counseling Ready to quit: Not Answered Counseling  given: Not Answered   Clinical Intake:  Pre-visit preparation completed: Yes        Diabetes: No  How often do you need to have someone help you when you read instructions, pamphlets, or other written materials from your doctor or pharmacy?: 1 - Never  Interpreter Needed?: No     Activities of Daily Living In your present state of health, do you have any difficulty performing the following activities: 04/13/2019 09/08/2018  Hearing? N N  Vision? N N  Difficulty concentrating or making decisions? N N  Walking or climbing stairs? N N  Dressing or bathing? N N  Doing errands, shopping? N N  Preparing Food and eating ? N -  Using the Toilet? N -  In the past six months, have you accidently leaked urine? N -  Do you have problems with loss of bowel control? N -  Managing your Medications? N -  Managing your Finances? N -  Housekeeping or managing your Housekeeping? N -  Some recent data might be hidden     Immunizations and Health Maintenance Immunization History  Administered Date(s) Administered  . Pneumococcal Polysaccharide-23 09/09/2018   Health Maintenance Due  Topic Date Due  . Hepatitis C Screening  1955/10/09  . URINE MICROALBUMIN  08/16/1963    Patient Care Team: Jodelle Green, FNP as PCP - General (Family Medicine) Rockey Situ Kathlene November, MD as Consulting Physician (Cardiology)  Indicate any recent Medical Services you may have received from other than [facility] providers in the past year (date may be approximate).    Assessment:   This is a routine wellness examination for [place].  I connected with patient 04/13/19 at 12:30 PM EDT by an audio enabled telemedicine application and  verified that I am speaking with the correct person using two identifiers. Patient stated full name and DOB. Patient gave permission to continue with virtual visit. Patient's location was at home and Nurse's location was at [place] office.   Health Maintenance Due: -Influenza vaccine 2020- discussed; to be completed in season with doctor or local pharmacy.   -Hep C Screening- discussed. He plans to complete with his labs scheduled through cardiology.  -Tdap- discussed; to be completed with doctor in visit or local pharmacy. See completed HM at the end of note.   Eye: Visual acuity not assessed. Virtual visit. Wears corrective lenses. Followed by their ophthalmologist every 12 months.   Dental: UTD  Hearing: Demonstrates normal hearing during visit.  Safety:  Patient feels safe at home- yes Patient does have smoke detectors at home- yes Patient does wear sunscreen or protective clothing when in direct sunlight - yes Patient does wear seat belt when in a moving vehicle - yes Patient drives-  Adequate lighting in walkways free from debris- yes Grab bars and handrails used as appropriate- yes Ambulates with no assistive device Cell phone on person when ambulating outside of the home- yes  Social: Alcohol intake - yes, 1 beer per week      Smoking history- current; ready to quit but unaffordable. [facility] has free smoking cessation program [phone]; deferred to pcp for follow up.  Illicit drug use? none  Depression: PHQ 2 &9 complete. See screening below. Denies irritability, anhedonia, sadness/tearfullness.  Stable.   Falls: See screening below.    Medication: Taking as directed and without issues.   Covid-19: Precautions and sickness symptoms discussed. Wears mask, social distancing, hand hygiene as appropriate.   Activities of Daily Living Patient denies needing assistance with: household  chores, feeding themselves, getting from bed to chair, getting to the toilet,  bathing/showering, dressing, managing money, or preparing meals.   Memory: Patient is alert. Patient denies difficulty focusing or concentrating. Correctly identified the president of the [place], season and recall. Patient likes to play brain games for brain stimulation.  BMI- discussed the importance of a healthy diet, water intake and the benefits of aerobic exercise.  Educational material provided.  Physical activity- walking  Diet:  Regular Water: notes he needs to drink more Caffeine: 12-18 ounces sodas per day  Advanced Directive: End of life planning; Advance aging; Advanced directives discussed.  Copy of current HCPOA/Living Will on file.    Other Providers Patient Care Team: Jodelle Green, FNP as PCP - General (Family Medicine) Minna Merritts, MD as Consulting Physician (Cardiology)  Hearing/Vision screen  Hearing Screening   125Hz  250Hz  500Hz  1000Hz  2000Hz  3000Hz  4000Hz  6000Hz  8000Hz   Right ear:           Left ear:           Comments: Patient is able to hear conversational tones without difficulty.  No issues reported.  Followed by Visits every 4 months  Hearing aid, bilateral   Vision Screening Comments: Wears corrective lenses Visual acuity not assessed, virtual visit.  They have seen their ophthalmologist in the last 12 months.     Dietary issues and exercise activities discussed: Current Exercise Habits: Home exercise routine, Intensity: Moderate  Goals    . Follow up with Provider as scheduled      Depression Screen PHQ 2/9 Scores 04/13/2019 01/04/2019 12/14/2018 09/18/2018  PHQ - 2 Score 0 0 0 0  PHQ- 9 Score - 0 0 -    Fall Risk Fall Risk  04/13/2019 01/04/2019  Falls in the past year? 0 0  Number falls in past yr: - 0  Injury with Fall? - 0  Follow up - Falls evaluation completed   Timed Get Up and Go performed: no, virtual visit  Cognitive Function:     6CIT Screen 04/13/2019  What Year? 0 points  What month? 0 points  What time? 0 points   Count back from 20 0 points  Months in reverse 0 points    Screening Tests Health Maintenance  Topic Date Due  . Hepatitis C Screening  01-Oct-1955  . URINE MICROALBUMIN  08/16/1963  . INFLUENZA VACCINE  10/27/2019 (Originally 02/27/2019)  . TETANUS/TDAP  04/12/2020 (Originally 08/15/1972)  . PNA vac Low Risk Adult (2 of 2 - PCV13) 09/10/2019  . COLONOSCOPY  08/18/2020  . HIV Screening  Completed      Plan:   Keep all routine maintenance appointments.   Follow up 04/15/19 @1 :20  Medicare Attestation I have personally reviewed: The patient's medical and social history Their use of alcohol, tobacco or illicit drugs Their current medications and supplements The patient's functional ability including ADLs,fall risks, home safety risks, cognitive, and hearing and visual impairment Diet and physical activities Evidence for depression   In addition, I have reviewed and discussed with patient certain preventive protocols, quality metrics, and best practice recommendations. A written personalized care plan for preventive services as well as general preventive health recommendations were provided to patient via mail.     Varney Biles, LPN   [phone]

## 2019-04-15 ENCOUNTER — Other Ambulatory Visit: Payer: Self-pay

## 2019-04-15 ENCOUNTER — Ambulatory Visit (INDEPENDENT_AMBULATORY_CARE_PROVIDER_SITE_OTHER): Payer: Medicare Other | Admitting: Family Medicine

## 2019-04-15 DIAGNOSIS — I5023 Acute on chronic systolic (congestive) heart failure: Secondary | ICD-10-CM | POA: Diagnosis not present

## 2019-04-15 DIAGNOSIS — J449 Chronic obstructive pulmonary disease, unspecified: Secondary | ICD-10-CM

## 2019-04-15 DIAGNOSIS — E782 Mixed hyperlipidemia: Secondary | ICD-10-CM

## 2019-04-15 DIAGNOSIS — F419 Anxiety disorder, unspecified: Secondary | ICD-10-CM

## 2019-04-15 DIAGNOSIS — I25118 Atherosclerotic heart disease of native coronary artery with other forms of angina pectoris: Secondary | ICD-10-CM | POA: Diagnosis not present

## 2019-04-15 DIAGNOSIS — F17200 Nicotine dependence, unspecified, uncomplicated: Secondary | ICD-10-CM

## 2019-04-15 MED ORDER — ALPRAZOLAM 0.5 MG PO TABS: 0.5000 mg | ORAL_TABLET | Freq: Three times a day (TID) | ORAL | 2 refills | Status: DC | PRN

## 2019-04-15 NOTE — Progress Notes (Signed)
Patient ID: Ian Mills, male   DOB: 1955-12-16, 63 y.o.   MRN: [id number]    Virtual Visit via video/phone Note  This visit type was conducted due to national recommendations for restrictions regarding the COVID-19 pandemic (e.g. social distancing).  This format is felt to be most appropriate for this patient at this time.  All issues noted in this document were discussed and addressed.  No physical exam was performed (except for noted visual exam findings with Video Visits).   I connected with Ian Mills today at  1:20 PM EDT by a video enabled telemedicine application or telephone and verified that I am speaking with the correct person using two identifiers. Location patient: home Location provider: work or home office Persons participating in the virtual visit: patient, provider  I discussed the limitations, risks, security and privacy concerns of performing an evaluation and management service by telephone and the availability of in person appointments. I also discussed with the patient that there may be a patient responsible charge related to this service. The patient expressed understanding and agreed to proceed.  Video connection attempted x3, able to see each other but audio not working. Finished visit over phone.  HPI:  Patient and I connected via video, or unsuccessful to hear each other so switched over to telephone to complete visit.  We are following up on his coronary artery disease, COPD, lipids, smoking and anxiety.  Patient has not checked BP recently, but states he is having no issues with chest pain, shortness of breath, wheezing or lower extremity swelling.  He did have defibrillator checked recently, defib is working well. Follows regularly with cardiology.  He is taking his inhalers as prescribed.  Has not needed to use a nebulizer in the past few months.  Tolerating statin without any adverse effects.  Denies any joint pains or abdominal pains.  Was almost  completely quit smoking with taking [name], but states his insurance no longer is covering the medicine.  He really would like to quit smoking due to his multiple medical issues.  Uses Xanax as needed for anxiety.  Denies SI or HI.  Has good support from significant other with whom he lives.  She is very in tune to his care and is helpful to him with his medications.   ROS:  See pertinent positives and negatives per HPI.  Past Medical History:  Diagnosis Date  . Coronary artery disease    a. 3 stents to the LAD, 1 to the PDA, and 1 to the OM3; b. cath 06/2013: chronically occluded LAD, patent stent LAD, D1 20%, pLCx 30%, mLCx 20%, pRCA 70% (FFR 0.74) s/p PCI/DES, mRCA 70% s/p PCI/DES, RPDA 50%, EF 20% w/ AK of anterolateral & apical walls, moderately elevated LVEDP  . HLD (hyperlipidemia)   . HTN (hypertension)   . ICD  single,BSX    a. DOI 11/2011; b. S/N# [id number]  . Ischemic cardiomyopathy    a. echo: 05/2014: EF 25-30%, AK of mid-dist ant, apical and dist inf walls, mod LVH, DD, mod dilated LA, mildly dilated RA, mild MR, mild Ao scl w/o stenosis    . Syncope and collapse   . Ventricular tachycardia ([place]) 11/2011   a. s/p AICD implant     Past Surgical History:  Procedure Laterality Date  . appendectomy    . CARDIAC CATHETERIZATION  Nov 30 2011   [facility]  . CARDIAC DEFIBRILLATOR PLACEMENT  May 2013  . CORONARY ANGIOPLASTY  2014   x 2 stents  .  CORONARY STENT PLACEMENT     x7  . PROSTATE SURGERY    . TUMOR REMOVAL     chest    Family History  Problem Relation Age of Onset  . Cancer Father        larynx  . Cancer - Lung Mother   . Hypertension Mother   . Heart attack Mother   . Dementia Maternal Grandmother   . Heart attack Paternal Grandfather    Social History   Tobacco Use  . Smoking status: Current Every Day Smoker    Packs/day: 1.00    Years: 42.00    Pack years: 42.00    Types: Cigarettes    Last attempt to quit: 09/03/2018    Years since quitting: 0.6  .  Smokeless tobacco: Never Used  Substance Use Topics  . Alcohol use: Yes    Alcohol/week: 1.0 standard drinks    Types: 1 Cans of beer per week    Frequency: Never    Comment: per week    Current Outpatient Medications:  .  ALPRAZolam (XANAX) 0.5 MG tablet, Take 1 tablet (0.5 mg total) by mouth 3 (three) times daily as needed. for anxiety, Disp: 60 tablet, Rfl: 2 .  amiodarone (PACERONE) 200 MG tablet, Take 1.5 tablets (300 mg total) by mouth daily., Disp: 135 tablet, Rfl: 3 .  aspirin 81 MG EC tablet, Take 1 tablet (81 mg total) by mouth daily., Disp: 30 tablet, Rfl: 6 .  atorvastatin (LIPITOR) 40 MG tablet, Take 1 tablet (40 mg total) by mouth daily., Disp: 90 tablet, Rfl: 3 .  carvedilol (COREG) 3.125 MG tablet, Take 1 tablet (3.125 mg total) by mouth 2 (two) times daily., Disp: 180 tablet, Rfl: 3 .  clopidogrel (PLAVIX) 75 MG tablet, Take 1 tablet (75 mg total) by mouth daily., Disp: 90 tablet, Rfl: 3 .  ezetimibe (ZETIA) 10 MG tablet, Take 1 tablet (10 mg total) by mouth daily., Disp: 90 tablet, Rfl: 3 .  furosemide (LASIX) 40 MG tablet, Take 1 tablet (40 mg total) by mouth 2 (two) times daily., Disp: 180 tablet, Rfl: 3 .  ipratropium-albuterol (DUONEB) 0.5-2.5 (3) MG/3ML SOLN, Take 3 mLs by nebulization every 6 (six) hours as needed., Disp: 360 mL, Rfl: 0 .  mometasone-formoterol (DULERA) 100-5 MCG/ACT AERO, Inhale 2 puffs into the lungs 2 (two) times daily., Disp: 1 Inhaler, Rfl: 1 .  mometasone-formoterol (DULERA) 200-5 MCG/ACT AERO, Inhale 2 puffs into the lungs 2 (two) times daily., Disp: 8.8 g, Rfl: 0 .  potassium chloride (KLOR-CON 10) 10 MEQ tablet, Take 1 tablet (10 mEq total) by mouth daily., Disp: 90 tablet, Rfl: 3 .  sacubitril-valsartan (ENTRESTO) 97-103 MG, Take 1 tablet by mouth 2 (two) times daily., Disp: 180 tablet, Rfl: 3 .  spironolactone (ALDACTONE) 25 MG tablet, Take 1 tablet (25 mg total) by mouth daily., Disp: 90 tablet, Rfl: 3 .  tadalafil (CIALIS) 10 MG tablet,  Take 1 tablet (10 mg total) by mouth daily as needed for erectile dysfunction (take 30 min prior to when needed.)., Disp: 10 tablet, Rfl: 3 .  varenicline ([name] STARTING MONTH PAK) 0.5 MG X 11 & 1 MG X 42 tablet, Take one 0.5 mg tablet by mouth once daily for 3 days, then increase to one 0.5 mg tablet twice daily for 4 days, then increase to one 1 mg tablet twice daily. (Patient not taking: Reported on 04/13/2019), Disp: 53 tablet, Rfl: 0  EXAM:  GENERAL: alert, oriented, appears well and in no acute  distress. Sounds well over phone.   HEENT: atraumatic, conjunttiva clear, no obvious abnormalities on inspection of external nose and ears  NECK: normal movements of the head and neck  LUNGS: Speaking in full sentences.  no signs of respiratory distress, breathing rate appears normal, no obvious gross SOB, gasping or wheezing  PSYCH/NEURO: pleasant and cooperative, no obvious depression or anxiety, speech and thought processing grossly intact  ASSESSMENT AND PLAN:  Discussed the following assessment and plan:  1. Coronary artery disease of native artery of native heart with stable angina pectoris (HCC) Stable on current medications. Follows regularly with cardiology.   2. Chronic obstructive pulmonary disease, unspecified COPD type (HCC) Continue Dulera, neb PRN, albuterol inhaler PRN  3. Smoker Smoking cessation discussed and highly recommended. He is agreeable to CCM referral  - Ambulatory referral to Chronic Care Management Services  4. Acute on chronic systolic CHF (congestive heart failure) (HCC) Stable on current medications. Follows regularly with cardiology.   5. Anxiety [place] PMP registry checked and is appropriate for this refill.  - ALPRAZolam (XANAX) 0.5 MG tablet; Take 1 tablet (0.5 mg total) by mouth 3 (three) times daily as needed. for anxiety  Dispense: 60 tablet; Refill: 2  6. Mixed hyperlipidemia Stable on current statin    I discussed the assessment and  treatment plan with the patient. The patient was provided an opportunity to ask questions and all were answered. The patient agreed with the plan and demonstrated an understanding of the instructions.   The patient was advised to call back or seek an in-person evaluation if the symptoms worsen or if the condition fails to improve as anticipated.  I provided 22 minutes of non-face-to-face time during this encounter.   Jodelle Green, FNP

## 2019-04-19 ENCOUNTER — Telehealth: Payer: Self-pay

## 2019-04-19 MED ORDER — ENTRESTO 97-103 MG PO TABS: 1.0000 | ORAL_TABLET | Freq: Two times a day (BID) | ORAL | 3 refills | Status: DC

## 2019-04-19 NOTE — Telephone Encounter (Signed)
Rx clarified and sent to [facility].

## 2019-04-22 DIAGNOSIS — Z23 Encounter for immunization: Secondary | ICD-10-CM | POA: Diagnosis not present

## 2019-04-22 DIAGNOSIS — T24231A Burn of second degree of right lower leg, initial encounter: Secondary | ICD-10-CM | POA: Diagnosis not present

## 2019-04-26 ENCOUNTER — Encounter: Payer: Self-pay | Admitting: Family Medicine

## 2019-04-26 ENCOUNTER — Other Ambulatory Visit: Payer: Self-pay

## 2019-04-26 ENCOUNTER — Telehealth: Payer: Self-pay

## 2019-04-26 ENCOUNTER — Ambulatory Visit (INDEPENDENT_AMBULATORY_CARE_PROVIDER_SITE_OTHER): Payer: Medicare Other | Admitting: Family Medicine

## 2019-04-26 VITALS — BP 142/80 | HR 79 | Temp 96.6°F | Resp 20 | Ht 69.0 in | Wt 197.2 lb

## 2019-04-26 DIAGNOSIS — T24201S Burn of second degree of unspecified site of right lower limb, except ankle and foot, sequela: Secondary | ICD-10-CM

## 2019-04-26 MED ORDER — SILVER SULFADIAZINE 1 % EX CREA: 1.0000 "application " | TOPICAL_CREAM | Freq: Every day | CUTANEOUS | 1 refills | Status: DC

## 2019-04-26 MED ORDER — OXYCODONE HCL 5 MG PO TABS: 5.0000 mg | ORAL_TABLET | Freq: Three times a day (TID) | ORAL | 0 refills | Status: DC | PRN

## 2019-04-26 NOTE — Telephone Encounter (Signed)
PEC stated it was in Care everywhere.
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
## 2019-04-26 NOTE — Telephone Encounter (Signed)
Send request over now.

## 2019-04-26 NOTE — Telephone Encounter (Signed)
I cant see that ER note  Anyway we can request before his visit today?

## 2019-04-29 DIAGNOSIS — I509 Heart failure, unspecified: Secondary | ICD-10-CM | POA: Diagnosis not present

## 2019-04-29 DIAGNOSIS — T25211A Burn of second degree of right ankle, initial encounter: Secondary | ICD-10-CM | POA: Diagnosis not present

## 2019-04-29 DIAGNOSIS — T24221A Burn of second degree of right knee, initial encounter: Secondary | ICD-10-CM | POA: Diagnosis not present

## 2019-04-29 DIAGNOSIS — I252 Old myocardial infarction: Secondary | ICD-10-CM | POA: Diagnosis not present

## 2019-04-29 DIAGNOSIS — I110 Hypertensive heart disease with heart failure: Secondary | ICD-10-CM | POA: Diagnosis not present

## 2019-05-03 ENCOUNTER — Ambulatory Visit (INDEPENDENT_AMBULATORY_CARE_PROVIDER_SITE_OTHER): Payer: Medicare Other | Admitting: Pharmacist

## 2019-05-03 DIAGNOSIS — J449 Chronic obstructive pulmonary disease, unspecified: Secondary | ICD-10-CM | POA: Diagnosis not present

## 2019-05-03 DIAGNOSIS — I25118 Atherosclerotic heart disease of native coronary artery with other forms of angina pectoris: Secondary | ICD-10-CM | POA: Diagnosis not present

## 2019-05-03 DIAGNOSIS — F17200 Nicotine dependence, unspecified, uncomplicated: Secondary | ICD-10-CM

## 2019-05-03 NOTE — Chronic Care Management (AMB) (Signed)
Chronic Care Management   Note  05/03/2019 Name: Ian Mills MRN: [id number] DOB: [birth year]   Subjective:  Ian Mills is a 63 y.o. year old male who is a primary care patient of Ian Mills, Ian Cree, FNP. The CCM team was consulted for assistance with chronic disease management and care coordination needs.     Ian Mills was given information about Chronic Care Management services today including:  1. CCM service includes personalized support from designated clinical staff supervised by his physician, including individualized plan of care and coordination with other care providers 2. 24/7 contact phone numbers for assistance for urgent and routine care needs. 3. Service will only be billed when office clinical staff spend 20 minutes or more in a month to coordinate care. 4. Only one practitioner may furnish and bill the service in a calendar month. 5. The patient may stop CCM services at any time (effective at the end of the month) by phone call to the office staff. 6. The patient will be responsible for cost sharing (co-pay) of up to 20% of the service fee (after annual deductible is met).  Patient agreed to services and verbal consent obtained.   Review of patient status, including review of consultants reports, laboratory and other test data, was performed as part of comprehensive evaluation and provision of chronic care management services.   Objective:  Lab Results  Component Value Date   CREATININE 1.42 (H) 09/12/2018   CREATININE 1.45 (H) 09/11/2018   CREATININE 1.45 (H) 09/09/2018        Component Value Date/Time   CHOL 170 12/09/2016 1103   CHOL 111 01/20/2014 0929   TRIG 113 12/09/2016 1103   TRIG 88 01/20/2014 0929   HDL 53 12/09/2016 1103   HDL 39 (L) 01/20/2014 0929   CHOLHDL 3.2 12/09/2016 1103   VLDL 18 01/20/2014 0929   LDLCALC 94 12/09/2016 1103   LDLCALC 54 01/20/2014 0929    Clinical ASCVD: Yes  The ASCVD Risk score (Goff DC Jr., et al., 2013)  failed to calculate for the following reasons:   The patient has a prior MI or stroke diagnosis    BP Readings from Last 3 Encounters:  04/26/19 (!) 142/80  01/04/19 124/80  09/17/18 120/64    Allergies  Allergen Reactions  . Nitroglycerin Nausea Only and Other (See Comments)    Nauseas Nauseas Per patient "causes server  headache"    Medications Reviewed Today    Reviewed by De Hollingshead, [facility] (Pharmacist) on 05/03/19 at 1516  Med List Status: <None>  Medication Order Taking? Sig Documenting Provider Last Dose Status Informant  ALPRAZolam (XANAX) 0.5 MG tablet [id number] Yes Take 1 tablet (0.5 mg total) by mouth 3 (three) times daily as needed. for anxiety Ian Mills, Ian Cree, FNP Taking Active            Med Note Mayo Ao May 03, 2019  3:15 PM) Takes ~ 2 tab/day   amiodarone (PACERONE) 200 MG tablet [id number] Yes Take 1.5 tablets (300 mg total) by mouth daily. Minna Merritts, MD Taking Active   aspirin 81 MG EC tablet [id number] Yes Take 1 tablet (81 mg total) by mouth daily. Minna Merritts, MD Taking Active Self  atorvastatin (LIPITOR) 40 MG tablet [id number] Yes Take 1 tablet (40 mg total) by mouth daily. Minna Merritts, MD Taking Active   carvedilol (COREG) 3.125 MG tablet [id number] Yes Take 1 tablet (3.125 mg total) by mouth 2 (two) times  daily. Minna Merritts, MD Taking Active   clopidogrel (PLAVIX) 75 MG tablet [id number] Yes Take 1 tablet (75 mg total) by mouth daily. Minna Merritts, MD Taking Active   ezetimibe (ZETIA) 10 MG tablet [id number] Yes Take 1 tablet (10 mg total) by mouth daily. Minna Merritts, MD Taking Active   furosemide (LASIX) 40 MG tablet [id number] Yes Take 1 tablet (40 mg total) by mouth 2 (two) times daily. Minna Merritts, MD Taking Active            Med Note Mayo Ao May 03, 2019  3:14 PM) Takes 80 mg at once   ipratropium-albuterol (DUONEB) 0.5-2.5 (3) MG/3ML SOLN [id number] No Take 3 mLs by nebulization  every 6 (six) hours as needed.  Patient not taking: Reported on 05/03/2019   Fritzi Mandes, MD Not Taking Active   mometasone-formoterol [facility]) 200-5 MCG/ACT Hollie Salk [id number] Yes Inhale 2 puffs into the lungs 2 (two) times daily. Laverle Hobby, MD Taking Active   oxyCODONE (ROXICODONE) 5 MG immediate release tablet [id number] No Take 1 tablet (5 mg total) by mouth every 8 (eight) hours as needed for severe pain.  Patient not taking: Reported on 05/03/2019   Jodelle Green, FNP Not Taking Active   potassium chloride (KLOR-CON 10) 10 MEQ tablet [id number] Yes Take 1 tablet (10 mEq total) by mouth daily. Minna Merritts, MD Taking Active   sacubitril-valsartan [facility]) 97-103 MG [id number] Yes Take 1 tablet by mouth 2 (two) times daily. Minna Merritts, MD Taking Active         Discontinued 05/03/19 1516 (Completed Course)   spironolactone (ALDACTONE) 25 MG tablet [id number] Yes Take 1 tablet (25 mg total) by mouth daily. Minna Merritts, MD Taking Active   tadalafil (CIALIS) 10 MG tablet [id number] Yes Take 1 tablet (10 mg total) by mouth daily as needed for erectile dysfunction (take 30 min prior to when needed.). Minna Merritts, MD Taking Active Self           Assessment:   Goals Addressed            This Visit's Progress     Patient Stated   . "I want to quit smoking" (pt-stated)       Current Barriers:  . Tobacco abuse of ~40 years; currently smoking 1 ppd . Previous quit attempts, successful ~ 1 year ago x 3 weeks; was previously able to quit smoking x 5 years; Using [name] w/ success before; has used patches, gm, lozenges  . Reports smoking within 30 minutes of waking up . Reports triggers to smoke include: unable to identify, just is a habit to him . Reports motivation to quit smoking includes: family  . On a scale of 1-10, reports MOTIVATION to quit is 10 . On a scale of 1-10, reports CONFIDENCE in quitting is 7 . Of note, patient has not had lipids checked since  2018 . He notes that he currently receives [place] from [employer] patient assistance and [facility] from [employer] patient assistance. He does not have an albuterol rescue inhaler, but denies having breakthrough SOB during the day. Has not needed nebulizer since receiving Dulera.   Pharmacist Clinical Goal(s):  Marland Kitchen Over the next 90 days, patient will work with PharmD and provider towards tobacco cessation  Interventions: . Comprehensive medication review performed, medication list in electronic medical record updated . Recommend combination of [name] + NRT patches, as the combination has greater superiority over [name] alone. Patient verbalized  understanding . Will collaborate with Philis Nettle, NP to create a provider account for [place] patient assistance. Once this step, and completion of provider portion of application occurs, will send link to patient at his wife's email - [email or web address] . Counseled on patch placement, side effects, and option to remove at night if they experience trouble sleeping or bad dreams. Counseled on GI side effects of [name], recommended he take with food.  . To receive patches for free, recommended he contact [place] (1-800-QUIT-NOW). Patient will outreach this group to receive patches . Will collaborate w/ Philis Nettle, NP for needed labwork moving forward.   Patient Self Care Activities:  . Patient will commit to reducing tobacco consumption  Initial goal documentation        Plan: - Will collaborate with patient and provider for [employer] assistance as above  Catie Darnelle Maffucci, PharmD, [place] Pharmacist [employer] [phone]

## 2019-05-03 NOTE — Patient Instructions (Addendum)
[name],   It was great talking with you today!  We talked about a few things:   1) You currently receive Delene Loll and Dulera through patient assistance. I will plan to help coordinate re-enrollment for 2021 for these.   2) The combination of [name] + nicotine patches has been shown to be more effective than [name] alone. We will work on getting [name] through [employer] patient assistance, but you can contact the [place] to receive nicotine patches (1-800-QUIT-NOW). This is the most cost-effective way to get you a supply of nicotine patches. I will work with Philis Nettle on the online provider portion of the [place] application, and will send the link to your wife's email address once done, and we'll work on getting the [name] for free.  Please feel free to call with any questions or concerns!  Catie Darnelle Maffucci, PharmD [phone] Visit Information  Goals Addressed            This Visit's Progress     Patient Stated   . "I want to quit smoking" (pt-stated)       Current Barriers:  . Tobacco abuse of ~40 years; currently smoking 1 ppd . Previous quit attempts, successful ~ 1 year ago x 3 weeks; was previously able to quit smoking x 5 years; Using [name] w/ success before; has used patches, gm, lozenges  . Reports smoking within 30 minutes of waking up . Reports triggers to smoke include: unable to identify, just is a habit to him . Reports motivation to quit smoking includes: family  . On a scale of 1-10, reports MOTIVATION to quit is 10 . On a scale of 1-10, reports CONFIDENCE in quitting is 7 . Of note, patient has not had lipids checked since 2018 . He notes that he currently receives [place] from [employer] patient assistance and [facility] from [employer] patient assistance. He does not have an albuterol rescue inhaler, but denies having breakthrough SOB during the day. Has not needed nebulizer since receiving Dulera.   Pharmacist Clinical Goal(s):  Marland Kitchen Over the next 90 days,  patient will work with PharmD and provider towards tobacco cessation  Interventions: . Comprehensive medication review performed, medication list in electronic medical record updated . Recommend combination of [name] + NRT patches, as the combination has greater superiority over [name] alone. Patient verbalized understanding . Will collaborate with Philis Nettle, NP to create a provider account for [place] patient assistance. Once this step, and completion of provider portion of application occurs, will send link to patient at his wife's email - [email or web address] . Counseled on patch placement, side effects, and option to remove at night if they experience trouble sleeping or bad dreams. Counseled on GI side effects of [name], recommended he take with food.  . To receive patches for free, recommended he contact [place] (1-800-QUIT-NOW). Patient will outreach this group to receive patches . Will collaborate w/ Philis Nettle, NP for needed labwork moving forward.   Patient Self Care Activities:  . Patient will commit to reducing tobacco consumption  Initial goal documentation        Mr. Beauchaine was given information about Chronic Care Management services today including:  1. CCM service includes personalized support from designated clinical staff supervised by his physician, including individualized plan of care and coordination with other care providers 2. 24/7 contact phone numbers for assistance for urgent and routine care needs. 3. Service will only be billed when office clinical staff spend 20 minutes or more in a  month to coordinate care. 4. Only one practitioner may furnish and bill the service in a calendar month. 5. The patient may stop CCM services at any time (effective at the end of the month) by phone call to the office staff. 6. The patient will be responsible for cost sharing (co-pay) of up to 20% of the service fee (after annual deductible is met).  Patient agreed to  services and verbal consent obtained.   The patient verbalized understanding of instructions provided today and declined a print copy of patient instruction materials.    Plan: - Will collaborate with patient and provider for [employer] assistance as above  [employer], PharmD, [place] Pharmacist [employer] [facility] [phone]

## 2019-05-04 DIAGNOSIS — T25211A Burn of second degree of right ankle, initial encounter: Secondary | ICD-10-CM | POA: Diagnosis not present

## 2019-05-04 DIAGNOSIS — T24221A Burn of second degree of right knee, initial encounter: Secondary | ICD-10-CM | POA: Diagnosis not present

## 2019-05-06 ENCOUNTER — Telehealth: Payer: Self-pay

## 2019-05-06 NOTE — Telephone Encounter (Signed)
Copied from [place] [phone]. Topic: General - Other >> May 06, 2019 10:21 AM Carolyn Stare wrote: Margot Chimes with [facility] at [place] has been trying to reach the pt and has not been able to

## 2019-05-06 NOTE — Telephone Encounter (Signed)
Margot Chimes with [facility] at [place] has been trying to reach the pt and has not been able to

## 2019-05-06 NOTE — Telephone Encounter (Signed)
Noted. Will defer to PCP upon her return.

## 2019-05-10 NOTE — Telephone Encounter (Signed)
Great news!  Thank you!  LG
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
## 2019-05-10 NOTE — Telephone Encounter (Signed)
Called Pt and he stated that he has an appt today at the [facility] wound center, and they have been great. Pt stated he is doing much better, his knee feels better, No more crutches or cane. He also stated to tell NP Philis Nettle Thanks a lot for everything she has done for him as well.

## 2019-05-27 ENCOUNTER — Telehealth: Payer: Medicare Other | Admitting: Pharmacist

## 2019-06-08 ENCOUNTER — Other Ambulatory Visit: Payer: Self-pay

## 2019-06-10 ENCOUNTER — Ambulatory Visit (INDEPENDENT_AMBULATORY_CARE_PROVIDER_SITE_OTHER): Payer: Medicare Other | Admitting: Internal Medicine

## 2019-06-10 ENCOUNTER — Encounter: Payer: Self-pay | Admitting: Internal Medicine

## 2019-06-10 ENCOUNTER — Other Ambulatory Visit: Payer: Self-pay

## 2019-06-10 VITALS — BP 130/62 | HR 68 | Temp 97.6°F | Ht 69.0 in | Wt 208.0 lb

## 2019-06-10 DIAGNOSIS — E559 Vitamin D deficiency, unspecified: Secondary | ICD-10-CM | POA: Diagnosis not present

## 2019-06-10 DIAGNOSIS — D649 Anemia, unspecified: Secondary | ICD-10-CM

## 2019-06-10 DIAGNOSIS — I6529 Occlusion and stenosis of unspecified carotid artery: Secondary | ICD-10-CM | POA: Insufficient documentation

## 2019-06-10 DIAGNOSIS — I5022 Chronic systolic (congestive) heart failure: Secondary | ICD-10-CM | POA: Insufficient documentation

## 2019-06-10 DIAGNOSIS — I2510 Atherosclerotic heart disease of native coronary artery without angina pectoris: Secondary | ICD-10-CM

## 2019-06-10 DIAGNOSIS — J449 Chronic obstructive pulmonary disease, unspecified: Secondary | ICD-10-CM | POA: Diagnosis not present

## 2019-06-10 DIAGNOSIS — M7120 Synovial cyst of popliteal space [Baker], unspecified knee: Secondary | ICD-10-CM | POA: Insufficient documentation

## 2019-06-10 DIAGNOSIS — Z23 Encounter for immunization: Secondary | ICD-10-CM | POA: Diagnosis not present

## 2019-06-10 DIAGNOSIS — H6123 Impacted cerumen, bilateral: Secondary | ICD-10-CM | POA: Insufficient documentation

## 2019-06-10 DIAGNOSIS — M419 Scoliosis, unspecified: Secondary | ICD-10-CM

## 2019-06-10 DIAGNOSIS — Z1329 Encounter for screening for other suspected endocrine disorder: Secondary | ICD-10-CM

## 2019-06-10 DIAGNOSIS — E785 Hyperlipidemia, unspecified: Secondary | ICD-10-CM

## 2019-06-10 DIAGNOSIS — I6523 Occlusion and stenosis of bilateral carotid arteries: Secondary | ICD-10-CM

## 2019-06-10 DIAGNOSIS — F419 Anxiety disorder, unspecified: Secondary | ICD-10-CM | POA: Diagnosis not present

## 2019-06-10 DIAGNOSIS — R739 Hyperglycemia, unspecified: Secondary | ICD-10-CM

## 2019-06-10 DIAGNOSIS — M7122 Synovial cyst of popliteal space [Baker], left knee: Secondary | ICD-10-CM

## 2019-06-10 HISTORY — DX: Impacted cerumen, bilateral: H61.23

## 2019-06-10 HISTORY — DX: Synovial cyst of popliteal space (Baker), unspecified knee: M71.20

## 2019-06-10 LAB — HEMOGLOBIN A1C: Hgb A1c MFr Bld: 5.7 % (ref 4.6–6.5)

## 2019-06-10 LAB — CBC WITH DIFFERENTIAL/PLATELET
Basophils Absolute: 0.1 10*3/uL (ref 0.0–0.1)
Basophils Relative: 1.1 % (ref 0.0–3.0)
Eosinophils Absolute: 0.1 10*3/uL (ref 0.0–0.7)
Eosinophils Relative: 1.4 % (ref 0.0–5.0)
HCT: 42.2 % (ref 39.0–52.0)
Hemoglobin: 13.8 g/dL (ref 13.0–17.0)
Lymphocytes Relative: 35.9 % (ref 12.0–46.0)
Lymphs Abs: 2.4 10*3/uL (ref 0.7–4.0)
MCHC: 32.8 g/dL (ref 30.0–36.0)
MCV: 88.7 fl (ref 78.0–100.0)
Monocytes Absolute: 0.7 10*3/uL (ref 0.1–1.0)
Monocytes Relative: 10.3 % (ref 3.0–12.0)
Neutro Abs: 3.5 10*3/uL (ref 1.4–7.7)
Neutrophils Relative %: 51.3 % (ref 43.0–77.0)
Platelets: 164 10*3/uL (ref 150.0–400.0)
RBC: 4.76 Mil/uL (ref 4.22–5.81)
RDW: 14.4 % (ref 11.5–15.5)
WBC: 6.8 10*3/uL (ref 4.0–10.5)

## 2019-06-10 LAB — COMPREHENSIVE METABOLIC PANEL
ALT: 29 U/L (ref 0–53)
AST: 22 U/L (ref 0–37)
Albumin: 4.1 g/dL (ref 3.5–5.2)
Alkaline Phosphatase: 117 U/L (ref 39–117)
BUN: 19 mg/dL (ref 6–23)
CO2: 26 mEq/L (ref 19–32)
Calcium: 8.8 mg/dL (ref 8.4–10.5)
Chloride: 104 mEq/L (ref 96–112)
Creatinine, Ser: 1.44 mg/dL (ref 0.40–1.50)
GFR: 49.11 mL/min — ABNORMAL LOW (ref >60.00)
Glucose, Bld: 99 mg/dL (ref 70–99)
Potassium: 3.8 mEq/L (ref 3.5–5.1)
Sodium: 138 mEq/L (ref 135–145)
Total Bilirubin: 0.4 mg/dL (ref 0.2–1.2)
Total Protein: 6.8 g/dL (ref 6.0–8.3)

## 2019-06-10 LAB — LIPID PANEL
Cholesterol: 142 mg/dL (ref 0–200)
HDL: 46.4 mg/dL (ref >39.00)
LDL Cholesterol: 69 mg/dL (ref 0–99)
NonHDL: 95.54
Total CHOL/HDL Ratio: 3
Triglycerides: 133 mg/dL (ref 0.0–149.0)
VLDL: 26.6 mg/dL (ref 0.0–40.0)

## 2019-06-10 LAB — VITAMIN D 25 HYDROXY (VIT D DEFICIENCY, FRACTURES): VITD: 31.05 ng/mL (ref 30.00–100.00)

## 2019-06-10 LAB — TSH: TSH: 1.94 u[IU]/mL (ref 0.35–4.50)

## 2019-06-10 MED ORDER — ALPRAZOLAM 0.5 MG PO TABS: 0.5000 mg | ORAL_TABLET | Freq: Three times a day (TID) | ORAL | 2 refills | Status: DC | PRN

## 2019-06-10 NOTE — Progress Notes (Signed)
No I did not give Flu shot I have not seen this patient. But a flu shot is logged.
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
## 2019-06-10 NOTE — Patient Instructions (Addendum)
Debrox ear wax drops for ear wax   [facility] Dr. Tami Ribas or Dr. Pryor Ochoa  Influenza (Flu) Vaccine (Inactivated or Recombinant): What You Need to Know 1. Why get vaccinated? Influenza vaccine can prevent influenza (flu). Flu is a contagious disease that spreads around the [place] every year, usually between October and May. Anyone can get the flu, but it is more dangerous for some people. Infants and young children, people 65 years of age and older, pregnant women, and people with certain health conditions or a weakened immune system are at greatest risk of flu complications. Pneumonia, bronchitis, sinus infections and ear infections are examples of flu-related complications. If you have a medical condition, such as heart disease, cancer or diabetes, flu can make it worse. Flu can cause fever and chills, sore throat, muscle aches, fatigue, cough, headache, and runny or stuffy nose. Some people may have vomiting and diarrhea, though this is more common in children than adults. Each year thousands of people in the [place] die from flu, and many more are hospitalized. Flu vaccine prevents millions of illnesses and flu-related visits to the doctor each year. 2. Influenza vaccine CDC recommends everyone 84 months of age and older get vaccinated every flu season. Children 6 months through 31 years of age may need 2 doses during a single flu season. Everyone else needs only 1 dose each flu season. It takes about 2 weeks for protection to develop after vaccination. There are many flu viruses, and they are always changing. Each year a new flu vaccine is made to protect against three or four viruses that are likely to cause disease in the upcoming flu season. Even when the vaccine doesn't exactly match these viruses, it may still provide some protection. Influenza vaccine does not cause flu. Influenza vaccine may be given at the same time as other vaccines. 3. Talk with your health care  provider Tell your vaccine provider if the person getting the vaccine:  Has had an allergic reaction after a previous dose of influenza vaccine, or has any severe, life-threatening allergies.  Has ever had Guillain-Barr Syndrome (also called GBS). In some cases, your health care provider may decide to postpone influenza vaccination to a future visit. People with minor illnesses, such as a cold, may be vaccinated. People who are moderately or severely ill should usually wait until they recover before getting influenza vaccine. Your health care provider can give you more information. 4. Risks of a vaccine reaction  Soreness, redness, and swelling where shot is given, fever, muscle aches, and headache can happen after influenza vaccine.  There may be a very small increased risk of Guillain-Barr Syndrome (GBS) after inactivated influenza vaccine (the flu shot). Young children who get the flu shot along with pneumococcal vaccine (PCV13), and/or DTaP vaccine at the same time might be slightly more likely to have a seizure caused by fever. Tell your health care provider if a child who is getting flu vaccine has ever had a seizure. People sometimes faint after medical procedures, including vaccination. Tell your provider if you feel dizzy or have vision changes or ringing in the ears. As with any medicine, there is a very remote chance of a vaccine causing a severe allergic reaction, other serious injury, or death. 5. What if there is a serious problem? An allergic reaction could occur after the vaccinated person leaves the clinic. If you see signs of a severe allergic reaction (hives, swelling of the face and throat, difficulty breathing, a fast heartbeat,  dizziness, or weakness), call 9-1-1 and get the person to the nearest hospital. For other signs that concern you, call your health care provider. Adverse reactions should be reported to the Vaccine Adverse Event Reporting System (VAERS). Your health  care provider will usually file this report, or you can do it yourself. Visit the VAERS website at www.vaers.SamedayNews.es or call 3863267609.VAERS is only for reporting reactions, and VAERS staff do not give medical advice. 6. The National Vaccine Injury Compensation Program The [employer] Vaccine Injury Compensation Program (VICP) is a federal program that was created to compensate people who may have been injured by certain vaccines. Visit the VICP website at GoldCloset.com.ee or call 985-761-5011 to learn about the program and about filing a claim. There is a time limit to file a claim for compensation. 7. How can I learn more?  Ask your healthcare provider.  Call your local or state health department.  Contact the Centers for Disease Control and Prevention (CDC): ? Call 337-647-8649 (1-800-CDC-INFO) or ? Visit CDC's https://gibson.com/ Vaccine Information Statement (Interim) Inactivated Influenza Vaccine (03/12/2018) This information is not intended to replace advice given to you by your health care provider. Make sure you discuss any questions you have with your health care provider. Document Released: 05/09/2006 Document Revised: 11/03/2018 Document Reviewed: 03/16/2018 Elsevier Patient Education  2020 [employer].

## 2019-06-11 LAB — IRON,TIBC AND FERRITIN PANEL
%SAT: 16 % (calc) — ABNORMAL LOW (ref 20–48)
Ferritin: 23 ng/mL — ABNORMAL LOW (ref 24–380)
Iron: 65 ug/dL (ref 50–180)
TIBC: 418 mcg/dL (calc) (ref 250–425)

## 2019-06-14 ENCOUNTER — Telehealth: Payer: Self-pay | Admitting: Cardiovascular Disease

## 2019-06-14 ENCOUNTER — Telehealth: Payer: Self-pay | Admitting: *Deleted

## 2019-06-14 NOTE — Telephone Encounter (Signed)
Copied from Alex [id number]. Topic: General - Other >> Jun 14, 2019 12:06 PM Ivar Drape wrote: Reason for CRM:   Patient would like the results of his recent labs

## 2019-06-14 NOTE — Telephone Encounter (Signed)
Virtual Visit Pre-Appointment Phone Call  "(Name), I am calling you today to discuss your upcoming appointment. We are currently trying to limit exposure to the virus that causes COVID-19 by seeing patients at home rather than in the office."  1. "What is the BEST phone number to call the day of the visit?" - include this in appointment notes  2. Do you have or have access to (through a family member/friend) a smartphone with video capability that we can use for your visit?" a. If yes - list this number in appt notes as cell (if different from BEST phone #) and list the appointment type as a VIDEO visit in appointment notes b. If no - list the appointment type as a PHONE visit in appointment notes  3. Confirm consent - "In the setting of the current Covid19 crisis, you are scheduled for a (phone or video) visit with your provider on (date) at (time).  Just as we do with many in-office visits, in order for you to participate in this visit, we must obtain consent.  If you'd like, I can send this to your mychart (if signed up) or email for you to review.  Otherwise, I can obtain your verbal consent now.  All virtual visits are billed to your insurance company just like a normal visit would be.  By agreeing to a virtual visit, we'd like you to understand that the technology does not allow for your provider to perform an examination, and thus may limit your provider's ability to fully assess your condition. If your provider identifies any concerns that need to be evaluated in person, we will make arrangements to do so.  Finally, though the technology is pretty good, we cannot assure that it will always work on either your or our end, and in the setting of a video visit, we may have to convert it to a phone-only visit.  In either situation, we cannot ensure that we have a secure connection.  Are you willing to proceed?" STAFF: Did the patient verbally acknowledge consent to telehealth visit? Document  YES/NO here: yes  4. Advise patient to be prepared - "Two hours prior to your appointment, go ahead and check your blood pressure, pulse, oxygen saturation, and your weight (if you have the equipment to check those) and write them all down. When your visit starts, your provider will ask you for this information. If you have an Apple Watch or Kardia device, please plan to have heart rate information ready on the day of your appointment. Please have a pen and paper handy nearby the day of the visit as well."  5. Give patient instructions for MyChart download to smartphone OR [email or web address] as below if video visit (depending on what platform provider is using)  6. Inform patient they will receive a phone call 15 minutes prior to their appointment time (may be from unknown caller ID) so they should be prepared to answer    TELEPHONE CALL NOTE  Ian Mills has been deemed a candidate for a follow-up tele-health visit to limit community exposure during the Covid-19 pandemic. I spoke with the patient via phone to ensure availability of phone/video source, confirm preferred email & phone number, and discuss instructions and expectations.  I reminded Ian Mills to be prepared with any vital sign and/or heart rhythm information that could potentially be obtained via home monitoring, at the time of his visit. I reminded Ian Mills to expect a phone call prior to  his visit.  Clarisse Gouge 06/14/2019 12:02 PM   INSTRUCTIONS FOR DOWNLOADING THE MYCHART APP TO SMARTPHONE  - The patient must first make sure to have activated MyChart and know their login information - If Apple, go to [employer] and type in MyChart in the search bar and download the app. If Android, ask patient to go to [employer] and type in [facility] in the search bar and download the app. The app is free but as with any other app downloads, their phone may require them to verify saved payment information or Apple/Android  password.  - The patient will need to then log into the app with their MyChart username and password, and select [facility] as their healthcare provider to link the account. When it is time for your visit, go to the MyChart app, find appointments, and click Begin Video Visit. Be sure to Select Allow for your device to access the Microphone and Camera for your visit. You will then be connected, and your provider will be with you shortly.  **If they have any issues connecting, or need assistance please contact MyChart service desk [phone])**  **If using a computer, in order to ensure the best quality for their visit they will need to use either of the following Internet Browsers: [employer], or Google Chrome**  IF USING DOXIMITY or DOXY.ME - The patient will receive a link just prior to their visit by text.     FULL LENGTH CONSENT FOR TELE-HEALTH VISIT   I hereby voluntarily request, consent and authorize [facility] and its employed or contracted physicians, physician assistants, nurse practitioners or other licensed health care professionals (the Practitioner), to provide me with telemedicine health care services (the Services") as deemed necessary by the treating Practitioner. I acknowledge and consent to receive the Services by the Practitioner via telemedicine. I understand that the telemedicine visit will involve communicating with the Practitioner through live audiovisual communication technology and the disclosure of certain medical information by electronic transmission. I acknowledge that I have been given the opportunity to request an in-person assessment or other available alternative prior to the telemedicine visit and am voluntarily participating in the telemedicine visit.  I understand that I have the right to withhold or withdraw my consent to the use of telemedicine in the course of my care at any time, without affecting my right to future care or treatment,  and that the Practitioner or I may terminate the telemedicine visit at any time. I understand that I have the right to inspect all information obtained and/or recorded in the course of the telemedicine visit and may receive copies of available information for a reasonable fee.  I understand that some of the potential risks of receiving the Services via telemedicine include:   Delay or interruption in medical evaluation due to technological equipment failure or disruption;  Information transmitted may not be sufficient (e.g. poor resolution of images) to allow for appropriate medical decision making by the Practitioner; and/or   In rare instances, security protocols could fail, causing a breach of personal health information.  Furthermore, I acknowledge that it is my responsibility to provide information about my medical history, conditions and care that is complete and accurate to the best of my ability. I acknowledge that Practitioner's advice, recommendations, and/or decision may be based on factors not within their control, such as incomplete or inaccurate data provided by me or distortions of diagnostic images or specimens that may result from electronic transmissions. I  understand that the practice of medicine is not an exact science and that Practitioner makes no warranties or guarantees regarding treatment outcomes. I acknowledge that I will receive a copy of this consent concurrently upon execution via email to the email address I last provided but may also request a printed copy by calling the office of [facility].    I understand that my insurance will be billed for this visit.   I have read or had this consent read to me.  I understand the contents of this consent, which adequately explains the benefits and risks of the Services being provided via telemedicine.   I have been provided ample opportunity to ask questions regarding this consent and the Services and have had my questions  answered to my satisfaction.  I give my informed consent for the services to be provided through the use of telemedicine in my medical care  By participating in this telemedicine visit I agree to the above.

## 2019-06-15 ENCOUNTER — Ambulatory Visit (INDEPENDENT_AMBULATORY_CARE_PROVIDER_SITE_OTHER): Payer: Medicare Other | Admitting: *Deleted

## 2019-06-15 DIAGNOSIS — I5022 Chronic systolic (congestive) heart failure: Secondary | ICD-10-CM | POA: Diagnosis not present

## 2019-06-15 NOTE — Telephone Encounter (Signed)
Patient calling to check the status of getting a call back with his lab results. Please advise.

## 2019-06-16 LAB — CUP PACEART REMOTE DEVICE CHECK
Battery Remaining Longevity: 66 mo
Battery Remaining Percentage: 73 %
Brady Statistic RV Percent Paced: 0 %
Date Time Interrogation Session: 20201117155300
HighPow Impedance: 90 Ohm
Implantable Lead Implant Date: 20130503
Implantable Lead Location: 753860
Implantable Lead Model: 292
Implantable Lead Serial Number: 112568
Implantable Pulse Generator Implant Date: 20130503
Lead Channel Impedance Value: 610 Ohm
Lead Channel Pacing Threshold Amplitude: 0.8 V
Lead Channel Pacing Threshold Pulse Width: 0.5 ms
Lead Channel Setting Pacing Amplitude: 2.4 V
Lead Channel Setting Pacing Pulse Width: 0.5 ms
Lead Channel Setting Sensing Sensitivity: 0.6 mV
Pulse Gen Serial Number: 105232

## 2019-06-16 NOTE — Telephone Encounter (Signed)
Please see result note 

## 2019-06-17 LAB — CUP PACEART REMOTE DEVICE CHECK
Battery Remaining Longevity: 66 mo
Battery Remaining Percentage: 73 %
Brady Statistic RV Percent Paced: 0 %
Date Time Interrogation Session: 20201119022100
HighPow Impedance: 84 Ohm
Implantable Lead Implant Date: 20130503
Implantable Lead Location: 753860
Implantable Lead Model: 292
Implantable Lead Serial Number: 112568
Implantable Pulse Generator Implant Date: 20130503
Lead Channel Impedance Value: 612 Ohm
Lead Channel Pacing Threshold Amplitude: 0.8 V
Lead Channel Pacing Threshold Pulse Width: 0.5 ms
Lead Channel Setting Pacing Amplitude: 2.4 V
Lead Channel Setting Pacing Pulse Width: 0.5 ms
Lead Channel Setting Sensing Sensitivity: 0.6 mV
Pulse Gen Serial Number: 105232

## 2019-06-21 ENCOUNTER — Ambulatory Visit: Payer: Medicare Other | Admitting: Pharmacist

## 2019-06-21 DIAGNOSIS — F17200 Nicotine dependence, unspecified, uncomplicated: Secondary | ICD-10-CM

## 2019-06-21 NOTE — Patient Instructions (Signed)
Visit Information  Goals Addressed            This Visit's Progress     Patient Stated   . "I want to quit smoking" (pt-stated)       Current Barriers:  . Tobacco abuse of ~40 years; currently smoking 1 ppd . Previous quit attempts, successful ~ 1 year ago x 3 weeks; was previously able to quit smoking x 5 years; Using [name] w/ success before; has used patches, gm, lozenges  o Decided to apply for [name] patient assistance. Initially were going to use [employer] Portal, however, process is complicated. Will use paper form instead . Reports smoking within 30 minutes of waking up . Reports triggers to smoke include: unable to identify, just is a habit to him . Reports motivation to quit smoking includes: family  . On a scale of 1-10, reports MOTIVATION to quit is 10 . On a scale of 1-10, reports CONFIDENCE in quitting is 7 . He notes that he currently receives [place] from [employer] patient assistance and [facility] from [employer] patient assistance. He does not have an albuterol rescue inhaler, but denies having breakthrough SOB during the day. Has not needed nebulizer since receiving Dulera.   Pharmacist Clinical Goal(s):  Marland Kitchen Over the next 90 days, patient will work with PharmD and provider towards tobacco cessation  Interventions: . Contacted patient; discussed my recommendation to use Engineer, petroleum. Patient in agreement. Will bring copy of his income information by clinic, and will sign application. Will collaborate w/ Dr. Terese Door for her signature on provider portion of application  Patient Self Care Activities:  . Patient will commit to reducing tobacco consumption  Please see past updates related to this goal by clicking on the "Past Updates" button in the selected goal         The patient verbalized understanding of instructions provided today and declined a print copy of patient instruction materials.    Plan: - Will collaborate w/ patient and provider as  above. Once application has been received, will pass materials along to [employer], CPhT for follow up  Catie Darnelle Maffucci, PharmD, [place] Pharmacist [place] [phone]

## 2019-06-21 NOTE — Chronic Care Management (AMB) (Signed)
Chronic Care Management   Follow Up Note   06/21/2019 Name: Ian Mills MRN: [id number] DOB: 21-Aug-1955  Referred by: McLean-Scocuzza, Nino Glow, MD Reason for referral : Chronic Care Management (Medication Management)   Ian Mills is a 63 y.o. year old male who is a primary care patient of McLean-Scocuzza, Nino Glow, MD. The CCM team was consulted for assistance with chronic disease management and care coordination needs.    Contacted patient for medication management  Review of patient status, including review of consultants reports, relevant laboratory and other test results, and collaboration with appropriate care team members and the patient's provider was performed as part of comprehensive patient evaluation and provision of chronic care management services.    SDOH (Social Determinants of Health) screening performed today: Financial Strain  Tobacco Use. See Care Plan for related entries.   Outpatient Encounter Medications as of 06/21/2019  Medication Sig Note  . ALPRAZolam (XANAX) 0.5 MG tablet Take 1 tablet (0.5 mg total) by mouth 3 (three) times daily as needed. for anxiety   . amiodarone (PACERONE) 200 MG tablet Take 1.5 tablets (300 mg total) by mouth daily.   Marland Kitchen aspirin 81 MG EC tablet Take 1 tablet (81 mg total) by mouth daily.   Marland Kitchen atorvastatin (LIPITOR) 40 MG tablet Take 1 tablet (40 mg total) by mouth daily.   . carvedilol (COREG) 3.125 MG tablet Take 1 tablet (3.125 mg total) by mouth 2 (two) times daily.   . clopidogrel (PLAVIX) 75 MG tablet Take 1 tablet (75 mg total) by mouth daily.   Marland Kitchen ezetimibe (ZETIA) 10 MG tablet Take 1 tablet (10 mg total) by mouth daily.   . furosemide (LASIX) 40 MG tablet Take 1 tablet (40 mg total) by mouth 2 (two) times daily. (Patient taking differently: Take 80 mg by mouth daily. ) 05/03/2019: Takes 80 mg at once   . ipratropium-albuterol (DUONEB) 0.5-2.5 (3) MG/3ML SOLN Take 3 mLs by nebulization every 6 (six) hours as needed.   .  mometasone-formoterol (DULERA) 200-5 MCG/ACT AERO Inhale 2 puffs into the lungs 2 (two) times daily.   . potassium chloride (KLOR-CON 10) 10 MEQ tablet Take 1 tablet (10 mEq total) by mouth daily.   . sacubitril-valsartan (ENTRESTO) 97-103 MG Take 1 tablet by mouth 2 (two) times daily.   Marland Kitchen spironolactone (ALDACTONE) 25 MG tablet Take 1 tablet (25 mg total) by mouth daily.   . tadalafil (CIALIS) 10 MG tablet Take 1 tablet (10 mg total) by mouth daily as needed for erectile dysfunction (take 30 min prior to when needed.).    No facility-administered encounter medications on file as of 06/21/2019.      Goals Addressed            This Visit's Progress     Patient Stated   . "I want to quit smoking" (pt-stated)       Current Barriers:  . Tobacco abuse of ~40 years; currently smoking 1 ppd . Previous quit attempts, successful ~ 1 year ago x 3 weeks; was previously able to quit smoking x 5 years; Using [name] w/ success before; has used patches, gm, lozenges  o Decided to apply for [name] patient assistance. Initially were going to use [employer] Portal, however, process is complicated. Will use paper form instead . Reports smoking within 30 minutes of waking up . Reports triggers to smoke include: unable to identify, just is a habit to him . Reports motivation to quit smoking includes: family  . On a  scale of 1-10, reports MOTIVATION to quit is 10 . On a scale of 1-10, reports CONFIDENCE in quitting is 7 . He notes that he currently receives [place] from [employer] patient assistance and [facility] from [employer] patient assistance. He does not have an albuterol rescue inhaler, but denies having breakthrough SOB during the day. Has not needed nebulizer since receiving Dulera.   Pharmacist Clinical Goal(s):  Marland Kitchen Over the next 90 days, patient will work with PharmD and provider towards tobacco cessation  Interventions: . Contacted patient; discussed my recommendation to use Engineer, petroleum.  Patient in agreement. Will bring copy of his income information by clinic, and will sign application. Will collaborate w/ Dr. Terese Door for her signature on provider portion of application  Patient Self Care Activities:  . Patient will commit to reducing tobacco consumption  Please see past updates related to this goal by clicking on the "Past Updates" button in the selected goal          Plan: - Will collaborate w/ patient and provider as above. Once application has been received, will pass materials along to [employer], CPhT for follow up  Catie Darnelle Maffucci, PharmD, [place] Pharmacist [place] [phone]

## 2019-06-29 DIAGNOSIS — N3020 Other chronic cystitis without hematuria: Secondary | ICD-10-CM | POA: Diagnosis not present

## 2019-06-29 DIAGNOSIS — N538 Other male sexual dysfunction: Secondary | ICD-10-CM | POA: Diagnosis not present

## 2019-06-29 DIAGNOSIS — N318 Other neuromuscular dysfunction of bladder: Secondary | ICD-10-CM | POA: Diagnosis not present

## 2019-06-29 DIAGNOSIS — N401 Enlarged prostate with lower urinary tract symptoms: Secondary | ICD-10-CM | POA: Diagnosis not present

## 2019-06-30 ENCOUNTER — Telehealth: Payer: Medicare Other | Admitting: Cardiovascular Disease

## 2019-06-30 ENCOUNTER — Other Ambulatory Visit: Payer: Self-pay

## 2019-06-30 ENCOUNTER — Encounter: Payer: Self-pay | Admitting: Nurse Practitioner

## 2019-06-30 ENCOUNTER — Ambulatory Visit (INDEPENDENT_AMBULATORY_CARE_PROVIDER_SITE_OTHER): Payer: Medicare Other | Admitting: Nurse Practitioner

## 2019-06-30 VITALS — BP 120/70 | HR 61 | Temp 97.0°F | Ht 68.0 in | Wt 204.0 lb

## 2019-06-30 DIAGNOSIS — E785 Hyperlipidemia, unspecified: Secondary | ICD-10-CM | POA: Diagnosis not present

## 2019-06-30 DIAGNOSIS — H9193 Unspecified hearing loss, bilateral: Secondary | ICD-10-CM | POA: Diagnosis not present

## 2019-06-30 DIAGNOSIS — Z720 Tobacco use: Secondary | ICD-10-CM

## 2019-06-30 DIAGNOSIS — I10 Essential (primary) hypertension: Secondary | ICD-10-CM

## 2019-06-30 DIAGNOSIS — I472 Ventricular tachycardia, unspecified: Secondary | ICD-10-CM

## 2019-06-30 DIAGNOSIS — I25119 Atherosclerotic heart disease of native coronary artery with unspecified angina pectoris: Secondary | ICD-10-CM

## 2019-06-30 DIAGNOSIS — I5020 Unspecified systolic (congestive) heart failure: Secondary | ICD-10-CM

## 2019-06-30 DIAGNOSIS — I255 Ischemic cardiomyopathy: Secondary | ICD-10-CM | POA: Diagnosis not present

## 2019-06-30 DIAGNOSIS — R0789 Other chest pain: Secondary | ICD-10-CM

## 2019-06-30 DIAGNOSIS — N538 Other male sexual dysfunction: Secondary | ICD-10-CM | POA: Diagnosis not present

## 2019-06-30 DIAGNOSIS — N401 Enlarged prostate with lower urinary tract symptoms: Secondary | ICD-10-CM | POA: Diagnosis not present

## 2019-06-30 DIAGNOSIS — H6123 Impacted cerumen, bilateral: Secondary | ICD-10-CM | POA: Diagnosis not present

## 2019-06-30 NOTE — Patient Instructions (Signed)
Medication Instructions:  - Your physician recommends that you continue on your current medications as directed. Please refer to the Current Medication list given to you today.  *If you need a refill on your cardiac medications before your next appointment, please call your pharmacy*  Lab Work: - none ordered  If you have labs (blood work) drawn today and your tests are completely normal, you will receive your results only by: Marland Kitchen MyChart Message (if you have MyChart) OR . A paper copy in the mail If you have any lab test that is abnormal or we need to change your treatment, we will call you to review the results.  Testing/Procedures: - Your physician has requested that you have a lexiscan myoview.   [place]  Your caregiver has ordered a Stress Test with nuclear imaging. The purpose of this test is to evaluate the blood supply to your heart muscle. This procedure is referred to as a "Non-Invasive Stress Test." This is because other than having an IV started in your vein, nothing is inserted or "invades" your body. Cardiac stress tests are done to find areas of poor blood flow to the heart by determining the extent of coronary artery disease (CAD). Some patients exercise on a treadmill, which naturally increases the blood flow to your heart, while others who are  unable to walk on a treadmill due to physical limitations have a pharmacologic/chemical stress agent called Lexiscan . This medicine will mimic walking on a treadmill by temporarily increasing your coronary blood flow.   Please note: these test may take anywhere between 2-4 hours to complete  PLEASE REPORT TO [place] AT THE FIRST DESK WILL DIRECT YOU WHERE TO GO  Date of Procedure:_____________________________________  Arrival Time for Procedure:______________________________  Instructions regarding medication:   ____ : Hold diabetes medication morning of procedure  _x___:  Hold  betablocker(s) night before procedure and morning of procedure (carvedilol)  _x___:  Hold other medications as follow: lasix (furosemide) the morning of your procedure  PLEASE NOTIFY THE OFFICE AT LEAST 24 HOURS IN ADVANCE IF YOU ARE UNABLE TO KEEP YOUR APPOINTMENT.  [phone] AND  PLEASE NOTIFY NUCLEAR MEDICINE AT [facility] AT LEAST 24 HOURS IN ADVANCE IF YOU ARE UNABLE TO KEEP YOUR APPOINTMENT. [phone]  How to prepare for your Myoview test:  1. Do not eat or drink after midnight 2. No caffeine for 24 hours prior to test 3. No smoking 24 hours prior to test. 4. Your medication may be taken with water.  If your doctor stopped a medication because of this test, do not take that medication. 5. Ladies, please do not wear dresses.  Skirts or pants are appropriate. Please wear a short sleeve shirt. 6. No perfume, cologne or lotion. 7. Wear comfortable walking shoes. No heels!  Follow-Up: At [facility], you and your health needs are our priority.  As part of our continuing mission to provide you with exceptional heart care, we have created designated Provider Care Teams.  These Care Teams include your primary Cardiologist (physician) and Advanced Practice Providers (APPs -  Physician Assistants and Nurse Practitioners) who all work together to provide you with the care you need, when you need it.  Your next appointment:   1 month(s)  The format for your next appointment:   In Person  Provider:   Ida Rogue, MD  Other Instructions [address] [place], [place] [address]  [phone]    Cardiac Nuclear  Scan A cardiac nuclear scan is a test that measures blood flow to the heart when a person is resting and when he or she is exercising. The test looks for problems such as:  Not enough blood reaching a portion of the heart.  The heart muscle not working normally. You may need this test if:  You have heart disease.  You have had abnormal lab  results.  You have had heart surgery or a balloon procedure to open up blocked arteries (angioplasty).  You have chest pain.  You have shortness of breath. In this test, a radioactive dye (tracer) is injected into your bloodstream. After the tracer has traveled to your heart, an imaging device is used to measure how much of the tracer is absorbed by or distributed to various areas of your heart. This procedure is usually done at a hospital and takes 2-4 hours. Tell a health care provider about:  Any allergies you have.  All medicines you are taking, including vitamins, herbs, eye drops, creams, and over-the-counter medicines.  Any problems you or family members have had with anesthetic medicines.  Any blood disorders you have.  Any surgeries you have had.  Any medical conditions you have.  Whether you are pregnant or may be pregnant. What are the risks? Generally, this is a safe procedure. However, problems may occur, including:  Serious chest pain and heart attack. This is only a risk if the stress portion of the test is done.  Rapid heartbeat.  Sensation of warmth in your chest. This usually passes quickly.  Allergic reaction to the tracer. What happens before the procedure?  Ask your health care provider about changing or stopping your regular medicines. This is especially important if you are taking diabetes medicines or blood thinners.  Follow instructions from your health care provider about eating or drinking restrictions.  Remove your jewelry on the day of the procedure. What happens during the procedure?  An IV will be inserted into one of your veins.  Your health care provider will inject a small amount of radioactive tracer through the IV.  You will wait for 20-40 minutes while the tracer travels through your bloodstream.  Your heart activity will be monitored with an electrocardiogram (ECG).  You will lie down on an exam table.  Images of your heart  will be taken for about 15-20 minutes.  You may also have a stress test. For this test, one of the following may be done: ? You will exercise on a treadmill or stationary bike. While you exercise, your heart's activity will be monitored with an ECG, and your blood pressure will be checked. ? You will be given medicines that will increase blood flow to parts of your heart. This is done if you are unable to exercise.  When blood flow to your heart has peaked, a tracer will again be injected through the IV.  After 20-40 minutes, you will get back on the exam table and have more images taken of your heart.  Depending on the type of tracer used, scans may need to be repeated 3-4 hours later.  Your IV line will be removed when the procedure is over. The procedure may vary among health care providers and hospitals. What happens after the procedure?  Unless your health care provider tells you otherwise, you may return to your normal schedule, including diet, activities, and medicines.  Unless your health care provider tells you otherwise, you may increase your fluid intake. This will help to  flush the contrast dye from your body. Drink enough fluid to keep your urine pale yellow.  Ask your health care provider, or the department that is doing the test: ? When will my results be ready? ? How will I get my results? Summary  A cardiac nuclear scan measures the blood flow to the heart when a person is resting and when he or she is exercising.  Tell your health care provider if you are pregnant.  Before the procedure, ask your health care provider about changing or stopping your regular medicines. This is especially important if you are taking diabetes medicines or blood thinners.  After the procedure, unless your health care provider tells you otherwise, increase your fluid intake. This will help flush the contrast dye from your body.  After the procedure, unless your health care provider tells  you otherwise, you may return to your normal schedule, including diet, activities, and medicines. This information is not intended to replace advice given to you by your health care provider. Make sure you discuss any questions you have with your health care provider. Document Released: 08/09/2004 Document Revised: 12/29/2017 Document Reviewed: 12/29/2017 Elsevier Patient Education  2020 [employer].

## 2019-06-30 NOTE — Progress Notes (Signed)
Office Visit    Patient Name: Ian Mills Date of Encounter: 06/30/2019  Primary Care Provider:  McLean-Scocuzza, Nino Glow, MD Primary Cardiologist:  Ida Rogue, MD  Chief Complaint    63 year old male with a history of CAD status post prior LAD, PDA, OM 3, and RCA stenting, HFrEF/ischemic cardiomyopathy status post AICD placement (EF 25-30%), hypertension, hyperlipidemia, and ventricular tachycardia, who presents for follow-up related to chest tightness.  Past Medical History    Past Medical History:  Diagnosis Date  . Coronary artery disease    a. 3 stents to the LAD, 1 to the PDA, and 1 to the OM3; b. cath 06/2013: chronically occluded LAD, patent stent LAD, D1 20%, pLCx 30%, mLCx 20%, pRCA 70% (FFR 0.74) s/p PCI/DES, mRCA 70% s/p PCI/DES, RPDA 50%, EF 20% w/ AK of anterolateral & apical walls, moderately elevated LVEDP  . HFrEF (heart failure with reduced ejection fraction) ([place])    a. 05/2014 Echo: EF 25-30%; b. 01/2018 Echo ([facility]): EF 30%.  Marland Kitchen HLD (hyperlipidemia)   . HTN (hypertension)   . ICD  single,BSX    a. DOI 11/2011; b. S/N# [id number]  . Ischemic cardiomyopathy    a. 05/2014 Echo: EF 25-30%; b. 01/2018 Echo ([facility]): EF 30%, sev glob HK w/ apical/ant AK. Mild LVH. Mod LAE, mild RAE. Triv MR/TR.  Marland Kitchen Pneumonia    08/2018  . Syncope and collapse   . Ventricular tachycardia ([place]) 11/2011   a. s/p AICD implant    Past Surgical History:  Procedure Laterality Date  . appendectomy    . CARDIAC CATHETERIZATION  Nov 30 2011   [facility]  . CARDIAC DEFIBRILLATOR PLACEMENT  May 2013  . CORONARY ANGIOPLASTY  2014   x 2 stents  . CORONARY STENT PLACEMENT     x7  . PROSTATE SURGERY     urethra grew around prostate   . TUMOR REMOVAL  2001   chest thymic cyst; behind lungs [facility] benign     Allergies  Allergies  Allergen Reactions  . Nitroglycerin Nausea Only and Other (See Comments)    Nauseas Nauseas Per patient "causes server  headache"    History of Present Illness    63-year male with the above complex past medical history including coronary artery disease status post LAD, PDA, and OM 3 stenting in February 2008.  Subsequent catheterization in December 2014 revealed severe proximal and mid RCA stenosis requiring PCI.  Other history includes ischemic cardiomyopathy and HFrEF with an EF of 30% by echo in July 2019.  He is status post AICD placement in 2013 also carries a history of hypertension, hyperlipidemia, syncope, and ventricular tachycardia.    He was last seen via telemedicine visit by Dr. Rockey Situ in April and since then he has mostly done well.  He says that he was able to stay reasonably active over the summer without any symptoms or limitations.  Over the past 3 to 4 months however, he has noted episodic midsternal chest tightness and right shoulder discomfort about once every 2 weeks, rated at 5/10, without associated symptoms, lasting up to 2 days in a constant fashion, and resolving spontaneously.  He notes that prior anginal equivalent was chest tightness with left arm pain and nausea, and that the symptoms have not been present.  Current chest tightness and right shoulder discomfort typically occurs in the setting of emotional stress and has not mostly occurred with exertion.  Despite symptoms lasting up to 48 hours, he has not noticed any  change in symptoms with activity/exertion in the setting of symptoms.  He denies palpitations, PND, orthopnea, dizziness, syncope, edema, or early satiety.  He continues to smoke between a quarter and half a pack per day but notes that his primary care provider is working on getting him approval for [name].  Home Medications    Prior to Admission medications   Medication Sig Start Date End Date Taking? Authorizing Provider  ALPRAZolam Duanne Moron) 0.5 MG tablet Take 1 tablet (0.5 mg total) by mouth 3 (three) times daily as needed. for anxiety 06/10/19   McLean-Scocuzza, Nino Glow, MD  amiodarone (PACERONE) 200 MG tablet Take  1.5 tablets (300 mg total) by mouth daily. 11/03/18   Minna Merritts, MD  aspirin 81 MG EC tablet Take 1 tablet (81 mg total) by mouth daily. 09/11/15   Minna Merritts, MD  atorvastatin (LIPITOR) 40 MG tablet Take 1 tablet (40 mg total) by mouth daily. 11/03/18   Minna Merritts, MD  carvedilol (COREG) 3.125 MG tablet Take 1 tablet (3.125 mg total) by mouth 2 (two) times daily. 11/03/18   Minna Merritts, MD  clopidogrel (PLAVIX) 75 MG tablet Take 1 tablet (75 mg total) by mouth daily. 11/03/18   Minna Merritts, MD  ezetimibe (ZETIA) 10 MG tablet Take 1 tablet (10 mg total) by mouth daily. 11/03/18   Minna Merritts, MD  furosemide (LASIX) 40 MG tablet Take 1 tablet (40 mg total) by mouth 2 (two) times daily. Patient taking differently: Take 80 mg by mouth daily.  11/03/18   Minna Merritts, MD  ipratropium-albuterol (DUONEB) 0.5-2.5 (3) MG/3ML SOLN Take 3 mLs by nebulization every 6 (six) hours as needed. 09/14/18   Fritzi Mandes, MD  mometasone-formoterol [facility]) 200-5 MCG/ACT AERO Inhale 2 puffs into the lungs 2 (two) times daily. 01/14/19   Laverle Hobby, MD  potassium chloride (KLOR-CON 10) 10 MEQ tablet Take 1 tablet (10 mEq total) by mouth daily. 11/03/18   Minna Merritts, MD  sacubitril-valsartan (ENTRESTO) 97-103 MG Take 1 tablet by mouth 2 (two) times daily. 04/19/19   Minna Merritts, MD  spironolactone (ALDACTONE) 25 MG tablet Take 1 tablet (25 mg total) by mouth daily. 11/03/18   Minna Merritts, MD  tadalafil (CIALIS) 10 MG tablet Take 1 tablet (10 mg total) by mouth daily as needed for erectile dysfunction (take 30 min prior to when needed.). 12/30/16 11/01/19  Minna Merritts, MD    Review of Systems    Episodic chest tightness and right shoulder pain occurring about once every 2 weeks and lasting 1 to 2 days at a time as outlined above.  He denies dyspnea, PND, orthopnea, palpitations, dizziness, syncope, edema, or early satiety.  All other systems reviewed and are  otherwise negative except as noted above.  Physical Exam    VS:  There were no vitals taken for this visit. , BMI There is no height or weight on file to calculate BMI. GEN: Well nourished, well developed, in no acute distress. HEENT: normal. Neck: Supple, no JVD, carotid bruits, or masses. Cardiac: RRR, no murmurs, rubs, or gallops. No clubbing, cyanosis, edema.  Radials/DP/PT 2+ and equal bilaterally.  Respiratory:  Respirations regular and unlabored, clear to auscultation bilaterally. GI: Soft, nontender, nondistended, BS + x 4. MS: no deformity or atrophy. Skin: warm and dry, no rash. Neuro:  Strength and sensation are intact. Psych: Normal affect.  Accessory Clinical Findings    ECG personally reviewed by me today -regular sinus  rhythm, 61, prior anteroseptal infarct- no acute changes.  Lab Results  Component Value Date   WBC 6.8 06/10/2019   HGB 13.8 06/10/2019   HCT 42.2 06/10/2019   MCV 88.7 06/10/2019   PLT 164.0 06/10/2019   Lab Results  Component Value Date   CREATININE 1.44 06/10/2019   BUN 19 06/10/2019   NA 138 06/10/2019   K 3.8 06/10/2019   CL 104 06/10/2019   CO2 26 06/10/2019   Lab Results  Component Value Date   ALT 29 06/10/2019   AST 22 06/10/2019   ALKPHOS 117 06/10/2019   BILITOT 0.4 06/10/2019   Lab Results  Component Value Date   CHOL 142 06/10/2019   HDL 46.40 06/10/2019   LDLCALC 69 06/10/2019   TRIG 133.0 06/10/2019   CHOLHDL 3 06/10/2019    Lab Results  Component Value Date   HGBA1C 5.7 06/10/2019    Assessment & Plan    1.  Midsternal chest tightness/coronary artery disease: Status post prior LAD, OM1, RPDA, and RCA stenting.  He has done well for several years but over the past 2 to 3 months, he is noted chest tightness with right shoulder pain occurring about once every 2 weeks and lasting up to 2 days at a time, in a constant fashion.  Current symptoms are not reminiscent of prior anginal equivalent which were chest  tightness, left arm pain, and nausea.  Symptoms typically occur with emotional upset and he has not had any exertional symptoms.  It is because of the symptoms that he arrange for this appointment today.  ECG shows prior anteroseptal infarct which is unchanged.  Duration of chest pain w/o limitations in activity make this presentation atypical.  We discussed options for management and will proceed with a Lexiscan Myoview to assess for ischemia.  He remains on aspirin, statin, beta-blocker, Plavix, Zetia, and Entresto therapy.  2.  HFrEF/ischemic cardiomyopathy: He has been doing well from a heart failure standpoint and is euvolemic today.  He tolerates and is compliant with his beta-blocker, Entresto, furosemide, and spironolactone.  3.  Essential hypertension: Stable.  4.  Hyperlipidemia: Recent lipids show an LDL of 69 with normal LFTs.  Continue statin and Zetia therapy.  5.  Tobacco abuse: Still smoking but cut back to a quarter-half a pack a day.  He previously had some success with [name] but was not able to get this covered by his insurance.  He says today that his primary care provider is working to secure [name] for him going forward.  Complete cessation advised.  6.  History of VT: Status post ICD.  Continue beta-blocker and amiodarone therapy.  7.  Disposition: Follow-up stress testing as outlined above.  Follow-up in clinic in 1 month or sooner if necessary.  Murray Hodgkins, NP 06/30/2019, 1:08 PM

## 2019-07-01 ENCOUNTER — Ambulatory Visit: Payer: Self-pay | Admitting: Pharmacist

## 2019-07-01 DIAGNOSIS — I2510 Atherosclerotic heart disease of native coronary artery without angina pectoris: Secondary | ICD-10-CM

## 2019-07-01 DIAGNOSIS — F17200 Nicotine dependence, unspecified, uncomplicated: Secondary | ICD-10-CM

## 2019-07-01 DIAGNOSIS — J449 Chronic obstructive pulmonary disease, unspecified: Secondary | ICD-10-CM

## 2019-07-01 NOTE — Patient Instructions (Addendum)
Visit Information  Goals Addressed            This Visit's Progress     Patient Stated   . "I want to quit smoking" (pt-stated)       Current Barriers:  . Tobacco abuse of ~40 years; currently smoking 1 ppd; complicated by CAD, CHF, COPD o Previous quit attempts, successful ~ 1 year ago x 3 weeks; was previously able to quit smoking x 5 years; Using [name] w/ success before; has used patches, gum, lozenges; we decided to apply for [name] patient assistance; patient is highly motivated to quit to benefit his health and his family  . CAD s/p prior stenting; ASA 81 mg daily, clopidogrel 75 mg daily; atorvastatin 40 mg daily, ezetimibe 10 mg daily; last LDL at goal <70 given significant CV hx o F/u w/ Sharolyn Douglas yesterday; pharmacologic stress test scheduled 07/26/2019 . HFrEF s/p AICD placement (most recent EF 25-30%); Entresto 97/103 mg BID, carvedilol 3.125 mg BID, spironolactone 25 mg daily, furosemide 80 mg daily, potassium 10 mEq daily o Receives Entresto via [employer] PAP; enrolled through [employer] per patient. He collaborated w/ cards clinic on this . Ventricular tachycardia: amiodarone 300 mg daily; . COPD: Dulera 200/5 mcg BID + Spiriva 81 mg daily. Proventil HFA PRN o Receives Dulera/Proventil through [employer] patient assistance (through 02/17/2020); Spiriva through [employer] patient assistance (through 10/26/2019)  Pharmacist Clinical Goal(s):  Marland Kitchen Over the next 90 days, patient will work with PharmD and provider towards tobacco cessation  Interventions: . Received patient financial information, however, patient did not sign application. Will collaborate w/ Susy Frizzle, CPhT to mail patient portion of application to the patient. Will pass patient income information and provider portion of application along to [employer], CPhT for assistance. . Recently learned that the [place] may be able to provide [name] for free to patients through the quit line. Contacted patient; he noted he already asked  the Quit Line about this and was told they did not offer this medication. . Moving forward, can consider combining Dulera + Spiriva and switching to Trelegy. As patient does not have Medicare Part D insurance, he will qualify for assistance at any point during the year. Patient has ample supply of his inhalers at this moment   Patient Self Care Activities:  . Patient will commit to reducing tobacco consumption  Please see past updates related to this goal by clicking on the "Past Updates" button in the selected goal         The patient verbalized understanding of instructions provided today and declined a print copy of patient instruction materials.    Plan: - Will collaborate w/ patient, provider, and CPhT as above - Will outreach patient for continued medication management support in ~4-5 weeks  Catie Darnelle Maffucci, PharmD, [place], [place] Pharmacist [place] [phone]

## 2019-07-01 NOTE — Chronic Care Management (AMB) (Signed)
Chronic Care Management   Follow Up Note   07/01/2019 Name: Ian Mills MRN: [id number] DOB: July 31, 1955  Referred by: McLean-Scocuzza, Nino Glow, MD Reason for referral : Chronic Care Management (Medication Management)   Ian Mills is a 63 y.o. year old male who is a primary care patient of McLean-Scocuzza, Nino Glow, MD. The CCM team was consulted for assistance with chronic disease management and care coordination needs.    Care coordination completed today.   Review of patient status, including review of consultants reports, relevant laboratory and other test results, and collaboration with appropriate care team members and the patient's provider was performed as part of comprehensive patient evaluation and provision of chronic care management services.    SDOH (Social Determinants of Health) screening performed today: Financial Strain  Tobacco Use. See Care Plan for related entries.   Outpatient Encounter Medications as of 07/01/2019  Medication Sig Note  . ALPRAZolam (XANAX) 0.5 MG tablet Take 1 tablet (0.5 mg total) by mouth 3 (three) times daily as needed. for anxiety   . amiodarone (PACERONE) 200 MG tablet Take 1.5 tablets (300 mg total) by mouth daily.   Marland Kitchen aspirin 81 MG EC tablet Take 1 tablet (81 mg total) by mouth daily.   Marland Kitchen atorvastatin (LIPITOR) 40 MG tablet Take 1 tablet (40 mg total) by mouth daily.   . carvedilol (COREG) 3.125 MG tablet Take 1 tablet (3.125 mg total) by mouth 2 (two) times daily.   . clopidogrel (PLAVIX) 75 MG tablet Take 1 tablet (75 mg total) by mouth daily.   Marland Kitchen ezetimibe (ZETIA) 10 MG tablet Take 1 tablet (10 mg total) by mouth daily.   . furosemide (LASIX) 40 MG tablet Take 1 tablet (40 mg total) by mouth 2 (two) times daily. (Patient taking differently: Take 80 mg by mouth daily. ) 05/03/2019: Takes 80 mg at once   . ipratropium-albuterol (DUONEB) 0.5-2.5 (3) MG/3ML SOLN Take 3 mLs by nebulization every 6 (six) hours as needed.   .  mometasone-formoterol (DULERA) 200-5 MCG/ACT AERO Inhale 2 puffs into the lungs 2 (two) times daily.   . nitroGLYCERIN (NITROSTAT) 0.4 MG SL tablet Place 0.4 mg under the tongue every 5 (five) minutes as needed.    . potassium chloride (KLOR-CON 10) 10 MEQ tablet Take 1 tablet (10 mEq total) by mouth daily.   . sacubitril-valsartan (ENTRESTO) 97-103 MG Take 1 tablet by mouth 2 (two) times daily.   Marland Kitchen spironolactone (ALDACTONE) 25 MG tablet Take 1 tablet (25 mg total) by mouth daily.   . tadalafil (CIALIS) 10 MG tablet Take 1 tablet (10 mg total) by mouth daily as needed for erectile dysfunction (take 30 min prior to when needed.).    No facility-administered encounter medications on file as of 07/01/2019.      Goals Addressed            This Visit's Progress     Patient Stated   . "I want to quit smoking" (pt-stated)       Current Barriers:  . Tobacco abuse of ~40 years; currently smoking 1 ppd; complicated by CAD, CHF, COPD o Previous quit attempts, successful ~ 1 year ago x 3 weeks; was previously able to quit smoking x 5 years; Using [name] w/ success before; has used patches, gum, lozenges; we decided to apply for [name] patient assistance; patient is highly motivated to quit to benefit his health and his family  . CAD s/p prior stenting; ASA 81 mg daily, clopidogrel 75 mg  daily; atorvastatin 40 mg daily, ezetimibe 10 mg daily; last LDL at goal <70 given significant CV hx o F/u w/ Sharolyn Douglas yesterday; pharmacologic stress test scheduled 07/26/2019 . HFrEF s/p AICD placement (most recent EF 25-30%); Entresto 97/103 mg BID, carvedilol 3.125 mg BID, spironolactone 25 mg daily, furosemide 80 mg daily, potassium 10 mEq daily o Receives Entresto via [employer] PAP; enrolled through [employer] per patient. He collaborated w/ cards clinic on this . Ventricular tachycardia: amiodarone 300 mg daily; . COPD: Dulera 200/5 mcg BID + Spiriva 81 mg daily. Proventil HFA PRN o Receives Dulera/Proventil through  [employer] patient assistance (through 02/17/2020); Spiriva through [employer] patient assistance (through 10/26/2019)  Pharmacist Clinical Goal(s):  Marland Kitchen Over the next 90 days, patient will work with PharmD and provider towards tobacco cessation  Interventions: . Received patient financial information, however, patient did not sign application. Will collaborate w/ Susy Frizzle, CPhT to mail patient portion of application to the patient. Will pass patient income information and provider portion of application along to [employer], CPhT for assistance. . Recently learned that the [place] may be able to provide [name] for free to patients through the quit line. Contacted patient; he noted he already asked the Quit Line about this and was told they did not offer this medication. . Moving forward, can consider combining Dulera + Spiriva and switching to Trelegy. As patient does not have Medicare Part D insurance, he will qualify for assistance at any point during the year. Patient has ample supply of his inhalers at this moment   Patient Self Care Activities:  . Patient will commit to reducing tobacco consumption  Please see past updates related to this goal by clicking on the "Past Updates" button in the selected goal          Plan: - Will collaborate w/ patient, provider, and CPhT as above - Will outreach patient for continued medication management support in ~4-5 weeks  Catie Darnelle Maffucci, PharmD, [place], [place] Pharmacist [place] [phone]

## 2019-07-06 ENCOUNTER — Other Ambulatory Visit: Payer: Self-pay | Admitting: Pharmacy Technician

## 2019-07-06 NOTE — Patient Outreach (Addendum)
[facility]) Care Management  07/06/2019  Ian Mills 1955/10/22 [id number]                                        Medication Assistance Referral  Referral From: [facility] Embedded RPh Catie T.   Medication/Company: [employer] / [employer] Patient application portion:  Education officer, museum portion:  N/A Embedded pharmacist had signed in the office to [name], MD Provider address/fax verified via: Call to office    Follow up:  Will follow up with patient in 5-10 business days to confirm application(s) have been received.  Tasnim Balentine P. Christophere [place], [name] Management [phone]

## 2019-07-13 NOTE — Progress Notes (Signed)
Remote ICD transmission.   

## 2019-07-15 ENCOUNTER — Telehealth: Payer: Self-pay | Admitting: Internal Medicine

## 2019-07-15 ENCOUNTER — Ambulatory Visit: Payer: Self-pay | Admitting: Pharmacist

## 2019-07-15 DIAGNOSIS — I25119 Atherosclerotic heart disease of native coronary artery with unspecified angina pectoris: Secondary | ICD-10-CM

## 2019-07-15 DIAGNOSIS — J449 Chronic obstructive pulmonary disease, unspecified: Secondary | ICD-10-CM

## 2019-07-15 DIAGNOSIS — I5023 Acute on chronic systolic (congestive) heart failure: Secondary | ICD-10-CM

## 2019-07-15 NOTE — Chronic Care Management (AMB) (Signed)
Chronic Care Management   Follow Up Note   07/15/2019 Name: Ian Mills MRN: [id number] DOB: 22-May-1956  Referred by: Ian Mills Reason for referral : Chronic Care Management (Medication Management)   Ian Mills is a 63 y.o. year old male who is a primary care patient of Ian Mills. The CCM team was consulted for assistance with chronic disease management and care coordination needs.    Received message from [place] to call patient, as he had medication questions.   Review of patient status, including review of consultants reports, relevant laboratory and other test results, and collaboration with appropriate care team members and the patient's provider was performed as part of comprehensive patient evaluation and provision of chronic care management services.    SDOH (Social Determinants of Health) screening performed today: Financial Strain . See Care Plan for related entries.   Outpatient Encounter Medications as of 07/15/2019  Medication Sig Note  . ALPRAZolam (XANAX) 0.5 MG tablet Take 1 tablet (0.5 mg total) by mouth 3 (three) times daily as needed. for anxiety   . amiodarone (PACERONE) 200 MG tablet Take 1.5 tablets (300 mg total) by mouth daily.   Marland Kitchen aspirin 81 MG EC tablet Take 1 tablet (81 mg total) by mouth daily.   Marland Kitchen atorvastatin (LIPITOR) 40 MG tablet Take 1 tablet (40 mg total) by mouth daily.   . carvedilol (COREG) 3.125 MG tablet Take 1 tablet (3.125 mg total) by mouth 2 (two) times daily.   . clopidogrel (PLAVIX) 75 MG tablet Take 1 tablet (75 mg total) by mouth daily.   Marland Kitchen ezetimibe (ZETIA) 10 MG tablet Take 1 tablet (10 mg total) by mouth daily.   . furosemide (LASIX) 40 MG tablet Take 1 tablet (40 mg total) by mouth 2 (two) times daily. (Patient taking differently: Take 80 mg by mouth daily. ) 05/03/2019: Takes 80 mg at once   . ipratropium-albuterol (DUONEB) 0.5-2.5 (3) MG/3ML SOLN Take 3 mLs by nebulization every 6 (six)  hours as needed.   . mometasone-formoterol (DULERA) 200-5 MCG/ACT AERO Inhale 2 puffs into the lungs 2 (two) times daily.   . nitroGLYCERIN (NITROSTAT) 0.4 MG SL tablet Place 0.4 mg under the tongue every 5 (five) minutes as needed.    . potassium chloride (KLOR-CON 10) 10 MEQ tablet Take 1 tablet (10 mEq total) by mouth daily.   . sacubitril-valsartan (ENTRESTO) 97-103 MG Take 1 tablet by mouth 2 (two) times daily.   Marland Kitchen spironolactone (ALDACTONE) 25 MG tablet Take 1 tablet (25 mg total) by mouth daily.   . tadalafil (CIALIS) 10 MG tablet Take 1 tablet (10 mg total) by mouth daily as needed for erectile dysfunction (take 30 min prior to when needed.).    No facility-administered encounter medications on file as of 07/15/2019.     Goals Addressed            This Visit's Progress     Patient Stated   . "I want to quit smoking" (pt-stated)       Current Barriers:  . Tobacco abuse of ~40 years; currently smoking 1 ppd; complicated by CAD, CHF, COPD o Previous quit attempts, successful ~ 1 year ago x 3 weeks; was previously able to quit smoking x 5 years; Using [name] w/ success before; has used patches, gum, lozenges; we decided to apply for [name] patient assistance; patient is highly motivated to quit to benefit his health and his family  . CAD s/p prior stenting; ASA  81 mg daily, clopidogrel 75 mg daily; atorvastatin 40 mg daily, ezetimibe 10 mg daily; last LDL at goal <70 given significant CV hx; pharmacologic stress test planned  . HFrEF s/p AICD placement (most recent EF 25-30%); Entresto 97/103 mg BID, carvedilol 3.125 mg BID, spironolactone 25 mg daily, furosemide 80 mg daily, potassium 10 mEq daily o Receives Entresto via [employer] PAP; enrolled through [employer] per patient. He collaborated w/ cards clinic on this . Ventricular tachycardia: amiodarone 300 mg daily; . COPD: Dulera 200/5 mcg BID + Spiriva 81 mg daily. Proventil HFA PRN o Receives Dulera/Proventil through [employer] patient  assistance (through 02/17/2020); Spiriva through [employer] patient assistance (through 07/29/2019, previously though he was approved for an entire year 10/26/2018-10/26/2019 as patient does not have a Medicare Part D plan, however, he was only approved through the end of the calendar year) o [facility] CPhT Etter Sjogren received call from patient with questions about re-enrollment for 2021 for medications.   Pharmacist Clinical Goal(s):  Marland Kitchen Over the next 90 days, patient will work with PharmD and provider towards tobacco cessation  Interventions: . Contacted patient, left message for him to return my call to discuss his questions regarding medication access support. Will plan to discuss process to reapply for Spiriva through [place].  Patient Self Care Activities:  . Patient will commit to reducing tobacco consumption  Please see past updates related to this goal by clicking on the "Past Updates" button in the selected goal          Plan: - If I do not hear back from patient, will outreach in ~2-3 weeks to discuss progress on patient assistance applications  Catie Darnelle Maffucci, PharmD, [place], [place] Pharmacist [facility] [phone]

## 2019-07-15 NOTE — Patient Instructions (Signed)
Visit Information  Goals Addressed            This Visit's Progress     Patient Stated   . "I want to quit smoking" (pt-stated)       Current Barriers:  . Tobacco abuse of ~40 years; currently smoking 1 ppd; complicated by CAD, CHF, COPD o Previous quit attempts, successful ~ 1 year ago x 3 weeks; was previously able to quit smoking x 5 years; Using [name] w/ success before; has used patches, gum, lozenges; we decided to apply for [name] patient assistance; patient is highly motivated to quit to benefit his health and his family  . CAD s/p prior stenting; ASA 81 mg daily, clopidogrel 75 mg daily; atorvastatin 40 mg daily, ezetimibe 10 mg daily; last LDL at goal <70 given significant CV hx; pharmacologic stress test planned  . HFrEF s/p AICD placement (most recent EF 25-30%); Entresto 97/103 mg BID, carvedilol 3.125 mg BID, spironolactone 25 mg daily, furosemide 80 mg daily, potassium 10 mEq daily o Receives Entresto via [employer] PAP; enrolled through [employer] per patient. He collaborated w/ cards clinic on this . Ventricular tachycardia: amiodarone 300 mg daily; . COPD: Dulera 200/5 mcg BID + Spiriva 81 mg daily. Proventil HFA PRN o Receives Dulera/Proventil through [employer] patient assistance (through 02/17/2020); Spiriva through [employer] patient assistance (through 07/29/2019, previously though he was approved for an entire year 10/26/2018-10/26/2019 as patient does not have a Medicare Part D plan, however, he was only approved through the end of the calendar year) o [facility] CPhT Etter Sjogren received call from patient with questions about re-enrollment for 2021 for medications.   Pharmacist Clinical Goal(s):  Marland Kitchen Over the next 90 days, patient will work with PharmD and provider towards tobacco cessation  Interventions: . Contacted patient, left message for him to return my call to discuss his questions regarding medication access support. Will plan to discuss process to reapply for Spiriva through  [employer].  Patient Self Care Activities:  . Patient will commit to reducing tobacco consumption  Please see past updates related to this goal by clicking on the "Past Updates" button in the selected goal         The patient verbalized understanding of instructions provided today and declined a print copy of patient instruction materials.    Plan: - If I do not hear back from patient, will outreach in ~2-3 weeks to discuss progress on patient assistance applications  Catie Darnelle Maffucci, PharmD, [place], [place] Pharmacist [facility] [phone]

## 2019-07-15 NOTE — Telephone Encounter (Signed)
Pt should be following with  pulmonary Dr. Patsey Berthold upcoming appt in 08/2019 for all pulmonary needs and medications   [place]

## 2019-07-16 ENCOUNTER — Other Ambulatory Visit: Payer: Self-pay | Admitting: Pharmacy Technician

## 2019-07-16 ENCOUNTER — Ambulatory Visit: Payer: Self-pay | Admitting: Pharmacist

## 2019-07-16 NOTE — Patient Outreach (Addendum)
[facility]) Care Management  07/16/2019  JAQUI OFTEDAHL 02-12-1956 [id number]   Successful outreach call placed to patient in regards to [name] application with [employer].  Spoke to patient, HIPAA identifiers verified.  Patient informed he received the application and placed it back in the mail to me today.   Will followup with patient as previously scheduled after the holiday season/upcoming PAL.  Kaneisha Ellenberger P. Eran Windish, [place] Management [phone]

## 2019-07-16 NOTE — Chronic Care Management (AMB) (Signed)
  Chronic Care Management   Note  07/16/2019 Name: FURKAN GEREN MRN: [id number] DOB: 10/29/1955  JOSEMIGUEL SETTLE is a 63 y.o. year old male who is a primary care patient of McLean-Scocuzza, Nino Glow, MD. The CCM team was consulted for assistance with chronic disease management and care coordination needs.    Collaborated w/ CPhT to discuss plans for medication assistance; attempted to outreach patient again today to discuss, but had to leave a voicemail.   Follow up plan: - Will attempt outreach again next week  Catie Darnelle Maffucci, PharmD, [place] [phone]

## 2019-07-19 ENCOUNTER — Telehealth: Payer: Self-pay

## 2019-07-19 ENCOUNTER — Ambulatory Visit (INDEPENDENT_AMBULATORY_CARE_PROVIDER_SITE_OTHER): Payer: Medicare Other | Admitting: Pharmacist

## 2019-07-19 DIAGNOSIS — I5022 Chronic systolic (congestive) heart failure: Secondary | ICD-10-CM | POA: Diagnosis not present

## 2019-07-19 DIAGNOSIS — J449 Chronic obstructive pulmonary disease, unspecified: Secondary | ICD-10-CM | POA: Diagnosis not present

## 2019-07-19 NOTE — Patient Instructions (Signed)
Visit Information  Goals Addressed            This Visit's Progress     Patient Stated   . "I want to quit smoking" (pt-stated)       Current Barriers:  . Tobacco abuse of ~40 years; currently smoking 1 ppd; complicated by CAD, CHF, COPD o Previous quit attempts, successful ~ 1 year ago x 3 weeks; was previously able to quit smoking x 5 years; Using [name] w/ success before; has used patches, gum, lozenges;  o In process of applying for [name] PAP; have received all portions except patient signature, which he mailed back to CPhT on 07/16/2019 . CAD s/p prior stenting; ASA 81 mg daily, clopidogrel 75 mg daily; atorvastatin 40 mg daily, ezetimibe 10 mg daily; last LDL at goal <70 given significant CV hx; pharmacologic stress test planned  . HFrEF s/p AICD placement (most recent EF 25-30%); Entresto 97/103 mg BID, carvedilol 3.125 mg BID, spironolactone 25 mg daily, furosemide 80 mg daily, potassium 10 mEq daily o Receives Entresto via [employer] PAP, collaborated w/ cards clinic onthis; He notes cards clinic needs his income information, which he provided to [place]; wonders if we can send this to [place] at cards clinic directly . Ventricular tachycardia: amiodarone 300 mg daily; . COPD: Dulera 200/5 mcg BID + Spiriva 81 mg daily. Proventil HFA PRN. Notes he is using Spiriva QAM, though using Dulera infrequently because he doesn't feel like it provides benefit. May be that LAMA monotherapy or LAMA/LABA would be most appropriate. Appt w/ Dr. Patsey Berthold in January. o Receives Dulera/Proventil through [employer] patient assistance (through 02/17/2020); Spiriva through [facility] patient assistance (through 07/29/2019). Notes he has a significant supply of all inhalers right now  Pharmacist Clinical Goal(s):  Marland Kitchen Over the next 90 days, patient will work with PharmD and provider towards tobacco cessation . Over the next 90 days, patient will work with multidisciplinary team on medication  access.  Interventions: . Contacted patient. He wonders if we can collaborate w/ cardiology clinic on providing income information to them, as he has already provided it to [place] for [name] application. He is going to call [name], RN about this today, and will provide my contact information to her . Decided to hold on applying for inhaler assistance until after appointment w/ Dr Patsey Berthold to determine what the most appropriate pharmacotherapy is. Will inbasket her today with this information. . Once patient signature on [name] application is received, CPhT will fax to patient assistance company.   Patient Self Care Activities:  . Patient will commit to reducing tobacco consumption  Please see past updates related to this goal by clicking on the "Past Updates" button in the selected goal         The patient verbalized understanding of instructions provided today and declined a print copy of patient instruction materials.   Plan: - Will collaborate w/ multidisciplinary team as above - Scheduled outreach on 08/16/2019 at 9 am to discuss plans made at pulmonary appointment on 08/12/2019  Catie Darnelle Maffucci, PharmD, [place], [place] Pharmacist [place] [phone]

## 2019-07-19 NOTE — Telephone Encounter (Signed)
Patient returning call to [name] to discuss simplifying medication needs.

## 2019-07-19 NOTE — Progress Notes (Signed)
Noted  

## 2019-07-19 NOTE — Telephone Encounter (Signed)
Attempted to call patient. LMTCB 07/19/2019   

## 2019-07-19 NOTE — Telephone Encounter (Signed)
Call to patient to further discuss incoming fax from [employer] PAF needing additional information.   No answer. LMTCB.

## 2019-07-19 NOTE — Chronic Care Management (AMB) (Signed)
Chronic Care Management   Follow Up Note   07/19/2019 Name: Ian Mills MRN: [id number] DOB: 1956-07-05  Referred by: McLean-Scocuzza, Nino Glow, MD Reason for referral : Chronic Care Management (Medication Management)   Ian Mills is a 63 y.o. year old male who is a primary care patient of McLean-Scocuzza, Nino Glow, MD. The CCM team was consulted for assistance with chronic disease management and care coordination needs.    Contacted patient today to discuss medication access plans.  Review of patient status, including review of consultants reports, relevant laboratory and other test results, and collaboration with appropriate care team members and the patient's provider was performed as part of comprehensive patient evaluation and provision of chronic care management services.    SDOH (Social Determinants of Health) screening performed today: Financial Strain . See Care Plan for related entries.   Outpatient Encounter Medications as of 07/19/2019  Medication Sig Note  . albuterol (VENTOLIN HFA) 108 (90 Base) MCG/ACT inhaler Inhale 1 puff into the lungs every 6 (six) hours as needed for wheezing or shortness of breath. 07/19/2019: Only needing QAM  . mometasone-formoterol (DULERA) 200-5 MCG/ACT AERO Inhale 2 puffs into the lungs 2 (two) times daily. 07/19/2019: Taking irregularly   . tiotropium (SPIRIVA) 18 MCG inhalation capsule Place 18 mcg into inhaler and inhale daily. 07/19/2019: QAM  . ALPRAZolam (XANAX) 0.5 MG tablet Take 1 tablet (0.5 mg total) by mouth 3 (three) times daily as needed. for anxiety   . amiodarone (PACERONE) 200 MG tablet Take 1.5 tablets (300 mg total) by mouth daily.   Marland Kitchen aspirin 81 MG EC tablet Take 1 tablet (81 mg total) by mouth daily.   Marland Kitchen atorvastatin (LIPITOR) 40 MG tablet Take 1 tablet (40 mg total) by mouth daily.   . carvedilol (COREG) 3.125 MG tablet Take 1 tablet (3.125 mg total) by mouth 2 (two) times daily.   . clopidogrel (PLAVIX) 75 MG tablet  Take 1 tablet (75 mg total) by mouth daily.   Marland Kitchen ezetimibe (ZETIA) 10 MG tablet Take 1 tablet (10 mg total) by mouth daily.   . furosemide (LASIX) 40 MG tablet Take 1 tablet (40 mg total) by mouth 2 (two) times daily. (Patient taking differently: Take 80 mg by mouth daily. ) 05/03/2019: Takes 80 mg at once   . ipratropium-albuterol (DUONEB) 0.5-2.5 (3) MG/3ML SOLN Take 3 mLs by nebulization every 6 (six) hours as needed. (Patient not taking: Reported on 07/19/2019)   . nitroGLYCERIN (NITROSTAT) 0.4 MG SL tablet Place 0.4 mg under the tongue every 5 (five) minutes as needed.    . potassium chloride (KLOR-CON 10) 10 MEQ tablet Take 1 tablet (10 mEq total) by mouth daily.   . sacubitril-valsartan (ENTRESTO) 97-103 MG Take 1 tablet by mouth 2 (two) times daily.   Marland Kitchen spironolactone (ALDACTONE) 25 MG tablet Take 1 tablet (25 mg total) by mouth daily.   . tadalafil (CIALIS) 10 MG tablet Take 1 tablet (10 mg total) by mouth daily as needed for erectile dysfunction (take 30 min prior to when needed.).    No facility-administered encounter medications on file as of 07/19/2019.     Goals Addressed            This Visit's Progress     Patient Stated   . "I want to quit smoking" (pt-stated)       Current Barriers:  . Tobacco abuse of ~40 years; currently smoking 1 ppd; complicated by CAD, CHF, COPD o Previous quit attempts, successful ~  1 year ago x 3 weeks; was previously able to quit smoking x 5 years; Using [name] w/ success before; has used patches, gum, lozenges;  o In process of applying for [name] PAP; have received all portions except patient signature, which he mailed back to CPhT on 07/16/2019 . CAD s/p prior stenting; ASA 81 mg daily, clopidogrel 75 mg daily; atorvastatin 40 mg daily, ezetimibe 10 mg daily; last LDL at goal <70 given significant CV hx; pharmacologic stress test planned  . HFrEF s/p AICD placement (most recent EF 25-30%); Entresto 97/103 mg BID, carvedilol 3.125 mg BID,  spironolactone 25 mg daily, furosemide 80 mg daily, potassium 10 mEq daily o Receives Entresto via [employer] PAP, collaborated w/ cards clinic onthis; He notes cards clinic needs his income information, which he provided to [place]; wonders if we can send this to [place] at cards clinic directly . Ventricular tachycardia: amiodarone 300 mg daily; . COPD: Dulera 200/5 mcg BID + Spiriva 81 mg daily. Proventil HFA PRN. Notes he is using Spiriva QAM, though using Dulera infrequently because he doesn't feel like it provides benefit. May be that LAMA monotherapy or LAMA/LABA would be most appropriate. Appt w/ Dr. Patsey Berthold in January. o Receives Dulera/Proventil through [employer] patient assistance (through 02/17/2020); Spiriva through [facility] patient assistance (through 07/29/2019). Notes he has a significant supply of all inhalers right now  Pharmacist Clinical Goal(s):  Marland Kitchen Over the next 90 days, patient will work with PharmD and provider towards tobacco cessation . Over the next 90 days, patient will work with multidisciplinary team on medication access.  Interventions: . Contacted patient. He wonders if we can collaborate w/ cardiology clinic on providing income information to them, as he has already provided it to [place] for [name] application. He is going to call [name], RN about this today, and will provide my contact information to her . Decided to hold on applying for inhaler assistance until after appointment w/ Dr Patsey Berthold to determine what the most appropriate pharmacotherapy is. Will inbasket her today with this information. . Once patient signature on [name] application is received, CPhT will fax to patient assistance company.   Patient Self Care Activities:  . Patient will commit to reducing tobacco consumption  Please see past updates related to this goal by clicking on the "Past Updates" button in the selected goal          Plan: - Will collaborate w/ multidisciplinary team as above - Scheduled outreach on  08/16/2019 at 9 am to discuss plans made at pulmonary appointment on 08/12/2019  Catie Darnelle Maffucci, PharmD, [place], [place] Pharmacist [place] [phone]

## 2019-07-20 NOTE — Telephone Encounter (Signed)
Attempted to call pt back again and had to leave a message.. need to ask if he had filled out all information on his pt assistance forms and if he still has them or if they have been given to [facility].

## 2019-07-20 NOTE — Telephone Encounter (Signed)
Patient calling back regarding medications. Advised patient will be called back when nurse is available

## 2019-07-21 ENCOUNTER — Ambulatory Visit: Payer: Self-pay | Admitting: Pharmacist

## 2019-07-21 ENCOUNTER — Telehealth: Payer: Self-pay | Admitting: Internal Medicine

## 2019-07-21 DIAGNOSIS — I5023 Acute on chronic systolic (congestive) heart failure: Secondary | ICD-10-CM

## 2019-07-21 NOTE — Patient Instructions (Signed)
Visit Information  Goals Addressed            This Visit's Progress     Patient Stated   . "I want to quit smoking" (pt-stated)       Current Barriers:  . Tobacco abuse of ~40 years; currently smoking 1 ppd; complicated by CAD, CHF, COPD o Previous quit attempts, successful ~ 1 year ago x 3 weeks; was previously able to quit smoking x 5 years; Using [name] w/ success before; has used patches, gum, lozenges;  o In process of applying for [name] PAP; have received all portions except patient signature, which he mailed back to CPhT on 07/16/2019 . CAD s/p prior stenting; ASA 81 mg daily, clopidogrel 75 mg daily; atorvastatin 40 mg daily, ezetimibe 10 mg daily; last LDL at goal <70 given significant CV hx; pharmacologic stress test planned  . HFrEF s/p AICD placement (most recent EF 25-30%); Entresto 97/103 mg BID, carvedilol 3.125 mg BID, spironolactone 25 mg daily, furosemide 80 mg daily, potassium 10 mEq daily o Receives Entresto via [employer] PAP, per cardiology office, they are not working on the reapplication. Unsure what patient's enrollment end date is.  . Ventricular tachycardia: amiodarone 300 mg daily; . COPD: Dulera 200/5 mcg BID + Spiriva 81 mg daily. Proventil HFA PRN. Spiriva QAM, Ruthe Mannan PRN May be that LAMA monotherapy or LAMA/LABA would be most appropriate. Appt w/ Dr. Patsey Berthold in January. o Receives Dulera/Proventil through [employer] patient assistance (through 02/17/2020); Spiriva through [facility] patient assistance (through 07/29/2019). Notes he has a significant supply of all inhalers right now  Pharmacist Clinical Goal(s):  Marland Kitchen Over the next 90 days, patient will work with PharmD and provider towards tobacco cessation . Over the next 90 days, patient will work with multidisciplinary team on medication access.  Interventions: . [employer]. They note that his enrollment expires on 07/29/2019. His ID is [id number]. They have already received everything needed for assistance  reapplication, except a copy of his Medicare A/B card. Printed this, faxed in to [employer] today.   Patient Self Care Activities:  . Patient will commit to reducing tobacco consumption  Please see past updates related to this goal by clicking on the "Past Updates" button in the selected goal         The patient verbalized understanding of instructions provided today and declined a print copy of patient instruction materials.   Plan: - Will collaborate w/ CPhT to follow up with [employer] regarding Entresto re-application.  - Will outreach patient as scheduled.   Catie Darnelle Maffucci, PharmD, [place], CPP Clinical Pharmacist [place] [phone]

## 2019-07-21 NOTE — Telephone Encounter (Signed)
Called Dr. Jerline Pain office at [phone] in order to discuss patient assistance forms per pt request.   Pt stated we needed to speak to the pharmacist, Katie. Joellen Jersey is out of the office today but the receptionist left a message for her to call us back.

## 2019-07-21 NOTE — Chronic Care Management (AMB) (Signed)
Chronic Care Management   Follow Up Note   07/21/2019 Name: HOSEA FULK MRN: [id number] DOB: [birth year]  Referred by: McLean-Scocuzza, Nino Glow, MD Reason for referral : Chronic Care Management (Medication Management)   LOC WROBLESKI is a 63 y.o. year old male who is a primary care patient of McLean-Scocuzza, Nino Glow, MD. The CCM team was consulted for assistance with chronic disease management and care coordination needs.    Care coordination completed today.   Review of patient status, including review of consultants reports, relevant laboratory and other test results, and collaboration with appropriate care team members and the patient's provider was performed as part of comprehensive patient evaluation and provision of chronic care management services.    SDOH (Social Determinants of Health) screening performed today: Financial Strain . See Care Plan for related entries.   Outpatient Encounter Medications as of 07/21/2019  Medication Sig Note  . albuterol (VENTOLIN HFA) 108 (90 Base) MCG/ACT inhaler Inhale 1 puff into the lungs every 6 (six) hours as needed for wheezing or shortness of breath. 07/19/2019: Only needing QAM  . ALPRAZolam (XANAX) 0.5 MG tablet Take 1 tablet (0.5 mg total) by mouth 3 (three) times daily as needed. for anxiety   . amiodarone (PACERONE) 200 MG tablet Take 1.5 tablets (300 mg total) by mouth daily.   Marland Kitchen aspirin 81 MG EC tablet Take 1 tablet (81 mg total) by mouth daily.   Marland Kitchen atorvastatin (LIPITOR) 40 MG tablet Take 1 tablet (40 mg total) by mouth daily.   . carvedilol (COREG) 3.125 MG tablet Take 1 tablet (3.125 mg total) by mouth 2 (two) times daily.   . clopidogrel (PLAVIX) 75 MG tablet Take 1 tablet (75 mg total) by mouth daily.   Marland Kitchen ezetimibe (ZETIA) 10 MG tablet Take 1 tablet (10 mg total) by mouth daily.   . furosemide (LASIX) 40 MG tablet Take 1 tablet (40 mg total) by mouth 2 (two) times daily. (Patient taking differently: Take 80 mg by mouth  daily. ) 05/03/2019: Takes 80 mg at once   . ipratropium-albuterol (DUONEB) 0.5-2.5 (3) MG/3ML SOLN Take 3 mLs by nebulization every 6 (six) hours as needed. (Patient not taking: Reported on 07/19/2019)   . mometasone-formoterol (DULERA) 200-5 MCG/ACT AERO Inhale 2 puffs into the lungs 2 (two) times daily. 07/19/2019: Taking irregularly   . nitroGLYCERIN (NITROSTAT) 0.4 MG SL tablet Place 0.4 mg under the tongue every 5 (five) minutes as needed.    . potassium chloride (KLOR-CON 10) 10 MEQ tablet Take 1 tablet (10 mEq total) by mouth daily.   . sacubitril-valsartan (ENTRESTO) 97-103 MG Take 1 tablet by mouth 2 (two) times daily.   Marland Kitchen spironolactone (ALDACTONE) 25 MG tablet Take 1 tablet (25 mg total) by mouth daily.   . tadalafil (CIALIS) 10 MG tablet Take 1 tablet (10 mg total) by mouth daily as needed for erectile dysfunction (take 30 min prior to when needed.).   Marland Kitchen tiotropium (SPIRIVA) 18 MCG inhalation capsule Place 18 mcg into inhaler and inhale daily. 07/19/2019: QAM   No facility-administered encounter medications on file as of 07/21/2019.     Goals Addressed            This Visit's Progress     Patient Stated   . "I want to quit smoking" (pt-stated)       Current Barriers:  . Tobacco abuse of ~40 years; currently smoking 1 ppd; complicated by CAD, CHF, COPD o Previous quit attempts, successful ~ 1 year ago  x 3 weeks; was previously able to quit smoking x 5 years; Using [name] w/ success before; has used patches, gum, lozenges;  o In process of applying for [name] PAP; have received all portions except patient signature, which he mailed back to CPhT on 07/16/2019 . CAD s/p prior stenting; ASA 81 mg daily, clopidogrel 75 mg daily; atorvastatin 40 mg daily, ezetimibe 10 mg daily; last LDL at goal <70 given significant CV hx; pharmacologic stress test planned  . HFrEF s/p AICD placement (most recent EF 25-30%); Entresto 97/103 mg BID, carvedilol 3.125 mg BID, spironolactone 25 mg  daily, furosemide 80 mg daily, potassium 10 mEq daily o Receives Entresto via [employer] PAP, per cardiology office, they are not working on the reapplication. Unsure what patient's enrollment end date is.  . Ventricular tachycardia: amiodarone 300 mg daily; . COPD: Dulera 200/5 mcg BID + Spiriva 81 mg daily. Proventil HFA PRN. Spiriva QAM, Ruthe Mannan PRN May be that LAMA monotherapy or LAMA/LABA would be most appropriate. Appt w/ Dr. Patsey Berthold in January. o Receives Dulera/Proventil through [employer] patient assistance (through 02/17/2020); Spiriva through [facility] patient assistance (through 07/29/2019). Notes he has a significant supply of all inhalers right now  Pharmacist Clinical Goal(s):  Marland Kitchen Over the next 90 days, patient will work with PharmD and provider towards tobacco cessation . Over the next 90 days, patient will work with multidisciplinary team on medication access.  Interventions: . [employer]. They note that his enrollment expires on 07/29/2019. They have already received everything needed for assistance reapplication, except a copy of his Medicare A/B card. Printed this, faxed in to [employer] today.   Patient Self Care Activities:  . Patient will commit to reducing tobacco consumption  Please see past updates related to this goal by clicking on the "Past Updates" button in the selected goal          Plan: - Will collaborate w/ CPhT to follow up with [employer] regarding Entresto re-application.  - Will outreach patient as scheduled.   Catie Darnelle Maffucci, PharmD, [place], CPP Clinical Pharmacist [place] [phone]

## 2019-07-21 NOTE — Telephone Encounter (Signed)
Patient calling to let us know pcp office is helping with assistance forms and to please call katie there to discuss forms .  [phone]

## 2019-07-21 NOTE — Telephone Encounter (Signed)
Shelly @ [facility] returned a call for pt. Please call her back @ [phone]. Thanks.

## 2019-07-27 ENCOUNTER — Ambulatory Visit: Payer: Self-pay | Admitting: Pharmacist

## 2019-07-27 ENCOUNTER — Other Ambulatory Visit: Payer: Self-pay | Admitting: Pharmacy Technician

## 2019-07-27 DIAGNOSIS — I25119 Atherosclerotic heart disease of native coronary artery with unspecified angina pectoris: Secondary | ICD-10-CM

## 2019-07-27 DIAGNOSIS — I255 Ischemic cardiomyopathy: Secondary | ICD-10-CM

## 2019-07-27 NOTE — Patient Outreach (Signed)
[facility]) Care Management  07/27/2019  AVARI SWANK [birth year] [id number]    Received both patient and provider portion(s) of patient assistance application(s) for [name]. Faxed completed application and required documents into [employer].  Will follow up with company(ies) in 2-3 weeks to check status of application(s).  Delaina Fetsch P. Genevieve Arbaugh, [place] Management [phone]

## 2019-07-27 NOTE — Patient Instructions (Signed)
Visit Information  Goals Addressed            This Visit's Progress     Patient Stated   . "I want to quit smoking" (pt-stated)       Current Barriers:  . Tobacco abuse of ~40 years; currently smoking 1 ppd; complicated by CAD, CHF, COPD o Previous quit attempts, successful ~ 1 year ago x 3 weeks; was previously able to quit smoking x 5 years; Using [name] w/ success before; has used patches, gum, lozenges;  o [name] PAP submitted today by Susy Frizzle, CPhT . CAD s/p prior stenting; ASA 81 mg daily, clopidogrel 75 mg daily; atorvastatin 40 mg daily, ezetimibe 10 mg daily; last LDL at goal <70 given significant CV hx; pharmacologic stress test planned  . HFrEF s/p AICD placement (most recent EF 25-30%); Entresto 97/103 mg BID, carvedilol 3.125 mg BID, spironolactone 25 mg daily, furosemide 80 mg daily, potassium 10 mEq daily o Working on [employer]; per call to [employer] on 07/21/2019, a copy of patient's Medicare A/B card was needed. Faxed in on 07/29/2019. Patient ID number is [id number] . Ventricular tachycardia: amiodarone 300 mg daily; . COPD: Dulera 200/5 mcg BID + Spiriva 81 mg daily. Proventil HFA PRN. Spiriva QAM, Ruthe Mannan PRN May be that LAMA monotherapy or LAMA/LABA would be most appropriate. Appt w/ Dr. Patsey Berthold in January. o Receives Dulera/Proventil through [employer] patient assistance (through 02/17/2020); Spiriva through [facility] patient assistance (through 07/29/2019). Notes he has a significant supply of all inhalers right now  Pharmacist Clinical Goal(s):  Marland Kitchen Over the next 90 days, patient will work with PharmD and provider towards tobacco cessation . Over the next 90 days, patient will work with multidisciplinary team on medication access.  Interventions: . Received call from patient; he noted that he received a letter that his insurance card was needed. I let him know that I went ahead and faxed that in last week.  Kandice Robinsons [employer] to follow up. They have not  processed this fax, but note that it is taking 3-5 business days for faxes to be processed right now. They recommend I call back next week. . They also noted that he is due for a refill, medication last filled in September. They will process the new prescription submitted w/ his [id number] reapplication and will contact him to set up shipping.   Patient Self Care Activities:  . Patient will commit to reducing tobacco consumption  Please see past updates related to this goal by clicking on the "Past Updates" button in the selected goal         The patient verbalized understanding of instructions provided today and declined a print copy of patient instruction materials.   Plan: - Will f/u with [employer] next week as recommended   Catie Darnelle Maffucci, PharmD, [place], [place] Pharmacist [place] [phone]

## 2019-07-27 NOTE — Chronic Care Management (AMB) (Signed)
Chronic Care Management   Follow Up Note   07/27/2019 Name: Ian Mills MRN: [id number] DOB: July 10, 1956  Referred by: McLean-Scocuzza, Nino Glow, MD Reason for referral : Chronic Care Management (Medication Management)   KROSBY VAIL is a 63 y.o. year old male who is a primary care patient of McLean-Scocuzza, Nino Glow, MD. The CCM team was consulted for assistance with chronic disease management and care coordination needs.  Care coordination completed today.     Review of patient status, including review of consultants reports, relevant laboratory and other test results, and collaboration with appropriate care team members and the patient's provider was performed as part of comprehensive patient evaluation and provision of chronic care management services.    SDOH (Social Determinants of Health) screening performed today: Financial Strain . See Care Plan for related entries.   Outpatient Encounter Medications as of 07/27/2019  Medication Sig Note   albuterol (VENTOLIN HFA) 108 (90 Base) MCG/ACT inhaler Inhale 1 puff into the lungs every 6 (six) hours as needed for wheezing or shortness of breath. 07/19/2019: Only needing QAM   ALPRAZolam (XANAX) 0.5 MG tablet Take 1 tablet (0.5 mg total) by mouth 3 (three) times daily as needed. for anxiety    amiodarone (PACERONE) 200 MG tablet Take 1.5 tablets (300 mg total) by mouth daily.    aspirin 81 MG EC tablet Take 1 tablet (81 mg total) by mouth daily.    atorvastatin (LIPITOR) 40 MG tablet Take 1 tablet (40 mg total) by mouth daily.    carvedilol (COREG) 3.125 MG tablet Take 1 tablet (3.125 mg total) by mouth 2 (two) times daily.    clopidogrel (PLAVIX) 75 MG tablet Take 1 tablet (75 mg total) by mouth daily.    ezetimibe (ZETIA) 10 MG tablet Take 1 tablet (10 mg total) by mouth daily.    furosemide (LASIX) 40 MG tablet Take 1 tablet (40 mg total) by mouth 2 (two) times daily. (Patient taking differently: Take 80 mg by mouth  daily. ) 05/03/2019: Takes 80 mg at once    ipratropium-albuterol (DUONEB) 0.5-2.5 (3) MG/3ML SOLN Take 3 mLs by nebulization every 6 (six) hours as needed. (Patient not taking: Reported on 07/19/2019)    mometasone-formoterol (DULERA) 200-5 MCG/ACT AERO Inhale 2 puffs into the lungs 2 (two) times daily. 07/19/2019: Taking irregularly    nitroGLYCERIN (NITROSTAT) 0.4 MG SL tablet Place 0.4 mg under the tongue every 5 (five) minutes as needed.     potassium chloride (KLOR-CON 10) 10 MEQ tablet Take 1 tablet (10 mEq total) by mouth daily.    sacubitril-valsartan (ENTRESTO) 97-103 MG Take 1 tablet by mouth 2 (two) times daily.    spironolactone (ALDACTONE) 25 MG tablet Take 1 tablet (25 mg total) by mouth daily.    tadalafil (CIALIS) 10 MG tablet Take 1 tablet (10 mg total) by mouth daily as needed for erectile dysfunction (take 30 min prior to when needed.).    tiotropium (SPIRIVA) 18 MCG inhalation capsule Place 18 mcg into inhaler and inhale daily. 07/19/2019: QAM   No facility-administered encounter medications on file as of 07/27/2019.     Goals Addressed            This Visit's Progress     Patient Stated    "I want to quit smoking" (pt-stated)       Current Barriers:   Tobacco abuse of ~40 years; currently smoking 1 ppd; complicated by CAD, CHF, COPD o Previous quit attempts, successful ~ 1 year ago  x 3 weeks; was previously able to quit smoking x 5 years; Using [name] w/ success before; has used patches, gum, lozenges;  o [name] PAP submitted today by Susy Frizzle, CPhT  CAD s/p prior stenting; ASA 81 mg daily, clopidogrel 75 mg daily; atorvastatin 40 mg daily, ezetimibe 10 mg daily; last LDL at goal <70 given significant CV hx; pharmacologic stress test planned   HFrEF s/p AICD placement (most recent EF 25-30%); Entresto 97/103 mg BID, carvedilol 3.125 mg BID, spironolactone 25 mg daily, furosemide 80 mg daily, potassium 10 mEq daily o Working on [employer];  per call to [employer] on 07/21/2019, a copy of patient's Medicare A/B card was needed. Faxed in on 07/29/2019. Patient ID number is [id number]  Ventricular tachycardia: amiodarone 300 mg daily;  COPD: Dulera 200/5 mcg BID + Spiriva 81 mg daily. Proventil HFA PRN. Spiriva QAM, Ruthe Mannan PRN May be that LAMA monotherapy or LAMA/LABA would be most appropriate. Appt w/ Dr. Patsey Berthold in January. o Receives Dulera/Proventil through [employer] patient assistance (through 02/17/2020); Spiriva through [facility] patient assistance (through 07/29/2019). Notes he has a significant supply of all inhalers right now  Pharmacist Clinical Goal(s):   Over the next 90 days, patient will work with PharmD and provider towards tobacco cessation  Over the next 90 days, patient will work with multidisciplinary team on medication access.  Interventions:  Received call from patient; he noted that he received a letter that his insurance card was needed. I let him know that I went ahead and faxed that in last week.   Contacted [employer] to follow up. They have not processed this fax, but note that it is taking 3-5 business days for faxes to be processed right now. They recommend I call back next week.  They also noted that he is due for a refill, medication last filled in September. They will process the new prescription submitted w/ his [id number] reapplication and will contact him to set up shipping.   Patient Self Care Activities:   Patient will commit to reducing tobacco consumption  Please see past updates related to this goal by clicking on the "Past Updates" button in the selected goal         Plan: - Will f/u with [employer] next week as recommended   Catie Darnelle Maffucci, PharmD, [place], [place] Pharmacist [facility] [phone]

## 2019-07-28 ENCOUNTER — Telehealth: Payer: Self-pay | Admitting: Internal Medicine

## 2019-07-28 ENCOUNTER — Other Ambulatory Visit: Payer: Self-pay | Admitting: Internal Medicine

## 2019-07-28 DIAGNOSIS — F419 Anxiety disorder, unspecified: Secondary | ICD-10-CM

## 2019-07-28 MED ORDER — ALPRAZOLAM 0.5 MG PO TABS: 0.5000 mg | ORAL_TABLET | Freq: Three times a day (TID) | ORAL | 2 refills | Status: DC | PRN

## 2019-07-28 NOTE — Telephone Encounter (Signed)
Patient needs the following medication refill ALPRAZolam (XANAX) 0.5 MG tablet, patient is out of his medication.

## 2019-08-02 ENCOUNTER — Other Ambulatory Visit: Payer: Self-pay | Admitting: Pharmacy Technician

## 2019-08-02 ENCOUNTER — Telehealth: Payer: Medicare Other

## 2019-08-02 ENCOUNTER — Ambulatory Visit: Payer: Self-pay | Admitting: Pharmacist

## 2019-08-02 ENCOUNTER — Telehealth: Payer: Self-pay

## 2019-08-02 DIAGNOSIS — J449 Chronic obstructive pulmonary disease, unspecified: Secondary | ICD-10-CM

## 2019-08-02 NOTE — Patient Instructions (Signed)
Visit Information  Goals Addressed            This Visit's Progress     Patient Stated   . "I want to quit smoking" (pt-stated)       Current Barriers:  . Tobacco abuse of ~40 years; currently smoking 1 ppd; complicated by CAD, CHF, COPD o Previous quit attempts, successful ~ 1 year ago x 3 weeks; was previously able to quit smoking x 5 years; Using [name] w/ success before; has used patches, gum, lozenges;  . CAD s/p prior stenting; ASA 81 mg daily, clopidogrel 75 mg daily; atorvastatin 40 mg daily, ezetimibe 10 mg daily; last LDL at goal <70 given significant CV hx; pharmacologic stress test planned  . HFrEF s/p AICD placement (most recent EF 25-30%); Entresto 97/103 mg BID, carvedilol 3.125 mg BID, spironolactone 25 mg daily, furosemide 80 mg daily, potassium 10 mEq daily o Working on [employer]; per call to [employer] on 07/21/2019, a copy of patient's Medicare A/B card was needed. Faxed in on 07/29/2019. Patient ID number is [id number] . Ventricular tachycardia: amiodarone 300 mg daily; . COPD: Dulera 200/5 mcg BID + Spiriva 81 mg daily. Proventil HFA PRN. Spiriva QAM, Ruthe Mannan PRN May be that LAMA monotherapy or LAMA/LABA would be most appropriate. Appt w/ Dr. Patsey Berthold in January.  o Receives Dulera/Proventil through [employer] patient assistance (through 02/17/2020); Spiriva through [facility] patient assistance (through 07/29/2019). Patient noted to [facility], CPhT today that he only has ~1 week of Spirva capsules remaining. Enrollment through [place] expired last week.   Pharmacist Clinical Goal(s):  Marland Kitchen Over the next 90 days, patient will work with PharmD and provider towards tobacco cessation . Over the next 90 days, patient will work with multidisciplinary team on medication access.  Interventions: . Collaborated w/ clinic staff- we do not have Spiriva or Incruse samples in the office. Darylene Price, CMA w/ Dr. Patsey Berthold, to see if pulmonary could supply patient with a  sample to bridge to upcoming appointment in 2 weeks. Margie is communicating w/ Dr. Patsey Berthold.   Patient Self Care Activities:  . Patient will commit to reducing tobacco consumption  Please see past updates related to this goal by clicking on the "Past Updates" button in the selected goal         The patient verbalized understanding of instructions provided today and declined a print copy of patient instruction materials.      Plan: - Will await message back from Dr. Beckey Downing regarding [place], PharmD, [place], [place] Pharmacist [place] [phone]

## 2019-08-02 NOTE — Telephone Encounter (Signed)
Respimat would be OK. The dose is 2.5 mcg, 2 puffs once daily.

## 2019-08-02 NOTE — Telephone Encounter (Signed)
DR. Patsey Berthold please see below message.  It appears that pt is currently taking Spiriva HandiHaler. We do have samples of respimat.   Please advise if okay to give sample and what dosage? Thanks

## 2019-08-02 NOTE — Patient Outreach (Signed)
[facility]) Care Management  08/02/2019  Ian Mills 17-Dec-1955 [id number]   Incoming call received from patient in regards to [place] with [place].  Spoke to patient, HIPAA identifiers verified.  Patient informed [facility] embedded pharmacist Catie Darnelle Maffucci is helping him obtain some medications thru patient assistance. He informed he mis spoke and does not have a lot of Spiriva left. He informed he has about 8 capsules left to use in the handihaler. He inquired if [facility] embedded pharmacist could help him obtain some more medication.  Will route note to [facility] embedded pharmacist Catie Darnelle Maffucci for assistance.  Elzia Hott P. Shana Younge, [place] Management [phone]

## 2019-08-02 NOTE — Chronic Care Management (AMB) (Signed)
Chronic Care Management   Follow Up Note   08/02/2019 Name: Ian Mills MRN: [id number] DOB: 10-12-1955  Referred by: McLean-Scocuzza, Nino Glow, MD Reason for referral : Chronic Care Management (Medication Management)   Ian Mills is a 64 y.o. year old male who is a primary care patient of McLean-Scocuzza, Nino Glow, MD. The CCM team was consulted for assistance with chronic disease management and care coordination needs.    Care coordination completed today.   Review of patient status, including review of consultants reports, relevant laboratory and other test results, and collaboration with appropriate care team members and the patient's provider was performed as part of comprehensive patient evaluation and provision of chronic care management services.    SDOH (Social Determinants of Health) screening performed today: Financial Strain . See Care Plan for related entries.   Outpatient Encounter Medications as of 08/02/2019  Medication Sig Note  . albuterol (VENTOLIN HFA) 108 (90 Base) MCG/ACT inhaler Inhale 1 puff into the lungs every 6 (six) hours as needed for wheezing or shortness of breath. 07/19/2019: Only needing QAM  . ALPRAZolam (XANAX) 0.5 MG tablet Take 1 tablet (0.5 mg total) by mouth 3 (three) times daily as needed. for anxiety   . amiodarone (PACERONE) 200 MG tablet Take 1.5 tablets (300 mg total) by mouth daily.   Marland Kitchen aspirin 81 MG EC tablet Take 1 tablet (81 mg total) by mouth daily.   Marland Kitchen atorvastatin (LIPITOR) 40 MG tablet Take 1 tablet (40 mg total) by mouth daily.   . carvedilol (COREG) 3.125 MG tablet Take 1 tablet (3.125 mg total) by mouth 2 (two) times daily.   . clopidogrel (PLAVIX) 75 MG tablet Take 1 tablet (75 mg total) by mouth daily.   Marland Kitchen ezetimibe (ZETIA) 10 MG tablet Take 1 tablet (10 mg total) by mouth daily.   . furosemide (LASIX) 40 MG tablet Take 1 tablet (40 mg total) by mouth 2 (two) times daily. (Patient taking differently: Take 80 mg by mouth daily. )  05/03/2019: Takes 80 mg at once   . ipratropium-albuterol (DUONEB) 0.5-2.5 (3) MG/3ML SOLN Take 3 mLs by nebulization every 6 (six) hours as needed. (Patient not taking: Reported on 07/19/2019)   . mometasone-formoterol (DULERA) 200-5 MCG/ACT AERO Inhale 2 puffs into the lungs 2 (two) times daily. 07/19/2019: Taking irregularly   . nitroGLYCERIN (NITROSTAT) 0.4 MG SL tablet Place 0.4 mg under the tongue every 5 (five) minutes as needed.    . potassium chloride (KLOR-CON 10) 10 MEQ tablet Take 1 tablet (10 mEq total) by mouth daily.   . sacubitril-valsartan (ENTRESTO) 97-103 MG Take 1 tablet by mouth 2 (two) times daily.   Marland Kitchen spironolactone (ALDACTONE) 25 MG tablet Take 1 tablet (25 mg total) by mouth daily.   . tadalafil (CIALIS) 10 MG tablet Take 1 tablet (10 mg total) by mouth daily as needed for erectile dysfunction (take 30 min prior to when needed.).   Marland Kitchen tiotropium (SPIRIVA) 18 MCG inhalation capsule Place 18 mcg into inhaler and inhale daily. 07/19/2019: QAM   No facility-administered encounter medications on file as of 08/02/2019.     Goals Addressed            This Visit's Progress     Patient Stated   . "I want to quit smoking" (pt-stated)       Current Barriers:  . Tobacco abuse of ~40 years; currently smoking 1 ppd; complicated by CAD, CHF, COPD o Previous quit attempts, successful ~ 1 year ago  x 3 weeks; was previously able to quit smoking x 5 years; Using [name] w/ success before; has used patches, gum, lozenges;  . CAD s/p prior stenting; ASA 81 mg daily, clopidogrel 75 mg daily; atorvastatin 40 mg daily, ezetimibe 10 mg daily; last LDL at goal <70 given significant CV hx; pharmacologic stress test planned  . HFrEF s/p AICD placement (most recent EF 25-30%); Entresto 97/103 mg BID, carvedilol 3.125 mg BID, spironolactone 25 mg daily, furosemide 80 mg daily, potassium 10 mEq daily o Working on [employer]; per call to [employer] on 07/21/2019, a copy of patient's  Medicare A/B card was needed. Faxed in on 07/29/2019. Patient ID number is [id number] . Ventricular tachycardia: amiodarone 300 mg daily; . COPD: Dulera 200/5 mcg BID + Spiriva 81 mg daily. Proventil HFA PRN. Spiriva QAM, Ruthe Mannan PRN May be that LAMA monotherapy or LAMA/LABA would be most appropriate. Appt w/ Dr. Patsey Berthold in January.  o Receives Dulera/Proventil through [employer] patient assistance (through 02/17/2020); Spiriva through [facility] patient assistance (through 07/29/2019). Patient noted to [facility], CPhT today that he only has ~1 week of Spirva capsules remaining. Enrollment through [place] expired last week.   Pharmacist Clinical Goal(s):  Marland Kitchen Over the next 90 days, patient will work with PharmD and provider towards tobacco cessation . Over the next 90 days, patient will work with multidisciplinary team on medication access.  Interventions: . Collaborated w/ clinic staff- we do not have Spiriva or Incruse samples in the office. Darylene Price, CMA w/ Dr. Patsey Berthold, to see if pulmonary could supply patient with a sample to bridge to upcoming appointment in 2 weeks. Margie is communicating w/ Dr. Patsey Berthold.   Patient Self Care Activities:  . Patient will commit to reducing tobacco consumption  Please see past updates related to this goal by clicking on the "Past Updates" button in the selected goal          Plan: - Will await message back from Dr. Beckey Downing regarding [place], PharmD, [place], [place] Pharmacist [place] [phone]

## 2019-08-02 NOTE — Telephone Encounter (Signed)
-----   Message from De Hollingshead, [facility] sent at 08/02/2019 12:33 PM EST ----- Lesleigh Noe,   This patient is establishing with Dr. Patsey Berthold later this month. He is currently receiving Spiriva through patient assistance, but is going to run out in the next week or so. He does not have prescription drug insurance, so likely is unable to pay for a month supply.   I checked with our lead RN at the PCP's office, and we do not have any samples of Spiriva, or Incruse. Would your office be able to get him a one time sample of any LAMA to bridge until he establishes with Dr. Patsey Berthold on the 14th and we determine our pharmacotherapy plan moving forward?  Thanks!  Catie Darnelle Maffucci, PharmD, [place], CPP Clinical Pharmacist [place] [phone]  ----- Message ----- From: Nanci Pina, RN Sent: 08/02/2019  12:12 PM EST To: De Hollingshead, [facility]  No Not either one I will contact some suppliers when I have time. Hopefully soon. Thanks ----- Message ----- From: De Hollingshead, [facility] Sent: 08/02/2019  10:22 AM EST To: Nanci Pina, RN  See note. Patient only has ~ 1 week of Spiriva. Do we have any samples of Spiriva, or Incruse? If not, I'll reach out to Dr. Domingo Dimes office to see if they could help to bridge to when he sees her in 2 weeks.  Catie ----- Message ----- From: Jason Fila, CPhT Sent: 08/02/2019  10:09 AM EST To: De Hollingshead, [place]

## 2019-08-03 MED ORDER — SPIRIVA RESPIMAT 2.5 MCG/ACT IN AERS: 2.0000 | INHALATION_SPRAY | Freq: Every day | RESPIRATORY_TRACT | 0 refills | Status: DC

## 2019-08-03 NOTE — Telephone Encounter (Signed)
Lm for pt

## 2019-08-03 NOTE — Telephone Encounter (Signed)
Spoke to pt and relay be recommendations. Pt stated he would come by this week to pick up sample of spiriva 2.5. One sample has been placed up front.  Nothing further is needed.

## 2019-08-03 NOTE — Telephone Encounter (Signed)
Pt returning call, cb# [phone]

## 2019-08-05 ENCOUNTER — Other Ambulatory Visit: Payer: Self-pay | Admitting: Pharmacy Technician

## 2019-08-05 NOTE — Telephone Encounter (Signed)
Pt picked up samples

## 2019-08-05 NOTE — Patient Outreach (Signed)
[facility]) Care Management  08/05/2019  EMERICK MATZINGER 1956/03/08 [id number]    Care coordination call placed to [place] in regards to [name] application.  Spoke to Gae Bon, Patient is APPROVED 07/28/2019-07/27/2020.  Gae Bon informed that the  patient would be getting the starting and the continuing pack. Orders were placed with the pharmacy on 07/28/2019. She nformed it can take up to 10 business days to arrive at the provider's office. No tracking information at this time and they are usually shipped UPS or Fed EX. The order id is [id number] and [id number]. Patient's next refill is due 10/04/2019.  Will followup with patient in 10-14 business days to confirmed medication was received. Banessa Mao P. Jessi Jessop, [place] Management [phone]

## 2019-08-06 ENCOUNTER — Ambulatory Visit: Payer: Self-pay | Admitting: Pharmacist

## 2019-08-06 DIAGNOSIS — J449 Chronic obstructive pulmonary disease, unspecified: Secondary | ICD-10-CM

## 2019-08-06 DIAGNOSIS — F17200 Nicotine dependence, unspecified, uncomplicated: Secondary | ICD-10-CM

## 2019-08-06 DIAGNOSIS — I25119 Atherosclerotic heart disease of native coronary artery with unspecified angina pectoris: Secondary | ICD-10-CM

## 2019-08-06 NOTE — Patient Instructions (Signed)
Visit Information  Goals Addressed            This Visit's Progress     Patient Stated   . "I want to quit smoking" (pt-stated)       Current Barriers:  . Tobacco abuse of ~40 years; currently smoking 1 ppd; complicated by CAD, CHF, COPD o APPROVED for [name] assistance through 07/27/2020. Starter pack and continuing pack should arrive to office in the next ~week per call to [employer] by [employer] . CAD s/p prior stenting; ASA 81 mg daily, clopidogrel 75 mg daily; atorvastatin 40 mg daily, ezetimibe 10 mg daily; last LDL at goal <70 given significant CV hx; pharmacologic stress test planned  . HFrEF s/p AICD placement (most recent EF 25-30%); Entresto 97/103 mg BID, carvedilol 3.125 mg BID, spironolactone 25 mg daily, furosemide 80 mg daily, potassium 10 mEq daily o Working on [employer]; per call to [employer] on 07/21/2019, a copy of patient's Medicare A/B card was needed. Faxed in on 07/29/2019. Patient ID number is [id number] . Ventricular tachycardia: amiodarone 300 mg daily; . COPD: Dulera 200/5 mcg BID + Spiriva 81 mg daily. Proventil HFA PRN. Spiriva QAM, Ruthe Mannan PRN May be that LAMA monotherapy or LAMA/LABA would be most appropriate. Appt w/ Dr. Patsey Berthold in January.  o Receives Dulera/Proventil through [employer] patient assistance (through 02/17/2020); Spiriva through [facility] patient assistance (through 07/29/2019).  Pharmacist Clinical Goal(s):  Marland Kitchen Over the next 90 days, patient will work with PharmD and provider towards tobacco cessation . Over the next 90 days, patient will work with multidisciplinary team on medication access.  Interventions: . Patient called to thank me for helping coordinate the Spiriva sample from [facility].  . Informed of his [name] approval and that medication will ship to the provider's office. Someone from the office will let him know when it is ready for pick up.  Patient Self Care Activities:  . Patient will commit to reducing tobacco  consumption  Please see past updates related to this goal by clicking on the "Past Updates" button in the selected goal         The patient verbalized understanding of instructions provided today and declined a print copy of patient instruction materials.   Plan: - Will outreach patient in ~2 weeks as previously scheduled  Catie Darnelle Maffucci, PharmD, [place], [place] Pharmacist [place] [phone]

## 2019-08-06 NOTE — Chronic Care Management (AMB) (Signed)
Chronic Care Management   Follow Up Note   08/06/2019 Name: Ian Mills MRN: [id number] DOB: [birth year]  Referred by: McLean-Scocuzza, Nino Glow, MD Reason for referral : Chronic Care Management (Medication Management)   Ian Mills is a 64 y.o. year old male who is a primary care patient of McLean-Scocuzza, Nino Glow, MD. The CCM team was consulted for assistance with chronic disease management and care coordination needs.    Received call from patient today.   Review of patient status, including review of consultants reports, relevant laboratory and other test results, and collaboration with appropriate care team members and the patient's provider was performed as part of comprehensive patient evaluation and provision of chronic care management services.    SDOH (Social Determinants of Health) screening performed today: Financial Strain  Tobacco Use. See Care Plan for related entries.   Outpatient Encounter Medications as of 08/06/2019  Medication Sig Note  . albuterol (VENTOLIN HFA) 108 (90 Base) MCG/ACT inhaler Inhale 1 puff into the lungs every 6 (six) hours as needed for wheezing or shortness of breath. 07/19/2019: Only needing QAM  . ALPRAZolam (XANAX) 0.5 MG tablet Take 1 tablet (0.5 mg total) by mouth 3 (three) times daily as needed. for anxiety   . amiodarone (PACERONE) 200 MG tablet Take 1.5 tablets (300 mg total) by mouth daily.   Marland Kitchen aspirin 81 MG EC tablet Take 1 tablet (81 mg total) by mouth daily.   Marland Kitchen atorvastatin (LIPITOR) 40 MG tablet Take 1 tablet (40 mg total) by mouth daily.   . carvedilol (COREG) 3.125 MG tablet Take 1 tablet (3.125 mg total) by mouth 2 (two) times daily.   . clopidogrel (PLAVIX) 75 MG tablet Take 1 tablet (75 mg total) by mouth daily.   Marland Kitchen ezetimibe (ZETIA) 10 MG tablet Take 1 tablet (10 mg total) by mouth daily.   . furosemide (LASIX) 40 MG tablet Take 1 tablet (40 mg total) by mouth 2 (two) times daily. (Patient taking differently: Take 80 mg by  mouth daily. ) 05/03/2019: Takes 80 mg at once   . ipratropium-albuterol (DUONEB) 0.5-2.5 (3) MG/3ML SOLN Take 3 mLs by nebulization every 6 (six) hours as needed. (Patient not taking: Reported on 07/19/2019)   . mometasone-formoterol (DULERA) 200-5 MCG/ACT AERO Inhale 2 puffs into the lungs 2 (two) times daily. 07/19/2019: Taking irregularly   . nitroGLYCERIN (NITROSTAT) 0.4 MG SL tablet Place 0.4 mg under the tongue every 5 (five) minutes as needed.    . potassium chloride (KLOR-CON 10) 10 MEQ tablet Take 1 tablet (10 mEq total) by mouth daily.   . sacubitril-valsartan (ENTRESTO) 97-103 MG Take 1 tablet by mouth 2 (two) times daily.   Marland Kitchen spironolactone (ALDACTONE) 25 MG tablet Take 1 tablet (25 mg total) by mouth daily.   . tadalafil (CIALIS) 10 MG tablet Take 1 tablet (10 mg total) by mouth daily as needed for erectile dysfunction (take 30 min prior to when needed.).   Marland Kitchen tiotropium (SPIRIVA) 18 MCG inhalation capsule Place 18 mcg into inhaler and inhale daily. 07/19/2019: QAM  . Tiotropium Bromide Monohydrate (SPIRIVA RESPIMAT) 2.5 MCG/ACT AERS Inhale 2 puffs into the lungs daily for 1 day.    No facility-administered encounter medications on file as of 08/06/2019.     Goals Addressed            This Visit's Progress     Patient Stated   . "I want to quit smoking" (pt-stated)       Current Barriers:  .  Tobacco abuse of ~40 years; currently smoking 1 ppd; complicated by CAD, CHF, COPD o APPROVED for [name] assistance through 07/27/2020. Starter pack and continuing pack should arrive to office in the next ~week per call to [employer] by [employer] . CAD s/p prior stenting; ASA 81 mg daily, clopidogrel 75 mg daily; atorvastatin 40 mg daily, ezetimibe 10 mg daily; last LDL at goal <70 given significant CV hx; pharmacologic stress test planned  . HFrEF s/p AICD placement (most recent EF 25-30%); Entresto 97/103 mg BID, carvedilol 3.125 mg BID, spironolactone 25 mg daily, furosemide 80 mg daily,  potassium 10 mEq daily o Working on [employer]; per call to [employer] on 07/21/2019, a copy of patient's Medicare A/B card was needed. Faxed in on 07/29/2019. Patient ID number is [id number] . Ventricular tachycardia: amiodarone 300 mg daily; . COPD: Dulera 200/5 mcg BID + Spiriva 81 mg daily. Proventil HFA PRN. Spiriva QAM, Ruthe Mannan PRN May be that LAMA monotherapy or LAMA/LABA would be most appropriate. Appt w/ Dr. Patsey Berthold in January.  o Receives Dulera/Proventil through [employer] patient assistance (through 02/17/2020); Spiriva through [facility] patient assistance (through 07/29/2019).  Pharmacist Clinical Goal(s):  Marland Kitchen Over the next 90 days, patient will work with PharmD and provider towards tobacco cessation . Over the next 90 days, patient will work with multidisciplinary team on medication access.  Interventions: . Patient called to thank me for helping coordinate the Spiriva sample from [facility].  . Informed of his [name] approval and that medication will ship to the provider's office. Someone from the office will let him know when it is ready for pick up.  Patient Self Care Activities:  . Patient will commit to reducing tobacco consumption  Please see past updates related to this goal by clicking on the "Past Updates" button in the selected goal          Plan: - Will outreach patient in ~2 weeks as previously scheduled  Catie Darnelle Maffucci, PharmD, [place], [place] Pharmacist [place] [phone]

## 2019-08-09 ENCOUNTER — Ambulatory Visit: Payer: Medicare Other | Admitting: Cardiovascular Disease

## 2019-08-10 ENCOUNTER — Encounter
Admission: RE | Admit: 2019-08-10 | Discharge: 2019-08-10 | Disposition: A | Discharge status: Home / Self Care | Payer: Medicare Other | Source: Ambulatory Visit | Attending: Nurse Practitioner | Admitting: Nurse Practitioner

## 2019-08-10 ENCOUNTER — Other Ambulatory Visit: Payer: Self-pay | Admitting: Pharmacy Technician

## 2019-08-10 ENCOUNTER — Other Ambulatory Visit: Payer: Self-pay

## 2019-08-10 ENCOUNTER — Telehealth: Payer: Self-pay

## 2019-08-10 ENCOUNTER — Telehealth: Payer: Self-pay | Admitting: Pulmonary Disease

## 2019-08-10 DIAGNOSIS — R0789 Other chest pain: Secondary | ICD-10-CM | POA: Diagnosis not present

## 2019-08-10 LAB — NM MYOCAR MULTI W/SPECT W/WALL MOTION / EF
Estimated workload: 1 METS
Exercise duration (min): 0 min
Exercise duration (sec): 0 s
LV dias vol: 290 mL (ref 62–150)
LV sys vol: 213 mL
MPHR: 155 {beats}/min
Peak HR: 63 {beats}/min
Percent HR: 40 %
Rest HR: 52 {beats}/min
TID: 1.12

## 2019-08-10 MED ORDER — REGADENOSON 0.4 MG/5ML IV SOLN
0.4000 mg | Freq: Once | INTRAVENOUS | Status: AC
  Administered 2019-08-10: 0.4 mg via INTRAVENOUS
  Filled 2019-08-10: qty 5

## 2019-08-10 MED ORDER — TECHNETIUM TC 99M TETROFOSMIN IV KIT
10.0000 | PACK | Freq: Once | INTRAVENOUS | Status: AC | PRN
  Administered 2019-08-10: 10.002 via INTRAVENOUS

## 2019-08-10 MED ORDER — TECHNETIUM TC 99M TETROFOSMIN IV KIT
30.8800 | PACK | Freq: Once | INTRAVENOUS | Status: AC | PRN
  Administered 2019-08-10: 30.88 via INTRAVENOUS

## 2019-08-10 NOTE — Telephone Encounter (Signed)
I do not see record of our office attempting to contact pt. Pt stated that he listened back to his messages and it appears that it was a automated call. Nothing further is needed.

## 2019-08-10 NOTE — Telephone Encounter (Signed)
Left message for patient to return call back.  

## 2019-08-10 NOTE — Telephone Encounter (Signed)
-----   Message from Delorise Jackson, MD sent at 08/09/2019 11:40 AM EST -----  ----- Message ----- From: De Hollingshead, [facility] Sent: 08/09/2019  11:26 AM EST To: Babs Bertin, CMA, #  Oh perfect!   Ian Mills, please make sure to log in a phone note and then log when patient has picked it up, per Kathy's work flow. This helps my pharmacy technician, Sharee Pimple, see when the medication has been delivered and picked up for her tracking purposes.   Thanks all! I have a call scheduled to him next week, will discuss this and f/u from his pulmonary appointment w/ Dr. Patsey Berthold.   Catie ----- Message ----- From: McLean-Scocuzza, Nino Glow, MD Sent: 08/09/2019  11:00 AM EST To: Babs Bertin, CMA, De Hollingshead, [facility]  3 boxes of chantix came in for patient  Ian Mills was catie informed of this and put in her box to log and distribute to patient I gave them to you with pts name on them and all together?   tms

## 2019-08-10 NOTE — Patient Outreach (Signed)
[facility]) Care Management  08/10/2019  AEON CAULLEY 03/09/1956 [id number]   Care coordination call placed to [employer] in regards to patient's application for Entresto.  Spoke to [place] who informed they just began process re enrollment applications on [id number]. She informed they received all the necessary info for re enrollment including the Medicare AB card that was faxed in on 07/21/2019. She advised to check back in 7-10 business days.  Will follow up with [employer] in 7-10 business days to inquire on status of application.  Shelitha Magley P. Kaiah Hosea, [place] Management [phone]

## 2019-08-10 NOTE — Telephone Encounter (Signed)
Pt thinks it may have been to do w/ the automated call to remind him of his appt, but just wanted to make sure.

## 2019-08-11 ENCOUNTER — Telehealth: Payer: Self-pay | Admitting: Cardiovascular Disease

## 2019-08-11 NOTE — Telephone Encounter (Signed)
Patient returning call to discuss results.

## 2019-08-11 NOTE — Telephone Encounter (Signed)
Reviewed results and recommendations with patients wife. She reports patient went off and left phone at home and would like for him to call back and review. Advised that would be fine and to just have him call back. She verbalized understanding of results and had no further questions at this time.

## 2019-08-11 NOTE — Telephone Encounter (Signed)
-----   Message from Theora Gianotti, NP sent at 08/11/2019 10:06 AM EST ----- Stress test is abnormal.  It showed [place] his prior areas or heart attack, which are to be expected.  There is some reduction of blood flow around the prior heart attack areas, which raises the question of new blockage.  Heart squeezing function is low, which is known. Lung nodules noted, and have been noted before - cont annual lung cancer screening CT's.   Keep f/u on 1/20 to discuss symptoms further.  If chest tightness has worsened, we would consider repeat catheterization.

## 2019-08-11 NOTE — Telephone Encounter (Signed)
Patient calling to check on status of stress test results Please call when available

## 2019-08-11 NOTE — Telephone Encounter (Signed)
Call to patient to discuss stress test results and POC. Pt verbalized understanding and had no further questions at this time. Agrees to seek urgent medical care for any new or worsening sx.   Confirmed upcoming follow up appt.   Advised pt to call for any further questions or concerns.

## 2019-08-12 ENCOUNTER — Telehealth: Payer: Self-pay

## 2019-08-12 ENCOUNTER — Other Ambulatory Visit: Payer: Self-pay

## 2019-08-12 ENCOUNTER — Encounter: Payer: Self-pay | Admitting: Pulmonary Disease

## 2019-08-12 ENCOUNTER — Ambulatory Visit (INDEPENDENT_AMBULATORY_CARE_PROVIDER_SITE_OTHER): Payer: Medicare Other | Admitting: Pulmonary Disease

## 2019-08-12 VITALS — BP 130/72 | HR 60 | Temp 97.2°F | Ht 68.0 in | Wt 207.2 lb

## 2019-08-12 DIAGNOSIS — F17210 Nicotine dependence, cigarettes, uncomplicated: Secondary | ICD-10-CM | POA: Diagnosis not present

## 2019-08-12 DIAGNOSIS — J449 Chronic obstructive pulmonary disease, unspecified: Secondary | ICD-10-CM | POA: Diagnosis not present

## 2019-08-12 DIAGNOSIS — R0902 Hypoxemia: Secondary | ICD-10-CM | POA: Diagnosis not present

## 2019-08-12 MED ORDER — STIOLTO RESPIMAT 2.5-2.5 MCG/ACT IN AERS: 2.0000 | INHALATION_SPRAY | Freq: Every day | RESPIRATORY_TRACT | 0 refills | Status: DC

## 2019-08-12 NOTE — Telephone Encounter (Signed)
Patient has picked up all three boxes of chantix.

## 2019-08-12 NOTE — Patient Instructions (Addendum)
1.  We performed an oxygen check while he walked today, you did well with this today.  2.  We will check your oxygen level at nighttime this will help [place] determine if you still need the oxygen at nighttime or not.  3.  We will give you a trial of Stiolto (this is Spiriva with an additional medication) we have shown you how to use the inhaler is the same mechanism as Spiriva.  If this works better for you we recommend that you get switched to this.  It is 2 puffs once a day.  DO NOT USE SPIRIVA WHILE USING STIOLTO.  4.  We will see him in follow-up in 2 months time.

## 2019-08-12 NOTE — Progress Notes (Signed)
Subjective:    Patient ID: Ian Mills, male    DOB: Jan 03, 1956, 64 y.o.   MRN: [id number]  HPI Patient is a 64 year old non-smoker (7 cigarettes/day) who presents for routine follow-up on the issue of COPD with chronic bronchitis.  He was previously followed by Dr. Ashby Dawes.  I am assuming care as Dr. Ashby Dawes has left the practice.  Patient has COPD with chronic bronchitis.  His last pulmonary function was performed in 2013 he has not been reassessed since then.  That time FEV1 was 3.05 L or 89% predicted with an FEV1/FVC of 69%.  This is consistent with mild airway obstruction.  That time lung volumes and DLCO were normal.  Since his last visit with Dr. Ashby Dawes on June 2020 he has had no new issue.  He had been on Spiriva HandiHaler however he has been switched to Spiriva Respimat as the company is phasing out the HandiHaler.  He received a new inhaler yesterday and is unsure of how to use it correctly.  Previously he had had issues with pneumonia and had required oxygen.  He has not used the oxygen since March 2020.  He would like to return the oxygen.  Patient has issues with coronary artery disease and ischemic cardiomyopathy and has a defibrillator in place.  He had a nuclear medicine stress test 2 days ago that is abnormal.  He will need cardiac catheterization.  His LVEF is noted to be less than 30%.  He had lung cancer screening imaging in June 2020 that showed no suspicious lesions.  Very mild emphysematous changes with the predominant finding of chronic bronchitic changes.  With regards to his dyspnea he is actually doing better.  He did have an episode of hypoxemic respiratory failure in February 2020 due to COPD exacerbation and pneumonia.  He has not noted issues since then with this.  He would like to return his oxygen however, he has not had reevaluation of this issue.  He has not had any fevers, chills or sweats.  No increased sputum production.  Cough has not change in  character mostly in the mornings.  No hemoptysis.  Does not describe any chest pain, paroxysmal nocturnal dyspnea or orthopnea.  Does not endorse any snoring.  No issues with sleeping.  Currently he has only using Spiriva and as noted above not sure if he is using it correctly and albuterol.  Started [name] yesterday.  Review of Systems A 10 point review of systems was performed and it is as noted above otherwise negative.    Objective:   Physical Exam  BP 130/72 (BP Location: Left Arm, Patient Position: Sitting, Cuff Size: Large)   Pulse 60   Temp (!) 97.2 F (36.2 C) (Temporal)   Ht 5\' 8"  (1.727 m)   Wt 207 lb 3.2 oz (94 kg)   SpO2 99% Comment: on ra  BMI 31.50 kg/m   General Appearance: Overweight (mostly truncal obesity), plethoric appearing Neuro:without focal findings,  speech normal, awake and alert HEENT: PERRLA, EOM intact.  No scleral icterus  Pulmonary: End expiratory wheezing mostly on the left, coarse breath sounds on the right CardiovascularNormal S1,S2.  No m/r/g.  Defibrillator in place  Abdomen: Protuberant, nondistended, soft  Back:  No costovertebral tenderness  Endo: No thyromegaly Skin:   warm, no rashes, no ecchymosis, plethoric appearance Extremities: normal, no cyanosis, clubbing.     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 Poorly compensated, trial of Stiolto 2 puffs  daily Patient was instructed on the proper use of the Respimat device He will withhold use of Spiriva while on Stiolto Provided a months worth of Stiolto, he is to contact us if this works better for him Will determine if he will need to switch from Spiriva to [employer] and contact medication assistance Follow-up in 2 months time he is to contact us prior to that time should any new difficulties arise  Hypoxia Patient had issues with hypoxia after an exacerbation in February 2020 No reassessment since then Ambulatory oximetry today showed no evidence of  hypoxia with ambulation Will perform overnight oximetry to ensure no need for oxygen nocturnally May discontinue oxygen equipment if ONOX  is satisfactory.  Tobacco dependence due to cigarettes Patient was counseled with regards to discontinuation of smoking total counseling time 3 to 5 minutes Started [name] yesterday  Total time of visit 45 minutes  C. Derrill Kay, MD [place] PCCM   *This note was dictated using voice recognition software/Dragon.  Despite best efforts to proofread, errors can occur which can change the meaning.  Any change was purely unintentional.

## 2019-08-13 ENCOUNTER — Telehealth: Payer: Self-pay | Admitting: *Deleted

## 2019-08-13 NOTE — Telephone Encounter (Signed)
Received fax from [place] that patient has been approved to receive Entresto until 08/11/2020 at no out-of-pocket cost as long as all program eligibility criteria continue to be met.

## 2019-08-16 ENCOUNTER — Other Ambulatory Visit: Payer: Self-pay | Admitting: Pharmacy Technician

## 2019-08-16 ENCOUNTER — Ambulatory Visit (INDEPENDENT_AMBULATORY_CARE_PROVIDER_SITE_OTHER): Payer: Medicare Other | Admitting: Pharmacist

## 2019-08-16 DIAGNOSIS — F17200 Nicotine dependence, unspecified, uncomplicated: Secondary | ICD-10-CM

## 2019-08-16 DIAGNOSIS — I5022 Chronic systolic (congestive) heart failure: Secondary | ICD-10-CM

## 2019-08-16 DIAGNOSIS — I25119 Atherosclerotic heart disease of native coronary artery with unspecified angina pectoris: Secondary | ICD-10-CM

## 2019-08-16 DIAGNOSIS — J449 Chronic obstructive pulmonary disease, unspecified: Secondary | ICD-10-CM

## 2019-08-16 NOTE — Patient Instructions (Signed)
Visit Information  Goals Addressed            This Visit's Progress     Patient Stated   . "I want to quit smoking" (pt-stated)       Current Barriers:  . Tobacco abuse of ~40 years; currently smoking 1/2 ppd; complicated by CAD, CHF, COPD o Today, notes he started [name] yesterday (08/15/2019). He is able to verbalize the starter pack taper (0.5 mg daily x 3 days, 0.5 mg BID x 4 days, then 1 mg BID thereafter) o When asked about setting a quit date, he notes he wants to quit "as soon as possible" o Wonders if he will still be able to take [name] if he is hospitalized for an elective cath o APPROVED for [name] assistance through 07/27/2020.  Marland Kitchen CAD s/p prior stenting; ASA 81 mg daily, clopidogrel 75 mg daily; atorvastatin 40 mg daily, ezetimibe 10 mg daily; last LDL at goal <70 given significant CV hx o S/p pharmacologic stress test. Has f/u w/ [place] later this week to discuss results and steps moving forward. . HFrEF s/p AICD placement (most recent EF 25-30%); Entresto 97/103 mg BID, carvedilol 3.125 mg BID, spironolactone 25 mg daily, furosemide 80 mg daily, potassium 10 mEq daily o Reports that he realized he has NOT been taking spironolactone. Unsure if he ever was o APPROVED for [place] assistance through 08/12/2019 . Ventricular tachycardia: amiodarone 300 mg daily; . COPD: Recently switched to Stiolto by Dr. Patsey Berthold. He notes that his breathing seems better on this; he has not needed albuterol HFA or nebulizer. Per notes, he is to just be on Stiolto, but patient reports today that he is still taking Dulera daily as well o Therapist, nutritional through [employer] patient assistance (through 02/17/2020); previously receiving Spiriva through [facility] patient assistance  Pharmacist Clinical Goal(s):  Marland Kitchen Over the next 90 days, patient will work with PharmD and provider towards tobacco cessation . Over the next 90 days, patient will work with multidisciplinary team on medication  access.  Interventions: . Comprehensive medication review performed, medication list updated in electronic medical record.  . Congratulated patient on starting [name] and commitment towards tobacco cessation. Counseled to take w/ food to avoid GI upset. Discussed that recommendation is to select a Quit Date 8-35 days after starting. Collaboratively chose August 30, 2019 as patient's quit date.  . Discussed that possibility of elective cath will be decided at appt w/ Christell Faith later this week. Discussed that I am unsure if [name] is on inpatient formulary, but he should discuss w/ his medical team and request that he bring his [name] supply from home to continue, if he is hospitalized for elective cath. Discussed benefit of continuing [name] for ~12 weeks for best efficacy.  . Will send message to Christell Faith noting that patient has NOT been on spironolactone, to discuss restarting at upcoming appointment . Will collaborate w/ Susy Frizzle, CPhT to mail patient his portion of Stiolto patient assistance application from [employer], and fax provider portion to Dr. Beckey Downing, [place]. Once all parts received, will fax to [facility].  Marland Kitchen Counseled patient to stop Dulera d/t overlap LABA w/ Stiolto. He verbalized understanding.   Patient Self Care Activities:  . Patient will commit to reducing tobacco consumption  Please see past updates related to this goal by clicking on the "Past Updates" button in the selected goal         The patient verbalized understanding of instructions provided today and declined a print  copy of patient instruction materials.   Plan: - Scheduled f/u call 09/02/19 to follow up on tobacco cessation  Catie Darnelle Maffucci, PharmD, [place], [place] Pharmacist [place] [phone]

## 2019-08-16 NOTE — Progress Notes (Signed)
Cardiology Office Note    Date:  08/18/2019   ID:  HAADI MERENDA, DOB [birth year], MRN [id number]  PCP:  McLean-Scocuzza, Nino Glow, MD  Cardiologist:  Ida Rogue, MD  Electrophysiologist:  Virl Axe, MD   Chief Complaint: Follow-up  History of Present Illness:   Ian Mills is a 64 y.o. male with history of CAD status post prior LAD, OM 3, RCA, and PDA stenting, HFrEF secondary to ICM status post AICD in 2013, VT, CKD stage II-III, HTN, HLD, and tobacco use who presents for follow-up of abnormal Myoview.  Patient underwent PCI to the LAD, OM 3, and PDA in 08/2006.  Subsequent cardiac cath in 06/2013 revealed severe proximal and mid RCA stenosis requiring PCI.  Echo in 01/2018 showed an EF of 30%.  He was last seen in 06/2019 and was doing reasonably well staying active without significant symptoms or limitations.  However, he did note an approximate 3 to 32-month history of episodic midsternal chest tightness and right shoulder discomfort about once every 2 weeks lasting up to 2 days and resolving spontaneously.  He noted that his prior anginal equivalent was chest tightness with left arm pain and nausea, with these symptoms not being present recently.  He noted his current chest tightness and right shoulder discomfort typically occurred in the setting of emotional stress and had not been exertional.  Despite the symptoms, he did not note any significant limitation with his functional status.  His EKG showed a prior anteroseptal infarct which was unchanged.  Given his symptoms, he underwent Lexiscan Myoview on 08/10/2019 which showed a large in size, moderate to severe, minimally reversible mid and apical anterior, anteroseptal, and anterolateral defect that was consistent with scar and possible mild peri-infarct ischemia.  There was also a moderate in size, moderate to severe, mid and apical fixed inferior/inferolateral defect consistent with scar.  LVEF was less than 30% with mid and apical  akinesis.  Aortic atherosclerosis, sequelae of prior coronary stenting in ICD were noted.  There were also some scattered groundglass and ill-defined nodular opacities in the bilateral lungs which could have reflected edema or atypical infection.  It was noted pulmonary nodules had previously been identified with the recommendation for the patient to continue lung cancer screening CTs with his PCP.  Overall, this was a high risk scan.  He comes in today feeling about the same as when he was last evaluated.  He continues to note intermittent substernal chest tightness that radiates to the right shoulder.  This is not exacerbated by any particular activity though is concerning to him.  He does note he was kayaking in 05/2019 and was very short of breath and fatigued following this.  At baseline, he is typically able to kayak without limitation or symptoms.  This was very concerning for him.  He does not feel like his symptoms are similar to how he was feeling leading up to his prior caths.  He also notes even though spironolactone has been on his medication list he has not taken this in many months.  Weight has been stable.  No lower extremity swelling, abdominal distention, orthopnea, PND, or early satiety.  No dizziness, presyncope, or syncope.  No falls, BRBPR, or melena.   Labs independently reviewed: 05/2019 - A1c 5.7, TSH normal, TC 142, TG 133, HDL 46, LDL 69, Hgb 13.8, PLT 164, potassium 3.8, BUN 19, serum creatinine 1.44, AST/ALT normal, albumin 4.1  Past Medical History:  Diagnosis Date  . Coronary artery  disease    a. 3 stents to the LAD, 1 to the PDA, and 1 to the OM3; b. cath 06/2013: chronically occluded LAD, patent stent LAD, D1 20%, pLCx 30%, mLCx 20%, pRCA 70% (FFR 0.74) s/p PCI/DES, mRCA 70% s/p PCI/DES, RPDA 50%, EF 20% w/ AK of anterolateral & apical walls, moderately elevated LVEDP  . HFrEF (heart failure with reduced ejection fraction) ([place])    a. 05/2014 Echo: EF 25-30%; b. 01/2018  Echo ([facility]): EF 30%.  Marland Kitchen HLD (hyperlipidemia)   . HTN (hypertension)   . ICD  single,BSX    a. DOI 11/2011; b. S/N# [id number]  . Ischemic cardiomyopathy    a. 05/2014 Echo: EF 25-30%; b. 01/2018 Echo ([facility]): EF 30%, sev glob HK w/ apical/ant AK. Mild LVH. Mod LAE, mild RAE. Triv MR/TR.  Marland Kitchen Pneumonia    08/2018  . Syncope and collapse   . Ventricular tachycardia ([place]) 11/2011   a. s/p AICD implant     Past Surgical History:  Procedure Laterality Date  . appendectomy    . CARDIAC CATHETERIZATION  Nov 30 2011   [facility]  . CARDIAC DEFIBRILLATOR PLACEMENT  May 2013  . CORONARY ANGIOPLASTY  2014   x 2 stents  . CORONARY STENT PLACEMENT     x7  . PROSTATE SURGERY     urethra grew around prostate   . TUMOR REMOVAL  2001   chest thymic cyst; behind lungs [facility] benign     Current Medications: Current Meds  Medication Sig  . albuterol (VENTOLIN HFA) 108 (90 Base) MCG/ACT inhaler Inhale 1 puff into the lungs every 6 (six) hours as needed for wheezing or shortness of breath.  . ALPRAZolam (XANAX) 0.5 MG tablet Take 1 tablet (0.5 mg total) by mouth 3 (three) times daily as needed. for anxiety  . amiodarone (PACERONE) 200 MG tablet Take 1.5 tablets (300 mg total) by mouth daily.  Marland Kitchen aspirin 81 MG EC tablet Take 1 tablet (81 mg total) by mouth daily.  Marland Kitchen atorvastatin (LIPITOR) 40 MG tablet Take 1 tablet (40 mg total) by mouth daily.  . carvedilol (COREG) 3.125 MG tablet Take 1 tablet (3.125 mg total) by mouth 2 (two) times daily.  . clopidogrel (PLAVIX) 75 MG tablet Take 1 tablet (75 mg total) by mouth daily.  Marland Kitchen ezetimibe (ZETIA) 10 MG tablet Take 1 tablet (10 mg total) by mouth daily.  . furosemide (LASIX) 40 MG tablet Take 1 tablet (40 mg total) by mouth 2 (two) times daily. (Patient taking differently: Take 80 mg by mouth daily. )  . ipratropium-albuterol (DUONEB) 0.5-2.5 (3) MG/3ML SOLN Take 3 mLs by nebulization every 6 (six) hours as needed.  . mometasone-formoterol (DULERA) 200-5 MCG/ACT AERO  Inhale 2 puffs into the lungs 2 (two) times daily.  . nitroGLYCERIN (NITROSTAT) 0.4 MG SL tablet Place 0.4 mg under the tongue every 5 (five) minutes as needed.   . potassium chloride (KLOR-CON 10) 10 MEQ tablet Take 1 tablet (10 mEq total) by mouth daily.  . sacubitril-valsartan (ENTRESTO) 97-103 MG Take 1 tablet by mouth 2 (two) times daily.  . tadalafil (CIALIS) 10 MG tablet Take 1 tablet (10 mg total) by mouth daily as needed for erectile dysfunction (take 30 min prior to when needed.).  Marland Kitchen Tiotropium Bromide-Olodaterol (STIOLTO RESPIMAT) 2.5-2.5 MCG/ACT AERS Inhale 2 puffs into the lungs daily.  . varenicline ([name] STARTING MONTH PAK) 0.5 MG X 11 & 1 MG X 42 tablet Take by mouth 2 (two) times daily. Take one 0.5  mg tablet by mouth once daily for 3 days, then increase to one 0.5 mg tablet twice daily for 4 days, then increase to one 1 mg tablet twice daily.    Allergies:   Nitroglycerin   Social History   Socioeconomic History  . Marital status: Married    Spouse name: Not on file  . Number of children: Not on file  . Years of education: Not on file  . Highest education level: Not on file  Occupational History  . Not on file  Tobacco Use  . Smoking status: Current Some Day Smoker    Packs/day: 0.25    Years: 42.00    Pack years: 10.50    Types: Cigarettes    Last attempt to quit: 09/03/2018    Years since quitting: 0.9  . Smokeless tobacco: Never Used  . Tobacco comment: smoking 7 a day-08/12/2019  Substance and Sexual Activity  . Alcohol use: Yes    Alcohol/week: 1.0 standard drinks    Types: 1 Cans of beer per week    Comment: per week  . Drug use: No  . Sexual activity: Not on file  Other Topics Concern  . 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    Social Determinants of Health    Financial Resource Strain: Low Risk   . Difficulty of Paying Living Expenses: Not hard at all  Food Insecurity: No Food Insecurity  . Worried About Charity fundraiser in the Last Year: Never true  . Ran Out of Food in the Last Year: Never true  Transportation Needs: No Transportation Needs  . Lack of Transportation (Medical): No  . Lack of Transportation (Non-Medical): No  Physical Activity:   . Days of Exercise per Week: Not on file  . Minutes of Exercise per Session: Not on file  Stress: No Stress Concern Present  . Feeling of Stress : Not at all  Social Connections:   . Frequency of Communication with Friends and Family: Not on file  . Frequency of Social Gatherings with Friends and Family: Not on file  . Attends Religious Services: Not on file  . Active Member of Clubs or Organizations: Not on file  . Attends Archivist Meetings: Not on file  . Marital Status: Not on file     Family History:  The patient's family history includes Cancer in his father; Cancer - Lung in his mother; Dementia in his maternal grandmother; Heart attack in his mother and paternal grandfather; Hypertension in his mother.  ROS:   Review of Systems  Constitutional: Positive for malaise/fatigue. Negative for chills, diaphoresis, fever and weight loss.  HENT: Negative for congestion.   Eyes: Negative for discharge and redness.  Respiratory: Positive for shortness of breath. Negative for cough, hemoptysis, sputum production and wheezing.   Cardiovascular: Positive for chest pain. Negative for palpitations, orthopnea, claudication, leg swelling and PND.  Gastrointestinal: Negative for abdominal pain, blood in stool, heartburn, melena, nausea and vomiting.  Genitourinary: Negative for hematuria.  Musculoskeletal: Positive for joint pain. Negative for falls and myalgias.  Skin: Negative for rash.  Neurological: Positive for weakness. Negative for dizziness, tingling, tremors, sensory change,  speech change, focal weakness and loss of consciousness.  Endo/Heme/Allergies: Does  not bruise/bleed easily.  Psychiatric/Behavioral: Negative for substance abuse. The patient is not nervous/anxious.   All other systems reviewed and are negative.    EKGs/Labs/Other Studies Reviewed:    Studies reviewed were summarized above. The additional studies were reviewed today:  2D echo 05/2014: EF 25 to 30%, akinesis of the mid-distal anterior, apical, and distal inferior walls, moderate LVH, diastolic dysfunction, mildly to moderately increased LV internal cavity size, moderately dilated left atrium, mildly dilated right atrium, mild mitral regurgitation, mild aortic valve sclerosis without stenosis.  EKG:  EKG is ordered today.  The EKG ordered today demonstrates NSR, 64 bpm, left axis deviation, low voltage QRS, prior anteroseptal infarct, nonspecific inferolateral ST-T changes, no significant change when compared to prior  Recent Labs: 09/08/2018: Magnesium 2.2 06/10/2019: ALT 29; BUN 19; Creatinine, Ser 1.44; Hemoglobin 13.8; Platelets 164.0; Potassium 3.8; Sodium 138; TSH 1.94  Recent Lipid Panel    Component Value Date/Time   CHOL 142 06/10/2019 0955   CHOL 170 12/09/2016 1103   CHOL 111 01/20/2014 0929   TRIG 133.0 06/10/2019 0955   TRIG 88 01/20/2014 0929   HDL 46.40 06/10/2019 0955   HDL 53 12/09/2016 1103   HDL 39 (L) 01/20/2014 0929   CHOLHDL 3 06/10/2019 0955   VLDL 26.6 06/10/2019 0955   VLDL 18 01/20/2014 0929   LDLCALC 69 06/10/2019 0955   LDLCALC 94 12/09/2016 1103   LDLCALC 54 01/20/2014 0929    PHYSICAL EXAM:    VS:  BP 110/60 (BP Location: Left Arm, Patient Position: Sitting, Cuff Size: Normal)   Pulse 64   Ht 5\' 8"  (1.727 m)   Wt 203 lb (92.1 kg)   SpO2 98%   BMI 30.87 kg/m   BMI: Body mass index is 30.87 kg/m.  Physical Exam  Constitutional: He is oriented to person, place, and time. He appears well-developed and well-nourished.  HENT:  Head:  Normocephalic and atraumatic.  Eyes: Right eye exhibits no discharge. Left eye exhibits no discharge.  Neck: No JVD present.  Cardiovascular: Normal rate, regular rhythm, S1 normal, S2 normal and normal heart sounds. Exam reveals no distant heart sounds, no friction rub, no midsystolic click and no opening snap.  No murmur heard. Pulses:      Posterior tibial pulses are 2+ on the right side and 2+ on the left side.  Pulmonary/Chest: Effort normal and breath sounds normal. No respiratory distress. He has no decreased breath sounds. He has no wheezes. He has no rales. He exhibits no tenderness.  Abdominal: Soft. He exhibits no distension. There is no abdominal tenderness.  Musculoskeletal:        General: No edema.     Cervical back: Normal range of motion.  Neurological: He is alert and oriented to person, place, and time.  Skin: Skin is warm and dry. No cyanosis. Nails show no clubbing.  Psychiatric: He has a normal mood and affect. His speech is normal and behavior is normal. Judgment and thought content normal.    Wt Readings from Last 3 Encounters:  08/18/19 203 lb (92.1 kg)  08/12/19 207 lb 3.2 oz (94 kg)  06/30/19 204 lb (92.5 kg)     ASSESSMENT & PLAN:   1. CAD involving the native coronary arteries with accelerating angina and abnormal Lexiscan Myoview: Currently chest pain-free.  Patient has been dealing with substernal chest tightness with right shoulder discomfort over the past couple of months along with an episode of significant dyspnea and fatigue following a kayaking trip which was  unusual for him.  In this setting, he recently underwent [place] which was high risk.  Given ongoing symptoms and high risk Myoview we have elected to proceed with diagnostic cardiac cath at this time for definitive evaluation.  He will continue aspirin, Plavix, carvedilol, atorvastatin, Zetia, and Entresto.  Risks and benefits of cardiac catheterization have been discussed with the patient  including risks of bleeding, bruising, infection, kidney damage, stroke, heart attack, urgent need for bypass, injury to a limb, and death. The patient understands these risks and is willing to proceed with the procedure. All questions have been answered and concerns listened to.   2. HFrEF secondary to ICM: He is euvolemic and well compensated.  He does indicate he has not been taking spironolactone for the past several months.  Given his underlying CKD, and in the setting of needing diagnostic LHC, we have agreed to postpone the resumption of his medication at this time with plans to resume this following his diagnostic cath as renal function allows.  Otherwise, he will continue carvedilol, Entresto, and furosemide.  CHF education discussed.  3. History of VT: Status post ICD.  He denies any shocks since implantation.  Continue Coreg and amiodarone.  Recent LFT and TSH normal.  4. HTN: Blood pressure is well controlled today.  Continue current therapy as outlined above.  5. HLD: LDL of 69 from 05/2019 with normal liver function.  He remains on atorvastatin and Zetia.  6. Tobacco use: He continues to smoke though has cut back.  Complete cessation is recommended.  7. CKD stage II-III: Stable on most recent check in 05/2019.  Hold spironolactone as outlined above.  Check BMP precath.  Disposition: F/u with Dr. Rockey Situ or an APP 1 week after diagnostic cath.   Medication Adjustments/Labs and Tests Ordered: Current medicines are reviewed at length with the patient today.  Concerns regarding medicines are outlined above. Medication changes, Labs and Tests ordered today are summarized above and listed in the Patient Instructions accessible in Encounters.   Signed, Christell Faith, PA-C 08/18/2019 11:10 AM     [facility] [place] [address], [place] [address] [phone]

## 2019-08-16 NOTE — Patient Outreach (Signed)
[facility]) Care Management  08/16/2019  Ian Mills July 08, 1956 [id number]   Care coordination encounters received in regards to patient's application with [employer] for [employer] and [employer] for [employer].  The following encounter was received concerning patient's Pfizer application: [name]    08/12/19 10:02 AM Note   Patient has picked up all three boxes of chantix.     Will route note to embedded [facility] RPh Catie Darnelle Maffucci to update patient that he must call her in order to place his refill for the [name] approximately 2 weeks before he runs out of medication.   Also received care coordination notification concerning patient's Novartis application as follows: [name], RN    08/13/19 11:01 AM Note   Received fax from [facility] that patient has been approved to receive Entresto until 08/11/2020 at no out-of-pocket cost as long as all program eligibility criteria continue to be met.       Will route note to embedded [facility] RPh Catie Darnelle Maffucci that patient will have to call [employer] to place his refills for the [facility] as not aware of his last fill date with them.  Will remove myself from care team as well since patient assistance has been completed.  Nakisha Chai P. Jawad Wiacek, [facility] [phone]

## 2019-08-16 NOTE — Patient Outreach (Signed)
[facility]) Care Management  08/16/2019  ANATOLIY STOCKERT 1956/07/28 [id number]                                        Medication Assistance Referral  Referral From: Maria Antonia RPh Catie T.   Medication/Company: Beau Fanny / [employer] Patient application portion:  Mailed Provider application portion: Faxed  to Dr. Vernard Gambles Provider address/fax verified via: Office website     Follow up:  Will follow up with patient in 5-10 business days to confirm application(s) have been received.  Aleera Gilcrease P. Amzie Sillas, [facility] [phone]

## 2019-08-16 NOTE — H&P (View-Only) (Signed)
Cardiology Office Note    Date:  08/18/2019   ID:  Ian Mills, DOB 23-Feb-1956, MRN [id number]  PCP:  McLean-Scocuzza, Nino Glow, MD  Cardiologist:  Ida Rogue, MD  Electrophysiologist:  Virl Axe, MD   Chief Complaint: Follow-up  History of Present Illness:   Ian Mills is a 64 y.o. male with history of CAD status post prior LAD, OM 3, RCA, and PDA stenting, HFrEF secondary to ICM status post AICD in 2013, VT, CKD stage II-III, HTN, HLD, and tobacco use who presents for follow-up of abnormal Myoview.  Patient underwent PCI to the LAD, OM 3, and PDA in 08/2006.  Subsequent cardiac cath in 06/2013 revealed severe proximal and mid RCA stenosis requiring PCI.  Echo in 01/2018 showed an EF of 30%.  He was last seen in 06/2019 and was doing reasonably well staying active without significant symptoms or limitations.  However, he did note an approximate 3 to 22-month history of episodic midsternal chest tightness and right shoulder discomfort about once every 2 weeks lasting up to 2 days and resolving spontaneously.  He noted that his prior anginal equivalent was chest tightness with left arm pain and nausea, with these symptoms not being present recently.  He noted his current chest tightness and right shoulder discomfort typically occurred in the setting of emotional stress and had not been exertional.  Despite the symptoms, he did not note any significant limitation with his functional status.  His EKG showed a prior anteroseptal infarct which was unchanged.  Given his symptoms, he underwent Lexiscan Myoview on 08/10/2019 which showed a large in size, moderate to severe, minimally reversible mid and apical anterior, anteroseptal, and anterolateral defect that was consistent with scar and possible mild peri-infarct ischemia.  There was also a moderate in size, moderate to severe, mid and apical fixed inferior/inferolateral defect consistent with scar.  LVEF was less than 30% with mid and apical  akinesis.  Aortic atherosclerosis, sequelae of prior coronary stenting in ICD were noted.  There were also some scattered groundglass and ill-defined nodular opacities in the bilateral lungs which could have reflected edema or atypical infection.  It was noted pulmonary nodules had previously been identified with the recommendation for the patient to continue lung cancer screening CTs with his PCP.  Overall, this was a high risk scan.  He comes in today feeling about the same as when he was last evaluated.  He continues to note intermittent substernal chest tightness that radiates to the right shoulder.  This is not exacerbated by any particular activity though is concerning to him.  He does note he was kayaking in 05/2019 and was very short of breath and fatigued following this.  At baseline, he is typically able to kayak without limitation or symptoms.  This was very concerning for him.  He does not feel like his symptoms are similar to how he was feeling leading up to his prior caths.  He also notes even though spironolactone has been on his medication list he has not taken this in many months.  Weight has been stable.  No lower extremity swelling, abdominal distention, orthopnea, PND, or early satiety.  No dizziness, presyncope, or syncope.  No falls, BRBPR, or melena.   Labs independently reviewed: 05/2019 - A1c 5.7, TSH normal, TC 142, TG 133, HDL 46, LDL 69, Hgb 13.8, PLT 164, potassium 3.8, BUN 19, serum creatinine 1.44, AST/ALT normal, albumin 4.1  Past Medical History:  Diagnosis Date  . Coronary artery  disease    a. 3 stents to the LAD, 1 to the PDA, and 1 to the OM3; b. cath 06/2013: chronically occluded LAD, patent stent LAD, D1 20%, pLCx 30%, mLCx 20%, pRCA 70% (FFR 0.74) s/p PCI/DES, mRCA 70% s/p PCI/DES, RPDA 50%, EF 20% w/ AK of anterolateral & apical walls, moderately elevated LVEDP  . HFrEF (heart failure with reduced ejection fraction) ([place])    a. 05/2014 Echo: EF 25-30%; b. 01/2018  Echo ([facility]): EF 30%.  Marland Kitchen HLD (hyperlipidemia)   . HTN (hypertension)   . ICD  single,BSX    a. DOI 11/2011; b. S/N# [id number]  . Ischemic cardiomyopathy    a. 05/2014 Echo: EF 25-30%; b. 01/2018 Echo ([facility]): EF 30%, sev glob HK w/ apical/ant AK. Mild LVH. Mod LAE, mild RAE. Triv MR/TR.  Marland Kitchen Pneumonia    08/2018  . Syncope and collapse   . Ventricular tachycardia ([place]) 11/2011   a. s/p AICD implant     Past Surgical History:  Procedure Laterality Date  . appendectomy    . CARDIAC CATHETERIZATION  Nov 30 2011   [facility]  . CARDIAC DEFIBRILLATOR PLACEMENT  May 2013  . CORONARY ANGIOPLASTY  2014   x 2 stents  . CORONARY STENT PLACEMENT     x7  . PROSTATE SURGERY     urethra grew around prostate   . TUMOR REMOVAL  2001   chest thymic cyst; behind lungs [facility] benign     Current Medications: Current Meds  Medication Sig  . albuterol (VENTOLIN HFA) 108 (90 Base) MCG/ACT inhaler Inhale 1 puff into the lungs every 6 (six) hours as needed for wheezing or shortness of breath.  . ALPRAZolam (XANAX) 0.5 MG tablet Take 1 tablet (0.5 mg total) by mouth 3 (three) times daily as needed. for anxiety  . amiodarone (PACERONE) 200 MG tablet Take 1.5 tablets (300 mg total) by mouth daily.  Marland Kitchen aspirin 81 MG EC tablet Take 1 tablet (81 mg total) by mouth daily.  Marland Kitchen atorvastatin (LIPITOR) 40 MG tablet Take 1 tablet (40 mg total) by mouth daily.  . carvedilol (COREG) 3.125 MG tablet Take 1 tablet (3.125 mg total) by mouth 2 (two) times daily.  . clopidogrel (PLAVIX) 75 MG tablet Take 1 tablet (75 mg total) by mouth daily.  Marland Kitchen ezetimibe (ZETIA) 10 MG tablet Take 1 tablet (10 mg total) by mouth daily.  . furosemide (LASIX) 40 MG tablet Take 1 tablet (40 mg total) by mouth 2 (two) times daily. (Patient taking differently: Take 80 mg by mouth daily. )  . ipratropium-albuterol (DUONEB) 0.5-2.5 (3) MG/3ML SOLN Take 3 mLs by nebulization every 6 (six) hours as needed.  . mometasone-formoterol (DULERA) 200-5 MCG/ACT AERO  Inhale 2 puffs into the lungs 2 (two) times daily.  . nitroGLYCERIN (NITROSTAT) 0.4 MG SL tablet Place 0.4 mg under the tongue every 5 (five) minutes as needed.   . potassium chloride (KLOR-CON 10) 10 MEQ tablet Take 1 tablet (10 mEq total) by mouth daily.  . sacubitril-valsartan (ENTRESTO) 97-103 MG Take 1 tablet by mouth 2 (two) times daily.  . tadalafil (CIALIS) 10 MG tablet Take 1 tablet (10 mg total) by mouth daily as needed for erectile dysfunction (take 30 min prior to when needed.).  Marland Kitchen Tiotropium Bromide-Olodaterol (STIOLTO RESPIMAT) 2.5-2.5 MCG/ACT AERS Inhale 2 puffs into the lungs daily.  . varenicline ([name] STARTING MONTH PAK) 0.5 MG X 11 & 1 MG X 42 tablet Take by mouth 2 (two) times daily. Take one 0.5  mg tablet by mouth once daily for 3 days, then increase to one 0.5 mg tablet twice daily for 4 days, then increase to one 1 mg tablet twice daily.    Allergies:   Nitroglycerin   Social History   Socioeconomic History  . Marital status: Married    Spouse name: Not on file  . Number of children: Not on file  . Years of education: Not on file  . Highest education level: Not on file  Occupational History  . Not on file  Tobacco Use  . Smoking status: Current Some Day Smoker    Packs/day: 0.25    Years: 42.00    Pack years: 10.50    Types: Cigarettes    Last attempt to quit: 09/03/2018    Years since quitting: 0.9  . Smokeless tobacco: Never Used  . Tobacco comment: smoking 7 a day-08/12/2019  Substance and Sexual Activity  . Alcohol use: Yes    Alcohol/week: 1.0 standard drinks    Types: 1 Cans of beer per week    Comment: per week  . Drug use: No  . Sexual activity: Not on file  Other Topics Concern  . 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    Social Determinants of Health    Financial Resource Strain: Low Risk   . Difficulty of Paying Living Expenses: Not hard at all  Food Insecurity: No Food Insecurity  . Worried About Charity fundraiser in the Last Year: Never true  . Ran Out of Food in the Last Year: Never true  Transportation Needs: No Transportation Needs  . Lack of Transportation (Medical): No  . Lack of Transportation (Non-Medical): No  Physical Activity:   . Days of Exercise per Week: Not on file  . Minutes of Exercise per Session: Not on file  Stress: No Stress Concern Present  . Feeling of Stress : Not at all  Social Connections:   . Frequency of Communication with Friends and Family: Not on file  . Frequency of Social Gatherings with Friends and Family: Not on file  . Attends Religious Services: Not on file  . Active Member of Clubs or Organizations: Not on file  . Attends Archivist Meetings: Not on file  . Marital Status: Not on file     Family History:  The patient's family history includes Cancer in his father; Cancer - Lung in his mother; Dementia in his maternal grandmother; Heart attack in his mother and paternal grandfather; Hypertension in his mother.  ROS:   Review of Systems  Constitutional: Positive for malaise/fatigue. Negative for chills, diaphoresis, fever and weight loss.  HENT: Negative for congestion.   Eyes: Negative for discharge and redness.  Respiratory: Positive for shortness of breath. Negative for cough, hemoptysis, sputum production and wheezing.   Cardiovascular: Positive for chest pain. Negative for palpitations, orthopnea, claudication, leg swelling and PND.  Gastrointestinal: Negative for abdominal pain, blood in stool, heartburn, melena, nausea and vomiting.  Genitourinary: Negative for hematuria.  Musculoskeletal: Positive for joint pain. Negative for falls and myalgias.  Skin: Negative for rash.  Neurological: Positive for weakness. Negative for dizziness, tingling, tremors, sensory change,  speech change, focal weakness and loss of consciousness.  Endo/Heme/Allergies: Does  not bruise/bleed easily.  Psychiatric/Behavioral: Negative for substance abuse. The patient is not nervous/anxious.   All other systems reviewed and are negative.    EKGs/Labs/Other Studies Reviewed:    Studies reviewed were summarized above. The additional studies were reviewed today:  2D echo 05/2014: EF 25 to 30%, akinesis of the mid-distal anterior, apical, and distal inferior walls, moderate LVH, diastolic dysfunction, mildly to moderately increased LV internal cavity size, moderately dilated left atrium, mildly dilated right atrium, mild mitral regurgitation, mild aortic valve sclerosis without stenosis.  EKG:  EKG is ordered today.  The EKG ordered today demonstrates NSR, 64 bpm, left axis deviation, low voltage QRS, prior anteroseptal infarct, nonspecific inferolateral ST-T changes, no significant change when compared to prior  Recent Labs: 09/08/2018: Magnesium 2.2 06/10/2019: ALT 29; BUN 19; Creatinine, Ser 1.44; Hemoglobin 13.8; Platelets 164.0; Potassium 3.8; Sodium 138; TSH 1.94  Recent Lipid Panel    Component Value Date/Time   CHOL 142 06/10/2019 0955   CHOL 170 12/09/2016 1103   CHOL 111 01/20/2014 0929   TRIG 133.0 06/10/2019 0955   TRIG 88 01/20/2014 0929   HDL 46.40 06/10/2019 0955   HDL 53 12/09/2016 1103   HDL 39 (L) 01/20/2014 0929   CHOLHDL 3 06/10/2019 0955   VLDL 26.6 06/10/2019 0955   VLDL 18 01/20/2014 0929   LDLCALC 69 06/10/2019 0955   LDLCALC 94 12/09/2016 1103   LDLCALC 54 01/20/2014 0929    PHYSICAL EXAM:    VS:  BP 110/60 (BP Location: Left Arm, Patient Position: Sitting, Cuff Size: Normal)   Pulse 64   Ht 5\' 8"  (1.727 m)   Wt 203 lb (92.1 kg)   SpO2 98%   BMI 30.87 kg/m   BMI: Body mass index is 30.87 kg/m.  Physical Exam  Constitutional: He is oriented to person, place, and time. He appears well-developed and well-nourished.  HENT:  Head:  Normocephalic and atraumatic.  Eyes: Right eye exhibits no discharge. Left eye exhibits no discharge.  Neck: No JVD present.  Cardiovascular: Normal rate, regular rhythm, S1 normal, S2 normal and normal heart sounds. Exam reveals no distant heart sounds, no friction rub, no midsystolic click and no opening snap.  No murmur heard. Pulses:      Posterior tibial pulses are 2+ on the right side and 2+ on the left side.  Pulmonary/Chest: Effort normal and breath sounds normal. No respiratory distress. He has no decreased breath sounds. He has no wheezes. He has no rales. He exhibits no tenderness.  Abdominal: Soft. He exhibits no distension. There is no abdominal tenderness.  Musculoskeletal:        General: No edema.     Cervical back: Normal range of motion.  Neurological: He is alert and oriented to person, place, and time.  Skin: Skin is warm and dry. No cyanosis. Nails show no clubbing.  Psychiatric: He has a normal mood and affect. His speech is normal and behavior is normal. Judgment and thought content normal.    Wt Readings from Last 3 Encounters:  08/18/19 203 lb (92.1 kg)  08/12/19 207 lb 3.2 oz (94 kg)  06/30/19 204 lb (92.5 kg)     ASSESSMENT & PLAN:   1. CAD involving the native coronary arteries with accelerating angina and abnormal Lexiscan Myoview: Currently chest pain-free.  Patient has been dealing with substernal chest tightness with right shoulder discomfort over the past couple of months along with an episode of significant dyspnea and fatigue following a kayaking trip which was  unusual for him.  In this setting, he recently underwent [place] which was high risk.  Given ongoing symptoms and high risk Myoview we have elected to proceed with diagnostic cardiac cath at this time for definitive evaluation.  He will continue aspirin, Plavix, carvedilol, atorvastatin, Zetia, and Entresto.  Risks and benefits of cardiac catheterization have been discussed with the patient  including risks of bleeding, bruising, infection, kidney damage, stroke, heart attack, urgent need for bypass, injury to a limb, and death. The patient understands these risks and is willing to proceed with the procedure. All questions have been answered and concerns listened to.   2. HFrEF secondary to ICM: He is euvolemic and well compensated.  He does indicate he has not been taking spironolactone for the past several months.  Given his underlying CKD, and in the setting of needing diagnostic LHC, we have agreed to postpone the resumption of his medication at this time with plans to resume this following his diagnostic cath as renal function allows.  Otherwise, he will continue carvedilol, Entresto, and furosemide.  CHF education discussed.  3. History of VT: Status post ICD.  He denies any shocks since implantation.  Continue Coreg and amiodarone.  Recent LFT and TSH normal.  4. HTN: Blood pressure is well controlled today.  Continue current therapy as outlined above.  5. HLD: LDL of 69 from 05/2019 with normal liver function.  He remains on atorvastatin and Zetia.  6. Tobacco use: He continues to smoke though has cut back.  Complete cessation is recommended.  7. CKD stage II-III: Stable on most recent check in 05/2019.  Hold spironolactone as outlined above.  Check BMP precath.  Disposition: F/u with Dr. Rockey Situ or an APP 1 week after diagnostic cath.   Medication Adjustments/Labs and Tests Ordered: Current medicines are reviewed at length with the patient today.  Concerns regarding medicines are outlined above. Medication changes, Labs and Tests ordered today are summarized above and listed in the Patient Instructions accessible in Encounters.   Signed, Christell Faith, PA-C 08/18/2019 11:10 AM     [facility] - [address] [place], [place] [address] [phone]

## 2019-08-16 NOTE — Chronic Care Management (AMB) (Signed)
Chronic Care Management   Follow Up Note   08/16/2019 Name: YURIEL TORNO MRN: [id number] DOB: 1955/08/29  Referred by: McLean-Scocuzza, Nino Glow, MD Reason for referral : Chronic Care Management (Medication Management)   KOURY ZAMORA is a 64 y.o. year old male who is a primary care patient of McLean-Scocuzza, Nino Glow, MD. The CCM team was consulted for assistance with chronic disease management and care coordination needs.    Contacted patient for medication management review today.   Review of patient status, including review of consultants reports, relevant laboratory and other test results, and collaboration with appropriate care team members and the patient's provider was performed as part of comprehensive patient evaluation and provision of chronic care management services.    SDOH (Social Determinants of Health) screening performed today: Financial Strain . See Care Plan for related entries.   Outpatient Encounter Medications as of 08/16/2019  Medication Sig Note  . ALPRAZolam (XANAX) 0.5 MG tablet Take 1 tablet (0.5 mg total) by mouth 3 (three) times daily as needed. for anxiety 08/16/2019: Taking 1-2 tablets daily   . amiodarone (PACERONE) 200 MG tablet Take 1.5 tablets (300 mg total) by mouth daily.   Marland Kitchen aspirin 81 MG EC tablet Take 1 tablet (81 mg total) by mouth daily.   Marland Kitchen atorvastatin (LIPITOR) 40 MG tablet Take 1 tablet (40 mg total) by mouth daily.   . carvedilol (COREG) 3.125 MG tablet Take 1 tablet (3.125 mg total) by mouth 2 (two) times daily.   . clopidogrel (PLAVIX) 75 MG tablet Take 1 tablet (75 mg total) by mouth daily.   Marland Kitchen ezetimibe (ZETIA) 10 MG tablet Take 1 tablet (10 mg total) by mouth daily.   . furosemide (LASIX) 40 MG tablet Take 1 tablet (40 mg total) by mouth 2 (two) times daily. (Patient taking differently: Take 80 mg by mouth daily. ) 05/03/2019: Takes 80 mg at once   . mometasone-formoterol (DULERA) 200-5 MCG/ACT AERO Inhale 2 puffs into the lungs 2 (two)  times daily.   . potassium chloride (KLOR-CON 10) 10 MEQ tablet Take 1 tablet (10 mEq total) by mouth daily.   . sacubitril-valsartan (ENTRESTO) 97-103 MG Take 1 tablet by mouth 2 (two) times daily.   . tadalafil (CIALIS) 10 MG tablet Take 1 tablet (10 mg total) by mouth daily as needed for erectile dysfunction (take 30 min prior to when needed.).   Marland Kitchen Tiotropium Bromide-Olodaterol (STIOLTO RESPIMAT) 2.5-2.5 MCG/ACT AERS Inhale 2 puffs into the lungs daily.   . varenicline ([name] STARTING MONTH PAK) 0.5 MG X 11 & 1 MG X 42 tablet Take by mouth 2 (two) times daily. Take one 0.5 mg tablet by mouth once daily for 3 days, then increase to one 0.5 mg tablet twice daily for 4 days, then increase to one 1 mg tablet twice daily.   Marland Kitchen albuterol (VENTOLIN HFA) 108 (90 Base) MCG/ACT inhaler Inhale 1 puff into the lungs every 6 (six) hours as needed for wheezing or shortness of breath.   Marland Kitchen ipratropium-albuterol (DUONEB) 0.5-2.5 (3) MG/3ML SOLN Take 3 mLs by nebulization every 6 (six) hours as needed. (Patient not taking: Reported on 08/16/2019)   . nitroGLYCERIN (NITROSTAT) 0.4 MG SL tablet Place 0.4 mg under the tongue every 5 (five) minutes as needed.    . [DISCONTINUED] tiotropium (SPIRIVA) 18 MCG inhalation capsule Place 18 mcg into inhaler and inhale daily. 07/19/2019: QAM  . [DISCONTINUED] Tiotropium Bromide Monohydrate (SPIRIVA RESPIMAT) 2.5 MCG/ACT AERS Inhale 2 puffs into the lungs daily  for 1 day.    No facility-administered encounter medications on file as of 08/16/2019.     Goals Addressed            This Visit's Progress     Patient Stated   . "I want to quit smoking" (pt-stated)       Current Barriers:  . Tobacco abuse of ~40 years; currently smoking 1/2 ppd; complicated by CAD, CHF, COPD o Today, notes he started [name] yesterday (08/15/2019). He is able to verbalize the starter pack taper (0.5 mg daily x 3 days, 0.5 mg BID x 4 days, then 1 mg BID thereafter) o When asked about setting a  quit date, he notes he wants to quit "as soon as possible" o Wonders if he will still be able to take [name] if he is hospitalized for an elective cath o APPROVED for [name] assistance through 07/27/2020.  Marland Kitchen CAD s/p prior stenting; ASA 81 mg daily, clopidogrel 75 mg daily; atorvastatin 40 mg daily, ezetimibe 10 mg daily; last LDL at goal <70 given significant CV hx o S/p pharmacologic stress test. Has f/u w/ [facility] later this week to discuss results and steps moving forward. . HFrEF s/p AICD placement (most recent EF 25-30%); Entresto 97/103 mg BID, carvedilol 3.125 mg BID, spironolactone 25 mg daily, furosemide 80 mg daily, potassium 10 mEq daily o Reports that he realized he has NOT been taking spironolactone. Unsure if he ever was o APPROVED for [place] assistance through 08/12/2019 . Ventricular tachycardia: amiodarone 300 mg daily; . COPD: Recently switched to Stiolto by Dr. Patsey Berthold. He notes that his breathing seems better on this; he has not needed albuterol HFA or nebulizer. Per notes, he is to just be on Stiolto, but patient reports today that he is still taking Dulera daily as well o Therapist, nutritional through [employer] patient assistance (through 02/17/2020); previously receiving Spiriva through [facility] patient assistance  Pharmacist Clinical Goal(s):  Marland Kitchen Over the next 90 days, patient will work with PharmD and provider towards tobacco cessation . Over the next 90 days, patient will work with multidisciplinary team on medication access.  Interventions: . Comprehensive medication review performed, medication list updated in electronic medical record.  . Congratulated patient on starting [name] and commitment towards tobacco cessation. Counseled to take w/ food to avoid GI upset. Discussed that recommendation is to select a Quit Date 8-35 days after starting. Collaboratively chose August 30, 2019 as patient's quit date.  . Discussed that possibility of elective cath will be decided at appt  w/ Christell Faith later this week. Discussed that I am unsure if [name] is on inpatient formulary, but he should discuss w/ his medical team and request that he bring his [name] supply from home to continue, if he is hospitalized for elective cath. Discussed benefit of continuing [name] for ~12 weeks for best efficacy.  . Will send message to Christell Faith noting that patient has NOT been on spironolactone, to discuss restarting at upcoming appointment . Will collaborate w/ Susy Frizzle, CPhT to mail patient his portion of Stiolto patient assistance application from [employer], and fax provider portion to Dr. Beckey Downing, [place]. Once all parts received, will fax to [facility].  Marland Kitchen Counseled patient to stop Dulera d/t overlap LABA w/ Stiolto. He verbalized understanding.   Patient Self Care Activities:  . Patient will commit to reducing tobacco consumption  Please see past updates related to this goal by clicking on the "Past Updates" button in the selected goal  Plan: - Scheduled f/u call 09/02/19 to follow up on tobacco cessation  Catie Darnelle Maffucci, PharmD, [place], [place] Pharmacist [facility] [phone]

## 2019-08-18 ENCOUNTER — Other Ambulatory Visit: Payer: Self-pay | Admitting: Physician Assistant

## 2019-08-18 ENCOUNTER — Ambulatory Visit (INDEPENDENT_AMBULATORY_CARE_PROVIDER_SITE_OTHER): Payer: Medicare Other | Admitting: Physician Assistant

## 2019-08-18 ENCOUNTER — Other Ambulatory Visit: Payer: Self-pay

## 2019-08-18 ENCOUNTER — Encounter: Payer: Self-pay | Admitting: Physician Assistant

## 2019-08-18 VITALS — BP 110/60 | HR 64 | Ht 68.0 in | Wt 203.0 lb

## 2019-08-18 DIAGNOSIS — I472 Ventricular tachycardia, unspecified: Secondary | ICD-10-CM

## 2019-08-18 DIAGNOSIS — Z720 Tobacco use: Secondary | ICD-10-CM

## 2019-08-18 DIAGNOSIS — I25118 Atherosclerotic heart disease of native coronary artery with other forms of angina pectoris: Secondary | ICD-10-CM | POA: Diagnosis not present

## 2019-08-18 DIAGNOSIS — I200 Unstable angina: Secondary | ICD-10-CM | POA: Diagnosis not present

## 2019-08-18 DIAGNOSIS — I255 Ischemic cardiomyopathy: Secondary | ICD-10-CM | POA: Diagnosis not present

## 2019-08-18 DIAGNOSIS — R9439 Abnormal result of other cardiovascular function study: Secondary | ICD-10-CM | POA: Diagnosis not present

## 2019-08-18 DIAGNOSIS — I5020 Unspecified systolic (congestive) heart failure: Secondary | ICD-10-CM

## 2019-08-18 DIAGNOSIS — E785 Hyperlipidemia, unspecified: Secondary | ICD-10-CM

## 2019-08-18 DIAGNOSIS — I10 Essential (primary) hypertension: Secondary | ICD-10-CM | POA: Diagnosis not present

## 2019-08-18 NOTE — Progress Notes (Signed)
Orders for cardiac cath placed. 

## 2019-08-18 NOTE — Patient Instructions (Addendum)
Medication Instructions:  1- STOP Spironolactone   *If you need a refill on your cardiac medications before your next appointment, please call your pharmacy*  Lab Work: Your physician recommends that you have lab work today(CBC, BMET)  [facility]19 Pre admit testing DRIVE THRU  Please report to the PAT testing site ([facility]) on _______1/21/21__ date ______12:30-2:30PM_____ time for your DRIVE THRU covid testing that is required prior to your procedure.  Following covid testing, please remain in quarantine. If you must be around others, please wash hands, avoid touching face and wear your mask.    If you have labs (blood work) drawn today and your tests are completely normal, you will receive your results only by: Marland Kitchen MyChart Message (if you have MyChart) OR . A paper copy in the mail If you have any lab test that is abnormal or we need to change your treatment, we will call you to review the results.  Testing/Procedures: 1- L heart cath     [facility] [address] [place] [address] Dept: [phone] Loc: [phone]  KAIVEN NEASON  08/18/2019  You are scheduled for a L heart cath with Dr. Fletcher Anon on 08/23/19 1. Please arrive at the [facility] at 8:30 AM (This time is 1 hr before your procedure to ensure your preparation). Free valet parking service is available.   Special note: Every effort is made to have your procedure done on time. Please understand that emergencies sometimes delay scheduled procedures.  2. Diet: Do not eat solid foods after midnight.  The patient may have clear liquids until 5am upon the day of the procedure.  3. Labs: You will need to have blood drawn today  4. Medication instructions in preparation for your procedure: Hold lasix the morning of youir procedure.  On the morning of your procedure, take your Aspirin and plavix and any morning  medicines NOT listed above.  You may use sips of water.  5. Plan for one night stay--bring personal belongings. 6. Bring a current list of your medications and current insurance cards. 7. You MUST have a responsible person to drive you home. 8. Someone MUST be with you the first 24 hours after you arrive home or your discharge will be delayed. 9. Please wear clothes that are easy to get on and off and wear slip-on shoes.  Thank you for allowing [place] to care for you!   -- [facility]   Follow-Up: At [facility], you and your health needs are our priority.  As part of our continuing mission to provide you with exceptional heart care, we have created designated Provider Care Teams.  These Care Teams include your primary Cardiologist (physician) and Advanced Practice Providers (APPs -  Physician Assistants and Nurse Practitioners) who all work together to provide you with the care you need, when you need it.  Your next appointment:   2 week(s)  The format for your next appointment:   In Person  Provider:    You may see Ida Rogue, MD or Christell Faith, PA-C.

## 2019-08-19 ENCOUNTER — Telehealth: Payer: Self-pay

## 2019-08-19 ENCOUNTER — Other Ambulatory Visit
Admission: RE | Admit: 2019-08-19 | Discharge: 2019-08-19 | Disposition: A | Discharge status: Home / Self Care | Payer: Medicare Other | Source: Ambulatory Visit | Attending: Cardiovascular Disease | Admitting: Cardiovascular Disease

## 2019-08-19 DIAGNOSIS — Z20822 Contact with and (suspected) exposure to covid-19: Secondary | ICD-10-CM | POA: Diagnosis not present

## 2019-08-19 DIAGNOSIS — Z01812 Encounter for preprocedural laboratory examination: Secondary | ICD-10-CM | POA: Diagnosis not present

## 2019-08-19 DIAGNOSIS — I5023 Acute on chronic systolic (congestive) heart failure: Secondary | ICD-10-CM

## 2019-08-19 LAB — BASIC METABOLIC PANEL
BUN/Creatinine Ratio: 17 (ref 10–24)
BUN: 37 mg/dL — ABNORMAL HIGH (ref 8–27)
CO2: 16 mmol/L — ABNORMAL LOW (ref 20–29)
Calcium: 9.3 mg/dL (ref 8.6–10.2)
Chloride: 104 mmol/L (ref 96–106)
Creatinine, Ser: 2.24 mg/dL — ABNORMAL HIGH (ref 0.76–1.27)
GFR calc Af Amer: 34 mL/min/{1.73_m2} — ABNORMAL LOW (ref >59)
GFR calc non Af Amer: 29 mL/min/{1.73_m2} — ABNORMAL LOW (ref >59)
Glucose: 116 mg/dL — ABNORMAL HIGH (ref 65–99)
Potassium: 5 mmol/L (ref 3.5–5.2)
Sodium: 139 mmol/L (ref 134–144)

## 2019-08-19 LAB — SARS CORONAVIRUS 2 (TAT 6-24 HRS): SARS Coronavirus 2: NEGATIVE

## 2019-08-19 LAB — CBC

## 2019-08-19 NOTE — Addendum Note (Signed)
Addended by: Verlon Au on: 08/19/2019 04:15 PM   Modules accepted: Orders

## 2019-08-19 NOTE — Telephone Encounter (Signed)
Call to patient to review labs and POC.   Pt verbalized understanding to stop Lasix, Entresto, and potassium.  He will increase fluids and will go tomorrow around 2 for lab redraw. Order placed for medical mall.  Made call to centralized scheduling to make them aware that we will have to postpone cath at this time.   Pt agreeable with POC.   Provider made aware.

## 2019-08-19 NOTE — Telephone Encounter (Signed)
Patient returning call.

## 2019-08-19 NOTE — Telephone Encounter (Signed)
Can you make sure specials and the cath lab know as well. He will need a CBC prior to [facility] as well. The test was cancelled. We can draw that tomorrow when he comes for his repeat BMET.

## 2019-08-19 NOTE — Telephone Encounter (Signed)
Attempted to call patient. [facility] 08/19/2019

## 2019-08-19 NOTE — Telephone Encounter (Signed)
-----   Message from Rise Mu, PA-C sent at 08/19/2019 12:42 PM EST ----- Pre cath labs showed significantly worsening renal function.  Potassium is high normal.  Blood count is pending.   Please have him stop Lasix, Entresto, and potassium. These will need to be restarted in a step-wise order as his renal function improves.   Please have him increase his water intake today and over the weekend to hydrate.  We will need to postpone his cath until we can document that his renal function is back to baseline, hopefully later in the week or in the week after.  He will need follow up BMET 08/20/2019 to see how his labs are trending heading into the weekend.

## 2019-08-20 ENCOUNTER — Telehealth: Payer: Self-pay

## 2019-08-20 ENCOUNTER — Other Ambulatory Visit
Admission: RE | Admit: 2019-08-20 | Discharge: 2019-08-20 | Disposition: A | Discharge status: Home / Self Care | Payer: Medicare Other | Source: Ambulatory Visit | Attending: Physician Assistant | Admitting: Physician Assistant

## 2019-08-20 DIAGNOSIS — I5023 Acute on chronic systolic (congestive) heart failure: Secondary | ICD-10-CM

## 2019-08-20 LAB — CBC
HCT: 47 % (ref 39.0–52.0)
Hemoglobin: 15.1 g/dL (ref 13.0–17.0)
MCH: 28.7 pg (ref 26.0–34.0)
MCHC: 32.1 g/dL (ref 30.0–36.0)
MCV: 89.2 fL (ref 80.0–100.0)
Platelets: 193 10*3/uL (ref 150–400)
RBC: 5.27 MIL/uL (ref 4.22–5.81)
RDW: 15.2 % (ref 11.5–15.5)
WBC: 7 10*3/uL (ref 4.0–10.5)
nRBC: 0 % (ref 0.0–0.2)

## 2019-08-20 LAB — BASIC METABOLIC PANEL
Anion gap: 9 (ref 5–15)
BUN: 28 mg/dL — ABNORMAL HIGH (ref 8–23)
CO2: 25 mmol/L (ref 22–32)
Calcium: 8.8 mg/dL — ABNORMAL LOW (ref 8.9–10.3)
Chloride: 102 mmol/L (ref 98–111)
Creatinine, Ser: 1.56 mg/dL — ABNORMAL HIGH (ref 0.61–1.24)
GFR calc Af Amer: 53 mL/min — ABNORMAL LOW (ref >60)
GFR calc non Af Amer: 46 mL/min — ABNORMAL LOW (ref >60)
Glucose, Bld: 94 mg/dL (ref 70–99)
Potassium: 4.2 mmol/L (ref 3.5–5.1)
Sodium: 136 mmol/L (ref 135–145)

## 2019-08-20 MED ORDER — FUROSEMIDE 40 MG PO TABS: 40.0000 mg | ORAL_TABLET | Freq: Every day | ORAL | 3 refills | Status: DC

## 2019-08-20 NOTE — Telephone Encounter (Signed)
-----   Message from Rise Mu, PA-C sent at 08/20/2019  2:16 PM EST ----- With holding of Lasix, Entresto, and KCl his renal function has improved significantly already and is back to his approximate baseline.  Potassium is also improved.  Blood count is normal.  Recommendations: -We will continue to refrain from adding back spironolactone -Continue to hold Lasix for another day and resume at lower dose of 40 mg once daily -When he resumes his Lasix, he should start KCl 10 mEq daily -I would like to ensure his renal function remains stable with the above regimen prior to proceeding with cath in effort to attempt to avoid permanent renal damage -Lets have him come back for 1 more BMET early next week -Dr. Fletcher Anon is cathing in [place] next week if patient is agreeable to having the cath done there, as I know he referred to Dr. Fletcher Anon do the cath

## 2019-08-20 NOTE — Telephone Encounter (Signed)
Patient calling to check lab results from today

## 2019-08-20 NOTE — Telephone Encounter (Signed)
Call to patient to discuss results and POC.   Pt verbalized understanding and is in agreement with POC.   He will resume Lasix at lower dose starting Sunday. Repeat labs Tuesday. At that time he would like to further discuss cath.   All questions were answered.

## 2019-08-20 NOTE — Telephone Encounter (Signed)
-----   Message from Rise Mu, PA-C sent at 08/20/2019  2:16 PM EST ----- With holding of Lasix, Entresto, and KCl his renal function has improved significantly already and is back to his approximate baseline.  Potassium is also improved.  Blood count is normal.  Recommendations: -We will continue to refrain from adding back spironolactone -Continue to hold Lasix for another day and resume at lower dose of 40 mg once daily -When he resumes his Lasix, he should start KCl 10 mEq daily -I would like to ensure his renal function remains stable with the above regimen prior to proceeding with cath in effort to attempt to avoid permanent renal damage -Lets have him come back for 1 more BMET early next week -Dr. Fletcher Anon is cathing in [place] next week if patient is agreeable to having the cath done there, as I know he referred to Dr. Fletcher Anon do the cath

## 2019-08-23 ENCOUNTER — Emergency Department
Admission: EM | Admit: 2019-08-23 | Discharge: 2019-08-23 | Disposition: A | Discharge status: Home / Self Care | Payer: Medicare Other | Attending: Emergency Medicine | Admitting: Emergency Medicine

## 2019-08-23 ENCOUNTER — Other Ambulatory Visit: Payer: Self-pay

## 2019-08-23 ENCOUNTER — Encounter: Payer: Self-pay | Admitting: Emergency Medicine

## 2019-08-23 ENCOUNTER — Encounter: Admission: RE | Payer: Self-pay | Source: Home / Self Care

## 2019-08-23 ENCOUNTER — Emergency Department: Payer: Medicare Other

## 2019-08-23 ENCOUNTER — Ambulatory Visit: Admission: RE | Admit: 2019-08-23 | Payer: Medicare Other | Source: Home / Self Care | Admitting: Cardiovascular Disease

## 2019-08-23 DIAGNOSIS — Z7982 Long term (current) use of aspirin: Secondary | ICD-10-CM | POA: Insufficient documentation

## 2019-08-23 DIAGNOSIS — Z79899 Other long term (current) drug therapy: Secondary | ICD-10-CM | POA: Insufficient documentation

## 2019-08-23 DIAGNOSIS — I2510 Atherosclerotic heart disease of native coronary artery without angina pectoris: Secondary | ICD-10-CM | POA: Insufficient documentation

## 2019-08-23 DIAGNOSIS — F17210 Nicotine dependence, cigarettes, uncomplicated: Secondary | ICD-10-CM | POA: Diagnosis not present

## 2019-08-23 DIAGNOSIS — R079 Chest pain, unspecified: Secondary | ICD-10-CM | POA: Diagnosis not present

## 2019-08-23 DIAGNOSIS — I110 Hypertensive heart disease with heart failure: Secondary | ICD-10-CM | POA: Insufficient documentation

## 2019-08-23 DIAGNOSIS — R0602 Shortness of breath: Secondary | ICD-10-CM | POA: Insufficient documentation

## 2019-08-23 DIAGNOSIS — J449 Chronic obstructive pulmonary disease, unspecified: Secondary | ICD-10-CM | POA: Diagnosis not present

## 2019-08-23 DIAGNOSIS — Z7901 Long term (current) use of anticoagulants: Secondary | ICD-10-CM | POA: Insufficient documentation

## 2019-08-23 DIAGNOSIS — R55 Syncope and collapse: Secondary | ICD-10-CM | POA: Diagnosis not present

## 2019-08-23 DIAGNOSIS — Z95810 Presence of automatic (implantable) cardiac defibrillator: Secondary | ICD-10-CM | POA: Insufficient documentation

## 2019-08-23 DIAGNOSIS — R519 Headache, unspecified: Secondary | ICD-10-CM | POA: Insufficient documentation

## 2019-08-23 DIAGNOSIS — Z20822 Contact with and (suspected) exposure to covid-19: Secondary | ICD-10-CM | POA: Insufficient documentation

## 2019-08-23 DIAGNOSIS — I5022 Chronic systolic (congestive) heart failure: Secondary | ICD-10-CM | POA: Diagnosis not present

## 2019-08-23 DIAGNOSIS — R072 Precordial pain: Secondary | ICD-10-CM | POA: Diagnosis not present

## 2019-08-23 DIAGNOSIS — J441 Chronic obstructive pulmonary disease with (acute) exacerbation: Secondary | ICD-10-CM | POA: Diagnosis not present

## 2019-08-23 LAB — CBC
HCT: 43.9 % (ref 39.0–52.0)
Hemoglobin: 14.2 g/dL (ref 13.0–17.0)
MCH: 28.5 pg (ref 26.0–34.0)
MCHC: 32.3 g/dL (ref 30.0–36.0)
MCV: 88 fL (ref 80.0–100.0)
Platelets: 204 10*3/uL (ref 150–400)
RBC: 4.99 MIL/uL (ref 4.22–5.81)
RDW: 15.2 % (ref 11.5–15.5)
WBC: 10.4 10*3/uL (ref 4.0–10.5)
nRBC: 0 % (ref 0.0–0.2)

## 2019-08-23 LAB — BASIC METABOLIC PANEL
Anion gap: 7 (ref 5–15)
BUN: 21 mg/dL (ref 8–23)
CO2: 24 mmol/L (ref 22–32)
Calcium: 8.9 mg/dL (ref 8.9–10.3)
Chloride: 104 mmol/L (ref 98–111)
Creatinine, Ser: 1.53 mg/dL — ABNORMAL HIGH (ref 0.61–1.24)
GFR calc Af Amer: 54 mL/min — ABNORMAL LOW (ref >60)
GFR calc non Af Amer: 47 mL/min — ABNORMAL LOW (ref >60)
Glucose, Bld: 90 mg/dL (ref 70–99)
Potassium: 4.4 mmol/L (ref 3.5–5.1)
Sodium: 135 mmol/L (ref 135–145)

## 2019-08-23 LAB — LIPASE, BLOOD: Lipase: 26 U/L (ref 11–51)

## 2019-08-23 LAB — HEPATIC FUNCTION PANEL
ALT: 41 U/L (ref 0–44)
AST: 26 U/L (ref 15–41)
Albumin: 3.8 g/dL (ref 3.5–5.0)
Alkaline Phosphatase: 107 U/L (ref 38–126)
Bilirubin, Direct: 0.1 mg/dL (ref 0.0–0.2)
Indirect Bilirubin: 0.7 mg/dL (ref 0.3–0.9)
Total Bilirubin: 0.8 mg/dL (ref 0.3–1.2)
Total Protein: 7.1 g/dL (ref 6.5–8.1)

## 2019-08-23 LAB — TROPONIN I (HIGH SENSITIVITY)
Troponin I (High Sensitivity): 9 ng/L (ref <18)
Troponin I (High Sensitivity): 9 ng/L (ref <18)

## 2019-08-23 LAB — RESPIRATORY PANEL BY RT PCR (FLU A&B, COVID)
Influenza A by PCR: NEGATIVE
Influenza B by PCR: NEGATIVE
SARS Coronavirus 2 by RT PCR: NEGATIVE

## 2019-08-23 SURGERY — LEFT HEART CATH AND CORONARY ANGIOGRAPHY
Anesthesia: Moderate Sedation | Laterality: Left

## 2019-08-23 MED ORDER — IPRATROPIUM-ALBUTEROL 0.5-2.5 (3) MG/3ML IN SOLN
3.0000 mL | Freq: Once | RESPIRATORY_TRACT | Status: AC
  Administered 2019-08-23: 3 mL via RESPIRATORY_TRACT
  Filled 2019-08-23: qty 3

## 2019-08-23 MED ORDER — ASPIRIN 81 MG PO CHEW
324.0000 mg | CHEWABLE_TABLET | Freq: Once | ORAL | Status: AC
  Administered 2019-08-23: 17:00:00 324 mg via ORAL
  Filled 2019-08-23: qty 4

## 2019-08-23 MED ORDER — PREDNISONE 20 MG PO TABS: 40.0000 mg | ORAL_TABLET | Freq: Every day | ORAL | 0 refills | Status: AC

## 2019-08-23 MED ORDER — ONDANSETRON 4 MG PO TBDP
ORAL_TABLET | ORAL | Status: AC
  Filled 2019-08-23: qty 1

## 2019-08-23 MED ORDER — DIPHENHYDRAMINE HCL 50 MG/ML IJ SOLN
12.5000 mg | Freq: Once | INTRAMUSCULAR | Status: AC
  Administered 2019-08-23: 12.5 mg via INTRAVENOUS
  Filled 2019-08-23: qty 1

## 2019-08-23 MED ORDER — MORPHINE SULFATE (PF) 4 MG/ML IV SOLN
6.0000 mg | Freq: Once | INTRAVENOUS | Status: DC
  Filled 2019-08-23: qty 2

## 2019-08-23 MED ORDER — METOCLOPRAMIDE HCL 5 MG/ML IJ SOLN
10.0000 mg | Freq: Once | INTRAMUSCULAR | Status: AC
  Administered 2019-08-23: 10 mg via INTRAVENOUS
  Filled 2019-08-23: qty 2

## 2019-08-23 MED ORDER — ONDANSETRON HCL 4 MG/2ML IJ SOLN
4.0000 mg | Freq: Once | INTRAMUSCULAR | Status: DC
  Filled 2019-08-23: qty 2

## 2019-08-23 MED ORDER — ONDANSETRON 4 MG PO TBDP
4.0000 mg | ORAL_TABLET | Freq: Once | ORAL | Status: AC
  Administered 2019-08-23: 4 mg via ORAL

## 2019-08-23 MED ORDER — FUROSEMIDE 40 MG PO TABS
40.0000 mg | ORAL_TABLET | Freq: Once | ORAL | Status: AC
  Administered 2019-08-23: 40 mg via ORAL
  Filled 2019-08-23: qty 1

## 2019-08-23 MED ORDER — PREDNISONE 20 MG PO TABS
60.0000 mg | ORAL_TABLET | Freq: Once | ORAL | Status: AC
  Administered 2019-08-23: 60 mg via ORAL
  Filled 2019-08-23: qty 3

## 2019-08-23 MED ORDER — IBUPROFEN 400 MG PO TABS
400.0000 mg | ORAL_TABLET | Freq: Once | ORAL | Status: AC | PRN
  Administered 2019-08-23: 400 mg via ORAL
  Filled 2019-08-23: qty 1

## 2019-08-23 MED ORDER — CARVEDILOL 6.25 MG PO TABS
6.2500 mg | ORAL_TABLET | Freq: Two times a day (BID) | ORAL | Status: DC
  Administered 2019-08-23: 6.25 mg via ORAL
  Filled 2019-08-23: qty 1

## 2019-08-23 MED ORDER — BUTALBITAL-APAP-CAFFEINE 50-325-40 MG PO TABS: 1.0000 | ORAL_TABLET | Freq: Four times a day (QID) | ORAL | 0 refills | Status: AC | PRN

## 2019-08-23 MED ORDER — SODIUM CHLORIDE 0.9% FLUSH: 3.0000 mL | Freq: Once | INTRAVENOUS | Status: DC

## 2019-08-23 MED ORDER — MORPHINE SULFATE (PF) 4 MG/ML IV SOLN
6.0000 mg | Freq: Once | INTRAVENOUS | Status: AC
  Administered 2019-08-23: 6 mg via INTRAMUSCULAR

## 2019-08-23 MED ORDER — ONDANSETRON 4 MG PO TBDP: 4.0000 mg | ORAL_TABLET | Freq: Three times a day (TID) | ORAL | 0 refills | Status: DC | PRN

## 2019-08-23 NOTE — Telephone Encounter (Signed)
Noted.  Looks like he just got to the ER.  We will await their evaluation.

## 2019-08-23 NOTE — Telephone Encounter (Signed)
Returned call to patient to make him aware that I have not seen results from labs taken today. We will call as soon as we see them.   Advised pt to call for any further questions or concerns.

## 2019-08-23 NOTE — ED Triage Notes (Signed)
Pt presents to ED with c/o substernal CP that radiates to L chest and up to L jaw. 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 Pt states was scheduled to have a heart cath done today, however due to kidney function on pre-cath labs last week was unable to have cath done today, was supposed to have labs redrawn today but due to pain that he states is 6/10, he did not go have labs drawn this morning, came to ED instead.

## 2019-08-23 NOTE — Telephone Encounter (Signed)
FYI the patient is currently admitted.

## 2019-08-23 NOTE — Telephone Encounter (Signed)
PAtient calling in to state he will be unable to get the discussed blood work done today. He is currently in the hospital

## 2019-08-23 NOTE — ED Provider Notes (Signed)
[facility] Emergency Department Provider Note  ____________________________________________   First MD Initiated Contact with Patient 08/23/19 1612     (approximate)  I have reviewed the triage vital signs and the nursing notes.   HISTORY  Chief Complaint Chest Pain    HPI REVERE WHEELING is a 64 y.o. male with coronary disease, EF of 30%, ICD who comes in for chest pain.  Patient was supposed to have a heart cath done today but due to his kidney function on his precath labs last week he was unable to have the catheterization done.  Supposed to have his labs redrawn today but do the pain he came to the ER instead.  Patient states that he was having a headache, nausea, chest pain.  His symptoms started this morning, severe, 8 out of 10, nothing makes them better, nothing makes them worse.  States that the chest pain radiates up into his jaw.  He also reports that he was taken off his Entresto and his Lasix due to his kidney function being elevated last week.  The headache has been gradually getting worse.  Not severe and thunderclap on onset.  He started to feel little bit of swelling in his hands a little bit of shortness of breath related to this.  He also does have a history of COPD.  No history of PE, no leg swelling.          Past Medical History:  Diagnosis Date  . Coronary artery disease    a. 3 stents to the LAD, 1 to the PDA, and 1 to the OM3; b. cath 06/2013: chronically occluded LAD, patent stent LAD, D1 20%, pLCx 30%, mLCx 20%, pRCA 70% (FFR 0.74) s/p PCI/DES, mRCA 70% s/p PCI/DES, RPDA 50%, EF 20% w/ AK of anterolateral & apical walls, moderately elevated LVEDP  . HFrEF (heart failure with reduced ejection fraction) ([place])    a. 05/2014 Echo: EF 25-30%; b. 01/2018 Echo ([facility]): EF 30%.  Marland Kitchen HLD (hyperlipidemia)   . HTN (hypertension)   . ICD  single,BSX    a. DOI 11/2011; b. S/N# [id number]  . Ischemic cardiomyopathy    a. 05/2014 Echo: EF 25-30%; b.  01/2018 Echo ([facility]): EF 30%, sev glob HK w/ apical/ant AK. Mild LVH. Mod LAE, mild RAE. Triv MR/TR.  Marland Kitchen Pneumonia    08/2018  . Syncope and collapse   . Ventricular tachycardia ([place]) 11/2011   a. s/p AICD implant     Patient Active Problem List   Diagnosis Date Noted  . Carotid artery stenosis 06/10/2019  . Baker's cyst 06/10/2019  . Bilateral impacted cerumen 06/10/2019  . Chronic systolic congestive heart failure ([place]) 06/10/2019  . Scoliosis 06/10/2019  . Anxiety 04/15/2019  . Chronic obstructive pulmonary disease ([place]) 04/15/2019  . COPD exacerbation ([place]) 09/08/2018  . Acute on chronic systolic CHF (congestive heart failure) ([place]) 06/27/2016  . Dizziness 03/30/2015  . Smoker 09/29/2014  . Syncope 06/01/2014  . Insomnia 06/01/2014  . Single implantable cardioverter-defibrillator-BSx   . Ischemic cardiomyopathy   . Ventricular tachycardia ([place]) 01/23/2012  . SYNCOPE AND COLLAPSE 09/26/2010  . Hyperlipidemia 02/23/2010  . Hypotension 02/23/2010  . CAD (coronary artery disease) 02/23/2010    Past Surgical History:  Procedure Laterality Date  . appendectomy    . CARDIAC CATHETERIZATION  Nov 30 2011   [facility]  . CARDIAC DEFIBRILLATOR PLACEMENT  May 2013  . CORONARY ANGIOPLASTY  2014   x 2 stents  . CORONARY STENT PLACEMENT  x7  . PROSTATE SURGERY     urethra grew around prostate   . TUMOR REMOVAL  2001   chest thymic cyst; behind lungs [facility] benign     Prior to Admission medications   Medication Sig Start Date End Date Taking? Authorizing Provider  albuterol (VENTOLIN HFA) 108 (90 Base) MCG/ACT inhaler Inhale 1 puff into the lungs every 6 (six) hours as needed for wheezing or shortness of breath.    [provider]  ALPRAZolam Duanne Moron) 0.5 MG tablet Take 1 tablet (0.5 mg total) by mouth 3 (three) times daily as needed. for anxiety Patient taking differently: Take 0.5 mg by mouth 3 (three) times daily as needed for anxiety.  07/28/19   McLean-Scocuzza, Nino Glow, MD    amiodarone (PACERONE) 200 MG tablet Take 1.5 tablets (300 mg total) by mouth daily. 11/03/18   Minna Merritts, MD  aspirin 81 MG EC tablet Take 1 tablet (81 mg total) by mouth daily. 09/11/15   Minna Merritts, MD  atorvastatin (LIPITOR) 40 MG tablet Take 1 tablet (40 mg total) by mouth daily. 11/03/18   Minna Merritts, MD  carvedilol (COREG) 3.125 MG tablet Take 1 tablet (3.125 mg total) by mouth 2 (two) times daily. 11/03/18   Minna Merritts, MD  clopidogrel (PLAVIX) 75 MG tablet Take 1 tablet (75 mg total) by mouth daily. 11/03/18   Minna Merritts, MD  ezetimibe (ZETIA) 10 MG tablet Take 1 tablet (10 mg total) by mouth daily. 11/03/18   Minna Merritts, MD  finasteride (PROSCAR) 5 MG tablet Take 5 mg by mouth daily. 06/29/19   [provider]  furosemide (LASIX) 40 MG tablet Take 1 tablet (40 mg total) by mouth daily. 08/20/19   Dunn, Areta Haber, PA-C  ipratropium-albuterol (DUONEB) 0.5-2.5 (3) MG/3ML SOLN Take 3 mLs by nebulization every 6 (six) hours as needed. Patient not taking: Reported on 08/19/2019 09/14/18   Fritzi Mandes, MD  mometasone-formoterol [facility]) 200-5 MCG/ACT AERO Inhale 2 puffs into the lungs 2 (two) times daily. Patient not taking: Reported on 08/19/2019 01/14/19   Laverle Hobby, MD  nitroGLYCERIN (NITROSTAT) 0.4 MG SL tablet Place 0.4 mg under the tongue every 5 (five) minutes as needed.  02/13/18   [provider]  potassium chloride (KLOR-CON 10) 10 MEQ tablet Take 1 tablet (10 mEq total) by mouth daily. 11/03/18   Minna Merritts, MD  sacubitril-valsartan (ENTRESTO) 97-103 MG Take 1 tablet by mouth 2 (two) times daily. 04/19/19   Minna Merritts, MD  tadalafil (CIALIS) 10 MG tablet Take 1 tablet (10 mg total) by mouth daily as needed for erectile dysfunction (take 30 min prior to when needed.). 12/30/16 11/01/19  Minna Merritts, MD  Tiotropium Bromide-Olodaterol (STIOLTO RESPIMAT) 2.5-2.5 MCG/ACT AERS Inhale 2 puffs into the lungs daily. 08/12/19    Tyler Pita, MD  varenicline ([name] STARTING MONTH PAK) 0.5 MG X 11 & 1 MG X 42 tablet Take 0.5 mg by mouth 2 (two) times daily.     [provider]    Allergies Nitroglycerin  Family History  Problem Relation Age of Onset  . Cancer Father        larynx  . Cancer - Lung Mother   . Hypertension Mother   . Heart attack Mother   . Dementia Maternal Grandmother   . Heart attack Paternal Grandfather     Social History Social History   Tobacco Use  . Smoking status: Current Some Day Smoker  Packs/day: 0.25    Years: 42.00    Pack years: 10.50    Types: Cigarettes    Last attempt to quit: 09/03/2018    Years since quitting: 0.9  . Smokeless tobacco: Never Used  . Tobacco comment: smoking 7 a day-08/12/2019  Substance Use Topics  . Alcohol use: Yes    Alcohol/week: 1.0 standard drinks    Types: 1 Cans of beer per week    Comment: per week  . Drug use: No      Review of Systems Constitutional: No fever/chills Eyes: No visual changes. ENT: No sore throat. Cardiovascular: Positive chest pain Respiratory: mild SOB Gastrointestinal: No abdominal pain.  +nausea/vomiting .  No diarrhea.  No constipation. Genitourinary: Negative for dysuria. Musculoskeletal: Negative for back pain. Skin: Negative for rash. Neurological: Positive headache, no focal weakness or numbness. All other ROS negative ____________________________________________   PHYSICAL EXAM:  VITAL SIGNS: ED Triage Vitals  Enc Vitals Group     BP 08/23/19 1240 (!) 191/104     Pulse Rate 08/23/19 1240 73     Resp 08/23/19 1240 20     Temp 08/23/19 1240 98.1 F (36.7 C)     Temp Source 08/23/19 1240 Oral     SpO2 08/23/19 1240 100 %     Weight 08/23/19 1241 203 lb (92.1 kg)     Height 08/23/19 1241 5\' 8"  (1.727 m)     Head Circumference --      Peak Flow --      Pain Score 08/23/19 1241 6     Pain Loc --      Pain Edu? --      Excl. in [place]? --     Constitutional: Alert and  oriented.  Appears to be in discomfort. Eyes: Conjunctivae are normal. EOMI. Head: Atraumatic. Nose: No congestion/rhinnorhea. Mouth/Throat: Mucous membranes are moist.   Neck: No stridor. Trachea Midline. FROM Cardiovascular: Normal rate, regular rhythm. Grossly normal heart sounds.  Good peripheral circulation.  Palpable ICD on his left chest wall. Respiratory: Normal respiratory effort.  No retractions. Mild exp wheezing left lungs. Gastrointestinal: Soft and nontender. No distention. No abdominal bruits.  Musculoskeletal: No lower extremity tenderness nor edema.  No joint effusions. Neurologic:  Normal speech and language. No gross focal neurologic deficits are appreciated.  Skin:  Skin is warm, dry and intact. No rash noted. Psychiatric: Mood and affect are normal. Speech and behavior are normal. GU: Deferred   ____________________________________________   LABS (all labs ordered are listed, but only abnormal results are displayed)  Labs Reviewed  BASIC METABOLIC PANEL - Abnormal; Notable for the following components:      Result Value   Creatinine, Ser 1.53 (*)    GFR calc non Af Amer 47 (*)    GFR calc Af Amer 54 (*)    All other components within normal limits  RESPIRATORY PANEL BY RT PCR (FLU A&B, COVID)  CBC  HEPATIC FUNCTION PANEL  LIPASE, BLOOD  TROPONIN I (HIGH SENSITIVITY)  TROPONIN I (HIGH SENSITIVITY)   ____________________________________________   ED ECG REPORT I, Vanessa Blaine, the attending physician, personally viewed and interpreted this ECG.  EKG normal sinus rate of 74, no ST elevations, T wave inversion in aVL, normal intervals.  T wave inversion is similar to prior. ____________________________________________  [place], personally viewed and evaluated these images (plain radiographs) as part of my medical decision making, as well as reviewing the written report by the radiologist.  ED MD  interpretation: No pulmonary edema  noted.  Official radiology report(s): DG Chest 2 View  Result Date: 08/23/2019 CLINICAL DATA:  Substernal chest pain EXAM: CHEST - 2 VIEW COMPARISON:  09/08/2018 FINDINGS: The lungs are clear without focal pneumonia, edema, pneumothorax or pleural effusion. Interstitial markings are diffusely coarsened with chronic features. Cardiopericardial silhouette is at upper limits of normal for size. Single lead AICD again noted. The visualized bony structures of the thorax are intact. IMPRESSION: Stable.  No acute findings. Electronically Signed   By: Misty Stanley M.D.   On: 08/23/2019 14:08    ____________________________________________   PROCEDURES  Procedure(s) performed (including Critical Care):  Ultrasound ED Peripheral IV (Provider)  Date/Time: 08/23/2019 6:28 PM Performed by: Vanessa Finc[place]le, MD Authorized by: Vanessa [place], MD   Procedure details:    Indications: multiple failed IV attempts     Skin Prep: chlorhexidine gluconate     Location:  Right AC   Angiocath:  20 G   Bedside Ultrasound Guided: Yes     Images: not archived     Patient tolerated procedure without complications: Yes     Dressing applied: Yes       ____________________________________________   INITIAL IMPRESSION / ASSESSMENT AND PLAN / ED COURSE   Ian Mills was evaluated in Emergency Department on 08/23/2019 for the symptoms described in the history of present illness. He was evaluated in the context of the global COVID-19 pandemic, which necessitated consideration that the patient might be at risk for infection with the SARS-CoV-2 virus that causes COVID-19. Institutional protocols and algorithms that pertain to the evaluation of patients at risk for COVID-19 are in a state of rapid change based on information released by regulatory bodies including the CDC and federal and state organizations. These policies and algorithms were followed during the patient's care in the ED.    Most Likely DDx:   -Patient is does have a headache with elevated blood pressure is most likely secondary to him holding his blood pressure medications for the catheterization.  Will get CT head to make sure no intracranial hemorrhage.  Patient also with chest pain.  Will get cardiac markers to evaluate for ACS.  Will give some pain medication and reevaluate patient.  For his headaches given his high blood pressure will get CT scan to make sure no signs of intracranial hemorrhage.  Lower suspicion for subarachnoid hemorrhage given not maximal or thunderclap in onset.   DDx that was also considered d/t potential to cause harm, but was found less likely based on history and physical (as detailed above): -PNA (no fevers, cough but CXR to evaluate) -PNX (reassured with equal b/l breath sounds, CXR to evaluate) -Symptomatic anemia (will get H&H) -Pulmonary embolism as mild sob more likely from COPD, not pleuritic in nature, no hypoxia -Aortic Dissection as no tearing pain and no radiation to the mid back, pulses equal -Pericarditis no rub on exam, EKG changes or hx to suggest dx -Tamponade (no notable SOB, tachycardic, hypotensive) -Esophageal rupture (no h/o diffuse vomitting/no crepitus)  Troponins are negative x2.  No evidence of anemia.  No white count elevation to suggest infection.  Creatinine is slightly elevated at 1.53 but this is around patient's baseline and downtrending from 5 days ago.  D/w Dr. Fletcher Anon from cardiology recommended going up on his coreg if his pulse rate tolerates.  Given his reassuring EKG and negative troponin x2 we will try to reschedule his cath for later this week. Does not need admission for  this alone.  Ct head negative.  6:27 PM re-evaluated patient does have some bilateral wheezing and sats 92%  Will give some duo nebs.  Patient continued to have a headache will give a migraine cocktail as well.  IV placed by myself due to multiple fail attempts.  Will add on LFT/lipase given  Nausea.  Labs are reassuring.  Covid swab is negative.  Patient sats are now 98% after breathing treatments.  Patient is wheezing much improved.  Ambulatory sat was normal.   Reevaluated patient.  We discussed options of admission versus discharge home.  Patient feels comfortable with being discharged home at this time.  States that his symptoms are much better.  We will send with a course of prednisone for COPD. Gold criteria negative.  Also give some Fioricet for his headache.  Explained not to drive while on this. Patient will follow up with cardiology for his catheterization.  I discussed the provisional nature of ED diagnosis, the treatment so far, the ongoing plan of care, follow up appointments and return precautions with the patient and any family or support people present. They expressed understanding and agreed with the plan, discharged home.    ____________________________________________   FINAL CLINICAL IMPRESSION(S) / ED DIAGNOSES   Final diagnoses:  Chest pain, unspecified type  Chronic obstructive pulmonary disease, unspecified COPD type ([place])     MEDICATIONS GIVEN DURING THIS VISIT:  Medications  sodium chloride flush (NS) 0.9 % injection 3 mL (3 mLs Intravenous Not Given 08/23/19 1605)  morphine 4 MG/ML injection 6 mg (6 mg Intravenous Not Given 08/23/19 1709)  ondansetron (ZOFRAN) injection 4 mg (4 mg Intravenous Not Given 08/23/19 1709)  carvedilol (COREG) tablet 6.25 mg (6.25 mg Oral Given 08/23/19 1816)  predniSONE (DELTASONE) tablet 60 mg (has no administration in time range)  ibuprofen (ADVIL) tablet 400 mg (400 mg Oral Given 08/23/19 1457)  aspirin chewable tablet 324 mg (324 mg Oral Given 08/23/19 1709)  morphine 4 MG/ML injection 6 mg (6 mg Intramuscular Given 08/23/19 1728)  ondansetron (ZOFRAN-ODT) disintegrating tablet 4 mg (4 mg Oral Given 08/23/19 1727)  ipratropium-albuterol (DUONEB) 0.5-2.5 (3) MG/3ML nebulizer solution 3 mL (3 mLs Nebulization Given  08/23/19 1855)  ipratropium-albuterol (DUONEB) 0.5-2.5 (3) MG/3ML nebulizer solution 3 mL (3 mLs Nebulization Given 08/23/19 1855)  ipratropium-albuterol (DUONEB) 0.5-2.5 (3) MG/3ML nebulizer solution 3 mL (3 mLs Nebulization Given 08/23/19 1852)  metoCLOPramide (REGLAN) injection 10 mg (10 mg Intravenous Given 08/23/19 1850)  diphenhydrAMINE (BENADRYL) injection 12.5 mg (12.5 mg Intravenous Given 08/23/19 1850)  furosemide (LASIX) tablet 40 mg (40 mg Oral Given 08/23/19 1850)     ED Discharge Orders         Ordered    ondansetron (ZOFRAN ODT) 4 MG disintegrating tablet  Every 8 hours PRN     08/23/19 2025    predniSONE (DELTASONE) 20 MG tablet  Daily     08/23/19 2025    butalbital-acetaminophen-caffeine (FIORICET) 50-325-40 MG tablet  Every 6 hours PRN     08/23/19 2025           Note:  This document was prepared using Dragon voice recognition software and may include unintentional dictation errors.   Vanessa Jasper, MD 08/23/19 2027

## 2019-08-23 NOTE — ED Notes (Signed)
Patient ambulated for assessment of oxygen saturations with ambulation. Patients oxygen saturations remained 96% to 100% with ambulation. Patient complained of SOB with ambulation but was able to ambulate without assistance

## 2019-08-23 NOTE — ED Notes (Signed)
Pt is difficult stick, this RN unable to obtain PIV access at this time.  EDP, [place] made aware; see new orders.

## 2019-08-23 NOTE — Discharge Instructions (Addendum)
You should double your carvedilol to 2 pills or 6.25 mg twice daily to help with your blood pressure. You should take the prednisone to help with COPD. I did not give the antibiotic because he can be affected by your amiodarone but the prednisone should be enough.  Use your albuterol every 4-6 hours.  You can take the Fioricet as needed for headaches and use that with Zofran for nausea.  You cannot drive while on this medication.  You should follow-up with cardiology for your catheterization.  Return to the ER for any other concerns.

## 2019-08-24 ENCOUNTER — Other Ambulatory Visit: Payer: Self-pay | Admitting: Physician Assistant

## 2019-08-24 NOTE — Telephone Encounter (Signed)
Ian Mu, PA-C  Alroy Dust.Jemarcus Dougal, RN  He was seen in the ED for worsening chest pain. High-sensitivity troponin negative x2. EKG without ischemic changes. His renal function has improved to near baseline. Dr. Fletcher Anon has said we can go ahead and proceed with his cath. Please contact patient and see if he would want to get this done this week with Dr. Fletcher Anon in [facility] (Wednesday or Friday morning) or if he would want to wait until this can be done in [place] by Dr. Fletcher Anon next Monday. Just let me know what he decides and I can place orders. Thanks for your help!    Call attempted to discuss POC. No answer. LMTCB.

## 2019-08-24 NOTE — Telephone Encounter (Signed)
Cath orders remain signed and held from previously scheduled case.

## 2019-08-24 NOTE — Telephone Encounter (Signed)
Spoke to patient. Confirmed upcoming appt and labs for cath.   Pt verbalized understanding and I told him that I would send him a copy in the mail.  No further questions at this time.   @LOGO @ [place] [address] [place] [address] Dept: [phone] Loc: [phone]  ARTURO GOETZE  08/24/2019  You are scheduled for a Cardiac Catheterization on Monday, February 1 with Dr. Kathlyn [place].  1. Please arrive at the [facility] at 9:30 AM (This time is 1 hour before your procedure to ensure your preparation). Free valet parking service is available.   Special note: Every effort is made to have your procedure done on time. Please understand that emergencies sometimes delay scheduled procedures.  2. Diet: Do not eat solid foods after midnight.  The patient may have clear liquids until 5am upon the day of the procedure.  3. Labs:   - CV19 Pre admit testing DRIVE THRU  Please report to the PAT testing site (medical arts building) on _____Thursday Jan 28th____ date ______12:30-2:30PM_____ time for your DRIVE THRU covid testing that is required prior to your procedure.  Following covid testing, please remain in quarantine. If you must be around others, please wash hands, avoid touching face and wear your mask.    4. Medication instructions in preparation for your procedure: Hold Lasix on the morning of.  Do not take Cialis 24 hours prior to procedure.   On the morning of your procedure, take your Plavix/Clopidogrel and Aspirin and any morning medicines NOT listed above.  You may use sips of water.  5. Plan for one night stay--bring personal belongings. 6. Bring a current list of your medications and current insurance cards. 7. You MUST have a responsible person to drive you home. 8. Someone MUST be with you the first 24 hours after you arrive home or your discharge will be  delayed. 9. Please wear clothes that are easy to get on and off and wear slip-on shoes.  Thank you for allowing [place] to care for you!   -- [facility]

## 2019-08-25 ENCOUNTER — Telehealth: Payer: Self-pay | Admitting: Cardiovascular Disease

## 2019-08-25 NOTE — Telephone Encounter (Signed)
Patient calling to check status of request.

## 2019-08-25 NOTE — Telephone Encounter (Signed)
Pt c/o medication issue:  1. Name of Medication: Entresto   2. How are you currently taking this medication (dosage and times per day)? holding  3. Are you having a reaction (difficulty breathing--STAT)? No   4. What is your medication issue? Patient wants to know given recent lab results if it is ok to restart entresto.  He c/o of keeping a headache

## 2019-08-26 ENCOUNTER — Other Ambulatory Visit: Payer: Self-pay

## 2019-08-26 ENCOUNTER — Other Ambulatory Visit
Admission: RE | Admit: 2019-08-26 | Discharge: 2019-08-26 | Disposition: A | Discharge status: Home / Self Care | Payer: Medicare Other | Source: Ambulatory Visit | Attending: Cardiovascular Disease | Admitting: Cardiovascular Disease

## 2019-08-26 DIAGNOSIS — Z20822 Contact with and (suspected) exposure to covid-19: Secondary | ICD-10-CM | POA: Diagnosis not present

## 2019-08-26 DIAGNOSIS — Z01812 Encounter for preprocedural laboratory examination: Secondary | ICD-10-CM | POA: Diagnosis not present

## 2019-08-26 LAB — SARS CORONAVIRUS 2 (TAT 6-24 HRS): SARS Coronavirus 2: NEGATIVE

## 2019-08-26 NOTE — Telephone Encounter (Signed)
Spoke with patient and he reports that he was instructed to hold Entresto but he developed such a bad headache "felt like my head was going to blow up" so he restarted medication. He also went to ED on Monday and was there for over 6 hours. He reports that his blood pressure was 197/101 on Monday for several hours. They did increase Carvedilol to 2 tabs twice daily but it knocks him out so he is only taking 1 twice daily due to side effects. Advised that I would make provider aware and would be in touch with any further recommendations. He verbalized understanding with no further questions at this time.

## 2019-08-26 NOTE — Telephone Encounter (Signed)
With relatively stable renal function, okay to resume Entresto in the setting of intolerance to titrated carvedilol.  This should help with his hypertension.  If his headache persists despite improvement in BP he should follow-up with PCP for further evaluation.

## 2019-08-26 NOTE — Telephone Encounter (Signed)
Spoke with patient and reviewed provider recommendations. He went to fire department this morning and they checked his blood pressure and it was 150/89. He states that it is coming back down but did mention normal range for him was 118/60's. Reviewed that provider recommends if headaches continue then to please call his PCP for further evaluation. He verbalized understanding with no further questions at this time.

## 2019-08-27 NOTE — Progress Notes (Signed)
Noted, thank you

## 2019-08-30 ENCOUNTER — Other Ambulatory Visit: Payer: Self-pay

## 2019-08-30 ENCOUNTER — Other Ambulatory Visit: Payer: Self-pay | Admitting: Pharmacy Technician

## 2019-08-30 ENCOUNTER — Encounter: Payer: Self-pay | Admitting: Cardiovascular Disease

## 2019-08-30 ENCOUNTER — Ambulatory Visit
Admission: RE | Admit: 2019-08-30 | Discharge: 2019-08-30 | Disposition: A | Discharge status: Home / Self Care | Payer: Medicare Other | Attending: Cardiovascular Disease | Admitting: Cardiovascular Disease

## 2019-08-30 ENCOUNTER — Encounter
Admission: RE | Disposition: A | Discharge status: Home / Self Care | Payer: Self-pay | Source: Home / Self Care | Attending: Cardiovascular Disease

## 2019-08-30 ENCOUNTER — Ambulatory Visit (INDEPENDENT_AMBULATORY_CARE_PROVIDER_SITE_OTHER): Payer: Medicare Other | Admitting: Pharmacist

## 2019-08-30 DIAGNOSIS — Z7902 Long term (current) use of antithrombotics/antiplatelets: Secondary | ICD-10-CM | POA: Insufficient documentation

## 2019-08-30 DIAGNOSIS — Z95810 Presence of automatic (implantable) cardiac defibrillator: Secondary | ICD-10-CM | POA: Diagnosis not present

## 2019-08-30 DIAGNOSIS — Z87891 Personal history of nicotine dependence: Secondary | ICD-10-CM | POA: Diagnosis not present

## 2019-08-30 DIAGNOSIS — I130 Hypertensive heart and chronic kidney disease with heart failure and stage 1 through stage 4 chronic kidney disease, or unspecified chronic kidney disease: Secondary | ICD-10-CM | POA: Insufficient documentation

## 2019-08-30 DIAGNOSIS — Z955 Presence of coronary angioplasty implant and graft: Secondary | ICD-10-CM | POA: Diagnosis not present

## 2019-08-30 DIAGNOSIS — I255 Ischemic cardiomyopathy: Secondary | ICD-10-CM | POA: Insufficient documentation

## 2019-08-30 DIAGNOSIS — Z8249 Family history of ischemic heart disease and other diseases of the circulatory system: Secondary | ICD-10-CM | POA: Insufficient documentation

## 2019-08-30 DIAGNOSIS — Z7951 Long term (current) use of inhaled steroids: Secondary | ICD-10-CM | POA: Diagnosis not present

## 2019-08-30 DIAGNOSIS — Z8774 Personal history of (corrected) congenital malformations of heart and circulatory system: Secondary | ICD-10-CM | POA: Diagnosis not present

## 2019-08-30 DIAGNOSIS — N183 Chronic kidney disease, stage 3 unspecified: Secondary | ICD-10-CM | POA: Diagnosis not present

## 2019-08-30 DIAGNOSIS — Z801 Family history of malignant neoplasm of trachea, bronchus and lung: Secondary | ICD-10-CM | POA: Insufficient documentation

## 2019-08-30 DIAGNOSIS — I200 Unstable angina: Secondary | ICD-10-CM

## 2019-08-30 DIAGNOSIS — Z7982 Long term (current) use of aspirin: Secondary | ICD-10-CM | POA: Insufficient documentation

## 2019-08-30 DIAGNOSIS — Z79899 Other long term (current) drug therapy: Secondary | ICD-10-CM | POA: Insufficient documentation

## 2019-08-30 DIAGNOSIS — I25119 Atherosclerotic heart disease of native coronary artery with unspecified angina pectoris: Secondary | ICD-10-CM | POA: Diagnosis not present

## 2019-08-30 DIAGNOSIS — I5022 Chronic systolic (congestive) heart failure: Secondary | ICD-10-CM | POA: Insufficient documentation

## 2019-08-30 DIAGNOSIS — E785 Hyperlipidemia, unspecified: Secondary | ICD-10-CM | POA: Diagnosis not present

## 2019-08-30 DIAGNOSIS — I25110 Atherosclerotic heart disease of native coronary artery with unstable angina pectoris: Secondary | ICD-10-CM | POA: Insufficient documentation

## 2019-08-30 DIAGNOSIS — J449 Chronic obstructive pulmonary disease, unspecified: Secondary | ICD-10-CM | POA: Diagnosis not present

## 2019-08-30 HISTORY — PX: LEFT HEART CATH AND CORONARY ANGIOGRAPHY: CATH118249

## 2019-08-30 SURGERY — LEFT HEART CATH AND CORONARY ANGIOGRAPHY
Anesthesia: Moderate Sedation | Laterality: Left

## 2019-08-30 MED ORDER — SODIUM CHLORIDE 0.9 % IV SOLN: 250.0000 mL | INTRAVENOUS | Status: DC | PRN

## 2019-08-30 MED ORDER — MIDAZOLAM HCL 2 MG/2ML IJ SOLN
INTRAMUSCULAR | Status: DC | PRN
  Administered 2019-08-30: 1 mg via INTRAVENOUS

## 2019-08-30 MED ORDER — HEPARIN (PORCINE) IN NACL 1000-0.9 UT/500ML-% IV SOLN
INTRAVENOUS | Status: AC
  Filled 2019-08-30: qty 1000

## 2019-08-30 MED ORDER — MIDAZOLAM HCL 2 MG/2ML IJ SOLN
INTRAMUSCULAR | Status: AC
  Filled 2019-08-30: qty 2

## 2019-08-30 MED ORDER — SODIUM CHLORIDE 0.9% FLUSH: 3.0000 mL | Freq: Two times a day (BID) | INTRAVENOUS | Status: DC

## 2019-08-30 MED ORDER — FENTANYL CITRATE (PF) 100 MCG/2ML IJ SOLN
INTRAMUSCULAR | Status: DC | PRN
  Administered 2019-08-30: 50 ug via INTRAVENOUS

## 2019-08-30 MED ORDER — SODIUM CHLORIDE 0.9 % WEIGHT BASED INFUSION
1.0000 mL/kg/h | INTRAVENOUS | Status: DC
  Administered 2019-08-30: 1 mL/kg/h via INTRAVENOUS

## 2019-08-30 MED ORDER — IOHEXOL 300 MG/ML  SOLN
INTRAMUSCULAR | Status: DC | PRN
  Administered 2019-08-30: 11:00:00 25 mL

## 2019-08-30 MED ORDER — FENTANYL CITRATE (PF) 100 MCG/2ML IJ SOLN
INTRAMUSCULAR | Status: AC
  Filled 2019-08-30: qty 2

## 2019-08-30 MED ORDER — ASPIRIN 81 MG PO CHEW: 81.0000 mg | CHEWABLE_TABLET | ORAL | Status: DC

## 2019-08-30 MED ORDER — HEPARIN (PORCINE) IN NACL 1000-0.9 UT/500ML-% IV SOLN
INTRAVENOUS | Status: DC | PRN
  Administered 2019-08-30: 500 mL

## 2019-08-30 MED ORDER — HEPARIN SODIUM (PORCINE) 1000 UNIT/ML IJ SOLN
INTRAMUSCULAR | Status: AC
  Filled 2019-08-30: qty 1

## 2019-08-30 MED ORDER — SODIUM CHLORIDE 0.9 % WEIGHT BASED INFUSION: 3.0000 mL/kg/h | INTRAVENOUS | Status: AC

## 2019-08-30 MED ORDER — SODIUM CHLORIDE 0.9% FLUSH: 3.0000 mL | INTRAVENOUS | Status: DC | PRN

## 2019-08-30 MED ORDER — HEPARIN SODIUM (PORCINE) 1000 UNIT/ML IJ SOLN
INTRAMUSCULAR | Status: DC | PRN
  Administered 2019-08-30: 5000 [IU] via INTRAVENOUS

## 2019-08-30 MED ORDER — VERAPAMIL HCL 2.5 MG/ML IV SOLN
INTRAVENOUS | Status: AC
  Filled 2019-08-30: qty 2

## 2019-08-30 MED ORDER — VERAPAMIL HCL 2.5 MG/ML IV SOLN
INTRAVENOUS | Status: DC | PRN
  Administered 2019-08-30: 2.5 mg via INTRA_ARTERIAL

## 2019-08-30 SURGICAL SUPPLY — 7 items
CATH INFINITI 5FR JK (CATHETERS) ×3 IMPLANT
DEVICE RAD TR BAND REGULAR (VASCULAR PRODUCTS) ×3 IMPLANT
GLIDESHEATH SLEND SS 6F .021 (SHEATH) ×3 IMPLANT
KIT MANI 3VAL PERCEP (MISCELLANEOUS) ×3 IMPLANT
PACK CARDIAC CATH (CUSTOM PROCEDURE TRAY) ×3 IMPLANT
WIRE NITINOL .018 (WIRE) ×3 IMPLANT
WIRE ROSEN-J .035X260CM (WIRE) ×3 IMPLANT

## 2019-08-30 NOTE — Patient Outreach (Signed)
[facility]) Care Management  08/30/2019  Ian Mills [birth year] [id number]    Successful call placed to patient regarding patient assistance application(s) for Stiolto with BI , HIPAA identifiers verified.   Patient informed he received the application and ask that I speak to his wife about a question he had about the application. Spoke to patient's wife, HIPAA verified. [name] inquired about a question on the application. Advised about the meaning of the question and patient's wife verbalized understanding.  Patient then wanted to speak back to me and informed me that he had been in the cath lab today. He informed that his blood work showed that the blood flow to his kidneys was reduced. He informed they made same medication changes as well such as stopping the Entresto (it has since been started back)and doubling up on the carvedilolol which caused him to "black out" (he has since gone back to original dose).. Patient inquired if he could possibly speak to embedded [place] about these medication changes as he informed he has recently gone back to how he was taking everything. He also informed he had an appointment with Dr. Rockey Situ next week. Strongly advised patient to keep that appointment.   Follow up:  Phone call made to embedded [place] to follow up with patient today if possible.  Will route note to embedded [facility] Catie Darnelle Maffucci for case closure if document(s) have not been received in the next 15 business days.    Mayra Brahm P. Bharath Bernstein, [place] [facility] [phone]

## 2019-08-30 NOTE — Patient Instructions (Signed)
Visit Information  Goals Addressed            This Visit's Progress     Patient Stated   . "I want to quit smoking" (pt-stated)       Current Barriers:  . Tobacco abuse of ~40 years; currently smoking 1/2 ppd; complicated by CAD, CHF, COPD o APPROVED for [name] assistance through 07/27/2020.  Marland Kitchen CAD s/p prior stenting; ASA 81 mg daily, clopidogrel 75 mg daily; atorvastatin 40 mg daily, ezetimibe 10 mg daily; last LDL at goal <70 given significant CV hx o Elective cath today. Patient reports that things were stable and no intervention needed . HFrEF s/p AICD placement (most recent EF 25-30%); Entresto 97/103 mg BID, carvedilol 3.125 mg BID, furosemide 40 mg daily, potassium 10 mEq daily o Recent bump in Scr led to holding Entresto and furosemide; patient self-restarted Entresto d/t hypertension/headaches and not tolerating the increased dose of carvedilol that he was put on to compensate. Renal function stabilized.  o Today, reports that he continues to take furosemide 80 mg daily, not 40 mg as prescribed.  o APPROVED for Entresto assistance through 08/12/2019 . Ventricular tachycardia: amiodarone 300 mg daily; . COPD: Recently switched to Stiolto by Dr. Patsey Berthold.  o Previously receiving Spiriva through [facility] patient assistance, working on [employer] patient portoin  Pharmacist Clinical Goal(s):  Marland Kitchen Over the next 90 days, patient will work with PharmD and provider towards tobacco cessation . Over the next 90 days, patient will work with multidisciplinary team on medication access.  Interventions: . Comprehensive medication review performed, medication list updated in electronic medical record.  . Will contact Dr. Erlene Senters, RN for instruction for furosemide prior to his appointment w/ Dr. Rockey Situ next week.  . Will outreach patient later this week as scheduled for full medication management review.   Patient Self Care Activities:  . Patient will commit to reducing tobacco  consumption  Please see past updates related to this goal by clicking on the "Past Updates" button in the selected goal         The patient verbalized understanding of instructions provided today and declined a print copy of patient instruction materials.  Plan:  - Will outreach patient later this week as scheduled  Catie Darnelle Maffucci, PharmD, [place], [place] Pharmacist [place] [phone]

## 2019-08-30 NOTE — Interval H&P Note (Signed)
Cath Lab Visit (complete for each Cath Lab visit)  Clinical Evaluation Leading to the Procedure:   ACS: No.  Non-ACS:    Anginal Classification: CCS III  Anti-ischemic medical therapy: Maximal Therapy (2 or more classes of medications)  Non-Invasive Test Results: High-risk stress test findings: cardiac mortality >3%/year  Prior CABG: No previous CABG      History and Physical Interval Note:  08/30/2019 10:42 AM  Ian Mills  has presented today for surgery, with the diagnosis of LT Cath   Unstable angina.  The various methods of treatment have been discussed with the patient and family. After consideration of risks, benefits and other options for treatment, the patient has consented to  Procedure(s): LEFT HEART CATH AND CORONARY ANGIOGRAPHY (Left) as a surgical intervention.  The patient's history has been reviewed, patient examined, no change in status, stable for surgery.  I have reviewed the patient's chart and labs.  Questions were answered to the patient's satisfaction.     Kathlyn [place]

## 2019-08-30 NOTE — Chronic Care Management (AMB) (Signed)
Chronic Care Management   Follow Up Note   08/30/2019 Name: Ian Mills MRN: [id number] DOB: [birth year]  Referred by: McLean-Scocuzza, Nino Glow, MD Reason for referral : Chronic Care Management (Medication Management)   Ian Mills is a 64 y.o. year old male who is a primary care patient of McLean-Scocuzza, Nino Glow, MD. The CCM team was consulted for assistance with chronic disease management and care coordination needs.   Contacted patient for urgent medication questions as noted by call from [employer], CPhT today.   Review of patient status, including review of consultants reports, relevant laboratory and other test results, and collaboration with appropriate care team members and the patient's provider was performed as part of comprehensive patient evaluation and provision of chronic care management services.    SDOH (Social Determinants of Health) screening performed today: Financial Strain  Stress. See Care Plan for related entries.   Outpatient Encounter Medications as of 08/30/2019  Medication Sig Note  . carvedilol (COREG) 3.125 MG tablet Take 1 tablet (3.125 mg total) by mouth 2 (two) times daily.   . finasteride (PROSCAR) 5 MG tablet Take 5 mg by mouth daily.   . furosemide (LASIX) 40 MG tablet Take 1 tablet (40 mg total) by mouth daily. 08/30/2019: 80 mg QAM  . sacubitril-valsartan (ENTRESTO) 97-103 MG Take 1 tablet by mouth 2 (two) times daily.   . varenicline ([name] STARTING MONTH PAK) 0.5 MG X 11 & 1 MG X 42 tablet Take 0.5 mg by mouth 2 (two) times daily.    Marland Kitchen albuterol (VENTOLIN HFA) 108 (90 Base) MCG/ACT inhaler Inhale 1 puff into the lungs every 6 (six) hours as needed for wheezing or shortness of breath.   . ALPRAZolam (XANAX) 0.5 MG tablet Take 1 tablet (0.5 mg total) by mouth 3 (three) times daily as needed. for anxiety (Patient taking differently: Take 0.5 mg by mouth 3 (three) times daily as needed for anxiety. )   . amiodarone (PACERONE) 200 MG tablet Take 1.5  tablets (300 mg total) by mouth daily.   Marland Kitchen aspirin 81 MG EC tablet Take 1 tablet (81 mg total) by mouth daily.   Marland Kitchen atorvastatin (LIPITOR) 40 MG tablet Take 1 tablet (40 mg total) by mouth daily.   . clopidogrel (PLAVIX) 75 MG tablet Take 1 tablet (75 mg total) by mouth daily.   Marland Kitchen ezetimibe (ZETIA) 10 MG tablet Take 1 tablet (10 mg total) by mouth daily.   Marland Kitchen ipratropium-albuterol (DUONEB) 0.5-2.5 (3) MG/3ML SOLN Take 3 mLs by nebulization every 6 (six) hours as needed. (Patient not taking: Reported on 08/19/2019)   . mometasone-formoterol (DULERA) 200-5 MCG/ACT AERO Inhale 2 puffs into the lungs 2 (two) times daily. (Patient not taking: Reported on 08/19/2019)   . nitroGLYCERIN (NITROSTAT) 0.4 MG SL tablet Place 0.4 mg under the tongue every 5 (five) minutes as needed.    . ondansetron (ZOFRAN ODT) 4 MG disintegrating tablet Take 1 tablet (4 mg total) by mouth every 8 (eight) hours as needed for nausea or vomiting.   . potassium chloride (KLOR-CON 10) 10 MEQ tablet Take 1 tablet (10 mEq total) by mouth daily.   . tadalafil (CIALIS) 10 MG tablet Take 1 tablet (10 mg total) by mouth daily as needed for erectile dysfunction (take 30 min prior to when needed.).   Marland Kitchen Tiotropium Bromide-Olodaterol (STIOLTO RESPIMAT) 2.5-2.5 MCG/ACT AERS Inhale 2 puffs into the lungs daily. (Patient not taking: Reported on 08/30/2019)   . [DISCONTINUED] 0.9 %  sodium chloride infusion    . [  DISCONTINUED] 0.9% sodium chloride infusion    . [DISCONTINUED] aspirin chewable tablet 81 mg    . [DISCONTINUED] sodium chloride flush (NS) 0.9 % injection 3 mL    . [DISCONTINUED] sodium chloride flush (NS) 0.9 % injection 3 mL     No facility-administered encounter medications on file as of 08/30/2019.     Objective:   Goals Addressed            This Visit's Progress     Patient Stated   . "I want to quit smoking" (pt-stated)       Current Barriers:  . Tobacco abuse of ~40 years; currently smoking 1/2 ppd; complicated by CAD,  CHF, COPD o APPROVED for [name] assistance through 07/27/2020.  Marland Kitchen CAD s/p prior stenting; ASA 81 mg daily, clopidogrel 75 mg daily; atorvastatin 40 mg daily, ezetimibe 10 mg daily; last LDL at goal <70 given significant CV hx o Elective cath today. Patient reports that things were stable and no intervention needed . HFrEF s/p AICD placement (most recent EF 25-30%); Entresto 97/103 mg BID, carvedilol 3.125 mg BID, furosemide 40 mg daily, potassium 10 mEq daily o Recent bump in Scr led to holding Entresto and furosemide; patient self-restarted Entresto d/t hypertension/headaches and not tolerating the increased dose of carvedilol that he was put on to compensate. Renal function stabilized.  o Today, reports that he continues to take furosemide 80 mg daily, not 40 mg as prescribed.  o APPROVED for Entresto assistance through 08/12/2019 . Ventricular tachycardia: amiodarone 300 mg daily; . COPD: Recently switched to Stiolto by Dr. Patsey Berthold.  o Previously receiving Spiriva through [facility] patient assistance, working on [employer] patient portoin  Pharmacist Clinical Goal(s):  Marland Kitchen Over the next 90 days, patient will work with PharmD and provider towards tobacco cessation . Over the next 90 days, patient will work with multidisciplinary team on medication access.  Interventions: . Comprehensive medication review performed, medication list updated in electronic medical record.  . Congratulated patient on starting [name] and commitment towards tobacco cessation. Counseled to take w/ food to avoid GI upset. Discussed that recommendation is to select a Quit Date 8-35 days after starting. Collaboratively chose August 30, 2019 as patient's quit date.  . Discussed that possibility of elective cath will be decided at appt w/ Christell Faith later this week. Discussed that I am unsure if [name] is on inpatient formulary, but he should discuss w/ his medical team and request that he bring his [name] supply from home to  continue, if he is hospitalized for elective cath. Discussed benefit of continuing [name] for ~12 weeks for best efficacy.  . Will send message to Christell Faith noting that patient has NOT been on spironolactone, to discuss restarting at upcoming appointment . Will collaborate w/ Susy Frizzle, CPhT to mail patient his portion of Stiolto patient assistance application from [employer], and fax provider portion to Dr. Beckey Downing, [place]. Once all parts received, will fax to [facility].  Marland Kitchen Counseled patient to stop Dulera d/t overlap LABA w/ Stiolto. He verbalized understanding.   Patient Self Care Activities:  . Patient will commit to reducing tobacco consumption  Please see past updates related to this goal by clicking on the "Past Updates" button in the selected goal          Plan:  - Will outreach patient later this week as scheduled  Catie Darnelle Maffucci, PharmD, [place], [name] Pharmacist [place] [phone]

## 2019-08-30 NOTE — Progress Notes (Signed)
Dr. Fletcher Anon at bedside, speaking with pt. And his spouse Ian Mills now re: cath results. Both verbalize understanding of conversation.

## 2019-08-31 ENCOUNTER — Ambulatory Visit: Payer: Medicare Other | Admitting: Physician Assistant

## 2019-09-02 ENCOUNTER — Ambulatory Visit: Payer: Medicare Other | Admitting: Pharmacist

## 2019-09-02 DIAGNOSIS — I5022 Chronic systolic (congestive) heart failure: Secondary | ICD-10-CM

## 2019-09-02 DIAGNOSIS — I255 Ischemic cardiomyopathy: Secondary | ICD-10-CM | POA: Diagnosis not present

## 2019-09-02 DIAGNOSIS — I25119 Atherosclerotic heart disease of native coronary artery with unspecified angina pectoris: Secondary | ICD-10-CM

## 2019-09-02 DIAGNOSIS — F17200 Nicotine dependence, unspecified, uncomplicated: Secondary | ICD-10-CM

## 2019-09-02 DIAGNOSIS — J449 Chronic obstructive pulmonary disease, unspecified: Secondary | ICD-10-CM

## 2019-09-02 NOTE — Patient Instructions (Signed)
Visit Information  Goals Addressed            This Visit's Progress     Patient Stated   . "I want to quit smoking" (pt-stated)       Current Barriers:  . Polypharmacy, complex patient w/ multiple comorbidities including COPD, CHF, CAD, tobacco abuse  . Tobacco abuse of ~40 years; currently smoking 2-3 cigarettes per day (down from 1/2 ppd); notes he has been on [name] 1 mg BID for ~4 days. Denies GI upset.  o APPROVED for [name] assistance through 07/27/2020.  o Patient had previously set a quit date of 2/1, but had scheduled changed d/t catheterization. Has now decided a quit date of 09/08/19.  Marland Kitchen CAD s/p prior stenting; ASA 81 mg daily, clopidogrel 75 mg daily; atorvastatin 40 mg daily, ezetimibe 10 mg daily; last LDL at goal <70 given significant CV hx . HFrEF s/p AICD placement (most recent EF 25-30%); Entresto 97/103 mg BID, carvedilol 3.125 mg BID, furosemide 80 mg daily, potassium 10 mEq daily o F/u with Dr. Rockey Situ 09/07/19 o APPROVED for Entresto assistance through 08/12/2019 . Ventricular tachycardia: amiodarone 300 mg daily; . COPD:Stiolto 2.5/2.5 mg daily, reports he has not needed albuterol HFA or nebulizer recently, since reducing his tobacco consumption o Working on [employer] patient assistance. Patient reports he and his wife mailed back the Stiolto application to CPhT. He notes he only has a few days left of Stiolto therapy.   Pharmacist Clinical Goal(s):  Marland Kitchen Over the next 90 days, patient will work with PharmD and provider towards tobacco cessation . Over the next 90 days, patient will work with multidisciplinary team on medication access.  Interventions: . Comprehensive medication review performed, medication list updated in electronic medical record.  . Reviewed upcoming February appointments.  . Discussed new Quit Date of 09/08/19. Discussed brainstorming other things to "do" instead of reaching for a cigarette (eg, gum, hard candy, chewing on licorice or cinnamon, etc).  Patient verbalized understanding. He is moderately confident in his ability to quit next week.  . Discussed progress on Stiolto patient assistance application. Will inbasket Dr. Domingo Dimes CMA to see if they can provide another sample of Stiolto to patient to bridge until assistance is completed.   Patient Self Care Activities:  . Patient will commit to reducing tobacco consumption  Please see past updates related to this goal by clicking on the "Past Updates" button in the selected goal         The patient verbalized understanding of instructions provided today and declined a print copy of patient instruction materials.    Plan:  - Scheduled f/u call 09/30/19  Catie Darnelle Maffucci, PharmD, BCACP, CPP Clinical Pharmacist [place] [phone]

## 2019-09-02 NOTE — Chronic Care Management (AMB) (Signed)
Chronic Care Management   Follow Up Note   09/02/2019 Name: Ian Mills MRN: [id number] DOB: 1955-09-03  Referred by: McLean-Scocuzza, Nino Glow, MD Reason for referral : Chronic Care Management (Medication Management)   Ian Mills is a 64 y.o. year old male who is a primary care patient of McLean-Scocuzza, Nino Glow, MD. The CCM team was consulted for assistance with chronic disease management and care coordination needs.   Contacted patient as scheduled for medication management review.   Review of patient status, including review of consultants reports, relevant laboratory and other test results, and collaboration with appropriate care team members and the patient's provider was performed as part of comprehensive patient evaluation and provision of chronic care management services.    SDOH (Social Determinants of Health) screening performed today: Financial Strain  Tobacco Use. See Care Plan for related entries.   Outpatient Encounter Medications as of 09/02/2019  Medication Sig Note  . ALPRAZolam (XANAX) 0.5 MG tablet Take 1 tablet (0.5 mg total) by mouth 3 (three) times daily as needed. for anxiety 09/02/2019: Taking 3 times daily   . amiodarone (PACERONE) 200 MG tablet Take 1.5 tablets (300 mg total) by mouth daily.   Marland Kitchen aspirin 81 MG EC tablet Take 1 tablet (81 mg total) by mouth daily.   Marland Kitchen atorvastatin (LIPITOR) 40 MG tablet Take 1 tablet (40 mg total) by mouth daily.   . carvedilol (COREG) 3.125 MG tablet Take 1 tablet (3.125 mg total) by mouth 2 (two) times daily.   . clopidogrel (PLAVIX) 75 MG tablet Take 1 tablet (75 mg total) by mouth daily.   Marland Kitchen ezetimibe (ZETIA) 10 MG tablet Take 1 tablet (10 mg total) by mouth daily.   . finasteride (PROSCAR) 5 MG tablet Take 5 mg by mouth daily.   . furosemide (LASIX) 40 MG tablet Take 1 tablet (40 mg total) by mouth daily. 08/30/2019: 80 mg QAM  . potassium chloride (KLOR-CON 10) 10 MEQ tablet Take 1 tablet (10 mEq total) by mouth daily.   .  sacubitril-valsartan (ENTRESTO) 97-103 MG Take 1 tablet by mouth 2 (two) times daily.   . Tiotropium Bromide-Olodaterol (STIOLTO RESPIMAT) 2.5-2.5 MCG/ACT AERS Inhale 2 puffs into the lungs daily.   . varenicline ([name] STARTING MONTH PAK) 0.5 MG X 11 & 1 MG X 42 tablet Take 0.5 mg by mouth 2 (two) times daily.    Marland Kitchen albuterol (VENTOLIN HFA) 108 (90 Base) MCG/ACT inhaler Inhale 1 puff into the lungs every 6 (six) hours as needed for wheezing or shortness of breath.   Marland Kitchen ipratropium-albuterol (DUONEB) 0.5-2.5 (3) MG/3ML SOLN Take 3 mLs by nebulization every 6 (six) hours as needed. (Patient not taking: Reported on 08/19/2019)   . nitroGLYCERIN (NITROSTAT) 0.4 MG SL tablet Place 0.4 mg under the tongue every 5 (five) minutes as needed.    . ondansetron (ZOFRAN ODT) 4 MG disintegrating tablet Take 1 tablet (4 mg total) by mouth every 8 (eight) hours as needed for nausea or vomiting. (Patient not taking: Reported on 09/02/2019)   . tadalafil (CIALIS) 10 MG tablet Take 1 tablet (10 mg total) by mouth daily as needed for erectile dysfunction (take 30 min prior to when needed.). (Patient not taking: Reported on 09/02/2019)   . [DISCONTINUED] mometasone-formoterol (DULERA) 200-5 MCG/ACT AERO Inhale 2 puffs into the lungs 2 (two) times daily. (Patient not taking: Reported on 08/19/2019)    No facility-administered encounter medications on file as of 09/02/2019.     Objective:   Goals Addressed  This Visit's Progress     Patient Stated   . "I want to quit smoking" (pt-stated)       Current Barriers:  . Polypharmacy, complex patient w/ multiple comorbidities including COPD, CHF, CAD, tobacco abuse  . Tobacco abuse of ~40 years; currently smoking 2-3 cigarettes per day (down from 1/2 ppd); notes he has been on [name] 1 mg BID for ~4 days. Denies GI upset.  o APPROVED for [name] assistance through 07/27/2020.  o Patient had previously set a quit date of 2/1, but had scheduled changed d/t  catheterization. Has now decided a quit date of 09/08/19.  Marland Kitchen CAD s/p prior stenting; ASA 81 mg daily, clopidogrel 75 mg daily; atorvastatin 40 mg daily, ezetimibe 10 mg daily; last LDL at goal <70 given significant CV hx . HFrEF s/p AICD placement (most recent EF 25-30%); Entresto 97/103 mg BID, carvedilol 3.125 mg BID, furosemide 80 mg daily, potassium 10 mEq daily o F/u with Dr. Rockey Situ 09/07/19 o APPROVED for Entresto assistance through 08/12/2019 . Ventricular tachycardia: amiodarone 300 mg daily; . COPD:Stiolto 2.5/2.5 mg daily, reports he has not needed albuterol HFA or nebulizer recently, since reducing his tobacco consumption o Working on [employer] patient assistance. Patient reports he and his wife mailed back the Stiolto application to CPhT. He notes he only has a few days left of Stiolto therapy.   Pharmacist Clinical Goal(s):  Marland Kitchen Over the next 90 days, patient will work with PharmD and provider towards tobacco cessation . Over the next 90 days, patient will work with multidisciplinary team on medication access.  Interventions: . Comprehensive medication review performed, medication list updated in electronic medical record.  . Reviewed upcoming February appointments.  . Discussed new Quit Date of 09/08/19. Discussed brainstorming other things to "do" instead of reaching for a cigarette (eg, gum, hard candy, chewing on licorice or cinnamon, etc). Patient verbalized understanding. He is moderately confident in his ability to quit next week.  . Discussed progress on Stiolto patient assistance application. Will inbasket Dr. Domingo Dimes CMA to see if they can provide another sample of Stiolto to patient to bridge until assistance is completed.   Patient Self Care Activities:  . Patient will commit to reducing tobacco consumption  Please see past updates related to this goal by clicking on the "Past Updates" button in the selected goal          Plan:  - Scheduled f/u call 09/30/19  Catie  Darnelle Maffucci, PharmD, BCACP, [place] Pharmacist [place] [phone]

## 2019-09-03 ENCOUNTER — Telehealth: Payer: Self-pay | Admitting: Cardiovascular Disease

## 2019-09-03 NOTE — Telephone Encounter (Signed)
Patient would like to be called about having a visitor come with him to his visit. The visitor policy was explained but patients wife did not think that was reason enough

## 2019-09-03 NOTE — Telephone Encounter (Signed)
Spoke with patient and reviewed that due to [place] our visitor policy is kind of strict and no visitors allowed if patient is of sound mind. Offered that she could be over the speaker phone during the visit in case she has any questions. He was agreeable with this plan and had no further questions at this time.

## 2019-09-06 NOTE — Progress Notes (Signed)
Date:  09/06/2019   ID:  Ian Mills, DOB 1955/12/31, MRN [id number]  Patient Location:  [place] [address]   Provider location:   [facility], [place] office  PCP:  McLean-Scocuzza, Nino Glow, MD  Cardiologist:  [facility]  Chief Complaint  Patient presents with  . other    Follow up s/p cardiac cath. Meds reviewed by the pt. verbally. Pt. c/o chest pressure.     History of Present Illness:    Ian Mills is a 64 y.o. male past medical history of Smoking, continues to smoke 1 pack a week coronary artery disease,  3 stents placed to his mid LAD, one stent to the PDA, one stent to the OM 3 in 08/2006  chest pressure during his divorce with stress test at [facility]  cardiac catheterization at [facility], 11/2011 syncope x4 episodes total over the past several years ECHO EF 25 to 30%, 2015 ICD, ejection fraction 30-35%, chronic back and hip pain He presents today for follow-up of his coronary artery disease,  Heart catheterization last month  Stress test August 10, 2019 showing fixed defects anterior wall  Heart cath 08/30/2019 1. Significant underlying three-vessel coronary artery disease with chronically occluded mid LAD, patent first diagonal stent, patent RCA stents with mild in-stent restenosis, patent left circumflex stent with moderate in-stent restenosis.  Progression of ostial left circumflex stenosis to 60%. 2.  Left ventricular angiography was not performed due to chronic kidney disease. 3.  Moderately elevated left ventricular end-diastolic pressure between 25 to 30 mmHg.  medical therapy recommended.    Since discharge from the hospital, denies significant chest pain No leg swelling, no orthopnea or shortness of breath on exertion No recent COPD exacerbation  He is concerned that Dr. Fletcher Anon mention he might have a blockage going to his kidneys No record of this can be found Catheterization images pulled  up and reviewed  Recent lab work reviewed showing creatinine up to 2.24,  Creatinine improved by holding Lasix , creatinine down to 1.5  EKG personally reviewed by myself on todays visit Shows normal sinus rhythm rate 57 bpm old anterior MI  Her past medical history reviewed Seen in the hospital February 2020 for COPD exacerbation PNA  Previously with back and sciatic pain, improved with cortisone shots, two years ago seen by [facility] for chronic back pain   Past Medical History:  Diagnosis Date  . Coronary artery disease    a. 3 stents to the LAD, 1 to the PDA, and 1 to the OM3; b. cath 06/2013: chronically occluded LAD, patent stent LAD, D1 20%, pLCx 30%, mLCx 20%, pRCA 70% (FFR 0.74) s/p PCI/DES, mRCA 70% s/p PCI/DES, RPDA 50%, EF 20% w/ AK of anterolateral & apical walls, moderately elevated LVEDP  . HFrEF (heart failure with reduced ejection fraction) ([place])    a. 05/2014 Echo: EF 25-30%; b. 01/2018 Echo ([facility]): EF 30%.  Marland Kitchen HLD (hyperlipidemia)   . HTN (hypertension)   . ICD  single,BSX    a. DOI 11/2011; b. S/N# [id number]  . Ischemic cardiomyopathy    a. 05/2014 Echo: EF 25-30%; b. 01/2018 Echo ([facility]): EF 30%, sev glob HK w/ apical/ant AK. Mild LVH. Mod LAE, mild RAE. Triv MR/TR.  Marland Kitchen Pneumonia    08/2018  . Syncope and collapse   . Ventricular tachycardia ([place]) 11/2011   a. s/p AICD implant    Past Surgical History:  Procedure Laterality  Date  . appendectomy    . CARDIAC CATHETERIZATION  Nov 30 2011   [facility]  . CARDIAC DEFIBRILLATOR PLACEMENT  May 2013  . CORONARY ANGIOPLASTY  2014   x 2 stents  . CORONARY STENT PLACEMENT     x7  . LEFT HEART CATH AND CORONARY ANGIOGRAPHY Left 08/30/2019   Procedure: LEFT HEART CATH AND CORONARY ANGIOGRAPHY;  Surgeon: Wellington Hampshire, MD;  Location: [facility];  Service: Cardiovascular;  Laterality: Left;  . PROSTATE SURGERY     urethra grew around prostate   . TUMOR REMOVAL  2001   chest thymic cyst; behind lungs [facility]  benign      No outpatient medications have been marked as taking for the 09/07/19 encounter (Appointment) with Minna Merritts, MD.     Allergies:   Nitroglycerin   Social History   Tobacco Use  . Smoking status: Former Smoker    Packs/day: 0.25    Years: 42.00    Pack years: 10.50    Types: Cigarettes    Quit date: 08/16/2019    Years since quitting: 0.0  . Smokeless tobacco: Never Used  . Tobacco comment: smoking 7 a day-08/12/2019  Substance Use Topics  . Alcohol use: Yes    Alcohol/week: 1.0 standard drinks    Types: 1 Cans of beer per week    Comment: per week  . Drug use: No     Current Outpatient Medications on File Prior to Visit  Medication Sig Dispense Refill  . albuterol (VENTOLIN HFA) 108 (90 Base) MCG/ACT inhaler Inhale 1 puff into the lungs every 6 (six) hours as needed for wheezing or shortness of breath.    . ALPRAZolam (XANAX) 0.5 MG tablet Take 1 tablet (0.5 mg total) by mouth 3 (three) times daily as needed. for anxiety 90 tablet 2  . amiodarone (PACERONE) 200 MG tablet Take 1.5 tablets (300 mg total) by mouth daily. 135 tablet 3  . aspirin 81 MG EC tablet Take 1 tablet (81 mg total) by mouth daily. 30 tablet 6  . atorvastatin (LIPITOR) 40 MG tablet Take 1 tablet (40 mg total) by mouth daily. 90 tablet 3  . carvedilol (COREG) 3.125 MG tablet Take 1 tablet (3.125 mg total) by mouth 2 (two) times daily. 180 tablet 3  . clopidogrel (PLAVIX) 75 MG tablet Take 1 tablet (75 mg total) by mouth daily. 90 tablet 3  . ezetimibe (ZETIA) 10 MG tablet Take 1 tablet (10 mg total) by mouth daily. 90 tablet 3  . finasteride (PROSCAR) 5 MG tablet Take 5 mg by mouth daily.    . furosemide (LASIX) 40 MG tablet Take 1 tablet (40 mg total) by mouth daily. 90 tablet 3  . ipratropium-albuterol (DUONEB) 0.5-2.5 (3) MG/3ML SOLN Take 3 mLs by nebulization every 6 (six) hours as needed. (Patient not taking: Reported on 08/19/2019) 360 mL 0  . nitroGLYCERIN (NITROSTAT) 0.4 MG SL tablet  Place 0.4 mg under the tongue every 5 (five) minutes as needed.     . ondansetron (ZOFRAN ODT) 4 MG disintegrating tablet Take 1 tablet (4 mg total) by mouth every 8 (eight) hours as needed for nausea or vomiting. (Patient not taking: Reported on 09/02/2019) 20 tablet 0  . potassium chloride (KLOR-CON 10) 10 MEQ tablet Take 1 tablet (10 mEq total) by mouth daily. 90 tablet 3  . sacubitril-valsartan (ENTRESTO) 97-103 MG Take 1 tablet by mouth 2 (two) times daily. 180 tablet 3  . tadalafil (CIALIS) 10 MG tablet  Take 1 tablet (10 mg total) by mouth daily as needed for erectile dysfunction (take 30 min prior to when needed.). (Patient not taking: Reported on 09/02/2019) 10 tablet 3  . Tiotropium Bromide-Olodaterol (STIOLTO RESPIMAT) 2.5-2.5 MCG/ACT AERS Inhale 2 puffs into the lungs daily. 4 g 0  . varenicline ([name] STARTING MONTH PAK) 0.5 MG X 11 & 1 MG X 42 tablet Take 0.5 mg by mouth 2 (two) times daily.      No current facility-administered medications on file prior to visit.     Family Hx: The patient's family history includes Cancer in his father; Cancer - Lung in his mother; Dementia in his maternal grandmother; Heart attack in his mother and paternal grandfather; Hypertension in his mother.  ROS:   Please see the history of present illness.    Review of Systems  Constitutional: Negative.   Respiratory: Positive for shortness of breath.   Cardiovascular: Negative.   Gastrointestinal: Negative.   Musculoskeletal: Negative.   Neurological: Negative.   Psychiatric/Behavioral: Negative.   All other systems reviewed and are negative.    Labs/Other Tests and Data Reviewed:    Recent Labs: 09/08/2018: Magnesium 2.2 06/10/2019: TSH 1.94 08/23/2019: ALT 41; BUN 21; Creatinine, Ser 1.53; Hemoglobin 14.2; Platelets 204; Potassium 4.4; Sodium 135   Recent Lipid Panel Lab Results  Component Value Date/Time   CHOL 142 06/10/2019 09:55 AM   CHOL 170 12/09/2016 11:03 AM   CHOL 111 01/20/2014  09:29 AM   TRIG 133.0 06/10/2019 09:55 AM   TRIG 88 01/20/2014 09:29 AM   HDL 46.40 06/10/2019 09:55 AM   HDL 53 12/09/2016 11:03 AM   HDL 39 (L) 01/20/2014 09:29 AM   CHOLHDL 3 06/10/2019 09:55 AM   LDLCALC 69 06/10/2019 09:55 AM   LDLCALC 94 12/09/2016 11:03 AM   LDLCALC 54 01/20/2014 09:29 AM    Wt Readings from Last 3 Encounters:  08/30/19 203 lb 0.7 oz (92.1 kg)  08/23/19 203 lb (92.1 kg)  08/18/19 203 lb (92.1 kg)     Exam:    BP 118/62 (BP Location: Left Arm, Patient Position: Sitting, Cuff Size: Normal)   Pulse (!) 57   Ht 5\' 8"  (1.727 m)   Wt 207 lb 8 oz (94.1 kg)   BMI 31.55 kg/m  Constitutional:  oriented to person, place, and time. No distress.  HENT:  Head: Grossly normal Eyes:  no discharge. No scleral icterus.  Neck: No JVD, no carotid bruits  Cardiovascular: Regular rate and rhythm, no murmurs appreciated Pulmonary/Chest: Clear to auscultation bilaterally, no wheezes or rails Abdominal: Soft.  no distension.  no tenderness.  Musculoskeletal: Normal range of motion Neurological:  normal muscle tone. Coordination normal. No atrophy Skin: Skin warm and dry Psychiatric: normal affect, pleasant   ASSESSMENT & PLAN:    Coronary artery disease involving native coronary artery of native heart without angina pectoris -  Recent heart catheterization results reviewed with him, occluded LAD, known from prior studies ICD in place Lipitor new prescription up to 80 mg daily Stay on Zetia --- At his request, renal artery ultrasound ordered Recent AKI, known PAD Reports that he was told by interventional cardiology he had renal artery obstruction/stenosis  Spinal stenosis of lumbar region, unspecified whether neurogenic claudication present -  Followed at [facility] ,  Symptoms improved after cortisone shots Stable symptoms  Ventricular tachycardia ([place]) ICD downloads reviewed  Denies any shocks Stable  Ischemic cardiomyopathy Ejection fraction  30-35%, 2015  no new symptoms Recent catheterization reviewed,  no changes to his medications  Mixed hyperlipidemia Continue Lipitor up to 80 mg daily and Zetia Goal LDL less than 70  Single implantable cardioverter-defibrillator-BSx Followed by EP.  COPD Chronic shortness of breath Now on oxygen when walking long distances He reports symptoms are stable, no recent COPD exacerbation  Acute on chronic systolic CHF (congestive heart failure) ([place])  continue Lasix , Coreg , Entresto, Aldactone Recommend he hold Lasix on the weekends given  prerenal state on Lasix 40 daily   Total encounter time more than 40 minutes  Greater than 50% was spent in counseling and coordination of care with the patient   Disposition: Follow-up in 6 months   Signed, Ida Rogue, MD  09/06/2019 6:11 PM    [place] Office [address] [place] [address], [place], [place] [address]

## 2019-09-07 ENCOUNTER — Other Ambulatory Visit: Payer: Self-pay | Admitting: Pharmacy Technician

## 2019-09-07 ENCOUNTER — Other Ambulatory Visit: Payer: Self-pay

## 2019-09-07 ENCOUNTER — Ambulatory Visit (INDEPENDENT_AMBULATORY_CARE_PROVIDER_SITE_OTHER): Payer: Medicare Other | Admitting: Cardiovascular Disease

## 2019-09-07 ENCOUNTER — Encounter: Payer: Self-pay | Admitting: Cardiovascular Disease

## 2019-09-07 VITALS — BP 118/62 | HR 57 | Ht 68.0 in | Wt 207.5 lb

## 2019-09-07 DIAGNOSIS — N179 Acute kidney failure, unspecified: Secondary | ICD-10-CM | POA: Diagnosis not present

## 2019-09-07 DIAGNOSIS — I25118 Atherosclerotic heart disease of native coronary artery with other forms of angina pectoris: Secondary | ICD-10-CM

## 2019-09-07 DIAGNOSIS — I739 Peripheral vascular disease, unspecified: Secondary | ICD-10-CM | POA: Diagnosis not present

## 2019-09-07 DIAGNOSIS — Z720 Tobacco use: Secondary | ICD-10-CM | POA: Diagnosis not present

## 2019-09-07 DIAGNOSIS — I472 Ventricular tachycardia, unspecified: Secondary | ICD-10-CM

## 2019-09-07 DIAGNOSIS — I255 Ischemic cardiomyopathy: Secondary | ICD-10-CM

## 2019-09-07 DIAGNOSIS — E785 Hyperlipidemia, unspecified: Secondary | ICD-10-CM | POA: Diagnosis not present

## 2019-09-07 DIAGNOSIS — I200 Unstable angina: Secondary | ICD-10-CM

## 2019-09-07 DIAGNOSIS — I10 Essential (primary) hypertension: Secondary | ICD-10-CM

## 2019-09-07 DIAGNOSIS — I5020 Unspecified systolic (congestive) heart failure: Secondary | ICD-10-CM | POA: Diagnosis not present

## 2019-09-07 MED ORDER — ATORVASTATIN CALCIUM 80 MG PO TABS: 80.0000 mg | ORAL_TABLET | Freq: Every day | ORAL | 3 refills | Status: DC

## 2019-09-07 NOTE — Patient Outreach (Signed)
[facility]) Care Management  09/07/2019  DEMENTRIUS ORZEL June 14, 1956 [id number]  Received both patient and provider portion(s) of patient assistance application(s) for Stiolto. Faxed3 completed application and required documents into BI.  Will follow up with company(ies) in 7-14 business days to check status of application(s).  Graciella Arment P. Christphor Groft, [place] Management [phone]

## 2019-09-07 NOTE — Patient Instructions (Addendum)
Medication Instructions:  Your physician has recommended you make the following change in your medication:  1. SKIP Furosemide (Lasix) on the weekends 2. START Atorvastatin (Lipitor) 80 mg once daily   If you need a refill on your cardiac medications before your next appointment, please call your pharmacy.    Lab work: No new labs needed   If you have labs (blood work) drawn today and your tests are completely normal, you will receive your results only by: Marland Kitchen MyChart Message (if you have MyChart) OR . A paper copy in the mail If you have any lab test that is abnormal or we need to change your treatment, we will call you to review the results.   Testing/Procedures: Renal artery u/s for AKI, PAD  Your physician has requested that you have a renal artery duplex. During this test, an ultrasound is used to evaluate blood flow to the kidneys. Allow one hour for this exam. Do not eat after midnight the day before and avoid carbonated beverages. Take your medications as you usually do.     Follow-Up: At [facility], you and your health needs are our priority.  As part of our continuing mission to provide you with exceptional heart care, we have created designated Provider Care Teams.  These Care Teams include your primary Cardiologist (physician) and Advanced Practice Providers (APPs -  Physician Assistants and Nurse Practitioners) who all work together to provide you with the care you need, when you need it.  . You will need a follow up appointment in 12 months   . Providers on your designated Care Team:   . Murray Hodgkins, NP . Christell Faith, PA-C . Marrianne Mood, PA-C  Any Other Special Instructions Will Be Listed Below (If Applicable).  For educational health videos Log in to : [email or web address] Or : [email or web address], password : [email or web address]

## 2019-09-14 ENCOUNTER — Ambulatory Visit (INDEPENDENT_AMBULATORY_CARE_PROVIDER_SITE_OTHER): Payer: Medicare Other | Admitting: *Deleted

## 2019-09-14 DIAGNOSIS — I5022 Chronic systolic (congestive) heart failure: Secondary | ICD-10-CM

## 2019-09-14 LAB — CUP PACEART REMOTE DEVICE CHECK
Battery Remaining Longevity: 66 mo
Battery Remaining Percentage: 71 %
Brady Statistic RV Percent Paced: 0 %
Date Time Interrogation Session: 20210216022100
HighPow Impedance: 85 Ohm
Implantable Lead Implant Date: 20130503
Implantable Lead Location: 753860
Implantable Lead Model: 292
Implantable Lead Serial Number: 112568
Implantable Pulse Generator Implant Date: 20130503
Lead Channel Impedance Value: 621 Ohm
Lead Channel Pacing Threshold Amplitude: 0.8 V
Lead Channel Pacing Threshold Pulse Width: 0.5 ms
Lead Channel Setting Pacing Amplitude: 2.4 V
Lead Channel Setting Pacing Pulse Width: 0.5 ms
Lead Channel Setting Sensing Sensitivity: 0.6 mV
Pulse Gen Serial Number: 105232

## 2019-09-15 NOTE — Progress Notes (Signed)
ICD Remote  

## 2019-09-22 ENCOUNTER — Other Ambulatory Visit: Payer: Self-pay | Admitting: Pharmacy Technician

## 2019-09-22 NOTE — Patient Outreach (Signed)
Ian Mills) Care Management  09/22/2019  Ian Mills 23-Oct-1955 [id number]  Care coordination call placed to [name] in regards to patient's application for Stiolto.  Spoke to [place] who informed patient was APPROVED 09/13/2019-07/28/2020. She informed patient would be receiving a 90 days supply of medication She informed based on tracking information the medication is set to be delivered Friday 09/24/2019 by the [place].  Will follow up with patient in 5-7 business days to confirm receipt of medication and to discuss refill process.  Atwood Adcock P. Chamya Hunton, [place] Management [phone]

## 2019-09-30 ENCOUNTER — Other Ambulatory Visit: Payer: Self-pay | Admitting: Pharmacy Technician

## 2019-09-30 ENCOUNTER — Ambulatory Visit (INDEPENDENT_AMBULATORY_CARE_PROVIDER_SITE_OTHER): Payer: Medicare Other | Admitting: Pharmacist

## 2019-09-30 DIAGNOSIS — I255 Ischemic cardiomyopathy: Secondary | ICD-10-CM | POA: Diagnosis not present

## 2019-09-30 DIAGNOSIS — I25119 Atherosclerotic heart disease of native coronary artery with unspecified angina pectoris: Secondary | ICD-10-CM | POA: Diagnosis not present

## 2019-09-30 DIAGNOSIS — F17200 Nicotine dependence, unspecified, uncomplicated: Secondary | ICD-10-CM

## 2019-09-30 DIAGNOSIS — E782 Mixed hyperlipidemia: Secondary | ICD-10-CM | POA: Diagnosis not present

## 2019-09-30 NOTE — Patient Outreach (Signed)
[facility]) Care Management  09/30/2019  Ian Mills [birth year] [id number]     Successful call placed to patient regarding patient assistance medication delivery of Stiolto with BI, HIPAA identifiers verified.   Patient confirmed receiving a 90 days supply of medication. Discussed refill procedure with patient. Provided patient phone number to BI to place his refills approximately 2 weeks before running out of medication. Confirmed patient had name and number as he had no other questions or concerns.  Follow up:  Will route note to embedded [place] for case closure as patient assistance has been completed. Will remove myself from care team.  Luiz Ochoa. Reather Steller, [place]  [phone]

## 2019-09-30 NOTE — Chronic Care Management (AMB) (Signed)
Chronic Care Management   Follow Up Note   09/30/2019 Name: Ian Mills MRN: [id number] DOB: [birth year]  Referred by: McLean-Scocuzza, Nino Glow, MD Reason for referral : Chronic Care Management (Medication Management)   Ian Mills is a 64 y.o. year old male who is a primary care patient of McLean-Scocuzza, Nino Glow, MD. The CCM team was consulted for assistance with chronic disease management and care coordination needs.    Contacted patient for medication management review.   Review of patient status, including review of consultants reports, relevant laboratory and other test results, and collaboration with appropriate care team members and the patient's provider was performed as part of comprehensive patient evaluation and provision of chronic care management services.    SDOH (Social Determinants of Health) assessments performed: Yes See Care Plan activities for detailed interventions related to SDOH)  SDOH Interventions     Most Recent Value  SDOH Interventions  SDOH Interventions for the Following Domains  Tobacco, Financial Strain  Financial Strain Interventions  Other (Comment) [patient assistance]  Tobacco Interventions  Cessation Materials Given and Reviewed, Other (Comment) [patient assistance for [name]]       Outpatient Encounter Medications as of 09/30/2019  Medication Sig Note  . ALPRAZolam (XANAX) 0.5 MG tablet Take 1 tablet (0.5 mg total) by mouth 3 (three) times daily as needed. for anxiety 09/02/2019: Taking 3 times daily   . amiodarone (PACERONE) 200 MG tablet Take 1.5 tablets (300 mg total) by mouth daily.   Marland Kitchen atorvastatin (LIPITOR) 80 MG tablet Take 1 tablet (80 mg total) by mouth daily.   . carvedilol (COREG) 3.125 MG tablet Take 1 tablet (3.125 mg total) by mouth 2 (two) times daily.   . clopidogrel (PLAVIX) 75 MG tablet Take 1 tablet (75 mg total) by mouth daily.   Marland Kitchen ezetimibe (ZETIA) 10 MG tablet Take 1 tablet (10 mg total) by mouth daily.   . finasteride  (PROSCAR) 5 MG tablet Take 5 mg by mouth daily.   . furosemide (LASIX) 40 MG tablet Take 1 tablet (40 mg total) by mouth daily. 09/30/2019: 40 mg twice daily   . potassium chloride (KLOR-CON 10) 10 MEQ tablet Take 1 tablet (10 mEq total) by mouth daily.   . sacubitril-valsartan (ENTRESTO) 97-103 MG Take 1 tablet by mouth 2 (two) times daily.   . Tiotropium Bromide-Olodaterol (STIOLTO RESPIMAT) 2.5-2.5 MCG/ACT AERS Inhale 2 puffs into the lungs daily.   . varenicline ([name] STARTING MONTH PAK) 0.5 MG X 11 & 1 MG X 42 tablet Take 0.5 mg by mouth 2 (two) times daily.    Marland Kitchen albuterol (VENTOLIN HFA) 108 (90 Base) MCG/ACT inhaler Inhale 1 puff into the lungs every 6 (six) hours as needed for wheezing or shortness of breath.   Marland Kitchen aspirin 81 MG EC tablet Take 1 tablet (81 mg total) by mouth daily. (Patient not taking: Reported on 09/30/2019)   . ipratropium-albuterol (DUONEB) 0.5-2.5 (3) MG/3ML SOLN Take 3 mLs by nebulization every 6 (six) hours as needed. (Patient not taking: Reported on 08/19/2019)   . nitroGLYCERIN (NITROSTAT) 0.4 MG SL tablet Place 0.4 mg under the tongue every 5 (five) minutes as needed.    . ondansetron (ZOFRAN ODT) 4 MG disintegrating tablet Take 1 tablet (4 mg total) by mouth every 8 (eight) hours as needed for nausea or vomiting.   . tadalafil (CIALIS) 10 MG tablet Take 1 tablet (10 mg total) by mouth daily as needed for erectile dysfunction (take 30 min prior to when needed.). (  Patient not taking: Reported on 09/02/2019)    No facility-administered encounter medications on file as of 09/30/2019.     Objective:   Goals Addressed            This Visit's Progress     Patient Stated   . "I want to quit smoking" (pt-stated)       CARE PLAN ENTRY (see longtitudinal plan of care for additional care plan information)  Current Barriers:  . Polypharmacy, complex patient w/ multiple comorbidities including COPD, CHF, CAD, tobacco abuse  . Tobacco abuse of ~40 years; currently smoking  0-2 cigarettes daily (has had 2 days where he didn't smoke at all); "i'm knocking on the door of quitting, but my hand keeps slipping on the knob". Discussed that stressors have made it difficult for him o APPROVED for [name] assistance through 07/27/2020 . CAD s/p prior stenting; ASA 81 mg daily- notes he isn't taking regularly right now, clopidogrel 75 mg daily; atorvastatin 80 mg daily, ezetimibe 10 mg daily;  . HFrEF s/p AICD placement (most recent EF 25-30%); Entresto 97/103 mg BID, carvedilol 3.125 mg BID, furosemide 80 mg daily, potassium 10 mEq daily o APPROVED for Entresto assistance through 08/12/2019 . Ventricular tachycardia: amiodarone 300 mg daily; . COPD: Stiolto 2.5/2.5 mg daily 2 puffs daily reports he has not needed albuterol HFA or nebulizer recently, since reducing his tobacco consumption o APPROVED for Stiolto assistance through 07/28/20  Pharmacist Clinical Goal(s):  Marland Kitchen Over the next 90 days, patient will work with PharmD and provider towards tobacco cessation . Over the next 90 days, patient will work with multidisciplinary team on medication access.  Interventions: . Comprehensive medication review performed, medication list updated in electronic medical record.  . Praised patient for commitment to tobacco cessation. Discussed strategies to "distract" himself when he has cravings. He notes that it is difficult not to snack more, but he is trying. He notes he feels better and breathes better with less tobacco consumption . Will outreach Dr. Rockey Situ to determine if patient still needs DAPT or if clopidogrel monotherapy is appropriate. Will relay decision to patient.  . Reviewed upcoming appointments.   Patient Self Care Activities:  . Patient will commit to reducing tobacco consumption  Please see past updates related to this goal by clicking on the "Past Updates" button in the selected goal          Plan:  - Scheduled f/u call 11/11/19  Catie Darnelle Maffucci, PharmD, BCACP,  [place] Pharmacist [place] [phone]

## 2019-09-30 NOTE — Patient Instructions (Signed)
Visit Information  Goals Addressed            This Visit's Progress     Patient Stated   . "I want to quit smoking" (pt-stated)       CARE PLAN ENTRY (see longtitudinal plan of care for additional care plan information)  Current Barriers:  . Polypharmacy, complex patient w/ multiple comorbidities including COPD, CHF, CAD, tobacco abuse  . Tobacco abuse of ~40 years; currently smoking 0-2 cigarettes daily (has had 2 days where he didn't smoke at all); "i'm knocking on the door of quitting, but my hand keeps slipping on the knob". Discussed that stressors have made it difficult for him o APPROVED for [name] assistance through 07/27/2020 . CAD s/p prior stenting; ASA 81 mg daily- notes he isn't taking regularly right now, clopidogrel 75 mg daily; atorvastatin 80 mg daily, ezetimibe 10 mg daily;  . HFrEF s/p AICD placement (most recent EF 25-30%); Entresto 97/103 mg BID, carvedilol 3.125 mg BID, furosemide 80 mg daily, potassium 10 mEq daily o APPROVED for Entresto assistance through 08/12/2019 . Ventricular tachycardia: amiodarone 300 mg daily; . COPD: Stiolto 2.5/2.5 mg daily 2 puffs daily reports he has not needed albuterol HFA or nebulizer recently, since reducing his tobacco consumption o APPROVED for Stiolto assistance through 07/28/20  Pharmacist Clinical Goal(s):  Marland Kitchen Over the next 90 days, patient will work with PharmD and provider towards tobacco cessation . Over the next 90 days, patient will work with multidisciplinary team on medication access.  Interventions: . Comprehensive medication review performed, medication list updated in electronic medical record.  . Praised patient for commitment to tobacco cessation. Discussed strategies to "distract" himself when he has cravings. He notes that it is difficult not to snack more, but he is trying. He notes he feels better and breathes better with less tobacco consumption . Will outreach Dr. Rockey Situ to determine if patient still needs  DAPT or if clopidogrel monotherapy is appropriate. Will relay decision to patient.  . Reviewed upcoming appointments.   Patient Self Care Activities:  . Patient will commit to reducing tobacco consumption  Please see past updates related to this goal by clicking on the "Past Updates" button in the selected goal         Patient verbalizes understanding of instructions provided today.    Plan:  - Scheduled f/u call 11/11/19  Catie Darnelle Maffucci, PharmD, BCACP, CPP Clinical Pharmacist [place] [phone]

## 2019-10-05 ENCOUNTER — Other Ambulatory Visit: Payer: Self-pay | Admitting: Cardiovascular Disease

## 2019-10-05 DIAGNOSIS — N179 Acute kidney failure, unspecified: Secondary | ICD-10-CM

## 2019-10-12 ENCOUNTER — Telehealth (INDEPENDENT_AMBULATORY_CARE_PROVIDER_SITE_OTHER): Payer: Medicare Other | Admitting: Internal Medicine

## 2019-10-12 ENCOUNTER — Encounter: Payer: Self-pay | Admitting: Internal Medicine

## 2019-10-12 VITALS — BP 118/62 | Ht 68.0 in | Wt 207.5 lb

## 2019-10-12 DIAGNOSIS — Z1389 Encounter for screening for other disorder: Secondary | ICD-10-CM | POA: Diagnosis not present

## 2019-10-12 DIAGNOSIS — I5022 Chronic systolic (congestive) heart failure: Secondary | ICD-10-CM | POA: Diagnosis not present

## 2019-10-12 DIAGNOSIS — Z720 Tobacco use: Secondary | ICD-10-CM | POA: Diagnosis not present

## 2019-10-12 DIAGNOSIS — F419 Anxiety disorder, unspecified: Secondary | ICD-10-CM

## 2019-10-12 DIAGNOSIS — I10 Essential (primary) hypertension: Secondary | ICD-10-CM | POA: Diagnosis not present

## 2019-10-12 DIAGNOSIS — I25119 Atherosclerotic heart disease of native coronary artery with unspecified angina pectoris: Secondary | ICD-10-CM

## 2019-10-12 DIAGNOSIS — R7303 Prediabetes: Secondary | ICD-10-CM | POA: Diagnosis not present

## 2019-10-12 MED ORDER — ALPRAZOLAM 0.5 MG PO TABS: 0.5000 mg | ORAL_TABLET | Freq: Three times a day (TID) | ORAL | 2 refills | Status: DC | PRN

## 2019-10-12 NOTE — Progress Notes (Signed)
Virtual Visit via Video Note  I connected with Ian Mills   on 10/12/19 at  3:20 PM EDT by a video enabled telemedicine application and verified that I am speaking with the correct person using two identifiers.  Location patient: home Location provider:work or home office Persons participating in the virtual visit: patient, provider  I discussed the limitations of evaluation and management by telemedicine and the availability of in person appointments. The patient expressed understanding and agreed to proceed.   HPI: 1. Chest pain resolved since 08/2019 and doing well wants to know if should be on aspirin and plavix will [facility] cardiology  2. Urology f/u his urologist in [place] is retiring as of 09/2019 and he requests new urologist will refer to [facility] in 03/2020 he will call back due to f/u 05/2020  3. Chronic systolic CHF/ischemic cardiomyopathy with defibrillator/HTN on lasix 40 mg qd, coreg 3.125 mg bid, entresto he is having renal artery [place] 10/13/19  His BP has been ranging 114-116/66-72 on meds  Est. Dr. Rockey Situ doing well for now did have CP see #1 resolved  4. Tobacco abuse cut back 1 pk will last 8 days on chantix and grateful approved for pt assistance program  Congratulated    ROS: See pertinent positives and negatives per HPI. Weight down 8 lbs since doing atkins diet and trying  Denies CP   Past Medical History:  Diagnosis Date  . Coronary artery disease    a. 3 stents to the LAD, 1 to the PDA, and 1 to the OM3; b. cath 06/2013: chronically occluded LAD, patent stent LAD, D1 20%, pLCx 30%, mLCx 20%, pRCA 70% (FFR 0.74) s/p PCI/DES, mRCA 70% s/p PCI/DES, RPDA 50%, EF 20% w/ AK of anterolateral & apical walls, moderately elevated LVEDP  . HFrEF (heart failure with reduced ejection fraction) ([place])    a. 05/2014 Echo: EF 25-30%; b. 01/2018 Echo ([facility]): EF 30%.  Marland Kitchen HLD (hyperlipidemia)   . HTN (hypertension)   . ICD  single,BSX    a. DOI 11/2011; b. S/N# [id number]  .  Ischemic cardiomyopathy    a. 05/2014 Echo: EF 25-30%; b. 01/2018 Echo ([facility]): EF 30%, sev glob HK w/ apical/ant AK. Mild LVH. Mod LAE, mild RAE. Triv MR/TR.  Marland Kitchen Pneumonia    08/2018  . Syncope and collapse   . Ventricular tachycardia ([place]) 11/2011   a. s/p AICD implant     Past Surgical History:  Procedure Laterality Date  . appendectomy    . CARDIAC CATHETERIZATION  Nov 30 2011   [facility]  . CARDIAC DEFIBRILLATOR PLACEMENT  May 2013  . CORONARY ANGIOPLASTY  2014   x 2 stents  . CORONARY STENT PLACEMENT     x7  . LEFT HEART CATH AND CORONARY ANGIOGRAPHY Left 08/30/2019   Procedure: LEFT HEART CATH AND CORONARY ANGIOGRAPHY;  Surgeon: Wellington Hampshire, MD;  Location: [facility];  Service: Cardiovascular;  Laterality: Left;  . PROSTATE SURGERY     urethra grew around prostate   . TUMOR REMOVAL  2001   chest thymic cyst; behind lungs [facility] benign     Family History  Problem Relation Age of Onset  . Cancer Father        larynx  . Cancer - Lung Mother   . Hypertension Mother   . Heart attack Mother   . Dementia Maternal Grandmother   . 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  ALPRAZolam (XANAX) 0.5 MG tablet, Take 1 tablet (0.5 mg total) by mouth 3 (three) times daily as needed. for anxiety, Disp: 90 tablet, Rfl: 2 .  amiodarone (PACERONE) 200 MG tablet, Take 1.5 tablets (300 mg total) by mouth daily., Disp: 135 tablet, Rfl: 3 .  atorvastatin (LIPITOR) 80 MG tablet, Take 1 tablet (80 mg total) by mouth daily., Disp: 90 tablet, Rfl: 3 .  carvedilol (COREG) 3.125 MG tablet, Take 1 tablet (3.125 mg total) by mouth 2 (two) times daily., Disp: 180 tablet, Rfl: 3 .  clopidogrel (PLAVIX) 75 MG tablet, Take 1 tablet (75 mg total) by mouth daily., Disp: 90  tablet, Rfl: 3 .  ezetimibe (ZETIA) 10 MG tablet, Take 1 tablet (10 mg total) by mouth daily., Disp: 90 tablet, Rfl: 3 .  finasteride (PROSCAR) 5 MG tablet, Take 5 mg by mouth daily., Disp: , Rfl:  .  furosemide (LASIX) 40 MG tablet, Take 1 tablet (40 mg total) by mouth daily., Disp: 90 tablet, Rfl: 3 .  nitroGLYCERIN (NITROSTAT) 0.4 MG SL tablet, Place 0.4 mg under the tongue every 5 (five) minutes as needed. , Disp: , Rfl:  .  potassium chloride (KLOR-CON 10) 10 MEQ tablet, Take 1 tablet (10 mEq total) by mouth daily., Disp: 90 tablet, Rfl: 3 .  sacubitril-valsartan (ENTRESTO) 97-103 MG, Take 1 tablet by mouth 2 (two) times daily., Disp: 180 tablet, Rfl: 3 .  tadalafil (CIALIS) 10 MG tablet, Take 1 tablet (10 mg total) by mouth daily as needed for erectile dysfunction (take 30 min prior to when needed.)., Disp: 10 tablet, Rfl: 3 .  Tiotropium Bromide-Olodaterol (STIOLTO RESPIMAT) 2.5-2.5 MCG/ACT AERS, Inhale 2 puffs into the lungs daily., Disp: 4 g, Rfl: 0 .  varenicline ([name] STARTING MONTH PAK) 0.5 MG X 11 & 1 MG X 42 tablet, Take 0.5 mg by mouth 2 (two) times daily. , Disp: , Rfl:  .  albuterol (VENTOLIN HFA) 108 (90 Base) MCG/ACT inhaler, Inhale 1 puff into the lungs every 6 (six) hours as needed for wheezing or shortness of breath., Disp: , Rfl:  .  aspirin 81 MG EC tablet, Take 1 tablet (81 mg total) by mouth daily. (Patient not taking: Reported on 10/12/2019), Disp: 30 tablet, Rfl: 6 .  ipratropium-albuterol (DUONEB) 0.5-2.5 (3) MG/3ML SOLN, Take 3 mLs by nebulization every 6 (six) hours as needed. (Patient not taking: Reported on 08/19/2019), Disp: 360 mL, Rfl: 0  EXAM:  VITALS per patient if applicable:  GENERAL: alert, oriented, appears well and in no acute distress  HEENT: atraumatic, conjunttiva clear, no obvious abnormalities on inspection of external nose and ears  NECK: normal movements of the head and neck  LUNGS: on inspection no signs of respiratory distress, breathing  rate appears normal, no obvious gross SOB, gasping or wheezing  CV: no obvious cyanosis  MS: moves all visible extremities without noticeable abnormality  PSYCH/NEURO: pleasant and cooperative, no obvious depression or anxiety, speech and thought processing grossly intact  ASSESSMENT AND PLAN:  Discussed the following assessment and plan: Coronary artery disease involving native coronary artery of native heart with angina pectoris (HCC) Chronic systolic congestive heart failure (HCC) HTN Cont meds f/u cards   Tobacco abuse cut back 1 pd lasts 8 days on chantix doing better with smoking congratulated   Anxiety -  Plan: ALPRAZolam (XANAX) 0.5 MG tablet  HM Flu shot utd  pna 23 utd  prevnar due  Tdap check [facility] per pt had in 03/2019  Consider shingrix in the future  Pt is considering covid 19 vaccine   hcv 06/29/97 negative   Skin no issues  Colonoscopy due pt will consider in the future   PSA sees Dr. Vernice [facility] urology appt sch 05/2020 he is retiring 09/2019  -will refer 03/2020 for appt 05/2020 with [facility] Dr. Vicenta Dunning   Smoker CT chest 01/19/19 COPD still smoking rec cessation   smoking <1 ppd some times <1/2 ppd or 5-6 cig qd since age 44 y.o smoker and quit x 5 year FH lung cancer in mother trying to get help with chantix   06/29/2019 [facility] cerumen impaction hearing loss with hearing aids improved after wax removal    Cards Dr. Rockey Situ and EP Dr. Jolyn Nap   -we discussed possible serious and likely etiologies, options for evaluation and workup, limitations of telemedicine visit vs in person visit, treatment, treatment risks and precautions. Pt prefers to treat via telemedicine empirically rather then risking or undertaking an in person visit at this moment. Patient agrees to seek prompt in person care if worsening, new symptoms arise, or if is not improving with treatment.   I discussed the assessment and treatment plan with the patient. The  patient was provided an opportunity to ask questions and all were answered. The patient agreed with the plan and demonstrated an understanding of the instructions.   The patient was advised to call back or seek an in-person evaluation if the symptoms worsen or if the condition fails to improve as anticipated.  Time spent 20 minutes  Delorise Jackson, MD

## 2019-10-13 DIAGNOSIS — R7303 Prediabetes: Secondary | ICD-10-CM | POA: Insufficient documentation

## 2019-10-13 DIAGNOSIS — I10 Essential (primary) hypertension: Secondary | ICD-10-CM | POA: Insufficient documentation

## 2019-10-13 NOTE — Patient Instructions (Addendum)
COVID-19 Vaccine Information can be found at: [email or web address].uk For questions related to vaccine distribution or appointments, please email [email or web address] or call [phone].   Consider covid 19 vaccine with [facility] or call the phone #s listed   [phone]  [phone] in [place]   [phone] in [place]  [phone] in [place] to [place] for your labs see lab forms 11/29/19

## 2019-10-15 ENCOUNTER — Telehealth: Payer: Self-pay | Admitting: Internal Medicine

## 2019-10-15 NOTE — Telephone Encounter (Signed)
Mailed patient lab orders for 11/29/19 and AVS. Confirmed address with pt and informed him.

## 2019-10-18 ENCOUNTER — Encounter: Payer: Self-pay | Admitting: Pulmonary Disease

## 2019-10-18 ENCOUNTER — Ambulatory Visit: Payer: Self-pay | Admitting: Pharmacist

## 2019-10-18 ENCOUNTER — Ambulatory Visit (INDEPENDENT_AMBULATORY_CARE_PROVIDER_SITE_OTHER): Payer: Medicare Other | Admitting: Pulmonary Disease

## 2019-10-18 DIAGNOSIS — J449 Chronic obstructive pulmonary disease, unspecified: Secondary | ICD-10-CM

## 2019-10-18 DIAGNOSIS — F17201 Nicotine dependence, unspecified, in remission: Secondary | ICD-10-CM | POA: Diagnosis not present

## 2019-10-18 DIAGNOSIS — I25119 Atherosclerotic heart disease of native coronary artery with unspecified angina pectoris: Secondary | ICD-10-CM

## 2019-10-18 NOTE — Progress Notes (Signed)
   Subjective:    Patient ID: Ian Mills, male    DOB: 1956/03/12, 64 y.o.   MRN: [id number] Virtual Visit Via Video or Telephone Note:   This visit type was conducted due to national recommendations for restrictions regarding the COVID-19 pandemic .  This format is felt to be most appropriate for this patient at this time.  All issues noted in this document were discussed and addressed.  No physical exam was performed (except for noted visual exam findings with Video Visits).    I connected with Ian Mills by telephone at 11:15 AM and verified that I was speaking with the correct person using two identifiers. Location patient: home Location provider: [facility] Persons participating in the virtual visit: patient, physician   I discussed the limitations, risks, security and privacy concerns of performing an evaluation and management service by telephone and the availability of in person appointments. The patient expressed understanding and agreed to proceed.  HPI Patient is a 64 year old recent former smoker who follows for the issue of COPD with chronic bronchitis.  Last visit he has had left heart catheterization and this shows significant underlying three-vessel coronary artery disease.  He is being followed by cardiology for this issue.  Noted also to have moderately elevated left ventricular end-diastolic pressure between 25 to 30 mmHg is on cardiac medications and diuretics as directed by cardiology.  If he cannot cardiomyopathy with ICD in place.  With regards to the COPD, the patient has been doing well on Stiolto.  He notes marked decrease in the use of albuterol since being on Stiolto.  He is engaged in smoking cessation and is on [name].  He has been doing well with this.  He has not had any fevers, chills or sweats.  No other symptomatology.  He voices no other complaint.   Review of Systems A 10 point review of systems was performed and it is as noted  above otherwise negative.    Objective:   Physical Exam  No physical exam performed as the visit was via telephone.  Patient did not exhibit conversational dyspnea during the visit.      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  He is uncertain as to which vaccination he will get.  We will see him in follow-up in 4 months time as noted above.  He is to contact us prior to that time should any new difficulties arise.  Visit time 12 minutes.  Ian Don, MD [facility]   *This note was dictated using voice recognition software/Dragon.  Despite best efforts to proofread, errors can occur which can change the meaning.  Any change was purely unintentional.

## 2019-10-18 NOTE — Chronic Care Management (AMB) (Signed)
Chronic Care Management   Follow Up Note   10/18/2019 Name: SAMUAL HARI MRN: [id number] DOB: 1956-06-26  Referred by: McLean-Scocuzza, Nino Glow, MD Reason for referral : Chronic Care Management (Medication Management)   WOODY ORY is a 64 y.o. year old male who is a primary care patient of McLean-Scocuzza, Nino Glow, MD. The CCM team was consulted for assistance with chronic disease management and care coordination needs.    Care coordination completed today.  Review of patient status, including review of consultants reports, relevant laboratory and other test results, and collaboration with appropriate care team members and the patient's provider was performed as part of comprehensive patient evaluation and provision of chronic care management services.    SDOH (Social Determinants of Health) assessments performed: No See Care Plan activities for detailed interventions related to [facility])     Outpatient Encounter Medications as of 10/18/2019  Medication Sig Note  . albuterol (VENTOLIN HFA) 108 (90 Base) MCG/ACT inhaler Inhale 1 puff into the lungs every 6 (six) hours as needed for wheezing or shortness of breath.   . ALPRAZolam (XANAX) 0.5 MG tablet Take 1 tablet (0.5 mg total) by mouth 3 (three) times daily as needed. for anxiety   . amiodarone (PACERONE) 200 MG tablet Take 1.5 tablets (300 mg total) by mouth daily.   Marland Kitchen aspirin 81 MG EC tablet Take 1 tablet (81 mg total) by mouth daily. (Patient not taking: Reported on 10/12/2019)   . atorvastatin (LIPITOR) 80 MG tablet Take 1 tablet (80 mg total) by mouth daily.   . carvedilol (COREG) 3.125 MG tablet Take 1 tablet (3.125 mg total) by mouth 2 (two) times daily.   . clopidogrel (PLAVIX) 75 MG tablet Take 1 tablet (75 mg total) by mouth daily.   Marland Kitchen ezetimibe (ZETIA) 10 MG tablet Take 1 tablet (10 mg total) by mouth daily.   . finasteride (PROSCAR) 5 MG tablet Take 5 mg by mouth daily.   . furosemide (LASIX) 40 MG tablet Take 1 tablet  (40 mg total) by mouth daily. 09/30/2019: 40 mg twice daily   . ipratropium-albuterol (DUONEB) 0.5-2.5 (3) MG/3ML SOLN Take 3 mLs by nebulization every 6 (six) hours as needed. (Patient not taking: Reported on 08/19/2019)   . nitroGLYCERIN (NITROSTAT) 0.4 MG SL tablet Place 0.4 mg under the tongue every 5 (five) minutes as needed.    . potassium chloride (KLOR-CON 10) 10 MEQ tablet Take 1 tablet (10 mEq total) by mouth daily.   . sacubitril-valsartan (ENTRESTO) 97-103 MG Take 1 tablet by mouth 2 (two) times daily.   . tadalafil (CIALIS) 10 MG tablet Take 1 tablet (10 mg total) by mouth daily as needed for erectile dysfunction (take 30 min prior to when needed.).   Marland Kitchen Tiotropium Bromide-Olodaterol (STIOLTO RESPIMAT) 2.5-2.5 MCG/ACT AERS Inhale 2 puffs into the lungs daily.   . varenicline ([name] STARTING MONTH PAK) 0.5 MG X 11 & 1 MG X 42 tablet Take 0.5 mg by mouth 2 (two) times daily.     No facility-administered encounter medications on file as of 10/18/2019.     Objective:   Goals Addressed            This Visit's Progress     Patient Stated   . "I want to quit smoking" (pt-stated)       CARE PLAN ENTRY (see longtitudinal plan of care for additional care plan information)  Current Barriers:  . Polypharmacy, complex patient w/ multiple comorbidities including COPD, CHF, CAD, tobacco abuse  .  Tobacco abuse of ~40 years; currently smoking 0-2 cigarettes daily (has had 2 days where he didn't smoke at all); "i'm knocking on the door of quitting, but my hand keeps slipping on the knob". Discussed that stressors have made it difficult for him o APPROVED for [name] assistance through 07/27/2020 . CAD s/p prior stenting; ASA 81 mg daily- notes he isn't taking regularly right now, clopidogrel 75 mg daily; atorvastatin 80 mg daily, ezetimibe 10 mg daily;  . HFrEF s/p AICD placement (most recent EF 25-30%); Entresto 97/103 mg BID, carvedilol 3.125 mg BID, furosemide 80 mg daily, potassium 10 mEq  daily o APPROVED for Entresto assistance through 08/12/2019 . Ventricular tachycardia: amiodarone 300 mg daily; . COPD: Stiolto 2.5/2.5 mg daily 2 puffs daily reports he has not needed albuterol HFA or nebulizer recently, since reducing his tobacco consumption o APPROVED for Stiolto assistance through 07/28/20  Pharmacist Clinical Goal(s):  Marland Kitchen Over the next 90 days, patient will work with PharmD and provider towards tobacco cessation . Over the next 90 days, patient will work with multidisciplinary team on medication access.  Interventions: . Heard back from Dr. Rockey Situ. Given recent cath showing severe disease and long stretches of stent w/ moderate in-stent restenosis, he suggests continued DAPT w/ ASA and clopidogrel. Contacted patient, relayed this recommendation. He verbalizes understanding.  Patient Self Care Activities:  . Patient will commit to reducing tobacco consumption  Please see past updates related to this goal by clicking on the "Past Updates" button in the selected goal          Plan:  - Will outreach as previously scheduled  Catie Darnelle Maffucci, PharmD, [place], [place] Pharmacist [place] [phone]

## 2019-10-18 NOTE — Patient Instructions (Signed)
We will see you in follow-up in 4 months time  Continue Stiolto as you are doing  Continue your efforts to stop smoking, you are doing a great job

## 2019-10-18 NOTE — Patient Instructions (Signed)
Visit Information  Goals Addressed            This Visit's Progress     Patient Stated   . "I want to quit smoking" (pt-stated)       CARE PLAN ENTRY (see longtitudinal plan of care for additional care plan information)  Current Barriers:  . Polypharmacy, complex patient w/ multiple comorbidities including COPD, CHF, CAD, tobacco abuse  . Tobacco abuse of ~40 years; currently smoking 0-2 cigarettes daily (has had 2 days where he didn't smoke at all); "i'm knocking on the door of quitting, but my hand keeps slipping on the knob". Discussed that stressors have made it difficult for him o APPROVED for [name] assistance through 07/27/2020 . CAD s/p prior stenting; ASA 81 mg daily- notes he isn't taking regularly right now, clopidogrel 75 mg daily; atorvastatin 80 mg daily, ezetimibe 10 mg daily;  . HFrEF s/p AICD placement (most recent EF 25-30%); Entresto 97/103 mg BID, carvedilol 3.125 mg BID, furosemide 80 mg daily, potassium 10 mEq daily o APPROVED for Entresto assistance through 08/12/2019 . Ventricular tachycardia: amiodarone 300 mg daily; . COPD: Stiolto 2.5/2.5 mg daily 2 puffs daily reports he has not needed albuterol HFA or nebulizer recently, since reducing his tobacco consumption o APPROVED for Stiolto assistance through 07/28/20  Pharmacist Clinical Goal(s):  Marland Kitchen Over the next 90 days, patient will work with PharmD and provider towards tobacco cessation . Over the next 90 days, patient will work with multidisciplinary team on medication access.  Interventions: . Heard back from Dr. Rockey Situ. Given recent cath showing severe disease and long stretches of stent w/ moderate in-stent restenosis, he suggests continued DAPT w/ ASA and clopidogrel. Contacted patient, relayed this recommendation. He verbalizes understanding.  Patient Self Care Activities:  . Patient will commit to reducing tobacco consumption  Please see past updates related to this goal by clicking on the "Past  Updates" button in the selected goal         Patient verbalizes understanding of instructions provided today.   Plan:  - Will outreach as previously scheduled  Catie Darnelle Maffucci, PharmD, [place], [place] Pharmacist [place] [phone]

## 2019-10-29 ENCOUNTER — Other Ambulatory Visit: Payer: Self-pay | Admitting: Cardiovascular Disease

## 2019-11-11 ENCOUNTER — Telehealth: Payer: Medicare Other

## 2019-11-11 ENCOUNTER — Ambulatory Visit: Payer: Self-pay | Admitting: Pharmacist

## 2019-11-11 NOTE — Chronic Care Management (AMB) (Signed)
  Chronic Care Management   Note  11/11/2019 Name: Ian Mills MRN: [id number] DOB: Dec 26, 1955  [name] is a 64 y.o. year old male who is a primary care patient of McLean-Scocuzza, Nino Glow, MD. The CCM team was consulted for assistance with chronic disease management and care coordination needs.    Attempted to contact patient for medication management review. Left HIPAA compliant message for patient to return my call at their convenience.   Plan: - Will collaborate with Care Guide to outreach to schedule follow up with me  Catie Darnelle Maffucci, PharmD, [place], [place] Pharmacist [place] [phone]

## 2019-11-15 ENCOUNTER — Telehealth: Payer: Self-pay | Admitting: Internal Medicine

## 2019-11-15 NOTE — Chronic Care Management (AMB) (Signed)
°  Care Management   Note  11/15/2019 Name: Ian Mills MRN: [id number] DOB: 10/28/1955  Ian Mills is a 64 y.o. year old male who is a primary care patient of McLean-Scocuzza, Nino Glow, MD and is actively engaged with the care management team. I reached out to [name] by phone today to assist with re-scheduling a follow up visit with the Pharmacist  Follow up plan: Unsuccessful telephone outreach attempt made. A HIPPA compliant phone message was left for the patient providing contact information and requesting a return call.  The care management team will reach out to the patient again over the next 7 days.  If patient returns call to provider office, please advise to call [facility]  at [place], [place], [place], [place] [address] Direct Dial: [phone] [email or web address]: [email or web address]

## 2019-11-17 ENCOUNTER — Encounter: Payer: Self-pay | Admitting: Pharmacist

## 2019-11-17 ENCOUNTER — Ambulatory Visit (INDEPENDENT_AMBULATORY_CARE_PROVIDER_SITE_OTHER): Payer: Medicare Other | Admitting: Pharmacist

## 2019-11-17 DIAGNOSIS — I25119 Atherosclerotic heart disease of native coronary artery with unspecified angina pectoris: Secondary | ICD-10-CM

## 2019-11-17 DIAGNOSIS — Z720 Tobacco use: Secondary | ICD-10-CM

## 2019-11-17 NOTE — Chronic Care Management (AMB) (Signed)
Chronic Care Management   Follow Up Note   11/17/2019 Name: Ian Mills MRN: [id number] DOB: [birth year]  Referred by: McLean-Scocuzza, Nino Glow, MD Reason for referral : Chronic Care Management (Medication Management)   Ian Mills is a 64 y.o. year old male who is a primary care patient of McLean-Scocuzza, Nino Glow, MD. The CCM team was consulted for assistance with chronic disease management and care coordination needs.    Received call back from patient regarding medication management needs.   Review of patient status, including review of consultants reports, relevant laboratory and other test results, and collaboration with appropriate care team members and the patient's provider was performed as part of comprehensive patient evaluation and provision of chronic care management services.    SDOH (Social Determinants of Health) assessments performed: Yes See Care Plan activities for detailed interventions related to [facility])     Outpatient Encounter Medications as of 11/17/2019  Medication Sig Note  . amiodarone (PACERONE) 200 MG tablet Take 1.5 tablets (300 mg total) by mouth daily.   Marland Kitchen aspirin 81 MG EC tablet Take 1 tablet (81 mg total) by mouth daily.   Marland Kitchen atorvastatin (LIPITOR) 80 MG tablet Take 1 tablet (80 mg total) by mouth daily.   . carvedilol (COREG) 3.125 MG tablet Take 1 tablet (3.125 mg total) by mouth 2 (two) times daily.   . clopidogrel (PLAVIX) 75 MG tablet Take 1 tablet (75 mg total) by mouth daily.   Marland Kitchen ezetimibe (ZETIA) 10 MG tablet Take 1 tablet (10 mg total) by mouth daily.   . finasteride (PROSCAR) 5 MG tablet Take 5 mg by mouth daily.   . furosemide (LASIX) 40 MG tablet Take 1 tablet (40 mg total) by mouth daily. 09/30/2019: 40 mg twice daily   . potassium chloride (KLOR-CON 10) 10 MEQ tablet Take 1 tablet (10 mEq total) by mouth daily.   . sacubitril-valsartan (ENTRESTO) 97-103 MG Take 1 tablet by mouth 2 (two) times daily.   . Tiotropium Bromide-Olodaterol  (STIOLTO RESPIMAT) 2.5-2.5 MCG/ACT AERS Inhale 2 puffs into the lungs daily.   . [DISCONTINUED] atorvastatin (LIPITOR) 80 MG tablet Take 1 tablet (80 mg total) by mouth daily.   Marland Kitchen albuterol (VENTOLIN HFA) 108 (90 Base) MCG/ACT inhaler Inhale 1 puff into the lungs every 6 (six) hours as needed for wheezing or shortness of breath.   . ALPRAZolam (XANAX) 0.5 MG tablet Take 1 tablet (0.5 mg total) by mouth 3 (three) times daily as needed. for anxiety   . ipratropium-albuterol (DUONEB) 0.5-2.5 (3) MG/3ML SOLN Take 3 mLs by nebulization every 6 (six) hours as needed. (Patient not taking: Reported on 08/19/2019)   . nitroGLYCERIN (NITROSTAT) 0.4 MG SL tablet Place 0.4 mg under the tongue every 5 (five) minutes as needed.    . tadalafil (CIALIS) 10 MG tablet Take 1 tablet (10 mg total) by mouth daily as needed for erectile dysfunction (take 30 min prior to when needed.).   Marland Kitchen varenicline ([name] STARTING MONTH PAK) 0.5 MG X 11 & 1 MG X 42 tablet Take 0.5 mg by mouth 2 (two) times daily.     No facility-administered encounter medications on file as of 11/17/2019.     Objective:   Goals Addressed            This Visit's Progress     Patient Stated   . "I want to quit smoking" (pt-stated)       CARE PLAN ENTRY (see longtitudinal plan of care for additional care plan  information)  Current Barriers:  . Polypharmacy, complex patient w/ multiple comorbidities including COPD, CHF, CAD, tobacco abuse  . Tobacco abuse of ~40 years; reports today that it has been 8 days since his last cigarette o APPROVED for [name] assistance through 07/27/2020 . CAD s/p prior stenting; ASA 81 mg daily, clopidogrel 75 mg daily; atorvastatin 80 mg daily, ezetimibe 10 mg daily;  . HFrEF s/p AICD placement (most recent EF 25-30%); Entresto 97/103 mg BID, carvedilol 3.125 mg BID, furosemide 80 mg daily, potassium 10 mEq daily o APPROVED for Entresto assistance through 08/12/2019 . Ventricular tachycardia: amiodarone 300 mg  daily; . COPD: Stiolto 2.5/2.5 mg daily 2 puffs daily; no need for rescue albuterol recently  o APPROVED for Stiolto assistance through 07/28/20  Pharmacist Clinical Goal(s):  Marland Kitchen Over the next 90 days, patient will work with PharmD and provider towards tobacco cessation . Over the next 90 days, patient will work with multidisciplinary team on medication access.  Interventions: . Comprehensive medication review performed, medication list updated in electronic medical record . Inter-disciplinary care team collaboration (see longitudinal plan of care) . Provided significant congratulations to patient regarding abstinence from tobacco. Inquired about [name] - he has since run out of this medication. Placed call to [facility] to reorder, requested an expedited shipment. Order ID [id number]. Approximate next refill date 01/24/20. Would recommend continued [name] use for at least 3 months to reduce risk of relapse.   Patient Self Care Activities:  . Patient will commit to reducing tobacco consumption  Please see past updates related to this goal by clicking on the "Past Updates" button in the selected goal          Plan:  - Will outreach patient in ~2-3 weeks to follow up on tobacco cessation goal  Catie Darnelle Maffucci, PharmD, [place], [place] Pharmacist [place] [phone]

## 2019-11-17 NOTE — Chronic Care Management (AMB) (Signed)
  Care Management   Note  11/17/2019 Name: Ian Mills MRN: [id number] DOB: 10/19/1955  Ian Mills is a 64 y.o. year old male who is a primary care patient of McLean-Scocuzza, Nino Glow, MD and is actively engaged with the care management team. I reached out to [name] by phone today to assist with re-scheduling a follow up visit with the Pharmacist  Follow up plan: Unsuccessful telephone outreach attempt made. A HIPPA compliant phone message was left for the patient providing contact information and requesting a return call.  The care management team will reach out to the patient again over the next 7 days.  If patient returns call to provider office, please advise to call [facility]  at [place], [place], [place], [place] [address] Direct Dial: [phone] [email or web address]: [email or web address]

## 2019-11-17 NOTE — Patient Instructions (Signed)
Visit Information  Goals Addressed            This Visit's Progress     Patient Stated   . "I want to quit smoking" (pt-stated)       CARE PLAN ENTRY (see longtitudinal plan of care for additional care plan information)  Current Barriers:  . Polypharmacy, complex patient w/ multiple comorbidities including COPD, CHF, CAD, tobacco abuse  . Tobacco abuse of ~40 years; reports today that it has been 8 days since his last cigarette o APPROVED for [name] assistance through 07/27/2020 . CAD s/p prior stenting; ASA 81 mg daily, clopidogrel 75 mg daily; atorvastatin 80 mg daily, ezetimibe 10 mg daily;  . HFrEF s/p AICD placement (most recent EF 25-30%); Entresto 97/103 mg BID, carvedilol 3.125 mg BID, furosemide 80 mg daily, potassium 10 mEq daily o APPROVED for Entresto assistance through 08/12/2019 . Ventricular tachycardia: amiodarone 300 mg daily; . COPD: Stiolto 2.5/2.5 mg daily 2 puffs daily; no need for rescue albuterol recently  o APPROVED for Stiolto assistance through 07/28/20  Pharmacist Clinical Goal(s):  Marland Kitchen Over the next 90 days, patient will work with PharmD and provider towards tobacco cessation . Over the next 90 days, patient will work with multidisciplinary team on medication access.  Interventions: . Comprehensive medication review performed, medication list updated in electronic medical record . Inter-disciplinary care team collaboration (see longitudinal plan of care) . Provided significant congratulations to patient regarding abstinence from tobacco. Inquired about [name] - he has since run out of this medication. Placed call to [facility] to reorder, requested an expedited shipment. Order ID [id number]. Approximate next refill date 01/24/20. Would recommend continued [name] use for at least 3 months to reduce risk of relapse.   Patient Self Care Activities:  . Patient will commit to reducing tobacco consumption  Please see past updates related to this goal by clicking on  the "Past Updates" button in the selected goal         Patient verbalizes understanding of instructions provided today.   Plan:  - Will outreach patient in ~2-3 weeks to follow up on tobacco cessation goal  Catie Darnelle Maffucci, PharmD, [place], [place] Pharmacist [place] [phone]

## 2019-11-17 NOTE — Chronic Care Management (AMB) (Signed)
  Care Management   Note  11/17/2019 Name: RAMERO STANKEVICH MRN: [id number] DOB: 1955/10/27  TOMIO LAMIA is a 64 y.o. year old male who is a primary care patient of McLean-Scocuzza, Nino Glow, MD and is actively engaged with the care management team. I reached out to [name] by phone today to assist with re-scheduling a follow up visit with the Pharmacist  Follow up plan: Telephone appointment with care management team member scheduled for:12/30/2019  Noreene Larsson, [place], [place], [place] [address] Direct Dial: [phone] [email or web address] Website: [email or web address]

## 2019-11-18 ENCOUNTER — Other Ambulatory Visit: Payer: Self-pay | Admitting: Cardiovascular Disease

## 2019-11-22 ENCOUNTER — Other Ambulatory Visit: Payer: Self-pay

## 2019-11-22 ENCOUNTER — Ambulatory Visit (INDEPENDENT_AMBULATORY_CARE_PROVIDER_SITE_OTHER): Payer: Medicare Other

## 2019-11-22 DIAGNOSIS — N179 Acute kidney failure, unspecified: Secondary | ICD-10-CM

## 2019-11-26 ENCOUNTER — Telehealth: Payer: Self-pay | Admitting: Internal Medicine

## 2019-11-26 NOTE — Telephone Encounter (Signed)
Received a delivery for patient's [name]. Called and informed the patient and he will come and pick these up Monday. Left these upfront.

## 2019-12-02 ENCOUNTER — Telehealth: Payer: Self-pay | Admitting: *Deleted

## 2019-12-02 NOTE — Telephone Encounter (Signed)
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 We will need to watch for any GI issues

## 2019-12-02 NOTE — Telephone Encounter (Signed)
Left voicemail message for patient to call back for results.  

## 2019-12-06 ENCOUNTER — Ambulatory Visit: Payer: Self-pay | Admitting: Pharmacist

## 2019-12-06 DIAGNOSIS — J449 Chronic obstructive pulmonary disease, unspecified: Secondary | ICD-10-CM

## 2019-12-06 DIAGNOSIS — I25119 Atherosclerotic heart disease of native coronary artery with unspecified angina pectoris: Secondary | ICD-10-CM

## 2019-12-06 NOTE — Chronic Care Management (AMB) (Signed)
Chronic Care Management   Follow Up Note   12/06/2019 Name: YAMIR PAEZ MRN: [id number] DOB: [birth year]  Referred by: McLean-Scocuzza, Nino Glow, MD Reason for referral : Chronic Care Management (Medication Management)   PERCELL SCHLAUD is a 64 y.o. year old male who is a primary care patient of McLean-Scocuzza, Nino Glow, MD. The CCM team was consulted for assistance with chronic disease management and care coordination needs.    Contacted patient for medication management follow up.  Review of patient status, including review of consultants reports, relevant laboratory and other test results, and collaboration with appropriate care team members and the patient's provider was performed as part of comprehensive patient evaluation and provision of chronic care management services.    SDOH (Social Determinants of Health) assessments performed: Yes See Care Plan activities for detailed interventions related to [facility])     Outpatient Encounter Medications as of 12/06/2019  Medication Sig  . varenicline ([name] STARTING MONTH PAK) 0.5 MG X 11 & 1 MG X 42 tablet Take 0.5 mg by mouth 2 (two) times daily.   Marland Kitchen albuterol (VENTOLIN HFA) 108 (90 Base) MCG/ACT inhaler Inhale 1 puff into the lungs every 6 (six) hours as needed for wheezing or shortness of breath.  . ALPRAZolam (XANAX) 0.5 MG tablet Take 1 tablet (0.5 mg total) by mouth 3 (three) times daily as needed. for anxiety  . amiodarone (PACERONE) 200 MG tablet Take 1.5 tablets (300 mg total) by mouth daily.  Marland Kitchen aspirin 81 MG EC tablet Take 1 tablet (81 mg total) by mouth daily.  Marland Kitchen atorvastatin (LIPITOR) 80 MG tablet Take 1 tablet (80 mg total) by mouth daily.  . carvedilol (COREG) 3.125 MG tablet Take 1 tablet (3.125 mg total) by mouth 2 (two) times daily.  . clopidogrel (PLAVIX) 75 MG tablet Take 1 tablet (75 mg total) by mouth daily.  Marland Kitchen ezetimibe (ZETIA) 10 MG tablet Take 1 tablet (10 mg total) by mouth daily.  . finasteride (PROSCAR) 5 MG  tablet Take 5 mg by mouth daily.  . furosemide (LASIX) 40 MG tablet Take 1 tablet by mouth twice daily  . ipratropium-albuterol (DUONEB) 0.5-2.5 (3) MG/3ML SOLN Take 3 mLs by nebulization every 6 (six) hours as needed. (Patient not taking: Reported on 08/19/2019)  . nitroGLYCERIN (NITROSTAT) 0.4 MG SL tablet Place 0.4 mg under the tongue every 5 (five) minutes as needed.   . potassium chloride (KLOR-CON 10) 10 MEQ tablet Take 1 tablet (10 mEq total) by mouth daily.  . sacubitril-valsartan (ENTRESTO) 97-103 MG Take 1 tablet by mouth 2 (two) times daily.  . tadalafil (CIALIS) 10 MG tablet Take 1 tablet (10 mg total) by mouth daily as needed for erectile dysfunction (take 30 min prior to when needed.).  Marland Kitchen Tiotropium Bromide-Olodaterol (STIOLTO RESPIMAT) 2.5-2.5 MCG/ACT AERS Inhale 2 puffs into the lungs daily.   No facility-administered encounter medications on file as of 12/06/2019.     Objective:   Goals Addressed            This Visit's Progress     Patient Stated   . "I want to quit smoking" (pt-stated)       CARE PLAN ENTRY (see longtitudinal plan of care for additional care plan information)  Current Barriers:  . Polypharmacy, complex patient w/ multiple comorbidities including COPD, CHF, CAD, tobacco abuse  o Patient has some questions and concerns about recent renal artery ultrasound and implications of mesenteric artery stenosis. He would like to discuss w/ PCP.   Marland Kitchen  Tobacco abuse of ~40 years;  o Patient CONFIRMS he has remained tobacco free since ~11/09/19 o APPROVED for [name] assistance through 07/27/2020 . CAD s/p prior stenting; ASA 81 mg daily, clopidogrel 75 mg daily; atorvastatin 80 mg daily, ezetimibe 10 mg daily;  . HFrEF s/p AICD placement (most recent EF 25-30%); Entresto 97/103 mg BID, carvedilol 3.125 mg BID, furosemide 80 mg daily, potassium 10 mEq daily o APPROVED for Entresto assistance through 08/12/2019 . Ventricular tachycardia: amiodarone 300 mg daily; .  COPD: Stiolto 2.5/2.5 mg daily 2 puffs daily; no need for rescue albuterol recently  o APPROVED for Stiolto assistance through 07/28/20  Pharmacist Clinical Goal(s):  Marland Kitchen Over the next 90 days, patient will work with multidisciplinary team on medication access.  Interventions: . Comprehensive medication review performed, medication list updated in electronic medical record . Inter-disciplinary care team collaboration (see longitudinal plan of care) . Praised patient for continued tobacco abstinence. Recommend continuing [name] for at least 3 months past quit date, can consider longer use as well . Marland Kitchen Will collaborate w/ office staff to schedule PCP appointment for f/u and to discuss imaging results, as per patient request.   Patient Self Care Activities:  . Patient will commit to remaining tobacco-free  Please see past updates related to this goal by clicking on the "Past Updates" button in the selected goal          Plan:  - Will outreach in ~4 weeks as previously scheduled  Catie Darnelle Maffucci, PharmD, [place], [place] Pharmacist [facility] [phone]

## 2019-12-06 NOTE — Patient Instructions (Signed)
Visit Information  Goals Addressed            This Visit's Progress     Patient Stated   . "I want to quit smoking" (pt-stated)       CARE PLAN ENTRY (see longtitudinal plan of care for additional care plan information)  Current Barriers:  . Polypharmacy, complex patient w/ multiple comorbidities including COPD, CHF, CAD, tobacco abuse  o Patient has some questions and concerns about recent renal artery ultrasound and implications of mesenteric artery stenosis. He would like to discuss w/ PCP.   . Tobacco abuse of ~40 years;  o Patient CONFIRMS he has remained tobacco free since ~11/09/19 o APPROVED for [name] assistance through 07/27/2020 . CAD s/p prior stenting; ASA 81 mg daily, clopidogrel 75 mg daily; atorvastatin 80 mg daily, ezetimibe 10 mg daily;  . HFrEF s/p AICD placement (most recent EF 25-30%); Entresto 97/103 mg BID, carvedilol 3.125 mg BID, furosemide 80 mg daily, potassium 10 mEq daily o APPROVED for Entresto assistance through 08/12/2019 . Ventricular tachycardia: amiodarone 300 mg daily; . COPD: Stiolto 2.5/2.5 mg daily 2 puffs daily; no need for rescue albuterol recently  o APPROVED for Stiolto assistance through 07/28/20  Pharmacist Clinical Goal(s):  Marland Kitchen Over the next 90 days, patient will work with multidisciplinary team on medication access.  Interventions: . Comprehensive medication review performed, medication list updated in electronic medical record . Inter-disciplinary care team collaboration (see longitudinal plan of care) . Praised patient for continued tobacco abstinence. Recommend continuing [name] for at least 3 months past quit date, can consider longer use as well . Marland Kitchen Will collaborate w/ office staff to schedule PCP appointment for f/u and to discuss imaging results, as per patient request.   Patient Self Care Activities:  . Patient will commit to remaining tobacco-free  Please see past updates related to this goal by clicking on the "Past  Updates" button in the selected goal         Patient verbalizes understanding of instructions provided today.   Plan:  - Will outreach in ~4 weeks as previously scheduled  Catie Darnelle Maffucci, PharmD, [place], [place] Pharmacist [place] [phone]

## 2019-12-06 NOTE — Telephone Encounter (Signed)
Spoke with patient and reviewed results and recommendations by provider. He then requested that I please review this with his wife as well. Reviewed information with her in detail and requested that if he should develop any stomach problems to please let someone know. Advised that I would also send this information to his PCP for her to know as well. Both patient and spouse verbalized understanding with no further questions at this time.

## 2019-12-08 NOTE — Telephone Encounter (Signed)
Noted plaque build up in arteries supplying intestines if having abdominal pain with eating this could be side effect of this  Dr. Rockey Situ what about referral to Vascular for consult? Will you do this based on the test you all ordered?   [place]

## 2019-12-14 ENCOUNTER — Ambulatory Visit (INDEPENDENT_AMBULATORY_CARE_PROVIDER_SITE_OTHER): Payer: Medicare Other | Admitting: *Deleted

## 2019-12-14 DIAGNOSIS — I472 Ventricular tachycardia, unspecified: Secondary | ICD-10-CM

## 2019-12-14 DIAGNOSIS — I5022 Chronic systolic (congestive) heart failure: Secondary | ICD-10-CM

## 2019-12-14 LAB — CUP PACEART REMOTE DEVICE CHECK
Battery Remaining Longevity: 66 mo
Battery Remaining Percentage: 68 %
Brady Statistic RV Percent Paced: 0 %
Date Time Interrogation Session: 20210518023300
HighPow Impedance: 82 Ohm
Implantable Lead Implant Date: 20130503
Implantable Lead Location: 753860
Implantable Lead Model: 292
Implantable Lead Serial Number: 112568
Implantable Pulse Generator Implant Date: 20130503
Lead Channel Impedance Value: 599 Ohm
Lead Channel Pacing Threshold Amplitude: 0.8 V
Lead Channel Pacing Threshold Pulse Width: 0.5 ms
Lead Channel Setting Pacing Amplitude: 2.4 V
Lead Channel Setting Pacing Pulse Width: 0.5 ms
Lead Channel Setting Sensing Sensitivity: 0.6 mV
Pulse Gen Serial Number: 105232

## 2019-12-15 DIAGNOSIS — Z23 Encounter for immunization: Secondary | ICD-10-CM | POA: Diagnosis not present

## 2019-12-16 NOTE — Progress Notes (Signed)
Remote ICD transmission.   

## 2019-12-24 ENCOUNTER — Telehealth: Payer: Self-pay | Admitting: Internal Medicine

## 2019-12-24 ENCOUNTER — Ambulatory Visit: Payer: Self-pay | Admitting: Pharmacist

## 2019-12-24 ENCOUNTER — Other Ambulatory Visit: Payer: Self-pay | Admitting: Pharmacy Technician

## 2019-12-24 NOTE — Telephone Encounter (Signed)
Spoke with dr Olivia Mackie.  Left message to return call and schedule a follow up with her. She would like to refer Patient to vascular and GI.

## 2019-12-24 NOTE — Patient Outreach (Signed)
[facility]) Care Management  12/24/2019  Ian Mills [birth year] [id number]  Received the following message from [place] received a voicemail from patient requesting a call back from one of you.... He must have my number saved or something  Unsuccessful outreach call placed to patient. HIPAA compliant voicemail left.  Will await at return call as the message from [place] did not state why patient was calling.  Nicklos Gaxiola P. [place], [place]  [phone]

## 2019-12-24 NOTE — Telephone Encounter (Signed)
sch f/u in person appt in June/July 2021  Thx [place]

## 2019-12-24 NOTE — Telephone Encounter (Signed)
Pt would like a call back regarding what was discussed with Catie. He is requesting a follow up.

## 2019-12-24 NOTE — Telephone Encounter (Addendum)
Just called him. He wants to schedule a follow up appointment with Dr. Olivia Mackie to discuss the results of the renal ultrasound and next steps.

## 2019-12-24 NOTE — Telephone Encounter (Signed)
Pt called in and scheduled virtual visit for 12/28/19 @ 4pm

## 2019-12-24 NOTE — Chronic Care Management (AMB) (Signed)
  Chronic Care Management   Note  12/24/2019 Name: Ian Mills MRN: [id number] DOB: 11/06/1955  Ian Mills is a 65 y.o. year old male who is a primary care patient of McLean-Scocuzza, Nino Glow, MD. The CCM team was consulted for assistance with chronic disease management and care coordination needs.    Contacted patient. He has questions for PCP and plans to schedule a f/u appointment to discuss. Collaborated w/ office staff to notify [place], PharmD, [place], CPP Clinical Pharmacist [place] [phone]

## 2019-12-28 ENCOUNTER — Encounter: Payer: Self-pay | Admitting: Internal Medicine

## 2019-12-28 ENCOUNTER — Telehealth (INDEPENDENT_AMBULATORY_CARE_PROVIDER_SITE_OTHER): Payer: Medicare Other | Admitting: Internal Medicine

## 2019-12-28 VITALS — BP 130/90 | Ht 68.0 in | Wt 198.0 lb

## 2019-12-28 DIAGNOSIS — Z1211 Encounter for screening for malignant neoplasm of colon: Secondary | ICD-10-CM

## 2019-12-28 DIAGNOSIS — E785 Hyperlipidemia, unspecified: Secondary | ICD-10-CM

## 2019-12-28 DIAGNOSIS — K551 Chronic vascular disorders of intestine: Secondary | ICD-10-CM | POA: Diagnosis not present

## 2019-12-28 DIAGNOSIS — Z720 Tobacco use: Secondary | ICD-10-CM | POA: Diagnosis not present

## 2019-12-28 DIAGNOSIS — R7303 Prediabetes: Secondary | ICD-10-CM

## 2019-12-28 DIAGNOSIS — I10 Essential (primary) hypertension: Secondary | ICD-10-CM | POA: Diagnosis not present

## 2019-12-28 DIAGNOSIS — E876 Hypokalemia: Secondary | ICD-10-CM | POA: Diagnosis not present

## 2019-12-28 DIAGNOSIS — I25118 Atherosclerotic heart disease of native coronary artery with other forms of angina pectoris: Secondary | ICD-10-CM | POA: Diagnosis not present

## 2019-12-28 DIAGNOSIS — I255 Ischemic cardiomyopathy: Secondary | ICD-10-CM

## 2019-12-28 DIAGNOSIS — F419 Anxiety disorder, unspecified: Secondary | ICD-10-CM | POA: Diagnosis not present

## 2019-12-28 MED ORDER — EZETIMIBE 10 MG PO TABS: 10.0000 mg | ORAL_TABLET | Freq: Every day | ORAL | 3 refills | Status: DC

## 2019-12-28 MED ORDER — CARVEDILOL 3.125 MG PO TABS: 3.1250 mg | ORAL_TABLET | Freq: Two times a day (BID) | ORAL | 3 refills | Status: DC

## 2019-12-28 MED ORDER — CLOPIDOGREL BISULFATE 75 MG PO TABS: 75.0000 mg | ORAL_TABLET | Freq: Every day | ORAL | 3 refills | Status: DC

## 2019-12-28 MED ORDER — POTASSIUM CHLORIDE ER 10 MEQ PO TBCR: 10.0000 meq | EXTENDED_RELEASE_TABLET | Freq: Every day | ORAL | 3 refills | Status: DC

## 2019-12-28 MED ORDER — ALPRAZOLAM 0.5 MG PO TABS: 0.5000 mg | ORAL_TABLET | Freq: Three times a day (TID) | ORAL | 5 refills | Status: DC | PRN

## 2019-12-28 NOTE — Patient Instructions (Signed)
Chronic Mesenteric Ischemia  Chronic mesenteric ischemia is poor blood flow (circulation) in the vessels that supply blood to the stomach, intestines, and liver (mesenteric organs). When the blood supply is severely restricted, these organs cannot work properly. This condition is also called mesenteric angina, or intestinal angina. This condition is a long-term (chronic) condition. It happens when an artery or vein that provides blood to the mesenteric organs gradually becomes blocked or narrows over time, restricting the blood supply to these organs. What are the causes? This condition is commonly caused by fatty deposits that build up in an artery (plaque), which can narrow the artery and restrict blood flow. Other causes include:  Weakened areas in blood vessel walls (aneurysms).  Conditions that cause twisting or inflammation of blood vessels, such as fibromuscular dysplasia or arteritis.  A disorder in which blood clots form in the veins (venous thrombosis).  Scarring and thickening (fibrosis) of blood vessels caused by radiation therapy.  A tear in the aorta, the body's main artery (aortic dissection).  Blood vessel problems after illegal drug use, such as use of cocaine.  Tumors in the nervous system (neurofibromatosis).  Certain autoimmune diseases, such as lupus. What increases the risk? The following factors may make you more likely to develop this condition:  Being male.  Being over age 50, especially if you have a history of heart problems.  Smoking.  Having congestive heart failure.  Having an irregular heartbeat (arrhythmia).  Having a history of heart attack or stroke.  Having diabetes.  Having high cholesterol.  Having high blood pressure (hypertension).  Being overweight or obese.  Having kidney disease (renal disease) that requires dialysis. What are the signs or symptoms? Symptoms of this condition include:  Pain or cramps in the abdomen that  develop 15-60 minutes after a meal. This pain may last for 1-3 hours. Some people may develop a fear of eating because of this symptom.  Weight loss.  Diarrhea.  Bloody stool.  Nausea.  Vomiting.  Bloating.  Abdominal pain after stress or with exercise. How is this diagnosed? This condition is diagnosed based on:  Your medical history.  A physical exam.  Tests, such as: ? Ultrasound. ? CT scan. ? Blood tests. ? Urine tests. ? An imaging test that involves injecting a dye into your arteries to show blood flow through blood vessels (angiogram). This can help to show if there are any blockages in the vessels that lead to the intestines. ? Passing a small probe through the mouth and into the stomach to measure the output of carbon dioxide (gastric tonometry). This can help to indicate whether there is decreased blood flow to the stomach and intestines. How is this treated? This condition may be treated with:  Dietary changes such as eating smaller, low-fat, meals more frequently.  Lifestyle changes to treat underlying conditions that contribute to the disease, such as high cholesterol and high blood pressure.  Medicines to reduce blood clotting and increase blood flow.  Surgery to remove the blockage, repair arteries or veins, and restore blood flow. This may involve: ? Angioplasty. This is surgery to widen the affected artery, reduce the blockage, and sometimes insert a small, mesh tube (stent). ? Bypass surgery. This may be done to go around (bypass) the blockage and reconnect healthy arteries or veins. ? Placing a stent in the affected area. This may be done to help keep blocked arteries open. Follow these instructions at home: Eating and drinking   Eat a heart-healthy diet. This   includes fresh fruits and vegetables, whole grains, and lean proteins like chicken, fish, and beans.  Avoid foods that contain a lot of: ? Salt (sodium). ? Sugar. ? Saturated fat (such as  red meat). ? Trans fat (such as in fried foods).  Stay hydrated. Drink enough fluid to keep your urine pale yellow. Lifestyle  Stay active and get regular exercise as told by your health care provider. Aim for 150 minutes of moderate activity or 75 minutes of vigorous activity a week. Ask your health care provider what activities and forms of exercise are safe for you.  Maintain a healthy weight.  Work with your health care provider to manage your cholesterol.  Manage any other health problems you have, such as high blood pressure, diabetes, or heart rhythm problems.  Do not use any products that contain nicotine or tobacco, such as cigarettes, e-cigarettes, and chewing tobacco. If you need help quitting, ask your health care provider. General instructions  Take over-the-counter and prescription medicines only as told by your health care provider.  Keep all follow-up visits as told by your health care provider. This is important.  You may need to take actions to prevent or treat constipation, such as: ? Drink enough fluid to keep your urine pale yellow. ? Take over-the-counter or prescription medicines. ? Eat foods that are high in fiber, such as beans, whole grains, and fresh fruits and vegetables. ? Limit foods that are high in fat and processed sugars, such as fried or sweet foods. Contact a health care provider if:  Your symptoms do not improve or they return after treatment.  You have a fever.  You are constipated. Get help right away if you:  Have severe abdominal pain.  Have severe chest pain.  Have shortness of breath.  Feel weak or dizzy.  Have fast or irregular heartbeats (palpitations).  Have numbness or weakness in your face, arm, or leg.  Are confused.  Have trouble speaking or people have trouble understanding what you are saying.  Have trouble urinating.  Have blood in your stool.  Have severe nausea, vomiting, or persistent diarrhea. These  symptoms may represent a serious problem that is an emergency. Do not wait to see if the symptoms will go away. Get medical help right away. Call your local emergency services (911 in the [place] Do not drive yourself to the hospital. Summary  Mesenteric ischemia is poor circulation in the vessels that supply blood to the the stomach, intestines, and liver (mesenteric organs).  This condition happens when an artery or vein that provides blood to the mesenteric organs gradually becomes blocked or narrow, restricting the blood supply to the organs.  This condition is commonly caused by fatty deposits that build up in an artery (plaque), which can narrow the artery and restrict blood flow.  You are more likely to develop this condition if you are over age 50 and have a history of heart problems, high blood pressure, diabetes, or high cholesterol.  This condition is usually treated with medicines, dietary and lifestyle changes, and surgery to remove the blockage, repair arteries or veins, and restore blood flow. This information is not intended to replace advice given to you by your health care provider. Make sure you discuss any questions you have with your health care provider. Document Revised: 03/20/2018 Document Reviewed: 03/20/2018 Elsevier Patient Education  2020 Elsevier Inc.  

## 2019-12-28 NOTE — Progress Notes (Signed)
Telephone Note  I connected with Ian Mills  on 12/28/19 at  4:20 PM EDT by telephone and verified that I am speaking with the correct person using two identifiers.  Location patient: home Location provider:work or home office Persons participating in the virtual visit: patient, provider  I discussed the limitations of evaluation and management by telemedicine and the availability of in person appointments. The patient expressed understanding and agreed to proceed.   HPI: 1. Pt wanted to discuss vascular US 10/2019 with +70-99% SMA stenosis. He currently denies ab pain, diarrhea. He did quit smoking cigs 5 weeks ago (with help of chantix) reviewed alarm sx's of mesenteric ischemia and will mail info for pt to know alarm sx's and seek help if needed in the future  2. Agreeable to screening colonoscopy  3. HTN stable on meds CAD w/o current chest pain , schemic cardiomyopathy he needs refills of coreg 3.125 mg bid, plavix 75, zetia 10 qd, K, not able to take ntg 2/2 side effect h/a but when he has chest pain episodes in the ED given morphine   He is also in aspirin 81 mg qd, lipitor 80 mg qd, lasix 40 mg bid, entresto 97-103 4. Chronic anxiety on prn xanax 0.5 tid prn given refills today   ROS: See pertinent positives and negatives per HPI.  Past Medical History:  Diagnosis Date   Coronary artery disease    a. 3 stents to the LAD, 1 to the PDA, and 1 to the OM3; b. cath 06/2013: chronically occluded LAD, patent stent LAD, D1 20%, pLCx 30%, mLCx 20%, pRCA 70% (FFR 0.74) s/p PCI/DES, mRCA 70% s/p PCI/DES, RPDA 50%, EF 20% w/ AK of anterolateral & apical walls, moderately elevated LVEDP   HFrEF (heart failure with reduced ejection fraction) ([place])    a. 05/2014 Echo: EF 25-30%; b. 01/2018 Echo ([facility]): EF 30%.   HLD (hyperlipidemia)    HTN (hypertension)    ICD  single,BSX    a. DOI 11/2011; b. S/N# [id number]   Ischemic cardiomyopathy    a. 05/2014 Echo: EF 25-30%; b. 01/2018 Echo ([facility]):  EF 30%, sev glob HK w/ apical/ant AK. Mild LVH. Mod LAE, mild RAE. Triv MR/TR.   Pneumonia    08/2018   Syncope and collapse    Ventricular tachycardia ([place]) 11/2011   a. s/p AICD implant     Past Surgical History:  Procedure Laterality Date   appendectomy     CARDIAC CATHETERIZATION  Nov 30 2011   [facility]   CARDIAC DEFIBRILLATOR PLACEMENT  May 2013   CORONARY ANGIOPLASTY  2014   x 2 stents   CORONARY STENT PLACEMENT     x7   LEFT HEART CATH AND CORONARY ANGIOGRAPHY Left 08/30/2019   Procedure: LEFT HEART CATH AND CORONARY ANGIOGRAPHY;  Surgeon: Wellington Hampshire, MD;  Location: [facility];  Service: Cardiovascular;  Laterality: Left;   PROSTATE SURGERY     urethra grew around prostate    TUMOR REMOVAL  2001   chest thymic cyst; behind lungs [facility] benign     Family History  Problem Relation Age of Onset   Cancer Father        larynx   Cancer - Lung Mother    Hypertension Mother    Heart attack Mother    Dementia Maternal Grandmother    Heart attack Paternal Grandfather     SOCIAL HX: lives at home with wife    Current Outpatient Medications:    albuterol (VENTOLIN  HFA) 108 (90 Base) MCG/ACT inhaler, Inhale 1 puff into the lungs every 6 (six) hours as needed for wheezing or shortness of breath., Disp: , Rfl:    ALPRAZolam (XANAX) 0.5 MG tablet, Take 1 tablet (0.5 mg total) by mouth 3 (three) times daily as needed. for anxiety, Disp: 90 tablet, Rfl: 5   amiodarone (PACERONE) 200 MG tablet, Take 1.5 tablets (300 mg total) by mouth daily., Disp: 135 tablet, Rfl: 3   aspirin 81 MG EC tablet, Take 1 tablet (81 mg total) by mouth daily., Disp: 30 tablet, Rfl: 6   atorvastatin (LIPITOR) 80 MG tablet, Take 1 tablet (80 mg total) by mouth daily., Disp: 90 tablet, Rfl: 0   carvedilol (COREG) 3.125 MG tablet, Take 1 tablet (3.125 mg total) by mouth 2 (two) times daily., Disp: 180 tablet, Rfl: 3   clopidogrel (PLAVIX) 75 MG tablet, Take 1 tablet (75 mg  total) by mouth daily., Disp: 90 tablet, Rfl: 3   ezetimibe (ZETIA) 10 MG tablet, Take 1 tablet (10 mg total) by mouth daily., Disp: 90 tablet, Rfl: 3   finasteride (PROSCAR) 5 MG tablet, Take 5 mg by mouth daily., Disp: , Rfl:    furosemide (LASIX) 40 MG tablet, Take 1 tablet by mouth twice daily, Disp: 180 tablet, Rfl: 0   potassium chloride (KLOR-CON 10) 10 MEQ tablet, Take 1 tablet (10 mEq total) by mouth daily., Disp: 90 tablet, Rfl: 3   sacubitril-valsartan (ENTRESTO) 97-103 MG, Take 1 tablet by mouth 2 (two) times daily., Disp: 180 tablet, Rfl: 3   varenicline ([name] STARTING MONTH PAK) 0.5 MG X 11 & 1 MG X 42 tablet, Take 0.5 mg by mouth 2 (two) times daily. , Disp: , Rfl:    ipratropium-albuterol (DUONEB) 0.5-2.5 (3) MG/3ML SOLN, Take 3 mLs by nebulization every 6 (six) hours as needed. (Patient not taking: Reported on 08/19/2019), Disp: 360 mL, Rfl: 0   tadalafil (CIALIS) 10 MG tablet, Take 1 tablet (10 mg total) by mouth daily as needed for erectile dysfunction (take 30 min prior to when needed.)., Disp: 10 tablet, Rfl: 3   Tiotropium Bromide-Olodaterol (STIOLTO RESPIMAT) 2.5-2.5 MCG/ACT AERS, Inhale 2 puffs into the lungs daily. 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 w/o sxs of chronic mesenteric ischemia -consider vascular in the future if develops wt loss, diarrhea, ab pain after meals pt will let me know  Mailed info  Anxiety - Plan: ALPRAZolam (XANAX) 0.5 MG tablet tid prn Essential hypertension - Plan: carvedilol (COREG) 3.125 MG tablet bid  Cont lasix 40 mg bid  entresto 97-103     Ischemic cardiomyopathy s/p defibrillator /Coronary artery disease of native artery of native heart with stable angina pectoris ([place]) - Plan: carvedilol (COREG) 3.125 MG tablet, clopidogrel (PLAVIX) 75 MG tablet, cont statin lipitor 80 LDL at goal, cont aspirin 8 1mg  qd  F/u with cards  Hypokalemia - Plan: potassium chloride (KLOR-CON 10) 10 MEQ tablet  HM -consider [facility] lab (urine) with next labs prediabetic A1C 5.7 05/2019 Flu shot utd  pna 23 utd  prevnar due  covid vaccine 1/2 2nd 01/12/20  Tdap check [facility] per pt had in 03/2019  Consider shingrix  in the future   hcv 06/29/97 negative   Skin no issues  Colonoscopy due pt will consider in the future -will refer    PSA sees Dr. Vernice [facility] urology appt sch 05/2020 he is retiring 09/2019  -will refer 03/2020 for appt 05/2020 with [facility] Dr. Vicenta Dunning   Smoker CT chest 01/19/19 COPD still smoking rec cessation smoking <1 ppd some times <1/2 ppd or 5-6 cig qd since age 65 y.o smoker and quit x 5 year FH lung cancer in mother trying to get help with chantix  As of 12/28/19 quit smoking x 5 weeks congratulated   12/1/[place] cerumen impaction hearing loss with hearing aids improved after wax removal   Cards Dr. Rockey Situ and EP Dr. Jolyn Nap  -we discussed possible serious and likely etiologies, options for evaluation and workup, limitations of telemedicine visit vs in person visit, treatment, treatment risks and precautions. Pt prefers to treat via telemedicine empirically rather then risking or undertaking an in person visit at this moment. Patient agrees to seek prompt in person care if worsening, new symptoms arise, or if is not improving with treatment.   I discussed the assessment and treatment plan with the patient. The patient was provided an opportunity to ask questions and all were answered. The patient agreed with the plan and demonstrated an understanding of the instructions.   The  patient was advised to call back or seek an in-person evaluation if the symptoms worsen or if the condition fails to improve as anticipated.  Time spent 20 minutes Delorise Jackson, MD

## 2019-12-29 ENCOUNTER — Telehealth: Payer: Self-pay | Admitting: Internal Medicine

## 2019-12-29 NOTE — Telephone Encounter (Signed)
Lm to call office to make an appointment in 4 to 6 months for a follow up and to have fasting labs done around 02/20/20 at [facility] in [place].

## 2019-12-30 ENCOUNTER — Ambulatory Visit (INDEPENDENT_AMBULATORY_CARE_PROVIDER_SITE_OTHER): Payer: Medicare Other | Admitting: Pharmacist

## 2019-12-30 DIAGNOSIS — J449 Chronic obstructive pulmonary disease, unspecified: Secondary | ICD-10-CM | POA: Diagnosis not present

## 2019-12-30 DIAGNOSIS — I255 Ischemic cardiomyopathy: Secondary | ICD-10-CM | POA: Diagnosis not present

## 2019-12-30 DIAGNOSIS — I25119 Atherosclerotic heart disease of native coronary artery with unspecified angina pectoris: Secondary | ICD-10-CM | POA: Diagnosis not present

## 2019-12-30 NOTE — Chronic Care Management (AMB) (Signed)
Chronic Care Management   Follow Up Note   12/30/2019 Name: Ian Mills MRN: [id number] DOB: [birth year]  Referred by: McLean-Scocuzza, Nino Glow, MD Reason for referral : Chronic Care Management (Medication Management)   Ian Mills is a 64 y.o. year old male who is a primary care patient of McLean-Scocuzza, Nino Glow, MD. The CCM team was consulted for assistance with chronic disease management and care coordination needs.    Contacted patient for medication management review.   Review of patient status, including review of consultants reports, relevant laboratory and other test results, and collaboration with appropriate care team members and the patient's provider was performed as part of comprehensive patient evaluation and provision of chronic care management services.    SDOH (Social Determinants of Health) assessments performed: Yes See Care Plan activities for detailed interventions related to SDOH)  SDOH Interventions     Most Recent Value  SDOH Interventions  SDOH Interventions for the Following Domains  Tobacco  Tobacco Interventions  Cessation Materials Given and Reviewed       Outpatient Encounter Medications as of 12/30/2019  Medication Sig  . amiodarone (PACERONE) 200 MG tablet Take 1.5 tablets (300 mg total) by mouth daily.  . Tiotropium Bromide-Olodaterol (STIOLTO RESPIMAT) 2.5-2.5 MCG/ACT AERS Inhale 2 puffs into the lungs daily.  . varenicline ([name] STARTING MONTH PAK) 0.5 MG X 11 & 1 MG X 42 tablet Take 0.5 mg by mouth 2 (two) times daily.   Marland Kitchen albuterol (VENTOLIN HFA) 108 (90 Base) MCG/ACT inhaler Inhale 1 puff into the lungs every 6 (six) hours as needed for wheezing or shortness of breath.  . ALPRAZolam (XANAX) 0.5 MG tablet Take 1 tablet (0.5 mg total) by mouth 3 (three) times daily as needed. for anxiety  . aspirin 81 MG EC tablet Take 1 tablet (81 mg total) by mouth daily.  Marland Kitchen atorvastatin (LIPITOR) 80 MG tablet Take 1 tablet (80 mg total) by mouth daily.    . carvedilol (COREG) 3.125 MG tablet Take 1 tablet (3.125 mg total) by mouth 2 (two) times daily.  . clopidogrel (PLAVIX) 75 MG tablet Take 1 tablet (75 mg total) by mouth daily.  Marland Kitchen ezetimibe (ZETIA) 10 MG tablet Take 1 tablet (10 mg total) by mouth daily.  . finasteride (PROSCAR) 5 MG tablet Take 5 mg by mouth daily.  . furosemide (LASIX) 40 MG tablet Take 1 tablet by mouth twice daily  . ipratropium-albuterol (DUONEB) 0.5-2.5 (3) MG/3ML SOLN Take 3 mLs by nebulization every 6 (six) hours as needed. (Patient not taking: Reported on 08/19/2019)  . potassium chloride (KLOR-CON 10) 10 MEQ tablet Take 1 tablet (10 mEq total) by mouth daily.  . sacubitril-valsartan (ENTRESTO) 97-103 MG Take 1 tablet by mouth 2 (two) times daily.  . tadalafil (CIALIS) 10 MG tablet Take 1 tablet (10 mg total) by mouth daily as needed for erectile dysfunction (take 30 min prior to when needed.).   No facility-administered encounter medications on file as of 12/30/2019.     Objective:   Goals Addressed            This Visit's Progress     Patient Stated   . "I want to quit smoking" (pt-stated)       CARE PLAN ENTRY (see longtitudinal plan of care for additional care plan information)  Current Barriers:  . Polypharmacy, complex patient w/ multiple comorbidities including COPD, CHF, CAD, tobacco abuse  o Recently saw PCP, reviewed ultrasound results. Notes that he plans to call GI  today to schedule f/u, and also plans to call clinic back to schedule f/u with PCP o Reports he is up to date on all medications received through patient assistance . Tobacco abuse of ~40 years;  o Patient CONFIRMS he has remained tobacco free since ~11/09/19 o APPROVED for [name] assistance through 07/27/2020 . CAD s/p prior stenting; ASA 81 mg daily, clopidogrel 75 mg daily; atorvastatin 80 mg daily, ezetimibe 10 mg daily;  . HFrEF s/p AICD placement (most recent EF 25-30%); Entresto 97/103 mg BID, carvedilol 3.125 mg BID,  furosemide 80 mg daily, potassium 10 mEq daily o Home BP readings: 130s/60s; has a pulse ox that he uses to check O2 sat, but hasn't been attending to [facility] for Entresto assistance through 08/11/2020 . Ventricular tachycardia: amiodarone 300 mg daily; . COPD: Stiolto 2.5/2.5 mg daily 2 puffs daily; no need for rescue albuterol since stopping smoking. o APPROVED for Stiolto assistance through 07/28/20  Pharmacist Clinical Goal(s):  Marland Kitchen Over the next 90 days, patient will work with multidisciplinary team on medication access.  Interventions: . Comprehensive medication review performed, medication list updated in electronic medical record . Inter-disciplinary care team collaboration (see longitudinal plan of care) . Praised for commitment to tobacco cessation. Animal nutritionist . Reviewed notes, [name] refill date due 01/24/20. Will send a reminder to myself to call [facility] and place reorder request. Recommend continuing tx for at least 3 months, though longer may be appropriate, as patient continues to endorse it being difficult to avoid his first cigarette of the morning. Encouraged to continue to focus on other things to take his mind off smoking  Patient Self Care Activities:  . Patient will commit to remaining tobacco-free  Please see past updates related to this goal by clicking on the "Past Updates" button in the selected goal          Plan:  - Scheduled f/u call in ~ 10 weeks  Catie Darnelle Maffucci, PharmD, [place], [place] Pharmacist [place] [phone]

## 2019-12-30 NOTE — Patient Instructions (Signed)
Visit Information  Goals Addressed            This Visit's Progress     Patient Stated   . "I want to quit smoking" (pt-stated)       CARE PLAN ENTRY (see longtitudinal plan of care for additional care plan information)  Current Barriers:  . Polypharmacy, complex patient w/ multiple comorbidities including COPD, CHF, CAD, tobacco abuse  o Recently saw PCP, reviewed ultrasound results. Notes that he plans to call GI today to schedule f/u, and also plans to call clinic back to schedule f/u with PCP o Reports he is up to date on all medications received through patient assistance . Tobacco abuse of ~40 years;  o Patient CONFIRMS he has remained tobacco free since ~11/09/19 o APPROVED for [name] assistance through 07/27/2020 . CAD s/p prior stenting; ASA 81 mg daily, clopidogrel 75 mg daily; atorvastatin 80 mg daily, ezetimibe 10 mg daily;  . HFrEF s/p AICD placement (most recent EF 25-30%); Entresto 97/103 mg BID, carvedilol 3.125 mg BID, furosemide 80 mg daily, potassium 10 mEq daily o Home BP readings: 130s/60s; has a pulse ox that he uses to check O2 sat, but hasn't been attending to [facility] for Entresto assistance through 08/11/2020 . Ventricular tachycardia: amiodarone 300 mg daily; . COPD: Stiolto 2.5/2.5 mg daily 2 puffs daily; no need for rescue albuterol since stopping smoking. o APPROVED for Stiolto assistance through 07/28/20  Pharmacist Clinical Goal(s):  Marland Kitchen Over the next 90 days, patient will work with multidisciplinary team on medication access.  Interventions: . Comprehensive medication review performed, medication list updated in electronic medical record . Inter-disciplinary care team collaboration (see longitudinal plan of care) . Praised for commitment to tobacco cessation. Animal nutritionist . Reviewed notes, [name] refill date due 01/24/20. Will send a reminder to myself to call [facility] and place reorder request. Recommend continuing tx for at  least 3 months, though longer may be appropriate, as patient continues to endorse it being difficult to avoid his first cigarette of the morning. Encouraged to continue to focus on other things to take his mind off smoking  Patient Self Care Activities:  . Patient will commit to remaining tobacco-free  Please see past updates related to this goal by clicking on the "Past Updates" button in the selected goal         Patient verbalizes understanding of instructions provided today.   Plan:  - Scheduled f/u call in ~ 10 weeks  Catie Darnelle Maffucci, PharmD, [place], [place] Pharmacist [place] [phone]

## 2019-12-31 ENCOUNTER — Encounter: Payer: Self-pay | Admitting: Physician Assistant

## 2020-01-12 DIAGNOSIS — Z23 Encounter for immunization: Secondary | ICD-10-CM | POA: Diagnosis not present

## 2020-01-17 ENCOUNTER — Telehealth: Payer: Self-pay

## 2020-01-17 NOTE — Telephone Encounter (Signed)
Message left notifying patient that it is time to schedule the low dose lung cancer screening CT scan.  Instructed patient to return call to Shawn Perkins at [phone] to verify information prior to CT scan being scheduled.    

## 2020-01-19 ENCOUNTER — Ambulatory Visit: Payer: Self-pay | Admitting: Pharmacist

## 2020-01-19 DIAGNOSIS — J449 Chronic obstructive pulmonary disease, unspecified: Secondary | ICD-10-CM

## 2020-01-19 NOTE — Chronic Care Management (AMB) (Signed)
Chronic Care Management   Follow Up Note   01/19/2020 Name: NEZAR BUCKLES MRN: [id number] DOB: Jul 15, 1956  Referred by: McLean-Scocuzza, Nino Glow, MD Reason for referral : Chronic Care Management (Medication Management)   NYLE LIMB is a 64 y.o. year old male who is a primary care patient of McLean-Scocuzza, Nino Glow, MD. The CCM team was consulted for assistance with chronic disease management and care coordination needs.    Received call from patient today w/ medication access questions.  Review of patient status, including review of consultants reports, relevant laboratory and other test results, and collaboration with appropriate care team members and the patient's provider was performed as part of comprehensive patient evaluation and provision of chronic care management services.    SDOH (Social Determinants of Health) assessments performed: No See Care Plan activities for detailed interventions related to [facility])     Outpatient Encounter Medications as of 01/19/2020  Medication Sig  . albuterol (VENTOLIN HFA) 108 (90 Base) MCG/ACT inhaler Inhale 1 puff into the lungs every 6 (six) hours as needed for wheezing or shortness of breath.  . ALPRAZolam (XANAX) 0.5 MG tablet Take 1 tablet (0.5 mg total) by mouth 3 (three) times daily as needed. for anxiety  . amiodarone (PACERONE) 200 MG tablet Take 1.5 tablets (300 mg total) by mouth daily.  Marland Kitchen aspirin 81 MG EC tablet Take 1 tablet (81 mg total) by mouth daily.  Marland Kitchen atorvastatin (LIPITOR) 80 MG tablet Take 1 tablet (80 mg total) by mouth daily.  . carvedilol (COREG) 3.125 MG tablet Take 1 tablet (3.125 mg total) by mouth 2 (two) times daily.  . clopidogrel (PLAVIX) 75 MG tablet Take 1 tablet (75 mg total) by mouth daily.  Marland Kitchen ezetimibe (ZETIA) 10 MG tablet Take 1 tablet (10 mg total) by mouth daily.  . finasteride (PROSCAR) 5 MG tablet Take 5 mg by mouth daily.  . furosemide (LASIX) 40 MG tablet Take 1 tablet by mouth twice daily  .  ipratropium-albuterol (DUONEB) 0.5-2.5 (3) MG/3ML SOLN Take 3 mLs by nebulization every 6 (six) hours as needed. (Patient not taking: Reported on 08/19/2019)  . potassium chloride (KLOR-CON 10) 10 MEQ tablet Take 1 tablet (10 mEq total) by mouth daily.  . sacubitril-valsartan (ENTRESTO) 97-103 MG Take 1 tablet by mouth 2 (two) times daily.  . tadalafil (CIALIS) 10 MG tablet Take 1 tablet (10 mg total) by mouth daily as needed for erectile dysfunction (take 30 min prior to when needed.).  Marland Kitchen Tiotropium Bromide-Olodaterol (STIOLTO RESPIMAT) 2.5-2.5 MCG/ACT AERS Inhale 2 puffs into the lungs daily.  . varenicline ([name] STARTING MONTH PAK) 0.5 MG X 11 & 1 MG X 42 tablet Take 0.5 mg by mouth 2 (two) times daily.    No facility-administered encounter medications on file as of 01/19/2020.     Objective:   Goals Addressed              This Visit's Progress     Patient Stated   .  "I want to quit smoking" (pt-stated)        CARE PLAN ENTRY (see longtitudinal plan of care for additional care plan information)  Current Barriers:  . Polypharmacy, complex patient w/ multiple comorbidities including COPD, CHF, CAD, tobacco abuse  o Calls today needing help refilling Stiolto through [facility] cares . Tobacco abuse of ~40 years;  o Patient CONFIRMS he has remained tobacco free since ~11/09/19 o APPROVED for [name] assistance through 07/27/2020 . CAD s/p prior stenting; ASA 81 mg  daily, clopidogrel 75 mg daily; atorvastatin 80 mg daily, ezetimibe 10 mg daily;  . HFrEF s/p AICD placement (most recent EF 25-30%); Entresto 97/103 mg BID, carvedilol 3.125 mg BID, furosemide 80 mg daily, potassium 10 mEq daily o APPROVED for Entresto assistance through 08/11/2020 . Ventricular tachycardia: amiodarone 300 mg daily; . COPD: Stiolto 2.5/2.5 mg daily 2 puffs daily; no need for rescue albuterol since stopping smoking. o APPROVED for Stiolto assistance through 07/28/20  Pharmacist Clinical Goal(s):  Marland Kitchen Over the  next 90 days, patient will work with multidisciplinary team on medication access.  Interventions: . Provided phone number for [facility] dispensing pharmacy. Patient will call and request an expedited refill on Stiolto  Patient Self Care Activities:  . Patient will commit to remaining tobacco-free  Please see past updates related to this goal by clicking on the "Past Updates" button in the selected goal        Other   .  Follow up with Provider as scheduled          Plan:  - Will outreach as previously scheduled  Catie Darnelle Maffucci, PharmD, [place], [place] Pharmacist [place] [phone]

## 2020-01-19 NOTE — Patient Instructions (Signed)
Visit Information  Goals Addressed              This Visit's Progress     Patient Stated   .  "I want to quit smoking" (pt-stated)        CARE PLAN ENTRY (see longtitudinal plan of care for additional care plan information)  Current Barriers:  . Polypharmacy, complex patient w/ multiple comorbidities including COPD, CHF, CAD, tobacco abuse  o Calls today needing help refilling Stiolto through [facility] cares . Tobacco abuse of ~40 years;  o Patient CONFIRMS he has remained tobacco free since ~11/09/19 o APPROVED for [name] assistance through 07/27/2020 . CAD s/p prior stenting; ASA 81 mg daily, clopidogrel 75 mg daily; atorvastatin 80 mg daily, ezetimibe 10 mg daily;  . HFrEF s/p AICD placement (most recent EF 25-30%); Entresto 97/103 mg BID, carvedilol 3.125 mg BID, furosemide 80 mg daily, potassium 10 mEq daily o APPROVED for Entresto assistance through 08/11/2020 . Ventricular tachycardia: amiodarone 300 mg daily; . COPD: Stiolto 2.5/2.5 mg daily 2 puffs daily; no need for rescue albuterol since stopping smoking. o APPROVED for Stiolto assistance through 07/28/20  Pharmacist Clinical Goal(s):  Marland Kitchen Over the next 90 days, patient will work with multidisciplinary team on medication access.  Interventions: . Provided phone number for [facility] dispensing pharmacy. Patient will call and request an expedited refill on Stiolto  Patient Self Care Activities:  . Patient will commit to remaining tobacco-free  Please see past updates related to this goal by clicking on the "Past Updates" button in the selected goal        Other   .  Follow up with Provider as scheduled         The patient verbalized understanding of instructions provided today and declined a print copy of patient instruction materials.   Plan:  - Will outreach as previously scheduled  Catie Darnelle Maffucci, PharmD, [place], [place] Pharmacist [place] [phone]

## 2020-01-24 ENCOUNTER — Ambulatory Visit: Payer: Self-pay | Admitting: Pharmacist

## 2020-01-24 ENCOUNTER — Telehealth: Payer: Medicare Other

## 2020-01-24 NOTE — Chronic Care Management (AMB) (Signed)
°  Chronic Care Management   Note  01/24/2020 Name: KEBIN MAYE MRN: [id number] DOB: 05/14/1956  WARIS RODGER is a 64 y.o. year old male who is a primary care patient of McLean-Scocuzza, Nino Glow, MD. The CCM team was consulted for assistance with chronic disease management and care coordination needs.    Had a reminder set for myself to call [facility] today to request reorder on patient's [name] supply from [employer] assistance program. However, it was announced last week that [facility] was halting distribution of [name] d/t presence of [place] in some lots of the drug.   Contacted [employer] [facility] to see if they are still distributing PAP supply of [name]. They note that the medication is "on backorder", and they will notify primary care providers via fax when they medication is available.   Attempted to contact patient to discuss. Left HIPAA compliant message for him to return my call at his convenience.   Follow up plan: - Will f/u later this week if I have not heard back from patient  Catie Darnelle Maffucci, PharmD, [place], [place] Pharmacist [place] [phone]

## 2020-01-25 ENCOUNTER — Ambulatory Visit: Payer: Self-pay | Admitting: Pharmacist

## 2020-01-25 ENCOUNTER — Encounter: Payer: Self-pay | Admitting: Physician Assistant

## 2020-01-25 ENCOUNTER — Ambulatory Visit (INDEPENDENT_AMBULATORY_CARE_PROVIDER_SITE_OTHER): Payer: Medicare Other | Admitting: Physician Assistant

## 2020-01-25 VITALS — BP 128/72 | HR 80 | Ht 68.0 in | Wt 205.1 lb

## 2020-01-25 DIAGNOSIS — I255 Ischemic cardiomyopathy: Secondary | ICD-10-CM | POA: Diagnosis not present

## 2020-01-25 DIAGNOSIS — Z7901 Long term (current) use of anticoagulants: Secondary | ICD-10-CM

## 2020-01-25 DIAGNOSIS — R931 Abnormal findings on diagnostic imaging of heart and coronary circulation: Secondary | ICD-10-CM

## 2020-01-25 DIAGNOSIS — Z1212 Encounter for screening for malignant neoplasm of rectum: Secondary | ICD-10-CM | POA: Diagnosis not present

## 2020-01-25 DIAGNOSIS — I200 Unstable angina: Secondary | ICD-10-CM

## 2020-01-25 DIAGNOSIS — I25119 Atherosclerotic heart disease of native coronary artery with unspecified angina pectoris: Secondary | ICD-10-CM

## 2020-01-25 DIAGNOSIS — Z1211 Encounter for screening for malignant neoplasm of colon: Secondary | ICD-10-CM

## 2020-01-25 DIAGNOSIS — Z720 Tobacco use: Secondary | ICD-10-CM

## 2020-01-25 DIAGNOSIS — J449 Chronic obstructive pulmonary disease, unspecified: Secondary | ICD-10-CM | POA: Diagnosis not present

## 2020-01-25 NOTE — Patient Instructions (Signed)
Visit Information  Goals Addressed              This Visit's Progress     Patient Stated   .  "I want to quit smoking" (pt-stated)        CARE PLAN ENTRY (see longtitudinal plan of care for additional care plan information)  Current Barriers:  . Polypharmacy, complex patient w/ multiple comorbidities including COPD, CHF, CAD, tobacco abuse  o [name] backorder d/t presence of [place] in some lots of tablets. Patient notes he has ~2 weeks of [name] 1 mg BID.  o Patient reports he still does have cravings occasionally, and tries to ignore them.  . Tobacco abuse of ~40 years;  o Patient CONFIRMS he has remained tobacco free since ~11/09/19 o APPROVED for [name] assistance through 07/27/2020 . CAD s/p prior stenting; ASA 81 mg daily, clopidogrel 75 mg daily; atorvastatin 80 mg daily, ezetimibe 10 mg daily;  . HFrEF s/p AICD placement (most recent EF 25-30%); Entresto 97/103 mg BID, carvedilol 3.125 mg BID, furosemide 80 mg daily, potassium 10 mEq daily o APPROVED for Entresto assistance through 08/11/2020 . Ventricular tachycardia: amiodarone 300 mg daily; . COPD: Stiolto 2.5/2.5 mg daily 2 puffs daily; no need for rescue albuterol since stopping smoking. o APPROVED for Stiolto assistance through 07/28/20  Pharmacist Clinical Goal(s):  Marland Kitchen Over the next 90 days, patient will work with multidisciplinary team on medication access.  Interventions: . Contacted [employer] [facility]. They confirm that they have a backorder on [name] and cannot supply right now. Recall in effect.  . Reviewed with patient the concern with stopping [name] ~3 months. Discussed using the decreased dose of [name] - 0.5 mg BID. He can split his current supply to strength duration of tobacco cessation therapy. Will discuss alternative options with PCP. Could consider starting bupropion to help reduce tobacco cravings.   Patient Self Care Activities:  . Patient will commit to remaining tobacco-free  Please see past updates  related to this goal by clicking on the "Past Updates" button in the selected goal         The patient verbalized understanding of instructions provided today and declined a print copy of patient instruction materials.   Plan:  - Will outreach as previously scheduled  Catie Darnelle Maffucci, PharmD, [place], [place] Pharmacist [place] [phone]

## 2020-01-25 NOTE — Chronic Care Management (AMB) (Signed)
Chronic Care Management   Follow Up Note   01/25/2020 Name: Ian Mills MRN: [id number] DOB: 1956/05/05  Referred by: McLean-Scocuzza, Nino Glow, MD Reason for referral : Chronic Care Management (Medication Management)   Ian Mills is a 64 y.o. year old male who is a primary care patient of McLean-Scocuzza, Nino Glow, MD. The CCM team was consulted for assistance with chronic disease management and care coordination needs.    Contacted patient for medication management review.   Review of patient status, including review of consultants reports, relevant laboratory and other test results, and collaboration with appropriate care team members and the patient's provider was performed as part of comprehensive patient evaluation and provision of chronic care management services.    SDOH (Social Determinants of Health) assessments performed: Yes See Care Plan activities for detailed interventions related to [facility])     Outpatient Encounter Medications as of 01/25/2020  Medication Sig  . albuterol (VENTOLIN HFA) 108 (90 Base) MCG/ACT inhaler Inhale 1 puff into the lungs every 6 (six) hours as needed for wheezing or shortness of breath.  . ALPRAZolam (XANAX) 0.5 MG tablet Take 1 tablet (0.5 mg total) by mouth 3 (three) times daily as needed. for anxiety  . amiodarone (PACERONE) 200 MG tablet Take 1.5 tablets (300 mg total) by mouth daily.  Marland Kitchen aspirin 81 MG EC tablet Take 1 tablet (81 mg total) by mouth daily.  Marland Kitchen atorvastatin (LIPITOR) 80 MG tablet Take 1 tablet (80 mg total) by mouth daily.  . carvedilol (COREG) 3.125 MG tablet Take 1 tablet (3.125 mg total) by mouth 2 (two) times daily.  . clopidogrel (PLAVIX) 75 MG tablet Take 1 tablet (75 mg total) by mouth daily.  Marland Kitchen ezetimibe (ZETIA) 10 MG tablet Take 1 tablet (10 mg total) by mouth daily.  . finasteride (PROSCAR) 5 MG tablet Take 5 mg by mouth daily.  . furosemide (LASIX) 40 MG tablet Take 1 tablet by mouth twice daily  .  ipratropium-albuterol (DUONEB) 0.5-2.5 (3) MG/3ML SOLN Take 3 mLs by nebulization every 6 (six) hours as needed.  . potassium chloride (KLOR-CON 10) 10 MEQ tablet Take 1 tablet (10 mEq total) by mouth daily.  . sacubitril-valsartan (ENTRESTO) 97-103 MG Take 1 tablet by mouth 2 (two) times daily.  . tadalafil (CIALIS) 10 MG tablet Take 1 tablet (10 mg total) by mouth daily as needed for erectile dysfunction (take 30 min prior to when needed.).  Marland Kitchen Tiotropium Bromide-Olodaterol (STIOLTO RESPIMAT) 2.5-2.5 MCG/ACT AERS Inhale 2 puffs into the lungs daily.  . varenicline ([name] STARTING MONTH PAK) 0.5 MG X 11 & 1 MG X 42 tablet Take 0.5 mg by mouth 2 (two) times daily.    No facility-administered encounter medications on file as of 01/25/2020.     Objective:   Goals Addressed              This Visit's Progress     Patient Stated   .  "I want to quit smoking" (pt-stated)        CARE PLAN ENTRY (see longtitudinal plan of care for additional care plan information)  Current Barriers:  . Polypharmacy, complex patient w/ multiple comorbidities including COPD, CHF, CAD, tobacco abuse  o [name] backorder d/t presence of [name] in some lots of tablets. Patient notes he has ~2 weeks of [name] 1 mg BID.  o Patient reports he still does have cravings occasionally, and tries to ignore them.  . Tobacco abuse of ~40 years;  o Patient CONFIRMS he  has remained tobacco free since ~11/09/19 o APPROVED for [name] assistance through 07/27/2020 . CAD s/p prior stenting; ASA 81 mg daily, clopidogrel 75 mg daily; atorvastatin 80 mg daily, ezetimibe 10 mg daily;  . HFrEF s/p AICD placement (most recent EF 25-30%); Entresto 97/103 mg BID, carvedilol 3.125 mg BID, furosemide 80 mg daily, potassium 10 mEq daily o APPROVED for Entresto assistance through 08/11/2020 . Ventricular tachycardia: amiodarone 300 mg daily; . COPD: Stiolto 2.5/2.5 mg daily 2 puffs daily; no need for rescue albuterol since stopping  smoking. o APPROVED for Stiolto assistance through 07/28/20  Pharmacist Clinical Goal(s):  Marland Kitchen Over the next 90 days, patient will work with multidisciplinary team on medication access.  Interventions: . Contacted [employer] [facility]. They confirm that they have a backorder on [name] and cannot supply right now. Recall in effect.  . Reviewed with patient the concern with stopping [name] ~3 months. Discussed using the decreased dose of [name] - 0.5 mg BID. He can split his current supply to strength duration of tobacco cessation therapy. Will discuss alternative options with PCP. Could consider starting bupropion to help reduce tobacco cravings.   Patient Self Care Activities:  . Patient will commit to remaining tobacco-free  Please see past updates related to this goal by clicking on the "Past Updates" button in the selected goal          Plan:  - Will outreach as previously scheduled  Catie Darnelle Maffucci, PharmD, [place], [place] Pharmacist [place] [facility] [phone]

## 2020-01-25 NOTE — Patient Instructions (Signed)
If you are age 66 or older, your body mass index should be between 23-30. Your Body mass index is 31.19 kg/m. If this is out of the aforementioned range listed, please consider follow up with your Primary Care Provider.  If you are age 52 or younger, your body mass index should be between 19-25. Your Body mass index is 31.19 kg/m. If this is out of the aformentioned range listed, please consider follow up with your Primary Care Provider.   Will call once September date become available to schedule colonoscopy.

## 2020-01-25 NOTE — Progress Notes (Signed)
Chief Complaint: Preprocedural visit for a screening colonoscopy in a patient on chronic anticoagulation  HPI:    Ian Mills is a 64 year old male with past medical history of COPD, CAD as listed below status post stent placement, last in 2014 as well as heart failure with reduced ejection fraction status post ICD placement on Plavix (06/08/2014 echo LVEF 25-30%, repeat 01/2018 at [place] 30%), who was referred to me by McLean-Scocuzza, Olivia Mackie * for a preprocedural visit for a screening colonoscopy.    Today, the patient presents to clinic and tells me he knows he is due for a screening colonoscopy.  He thinks he may have had one about 10 years ago at [facility], but "no one can find a record of this", on further questioning patient does not remember prepping for this procedure, just staying n.p.o. and thinks it was done for vomiting, sounds like it may have been an EGD?  Denies any GI issues.  He has had to hold his Plavix in the past.    Denies fever, chills, weight loss, change in bowel habits, abdominal pain, heartburn, reflux or symptoms that awaken him from sleep.  Past Medical History:  Diagnosis Date  . Coronary artery disease    a. 3 stents to the LAD, 1 to the PDA, and 1 to the OM3; b. cath 06/2013: chronically occluded LAD, patent stent LAD, D1 20%, pLCx 30%, mLCx 20%, pRCA 70% (FFR 0.74) s/p PCI/DES, mRCA 70% s/p PCI/DES, RPDA 50%, EF 20% w/ AK of anterolateral & apical walls, moderately elevated LVEDP  . HFrEF (heart failure with reduced ejection fraction) ([place])    a. 05/2014 Echo: EF 25-30%; b. 01/2018 Echo ([facility]): EF 30%.  Marland Kitchen HLD (hyperlipidemia)   . HTN (hypertension)   . ICD  single,BSX    a. DOI 11/2011; b. S/N# [id number]  . Ischemic cardiomyopathy    a. 05/2014 Echo: EF 25-30%; b. 01/2018 Echo ([facility]): EF 30%, sev glob HK w/ apical/ant AK. Mild LVH. Mod LAE, mild RAE. Triv MR/TR.  Marland Kitchen Pneumonia    08/2018  . Syncope and collapse   . Ventricular tachycardia ([place]) 11/2011   a. s/p AICD implant       Past Surgical History:  Procedure Laterality Date  . appendectomy    . CARDIAC CATHETERIZATION  Nov 30 2011   [facility]  . CARDIAC DEFIBRILLATOR PLACEMENT  May 2013  . CORONARY ANGIOPLASTY  2014   x 2 stents  . CORONARY STENT PLACEMENT     x7  . LEFT HEART CATH AND CORONARY ANGIOGRAPHY Left 08/30/2019   Procedure: LEFT HEART CATH AND CORONARY ANGIOGRAPHY;  Surgeon: Wellington Hampshire, MD;  Location: [facility];  Service: Cardiovascular;  Laterality: Left;  . PROSTATE SURGERY     urethra grew around prostate   . TUMOR REMOVAL  2001   chest thymic cyst; behind lungs [facility] benign     Current Outpatient Medications  Medication Sig Dispense Refill  . albuterol (VENTOLIN HFA) 108 (90 Base) MCG/ACT inhaler Inhale 1 puff into the lungs every 6 (six) hours as needed for wheezing or shortness of breath.    . ALPRAZolam (XANAX) 0.5 MG tablet Take 1 tablet (0.5 mg total) by mouth 3 (three) times daily as needed. for anxiety 90 tablet 5  . amiodarone (PACERONE) 200 MG tablet Take 1.5 tablets (300 mg total) by mouth daily. 135 tablet 3  . aspirin 81 MG EC tablet Take 1 tablet (81 mg total) by mouth daily. 30 tablet 6  .  atorvastatin (LIPITOR) 80 MG tablet Take 1 tablet (80 mg total) by mouth daily. 90 tablet 0  . carvedilol (COREG) 3.125 MG tablet Take 1 tablet (3.125 mg total) by mouth 2 (two) times daily. 180 tablet 3  . clopidogrel (PLAVIX) 75 MG tablet Take 1 tablet (75 mg total) by mouth daily. 90 tablet 3  . ezetimibe (ZETIA) 10 MG tablet Take 1 tablet (10 mg total) by mouth daily. 90 tablet 3  . finasteride (PROSCAR) 5 MG tablet Take 5 mg by mouth daily.    . furosemide (LASIX) 40 MG tablet Take 1 tablet by mouth twice daily 180 tablet 0  . ipratropium-albuterol (DUONEB) 0.5-2.5 (3) MG/3ML SOLN Take 3 mLs by nebulization every 6 (six) hours as needed. (Patient not taking: Reported on 08/19/2019) 360 mL 0  . potassium chloride (KLOR-CON 10) 10 MEQ tablet Take 1 tablet (10 mEq total) by  mouth daily. 90 tablet 3  . sacubitril-valsartan (ENTRESTO) 97-103 MG Take 1 tablet by mouth 2 (two) times daily. 180 tablet 3  . tadalafil (CIALIS) 10 MG tablet Take 1 tablet (10 mg total) by mouth daily as needed for erectile dysfunction (take 30 min prior to when needed.). 10 tablet 3  . Tiotropium Bromide-Olodaterol (STIOLTO RESPIMAT) 2.5-2.5 MCG/ACT AERS Inhale 2 puffs into the lungs daily. 4 g 0  . varenicline ([name] STARTING MONTH PAK) 0.5 MG X 11 & 1 MG X 42 tablet Take 0.5 mg by mouth 2 (two) times daily.      No current facility-administered medications for this visit.    Allergies as of 01/25/2020 - Review Complete 12/28/2019  Allergen Reaction Noted  . Nitroglycerin Nausea Only and Other (See Comments) 01/23/2012    Family History  Problem Relation Age of Onset  . Cancer Father        larynx  . Cancer - Lung Mother   . Hypertension Mother   . Heart attack Mother   . Dementia Maternal Grandmother   . Heart attack Paternal Grandfather     Social History   Socioeconomic History  . Marital status: Married    Spouse name: Ian Mills   . Number of children: 2  . Years of education: Not on file  . Highest education level: Not on file  Occupational History  . Occupation: retired   Tobacco Use  . Smoking status: Former Smoker    Packs/day: 0.25    Years: 42.00    Pack years: 10.50    Types: Cigarettes    Quit date: 11/10/2019    Years since quitting: 0.2  . Smokeless tobacco: Never Used  Vaping Use  . Vaping Use: Never used  Substance and Sexual Activity  . Alcohol use: Yes    Alcohol/week: 1.0 standard drink    Types: 1 Cans of beer per week    Comment: per week  . Drug use: No  . Sexual activity: Not on file  Other Topics Concern  . 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    Social Determinants of Health   Financial Resource Strain: High Risk  . Difficulty of Paying Living Expenses: Hard  Food Insecurity: No Food Insecurity  . Worried About Charity fundraiser in the Last Year:  Never true  . Ran Out of Food in the Last Year: Never true  Transportation Needs: No Transportation Needs  . Lack of Transportation (Medical): No  . Lack of Transportation (Non-Medical): No  Physical Activity:   . Days of Exercise per Week:   . Minutes of Exercise per Session:   Stress: No Stress Concern Present  . Feeling of Stress : Not at all  Social Connections:   . Frequency of Communication with Friends and Family:   . Frequency of Social Gatherings with Friends and Family:   . Attends Religious Services:   . Active Member of Clubs or Organizations:   . Attends Archivist Meetings:   Marland Kitchen Marital Status:   Intimate Partner Violence: Not At Risk  . Fear of Current or Ex-Partner: No  . Emotionally Abused: No  . Physically Abused: No  . Sexually Abused: No    Review of Systems:    Constitutional: No weight loss, fever or chills Skin: No rash Cardiovascular: No chest pain Respiratory: No SOB  Gastrointestinal: See HPI and otherwise negative Genitourinary: No dysuria Neurological: No headache, dizziness or syncope Musculoskeletal: No new muscle or joint pain Hematologic: No bleeding Psychiatric: No history of depression or anxiety   Physical Exam:  Vital signs: BP 128/72   Pulse 80   Ht 5\' 8"  (1.727 m)   Wt 205 lb 2 oz (93 kg)   BMI 31.19 kg/m   Constitutional:   Pleasant overweight Caucasian male appears to be in NAD, Well developed, Well nourished, alert and cooperative Head:  Normocephalic and atraumatic. Eyes:   PEERL, EOMI. No icterus. Conjunctiva pink. Ears:  Normal auditory acuity. Neck:  Supple Throat: Oral cavity and pharynx without inflammation, swelling or lesion.  Respiratory: Respirations even and unlabored. Lungs clear  to auscultation bilaterally.   No wheezes, crackles, or rhonchi.  Cardiovascular: Normal S1, S2. No MRG. Regular rate and rhythm. No peripheral edema, cyanosis or pallor.  Gastrointestinal:  Soft, nondistended, nontender. No rebound or guarding. Normal bowel sounds. No appreciable masses or hepatomegaly. Rectal:  Not performed.  Msk:  Symmetrical without gross deformities. Without edema, no deformity or joint abnormality.  Neurologic:  Alert and  oriented x4;  grossly normal neurologically.  Skin:   Dry and intact without significant lesions or rashes. Psychiatric:  Demonstrates good judgement and reason without abnormal affect or behaviors.  RELEVANT LABS AND IMAGING: CBC    Component Value Date/Time   WBC 10.4 08/23/2019 1247   RBC 4.99 08/23/2019 1247   HGB 14.2 08/23/2019 1247   HGB CANCELED 08/18/2019 1139   HCT 43.9 08/23/2019 1247   HCT CANCELED 08/18/2019 1139   PLT 204 08/23/2019 1247   PLT CANCELED 08/18/2019 1139   MCV 88.0 08/23/2019 1247   MCV 94 05/30/2014 0455   MCH 28.5 08/23/2019 1247   MCHC 32.3 08/23/2019 1247   RDW 15.2 08/23/2019 1247   RDW 14.4 05/30/2014 0455   LYMPHSABS 2.4 06/10/2019 0955   LYMPHSABS 1.9 05/30/2014 0455   MONOABS 0.7 06/10/2019 0955   MONOABS 0.8 05/30/2014 0455   EOSABS 0.1 06/10/2019 0955   EOSABS 0.1 05/30/2014 0455   BASOSABS 0.1 06/10/2019 0955   BASOSABS 0.1 05/30/2014 0455    CMP     Component Value Date/Time   NA 135 08/23/2019 1247   NA 139 08/18/2019 1139   NA 137 05/30/2014 0455   K 4.4 08/23/2019 1247   K 3.9 05/30/2014 0455   CL 104 08/23/2019 1247   CL  106 05/30/2014 0455   CO2 24 08/23/2019 1247   CO2 26 05/30/2014 0455   GLUCOSE 90 08/23/2019 1247   GLUCOSE 104 (H) 05/30/2014 0455   BUN 21 08/23/2019 1247   BUN 37 (H) 08/18/2019 1139   BUN 12 05/30/2014 0455   CREATININE 1.53 (H) 08/23/2019 1247   CREATININE 1.15 05/30/2014 0455   CALCIUM 8.9 08/23/2019 1247   CALCIUM 8.1 (L) 05/30/2014 0455   PROT 7.1  08/23/2019 1453   PROT 7.2 04/22/2017 1007   PROT 7.2 05/29/2014 1136   ALBUMIN 3.8 08/23/2019 1453   ALBUMIN 4.4 04/22/2017 1007   ALBUMIN 3.6 05/29/2014 1136   AST 26 08/23/2019 1453   AST 21 05/29/2014 1136   ALT 41 08/23/2019 1453   ALT 24 05/29/2014 1136   ALKPHOS 107 08/23/2019 1453   ALKPHOS 116 05/29/2014 1136   BILITOT 0.8 08/23/2019 1453   BILITOT 0.4 04/22/2017 1007   BILITOT 0.5 05/29/2014 1136   GFRNONAA 47 (L) 08/23/2019 1247   GFRNONAA >60 05/29/2014 1136   GFRNONAA >60 06/30/2013 0637   GFRAA 54 (L) 08/23/2019 1247   GFRAA >60 05/29/2014 1136   GFRAA >60 06/30/2013 8338    Assessment: 1.  Screening for colorectal cancer: Patient is 56 and has never had a screening colonoscopy 2.  Chronic anticoagulation for CAD status post stenting: With Plavix 3.  Heart failure decreased EF: Last documented in 2019 30%  Plan: 1.  Discussed with patient that he will be scheduled at the hospital due to his decreased ejection fraction.  We do not yet have a time for this.  Will put him on Dr. Doyne Keel list for next available.  Did discuss risks, benefits, limitations and alternatives and the patient agrees to proceed. 2.  Patient was advised to hold his Plavix for 5 days prior to time of procedure.  We will discuss with Dr. Aundra Dubin to ensure that holding his Plavix is acceptable for him. 3.  Patient to follow in clinic per recommendations from Dr. Havery Moros after time of procedure.  Ellouise Newer, PA-C [facility] 01/25/2020, 9:16 AM  Cc: McLean-Scocuzza, Olivia Mackie *

## 2020-01-26 NOTE — Progress Notes (Signed)
Agree with assessment and plan as outlined. Ian Mills, this patient needs a hospital colonoscopy, I added him to the wait list. Thanks

## 2020-01-27 ENCOUNTER — Other Ambulatory Visit: Payer: Self-pay

## 2020-01-27 MED ORDER — AMIODARONE HCL 200 MG PO TABS: 300.0000 mg | ORAL_TABLET | Freq: Every day | ORAL | 3 refills | Status: DC

## 2020-02-04 ENCOUNTER — Other Ambulatory Visit: Payer: Self-pay | Admitting: Cardiovascular Disease

## 2020-02-04 DIAGNOSIS — I25118 Atherosclerotic heart disease of native coronary artery with other forms of angina pectoris: Secondary | ICD-10-CM

## 2020-02-07 ENCOUNTER — Telehealth: Payer: Self-pay | Admitting: *Deleted

## 2020-02-07 DIAGNOSIS — Z87891 Personal history of nicotine dependence: Secondary | ICD-10-CM

## 2020-02-07 DIAGNOSIS — Z122 Encounter for screening for malignant neoplasm of respiratory organs: Secondary | ICD-10-CM

## 2020-02-07 NOTE — Telephone Encounter (Signed)
Patient has been notified that annual lung cancer screening low dose CT scan is due currently or will be in near future. Confirmed that patient is within the age range of 55-77, and asymptomatic, (no signs or symptoms of lung cancer). Patient denies illness that would prevent curative treatment for lung cancer if found. Verified smoking history, (current, 42.1 pack year). The shared decision making visit was done 01/19/19. Patient is agreeable for CT scan being scheduled.

## 2020-02-16 ENCOUNTER — Ambulatory Visit
Admission: RE | Admit: 2020-02-16 | Discharge: 2020-02-16 | Disposition: A | Discharge status: Home / Self Care | Payer: Medicare Other | Source: Ambulatory Visit | Attending: Oncology | Admitting: Oncology

## 2020-02-16 ENCOUNTER — Other Ambulatory Visit: Payer: Self-pay

## 2020-02-16 DIAGNOSIS — Z122 Encounter for screening for malignant neoplasm of respiratory organs: Secondary | ICD-10-CM | POA: Insufficient documentation

## 2020-02-16 DIAGNOSIS — Z87891 Personal history of nicotine dependence: Secondary | ICD-10-CM | POA: Insufficient documentation

## 2020-02-18 ENCOUNTER — Encounter: Payer: Self-pay | Admitting: *Deleted

## 2020-02-20 ENCOUNTER — Encounter: Payer: Self-pay | Admitting: Internal Medicine

## 2020-02-20 DIAGNOSIS — I700 Atherosclerosis of aorta: Secondary | ICD-10-CM | POA: Insufficient documentation

## 2020-02-28 ENCOUNTER — Telehealth: Payer: Self-pay | Admitting: Internal Medicine

## 2020-02-28 NOTE — Telephone Encounter (Signed)
She Tdap if pt agreeable next visit can have done as well  Make sure pt has another visit upcoming for f/u in person w/in the next 3-6 months  Thanks Gerty

## 2020-02-28 NOTE — Telephone Encounter (Signed)
-----   Message from Delorise Jackson, MD sent at 12/28/2019  4:28 PM EDT ----- Check tdap vaccine in ncir

## 2020-02-28 NOTE — Telephone Encounter (Signed)
Shot is not documented in [employer]

## 2020-02-29 NOTE — Telephone Encounter (Signed)
Left message to return call. Please schedule Patient for follow-up appointment if calling back in. We can get him up to date on vaccines then.

## 2020-03-01 ENCOUNTER — Other Ambulatory Visit: Payer: Self-pay

## 2020-03-03 ENCOUNTER — Ambulatory Visit (INDEPENDENT_AMBULATORY_CARE_PROVIDER_SITE_OTHER): Payer: Medicare Other | Admitting: Internal Medicine

## 2020-03-03 ENCOUNTER — Encounter: Payer: Self-pay | Admitting: Internal Medicine

## 2020-03-03 ENCOUNTER — Telehealth: Payer: Self-pay | Admitting: Internal Medicine

## 2020-03-03 ENCOUNTER — Other Ambulatory Visit: Payer: Self-pay

## 2020-03-03 VITALS — BP 132/84 | HR 53 | Temp 98.2°F | Ht 67.99 in | Wt 210.2 lb

## 2020-03-03 DIAGNOSIS — H6123 Impacted cerumen, bilateral: Secondary | ICD-10-CM | POA: Diagnosis not present

## 2020-03-03 DIAGNOSIS — I25119 Atherosclerotic heart disease of native coronary artery with unspecified angina pectoris: Secondary | ICD-10-CM

## 2020-03-03 DIAGNOSIS — E782 Mixed hyperlipidemia: Secondary | ICD-10-CM

## 2020-03-03 DIAGNOSIS — E669 Obesity, unspecified: Secondary | ICD-10-CM | POA: Diagnosis not present

## 2020-03-03 DIAGNOSIS — I10 Essential (primary) hypertension: Secondary | ICD-10-CM | POA: Diagnosis not present

## 2020-03-03 DIAGNOSIS — M79672 Pain in left foot: Secondary | ICD-10-CM | POA: Diagnosis not present

## 2020-03-03 DIAGNOSIS — Z23 Encounter for immunization: Secondary | ICD-10-CM | POA: Diagnosis not present

## 2020-03-03 DIAGNOSIS — Z1389 Encounter for screening for other disorder: Secondary | ICD-10-CM

## 2020-03-03 DIAGNOSIS — Z125 Encounter for screening for malignant neoplasm of prostate: Secondary | ICD-10-CM

## 2020-03-03 DIAGNOSIS — R7303 Prediabetes: Secondary | ICD-10-CM

## 2020-03-03 NOTE — Patient Instructions (Addendum)
1800 quit now   Ice or voltaren gel left heel   https://www.cdc.gov/vaccines/hcp/vis/vis-statements/tdap.pdf">  Tdap (Tetanus, Diphtheria, Pertussis) Vaccine: What You Need to Know 1. Why get vaccinated? Tdap vaccine can prevent tetanus, diphtheria, and pertussis. Diphtheria and pertussis spread from person to person. Tetanus enters the body through cuts or wounds.  TETANUS (T) causes painful stiffening of the muscles. Tetanus can lead to serious health problems, including being unable to open the mouth, having trouble swallowing and breathing, or death.  DIPHTHERIA (D) can lead to difficulty breathing, heart failure, paralysis, or death.  PERTUSSIS (aP), also known as "whooping cough," can cause uncontrollable, violent coughing which makes it hard to breathe, eat, or drink. Pertussis can be extremely serious in babies and young children, causing pneumonia, convulsions, brain damage, or death. In teens and adults, it can cause weight loss, loss of bladder control, passing out, and rib fractures from severe coughing. 2. Tdap vaccine Tdap is only for children 7 years and older, adolescents, and adults.  Adolescents should receive a single dose of Tdap, preferably at age 71 or 80 years. Pregnant women should get a dose of Tdap during every pregnancy, to protect the newborn from pertussis. Infants are most at risk for severe, life-threatening complications from pertussis. Adults who have never received Tdap should get a dose of Tdap. Also, adults should receive a booster dose every 10 years, or earlier in the case of a severe and dirty wound or burn. Booster doses can be either Tdap or Td (a different vaccine that protects against tetanus and diphtheria but not pertussis). Tdap may be given at the same time as other vaccines. 3. Talk with your health care provider Tell your vaccine provider if the person getting the vaccine:  Has had an allergic reaction after a previous dose of any vaccine that  protects against tetanus, diphtheria, or pertussis, or has any severe, life-threatening allergies.  Has had a coma, decreased level of consciousness, or prolonged seizures within 7 days after a previous dose of any pertussis vaccine (DTP, DTaP, or Tdap).  Has seizures or another nervous system problem.  Has ever had Guillain-Barr Syndrome (also called GBS).  Has had severe pain or swelling after a previous dose of any vaccine that protects against tetanus or diphtheria. In some cases, your health care provider may decide to postpone Tdap vaccination to a future visit.  People with minor illnesses, such as a cold, may be vaccinated. People who are moderately or severely ill should usually wait until they recover before getting Tdap vaccine.  Your health care provider can give you more information. 4. Risks of a vaccine reaction  Pain, redness, or swelling where the shot was given, mild fever, headache, feeling tired, and nausea, vomiting, diarrhea, or stomachache sometimes happen after Tdap vaccine. People sometimes faint after medical procedures, including vaccination. Tell your provider if you feel dizzy or have vision changes or ringing in the ears.  As with any medicine, there is a very remote chance of a vaccine causing a severe allergic reaction, other serious injury, or death. 5. What if there is a serious problem? An allergic reaction could occur after the vaccinated person leaves the clinic. If you see signs of a severe allergic reaction (hives, swelling of the face and throat, difficulty breathing, a fast heartbeat, dizziness, or weakness), call 9-1-1 and get the person to the nearest hospital. For other signs that concern you, call your health care provider.  Adverse reactions should be reported to the Vaccine Adverse  Event Reporting System (VAERS). Your health care provider will usually file this report, or you can do it yourself. Visit the VAERS website at www.vaers.SamedayNews.es or call  902-307-5931. VAERS is only for reporting reactions, and VAERS staff do not give medical advice. 6. The National Vaccine Injury Compensation Program The [employer] Vaccine Injury Compensation Program (VICP) is a federal program that was created to compensate people who may have been injured by certain vaccines. Visit the VICP website at GoldCloset.com.ee or call (507)345-4840 to learn about the program and about filing a claim. There is a time limit to file a claim for compensation. 7. How can I learn more?  Ask your health care provider.  Call your local or state health department.  Contact the Centers for Disease Control and Prevention (CDC): ? Call 4173690283 (1-800-CDC-INFO) or ? Visit CDC's website at http://hunter.com/ Vaccine Information Statement Tdap (Tetanus, Diphtheria, Pertussis) Vaccine (10/28/2018) This information is not intended to replace advice given to you by your health care provider. Make sure you discuss any questions you have with your health care provider. Document Revised: 11/06/2018 Document Reviewed: 11/09/2018 Elsevier Patient Education  [place].   Smoking Tobacco Information, Adult Smoking tobacco can be harmful to your health. Tobacco contains a poisonous (toxic), colorless chemical called nicotine. Nicotine is addictive. It changes the brain and can make it hard to stop smoking. Tobacco also has other toxic chemicals that can hurt your body and raise your risk of many cancers. How can smoking tobacco affect me? Smoking tobacco puts you at risk for:  Cancer. Smoking is most commonly associated with lung cancer, but can also lead to cancer in other parts of the body.  Chronic obstructive pulmonary disease (COPD). This is a long-term lung condition that makes it hard to breathe. It also gets worse over time.  High blood pressure (hypertension), heart disease, stroke, or heart attack.  Lung infections, such as  pneumonia.  Cataracts. This is when the lenses in the eyes become clouded.  Digestive problems. This may include peptic ulcers, heartburn, and gastroesophageal reflux disease (GERD).  Oral health problems, such as gum disease and tooth loss.  Loss of taste and smell. Smoking can affect your appearance by causing:  Wrinkles.  Yellow or stained teeth, fingers, and fingernails. Smoking tobacco can also affect your social life, because:  It may be challenging to find places to smoke when away from home. Many workplaces, [employer], hotels, and public places are tobacco-free.  Smoking is expensive. This is due to the cost of tobacco and the long-term costs of treating health problems from smoking.  Secondhand smoke may affect those around you. Secondhand smoke can cause lung cancer, breathing problems, and heart disease. Children of smokers have a higher risk for: ? Sudden infant death syndrome (SIDS). ? Ear infections. ? Lung infections. If you currently smoke tobacco, quitting now can help you:  Lead a longer and healthier life.  Look, smell, breathe, and feel better over time.  Save money.  Protect others from the harms of secondhand smoke. What actions can I take to prevent health problems? Quit smoking   Do not start smoking. Quit if you already do.  Make a plan to quit smoking and commit to it. Look for programs to help you and ask your health care provider for recommendations and ideas.  Set a date and write down all the reasons you want to quit.  Let your friends and family know you are quitting so they can help and support  you. Consider finding friends who also want to quit. It can be easier to quit with someone else, so that you can support each other.  Talk with your health care provider about using nicotine replacement medicines to help you quit, such as gum, lozenges, patches, sprays, or pills.  Do not replace cigarette smoking with electronic cigarettes, which  are commonly called e-cigarettes. The safety of e-cigarettes is not known, and some may contain harmful chemicals.  If you try to quit but return to smoking, stay positive. It is common to slip up when you first quit, so take it one day at a time.  Be prepared for cravings. When you feel the urge to smoke, chew gum or suck on hard candy. Lifestyle  Stay busy and take care of your body.  Drink enough fluid to keep your urine pale yellow.  Get plenty of exercise and eat a healthy diet. This can help prevent weight gain after quitting.  Monitor your eating habits. Quitting smoking can cause you to have a larger appetite than when you smoke.  Find ways to relax. Go out with friends or family to a movie or a restaurant where people do not smoke.  Ask your health care provider about having regular tests (screenings) to check for cancer. This may include blood tests, imaging tests, and other tests.  Find ways to manage your stress, such as meditation, yoga, or exercise. Where to find support To get support to quit smoking, consider:  Asking your health care provider for more information and resources.  Taking classes to learn more about quitting smoking.  Looking for local organizations that offer resources about quitting smoking.  Joining a support group for people who want to quit smoking in your local community.  Calling the smokefree.gov counselor helpline: 1-800-Quit-Now (941) 663-0880) Where to find more information You may find more information about quitting smoking from:  HelpGuide.org: www.helpguide.org  https://hall.com/: smokefree.gov  American Lung Association: www.lung.org Contact a health care provider if you:  Have problems breathing.  Notice that your lips, nose, or fingers turn blue.  Have chest pain.  Are coughing up blood.  Feel faint or you pass out.  Have other health changes that cause you to worry. Summary  Smoking tobacco can negatively affect  your health, the health of those around you, your finances, and your social life.  Do not start smoking. Quit if you already do. If you need help quitting, ask your health care provider.  Think about joining a support group for people who want to quit smoking in your local community. There are many effective programs that will help you to quit this behavior. This information is not intended to replace advice given to you by your health care provider. Make sure you discuss any questions you have with your health care provider. Document Revised: 04/09/2019 Document Reviewed: 07/30/2016 Elsevier Patient Education  2020 [place] with Quitting Smoking  Quitting smoking is a physical and mental challenge. You will face cravings, withdrawal symptoms, and temptation. Before quitting, work with your health care provider to make a plan that can help you cope. Preparation can help you quit and keep you from giving in. How can I cope with cravings? Cravings usually last for 5-10 minutes. If you get through it, the craving will pass. Consider taking the following actions to help you cope with cravings:  Keep your mouth busy: ? Chew sugar-free gum. ? Suck on hard candies or a straw. ? Brush your teeth.  Keep your hands and body busy: ? Immediately change to a different activity when you feel a craving. ? Squeeze or play with a ball. ? Do an activity or a hobby, like making bead jewelry, practicing needlepoint, or working with wood. ? Mix up your normal routine. ? Take a short exercise break. Go for a quick walk or run up and down stairs. ? Spend time in public places where smoking is not allowed.  Focus on doing something kind or helpful for someone else.  Call a friend or family member to talk during a craving.  Join a support group.  Call a quit line, such as 1-800-QUIT-NOW.  Talk with your health care provider about medicines that might help you cope with cravings and make  quitting easier for you. How can I deal with withdrawal symptoms? Your body may experience negative effects as it tries to get used to not having nicotine in the system. These effects are called withdrawal symptoms. They may include:  Feeling hungrier than normal.  Trouble concentrating.  Irritability.  Trouble sleeping.  Feeling depressed.  Restlessness and agitation.  Craving a cigarette. To manage withdrawal symptoms:  Avoid places, people, and activities that trigger your cravings.  Remember why you want to quit.  Get plenty of sleep.  Avoid coffee and other caffeinated drinks. These may worsen some of your symptoms. How can I handle social situations? Social situations can be difficult when you are quitting smoking, especially in the first few weeks. To manage this, you can:  Avoid parties, bars, and other social situations where people might be smoking.  Avoid alcohol.  Leave right away if you have the urge to smoke.  Explain to your family and friends that you are quitting smoking. Ask for understanding and support.  Plan activities with friends or family where smoking is not an option. What are some ways I can cope with stress? Wanting to smoke may cause stress, and stress can make you want to smoke. Find ways to manage your stress. Relaxation techniques can help. For example:  Breathe slowly and deeply, in through your nose and out through your mouth.  Listen to soothing, relaxing music.  Talk with a family member or friend about your stress.  Light a candle.  Soak in a bath or take a shower.  Think about a peaceful place. What are some ways I can prevent weight gain? Be aware that many people gain weight after they quit smoking. However, not everyone does. To keep from gaining weight, have a plan in place before you quit and stick to the plan after you quit. Your plan should include:  Having healthy snacks. When you have a craving, it may help  to: ? Eat plain popcorn, crunchy carrots, celery, or other cut vegetables. ? Chew sugar-free gum.  Changing how you eat: ? Eat small portion sizes at meals. ? Eat 4-6 small meals throughout the day instead of 1-2 large meals a day. ? Be mindful when you eat. Do not watch television or do other things that might distract you as you eat.  Exercising regularly: ? Make time to exercise each day. If you do not have time for a long workout, do short bouts of exercise for 5-10 minutes several times a day. ? Do some form of strengthening exercise, like weight lifting, and some form of aerobic exercise, like running or swimming.  Drinking plenty of water or other low-calorie or no-calorie drinks. Drink 6-8 glasses of water daily, or as  much as instructed by your health care provider. Summary  Quitting smoking is a physical and mental challenge. You will face cravings, withdrawal symptoms, and temptation to smoke again. Preparation can help you as you go through these challenges.  You can cope with cravings by keeping your mouth busy (such as by chewing gum), keeping your body and hands busy, and making calls to family, friends, or a helpline for people who want to quit smoking.  You can cope with withdrawal symptoms by avoiding places where people smoke, avoiding drinks with caffeine, and getting plenty of rest.  Ask your health care provider about the different ways to prevent weight gain, avoid stres    s, and handle social situations. This information is not intended to replace advice given to you by your health care provider. Make sure you discuss any questions you have with your health care provider. Document Revised: 06/27/2017 Document Reviewed: 07/12/2016 Elsevier Patient Education  [place].   Plantar Fasciitis Rehab Ask your health care provider which exercises are safe for you. Do exercises exactly as told by your health care provider and adjust them as directed. It is  normal to feel mild stretching, pulling, tightness, or discomfort as you do these exercises. Stop right away if you feel sudden pain or your pain gets worse. Do not begin these exercises until told by your health care provider. Stretching and range-of-motion exercises These exercises warm up your muscles and joints and improve the movement and flexibility of your foot. These exercises also help to relieve pain. Plantar fascia stretch  1. Sit with your left / right leg crossed over your opposite knee. 2. Hold your heel with one hand with that thumb near your arch. With your other hand, hold your toes and gently pull them back toward the top of your foot. You should feel a stretch on the bottom of your toes or your foot (plantar fascia) or both. 3. Hold this stretch for__________ seconds. 4. Slowly release your toes and return to the starting position. Repeat __________ times. Complete this exercise __________ times a day. Gastrocnemius stretch, standing This exercise is also called a calf (gastroc) stretch. It stretches the muscles in the back of the upper calf. 1. Stand with your hands against a wall. 2. Extend your left / right leg behind you, and bend your front knee slightly. 3. Keeping your heels on the floor and your back knee straight, shift your weight toward the wall. Do not arch your back. You should feel a gentle stretch in your upper left / right calf. 4. Hold this position for __________ seconds. Repeat __________ times. Complete this exercise __________ times a day. Soleus stretch, standing This exercise is also called a calf (soleus) stretch. It stretches the muscles in the back of the lower calf. 1. Stand with your hands against a wall. 2. Extend your left / right leg behind you, and bend your front knee slightly. 3. Keeping your heels on the floor, bend your back knee and shift your weight slightly over your back leg. You should feel a gentle stretch deep in your lower  calf. 4. Hold this position for __________ seconds. Repeat __________ times. Complete this exercise __________ times a day. Gastroc and soleus stretch, standing step This exercise stretches the muscles in the back of the lower leg. These muscles are in the upper calf (gastrocnemius) and the lower calf (soleus). 1. Stand with the ball of your left / right foot on a step. The ball  of your foot is on the walking surface, right under your toes. 2. Keep your other foot firmly on the same step. 3. Hold on to the wall or a railing for balance. 4. Slowly lift your other foot, allowing your body weight to press your left / right heel down over the edge of the step. You should feel a stretch in your left / right calf. 5. Hold this position for __________ seconds. 6. Return both feet to the step. 7. Repeat this exercise with a slight bend in your left / right knee. Repeat __________ times with your left / right knee straight and __________ times with your left / right knee bent. Complete this exercise __________ times a day. Balance exercise This exercise builds your balance and strength control of your arch to help take pressure off your plantar fascia. Single leg stand If this exercise is too easy, you can try it with your eyes closed or while standing on a pillow. 1. Without shoes, stand near a railing or in a doorway. You may hold on to the railing or door frame as needed. 2. Stand on your left / right foot. Keep your big toe down on the floor and try to keep your arch lifted. Do not let your foot roll inward. 3. Hold this position for __________ seconds. Repeat __________ times. Complete this exercise __________ times a day. This information is not intended to replace advice given to you by your health care provider. Make sure you discuss any questions you have with your health care provider. Document Revised: 11/05/2018 Document Reviewed: 05/13/2018 Elsevier Patient Education  [place].

## 2020-03-03 NOTE — Telephone Encounter (Signed)
Lm to call office to make a 40m appt.

## 2020-03-03 NOTE — Progress Notes (Signed)
Chief Complaint  Patient presents with  . Medication Question    pt would like to discuss an alternative to [name] as he was told to discontinue his last prescription due to a dosing error  . Blood work    Pt is fasting and asks to do labs today as he does not like the [facility] in [place]   F/u  1. Tobacco abuse off chantix due to recall and no smoking x 4 months but wife smokes which is a trigger wellbutrin did not work in the past  2. Prediabetes/ HTN/HLD on lipitor 80 and zetia 10 and lasix 40, entresto 97-103, coreg 3.125 mg bid  BP controlled today will check fsating labs 3. Due for Tdap  4. C/o left heel pain worse in the am mild to moderate    Review of Systems  Constitutional: Negative for weight loss.  HENT: Negative for hearing loss.   Eyes: Negative for blurred vision.  Respiratory: Negative for shortness of breath.   Cardiovascular: Negative for chest pain.  Gastrointestinal: Negative for abdominal pain.  Musculoskeletal: Positive for joint pain.  Skin: Negative for rash.  Neurological: Negative for headaches.  Psychiatric/Behavioral: Negative for depression.   Past Medical History:  Diagnosis Date  . Coronary artery disease    a. 3 stents to the LAD, 1 to the PDA, and 1 to the OM3; b. cath 06/2013: chronically occluded LAD, patent stent LAD, D1 20%, pLCx 30%, mLCx 20%, pRCA 70% (FFR 0.74) s/p PCI/DES, mRCA 70% s/p PCI/DES, RPDA 50%, EF 20% w/ AK of anterolateral & apical walls, moderately elevated LVEDP  . HFrEF (heart failure with reduced ejection fraction) ([place])    a. 05/2014 Echo: EF 25-30%; b. 01/2018 Echo ([facility]): EF 30%.  Marland Kitchen HLD (hyperlipidemia)   . HTN (hypertension)   . ICD  single,BSX    a. DOI 11/2011; b. S/N# [id number]  . Ischemic cardiomyopathy    a. 05/2014 Echo: EF 25-30%; b. 01/2018 Echo ([facility]): EF 30%, sev glob HK w/ apical/ant AK. Mild LVH. Mod LAE, mild RAE. Triv MR/TR.  Marland Kitchen Pneumonia    08/2018  . Syncope and collapse   . Ventricular tachycardia ([place])  11/2011   a. s/p AICD implant    Past Surgical History:  Procedure Laterality Date  . appendectomy    . CARDIAC CATHETERIZATION  Nov 30 2011   [facility]  . CARDIAC DEFIBRILLATOR PLACEMENT  May 2013  . CORONARY ANGIOPLASTY  2014   x 2 stents  . CORONARY STENT PLACEMENT     x7  . LEFT HEART CATH AND CORONARY ANGIOGRAPHY Left 08/30/2019   Procedure: LEFT HEART CATH AND CORONARY ANGIOGRAPHY;  Surgeon: Wellington Hampshire, MD;  Location: [facility];  Service: Cardiovascular;  Laterality: Left;  . PROSTATE SURGERY     urethra grew around prostate   . TUMOR REMOVAL  2001   chest thymic cyst; behind lungs [facility] benign    Family History  Problem Relation Age of Onset  . Cancer Father        larynx  . Cancer - Lung Mother   . Hypertension Mother   . Heart attack Mother   . Dementia Maternal Grandmother   . Heart attack Paternal Grandfather   . Colon cancer Neg Hx   . Stomach cancer Neg Hx   . Pancreatic cancer Neg Hx    Social History   Socioeconomic History  . Marital status: Married    Spouse name: Hilda Blades   . Number of children: 2  .  Years of education: Not on file  . Highest education level: Not on file  Occupational History  . Occupation: retired   Tobacco Use  . Smoking status: Former Smoker    Packs/day: 0.25    Years: 42.00    Pack years: 10.50    Types: Cigarettes    Quit date: 11/10/2019    Years since quitting: 0.3  . Smokeless tobacco: Never Used  Vaping Use  . Vaping Use: Never used  Substance and Sexual Activity  . Alcohol use: Yes    Alcohol/week: 1.0 standard drink    Types: 1 Cans of beer per week    Comment: per week  . Drug use: No  . Sexual activity: Not on file  Other Topics Concern  . 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    Social Determinants  of Health   Financial Resource Strain: High Risk  . Difficulty of Paying Living Expenses: Hard  Food Insecurity: No Food Insecurity  . Worried About Charity fundraiser in the Last Year: Never true  . Ran Out of Food in the Last Year: Never true  Transportation Needs: No Transportation Needs  . Lack of Transportation (Medical): No  . Lack of Transportation (Non-Medical): No  Physical Activity:   . Days of Exercise per Week:   . Minutes of Exercise per Session:   Stress: No Stress Concern Present  . Feeling of Stress : Not at all  Social Connections:   . Frequency of Communication with Friends and Family:   . Frequency of Social Gatherings with Friends and Family:   . Attends Religious Services:   . Active Member of Clubs or Organizations:   . Attends Archivist Meetings:   Marland Kitchen Marital Status:   Intimate Partner Violence: Not At Risk  . Fear of Current or Ex-Partner: No  . Emotionally Abused: No  . Physically Abused: No  . Sexually Abused: No   Current Meds  Medication Sig  . albuterol (VENTOLIN HFA) 108 (90 Base) MCG/ACT inhaler Inhale 1 puff into the lungs every 6 (six) hours as needed for wheezing or shortness of breath.  . ALPRAZolam (XANAX) 0.5 MG tablet Take 1 tablet (0.5 mg total) by mouth 3 (three) times daily as needed. for anxiety  . amiodarone (PACERONE) 200 MG tablet Take 1.5 tablets (300 mg total) by mouth daily.  Marland Kitchen aspirin 81 MG EC tablet Take 1 tablet (81 mg total) by mouth daily.  Marland Kitchen atorvastatin (LIPITOR) 80 MG tablet Take 1 tablet (80 mg total) by mouth daily.  . carvedilol (COREG) 3.125 MG tablet Take 1 tablet (3.125 mg total) by mouth 2 (two) times daily.  . clopidogrel (PLAVIX) 75 MG tablet Take 1 tablet (75 mg total) by mouth daily.  Marland Kitchen ezetimibe (ZETIA) 10 MG tablet Take 1 tablet (10 mg total) by mouth daily.  . finasteride (PROSCAR) 5 MG tablet Take 5 mg by mouth daily.  . furosemide (LASIX) 40 MG tablet Take 1 tablet by mouth twice daily  .  ipratropium-albuterol (DUONEB) 0.5-2.5 (3) MG/3ML SOLN Take 3 mLs by nebulization every 6 (six) hours as needed.  . potassium chloride (KLOR-CON 10) 10 MEQ tablet Take 1 tablet (10 mEq total) by mouth  daily.  . sacubitril-valsartan (ENTRESTO) 97-103 MG Take 1 tablet by mouth 2 (two) times daily.  . Tiotropium Bromide-Olodaterol (STIOLTO RESPIMAT) 2.5-2.5 MCG/ACT AERS Inhale 2 puffs into the lungs daily.  . varenicline ([name] STARTING MONTH PAK) 0.5 MG X 11 & 1 MG X 42 tablet Take 0.5 mg by mouth 2 (two) times daily.    Allergies  Allergen Reactions  . 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    Pulse Gen Serial Number [id number]    Clinic Name [facility]    Implantable Pulse Generator Type Implantable Cardiac Defibulator    Implantable Pulse Generator Implant Date [id number]    Implantable Lead Manufacturer BOST    Implantable Lead Model 0292 [employer]ak Reliance 4-Site SG    Implantable Lead Serial Number [id number]    Implantable Lead Implant Date [id number]    Implantable Lead Location [phone]    Lead Channel Setting Sensing Sensitivity 0.6 mV   Lead Channel Setting Sensing Adaptation Mode Adaptive Sensing    Lead Channel Setting Pacing Pulse Width 0.5 ms   Lead Channel Setting Pacing Amplitude 2.4 V   Lead Channel Impedance Value 599 ohm   Lead Channel Pacing Threshold Amplitude 0.8 V   Lead Channel Pacing Threshold Pulse Width 0.5 ms   HighPow Impedance 82 ohm   Battery Status BOS    Battery Remaining Longevity 66 mo   Battery Remaining Percentage 68 %   Brady Statistic RV Percent Paced 0 %   Objective  Body mass index is 31.97 kg/m. Wt Readings from Last 3  Encounters:  03/03/20 210 lb 3.2 oz (95.3 kg)  02/16/20 200 lb (90.7 kg)  01/25/20 205 lb 2 oz (93 kg)   Temp Readings from Last 3 Encounters:  03/03/20 98.2 F (36.8 C)  08/30/19 97.9 F (36.6 C) (Oral)  08/23/19 98.3 F (36.8 C) (Oral)   BP Readings from Last 3 Encounters:  03/03/20 132/84  01/25/20 128/72  12/28/19 130/90   Pulse Readings from Last 3 Encounters:  03/03/20 (!) 53  01/25/20 80  09/07/19 (!) 57    Physical Exam Vitals and nursing note reviewed.  Constitutional:      Appearance: Normal appearance. He is well-developed and well-groomed. He is obese.  HENT:     Head: Normocephalic and atraumatic.     Right Ear: There is impacted cerumen.     Left Ear: There is impacted cerumen.  Eyes:     Conjunctiva/sclera: Conjunctivae normal.     Pupils: Pupils are equal, round, and reactive to light.  Cardiovascular:     Rate and Rhythm: Normal rate and regular rhythm.     Heart sounds: Normal heart sounds. No murmur heard.   Pulmonary:     Effort: Pulmonary effort is normal.     Breath sounds: Normal breath sounds.  Abdominal:     General: Abdomen is flat. Bowel sounds are normal.  Skin:    General: Skin is warm and dry.  Neurological:     General: No focal deficit present.     Mental Status: He is alert and oriented to person, place, and time. Mental  status is at baseline.     Gait: Gait normal.  Psychiatric:        Attention and Perception: Attention and perception normal.        Mood and Affect: Mood and affect normal.        Speech: Speech normal.        Behavior: Behavior normal. Behavior is cooperative.        Thought Content: Thought content normal.        Cognition and Memory: Cognition and memory normal.        Judgment: Judgment normal.     Assessment  Plan  Essential hypertension - Plan: Comprehensive metabolic panel, Lipid panel, CBC with Differential/Platelet, Urinalysis, Routine w reflex microscopic, Microalbumin / creatinine urine  ratio Cont meds   Prediabetes - Plan: Hemoglobin A1c, Microalbumin / creatinine urine ratio  Mixed hyperlipidemia Coronary artery disease involving native coronary artery of native heart with angina pectoris ([place]) F/u cards   Left heel pain likely plantar fascitis  Given stretches let me know in future if wants to see podiatry in [place]   Immunization due - Plan: Tdap vaccine greater than or equal to 7yo IM  Obesity (BMI 30-39.9) rec healthy diet and exercise   Bilateral impacted cerumen  Consented b/l wax removed both ears with currette and most wax removed  Tolerated today most wax removed   HM -fasting labs today Flu shotutd pna 23 utd  prevnar due in future covid vaccine 1/2 2nd 01/12/20  Tdap today Consider shingrix in the future  hcv 06/29/97 negative   Skin no issues  Colonoscopy [facility] in [place] -04/2020 scheduled  PSA sees Dr. Vernice [facility] urology appt sch 05/2020 he is retiring 09/2019  -will refer 03/2020 for appt 05/2020 with [facility] Dr. Vicenta Dunning  Smoker CT chest 01/19/19 COPD still smoking rec cessation smoking <1 ppd some times <1/2 ppd or 5-6 cig qd since age 50 y.o smoker and quit x 5 year FH lung cancer in mother trying to get help with chantix  As of 12/28/19 quit smoking x 5 weeks congratulated   [place] cerumen impaction hearing loss with hearing aids improved after wax removal   Cards Dr. Rockey Situ and EP Dr. Jolyn Nap  Provider: Dr. Olivia Mackie McLean-Scocuzza-Internal Medicine

## 2020-03-03 NOTE — Telephone Encounter (Signed)
Lm to call office to schedule a 33m follow up

## 2020-03-04 LAB — CBC WITH DIFFERENTIAL/PLATELET
Absolute Monocytes: 570 cells/uL (ref 200–950)
Basophils Absolute: 31 cells/uL (ref 0–200)
Basophils Relative: 0.5 %
Eosinophils Absolute: 99 cells/uL (ref 15–500)
Eosinophils Relative: 1.6 %
HCT: 45.8 % (ref 38.5–50.0)
Hemoglobin: 15.3 g/dL (ref 13.2–17.1)
Lymphs Abs: 2666 cells/uL (ref 850–3900)
MCH: 30.1 pg (ref 27.0–33.0)
MCHC: 33.4 g/dL (ref 32.0–36.0)
MCV: 90 fL (ref 80.0–100.0)
MPV: 10.6 fL (ref 7.5–12.5)
Monocytes Relative: 9.2 %
Neutro Abs: 2833 cells/uL (ref 1500–7800)
Neutrophils Relative %: 45.7 %
Platelets: 172 10*3/uL (ref 140–400)
RBC: 5.09 10*6/uL (ref 4.20–5.80)
RDW: 13.2 % (ref 11.0–15.0)
Total Lymphocyte: 43 %
WBC: 6.2 10*3/uL (ref 3.8–10.8)

## 2020-03-04 LAB — COMPREHENSIVE METABOLIC PANEL
AG Ratio: 1.5 (calc) (ref 1.0–2.5)
ALT: 34 U/L (ref 9–46)
AST: 22 U/L (ref 10–35)
Albumin: 4.4 g/dL (ref 3.6–5.1)
Alkaline phosphatase (APISO): 97 U/L (ref 35–144)
BUN/Creatinine Ratio: 17 (calc) (ref 6–22)
BUN: 25 mg/dL (ref 7–25)
CO2: 22 mmol/L (ref 20–32)
Calcium: 9 mg/dL (ref 8.6–10.3)
Chloride: 103 mmol/L (ref 98–110)
Creat: 1.46 mg/dL — ABNORMAL HIGH (ref 0.70–1.25)
Globulin: 2.9 g/dL (calc) (ref 1.9–3.7)
Glucose, Bld: 77 mg/dL (ref 65–99)
Potassium: 4.6 mmol/L (ref 3.5–5.3)
Sodium: 138 mmol/L (ref 135–146)
Total Bilirubin: 0.6 mg/dL (ref 0.2–1.2)
Total Protein: 7.3 g/dL (ref 6.1–8.1)

## 2020-03-04 LAB — HEMOGLOBIN A1C
Hgb A1c MFr Bld: 5.4 % of total Hgb (ref <5.7)
Mean Plasma Glucose: 108 (calc)
eAG (mmol/L): 6 (calc)

## 2020-03-04 LAB — LIPID PANEL
Cholesterol: 146 mg/dL (ref <200)
HDL: 56 mg/dL (ref >=40)
LDL Cholesterol (Calc): 72 mg/dL (calc)
Non-HDL Cholesterol (Calc): 90 mg/dL (calc) (ref <130)
Total CHOL/HDL Ratio: 2.6 (calc) (ref <5.0)
Triglycerides: 92 mg/dL (ref <150)

## 2020-03-04 LAB — MICROALBUMIN / CREATININE URINE RATIO
Creatinine, Urine: 152 mg/dL (ref 20–320)
Microalb Creat Ratio: 5 mcg/mg creat (ref <30)
Microalb, Ur: 0.8 mg/dL

## 2020-03-04 LAB — URINALYSIS, ROUTINE W REFLEX MICROSCOPIC
Bilirubin Urine: NEGATIVE
Glucose, UA: NEGATIVE
Hgb urine dipstick: NEGATIVE
Ketones, ur: NEGATIVE
Leukocytes,Ua: NEGATIVE
Nitrite: NEGATIVE
Protein, ur: NEGATIVE
Specific Gravity, Urine: 1.022 (ref 1.001–1.03)
pH: 6 (ref 5.0–8.0)

## 2020-03-06 ENCOUNTER — Ambulatory Visit (INDEPENDENT_AMBULATORY_CARE_PROVIDER_SITE_OTHER): Payer: Medicare Other | Admitting: Pharmacist

## 2020-03-06 DIAGNOSIS — I25119 Atherosclerotic heart disease of native coronary artery with unspecified angina pectoris: Secondary | ICD-10-CM

## 2020-03-06 NOTE — Patient Instructions (Signed)
Visit Information  Goals Addressed              This Visit's Progress     Patient Stated   .  "I want to quit smoking" (pt-stated)        CARE PLAN ENTRY (see longtitudinal plan of care for additional care plan information)  Current Barriers:  . Polypharmacy, complex patient w/ multiple comorbidities including COPD, CHF, CAD, tobacco abuse o [name] recall and backorder has affected patient. He is now w/o therapy. Has been quit for ~4 months . Tobacco abuse of ~40 years;  o Patient CONFIRMS he has remained tobacco free since ~11/09/19 o APPROVED for [name] assistance through 07/27/2020 . CAD s/p prior stenting; ASA 81 mg daily, clopidogrel 75 mg daily; atorvastatin 80 mg daily, ezetimibe 10 mg daily;  . HFrEF s/p AICD placement (most recent EF 25-30%); Entresto 97/103 mg BID, carvedilol 3.125 mg BID, furosemide 80 mg daily, potassium 10 mEq daily o APPROVED for Entresto assistance through 08/11/2020 . Ventricular tachycardia: amiodarone 300 mg daily; . COPD: Stiolto 2.5/2.5 mg daily 2 puffs daily; no need for rescue albuterol since stopping smoking. o APPROVED for Stiolto assistance through 07/28/20  Pharmacist Clinical Goal(s):  Marland Kitchen Over the next 90 days, patient will work with multidisciplinary team on medication access.  Interventions: . Contacted [employer] PAP. Unfortunately, they confirm that they still do not have [name] in stock, and do not offer any solutions to help patients obtain apo-varenicline.  Marland Kitchen [facility] to investigate any other solutions. They suggested I review w/ patient why he does not have a Medicare Part D program. Will discuss w/ patient at f/u call later this week  Patient Self Care Activities:  . Patient will commit to remaining tobacco-free  Please see past updates related to this goal by clicking on the "Past Updates" button in the selected goal         The patient verbalized understanding of instructions provided today  and declined a print copy of patient instruction materials.   Plan:  - Will outreach patient later this week as previously scheduled  Catie Darnelle Maffucci, PharmD, [place], [place] Pharmacist [place] [phone]

## 2020-03-06 NOTE — Chronic Care Management (AMB) (Signed)
Chronic Care Management   Follow Up Note   03/06/2020 Name: ZAKK BORGEN MRN: [id number] DOB: [birth year]  Referred by: McLean-Scocuzza, Nino Glow, MD Reason for referral : Chronic Care Management (Medication Management)   AUGUSTE TEBBETTS is a 64 y.o. year old male who is a primary care patient of McLean-Scocuzza, Nino Glow, MD. The CCM team was consulted for assistance with chronic disease management and care coordination needs.    Contacted patient for medication management review.  Review of patient status, including review of consultants reports, relevant laboratory and other test results, and collaboration with appropriate care team members and the patient's provider was performed as part of comprehensive patient evaluation and provision of chronic care management services.    SDOH (Social Determinants of Health) assessments performed: Yes See Care Plan activities for detailed interventions related to SDOH)  SDOH Interventions     Most Recent Value  SDOH Interventions  Financial Strain Interventions Other (Comment)  [medication assistance programs]       Outpatient Encounter Medications as of 03/06/2020  Medication Sig  . albuterol (VENTOLIN HFA) 108 (90 Base) MCG/ACT inhaler Inhale 1 puff into the lungs every 6 (six) hours as needed for wheezing or shortness of breath.  . ALPRAZolam (XANAX) 0.5 MG tablet Take 1 tablet (0.5 mg total) by mouth 3 (three) times daily as needed. for anxiety  . amiodarone (PACERONE) 200 MG tablet Take 1.5 tablets (300 mg total) by mouth daily.  Marland Kitchen aspirin 81 MG EC tablet Take 1 tablet (81 mg total) by mouth daily.  Marland Kitchen atorvastatin (LIPITOR) 80 MG tablet Take 1 tablet (80 mg total) by mouth daily.  . carvedilol (COREG) 3.125 MG tablet Take 1 tablet (3.125 mg total) by mouth 2 (two) times daily.  . clopidogrel (PLAVIX) 75 MG tablet Take 1 tablet (75 mg total) by mouth daily.  Marland Kitchen ezetimibe (ZETIA) 10 MG tablet Take 1 tablet (10 mg total) by mouth daily.  .  finasteride (PROSCAR) 5 MG tablet Take 5 mg by mouth daily.  . furosemide (LASIX) 40 MG tablet Take 1 tablet by mouth twice daily  . ipratropium-albuterol (DUONEB) 0.5-2.5 (3) MG/3ML SOLN Take 3 mLs by nebulization every 6 (six) hours as needed.  . potassium chloride (KLOR-CON 10) 10 MEQ tablet Take 1 tablet (10 mEq total) by mouth daily.  . sacubitril-valsartan (ENTRESTO) 97-103 MG Take 1 tablet by mouth 2 (two) times daily.  . tadalafil (CIALIS) 10 MG tablet Take 1 tablet (10 mg total) by mouth daily as needed for erectile dysfunction (take 30 min prior to when needed.).  Marland Kitchen Tiotropium Bromide-Olodaterol (STIOLTO RESPIMAT) 2.5-2.5 MCG/ACT AERS Inhale 2 puffs into the lungs daily.  . varenicline ([name] STARTING MONTH PAK) 0.5 MG X 11 & 1 MG X 42 tablet Take 0.5 mg by mouth 2 (two) times daily.    No facility-administered encounter medications on file as of 03/06/2020.     Objective:   Goals Addressed              This Visit's Progress     Patient Stated   .  "I want to quit smoking" (pt-stated)        CARE PLAN ENTRY (see longtitudinal plan of care for additional care plan information)  Current Barriers:  . Polypharmacy, complex patient w/ multiple comorbidities including COPD, CHF, CAD, tobacco abuse o [name] recall and backorder has affected patient. He is now w/o therapy. Has been quit for ~4 months . Tobacco abuse of ~40 years;  o  Patient CONFIRMS he has remained tobacco free since ~11/09/19 o APPROVED for [name] assistance through 07/27/2020 . CAD s/p prior stenting; ASA 81 mg daily, clopidogrel 75 mg daily; atorvastatin 80 mg daily, ezetimibe 10 mg daily;  . HFrEF s/p AICD placement (most recent EF 25-30%); Entresto 97/103 mg BID, carvedilol 3.125 mg BID, furosemide 80 mg daily, potassium 10 mEq daily o APPROVED for Entresto assistance through 08/11/2020 . Ventricular tachycardia: amiodarone 300 mg daily; . COPD: Stiolto 2.5/2.5 mg daily 2 puffs daily; no need for rescue  albuterol since stopping smoking. o APPROVED for Stiolto assistance through 07/28/20  Pharmacist Clinical Goal(s):  Marland Kitchen Over the next 90 days, patient will work with multidisciplinary team on medication access.  Interventions: . Contacted [employer] PAP. Unfortunately, they confirm that they still do not have [name] in stock, and do not offer any solutions to help patients obtain apo-varenicline.  Marland Kitchen [facility] to investigate any other solutions. They suggested I review w/ patient why he does not have a Medicare Part D program. Will discuss w/ patient at f/u call later this week  Patient Self Care Activities:  . Patient will commit to remaining tobacco-free  Please see past updates related to this goal by clicking on the "Past Updates" button in the selected goal          Plan:  - Will outreach patient later this week as previously scheduled  Catie Darnelle Maffucci, PharmD, [place], [place] Pharmacist [place] [phone]

## 2020-03-07 NOTE — Telephone Encounter (Signed)
Patient scheduled for 05/2020.

## 2020-03-09 ENCOUNTER — Ambulatory Visit: Payer: Medicare Other | Admitting: Pharmacist

## 2020-03-09 DIAGNOSIS — E782 Mixed hyperlipidemia: Secondary | ICD-10-CM

## 2020-03-09 DIAGNOSIS — I25119 Atherosclerotic heart disease of native coronary artery with unspecified angina pectoris: Secondary | ICD-10-CM | POA: Diagnosis not present

## 2020-03-09 DIAGNOSIS — I255 Ischemic cardiomyopathy: Secondary | ICD-10-CM

## 2020-03-09 NOTE — Patient Instructions (Addendum)
Mr. Southwell,   It was great talking with you today!  Start weighing yourself every morning. This is important to do with heart failure. If you gain more than 3 lbs overnight or 5 lbs in a week, you need to call Dr. Donivan Scull office for further instruction.   I've included the paper version of the Medicare Extra Help application so that you know what all information I will need to know on Monday. It will process faster if we completed on line, so don't worry about actually completing this paperwork.   [place] [employer]) can help you navigate the process of signing up for a prescription insurance plan.   [place] (Ask for [facility] Help)    Available Mon-Thurs, 8:30-4:00, No apt necessary [place]     [place]   [phone]      [phone]  Talk to you Monday!  Catie Darnelle Maffucci, PharmD [phone]  Visit Information  Goals Addressed              This Visit's Progress     Patient Stated   .  "I want to quit smoking" (pt-stated)        CARE PLAN ENTRY (see longtitudinal plan of care for additional care plan information)  Current Barriers:  . Social, community, and financial barriers:  o Today discussed that he receives lots of calls about signing up for Medicare Supplement plans. Doesn't know who is a scam and who isn't. Currently just has Medicare A/B . Polypharmacy, complex patient w/ multiple comorbidities including COPD, CHF, CAD, tobacco abuse o [name] recall and backorder has affected patient. He is now w/o therapy. Has been quit for ~4 months . Tobacco abuse of ~40 years;  o Patient CONFIRMS he has remained tobacco free since ~11/09/19 . CAD s/p prior stenting; ASA 81 mg daily, clopidogrel 75 mg daily; atorvastatin 80 mg daily, ezetimibe 10 mg daily;  o LDL >70, was 72 on last check. Notes that he eats a lot of grilled vegetables, broccoli; tries to avoid fried food.  Occasional bacon, but cooks on the grill; uses olive oil spray for cooking instead of butter; red meat once a week; eats seafood about every 2-3 weeks; drink diet dr pepper 2-3 times daily. Sweets - has been eating more while trying to quit smoking.  Marland Kitchen HFrEF s/p AICD placement (most recent EF 25-30%); Entresto 97/103 mg BID, carvedilol 3.125 mg BID, furosemide 80 mg daily (2 40 mg tabs at once), potassium 10 mEq daily o Reports he does not check BP at home regularly and does not have a scale at home to weigh o APPROVED for Entresto assistance through 08/11/2020 . Ventricular tachycardia: amiodarone 300 mg daily; . COPD: Stiolto 2.5/2.5 mg daily 2 puffs daily; no need for rescue albuterol since stopping smoking. o APPROVED for Stiolto assistance through 07/28/20  Pharmacist Clinical Goal(s):  Marland Kitchen Over the next 90 days, patient will work with multidisciplinary team on medication access.  Interventions: . Comprehensive medication review performed, medication list updated in electronic medical record . Inter-disciplinary care team collaboration (see longitudinal plan of care) . Extensive dietary discussion regarding lipid management. Encouraged adherence to atorvastatin and ezetimibe. He verbalized understanding . Discussed benefit of occasional home BP monitoring and daily weights to monitor HF fluid status. He verbalized understanding. He notes he will get a scale and start monitoring his weight . Discussed benefit of prescription drug coverage. Discussed income. His wife does not have  income and does not qualify for Medicare. Per his SSI income, he should qualify for Medicare Extra Help. Patient unsure of some of the necessary parts of the application. He will collect them over the weekend and I will call to help apply online next week. Discussed pursuing support from [place] to help navigate signing up for a prescription drug plan. Will provide information for the local counselors in [place].    Patient Self Care Activities:  . Patient will commit to remaining tobacco-free  Please see past updates related to this goal by clicking on the "Past Updates" button in the selected goal         The patient verbalized understanding of instructions provided today and agreed to receive a mailed copy of patient instruction and/or educational materials.  Plan:  - Scheduled f/u call in ~ 3 days for Medicare Extra Help app  Catie Darnelle Maffucci, PharmD, [place], [place] Pharmacist [place] [phone]

## 2020-03-09 NOTE — Chronic Care Management (AMB) (Signed)
Chronic Care Management   Follow Up Note   03/09/2020 Name: Ian Mills MRN: [id number] DOB: 01-30-1956  Referred by: McLean-Scocuzza, Nino Glow, MD Reason for referral : Chronic Care Management (Medication Management)   Ian Mills is a 64 y.o. year old male who is a primary care patient of McLean-Scocuzza, Nino Glow, MD. The CCM team was consulted for assistance with chronic disease management and care coordination needs.    Contacted patient for medication management f/u.   Review of patient status, including review of consultants reports, relevant laboratory and other test results, and collaboration with appropriate care team members and the patient's provider was performed as part of comprehensive patient evaluation and provision of chronic care management services.    SDOH (Social Determinants of Health) assessments performed: Yes See Care Plan activities for detailed interventions related to SDOH)  SDOH Interventions     Most Recent Value  SDOH Interventions  SDOH Interventions for the Following Domains Tobacco  Financial Strain Interventions Other (Comment)  [facility] referral, Medicare Extra Help review]  Tobacco Interventions Other (Comment)  [reviewed importance of staying tobacco free]       Outpatient Encounter Medications as of 03/09/2020  Medication Sig Note  . albuterol (VENTOLIN HFA) 108 (90 Base) MCG/ACT inhaler Inhale 1 puff into the lungs every 6 (six) hours as needed for wheezing or shortness of breath.   Marland Kitchen amiodarone (PACERONE) 200 MG tablet Take 1.5 tablets (300 mg total) by mouth daily.   Marland Kitchen aspirin 81 MG EC tablet Take 1 tablet (81 mg total) by mouth daily.   Marland Kitchen atorvastatin (LIPITOR) 80 MG tablet Take 1 tablet (80 mg total) by mouth daily.   . carvedilol (COREG) 3.125 MG tablet Take 1 tablet (3.125 mg total) by mouth 2 (two) times daily.   . clopidogrel (PLAVIX) 75 MG tablet Take 1 tablet (75 mg total) by mouth daily.   Marland Kitchen ezetimibe (ZETIA) 10 MG tablet  Take 1 tablet (10 mg total) by mouth daily.   . finasteride (PROSCAR) 5 MG tablet Take 5 mg by mouth daily.   . furosemide (LASIX) 40 MG tablet Take 1 tablet by mouth twice daily 03/09/2020: Takes 80 mg QAM  . potassium chloride (KLOR-CON 10) 10 MEQ tablet Take 1 tablet (10 mEq total) by mouth daily.   . sacubitril-valsartan (ENTRESTO) 97-103 MG Take 1 tablet by mouth 2 (two) times daily.   . Tiotropium Bromide-Olodaterol (STIOLTO RESPIMAT) 2.5-2.5 MCG/ACT AERS Inhale 2 puffs into the lungs daily.   Marland Kitchen ALPRAZolam (XANAX) 0.5 MG tablet Take 1 tablet (0.5 mg total) by mouth 3 (three) times daily as needed. for anxiety   . ipratropium-albuterol (DUONEB) 0.5-2.5 (3) MG/3ML SOLN Take 3 mLs by nebulization every 6 (six) hours as needed. (Patient not taking: Reported on 03/09/2020)   . tadalafil (CIALIS) 10 MG tablet Take 1 tablet (10 mg total) by mouth daily as needed for erectile dysfunction (take 30 min prior to when needed.).   . [DISCONTINUED] varenicline ([name] STARTING MONTH PAK) 0.5 MG X 11 & 1 MG X 42 tablet Take 0.5 mg by mouth 2 (two) times daily.  (Patient not taking: Reported on 03/09/2020)    No facility-administered encounter medications on file as of 03/09/2020.     Objective:   Goals Addressed              This Visit's Progress     Patient Stated   .  "I want to quit smoking" (pt-stated)  CARE PLAN ENTRY (see longtitudinal plan of care for additional care plan information)  Current Barriers:  . Social, community, and financial barriers:  o Today discussed that he receives lots of calls about signing up for Medicare Supplement plans. Doesn't know who is a scam and who isn't. Currently just has Medicare A/B . Polypharmacy, complex patient w/ multiple comorbidities including COPD, CHF, CAD, tobacco abuse o [name] recall and backorder has affected patient. He is now w/o therapy. Has been quit for ~4 months . Tobacco abuse of ~40 years;  o Patient CONFIRMS he has remained  tobacco free since ~11/09/19 . CAD s/p prior stenting; ASA 81 mg daily, clopidogrel 75 mg daily; atorvastatin 80 mg daily, ezetimibe 10 mg daily;  o LDL >70, was 72 on last check. Notes that he eats a lot of grilled vegetables, broccoli; tries to avoid fried food. Occasional bacon, but cooks on the grill; uses olive oil spray for cooking instead of butter; red meat once a week; eats seafood about every 2-3 weeks; drink diet dr pepper 2-3 times daily. Sweets - has been eating more while trying to quit smoking.  Marland Kitchen HFrEF s/p AICD placement (most recent EF 25-30%); Entresto 97/103 mg BID, carvedilol 3.125 mg BID, furosemide 80 mg daily (2 40 mg tabs at once), potassium 10 mEq daily o Reports he does not check BP at home regularly and does not have a scale at home to weigh o APPROVED for Entresto assistance through 08/11/2020 . Ventricular tachycardia: amiodarone 300 mg daily; . COPD: Stiolto 2.5/2.5 mg daily 2 puffs daily; no need for rescue albuterol since stopping smoking. o APPROVED for Stiolto assistance through 07/28/20  Pharmacist Clinical Goal(s):  Marland Kitchen Over the next 90 days, patient will work with multidisciplinary team on medication access.  Interventions: . Comprehensive medication review performed, medication list updated in electronic medical record . Inter-disciplinary care team collaboration (see longitudinal plan of care) . Extensive dietary discussion regarding lipid management. Encouraged adherence to atorvastatin and ezetimibe. He verbalized understanding . Discussed benefit of occasional home BP monitoring and daily weights to monitor HF fluid status. He verbalized understanding. He notes he will get a scale and start monitoring his weight . Discussed benefit of prescription drug coverage. Discussed income. His wife does not have income and does not qualify for Medicare. Per his SSI income, he should qualify for Medicare Extra Help. Patient unsure of some of the necessary parts of the  application. He will collect them over the weekend and I will call to help apply online next week. Discussed pursuing support from [place] to help navigate signing up for a prescription drug plan. Will provide information for the local counselors in [place].   Patient Self Care Activities:  . Patient will commit to remaining tobacco-free  Please see past updates related to this goal by clicking on the "Past Updates" button in the selected goal          Plan:  - Scheduled f/u call in ~ 3 days for Medicare Extra Help app  Catie Darnelle Maffucci, PharmD, [place], [place] Pharmacist [place] [phone]

## 2020-03-11 ENCOUNTER — Other Ambulatory Visit: Payer: Self-pay | Admitting: Cardiovascular Disease

## 2020-03-13 ENCOUNTER — Ambulatory Visit: Payer: Medicare Other | Admitting: Pharmacist

## 2020-03-13 DIAGNOSIS — I255 Ischemic cardiomyopathy: Secondary | ICD-10-CM

## 2020-03-13 DIAGNOSIS — I25119 Atherosclerotic heart disease of native coronary artery with unspecified angina pectoris: Secondary | ICD-10-CM

## 2020-03-13 NOTE — Patient Instructions (Signed)
Visit Information  Goals Addressed              This Visit's Progress     Patient Stated   .  "I want to quit smoking" (pt-stated)        CARE PLAN ENTRY (see longtitudinal plan of care for additional care plan information)  Current Barriers:  . Social, community, and financial barriers:  o Collaborating on completion of Medication Extra Help application . Polypharmacy, complex patient w/ multiple comorbidities including COPD, CHF, CAD, tobacco abuse o [name] recall and backorder has affected patient. He is now w/o therapy. Has been quit for ~4 months . Tobacco abuse of ~40 years;  o Patient CONFIRMS he has remained tobacco free since ~11/09/19 . CAD s/p prior stenting; ASA 81 mg daily, clopidogrel 75 mg daily; atorvastatin 80 mg daily, ezetimibe 10 mg daily;  o LDL >70, was 72 on last check.  . HFrEF s/p AICD placement (most recent EF 25-30%); Entresto 97/103 mg BID, carvedilol 3.125 mg BID, furosemide 80 mg daily (2 40 mg tabs at once), potassium 10 mEq daily o Reports he does not check BP at home regularly and does not have a scale at home to weigh o APPROVED for Entresto assistance through 08/11/2020 . Ventricular tachycardia: amiodarone 300 mg daily; . COPD: Stiolto 2.5/2.5 mg daily 2 puffs daily; no need for rescue albuterol since stopping smoking. o APPROVED for Stiolto assistance through 07/28/20  Pharmacist Clinical Goal(s):  Marland Kitchen Over the next 90 days, patient will work with multidisciplinary team on medication access.  Interventions: . Assisted patient in completion of Medicare Extra Help application. His wife just started a part time job, so they may now be over income. Discussed that determination will come in the mail in the next 4-6 weeks. He can call me when he receives that information. Reviewed that even if he is denied for Medicare Extra Help, it would behoove him to outreach [facility] for support in evaluating signing up for a Medicare C or D plan.  Patient Self  Care Activities:  . Patient will commit to remaining tobacco-free  Please see past updates related to this goal by clicking on the "Past Updates" button in the selected goal         The patient verbalized understanding of instructions provided today and declined a print copy of patient instruction materials.    Plan:  - Scheduled f/u call in ~ 4-6 weeks  Catie Darnelle Maffucci, PharmD, [place], [place] Pharmacist [place] [phone]

## 2020-03-13 NOTE — Chronic Care Management (AMB) (Signed)
Chronic Care Management   Follow Up Note   03/13/2020 Name: Ian Mills MRN: [id number] DOB: [birth year]  Referred by: McLean-Scocuzza, Nino Glow, MD Reason for referral : Chronic Care Management (Medication Management)   Ian Mills is a 64 y.o. year old male who is a primary care patient of McLean-Scocuzza, Nino Glow, MD. The CCM team was consulted for assistance with chronic disease management and care coordination needs.    Contacted patient for medication access support.   Review of patient status, including review of consultants reports, relevant laboratory and other test results, and collaboration with appropriate care team members and the patient's provider was performed as part of comprehensive patient evaluation and provision of chronic care management services.    SDOH (Social Determinants of Health) assessments performed: No See Care Plan activities for detailed interventions related to [name])     Outpatient Encounter Medications as of 03/13/2020  Medication Sig Note  . albuterol (VENTOLIN HFA) 108 (90 Base) MCG/ACT inhaler Inhale 1 puff into the lungs every 6 (six) hours as needed for wheezing or shortness of breath.   . ALPRAZolam (XANAX) 0.5 MG tablet Take 1 tablet (0.5 mg total) by mouth 3 (three) times daily as needed. for anxiety   . amiodarone (PACERONE) 200 MG tablet Take 1.5 tablets (300 mg total) by mouth daily.   Marland Kitchen aspirin 81 MG EC tablet Take 1 tablet (81 mg total) by mouth daily.   Marland Kitchen atorvastatin (LIPITOR) 80 MG tablet Take 1 tablet (80 mg total) by mouth daily.   . carvedilol (COREG) 3.125 MG tablet Take 1 tablet (3.125 mg total) by mouth 2 (two) times daily.   . clopidogrel (PLAVIX) 75 MG tablet Take 1 tablet (75 mg total) by mouth daily.   Marland Kitchen ezetimibe (ZETIA) 10 MG tablet Take 1 tablet (10 mg total) by mouth daily.   . finasteride (PROSCAR) 5 MG tablet Take 5 mg by mouth daily.   . furosemide (LASIX) 40 MG tablet Take 1 tablet by mouth twice daily 03/09/2020:  Takes 80 mg QAM  . ipratropium-albuterol (DUONEB) 0.5-2.5 (3) MG/3ML SOLN Take 3 mLs by nebulization every 6 (six) hours as needed. (Patient not taking: Reported on 03/09/2020)   . potassium chloride (KLOR-CON 10) 10 MEQ tablet Take 1 tablet (10 mEq total) by mouth daily.   . sacubitril-valsartan (ENTRESTO) 97-103 MG Take 1 tablet by mouth 2 (two) times daily.   . tadalafil (CIALIS) 10 MG tablet Take 1 tablet (10 mg total) by mouth daily as needed for erectile dysfunction (take 30 min prior to when needed.).   Marland Kitchen Tiotropium Bromide-Olodaterol (STIOLTO RESPIMAT) 2.5-2.5 MCG/ACT AERS Inhale 2 puffs into the lungs daily.    No facility-administered encounter medications on file as of 03/13/2020.     Objective:   Goals Addressed              This Visit's Progress     Patient Stated   .  "I want to quit smoking" (pt-stated)        CARE PLAN ENTRY (see longtitudinal plan of care for additional care plan information)  Current Barriers:  . Social, community, and financial barriers:  o Collaborating on completion of Medication Extra Help application . Polypharmacy, complex patient w/ multiple comorbidities including COPD, CHF, CAD, tobacco abuse o [name] recall and backorder has affected patient. He is now w/o therapy. Has been quit for ~4 months . Tobacco abuse of ~40 years;  o Patient CONFIRMS he has remained tobacco free since ~11/09/19 .  CAD s/p prior stenting; ASA 81 mg daily, clopidogrel 75 mg daily; atorvastatin 80 mg daily, ezetimibe 10 mg daily;  o LDL >70, was 72 on last check.  . HFrEF s/p AICD placement (most recent EF 25-30%); Entresto 97/103 mg BID, carvedilol 3.125 mg BID, furosemide 80 mg daily (2 40 mg tabs at once), potassium 10 mEq daily o Reports he does not check BP at home regularly and does not have a scale at home to weigh o APPROVED for Entresto assistance through 08/11/2020 . Ventricular tachycardia: amiodarone 300 mg daily; . COPD: Stiolto 2.5/2.5 mg daily 2 puffs  daily; no need for rescue albuterol since stopping smoking. o APPROVED for Stiolto assistance through 07/28/20  Pharmacist Clinical Goal(s):  Marland Kitchen Over the next 90 days, patient will work with multidisciplinary team on medication access.  Interventions: . Assisted patient in completion of Medicare Extra Help application. His wife just started a part time job, so they may now be over income. Discussed that determination will come in the mail in the next 4-6 weeks. He can call me when he receives that information. Reviewed that even if he is denied for Medicare Extra Help, it would behoove him to outreach [facility] for support in evaluating signing up for a Medicare C or D plan.  Patient Self Care Activities:  . Patient will commit to remaining tobacco-free  Please see past updates related to this goal by clicking on the "Past Updates" button in the selected goal          Plan:  - Scheduled f/u call in ~ 4-6 weeks  Catie Darnelle Maffucci, PharmD, [place], [place] Pharmacist [place] [phone]

## 2020-03-14 ENCOUNTER — Ambulatory Visit (INDEPENDENT_AMBULATORY_CARE_PROVIDER_SITE_OTHER): Payer: Medicare Other | Admitting: *Deleted

## 2020-03-14 DIAGNOSIS — I472 Ventricular tachycardia, unspecified: Secondary | ICD-10-CM

## 2020-03-16 LAB — CUP PACEART REMOTE DEVICE CHECK
Battery Remaining Longevity: 60 mo
Battery Remaining Percentage: 64 %
Brady Statistic RV Percent Paced: 0 %
Date Time Interrogation Session: 20210817024600
HighPow Impedance: 89 Ohm
Implantable Lead Implant Date: 20130503
Implantable Lead Location: 753860
Implantable Lead Model: 292
Implantable Lead Serial Number: 112568
Implantable Pulse Generator Implant Date: 20130503
Lead Channel Impedance Value: 603 Ohm
Lead Channel Pacing Threshold Amplitude: 0.8 V
Lead Channel Pacing Threshold Pulse Width: 0.5 ms
Lead Channel Setting Pacing Amplitude: 2.4 V
Lead Channel Setting Pacing Pulse Width: 0.5 ms
Lead Channel Setting Sensing Sensitivity: 0.6 mV
Pulse Gen Serial Number: 105232

## 2020-03-20 ENCOUNTER — Ambulatory Visit: Payer: Medicare Other | Admitting: Pulmonary Disease

## 2020-03-20 NOTE — Progress Notes (Signed)
Remote ICD transmission.   

## 2020-03-27 ENCOUNTER — Encounter: Payer: Self-pay | Admitting: Pulmonary Disease

## 2020-03-27 ENCOUNTER — Other Ambulatory Visit: Payer: Self-pay

## 2020-03-27 ENCOUNTER — Ambulatory Visit (INDEPENDENT_AMBULATORY_CARE_PROVIDER_SITE_OTHER): Payer: Medicare Other | Admitting: Pulmonary Disease

## 2020-03-27 VITALS — BP 118/60 | HR 60 | Temp 96.9°F | Ht 67.99 in | Wt 208.6 lb

## 2020-03-27 DIAGNOSIS — Z0000 Encounter for general adult medical examination without abnormal findings: Secondary | ICD-10-CM | POA: Insufficient documentation

## 2020-03-27 DIAGNOSIS — R0600 Dyspnea, unspecified: Secondary | ICD-10-CM

## 2020-03-27 DIAGNOSIS — R0609 Other forms of dyspnea: Secondary | ICD-10-CM

## 2020-03-27 DIAGNOSIS — Z87891 Personal history of nicotine dependence: Secondary | ICD-10-CM

## 2020-03-27 DIAGNOSIS — J42 Unspecified chronic bronchitis: Secondary | ICD-10-CM | POA: Diagnosis not present

## 2020-03-27 NOTE — Assessment & Plan Note (Signed)
Plan: Obtain seasonal flu vaccine when available in fall/2021

## 2020-03-27 NOTE — Assessment & Plan Note (Signed)
Plan: Continue not smoke Repeat lung cancer screening CT in July/2022

## 2020-03-27 NOTE — Assessment & Plan Note (Signed)
mMRC 0 Maintain well on Stiolto Respimat Lung RADS 2 with mild centrilobular emphysema on lung cancer screening CT in July/2021  Plan: We will order repeat pulmonary function testing as last PFTs were in 2013, I do not have these records Continue Stiolto Respimat Repeat lung cancer screening CT in July/2022

## 2020-03-27 NOTE — Patient Instructions (Addendum)
You were seen today by Lauraine Rinne, NP  for:   1. Chronic bronchitis, unspecified chronic bronchitis type (HCC)  Stiolto Respimat inhaler >>>2 puffs daily >>>Take this no matter what >>>This is not a rescue inhaler  Note your daily symptoms > remember "red flags" for COPD:   >>>Increase in cough >>>increase in sputum production >>>increase in shortness of breath or activity  intolerance.   If you notice these symptoms, please call the office to be seen.   We will order a pulmonary function test to further evaluate your breathing.  This is similar to the test that you did in 2013 at [facility]  2. Former smoker  Medical sales representative on stopping smoking!  We are proud of you!  Remain in the lung cancer screening program, you are due for repeat lung cancer screening CT in July/2022  3. Healthcare maintenance  Obtain seasonal flu vaccine in fall/2021  Follow Up:    Return in about 6 months (around 09/25/2020), or if symptoms worsen or fail to improve, for [facility] - Dr. Patsey Berthold, Follow up for FULL PFT - 60 min.   Please do your part to reduce the spread of COVID-19:      Reduce your risk of any infection  and COVID19 by using the similar precautions used for avoiding the common cold or flu:  Marland Kitchen Wash your hands often with soap and warm water for at least 20 seconds.  If soap and water are not readily available, use an alcohol-based hand sanitizer with at least 60% alcohol.  . If coughing or sneezing, cover your mouth and nose by coughing or sneezing into the elbow areas of your shirt or coat, into a tissue or into your sleeve (not your hands). Langley Gauss A MASK when in public  . Avoid shaking hands with others and consider head nods or verbal greetings only. . Avoid touching your eyes, nose, or mouth with unwashed hands.  . Avoid close contact with people who are sick. . Avoid places or events with large numbers of people in one location, like concerts or sporting events. . If you  have some symptoms but not all symptoms, continue to monitor at home and seek medical attention if your symptoms worsen. . If you are having a medical emergency, call 911.   [place] / e-Visit: [email or web address]         [facility]: [facility]: [phone]                   [facility]: [phone]     It is flu season:   >>> Best ways to protect herself from the flu: Receive the yearly flu vaccine, practice good hand hygiene washing with soap and also using hand sanitizer when available, eat a nutritious meals, get adequate rest, hydrate appropriately   Please contact the office if your symptoms worsen or you have concerns that you are not improving.   Thank you for choosing [facility] for your healthcare, and for allowing [place] to partner with you on your healthcare journey. I am thankful to be able to provide care to you today.   Wyn Quaker FNP-C

## 2020-03-27 NOTE — Progress Notes (Signed)
@Patient  ID: Ian Mills, male    DOB: 1955/08/30, 64 y.o.   MRN: [id number]  Chief Complaint  Patient presents with  . Follow-up    Breathing is overall doing well. He rarely uses his albuterol inhaler and neb.     Referring provider: McLean-Scocuzza, Ian Mills *  HPI:  64 year old male former smoker followed in our office for COPD  PMH: Smoker hyperlipidemia, hypertension, insomnia, carotid artery stenosis, congestive systolic heart failure Smoker/ Smoking History: Former smoker Maintenance: Stiolto Respimat Pt of: Dr. Patsey Mills  03/27/2020  - Visit   64 year old male followed in our office for COPD.  Patient last completed follow-up with our office in March/2021.  That was a virtual visit.  At that time patient was recommended to follow-up in 4 months with.  Patient was encouraged to work on decreasing smoking.  Patient was encouraged to obtain COVID-19 vaccines.  And remain on Stiolto Respimat.  Patient presenting to office today as a follow-up.  He remains a former smoker.  He is doing well.  He has not had any recent antibiotics or prednisone.  He remains adherent to [employer] Respimat.  He is unsure when his last pulmonary function test was.  It appears that it may have been in 2013 at [facility].  We do not have these records on hand.  He is completed a lung cancer screening CT in July/2021.  It was read as a lung RADS 2.  Questionaires / Pulmonary Flowsheets:   ACT:  No flowsheet data found.  MMRC: mMRC Dyspnea Scale mMRC Score  03/27/2020 0    Epworth:  No flowsheet data found.  Tests:   03/03/2020-eosinophils relative 1.6, eosinophils absolute 99  02/16/2020-CT chest lung cancer screening-lung RADS 2, continue annual screening in 12 months, mild centrilobular emphysema, aortic arthrosclerosis and coronary artery calcification   FENO:  No results found for: NITRICOXIDE  PFT: No flowsheet data found.  WALK:  SIX MIN WALK 08/12/2019  Supplimental Oxygen during Test?  (L/min) No  Tech Comments: Pt walked at a moderate paced and chatted throughout the walk with no difficulties. Stated he felt fine at the end of the walk. TG    Imaging: CUP PACEART REMOTE DEVICE CHECK  Result Date: 03/16/2020 Scheduled remote reviewed. Normal device function.  (no atrial lead) Next remote 91 days. Ian Breach, RN, CCDS, [employer]   Lab Results:  CBC    Component Value Date/Time   WBC 6.2 03/03/2020 1417   RBC 5.09 03/03/2020 1417   HGB 15.3 03/03/2020 1417   HGB CANCELED 08/18/2019 1139   HCT 45.8 03/03/2020 1417   HCT CANCELED 08/18/2019 1139   PLT 172 03/03/2020 1417   PLT CANCELED 08/18/2019 1139   MCV 90.0 03/03/2020 1417   MCV 94 05/30/2014 0455   MCH 30.1 03/03/2020 1417   MCHC 33.4 03/03/2020 1417   RDW 13.2 03/03/2020 1417   RDW 14.4 05/30/2014 0455   LYMPHSABS 2,666 03/03/2020 1417   LYMPHSABS 1.9 05/30/2014 0455   MONOABS 0.7 06/10/2019 0955   MONOABS 0.8 05/30/2014 0455   EOSABS 99 03/03/2020 1417   EOSABS 0.1 05/30/2014 0455   BASOSABS 31 03/03/2020 1417   BASOSABS 0.1 05/30/2014 0455    BMET    Component Value Date/Time   NA 138 03/03/2020 1417   NA 139 08/18/2019 1139   NA 137 05/30/2014 0455   K 4.6 03/03/2020 1417   K 3.9 05/30/2014 0455   CL 103 03/03/2020 1417   CL 106 05/30/2014  0455   CO2 22 03/03/2020 1417   CO2 26 05/30/2014 0455   GLUCOSE 77 03/03/2020 1417   GLUCOSE 104 (H) 05/30/2014 0455   BUN 25 03/03/2020 1417   BUN 37 (H) 08/18/2019 1139   BUN 12 05/30/2014 0455   CREATININE 1.46 (H) 03/03/2020 1417   CALCIUM 9.0 03/03/2020 1417   CALCIUM 8.1 (L) 05/30/2014 0455   GFRNONAA 47 (L) 08/23/2019 1247   GFRNONAA >60 05/29/2014 1136   GFRNONAA >60 06/30/2013 0637   GFRAA 54 (L) 08/23/2019 1247   GFRAA >60 05/29/2014 1136   GFRAA >60 06/30/2013 0637    BNP No results found for: BNP  ProBNP No results found for: PROBNP  Specialty Problems      Pulmonary Problems   COPD exacerbation (HCC)    Chronic obstructive pulmonary disease (HCC)      Allergies  Allergen Reactions  . Nitroglycerin Nausea Only and Other (See Comments)    Per patient "causes severe  headache"    Immunization History  Administered Date(s) Administered  . Fluad Quad(high Dose 65+) 06/10/2019  . Influenza,inj,Quad PF,6+ Mos 06/26/2016  . PFIZER SARS-COV-2 Vaccination 12/22/2019, 01/12/2020  . Pneumococcal Polysaccharide-23 09/09/2018  . Tdap 03/03/2020    Past Medical History:  Diagnosis Date  . Coronary artery disease    a. 3 stents to the LAD, 1 to the PDA, and 1 to the OM3; b. cath 06/2013: chronically occluded LAD, patent stent LAD, D1 20%, pLCx 30%, mLCx 20%, pRCA 70% (FFR 0.74) s/p PCI/DES, mRCA 70% s/p PCI/DES, RPDA 50%, EF 20% w/ AK of anterolateral & apical walls, moderately elevated LVEDP  . HFrEF (heart failure with reduced ejection fraction) ([facility])    a. 05/2014 Echo: EF 25-30%; b. 01/2018 Echo ([facility]): EF 30%.  Marland Kitchen HLD (hyperlipidemia)   . HTN (hypertension)   . ICD  single,BSX    a. DOI 11/2011; b. S/N# [id number]  . Ischemic cardiomyopathy    a. 05/2014 Echo: EF 25-30%; b. 01/2018 Echo ([facility]): EF 30%, sev glob HK w/ apical/ant AK. Mild LVH. Mod LAE, mild RAE. Triv MR/TR.  Marland Kitchen Pneumonia    08/2018  . Syncope and collapse   . Ventricular tachycardia ([place]) 11/2011   a. s/p AICD implant     Tobacco History: Social History   Tobacco Use  Smoking Status Former Smoker  . Packs/day: 0.50  . Years: 42.00  . Pack years: 21.00  . Types: Cigarettes  . Quit date: 11/10/2019  . Years since quitting: 0.3  Smokeless Tobacco Never Used   Counseling given: Not Answered   Continue to not smoke  Outpatient Encounter Medications as of 03/27/2020  Medication Sig  . albuterol (VENTOLIN HFA) 108 (90 Base) MCG/ACT inhaler Inhale 1 puff into the lungs every 6 (six) hours as needed for wheezing or shortness of breath.  . ALPRAZolam (XANAX) 0.5 MG tablet Take 1 tablet (0.5 mg total) by mouth 3 (three) times  daily as needed. for anxiety  . amiodarone (PACERONE) 200 MG tablet Take 1.5 tablets (300 mg total) by mouth daily.  Marland Kitchen aspirin 81 MG EC tablet Take 1 tablet (81 mg total) by mouth daily.  Marland Kitchen atorvastatin (LIPITOR) 80 MG tablet Take 1 tablet (80 mg total) by mouth daily.  . carvedilol (COREG) 3.125 MG tablet Take 1 tablet (3.125 mg total) by mouth 2 (two) times daily.  . clopidogrel (PLAVIX) 75 MG tablet Take 1 tablet (75 mg total) by mouth daily.  Marland Kitchen ezetimibe (ZETIA) 10 MG tablet Take 1 tablet (  10 mg total) by mouth daily.  . finasteride (PROSCAR) 5 MG tablet Take 5 mg by mouth daily.  . furosemide (LASIX) 40 MG tablet Take 1 tablet by mouth twice daily  . ipratropium-albuterol (DUONEB) 0.5-2.5 (3) MG/3ML SOLN Take 3 mLs by nebulization every 6 (six) hours as needed.  . potassium chloride (KLOR-CON 10) 10 MEQ tablet Take 1 tablet (10 mEq total) by mouth daily.  . sacubitril-valsartan (ENTRESTO) 97-103 MG Take 1 tablet by mouth 2 (two) times daily.  . Tiotropium Bromide-Olodaterol (STIOLTO RESPIMAT) 2.5-2.5 MCG/ACT AERS Inhale 2 puffs into the lungs daily.  . tadalafil (CIALIS) 10 MG tablet Take 1 tablet (10 mg total) by mouth daily as needed for erectile dysfunction (take 30 min prior to when needed.).   No facility-administered encounter medications on file as of 03/27/2020.     Review of Systems  Review of Systems  Constitutional: Negative for activity change, chills, fatigue, fever and unexpected weight change.  HENT: Negative for postnasal drip, rhinorrhea, sinus pressure, sinus pain and sore throat.   Eyes: Negative.   Respiratory: Negative for cough, shortness of breath and wheezing.   Cardiovascular: Negative for chest pain and palpitations.  Gastrointestinal: Negative for constipation, diarrhea, nausea and vomiting.  Endocrine: Negative.   Genitourinary: Negative.   Musculoskeletal: Negative.   Skin: Negative.   Neurological: Negative for dizziness and headaches.    Psychiatric/Behavioral: Negative.  Negative for dysphoric mood. The patient is not nervous/anxious.   All other systems reviewed and are negative.    Physical Exam  BP 118/60 (BP Location: Left Arm, Cuff Size: Normal)   Pulse 60   Temp (!) 96.9 F (36.1 C) (Temporal)   Ht 5' 7.99" (1.727 m)   Wt 208 lb 9.6 oz (94.6 kg)   SpO2 98% Comment: on RA  BMI 31.73 kg/m   Wt Readings from Last 5 Encounters:  03/27/20 208 lb 9.6 oz (94.6 kg)  03/03/20 210 lb 3.2 oz (95.3 kg)  02/16/20 200 lb (90.7 kg)  01/25/20 205 lb 2 oz (93 kg)  12/28/19 198 lb (89.8 kg)    BMI Readings from Last 5 Encounters:  03/27/20 31.73 kg/m  03/03/20 31.97 kg/m  02/16/20 30.41 kg/m  01/25/20 31.19 kg/m  12/28/19 30.11 kg/m     Physical Exam Vitals and nursing note reviewed.  Constitutional:      General: He is not in acute distress.    Appearance: Normal appearance. He is obese.  HENT:     Head: Normocephalic and atraumatic.     Right Ear: Hearing and external ear normal.     Left Ear: Hearing and external ear normal.     Nose: Nose normal. No mucosal edema or rhinorrhea.     Right Turbinates: Not enlarged.     Left Turbinates: Not enlarged.     Mouth/Throat:     Mouth: Mucous membranes are dry.     Pharynx: Oropharynx is clear. No oropharyngeal exudate.  Eyes:     Pupils: Pupils are equal, round, and reactive to light.  Cardiovascular:     Rate and Rhythm: Normal rate and regular rhythm.     Pulses: Normal pulses.     Heart sounds: Normal heart sounds. No murmur heard.   Pulmonary:     Effort: Pulmonary effort is normal.     Breath sounds: Normal breath sounds. No decreased breath sounds, wheezing or rales.  Musculoskeletal:     Cervical back: Normal range of motion.     Right lower leg:  No edema.     Left lower leg: No edema.  Lymphadenopathy:     Cervical: No cervical adenopathy.  Skin:    General: Skin is warm and dry.     Capillary Refill: Capillary refill takes less than 2  seconds.     Findings: Rash present. No erythema.       Neurological:     General: No focal deficit present.     Mental Status: He is alert and oriented to person, place, and time.     Motor: No weakness.     Coordination: Coordination normal.     Gait: Gait is intact. Gait normal.  Psychiatric:        Mood and Affect: Mood normal.        Behavior: Behavior normal. Behavior is cooperative.        Thought Content: Thought content normal.        Judgment: Judgment normal.       Assessment & Plan:   Chronic obstructive pulmonary disease (HCC) mMRC 0 Maintain well on Stiolto Respimat Lung RADS 2 with mild centrilobular emphysema on lung cancer screening CT in July/2021  Plan: We will order repeat pulmonary function testing as last PFTs were in 2013, I do not have these records Continue Stiolto Respimat Repeat lung cancer screening CT in [place] maintenance Plan: Obtain seasonal flu vaccine when available in fall/2021  Former smoker Plan: Continue not smoke Repeat lung cancer screening CT in July/2022    Return in about 6 months (around 09/25/2020), or if symptoms worsen or fail to improve, for [facility] - Dr. Patsey Mills, Follow up for FULL PFT - 60 min.   Lauraine Rinne, NP 03/27/2020   This appointment required 32 minutes of patient care (this includes precharting, chart review, review of results, face-to-face care, etc.).

## 2020-04-04 ENCOUNTER — Telehealth: Payer: Self-pay | Admitting: Pulmonary Disease

## 2020-04-04 ENCOUNTER — Ambulatory Visit: Payer: Medicare Other

## 2020-04-04 DIAGNOSIS — J449 Chronic obstructive pulmonary disease, unspecified: Secondary | ICD-10-CM

## 2020-04-04 NOTE — Progress Notes (Signed)
error 

## 2020-04-04 NOTE — Telephone Encounter (Signed)
Patient is aware of below message and voiced his understanding.  Patient stated that he has a pulse ox and he will continue to monitor his spo2.  Order has been placed to adapt to d/c oxygen. Nothing further is needed at this time.

## 2020-04-04 NOTE — Telephone Encounter (Signed)
04/04/2020  Of oxygen levels are stable and patient does not want have oxygen at home then that is perfectly okay.  Vital signs from 03/27/2020 office visit shows oxygen saturations 98% on room air.  Okay to send order.  Patient's oxygen saturations dropped below 88% on room air and he will need to contact our office or seek emergent evaluation.  Wyn Quaker, FNP

## 2020-04-04 NOTE — Telephone Encounter (Signed)
Called and spoke to patient, who is requesting that an order be placed to adapt to d/c oxygen. Patient stated that he discussed this with Aaron Edelman at last OV. Patient stated that he was started on oxygen at discharged 2 years ago. He has not worn oxygen in 18 months.   Aaron Edelman, please advise. Thanks

## 2020-04-10 ENCOUNTER — Telehealth: Payer: Self-pay | Admitting: *Deleted

## 2020-04-10 ENCOUNTER — Other Ambulatory Visit: Payer: Self-pay | Admitting: *Deleted

## 2020-04-10 DIAGNOSIS — R931 Abnormal findings on diagnostic imaging of heart and coronary circulation: Secondary | ICD-10-CM

## 2020-04-10 DIAGNOSIS — Z7901 Long term (current) use of anticoagulants: Secondary | ICD-10-CM

## 2020-04-10 DIAGNOSIS — Z1211 Encounter for screening for malignant neoplasm of colon: Secondary | ICD-10-CM

## 2020-04-10 NOTE — Telephone Encounter (Signed)
-----   Message from Horris Latino, [place] sent at 03/13/2020  9:26 AM EDT ----- Check for November date.  ----- Message ----- From: Horris Latino, CMA Sent: 03/13/2020 To: Horris Latino, CMA  Try again for October mid morning appointment.  ----- Message ----- From: Horris Latino, CMA Sent: 02/14/2020 To: Horris Latino, CMA  Did we schedule colon?  Plavix 5 days Aundra Dubin.   Diabetes?   Screening CRC, low EF, chronic anticoagulation.

## 2020-04-10 NOTE — Progress Notes (Signed)
Scheduled colonoscopy for 05/30/20 @ 11:30 am.

## 2020-04-10 NOTE — Telephone Encounter (Signed)
Dr. Havery Moros November hospital date is 05/30/20. Patient agreed to that date.

## 2020-04-10 NOTE — Telephone Encounter (Signed)
Error

## 2020-04-13 ENCOUNTER — Ambulatory Visit (INDEPENDENT_AMBULATORY_CARE_PROVIDER_SITE_OTHER): Payer: Medicare Other

## 2020-04-13 VITALS — Ht 67.99 in | Wt 208.0 lb

## 2020-04-13 DIAGNOSIS — Z0000 Encounter for general adult medical examination without abnormal findings: Secondary | ICD-10-CM

## 2020-04-13 NOTE — Progress Notes (Signed)
Subjective:   Ian Mills is a 64 y.o. male who presents for Medicare Annual/Subsequent preventive examination.  Review of Systems    No ROS.  Medicare Wellness Virtual Visit.   Cardiac Risk Factors include: advanced age (>44men, >57 women);hypertension     Objective:    Today's Vitals   04/13/20 1238  Weight: 208 lb (94.3 kg)  Height: 5' 7.99" (1.727 m)   Body mass index is 31.64 kg/m.  Advanced Directives 04/13/2020 08/30/2019 08/23/2019 04/13/2019 09/18/2018 09/08/2018 09/08/2018  Does Patient Have a Medical Advance Directive? Yes Yes Yes Yes Yes Yes Yes  Type of Paramedic of [place];Living will Living will;Healthcare Power of [place];Living will [place];Living will [place];Living will Living will;Healthcare Power of Attorney  Does patient want to make changes to medical advance directive? No - Patient declined - - - No - Patient declined No - Patient declined -  Copy of [place] in Chart? Yes - validated most recent copy scanned in chart (See row information) - - Yes - validated most recent copy scanned in chart (See row information) Yes - validated most recent copy scanned in chart (See row information) No - copy requested -    Current Medications (verified) Outpatient Encounter Medications as of 04/13/2020  Medication Sig  . albuterol (VENTOLIN HFA) 108 (90 Base) MCG/ACT inhaler Inhale 1 puff into the lungs every 6 (six) hours as needed for wheezing or shortness of breath.  . ALPRAZolam (XANAX) 0.5 MG tablet Take 1 tablet (0.5 mg total) by mouth 3 (three) times daily as needed. for anxiety  . amiodarone (PACERONE) 200 MG tablet Take 1.5 tablets (300 mg total) by mouth daily.  Marland Kitchen aspirin 81 MG EC tablet Take 1 tablet (81 mg total) by mouth daily.  Marland Kitchen atorvastatin (LIPITOR) 80 MG tablet Take 1 tablet (80 mg total) by mouth daily.  .  carvedilol (COREG) 3.125 MG tablet Take 1 tablet (3.125 mg total) by mouth 2 (two) times daily.  . clopidogrel (PLAVIX) 75 MG tablet Take 1 tablet (75 mg total) by mouth daily.  Marland Kitchen ezetimibe (ZETIA) 10 MG tablet Take 1 tablet (10 mg total) by mouth daily.  . finasteride (PROSCAR) 5 MG tablet Take 5 mg by mouth daily.  . furosemide (LASIX) 40 MG tablet Take 1 tablet by mouth twice daily  . ipratropium-albuterol (DUONEB) 0.5-2.5 (3) MG/3ML SOLN Take 3 mLs by nebulization every 6 (six) hours as needed.  . potassium chloride (KLOR-CON 10) 10 MEQ tablet Take 1 tablet (10 mEq total) by mouth daily.  . sacubitril-valsartan (ENTRESTO) 97-103 MG Take 1 tablet by mouth 2 (two) times daily.  . tadalafil (CIALIS) 10 MG tablet Take 1 tablet (10 mg total) by mouth daily as needed for erectile dysfunction (take 30 min prior to when needed.).  Marland Kitchen Tiotropium Bromide-Olodaterol (STIOLTO RESPIMAT) 2.5-2.5 MCG/ACT AERS Inhale 2 puffs into the lungs daily.   No facility-administered encounter medications on file as of 04/13/2020.    Allergies (verified) Nitroglycerin   History: Past Medical History:  Diagnosis Date  . Coronary artery disease    a. 3 stents to the LAD, 1 to the PDA, and 1 to the OM3; b. cath 06/2013: chronically occluded LAD, patent stent LAD, D1 20%, pLCx 30%, mLCx 20%, pRCA 70% (FFR 0.74) s/p PCI/DES, mRCA 70% s/p PCI/DES, RPDA 50%, EF 20% w/ AK of anterolateral & apical walls, moderately elevated LVEDP  .  HFrEF (heart failure with reduced ejection fraction) ([place])    a. 05/2014 Echo: EF 25-30%; b. 01/2018 Echo ([facility]): EF 30%.  Marland Kitchen HLD (hyperlipidemia)   . HTN (hypertension)   . ICD  single,BSX    a. DOI 11/2011; b. S/N# [id number]  . Ischemic cardiomyopathy    a. 05/2014 Echo: EF 25-30%; b. 01/2018 Echo ([facility]): EF 30%, sev glob HK w/ apical/ant AK. Mild LVH. Mod LAE, mild RAE. Triv MR/TR.  Marland Kitchen Pneumonia    08/2018  . Syncope and collapse   . Ventricular tachycardia ([place]) 11/2011   a. s/p AICD implant     Past Surgical History:  Procedure Laterality Date  . appendectomy    . CARDIAC CATHETERIZATION  Nov 30 2011   [facility]  . CARDIAC DEFIBRILLATOR PLACEMENT  May 2013  . CORONARY ANGIOPLASTY  2014   x 2 stents  . CORONARY STENT PLACEMENT     x7  . LEFT HEART CATH AND CORONARY ANGIOGRAPHY Left 08/30/2019   Procedure: LEFT HEART CATH AND CORONARY ANGIOGRAPHY;  Surgeon: Wellington Hampshire, MD;  Location: [facility];  Service: Cardiovascular;  Laterality: Left;  . PROSTATE SURGERY     urethra grew around prostate   . TUMOR REMOVAL  2001   chest thymic cyst; behind lungs [facility] benign    Family History  Problem Relation Age of Onset  . Cancer Father        larynx  . Cancer - Lung Mother   . Hypertension Mother   . Heart attack Mother   . Dementia Maternal Grandmother   . Heart attack Paternal Grandfather   . Colon cancer Neg Hx   . Stomach cancer Neg Hx   . Pancreatic cancer Neg Hx    Social History   Socioeconomic History  . Marital status: Married    Spouse name: Hilda Blades   . Number of children: 2  . Years of education: Not on file  . Highest education level: Not on file  Occupational History  . Occupation: retired   Tobacco Use  . Smoking status: Former Smoker    Packs/day: 0.50    Years: 42.00    Pack years: 21.00    Types: Cigarettes    Quit date: 11/10/2019    Years since quitting: 0.4  . Smokeless tobacco: Never Used  Vaping Use  . Vaping Use: Never used  Substance and Sexual Activity  . Alcohol use: Yes    Alcohol/week: 1.0 standard drink    Types: 1 Cans of beer per week    Comment: per week  . Drug use: No  . Sexual activity: Not on file  Other Topics Concern  . 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    Social Determinants of Health   Financial Resource  Strain: Medium Risk  . Difficulty of Paying Living Expenses: Somewhat hard  Food Insecurity:   . Worried About Charity fundraiser in the Last Year: Not on file  . Ran Out of Food in the Last Year: Not on file  Transportation Needs:   . Lack of Transportation (Medical): Not on file  . Lack of Transportation (Non-Medical): Not on file  Physical Activity:   . Days of Exercise  per Week: Not on file  . Minutes of Exercise per Session: Not on file  Stress:   . Feeling of Stress : Not on file  Social Connections:   . Frequency of Communication with Friends and Family: Not on file  . Frequency of Social Gatherings with Friends and Family: Not on file  . Attends Religious Services: Not on file  . Active Member of Clubs or Organizations: Not on file  . Attends Archivist Meetings: Not on file  . Marital Status: Not on file    Tobacco Counseling Counseling given: Not Answered   Clinical Intake:  Pre-visit preparation completed: Yes        Diabetes: No  How often do you need to have someone help you when you read instructions, pamphlets, or other written materials from your doctor or pharmacy?: 1 - Never  Interpreter Needed?: No      Activities of Daily Living In your present state of health, do you have any difficulty performing the following activities: 04/13/2020 08/30/2019  Hearing? N N  Vision? N N  Difficulty concentrating or making decisions? N N  Walking or climbing stairs? N N  Dressing or bathing? N N  Doing errands, shopping? N -  Preparing Food and eating ? N -  Using the Toilet? N -  In the past six months, have you accidently leaked urine? N -  Do you have problems with loss of bowel control? N -  Managing your Medications? N -  Managing your Finances? N -  Housekeeping or managing your Housekeeping? N -  Some recent data might be hidden    Patient Care Team: McLean-Scocuzza, Nino Glow, MD as PCP - General (Internal Medicine) Minna Merritts,  MD as PCP - Cardiology (Cardiology) Deboraha Sprang, MD as PCP - Electrophysiology (Cardiology) Minna Merritts, MD as Consulting Physician (Cardiology) De Hollingshead, [facility] as Pharmacist (Pharmacist)  Indicate any recent Medical Services you may have received from other than [facility] providers in the past year (date may be approximate).     Assessment:   This is a routine wellness examination for Praveen.  I connected with Neely today by telephone and verified that I am speaking with the correct person using two identifiers. Location patient: home Location provider: work Persons participating in the virtual visit: patient, Marine scientist.    I discussed the limitations, risks, security and privacy concerns of performing an evaluation and management service by telephone and the availability of in person appointments. The patient expressed understanding and verbally consented to this telephonic visit.    Interactive audio and video telecommunications were attempted between this provider and patient, however failed, due to patient having technical difficulties OR patient did not have access to video capability.  We continued and completed visit with audio only.  Some vital signs may be absent or patient reported.   Hearing/Vision screen  Hearing Screening   125Hz  250Hz  500Hz  1000Hz  2000Hz  3000Hz  4000Hz  6000Hz  8000Hz   Right ear:           Left ear:           Comments: Patient is able to hear conversational tones without difficulty.  No issues reported.  Vision Screening Comments: Wears corrective lenses Visual acuity not assessed, virtual visit.  They have seen their ophthalmologist in the last 12 months.     Dietary issues and exercise activities discussed: Current Exercise Habits: Home exercise routine, Intensity: Mild  Cholesterol diet Good water intake  Goals: Stay active,  healthy diet.   Depression Screen PHQ 2/9 Scores 04/13/2020 03/03/2020 10/12/2019 04/15/2019 04/13/2019 01/04/2019  12/14/2018  PHQ - 2 Score 0 0 0 0 0 0 0  PHQ- 9 Score - - - 0 - 0 0    Fall Risk Fall Risk  04/13/2020 03/03/2020 12/28/2019 10/12/2019 04/15/2019  Falls in the past year? 0 0 0 0 0  Number falls in past yr: 0 0 0 0 0  Injury with Fall? - 0 0 0 0  Follow up Falls evaluation completed Falls evaluation completed Falls evaluation completed Falls evaluation completed Falls evaluation completed   Handrails in use when climbing stairs? Yes Home free of loose throw rugs in walkways, pet beds, electrical cords, etc? Yes  Adequate lighting in your home to reduce risk of falls? Yes   ASSISTIVE DEVICES UTILIZED TO PREVENT FALLS:  Life alert? No  Use of a cane, walker or w/c? No   TIMED UP AND GO: Was the test performed? No .    Cognitive Function: Patient is alert and oriented x3.  Denies difficulty making decisions, memory loss, focusing.      6CIT Screen 04/13/2020 04/13/2019  What Year? 0 points 0 points  What month? 0 points 0 points  What time? 0 points 0 points  Count back from 20 - 0 points  Months in reverse - 0 points    Immunizations Immunization History  Administered Date(s) Administered  . Fluad Quad(high Dose 65+) 06/10/2019  . Influenza,inj,Quad PF,6+ Mos 06/26/2016  . PFIZER SARS-COV-2 Vaccination 12/22/2019, 01/12/2020  . Pneumococcal Polysaccharide-23 09/09/2018  . Tdap 03/03/2020   Health Maintenance Health Maintenance  Topic Date Due  . INFLUENZA VACCINE  10/26/2020 (Originally 02/27/2020)  . PNA vac Low Risk Adult (2 of 2 - PCV13) 04/13/2021 (Originally 09/10/2019)  . COLONOSCOPY  08/18/2020  . URINE MICROALBUMIN  03/03/2021  . TETANUS/TDAP  03/03/2030  . COVID-19 Vaccine  Completed  . Hepatitis C Screening  Completed   Prevnar 13- deferred.   Dental Screening: Recommended annual dental exams for proper oral hygiene  Community Resource Referral / Chronic Care Management: CRR required this visit?  No   CCM required this visit?  No      Plan:   Keep all  routine maintenance appointments.   Follow up 04/19/20 CCM  Follow up 06/09/20 @ 2:00  I have personally reviewed and noted the following in the patient's chart:   . Medical and social history . Use of alcohol, tobacco or illicit drugs  . Current medications and supplements . Functional ability and status . Nutritional status . Physical activity . Advanced directives . List of other physicians . Hospitalizations, surgeries, and ER visits in previous 12 months . Vitals . Screenings to include cognitive, depression, and falls . Referrals and appointments  In addition, I have reviewed and discussed with patient certain preventive protocols, quality metrics, and best practice recommendations. A written personalized care plan for preventive services as well as general preventive health recommendations were provided to patient via mail.     Varney Biles, LPN   11/14/6220

## 2020-04-13 NOTE — Patient Instructions (Addendum)
Ian Mills , Thank you for taking time to come for your Medicare Wellness Visit. I appreciate your ongoing commitment to your health goals. Please review the following plan we discussed and let me know if I can assist you in the future.   These are the goals we discussed:  Goals: Stay active, healthy diet.    This is a list of the screening recommended for you and due dates:  Health Maintenance  Topic Date Due  . Flu Shot  10/26/2020*  . Pneumonia vaccines (2 of 2 - PCV13) 04/13/2021*  . Colon Cancer Screening  08/18/2020  . Urine Protein Check  03/03/2021  . Tetanus Vaccine  03/03/2030  . COVID-19 Vaccine  Completed  .  Hepatitis C: One time screening is recommended by Center for Disease Control  (CDC) for  adults born from [birth year] through 1967.   Completed  *Topic was postponed. The date shown is not the original due date.   Immunizations Immunization History  Administered Date(s) Administered  . Fluad Quad(high Dose 65+) 06/10/2019  . Influenza,inj,Quad PF,6+ Mos 06/26/2016  . PFIZER SARS-COV-2 Vaccination 12/22/2019, 01/12/2020  . Pneumococcal Polysaccharide-23 09/09/2018  . Tdap 03/03/2020   Keep all routine maintenance appointments.   Follow up 04/19/20 CCM  Follow up 06/09/20 @ 2:00  Advanced directives: yes, on file  Conditions/risks identified: none new.   Follow up in one year for your annual wellness visit.   Preventive Care 70 Years and Older, Male Preventive care refers to lifestyle choices and visits with your health care provider that can promote health and wellness. What does preventive care include?  A yearly physical exam. This is also called an annual well check.  Dental exams once or twice a year.  Routine eye exams. Ask your health care provider how often you should have your eyes checked.  Personal lifestyle choices, including:  Daily care of your teeth and gums.  Regular physical activity.  Eating a healthy diet.  Avoiding tobacco and  drug use.  Limiting alcohol use.  Practicing safe sex.  Taking low doses of aspirin every day.  Taking vitamin and mineral supplements as recommended by your health care provider. What happens during an annual well check? The services and screenings done by your health care provider during your annual well check will depend on your age, overall health, lifestyle risk factors, and family history of disease. Counseling  Your health care provider may ask you questions about your:  Alcohol use.  Tobacco use.  Drug use.  Emotional well-being.  Home and relationship well-being.  Sexual activity.  Eating habits.  History of falls.  Memory and ability to understand (cognition).  Work and work Statistician. Screening  You may have the following tests or measurements:  Height, weight, and BMI.  Blood pressure.  Lipid and cholesterol levels. These may be checked every 5 years, or more frequently if you are over 20 years old.  Skin check.  Lung cancer screening. You may have this screening every year starting at age 21 if you have a 30-pack-year history of smoking and currently smoke or have quit within the past 15 years.  Fecal occult blood test (FOBT) of the stool. You may have this test every year starting at age 37.  Flexible sigmoidoscopy or colonoscopy. You may have a sigmoidoscopy every 5 years or a colonoscopy every 10 years starting at age 59.  Prostate cancer screening. Recommendations will vary depending on your family history and other risks.  Hepatitis C blood test.  Hepatitis B blood test.  Sexually transmitted disease (STD) testing.  Diabetes screening. This is done by checking your blood sugar (glucose) after you have not eaten for a while (fasting). You may have this done every 1-3 years.  Abdominal aortic aneurysm (AAA) screening. You may need this if you are a current or former smoker.  Osteoporosis. You may be screened starting at age 33 if you are  at high risk. Talk with your health care provider about your test results, treatment options, and if necessary, the need for more tests. Vaccines  Your health care provider may recommend certain vaccines, such as:  Influenza vaccine. This is recommended every year.  Tetanus, diphtheria, and acellular pertussis (Tdap, Td) vaccine. You may need a Td booster every 10 years.  Zoster vaccine. You may need this after age 27.  Pneumococcal 13-valent conjugate (PCV13) vaccine. One dose is recommended after age 13.  Pneumococcal polysaccharide (PPSV23) vaccine. One dose is recommended after age 65. Talk to your health care provider about which screenings and vaccines you need and how often you need them. This information is not intended to replace advice given to you by your health care provider. Make sure you discuss any questions you have with your health care provider. Document Released: 08/11/2015 Document Revised: 04/03/2016 Document Reviewed: 05/16/2015 Elsevier Interactive Patient Education  2017 [place] Prevention in the Home Falls can cause injuries. They can happen to people of all ages. There are many things you can do to make your home safe and to help prevent falls. What can I do on the outside of my home?  Regularly fix the edges of walkways and driveways and fix any cracks.  Remove anything that might make you trip as you walk through a door, such as a raised step or threshold.  Trim any bushes or trees on the path to your home.  Use bright outdoor lighting.  Clear any walking paths of anything that might make someone trip, such as rocks or tools.  Regularly check to see if handrails are loose or broken. Make sure that both sides of any steps have handrails.  Any raised decks and porches should have guardrails on the edges.  Have any leaves, snow, or ice cleared regularly.  Use sand or salt on walking paths during winter.  Clean up any spills in your garage  right away. This includes oil or grease spills. What can I do in the bathroom?  Use night lights.  Install grab bars by the toilet and in the tub and shower. Do not use towel bars as grab bars.  Use non-skid mats or decals in the tub or shower.  If you need to sit down in the shower, use a plastic, non-slip stool.  Keep the floor dry. Clean up any water that spills on the floor as soon as it happens.  Remove soap buildup in the tub or shower regularly.  Attach bath mats securely with double-sided non-slip rug tape.  Do not have throw rugs and other things on the floor that can make you trip. What can I do in the bedroom?  Use night lights.  Make sure that you have a light by your bed that is easy to reach.  Do not use any sheets or blankets that are too big for your bed. They should not hang down onto the floor.  Have a firm chair that has side arms. You can use this for support while you get dressed.  Do not  have throw rugs and other things on the floor that can make you trip. What can I do in the kitchen?  Clean up any spills right away.  Avoid walking on wet floors.  Keep items that you use a lot in easy-to-reach places.  If you need to reach something above you, use a strong step stool that has a grab bar.  Keep electrical cords out of the way.  Do not use floor polish or wax that makes floors slippery. If you must use wax, use non-skid floor wax.  Do not have throw rugs and other things on the floor that can make you trip. What can I do with my stairs?  Do not leave any items on the stairs.  Make sure that there are handrails on both sides of the stairs and use them. Fix handrails that are broken or loose. Make sure that handrails are as long as the stairways.  Check any carpeting to make sure that it is firmly attached to the stairs. Fix any carpet that is loose or worn.  Avoid having throw rugs at the top or bottom of the stairs. If you do have throw rugs,  attach them to the floor with carpet tape.  Make sure that you have a light switch at the top of the stairs and the bottom of the stairs. If you do not have them, ask someone to add them for you. What else can I do to help prevent falls?  Wear shoes that:  Do not have high heels.  Have rubber bottoms.  Are comfortable and fit you well.  Are closed at the toe. Do not wear sandals.  If you use a stepladder:  Make sure that it is fully opened. Do not climb a closed stepladder.  Make sure that both sides of the stepladder are locked into place.  Ask someone to hold it for you, if possible.  Clearly mark and make sure that you can see:  Any grab bars or handrails.  First and last steps.  Where the edge of each step is.  Use tools that help you move around (mobility aids) if they are needed. These include:  Canes.  Walkers.  Scooters.  Crutches.  Turn on the lights when you go into a dark area. Replace any light bulbs as soon as they burn out.  Set up your furniture so you have a clear path. Avoid moving your furniture around.  If any of your floors are uneven, fix them.  If there are any pets around you, be aware of where they are.  Review your medicines with your doctor. Some medicines can make you feel dizzy. This can increase your chance of falling. Ask your doctor what other things that you can do to help prevent falls. This information is not intended to replace advice given to you by your health care provider. Make sure you discuss any questions you have with your health care provider. Document Released: 05/11/2009 Document Revised: 12/21/2015 Document Reviewed: 08/19/2014 Elsevier Interactive Patient Education  2017 [employer].

## 2020-04-14 ENCOUNTER — Telehealth: Payer: Self-pay | Admitting: *Deleted

## 2020-04-14 NOTE — Progress Notes (Signed)
Agree with the details of the visit as noted by Brian Mack, NP.  C. Laura Nikya Busler, MD [place] [facility] 

## 2020-04-14 NOTE — Telephone Encounter (Signed)
Scheduled patient for colonoscopy with Dr. Havery Moros on Tuesday 05/30/20 @ 11:30 am at [facility].

## 2020-04-19 ENCOUNTER — Ambulatory Visit (INDEPENDENT_AMBULATORY_CARE_PROVIDER_SITE_OTHER): Payer: Medicare Other | Admitting: Pharmacist

## 2020-04-19 ENCOUNTER — Telehealth: Payer: Self-pay | Admitting: Pharmacist

## 2020-04-19 ENCOUNTER — Telehealth: Payer: Self-pay | Admitting: Licensed Clinical Social Worker

## 2020-04-19 DIAGNOSIS — I255 Ischemic cardiomyopathy: Secondary | ICD-10-CM | POA: Diagnosis not present

## 2020-04-19 DIAGNOSIS — I25119 Atherosclerotic heart disease of native coronary artery with unspecified angina pectoris: Secondary | ICD-10-CM | POA: Diagnosis not present

## 2020-04-19 DIAGNOSIS — J449 Chronic obstructive pulmonary disease, unspecified: Secondary | ICD-10-CM | POA: Diagnosis not present

## 2020-04-19 DIAGNOSIS — Z87891 Personal history of nicotine dependence: Secondary | ICD-10-CM

## 2020-04-19 NOTE — Telephone Encounter (Signed)
  Chronic Care Management   Note  04/19/2020 Name: DASHUN BORRE MRN: [id number] DOB: [id number]   Attempted to contact patient for scheduled appointment for medication management support. Left HIPAA compliant message for patient to return my call at their convenience.    Plan: - If I do not hear back from the patient by end of business today, will collaborate with Care Guide to outreach to schedule follow up with me  Catie Darnelle Maffucci, PharmD, [place], [place] Pharmacist [place] [phone]

## 2020-04-19 NOTE — Patient Instructions (Addendum)
[name],   It was great to talk to you today!  Call the local [facility] [place]) OR call the hotline to discuss the SSA Extra Help application and pursue someone helping you navigate signing up for a Medicare plan. See the enclosed information about [place] [facility]. The local office is:   (Ask for [facility] Help)    Available Mon-Thurs, 8:30-4:00, No apt necessary [place]     [place]  [address] [phone]    [phone]  Also, see the enclosed information for suggestions of things you can do instead of focusing on cravings! KEEP UP THE GREAT WORK!  Ian Mills, PharmD [phone]  Visit Information  Goals Addressed              This Visit's Progress     Patient Stated   .  "I want to stay tobacco free" (pt-stated)        CARE PLAN ENTRY (see longtitudinal plan of care for additional care plan information)  Current Barriers:  . Social, community, and financial barriers:  o Notes he heard from [place] that he was likely going to be denied for Extra Help. They had noted this was due to an income his wife never has. He attempted to contact the local SSA in  but was on hold for 2 hours. Attempted to go by in person, but there was a guard and he could not go in . Polypharmacy, complex patient w/ multiple comorbidities including COPD, CHF, CAD, tobacco abuse . Tobacco abuse of ~40 years;  o Patient CONFIRMS he has remained tobacco free since ~11/09/19. Notes that since he had to quit [name] therapy, he is struggling with cravings, mostly in the afternoon. Determines that this is a combination of being bored, and that this was generally a time he would smoke.  Marland Kitchen CAD s/p prior stenting; ASA 81 mg daily, clopidogrel 75 mg daily; atorvastatin 80 mg daily, ezetimibe 10 mg daily;  o LDL >70, was 72 on last check.  . HFrEF s/p AICD placement (most recent EF 25-30%); Entresto 97/103 mg BID,  carvedilol 3.125 mg BID, furosemide 80 mg daily (2 40 mg tabs at once), potassium 10 mEq daily o Home BP readings: 110s/60s; HR 60s o Weight: Reports he's lost weight per office scales, but does not have a scale at home. Cannot afford one.  o APPROVED for Entresto assistance through 08/11/2020 . Ventricular tachycardia: amiodarone 300 mg daily; . COPD: Stiolto 2.5/2.5 mg daily 2 puffs daily. Infrequent albuterol use. Recent appt w/ Pulmonary Wyn Quaker. Will pursue PFT at next f/u o APPROVED for Stiolto assistance through 07/28/20  Pharmacist Clinical Goal(s):  Marland Kitchen Over the next 90 days, patient will work with multidisciplinary team on medication access.  Interventions: . Comprehensive medication review performed, medication list updated in electronic medical record . Inter-disciplinary care team collaboration (see longitudinal plan of care) . Placed Care Guide referral for support w/ collaboration w/ Social Security Administration and options for affordable home scale. Will also message cardiology team to determine if they have any resources . Reviewed upcoming colonoscopy. Advised that he remind GI team that he does not have prescription insurance when they are choosing a colonoscopy prep. . Will mail patient information about [place] [facility] and local [place] [facility] counselor.  . Reviewed that [name] recall is still in effect. Brainstormed things patient can do to prevent focus on tobacco cravings. Providing resources from [employer] and [employer]   Patient Self Care Activities:  . Patient will commit to remaining tobacco-free  Please see past updates related to this goal by clicking on the "Past Updates" button in the selected goal         The patient verbalized understanding of instructions provided today and agreed to receive a mailed copy of patient instruction and/or educational materials.  Plan:  - Offered f/u call in ~ 6-8 weeks. Patient requested sooner f/u call as  accountability to aid in tobacco cessation. Scheduled f/u call in ~ 4 weeks  Ian Mills, PharmD, [place], [place] Pharmacist [place] [phone]

## 2020-04-19 NOTE — Chronic Care Management (AMB) (Signed)
Chronic Care Management   Follow Up Note   04/19/2020 Name: Ian Mills MRN: [id number] DOB: 03-Jun-1956  Referred by: McLean-Scocuzza, Nino Glow, MD Reason for referral : Chronic Care Management (Medication Management)   Ian Mills is a 64 y.o. year old male who is a primary care patient of McLean-Scocuzza, Nino Glow, MD. The CCM team was consulted for assistance with chronic disease management and care coordination needs.    Contacted patient for medication management review  Review of patient status, including review of consultants reports, relevant laboratory and other test results, and collaboration with appropriate care team members and the patient's provider was performed as part of comprehensive patient evaluation and provision of chronic care management services.    SDOH (Social Determinants of Health) assessments performed: Yes See Care Plan activities for detailed interventions related to SDOH)  SDOH Interventions     Most Recent Value  SDOH Interventions  SDOH Interventions for the Following Domains Tobacco  Financial Strain Interventions Other (Comment)  [care guide referral]  Tobacco Interventions Cessation Materials Given and Reviewed       Outpatient Encounter Medications as of 04/19/2020  Medication Sig  . ALPRAZolam (XANAX) 0.5 MG tablet Take 1 tablet (0.5 mg total) by mouth 3 (three) times daily as needed. for anxiety  . amiodarone (PACERONE) 200 MG tablet Take 1.5 tablets (300 mg total) by mouth daily.  Marland Kitchen aspirin 81 MG EC tablet Take 1 tablet (81 mg total) by mouth daily.  Marland Kitchen atorvastatin (LIPITOR) 80 MG tablet Take 1 tablet (80 mg total) by mouth daily.  . carvedilol (COREG) 3.125 MG tablet Take 1 tablet (3.125 mg total) by mouth 2 (two) times daily.  . clopidogrel (PLAVIX) 75 MG tablet Take 1 tablet (75 mg total) by mouth daily.  Marland Kitchen ezetimibe (ZETIA) 10 MG tablet Take 1 tablet (10 mg total) by mouth daily.  . finasteride (PROSCAR) 5 MG tablet Take 5 mg by  mouth daily.  . furosemide (LASIX) 40 MG tablet Take 1 tablet by mouth twice daily  . potassium chloride (KLOR-CON 10) 10 MEQ tablet Take 1 tablet (10 mEq total) by mouth daily.  . sacubitril-valsartan (ENTRESTO) 97-103 MG Take 1 tablet by mouth 2 (two) times daily.  . Tiotropium Bromide-Olodaterol (STIOLTO RESPIMAT) 2.5-2.5 MCG/ACT AERS Inhale 2 puffs into the lungs daily.  Marland Kitchen albuterol (VENTOLIN HFA) 108 (90 Base) MCG/ACT inhaler Inhale 1 puff into the lungs every 6 (six) hours as needed for wheezing or shortness of breath. (Patient not taking: Reported on 04/19/2020)  . ipratropium-albuterol (DUONEB) 0.5-2.5 (3) MG/3ML SOLN Take 3 mLs by nebulization every 6 (six) hours as needed.  . tadalafil (CIALIS) 10 MG tablet Take 1 tablet (10 mg total) by mouth daily as needed for erectile dysfunction (take 30 min prior to when needed.).   No facility-administered encounter medications on file as of 04/19/2020.     Objective:   Goals Addressed              This Visit's Progress     Patient Stated   .  "I want to stay tobacco free" (pt-stated)        CARE PLAN ENTRY (see longtitudinal plan of care for additional care plan information)  Current Barriers:  . Social, community, and financial barriers:  o Notes he heard from [name] that he was likely going to be denied for Extra Help. They had noted this was due to an income his wife never has. He attempted to contact the local SSA  in [name] but was on hold for 2 hours. Attempted to go by in person, but there was a guard and he could not go in . Polypharmacy, complex patient w/ multiple comorbidities including COPD, CHF, CAD, tobacco abuse . Tobacco abuse of ~40 years;  o Patient CONFIRMS he has remained tobacco free since ~11/09/19. Notes that since he had to quit [name] therapy, he is struggling with cravings, mostly in the afternoon. Determines that this is a combination of being bored, and that this was generally a time he would smoke.  Marland Kitchen CAD  s/p prior stenting; ASA 81 mg daily, clopidogrel 75 mg daily; atorvastatin 80 mg daily, ezetimibe 10 mg daily;  o LDL >70, was 72 on last check.  . HFrEF s/p AICD placement (most recent EF 25-30%); Entresto 97/103 mg BID, carvedilol 3.125 mg BID, furosemide 80 mg daily (2 40 mg tabs at once), potassium 10 mEq daily o Home BP readings: 110s/60s; HR 60s o Weight: Reports he's lost weight per office scales, but does not have a scale at home. Cannot afford one.  o APPROVED for Entresto assistance through 08/11/2020 . Ventricular tachycardia: amiodarone 300 mg daily; . COPD: Stiolto 2.5/2.5 mg daily 2 puffs daily. Infrequent albuterol use. Recent appt w/ Pulmonary Wyn Quaker. Will pursue PFT at next f/u o APPROVED for Stiolto assistance through 07/28/20  Pharmacist Clinical Goal(s):  Marland Kitchen Over the next 90 days, patient will work with multidisciplinary team on medication access.  Interventions: . Comprehensive medication review performed, medication list updated in electronic medical record . Inter-disciplinary care team collaboration (see longitudinal plan of care) . Placed Care Guide referral for support w/ collaboration w/ Social Security Administration and options for affordable home scale. Will also message cardiology team to determine if they have any resources . Reviewed upcoming colonoscopy. Advised that he remind GI team that he does not have prescription insurance when they are choosing a colonoscopy prep. . Will mail patient information about [name] and local [name] counselor.  . Reviewed that [name] recall is still in effect. Brainstormed things patient can do to prevent focus on tobacco cravings. Providing resources from [employer] and [place]   Patient Self Care Activities:  . Patient will commit to remaining tobacco-free  Please see past updates related to this goal by clicking on the "Past Updates" button in the selected goal          Plan:  - Offered f/u  call in ~ 6-8 weeks. Patient requested sooner f/u call as accountability to aid in tobacco cessation. Scheduled f/u call in ~ 4 weeks  Catie Darnelle Maffucci, PharmD, [place], [place] Pharmacist [place] [phone]

## 2020-04-19 NOTE — Telephone Encounter (Signed)
Patient returned call. See CCM documentation 

## 2020-04-19 NOTE — Telephone Encounter (Signed)
CSW received request to get pt a scale for home use- spoke with pt who is agreeable- scale shipped out to pt home anticipated delivery Friday  Jorge Ny, [facility] Desk#: [phone] Cell#: [phone]

## 2020-04-24 ENCOUNTER — Telehealth: Payer: Self-pay | Admitting: Internal Medicine

## 2020-04-24 NOTE — Telephone Encounter (Signed)
   [facility] 04/24/2020 1st Attempt   Name: Ian Mills   MRN: [id number]   DOB: 1956-04-27   AGE: 64 y.o.   GENDER: male   PCP McLean-Scocuzza, Nino Glow, MD.   Called patient regarding Community Resource Referral for assistance with applying for Medicare Extra Help and equipment. Patient did not answer. Could not leave message due to no voicemail pick up. Will try to contact patient again in within the week.    [place], Care Management Phone: [phone] Email: [email or web address]

## 2020-05-01 ENCOUNTER — Telehealth: Payer: Self-pay | Admitting: Internal Medicine

## 2020-05-01 ENCOUNTER — Encounter: Payer: Self-pay | Admitting: *Deleted

## 2020-05-01 NOTE — Telephone Encounter (Signed)
   [facility] 05/01/2020   Name: EZREAL TURAY   MRN: [id number]   DOB: 12/14/1955   AGE: 64 y.o.   GENDER: male   PCP McLean-Scocuzza, Nino Glow, MD.    Received message from [facility] that patient called the office to speak with Care Guide. Returned call to patient. Left message for patient to give Care Guide a call back.    [place], Care Management Phone: [phone] Email: [email or web address]

## 2020-05-01 NOTE — Telephone Encounter (Signed)
Thank you! I did not call pt spoke with [facility]. :) I called pt back and transferred him to her vm so he may call us back.

## 2020-05-01 NOTE — Telephone Encounter (Signed)
   [facility] 05/01/2020   Name: Ian Mills   MRN: [id number]   DOB: July 05, 1956   AGE: 64 y.o.   GENDER: male   PCP McLean-Scocuzza, Nino Glow, MD.    Spoke with [name] today regarding referral. Patient stated that he will give Care Guide a call back to discuss.    [place], Care Management Phone: [phone] Email: [email or web address]

## 2020-05-01 NOTE — Telephone Encounter (Signed)
Left message for patient to call the office

## 2020-05-01 NOTE — Telephone Encounter (Signed)
Patient states he was returning referrals call, please call him back.

## 2020-05-03 ENCOUNTER — Telehealth: Payer: Self-pay | Admitting: *Deleted

## 2020-05-03 ENCOUNTER — Encounter: Payer: Self-pay | Admitting: *Deleted

## 2020-05-03 NOTE — Telephone Encounter (Signed)
   [facility] 05/03/2020   Name: Ian Mills   MRN: [id number]   DOB: Oct 26, 1955   AGE: 64 y.o.   GENDER: male   PCP McLean-Scocuzza, Nino Glow, MD.      Returned call to patient regarding referral. Left message for patient to give Care Guide a call back. Will try to call patient again at a later time.    [place], Care Management Phone: [phone] Email: [email or web address]

## 2020-05-03 NOTE — Telephone Encounter (Signed)
Spoke with patient informed him of time of procedure and scheduled COVID testing. Will mail out instructions.

## 2020-05-03 NOTE — Telephone Encounter (Signed)
   Ian Mills Jul 18, 1956 [id number]  Dear Dr. Terese Door:  We have scheduled the above named patient for a(n) colonoscopy procedure. Our records show that (s)he is on anticoagulation therapy.  Please advise as to whether the patient may come off their therapy of Plavix 5 days prior to their procedure which is scheduled for Tuesday 05/30/20.  Please route your response to Caryl Asp, [place] or fax response to [phone].  Sincerely,   Caryl Asp, [facility]

## 2020-05-05 ENCOUNTER — Encounter: Payer: Self-pay | Admitting: *Deleted

## 2020-05-05 MED ORDER — SUTAB 1479-225-188 MG PO TABS: 1.0000 | ORAL_TABLET | Freq: Once | ORAL | 0 refills | Status: AC

## 2020-05-05 NOTE — Telephone Encounter (Signed)
Script for SUTAB sent to pharmacy. Instructions mailed to patient home.

## 2020-05-08 ENCOUNTER — Telehealth: Payer: Self-pay | Admitting: *Deleted

## 2020-05-08 ENCOUNTER — Ambulatory Visit: Payer: Medicare Other | Admitting: Pharmacist

## 2020-05-08 DIAGNOSIS — Z87891 Personal history of nicotine dependence: Secondary | ICD-10-CM

## 2020-05-08 DIAGNOSIS — I25119 Atherosclerotic heart disease of native coronary artery with unspecified angina pectoris: Secondary | ICD-10-CM

## 2020-05-08 DIAGNOSIS — E782 Mixed hyperlipidemia: Secondary | ICD-10-CM

## 2020-05-08 NOTE — Telephone Encounter (Signed)
[facility] Pre-operative Risk Assessment     Request for surgical clearance:     Endoscopy Procedure  What type of surgery is being performed?     Colonoscopy  When is this surgery scheduled?     Tuesday 05/30/20  What type of clearance is required ?   Pharmacy  Are there any medications that need to be held prior to surgery and how long? Plavix 5 days  Practice name and name of physician performing surgery?      [facility]  What is your office phone and fax number?      Phone- [phone]  [phone]  Anesthesia type (None, local, MAC, general) ?       MAC

## 2020-05-08 NOTE — Chronic Care Management (AMB) (Signed)
Chronic Care Management   Follow Up Note   05/08/2020 Name: Ian Mills MRN: [id number] DOB: 1956-04-15  Referred by: McLean-Scocuzza, Nino Glow, MD Reason for referral : Chronic Care Management (Medication Management)   Ian Mills is a 64 y.o. year old male who is a primary care patient of McLean-Scocuzza, Nino Glow, MD. The CCM team was consulted for assistance with chronic disease management and care coordination needs.    Received call from patient with medication access concern.  Review of patient status, including review of consultants reports, relevant laboratory and other test results, and collaboration with appropriate care team members and the patient's provider was performed as part of comprehensive patient evaluation and provision of chronic care management services.    SDOH (Social Determinants of Health) assessments performed: Yes See Care Plan activities for detailed interventions related to SDOH)  SDOH Interventions     Most Recent Value  SDOH Interventions  Financial Strain Interventions Other (Comment)  [collaboration w/ provider for more cost-effective alternative]       Outpatient Encounter Medications as of 05/08/2020  Medication Sig  . albuterol (VENTOLIN HFA) 108 (90 Base) MCG/ACT inhaler Inhale 1 puff into the lungs every 6 (six) hours as needed for wheezing or shortness of breath. (Patient not taking: Reported on 04/19/2020)  . ALPRAZolam (XANAX) 0.5 MG tablet Take 1 tablet (0.5 mg total) by mouth 3 (three) times daily as needed. for anxiety  . amiodarone (PACERONE) 200 MG tablet Take 1.5 tablets (300 mg total) by mouth daily.  Marland Kitchen aspirin 81 MG EC tablet Take 1 tablet (81 mg total) by mouth daily.  Marland Kitchen atorvastatin (LIPITOR) 80 MG tablet Take 1 tablet (80 mg total) by mouth daily.  . carvedilol (COREG) 3.125 MG tablet Take 1 tablet (3.125 mg total) by mouth 2 (two) times daily.  . clopidogrel (PLAVIX) 75 MG tablet Take 1 tablet (75 mg total) by mouth daily.    Marland Kitchen ezetimibe (ZETIA) 10 MG tablet Take 1 tablet (10 mg total) by mouth daily.  . finasteride (PROSCAR) 5 MG tablet Take 5 mg by mouth daily.  . furosemide (LASIX) 40 MG tablet Take 1 tablet by mouth twice daily  . ipratropium-albuterol (DUONEB) 0.5-2.5 (3) MG/3ML SOLN Take 3 mLs by nebulization every 6 (six) hours as needed.  . potassium chloride (KLOR-CON 10) 10 MEQ tablet Take 1 tablet (10 mEq total) by mouth daily.  . sacubitril-valsartan (ENTRESTO) 97-103 MG Take 1 tablet by mouth 2 (two) times daily.  . tadalafil (CIALIS) 10 MG tablet Take 1 tablet (10 mg total) by mouth daily as needed for erectile dysfunction (take 30 min prior to when needed.).  Marland Kitchen Tiotropium Bromide-Olodaterol (STIOLTO RESPIMAT) 2.5-2.5 MCG/ACT AERS Inhale 2 puffs into the lungs daily.   No facility-administered encounter medications on file as of 05/08/2020.     Objective:   Goals Addressed              This Visit's Progress     Patient Stated   .  "I want to stay tobacco free" (pt-stated)        CARE PLAN ENTRY (see longtitudinal plan of care for additional care plan information)  Current Barriers:  . Social, community, and financial barriers:  o Unable to afford the colonoscopy prep that was sent for him. $160. Patient has no prescription insurnace . Marland Kitchen Polypharmacy, complex patient w/ multiple comorbidities including COPD, CHF, CAD, tobacco abuse . Tobacco abuse of ~40 years; Tobacco free since ~11/09/19. Marland Kitchen CAD s/p prior  stenting; ASA 81 mg daily, clopidogrel 75 mg daily; atorvastatin 80 mg daily, ezetimibe 10 mg daily;  o LDL >70, was 72 on last check.  . HFrEF s/p AICD placement (most recent EF 25-30%); Entresto 97/103 mg BID, carvedilol 3.125 mg BID, furosemide 80 mg daily (2 40 mg tabs at once), potassium 10 mEq daily o Discussed providing with a scale through Cardiology clinic o APPROVED for Entresto assistance through 08/11/2020 . Ventricular tachycardia: amiodarone 300 mg daily; . COPD: Stiolto  2.5/2.5 mg daily 2 puffs daily. Infrequent albuterol use. Recent appt w/ Pulmonary Wyn Quaker. Will pursue PFT at next f/u o APPROVED for Stiolto assistance through 07/28/20  Pharmacist Clinical Goal(s):  Marland Kitchen Over the next 90 days, patient will work with multidisciplinary team on medication access.  Interventions: . Encouraged to call GI to let them know the prep was too expensive and see about alternative options. Will also route this message to GI [place]  Patient Self Care Activities:  . Patient will commit to remaining tobacco-free  Please see past updates related to this goal by clicking on the "Past Updates" button in the selected goal          Plan:  - Will f/u as previously scheduled  Catie Darnelle Maffucci, PharmD, [place], [place] Pharmacist [place] [phone]

## 2020-05-08 NOTE — Telephone Encounter (Signed)
Pt wants Heather tcb to recommend another alternative to pre op meds.  Pt states that he cannot afford the meds we perscirbed

## 2020-05-08 NOTE — Telephone Encounter (Signed)
I will send clearance to cardiology group.

## 2020-05-08 NOTE — Telephone Encounter (Signed)
[id number] scheduled appt in [facility] w/Dr [place] 05/24/2020 @ 9:20 AM. As this is the only appt available in [place]. Left message to CB if unable to make this appt.

## 2020-05-08 NOTE — Telephone Encounter (Signed)
Primary Cardiologist:Timothy Rockey Situ, MD  Chart reviewed as part of pre-operative protocol coverage. Because of Ian Mills past medical history and time since last visit, he/she will require a follow-up visit in order to better assess preoperative cardiovascular risk.  Pre-op covering staff: - Please schedule appointment and call patient to inform them. - Please contact requesting surgeon's office via preferred method (i.e, phone, fax) to inform them of need for appointment prior to surgery.  If applicable, this message will also be routed to pharmacy pool and/or primary cardiologist for input on holding anticoagulant/antiplatelet agent as requested below so that this information is available at time of patient's appointment.   Deberah Pelton, NP  05/08/2020, 9:11 AM

## 2020-05-08 NOTE — Patient Instructions (Signed)
Visit Information  Goals Addressed              This Visit's Progress     Patient Stated   .  "I want to stay tobacco free" (pt-stated)        CARE PLAN ENTRY (see longtitudinal plan of care for additional care plan information)  Current Barriers:  . Social, community, and financial barriers:  o Unable to afford the colonoscopy prep that was sent for him. $160. Patient has no prescription insurnace . Marland Kitchen Polypharmacy, complex patient w/ multiple comorbidities including COPD, CHF, CAD, tobacco abuse . Tobacco abuse of ~40 years; Tobacco free since ~11/09/19. Marland Kitchen CAD s/p prior stenting; ASA 81 mg daily, clopidogrel 75 mg daily; atorvastatin 80 mg daily, ezetimibe 10 mg daily;  o LDL >70, was 72 on last check.  . HFrEF s/p AICD placement (most recent EF 25-30%); Entresto 97/103 mg BID, carvedilol 3.125 mg BID, furosemide 80 mg daily (2 40 mg tabs at once), potassium 10 mEq daily o Discussed providing with a scale through Cardiology clinic o APPROVED for Entresto assistance through 08/11/2020 . Ventricular tachycardia: amiodarone 300 mg daily; . COPD: Stiolto 2.5/2.5 mg daily 2 puffs daily. Infrequent albuterol use. Recent appt w/ Pulmonary Wyn Quaker. Will pursue PFT at next f/u o APPROVED for Stiolto assistance through 07/28/20  Pharmacist Clinical Goal(s):  Marland Kitchen Over the next 90 days, patient will work with multidisciplinary team on medication access.  Interventions: . Encouraged to call GI to let them know the prep was too expensive and see about alternative options. Will also route this message to GI [place]  Patient Self Care Activities:  . Patient will commit to remaining tobacco-free  Please see past updates related to this goal by clicking on the "Past Updates" button in the selected goal         The patient verbalized understanding of instructions provided today and declined a print copy of patient instruction materials.   Plan:  - Will f/u as previously  scheduled  Catie Darnelle Maffucci, PharmD, [place], [place] Pharmacist [place] [phone]

## 2020-05-08 NOTE — Telephone Encounter (Signed)
This patient would need cardiac clearance NOT PCP  FYI  -If patients on blood thinners via cards or hematology this would be who needs to clear them NOT PCP  Cc'ed cards on this message

## 2020-05-09 ENCOUNTER — Telehealth: Payer: Self-pay | Admitting: Gastroenterology

## 2020-05-09 NOTE — Telephone Encounter (Signed)
Pt called asking to speak with Nira Conn, he would like to speak with her about his upcoming procedure on 11/2. Pls call pt.

## 2020-05-09 NOTE — Telephone Encounter (Signed)
Pt has appt with Dr. Rockey Situ for pre op assessment 05/24/20. Will forward notes to MD for upcoming appt. I will send FYI to requesting office pt has appt. Will remove from the pre op call back pool.

## 2020-05-09 NOTE — Telephone Encounter (Signed)
Spoke with patient and will provide patient with a sample of Sutab.

## 2020-05-10 ENCOUNTER — Telehealth: Payer: Medicare Other

## 2020-05-10 NOTE — Telephone Encounter (Signed)
Moderate but acceptable risk for procedure Would hold Plavix 5 days prior to procedure, restart following procedure, stay on aspirin throughout

## 2020-05-12 NOTE — Telephone Encounter (Signed)
Please inform the patient to hold plavix for 5 days prior to the procedure and restart following the procedure. Per Dr. Rockey Situ, Ian Mills needs to stay on the aspirin through the procedure. Do not stop aspirin.

## 2020-05-15 ENCOUNTER — Telehealth: Payer: Self-pay | Admitting: Cardiovascular Disease

## 2020-05-15 NOTE — Telephone Encounter (Signed)
Left voicemail message to call back  

## 2020-05-15 NOTE — Telephone Encounter (Signed)
Patient returning call about holding meds for procedure .  Patient notified there is no note that we called and upcoming visit is to determine clearance

## 2020-05-15 NOTE — Telephone Encounter (Signed)
Forwarded to requesting providers office. Left detailed message for pt to hold plavix and keep taking ASA

## 2020-05-16 NOTE — Telephone Encounter (Signed)
   SF 05/16/2020   Name: Ian Mills   MRN: [id number]   DOB: 09-03-1955   AGE: 64 y.o.   GENDER: male   PCP McLean-Scocuzza, Nino Glow, MD.    Called patient regarding referral for assistance with Medicare Extra Help. Patient did not answer. Left message for patient to give office a call if he still needs assistance. Care Guide has been unable to connect with patient. Referral will be closed for unable to locate.   Closing referral pending any other needs of patient.     [place], Care Management Phone: [phone] Email: [email or web address]

## 2020-05-17 ENCOUNTER — Telehealth: Payer: Self-pay | Admitting: Gastroenterology

## 2020-05-17 NOTE — Telephone Encounter (Signed)
Left message for patient to call office.  

## 2020-05-17 NOTE — Telephone Encounter (Signed)
Pt is requesting a call back from [place], pt states he missed a call.

## 2020-05-18 ENCOUNTER — Telehealth: Payer: Self-pay | Admitting: Pharmacist

## 2020-05-18 NOTE — Telephone Encounter (Signed)
Left message for patient to call office.  

## 2020-05-18 NOTE — Telephone Encounter (Signed)
Spoke with patient and he confirmed he did receive his instructions for his upcoming procedure.

## 2020-05-18 NOTE — Progress Notes (Signed)
Patient calls requesting alternative GI referral. He notes that driving to [place] multiple times for COVID tests and upcoming colonoscopy is going to be difficult.   Routing to PCP.

## 2020-05-18 NOTE — Telephone Encounter (Signed)
Spoke with patient informed him to hold Plavix for 5 days prior to procedure. Patient voiced understanding.

## 2020-05-23 NOTE — Progress Notes (Deleted)
Date:  05/23/2020   ID:  Ian Mills, DOB 1956/04/28, MRN [id number]  Patient Location:  [place] [address]   Provider location:   [facility] [place], [place] office  PCP:  McLean-Scocuzza, Nino Glow, MD  Cardiologist:  [facility]  No chief complaint on file.   History of Present Illness:    Ian Mills is a 64 y.o. male past medical history of Smoking, continues to smoke 1 pack a week coronary artery disease,  3 stents placed to his mid LAD, one stent to the PDA, one stent to the OM 3 in 08/2006  chest pressure during his divorce with stress test at [facility],  cardiac catheterization at [facility], 11/2011 syncope x4 episodes total over the past several years ECHO EF 25 to 30%, 2015 ICD, ejection fraction 30-35%, chronic back and hip pain He presents today for follow-up of his coronary artery disease,  Heart catheterization last month  Stress test August 10, 2019 showing fixed defects anterior wall  Heart cath 08/30/2019 1. Significant underlying three-vessel coronary artery disease with chronically occluded mid LAD, patent first diagonal stent, patent RCA stents with mild in-stent restenosis, patent left circumflex stent with moderate in-stent restenosis.  Progression of ostial left circumflex stenosis to 60%. 2.  Left ventricular angiography was not performed due to chronic kidney disease. 3.  Moderately elevated left ventricular end-diastolic pressure between 25 to 30 mmHg.  medical therapy recommended.    Since discharge from the hospital, denies significant chest pain No leg swelling, no orthopnea or shortness of breath on exertion No recent COPD exacerbation  He is concerned that Dr. Fletcher Anon mention he might have a blockage going to his kidneys No record of this can be found Catheterization images pulled up and reviewed  Recent lab work reviewed showing creatinine up to 2.24,  Creatinine improved by holding Lasix  , creatinine down to 1.5  EKG personally reviewed by myself on todays visit Shows normal sinus rhythm rate 57 bpm old anterior MI  Her past medical history reviewed Seen in the hospital February 2020 for COPD exacerbation PNA  Previously with back and sciatic pain, improved with cortisone shots, two years ago seen by [facility] for chronic back pain   Past Medical History:  Diagnosis Date  . Coronary artery disease    a. 3 stents to the LAD, 1 to the PDA, and 1 to the OM3; b. cath 06/2013: chronically occluded LAD, patent stent LAD, D1 20%, pLCx 30%, mLCx 20%, pRCA 70% (FFR 0.74) s/p PCI/DES, mRCA 70% s/p PCI/DES, RPDA 50%, EF 20% w/ AK of anterolateral & apical walls, moderately elevated LVEDP  . HFrEF (heart failure with reduced ejection fraction) ([place])    a. 05/2014 Echo: EF 25-30%; b. 01/2018 Echo ([facility]): EF 30%.  Marland Kitchen HLD (hyperlipidemia)   . HTN (hypertension)   . ICD  single,BSX    a. DOI 11/2011; b. S/N# [id number]  . Ischemic cardiomyopathy    a. 05/2014 Echo: EF 25-30%; b. 01/2018 Echo ([facility]): EF 30%, sev glob HK w/ apical/ant AK. Mild LVH. Mod LAE, mild RAE. Triv MR/TR.  Marland Kitchen Pneumonia    08/2018  . Syncope and collapse   . Ventricular tachycardia ([place]) 11/2011   a. s/p AICD implant    Past Surgical History:  Procedure Laterality Date  . appendectomy    . CARDIAC CATHETERIZATION  Nov 30 2011   [facility]  . CARDIAC DEFIBRILLATOR PLACEMENT  May  2013  . CORONARY ANGIOPLASTY  2014   x 2 stents  . CORONARY STENT PLACEMENT     x7  . LEFT HEART CATH AND CORONARY ANGIOGRAPHY Left 08/30/2019   Procedure: LEFT HEART CATH AND CORONARY ANGIOGRAPHY;  Surgeon: Wellington Hampshire, MD;  Location: [facility];  Service: Cardiovascular;  Laterality: Left;  . PROSTATE SURGERY     urethra grew around prostate   . TUMOR REMOVAL  2001   chest thymic cyst; behind lungs [facility] benign      No outpatient medications have been marked as taking for the 05/24/20 encounter (Appointment) with  Minna Merritts, MD.     Allergies:   Nitroglycerin   Social History   Tobacco Use  . Smoking status: Former Smoker    Packs/day: 0.50    Years: 42.00    Pack years: 21.00    Types: Cigarettes    Quit date: 11/10/2019    Years since quitting: 0.5  . Smokeless tobacco: Never Used  Vaping Use  . Vaping Use: Never used  Substance Use Topics  . Alcohol use: Yes    Alcohol/week: 1.0 standard drink    Types: 1 Cans of beer per week    Comment: per week  . Drug use: No     Current Outpatient Medications on File Prior to Visit  Medication Sig Dispense Refill  . albuterol (VENTOLIN HFA) 108 (90 Base) MCG/ACT inhaler Inhale 1 puff into the lungs every 6 (six) hours as needed for wheezing or shortness of breath. (Patient not taking: Reported on 04/19/2020)    . ALPRAZolam (XANAX) 0.5 MG tablet Take 1 tablet (0.5 mg total) by mouth 3 (three) times daily as needed. for anxiety 90 tablet 5  . amiodarone (PACERONE) 200 MG tablet Take 1.5 tablets (300 mg total) by mouth daily. 135 tablet 3  . aspirin 81 MG EC tablet Take 1 tablet (81 mg total) by mouth daily. 30 tablet 6  . atorvastatin (LIPITOR) 80 MG tablet Take 1 tablet (80 mg total) by mouth daily. 90 tablet 0  . carvedilol (COREG) 3.125 MG tablet Take 1 tablet (3.125 mg total) by mouth 2 (two) times daily. 180 tablet 3  . clopidogrel (PLAVIX) 75 MG tablet Take 1 tablet (75 mg total) by mouth daily. 90 tablet 3  . ezetimibe (ZETIA) 10 MG tablet Take 1 tablet (10 mg total) by mouth daily. 90 tablet 3  . finasteride (PROSCAR) 5 MG tablet Take 5 mg by mouth daily.    . furosemide (LASIX) 40 MG tablet Take 1 tablet by mouth twice daily 180 tablet 0  . ipratropium-albuterol (DUONEB) 0.5-2.5 (3) MG/3ML SOLN Take 3 mLs by nebulization every 6 (six) hours as needed. 360 mL 0  . potassium chloride (KLOR-CON 10) 10 MEQ tablet Take 1 tablet (10 mEq total) by mouth daily. 90 tablet 3  . sacubitril-valsartan (ENTRESTO) 97-103 MG Take 1 tablet by mouth  2 (two) times daily. 180 tablet 3  . tadalafil (CIALIS) 10 MG tablet Take 1 tablet (10 mg total) by mouth daily as needed for erectile dysfunction (take 30 min prior to when needed.). 10 tablet 3  . Tiotropium Bromide-Olodaterol (STIOLTO RESPIMAT) 2.5-2.5 MCG/ACT AERS Inhale 2 puffs into the lungs daily. 4 g 0   No current facility-administered medications on file prior to visit.     Family Hx: The patient's family history includes Cancer in his father; Cancer - Lung in his mother; Dementia in his maternal grandmother; Heart attack in his  mother and paternal grandfather; Hypertension in his mother. There is no history of Colon cancer, Stomach cancer, or Pancreatic cancer.  ROS:   Please see the history of present illness.    Review of Systems  Constitutional: Negative.   Respiratory: Positive for shortness of breath.   Cardiovascular: Negative.   Gastrointestinal: Negative.   Musculoskeletal: Negative.   Neurological: Negative.   Psychiatric/Behavioral: Negative.   All other systems reviewed and are negative.    Labs/Other Tests and Data Reviewed:    Recent Labs: 06/10/2019: TSH 1.94 03/03/2020: ALT 34; BUN 25; Creat 1.46; Hemoglobin 15.3; Platelets 172; Potassium 4.6; Sodium 138   Recent Lipid Panel Lab Results  Component Value Date/Time   CHOL 146 03/03/2020 02:17 PM   CHOL 170 12/09/2016 11:03 AM   CHOL 111 01/20/2014 09:29 AM   TRIG 92 03/03/2020 02:17 PM   TRIG 88 01/20/2014 09:29 AM   HDL 56 03/03/2020 02:17 PM   HDL 53 12/09/2016 11:03 AM   HDL 39 (L) 01/20/2014 09:29 AM   CHOLHDL 2.6 03/03/2020 02:17 PM   LDLCALC 72 03/03/2020 02:17 PM   LDLCALC 54 01/20/2014 09:29 AM    Wt Readings from Last 3 Encounters:  04/13/20 208 lb (94.3 kg)  03/27/20 208 lb 9.6 oz (94.6 kg)  03/03/20 210 lb 3.2 oz (95.3 kg)     Exam:    There were no vitals taken for this visit. Constitutional:  oriented to person, place, and time. No distress.  HENT:  Head: Grossly normal Eyes:   no discharge. No scleral icterus.  Neck: No JVD, no carotid bruits  Cardiovascular: Regular rate and rhythm, no murmurs appreciated Pulmonary/Chest: Clear to auscultation bilaterally, no wheezes or rails Abdominal: Soft.  no distension.  no tenderness.  Musculoskeletal: Normal range of motion Neurological:  normal muscle tone. Coordination normal. No atrophy Skin: Skin warm and dry Psychiatric: normal affect, pleasant   ASSESSMENT & PLAN:    Coronary artery disease involving native coronary artery of native heart without angina pectoris -  Recent heart catheterization results reviewed with him, occluded LAD, known from prior studies ICD in place Lipitor new prescription up to 80 mg daily Stay on Zetia --- At his request, renal artery ultrasound ordered Recent AKI, known PAD Reports that he was told by interventional cardiology he had renal artery obstruction/stenosis  Spinal stenosis of lumbar region, unspecified whether neurogenic claudication present -  Followed at [facility] ,  Symptoms improved after cortisone shots Stable symptoms  Ventricular tachycardia ([place]) ICD downloads reviewed  Denies any shocks Stable  Ischemic cardiomyopathy Ejection fraction 30-35%, 2015  no new symptoms Recent catheterization reviewed, no changes to his medications  Mixed hyperlipidemia Continue Lipitor up to 80 mg daily and Zetia Goal LDL less than 70  Single implantable cardioverter-defibrillator-BSx Followed by EP.  COPD Chronic shortness of breath Now on oxygen when walking long distances He reports symptoms are stable, no recent COPD exacerbation  Acute on chronic systolic CHF (congestive heart failure) ([place])  continue Lasix , Coreg , Entresto, Aldactone Recommend he hold Lasix on the weekends given  prerenal state on Lasix 40 daily   Total encounter time more than 40 minutes  Greater than 50% was spent in counseling and coordination of care with the  patient   Disposition: Follow-up in 6 months   Signed, Ida Rogue, MD  05/23/2020 6:25 PM    Ian Mills, Ian Mills, Ian Mills

## 2020-05-24 ENCOUNTER — Telehealth: Payer: Self-pay | Admitting: Gastroenterology

## 2020-05-24 ENCOUNTER — Ambulatory Visit: Payer: Medicare Other | Admitting: Cardiovascular Disease

## 2020-05-24 DIAGNOSIS — I5020 Unspecified systolic (congestive) heart failure: Secondary | ICD-10-CM

## 2020-05-24 DIAGNOSIS — I5022 Chronic systolic (congestive) heart failure: Secondary | ICD-10-CM

## 2020-05-24 DIAGNOSIS — Z720 Tobacco use: Secondary | ICD-10-CM

## 2020-05-24 DIAGNOSIS — I472 Ventricular tachycardia: Secondary | ICD-10-CM

## 2020-05-24 DIAGNOSIS — I25118 Atherosclerotic heart disease of native coronary artery with other forms of angina pectoris: Secondary | ICD-10-CM

## 2020-05-24 DIAGNOSIS — I10 Essential (primary) hypertension: Secondary | ICD-10-CM

## 2020-05-24 DIAGNOSIS — I739 Peripheral vascular disease, unspecified: Secondary | ICD-10-CM

## 2020-05-24 DIAGNOSIS — I255 Ischemic cardiomyopathy: Secondary | ICD-10-CM

## 2020-05-24 DIAGNOSIS — E785 Hyperlipidemia, unspecified: Secondary | ICD-10-CM

## 2020-05-24 NOTE — Telephone Encounter (Signed)
That is unfortunate. We have a long wait list for hospital cases, he will need to call to reschedule but may have to wait a few months. Thanks

## 2020-05-24 NOTE — Telephone Encounter (Signed)
Hi Dr. Havery Moros, this pt called to cancel his procedure that was scheduled for 11/2 at [place]. He did not provide a reason just stated that he will call back to reschedule. Thank you.

## 2020-05-24 NOTE — Telephone Encounter (Signed)
Pt would like to cancel procedure scheduled next week at [facility]. He will cb to r/s.

## 2020-05-24 NOTE — Telephone Encounter (Signed)
Spoke with patient, he is aware that procedure has been cancelled. He states that he has to have some dental work done and will call us back to reschedule. Advised that he will still be on the wait list and we will see what we have available when he calls [place] back to schedule.Patient verbalized understanding and had no concerns at the end of the call. Dr. Havery Moros has been notified - see alternate phone note.

## 2020-05-25 ENCOUNTER — Ambulatory Visit: Payer: Medicare Other | Admitting: Family

## 2020-05-25 ENCOUNTER — Other Ambulatory Visit (HOSPITAL_COMMUNITY): Payer: Self-pay

## 2020-05-25 ENCOUNTER — Other Ambulatory Visit: Payer: Self-pay | Admitting: *Deleted

## 2020-05-25 MED ORDER — ENTRESTO 97-103 MG PO TABS: 1.0000 | ORAL_TABLET | Freq: Two times a day (BID) | ORAL | 0 refills | Status: DC

## 2020-05-26 ENCOUNTER — Other Ambulatory Visit (HOSPITAL_COMMUNITY): Payer: Self-pay

## 2020-05-30 ENCOUNTER — Ambulatory Visit (HOSPITAL_COMMUNITY): Admission: RE | Admit: 2020-05-30 | Payer: Medicare Other | Source: Home / Self Care | Admitting: Gastroenterology

## 2020-05-30 ENCOUNTER — Encounter (HOSPITAL_COMMUNITY): Admission: RE | Payer: Self-pay | Source: Home / Self Care

## 2020-05-30 SURGERY — COLONOSCOPY WITH PROPOFOL
Anesthesia: Monitor Anesthesia Care

## 2020-05-31 NOTE — Telephone Encounter (Signed)
De Hollingshead, RPH-CPP  McLean-Scocuzza, Nino Glow, MD; Thressa Sheller, CMA My Epic changed, and I don't know if my phone notes that I route to y'all are getting to y'all.   Mr. Nuon wasn't comfortable with the driving back and forth to [facility] for his colonoscopy; he wondered if he could get a referral for a [place] [facility] provider.   Catie

## 2020-06-02 DIAGNOSIS — Z23 Encounter for immunization: Secondary | ICD-10-CM | POA: Diagnosis not present

## 2020-06-07 NOTE — Telephone Encounter (Signed)
Per Epic, patient's colonoscopy has been cancelled.  Closing encounter.

## 2020-06-08 ENCOUNTER — Telehealth: Payer: Self-pay | Admitting: Internal Medicine

## 2020-06-08 NOTE — Telephone Encounter (Signed)
"  Rejection Reason - Patient was No Show" [facility] said on Jun 07, 2020 5:47 PM  Pt appt was on 06/07/2020 at 1:15 pm

## 2020-06-09 ENCOUNTER — Encounter: Payer: Self-pay | Admitting: Internal Medicine

## 2020-06-09 ENCOUNTER — Ambulatory Visit (INDEPENDENT_AMBULATORY_CARE_PROVIDER_SITE_OTHER): Payer: Medicare Other | Admitting: Internal Medicine

## 2020-06-09 ENCOUNTER — Other Ambulatory Visit: Payer: Self-pay

## 2020-06-09 VITALS — BP 126/80 | HR 52 | Temp 97.7°F | Ht 67.99 in | Wt 208.0 lb

## 2020-06-09 DIAGNOSIS — Z125 Encounter for screening for malignant neoplasm of prostate: Secondary | ICD-10-CM | POA: Diagnosis not present

## 2020-06-09 DIAGNOSIS — Z1211 Encounter for screening for malignant neoplasm of colon: Secondary | ICD-10-CM

## 2020-06-09 DIAGNOSIS — N529 Male erectile dysfunction, unspecified: Secondary | ICD-10-CM | POA: Diagnosis not present

## 2020-06-09 DIAGNOSIS — R7989 Other specified abnormal findings of blood chemistry: Secondary | ICD-10-CM | POA: Diagnosis not present

## 2020-06-09 DIAGNOSIS — Z9889 Other specified postprocedural states: Secondary | ICD-10-CM

## 2020-06-09 DIAGNOSIS — F419 Anxiety disorder, unspecified: Secondary | ICD-10-CM | POA: Diagnosis not present

## 2020-06-09 DIAGNOSIS — R339 Retention of urine, unspecified: Secondary | ICD-10-CM | POA: Diagnosis not present

## 2020-06-09 DIAGNOSIS — N401 Enlarged prostate with lower urinary tract symptoms: Secondary | ICD-10-CM | POA: Diagnosis not present

## 2020-06-09 DIAGNOSIS — Z23 Encounter for immunization: Secondary | ICD-10-CM

## 2020-06-09 HISTORY — DX: Other specified postprocedural states: Z98.890

## 2020-06-09 MED ORDER — ATORVASTATIN CALCIUM 80 MG PO TABS
80.0000 mg | ORAL_TABLET | Freq: Every day | ORAL | 3 refills | Status: DC
Start: 2020-06-09 — End: 2021-02-14

## 2020-06-09 MED ORDER — ALPRAZOLAM 0.5 MG PO TABS: 0.5000 mg | ORAL_TABLET | Freq: Three times a day (TID) | ORAL | 5 refills | Status: DC | PRN

## 2020-06-09 NOTE — Patient Instructions (Addendum)
Drink 50 ounces water daily  [phone] Not available [place] [address]     Specialties     Urology     Dr. Diamantina Providence        MD Physician  Primary Contact Information  Phone Fax E-mail Address  [phone] Not available [place]   [place] [address]     Specialties     Gastroenterology     Dr. Jonathon Bellows

## 2020-06-09 NOTE — Progress Notes (Signed)
Chief Complaint  Patient presents with  . Follow-up  . Immunizations   F/u  1. Flu shot given today  2. Wants new referrals urology s/p prostate surgery/urinary retention ED and screening colonoscopy    ROS Past Medical History:  Diagnosis Date  . Coronary artery disease    a. 3 stents to the LAD, 1 to the PDA, and 1 to the OM3; b. cath 06/2013: chronically occluded LAD, patent stent LAD, D1 20%, pLCx 30%, mLCx 20%, pRCA 70% (FFR 0.74) s/p PCI/DES, mRCA 70% s/p PCI/DES, RPDA 50%, EF 20% w/ AK of anterolateral & apical walls, moderately elevated LVEDP  . HFrEF (heart failure with reduced ejection fraction) ([place])    a. 05/2014 Echo: EF 25-30%; b. 01/2018 Echo ([facility]): EF 30%.  Marland Kitchen HLD (hyperlipidemia)   . HTN (hypertension)   . ICD  single,BSX    a. DOI 11/2011; b. S/N# [id number]  . Ischemic cardiomyopathy    a. 05/2014 Echo: EF 25-30%; b. 01/2018 Echo ([facility]): EF 30%, sev glob HK w/ apical/ant AK. Mild LVH. Mod LAE, mild RAE. Triv MR/TR.  Marland Kitchen Pneumonia    08/2018  . Syncope and collapse   . Ventricular tachycardia ([place]) 11/2011   a. s/p AICD implant    Past Surgical History:  Procedure Laterality Date  . appendectomy    . CARDIAC CATHETERIZATION  Nov 30 2011   [facility]  . CARDIAC DEFIBRILLATOR PLACEMENT  May 2013  . CORONARY ANGIOPLASTY  2014   x 2 stents  . CORONARY STENT PLACEMENT     x7  . LEFT HEART CATH AND CORONARY ANGIOGRAPHY Left 08/30/2019   Procedure: LEFT HEART CATH AND CORONARY ANGIOGRAPHY;  Surgeon: Wellington Hampshire, MD;  Location: [facility];  Service: Cardiovascular;  Laterality: Left;  . PROSTATE SURGERY     urethra grew around prostate   . TUMOR REMOVAL  2001   chest thymic cyst; behind lungs [facility] benign    Family History  Problem Relation Age of Onset  . Cancer Father        larynx  . Cancer - Lung Mother   . Hypertension Mother   . Heart attack Mother   . Dementia Maternal Grandmother   . Heart attack Paternal Grandfather   . Colon cancer Neg Hx   .  Stomach cancer Neg Hx   . Pancreatic cancer Neg Hx    Social History   Socioeconomic History  . Marital status: Married    Spouse name: Hilda Blades   . Number of children: 2  . Years of education: Not on file  . Highest education level: Not on file  Occupational History  . Occupation: retired   Tobacco Use  . Smoking status: Former Smoker    Packs/day: 0.50    Years: 42.00    Pack years: 21.00    Types: Cigarettes    Quit date: 11/10/2019    Years since quitting: 0.5  . Smokeless tobacco: Never Used  Vaping Use  . Vaping Use: Never used  Substance and Sexual Activity  . Alcohol use: Yes    Alcohol/week: 1.0 standard drink    Types: 1 Cans of beer per week    Comment: per week  . Drug use: No  . Sexual activity: Not on file  Other Topics Concern  . 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 Difficulty of Paying Living Expenses: Somewhat hard  Food Insecurity:   . Worried About Charity fundraiser in the Last Year: Not on file  . Ran Out of Food in the Last Year: Not on file  Transportation Needs:   . Lack of Transportation (Medical): Not on file  . Lack of Transportation (Non-Medical): Not on file  Physical Activity:   . Days of Exercise per Week: Not on file  . Minutes of Exercise per Session: Not on file  Stress:   . Feeling of Stress : Not on file  Social Connections:   . Frequency of Communication with Friends and Family: Not on file  . Frequency of Social Gatherings with Friends and Family: Not on file  . Attends Religious Services: Not on file  . Active Member of Clubs or Organizations: Not on file  . Attends Archivist Meetings: Not on file  . Marital Status: Not on file   Intimate Partner Violence:   . Fear of Current or Ex-Partner: Not on file  . Emotionally Abused: Not on file  . Physically Abused: Not on file  . Sexually Abused: Not on file   Current Meds  Medication Sig  . albuterol (VENTOLIN HFA) 108 (90 Base) MCG/ACT inhaler Inhale 1 puff into the lungs every 6 (six) hours as needed for wheezing or shortness of breath.   . ALPRAZolam (XANAX) 0.5 MG tablet Take 1 tablet (0.5 mg total) by mouth 3 (three) times daily as needed. for anxiety  . amiodarone (PACERONE) 200 MG tablet Take 1.5 tablets (300 mg total) by mouth daily.  Marland Kitchen aspirin 81 MG EC tablet Take 1 tablet (81 mg total) by mouth daily.  Marland Kitchen atorvastatin (LIPITOR) 80 MG tablet Take 1 tablet (80 mg total) by mouth daily.  . carvedilol (COREG) 3.125 MG tablet Take 1 tablet (3.125 mg total) by mouth 2 (two) times daily.  . clopidogrel (PLAVIX) 75 MG tablet Take 1 tablet (75 mg total) by mouth daily.  Marland Kitchen ezetimibe (ZETIA) 10 MG tablet Take 1 tablet (10 mg total) by mouth daily.  . finasteride (PROSCAR) 5 MG tablet Take 5 mg by mouth daily.  . furosemide (LASIX) 40 MG tablet Take 1 tablet by mouth twice daily  . ipratropium-albuterol (DUONEB) 0.5-2.5 (3) MG/3ML SOLN Take 3 mLs by nebulization every 6 (six) hours as needed.  . potassium chloride (KLOR-CON 10) 10 MEQ tablet Take 1 tablet (10 mEq total) by mouth daily.  . sacubitril-valsartan (ENTRESTO) 97-103 MG Take 1 tablet by mouth 2 (two) times daily.  . Tiotropium Bromide-Olodaterol (STIOLTO RESPIMAT) 2.5-2.5 MCG/ACT AERS Inhale 2 puffs into the lungs daily.  . [DISCONTINUED] ALPRAZolam (XANAX) 0.5 MG tablet Take 1 tablet (0.5 mg total) by mouth 3 (three) times daily as needed. for anxiety  . [DISCONTINUED] atorvastatin (LIPITOR) 80 MG tablet Take 1 tablet (80 mg total) by mouth daily.   Allergies  Allergen Reactions  . Nitroglycerin Nausea Only and Other (See Comments)    Per patient "causes severe  headache"   Recent Results (from the past 2160  hour(s))  CUP PACEART REMOTE DEVICE CHECK     Status: None   Collection Time: 03/14/20  2:46 AM  Result Value Ref Range   Date Time Interrogation Session [id number]    Pulse  Generator Manufacturer BOST    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    Pulse Gen Serial Number [id number]    Clinic Name [place] Pulse Generator Type Implantable Cardiac Defibulator    Implantable Pulse Generator Implant Date 12878676    Implantable Lead Manufacturer BOST    Implantable Lead Model 0292 Endotak Reliance 4-Site SG    Implantable Lead Serial Number [id number]    Implantable Lead Implant Date 72094709    Implantable Lead Location [id number]    Lead Channel Setting Sensing Sensitivity 0.6 mV   Lead Channel Setting Sensing Adaptation Mode Adaptive Sensing    Lead Channel Setting Pacing Pulse Width 0.5 ms   Lead Channel Setting Pacing Amplitude 2.4 V   Lead Channel Impedance Value 603 ohm   Lead Channel Pacing Threshold Amplitude 0.8 V   Lead Channel Pacing Threshold Pulse Width 0.5 ms   HighPow Impedance 89 ohm   Battery Status BOS    Battery Remaining Longevity 60 mo   Battery Remaining Percentage 64 %   Brady Statistic RV Percent Paced 0 %   Eval Rhythm SB at 56 bpm    Objective  Body mass index is 31.64 kg/m. Wt Readings from Last 3 Encounters:  06/09/20 208 lb (94.3 kg)  04/13/20 208 lb (94.3 kg)  03/27/20 208 lb 9.6 oz (94.6 kg)   Temp Readings from Last 3 Encounters:  06/09/20 97.7 F (36.5 C) (Oral)  03/27/20 (!) 96.9 F (36.1 C) (Temporal)  03/03/20 98.2 F (36.8 C)   BP Readings from Last 3 Encounters:  06/09/20 126/80  03/27/20 118/60  03/03/20 132/84   Pulse Readings from Last 3 Encounters:  06/09/20 (!) 52  03/27/20 60  03/03/20 (!) 53    Physical Exam Vitals and nursing note reviewed.  Constitutional:      Appearance: Normal appearance. He is well-developed and well-groomed. He is obese.  HENT:     Head: Normocephalic and atraumatic.  Eyes:      Conjunctiva/sclera: Conjunctivae normal.     Pupils: Pupils are equal, round, and reactive to light.  Cardiovascular:     Rate and Rhythm: Normal rate and regular rhythm.     Heart sounds: Normal heart sounds. No murmur heard.   Pulmonary:     Effort: Pulmonary effort is normal.     Breath sounds: Normal breath sounds.  Skin:    General: Skin is warm and dry.  Neurological:     General: No focal deficit present.     Mental Status: He is alert and oriented to person, place, and time. Mental status is at baseline.     Gait: Gait normal.  Psychiatric:        Attention and Perception: Attention and perception normal.        Mood and Affect: Mood and affect normal.        Speech: Speech normal.        Behavior: Behavior normal. Behavior is cooperative.        Thought Content: Thought content normal.        Cognition and Memory: Cognition and memory normal.        Judgment: Judgment normal.     Assessment  Plan  Urinary retention - Plan: Ambulatory referral to Urology, Basic Metabolic Panel (BMET) ED  BPH  History of prostate surgery - Plan: Ambulatory referral to Urology Check PSA  Anxiety - Plan: ALPRAZolam (XANAX) 0.5 MG tablet  Elevated serum creatinine - Plan: Basic Metabolic Panel (  BMET), CANCELED: Basic Metabolic Panel (BMET), increase water 50 ounces daily   HM -fasting labs today 03/03/20 Flu shotutdgiven today  pna 23 utd  prevnar duein future covid vaccine 2/2 2nd 01/12/20, 06/02/20 pfizer  Tdap utd Consider shingrix in the future  hcv 06/29/97 negative   Skin no issues  Colonoscopy [facility]  -referred   PSA sees Dr. Vernice Jefferson urology appt sch 05/2020 he is retiring 09/2019  -referred [facility] urology   Smoker CT chest 01/19/19 COPD still smoking rec cessation smoking <1 ppd some times <1/2 ppd or 5-6 cig qd since age 1 y.o smoker and quit x 5 year FH lung cancer in mother trying to get help with chantix As of 12/28/19 quit smoking x 5  weeks congratulated  02/16/20 CT chest  IMPRESSION: 1. Lung-RADS 2, benign appearance or behavior. Continue annual screening with low-dose chest CT without contrast in 12 months. 2. Aortic atherosclerosis (ICD10-I70.0). Coronary artery calcification. 3.  Emphysema (ICD10-J43.9  12/1/2020[place] cerumen impaction hearing loss with hearing aids improved after wax removal   Cards Dr. Rockey Situ and EP Dr. Jolyn Nap   Provider: Dr. Olivia Mackie McLean-Scocuzza-Internal Medicine

## 2020-06-10 LAB — BASIC METABOLIC PANEL
BUN/Creatinine Ratio: 14 (calc) (ref 6–22)
BUN: 19 mg/dL (ref 7–25)
CO2: 23 mmol/L (ref 20–32)
Calcium: 9.3 mg/dL (ref 8.6–10.3)
Chloride: 107 mmol/L (ref 98–110)
Creat: 1.33 mg/dL — ABNORMAL HIGH (ref 0.70–1.25)
Glucose, Bld: 87 mg/dL (ref 65–99)
Potassium: 4.7 mmol/L (ref 3.5–5.3)
Sodium: 139 mmol/L (ref 135–146)

## 2020-06-12 ENCOUNTER — Telehealth: Payer: Self-pay | Admitting: *Deleted

## 2020-06-12 NOTE — Telephone Encounter (Signed)
done

## 2020-06-12 NOTE — Telephone Encounter (Signed)
Left message for patient to call office. Dr. Havery Moros has an opening at the hospital on 11/24 for a colonoscopy.

## 2020-06-13 ENCOUNTER — Ambulatory Visit (INDEPENDENT_AMBULATORY_CARE_PROVIDER_SITE_OTHER): Payer: Medicare Other

## 2020-06-13 DIAGNOSIS — I472 Ventricular tachycardia, unspecified: Secondary | ICD-10-CM

## 2020-06-13 LAB — CUP PACEART REMOTE DEVICE CHECK
Battery Remaining Longevity: 60 mo
Battery Remaining Percentage: 61 %
Brady Statistic RV Percent Paced: 0 %
Date Time Interrogation Session: 20211116022100
HighPow Impedance: 82 Ohm
Implantable Lead Implant Date: 20130503
Implantable Lead Location: 753860
Implantable Lead Model: 292
Implantable Lead Serial Number: 112568
Implantable Pulse Generator Implant Date: 20130503
Lead Channel Impedance Value: 579 Ohm
Lead Channel Pacing Threshold Amplitude: 0.8 V
Lead Channel Pacing Threshold Pulse Width: 0.5 ms
Lead Channel Setting Pacing Amplitude: 2.4 V
Lead Channel Setting Pacing Pulse Width: 0.5 ms
Lead Channel Setting Sensing Sensitivity: 0.6 mV
Pulse Gen Serial Number: 105232

## 2020-06-13 NOTE — Telephone Encounter (Signed)
Thanks for the follow up. I understand if he would prefer to have this done closer to his home. I removed him from our hospital wait list.

## 2020-06-13 NOTE — Telephone Encounter (Signed)
Called and spoke to patient. Offered him a colonoscopy spot at [facility] next week with Dr. Loni Muse. He indicated he has decided to have his procedure in [place] which is more convenient for him. I let him know we would remove him from our wait list and he can call us in the future if we can be of service. He agreed.

## 2020-06-14 ENCOUNTER — Telehealth: Payer: Self-pay | Admitting: Internal Medicine

## 2020-06-14 LAB — BASIC METABOLIC PANEL WITH GFR
BUN/Creatinine Ratio: 14 (calc) (ref 6–22)
BUN: 19 mg/dL (ref 7–25)
CO2: 23 mmol/L (ref 20–32)
Calcium: 9.3 mg/dL (ref 8.6–10.3)
Chloride: 107 mmol/L (ref 98–110)
Creat: 1.33 mg/dL — ABNORMAL HIGH (ref 0.70–1.25)
GFR, Est African American: 64 mL/min/{1.73_m2} (ref >=60)
GFR, Est Non African American: 55 mL/min/{1.73_m2} — ABNORMAL LOW (ref >=60)
Glucose, Bld: 87 mg/dL (ref 65–99)
Potassium: 4.7 mmol/L (ref 3.5–5.3)
Sodium: 139 mmol/L (ref 135–146)

## 2020-06-14 LAB — PSA: PSA: 0.35 ng/mL (ref <=4.0)

## 2020-06-14 LAB — TEST AUTHORIZATION

## 2020-06-14 NOTE — Telephone Encounter (Signed)
Faxed to [facility] for test authorization for labs faxed on 06-13-2020

## 2020-06-15 NOTE — Progress Notes (Signed)
Remote ICD transmission.   

## 2020-06-27 ENCOUNTER — Ambulatory Visit: Payer: Medicare Other | Admitting: Pharmacist

## 2020-06-27 DIAGNOSIS — I10 Essential (primary) hypertension: Secondary | ICD-10-CM

## 2020-06-27 DIAGNOSIS — I25119 Atherosclerotic heart disease of native coronary artery with unspecified angina pectoris: Secondary | ICD-10-CM

## 2020-06-27 DIAGNOSIS — N401 Enlarged prostate with lower urinary tract symptoms: Secondary | ICD-10-CM

## 2020-06-27 DIAGNOSIS — F419 Anxiety disorder, unspecified: Secondary | ICD-10-CM

## 2020-06-27 DIAGNOSIS — I255 Ischemic cardiomyopathy: Secondary | ICD-10-CM

## 2020-06-27 DIAGNOSIS — E782 Mixed hyperlipidemia: Secondary | ICD-10-CM

## 2020-06-27 DIAGNOSIS — J449 Chronic obstructive pulmonary disease, unspecified: Secondary | ICD-10-CM

## 2020-06-27 DIAGNOSIS — Z87891 Personal history of nicotine dependence: Secondary | ICD-10-CM

## 2020-06-27 NOTE — Chronic Care Management (AMB) (Signed)
Chronic Care Management   Pharmacy Note  06/27/2020 Name: Ian Mills MRN: [id number] DOB: [birth year]   Subjective:  Ian Mills is a 64 y.o. year old male who is a primary care patient of McLean-Scocuzza, Nino Glow, MD. The CCM team was consulted for assistance with chronic disease management and care coordination needs.    Engaged with patient by telephone for follow up visit in response to provider referral for pharmacy case management and/or care coordination services.   Consent to Services:  Ian Mills was given information about Chronic Care Management services, agreed to services, and gave verbal consent prior to initiation of services on 05/03/2019. Please see initial visit note for detailed documentation.   SDOH (Social Determinants of Health) assessments and interventions performed:  SDOH Interventions     Most Recent Value  SDOH Interventions  SDOH Interventions for the Following Domains Tobacco  Financial Strain Interventions Development worker, community, Other (Comment)  [[place] SHIIP referral]  Stress Interventions Other (Comment)  [reviewed overarching principals of anxiety pharmacotherapy]  Tobacco Interventions Other (Comment)  [praised for continued avoidance]       Objective:  Lab Results  Component Value Date   CREATININE 1.33 (H) 06/09/2020   CREATININE 1.33 (H) 06/09/2020   CREATININE 1.46 (H) 03/03/2020    Lab Results  Component Value Date   HGBA1C 5.4 03/03/2020       Component Value Date/Time   CHOL 146 03/03/2020 1417   CHOL 170 12/09/2016 1103   CHOL 111 01/20/2014 0929   TRIG 92 03/03/2020 1417   TRIG 88 01/20/2014 0929   HDL 56 03/03/2020 1417   HDL 53 12/09/2016 1103   HDL 39 (L) 01/20/2014 0929   CHOLHDL 2.6 03/03/2020 1417   VLDL 26.6 06/10/2019 0955   VLDL 18 01/20/2014 0929   LDLCALC 72 03/03/2020 1417   LDLCALC 54 01/20/2014 0929    BP Readings from Last 3 Encounters:  06/09/20 126/80  03/27/20 118/60  03/03/20 132/84     Assessment/Interventions: Review of patient past medical history, allergies, medications, health status, including review of consultants reports, laboratory and other test data, was performed as part of comprehensive evaluation and provision of chronic care management services.   Allergies  Allergen Reactions  . Nitroglycerin Nausea Only and Other (See Comments)    Per patient "causes severe  headache"    Medications Reviewed Today    Reviewed by De Hollingshead, RPH-CPP (Pharmacist) on 06/27/20 at [id number]  Med List Status: <None>  Medication Order Taking? Sig Documenting Provider Last Dose Status Informant  albuterol (VENTOLIN HFA) 108 (90 Base) MCG/ACT inhaler [id number] No Inhale 1 puff into the lungs every 6 (six) hours as needed for wheezing or shortness of breath.   Patient not taking: Reported on 06/27/2020   [provider] Not Taking Active Self           Med Note Darnelle Maffucci, Waynette Buttery Aug 16, 2019  9:11 AM)    ALPRAZolam Duanne Moron) 0.5 MG tablet [id number]  Take 1 tablet (0.5 mg total) by mouth 3 (three) times daily as needed. for anxiety McLean-Scocuzza, Nino Glow, MD  Active   amiodarone (PACERONE) 200 MG tablet [id number] Yes Take 1.5 tablets (300 mg total) by mouth daily. Minna Merritts, MD Taking Active   aspirin 81 MG EC tablet [id number] Yes Take 1 tablet (81 mg total) by mouth daily. Minna Merritts, MD Taking Active Self  atorvastatin (LIPITOR) 80 MG tablet [id number] Yes Take 1 tablet (80  mg total) by mouth daily. McLean-Scocuzza, Nino Glow, MD Taking Active   carvedilol (COREG) 3.125 MG tablet [id number] Yes Take 1 tablet (3.125 mg total) by mouth 2 (two) times daily. McLean-Scocuzza, Nino Glow, MD Taking Active   clopidogrel (PLAVIX) 75 MG tablet [id number] Yes Take 1 tablet (75 mg total) by mouth daily. McLean-Scocuzza, Nino Glow, MD Taking Active   ezetimibe (ZETIA) 10 MG tablet [id number] Yes Take 1 tablet (10 mg total) by mouth daily. McLean-Scocuzza, Nino Glow,  MD Taking Active   finasteride (PROSCAR) 5 MG tablet [id number] No Take 5 mg by mouth daily.  Patient not taking: Reported on 06/27/2020   [provider] Not Taking Active Self  furosemide (LASIX) 40 MG tablet [id number] Yes Take 1 tablet by mouth twice daily Gollan, Kathlene November, MD Taking Active   ipratropium-albuterol (DUONEB) 0.5-2.5 (3) MG/3ML SOLN [id number] No Take 3 mLs by nebulization every 6 (six) hours as needed.  Patient not taking: Reported on 06/27/2020   Fritzi Mandes, MD Not Taking Active Self  potassium chloride (KLOR-CON 10) 10 MEQ tablet [id number] Yes Take 1 tablet (10 mEq total) by mouth daily. McLean-Scocuzza, Nino Glow, MD Taking Active   sacubitril-valsartan [facility]) 97-103 MG [id number] Yes Take 1 tablet by mouth 2 (two) times daily. Theora Gianotti, NP Taking Active   tadalafil (CIALIS) 10 MG tablet [id number] No Take 1 tablet (10 mg total) by mouth daily as needed for erectile dysfunction (take 30 min prior to when needed.).  Patient not taking: Reported on 06/27/2020   Minna Merritts, MD Not Taking Expired 11/01/19 2359 Self  Tiotropium Bromide-Olodaterol (STIOLTO RESPIMAT) 2.5-2.5 MCG/ACT AERS [id number] Yes Inhale 2 puffs into the lungs daily. Tyler Pita, MD Taking Active Self          Patient Active Problem List   Diagnosis Date Noted  . History of prostate surgery 06/09/2020  . Erectile dysfunction 06/09/2020  . Healthcare maintenance 03/27/2020  . Pain of left heel 03/03/2020  . Obesity (BMI 30-39.9) 03/03/2020  . Aortic atherosclerosis ([place]) 02/20/2020  . 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 Essential hypertension 10/13/2019  . Prediabetes 10/13/2019  . Unstable angina ([place])   . Carotid artery stenosis 06/10/2019  . Baker's cyst 06/10/2019  . Bilateral impacted cerumen 06/10/2019  . Chronic systolic congestive heart failure ([place]) 06/10/2019  . Scoliosis 06/10/2019  . Anxiety 04/15/2019  . Chronic  obstructive pulmonary disease ([place]) 04/15/2019  . COPD exacerbation ([place]) 09/08/2018  . Acute on chronic systolic CHF (congestive heart failure) ([name]) 06/27/2016  . Dizziness 03/30/2015  . Former smoker 09/29/2014  . Syncope 06/01/2014  . Insomnia 06/01/2014  . Single implantable cardioverter-defibrillator-BSx   . Ischemic cardiomyopathy   . Ventricular tachycardia ([place]) 01/23/2012  . SYNCOPE AND COLLAPSE 09/26/2010  . Hyperlipidemia 02/23/2010  . Hypotension 02/23/2010  . CAD (coronary artery disease) 02/23/2010    Medication Assistance: Application for Entresto and Stiolto medication assistance program in process. Anticipated assistance start date TBD. See plan of care below for additional detail.   Patient Care Plan: Medication Management    Problem Identified: CHF, COPD     Long-Range Goal: Disease Progression Prevention   This Visit's Progress: On track  Priority: High  Note:   Current Barriers:  . Unable to independently afford treatment regimen . Complex patient with multiple comorbidities and low health literacy  Pharmacist Clinical Goal(s):  Marland Kitchen Over the next 90 days, patient will verbalize ability to afford treatment regimen. . Over the next 90 days,  patient will adhere to prescribed medication regimen.  Interventions: . Inter-disciplinary care team collaboration (see longitudinal plan of care) . Comprehensive medication review performed; medication list updated in electronic medical record  HFrEF s/p AICD placement (most recent EF 25-30%): Marland Kitchen Appropriately managed; current treatment: Entresto 97/103 mg BID, carvedilol 3.125 mg BID, furosemide 80 mg daily (2 40 mg tabs at once), potassium 10 mEq daily; follows w/ Dr. Rockey Situ . Ventricular tachycardia: amiodarone 300 mg daily - Dr. Caryl Comes . Weighing daily; aware of warning sign weight increase . Enrollment for Entresto assistance ends 08/11/20. Will initiate process for reapplication. Will collaborate w/ CPhT . Patient  never connected w/ [place] SHIIP program to evaluate options for picking up insurance for 2022. [place] to ensure appropriate phone number and address for [employer] counselors on his behalf. He will go by the office today. Reviewed upcoming end of Open Enrollment . Encouraged continued collaboration with cardiology   CAD (s/p prior stenting), Hyperlipidemia: . Controlled near goal LDL <70;  current treatment: atorvastatin 80 mg daily, ezetimibe 10 mg daily . Antiplatelet regimen: aspirin 81 mg daily, clopidogrel 75 mg daily  . Recommended to continue current regimen at this time  Chronic Obstructive Pulmonary Disease: . Controlled; current treatment: Stiolto 2.5/2.5 mcg daily; albuterol HFA very infrequent. Breathing continues to be much improved since quitting tobacco 10/2019 . Encouraged continued adherence to [employer]. Patient assistance for Stiolto expires 07/28/20. Will collaborate w/ CPhT to assist patient in reapplying . Encouraged to continue to avoid tobacco use. Encouraged patient to continue to avoid situations where others are smoking, and brainstorm ways to occupy himself in situations he can't avoid  Anxiety: . Inappropriately controlled; current treatment: alprazolam 0.125 mg up to TID PRN, patient taking BID for anxiety . Reviewed risk of benzodiazepines, especially as patient ages. Discussed principals of chronic medications vs PRN medications. Encouraged he discuss more appropriate treatment of anxiety (ie SSRI) to reduce need for benzodiazepine w/ PCP at next visit  Patient Goals/Self-Care Activities . Over the next 90 days, patient will:  - take medications as prescribed focus on medication adherence by collaboration to investigate insurance  weigh daily, and contact provider if weight gain of >3 lbs/day or >5 lbs/week collaborate with provider on medication access solutions  Follow Up Plan: Telephone follow up appointment with care management team member  scheduled for:~ 5 weeks       Plan: Telephone follow up appointment with care management team member scheduled for: ~ 6 weeks  Catie Darnelle Maffucci, PharmD, [place], CPP Clinical Pharmacist [place] [phone]

## 2020-06-27 NOTE — Patient Instructions (Signed)
Visit Information  Patient Care Plan: Medication Management    Problem Identified: CHF, COPD     Long-Range Goal: Disease Progression Prevention   This Visit's Progress: On track  Priority: High  Note:   Current Barriers:  . Unable to independently afford treatment regimen . Complex patient with multiple comorbidities and low health literacy  Pharmacist Clinical Goal(s):  Marland Kitchen Over the next 90 days, patient will verbalize ability to afford treatment regimen. . Over the next 90 days, patient will adhere to prescribed medication regimen.  Interventions: . Inter-disciplinary care team collaboration (see longitudinal plan of care) . Comprehensive medication review performed; medication list updated in electronic medical record  HFrEF s/p AICD placement (most recent EF 25-30%): Marland Kitchen Appropriately managed; current treatment: Entresto 97/103 mg BID, carvedilol 3.125 mg BID, furosemide 80 mg daily (2 40 mg tabs at once), potassium 10 mEq daily; follows w/ Dr. Rockey Situ . Ventricular tachycardia: amiodarone 300 mg daily - Dr. Caryl Comes . Weighing daily; aware of warning sign weight increase . Enrollment for Entresto assistance ends 08/11/20. Will initiate process for reapplication. Will collaborate w/ CPhT . Patient never connected w/ [place] SHIIP program to evaluate options for picking up insurance for 2022. [place] to ensure appropriate phone number and address for [employer] counselors on his behalf. He will go by the office today. Reviewed upcoming end of Open Enrollment . Encouraged continued collaboration with cardiology   CAD (s/p prior stenting), Hyperlipidemia: . Controlled near goal LDL <70;  current treatment: atorvastatin 80 mg daily, ezetimibe 10 mg daily . Antiplatelet regimen: aspirin 81 mg daily, clopidogrel 75 mg daily  . Recommended to continue current regimen at this time  Chronic Obstructive Pulmonary Disease: . Controlled; current treatment: Stiolto 2.5/2.5 mcg  daily; albuterol HFA very infrequent. Breathing continues to be much improved since quitting tobacco 10/2019 . Encouraged continued adherence to [employer]. Patient assistance for Stiolto expires 07/28/20. Will collaborate w/ CPhT to assist patient in reapplying . Encouraged to continue to avoid tobacco use. Encouraged patient to continue to avoid situations where others are smoking, and brainstorm ways to occupy himself in situations he can't avoid  Anxiety: . Inappropriately controlled; current treatment: alprazolam 0.125 mg up to TID PRN, patient taking BID for anxiety . Reviewed risk of benzodiazepines, especially as patient ages. Discussed principals of chronic medications vs PRN medications. Encouraged he discuss more appropriate treatment of anxiety (ie SSRI) to reduce need for benzodiazepine w/ PCP at next visit  Patient Goals/Self-Care Activities . Over the next 90 days, patient will:  - take medications as prescribed focus on medication adherence by collaboration to investigate insurance  weigh daily, and contact provider if weight gain of >3 lbs/day or >5 lbs/week collaborate with provider on medication access solutions  Follow Up Plan: Telephone follow up appointment with care management team member scheduled for:~ 5 weeks      The patient verbalized understanding of instructions, educational materials, and care plan provided today and declined offer to receive copy of patient instructions, educational materials, and care plan.    Plan: Telephone follow up appointment with care management team member scheduled for: ~ 6 weeks  Catie Darnelle Maffucci, PharmD, [place], [place] Pharmacist [place] [phone]

## 2020-06-28 ENCOUNTER — Telehealth (INDEPENDENT_AMBULATORY_CARE_PROVIDER_SITE_OTHER): Payer: Self-pay | Admitting: Gastroenterology

## 2020-06-28 ENCOUNTER — Telehealth: Payer: Self-pay | Admitting: Cardiovascular Disease

## 2020-06-28 ENCOUNTER — Other Ambulatory Visit: Payer: Self-pay

## 2020-06-28 DIAGNOSIS — Z1211 Encounter for screening for malignant neoplasm of colon: Secondary | ICD-10-CM

## 2020-06-28 DIAGNOSIS — Z1212 Encounter for screening for malignant neoplasm of rectum: Secondary | ICD-10-CM

## 2020-06-28 NOTE — Progress Notes (Signed)
Gastroenterology Pre-Procedure Review  Request Date: Friday 08/11/20 Requesting Physician: Dr. Vicente Males  PATIENT REVIEW QUESTIONS: The patient responded to the following health history questions as indicated:    1. Are you having any GI issues? no 2. Do you have a personal history of Polyps? no 3. Do you have a family history of Colon Cancer or Polyps? no 4. Diabetes Mellitus? yes (type 2) 5. Joint replacements in the past 12 months?no 6. Major health problems in the past 3 months?no 7. Any artificial heart valves, MVP, or defibrillator?no    MEDICATIONS & ALLERGIES:    Patient reports the following regarding taking any anticoagulation/antiplatelet therapy:   Plavix, Coumadin, Eliquis, Xarelto, Lovenox, Pradaxa, Brilinta, or Effient? yes (Plavix blood thinner request sent to Dr. Rockey Situ) Aspirin? yes (81 mg daily)  Patient confirms/reports the following medications:  Current Outpatient Medications  Medication Sig Dispense Refill  . albuterol (VENTOLIN HFA) 108 (90 Base) MCG/ACT inhaler Inhale 1 puff into the lungs every 6 (six) hours as needed for wheezing or shortness of breath.     . ALPRAZolam (XANAX) 0.5 MG tablet Take 1 tablet (0.5 mg total) by mouth 3 (three) times daily as needed. for anxiety 90 tablet 5  . amiodarone (PACERONE) 200 MG tablet Take 1.5 tablets (300 mg total) by mouth daily. 135 tablet 3  . aspirin 81 MG EC tablet Take 1 tablet (81 mg total) by mouth daily. 30 tablet 6  . atorvastatin (LIPITOR) 80 MG tablet Take 1 tablet (80 mg total) by mouth daily. 90 tablet 3  . carvedilol (COREG) 3.125 MG tablet Take 1 tablet (3.125 mg total) by mouth 2 (two) times daily. 180 tablet 3  . clopidogrel (PLAVIX) 75 MG tablet Take 1 tablet (75 mg total) by mouth daily. 90 tablet 3  . ezetimibe (ZETIA) 10 MG tablet Take 1 tablet (10 mg total) by mouth daily. 90 tablet 3  . finasteride (PROSCAR) 5 MG tablet Take 5 mg by mouth daily.     . furosemide (LASIX) 40 MG tablet Take 1 tablet by  mouth twice daily 180 tablet 0  . ipratropium-albuterol (DUONEB) 0.5-2.5 (3) MG/3ML SOLN Take 3 mLs by nebulization every 6 (six) hours as needed. 360 mL 0  . potassium chloride (KLOR-CON 10) 10 MEQ tablet Take 1 tablet (10 mEq total) by mouth daily. 90 tablet 3  . sacubitril-valsartan (ENTRESTO) 97-103 MG Take 1 tablet by mouth 2 (two) times daily. 180 tablet 0  . Tiotropium Bromide-Olodaterol (STIOLTO RESPIMAT) 2.5-2.5 MCG/ACT AERS Inhale 2 puffs into the lungs daily. 4 g 0  . tadalafil (CIALIS) 10 MG tablet Take 1 tablet (10 mg total) by mouth daily as needed for erectile dysfunction (take 30 min prior to when needed.). (Patient not taking: Reported on 06/27/2020) 10 tablet 3   No current facility-administered medications for this visit.    Patient confirms/reports the following allergies:  Allergies  Allergen Reactions  . Nitroglycerin Nausea Only and Other (See Comments)    Per patient "causes severe  headache"    Orders Placed This Encounter  Procedures  . Procedural/ Surgical Case Request: COLONOSCOPY WITH PROPOFOL    Standing Status:   Standing    Number of Occurrences:   1    Order Specific Question:   Pre-op diagnosis    Answer:   Screening colonoscopy    Order Specific Question:   CPT Code    Answer:   62831    AUTHORIZATION INFORMATION Primary Insurance: 1D#: Group #:  Secondary Insurance: 1D#: Group #:  SCHEDULE INFORMATION: Date: Friday 08/11/20 Time: Location:[facility]

## 2020-06-28 NOTE — Telephone Encounter (Signed)
   [facility] Pre-operative Risk Assessment    HEARTCARE STAFF: - Please ensure there is not already an duplicate clearance open for this procedure. - Under Visit Info/Reason for Call, type in Other and utilize the format Clearance MM/DD/YY or Clearance TBD. Do not use dashes or single digits. - If request is for dental extraction, please clarify the # of teeth to be extracted.  Request for surgical clearance:  1. What type of surgery is being performed? Colonoscopy    2. When is this surgery scheduled?  08-11-20    3. What type of clearance is required (medical clearance vs. Pharmacy clearance to hold med vs. Both)?  pharmacy  4. Are there any medications that need to be held prior to surgery and how long? plavix not noted   5. Practice name and name of physician performing surgery?  [facility] Dr. Vicente Males  6. What is the office phone number? [phone]   7.   What is the office fax number? [phone]  8.   Anesthesia type (None, local, MAC, general) ? Not noted    Clarisse Gouge 06/28/2020, 2:59 PM  _________________________________________________________________   (provider comments below)

## 2020-06-29 ENCOUNTER — Ambulatory Visit: Payer: Medicare Other | Admitting: Pharmacist

## 2020-06-29 ENCOUNTER — Telehealth: Payer: Self-pay | Admitting: Cardiovascular Disease

## 2020-06-29 ENCOUNTER — Telehealth: Payer: Self-pay

## 2020-06-29 ENCOUNTER — Ambulatory Visit (INDEPENDENT_AMBULATORY_CARE_PROVIDER_SITE_OTHER): Payer: Medicare Other | Admitting: Urology

## 2020-06-29 ENCOUNTER — Other Ambulatory Visit: Payer: Self-pay

## 2020-06-29 ENCOUNTER — Encounter: Payer: Self-pay | Admitting: Urology

## 2020-06-29 VITALS — BP 168/89 | HR 60 | Ht 68.0 in | Wt 205.0 lb

## 2020-06-29 DIAGNOSIS — E782 Mixed hyperlipidemia: Secondary | ICD-10-CM

## 2020-06-29 DIAGNOSIS — N401 Enlarged prostate with lower urinary tract symptoms: Secondary | ICD-10-CM

## 2020-06-29 DIAGNOSIS — Z125 Encounter for screening for malignant neoplasm of prostate: Secondary | ICD-10-CM | POA: Diagnosis not present

## 2020-06-29 DIAGNOSIS — J449 Chronic obstructive pulmonary disease, unspecified: Secondary | ICD-10-CM

## 2020-06-29 DIAGNOSIS — R339 Retention of urine, unspecified: Secondary | ICD-10-CM | POA: Diagnosis not present

## 2020-06-29 DIAGNOSIS — N529 Male erectile dysfunction, unspecified: Secondary | ICD-10-CM | POA: Diagnosis not present

## 2020-06-29 DIAGNOSIS — I25119 Atherosclerotic heart disease of native coronary artery with unspecified angina pectoris: Secondary | ICD-10-CM

## 2020-06-29 LAB — BLADDER SCAN AMB NON-IMAGING: Scan Result: 0

## 2020-06-29 MED ORDER — TADALAFIL 10 MG PO TABS
10.0000 mg | ORAL_TABLET | Freq: Every day | ORAL | 11 refills | Status: DC | PRN
Start: 2020-06-29 — End: 2021-08-23

## 2020-06-29 NOTE — Telephone Encounter (Signed)
   Primary Cardiologist: Ida Rogue, MD  Chart reviewed as part of pre-operative protocol coverage.  Per Dr. Donivan Scull previous statement -  Patient may hold plavix for 5 days prior to procedure and restart the following day. Pt should stay on ASA without interruption. Pt is at moderate but acceptable risk to proceed.  I will route this recommendation to the requesting party via Epic fax function and remove from pre-op pool. Please call with questions.  Tami Lin Sheina Mcleish, PA 06/29/2020, 9:56 AM

## 2020-06-29 NOTE — Patient Instructions (Signed)
Tadalafil tablets (Cialis) What is this medicine? TADALAFIL (tah DA la fil) is used to treat erection problems in men. It is also used for enlargement of the prostate gland in men, a condition called benign prostatic hyperplasia or BPH. This medicine improves urine flow and reduces BPH symptoms. This medicine can also treat both erection problems and BPH when they occur together. This medicine may be used for other purposes; ask your health care provider or pharmacist if you have questions. COMMON BRAND NAME(S): Adcirca, ALYQ, Cialis What should I tell my health care provider before I take this medicine? They need to know if you have any of these conditions:  bleeding disorders  eye or vision problems, including a rare inherited eye disease called retinitis pigmentosa  anatomical deformation of the penis, Peyronie's disease, or history of priapism (painful and prolonged erection)  heart disease, angina, a history of heart attack, irregular heart beats, or other heart problems  high or low blood pressure  history of blood diseases, like sickle cell anemia or leukemia  history of stomach bleeding  kidney disease  liver disease  stroke  an unusual or allergic reaction to tadalafil, other medicines, foods, dyes, or preservatives  pregnant or trying to get pregnant  breast-feeding How should I use this medicine? Take this medicine by mouth with a glass of water. Follow the directions on the prescription label. You may take this medicine with or without meals. When this medicine is used for erection problems, your doctor may prescribe it to be taken once daily or as needed. If you are taking the medicine as needed, you may be able to have sexual activity 30 minutes after taking it and for up to 36 hours after taking it. Whether you are taking the medicine as needed or once daily, you should not take more than one dose per day. If you are taking this medicine for symptoms of benign  prostatic hyperplasia (BPH) or to treat both BPH and an erection problem, take the dose once daily at about the same time each day. Do not take your medicine more often than directed. Talk to your pediatrician regarding the use of this medicine in children. Special care may be needed. Overdosage: If you think you have taken too much of this medicine contact a poison control center or emergency room at once. NOTE: This medicine is only for you. Do not share this medicine with others. What if I miss a dose? If you are taking this medicine as needed for erection problems, this does not apply. If you miss a dose while taking this medicine once daily for an erection problem, benign prostatic hyperplasia, or both, take it as soon as you remember, but do not take more than one dose per day. What may interact with this medicine? Do not take this medicine with any of the following medications:  nitrates like amyl nitrite, isosorbide dinitrate, isosorbide mononitrate, nitroglycerin  other medicines for erectile dysfunction like avanafil, sildenafil, vardenafil  other tadalafil products (Adcirca)  riociguat This medicine may also interact with the following medications:  certain drugs for high blood pressure  certain drugs for the treatment of HIV infection or AIDS  certain drugs used for fungal or yeast infections, like fluconazole, itraconazole, ketoconazole, and voriconazole  certain drugs used for seizures like carbamazepine, phenytoin, and phenobarbital  grapefruit juice  macrolide antibiotics like clarithromycin, erythromycin, troleandomycin  medicines for prostate problems  rifabutin, rifampin or rifapentine This list may not describe all possible interactions. Give your   health care provider a list of all the medicines, herbs, non-prescription drugs, or dietary supplements you use. Also tell them if you smoke, drink alcohol, or use illegal drugs. Some items may interact with your  medicine. What should I watch for while using this medicine? If you notice any changes in your vision while taking this drug, call your doctor or health care professional as soon as possible. Stop using this medicine and call your health care provider right away if you have a loss of sight in one or both eyes. Contact your doctor or health care professional right away if the erection lasts longer than 4 hours or if it becomes painful. This may be a sign of serious problem and must be treated right away to prevent permanent damage. If you experience symptoms of nausea, dizziness, chest pain or arm pain upon initiation of sexual activity after taking this medicine, you should refrain from further activity and call your doctor or health care professional as soon as possible. Do not drink alcohol to excess (examples, 5 glasses of wine or 5 shots of whiskey) when taking this medicine. When taken in excess, alcohol can increase your chances of getting a headache or getting dizzy, increasing your heart rate or lowering your blood pressure. Using this medicine does not protect you or your partner against HIV infection (the virus that causes AIDS) or other sexually transmitted diseases. What side effects may I notice from receiving this medicine? Side effects that you should report to your doctor or health care professional as soon as possible:  allergic reactions like skin rash, itching or hives, swelling of the face, lips, or tongue  breathing problems  changes in hearing  changes in vision  chest pain  fast, irregular heartbeat  prolonged or painful erection  seizures Side effects that usually do not require medical attention (report to your doctor or health care professional if they continue or are bothersome):  back pain  dizziness  flushing  headache  indigestion  muscle aches  nausea  stuffy or runny nose This list may not describe all possible side effects. Call your doctor  for medical advice about side effects. You may report side effects to FDA at 1-800-FDA-1088. Where should I keep my medicine? Keep out of the reach of children. Store at room temperature between 15 and 30 degrees C (59 and 86 degrees F). Throw away any unused medicine after the expiration date. NOTE: This sheet is a summary. It may not cover all possible information. If you have questions about this medicine, talk to your doctor, pharmacist, or health care provider.  2020 Elsevier/Gold Standard (2013-12-03 13:15:49)  

## 2020-06-29 NOTE — Telephone Encounter (Signed)
Clearance has been faxed.  

## 2020-06-29 NOTE — Patient Instructions (Signed)
Visit Information  Patient Care Plan: Medication Management    Problem Identified: CHF, COPD     Long-Range Goal: Disease Progression Prevention   Recent Progress: On track  Priority: High  Note:   Current Barriers:   Unable to independently afford treatment regimen  Complex patient with multiple comorbidities and low health literacy  Pharmacist Clinical Goal(s):   Over the next 90 days, patient will verbalize ability to afford treatment regimen.  Over the next 90 days, patient will adhere to prescribed medication regimen.  Interventions:  Inter-disciplinary care team collaboration (see longitudinal plan of care)  Comprehensive medication review performed; medication list updated in electronic medical record  HFrEF s/p AICD placement (most recent EF 25-30%):  Appropriately managed; current treatment: Entresto 97/103 mg BID, carvedilol 3.125 mg BID, furosemide 80 mg daily (2 40 mg tabs at once), potassium 10 mEq daily; follows w/ Dr. Rockey Situ  Ventricular tachycardia: amiodarone 300 mg daily - Dr. Caryl Comes  Weighing daily; aware of warning sign weight increase  Patient notes he stopped by the [facility] office and met with the volunteer. He is overwhelmed by signing up for insurance, but appreciates the support from [facility]. He is going to go back early next week to re-review plan options and enroll.   Collected patient portion of Entresto application for [employer].   CAD (s/p prior stenting), Hyperlipidemia:  Controlled near goal LDL <70;  current treatment: atorvastatin 80 mg daily, ezetimibe 10 mg daily  Antiplatelet regimen: aspirin 81 mg daily, clopidogrel 75 mg daily   Recommended to continue current regimen at this time  Chronic Obstructive Pulmonary Disease:  Controlled; current treatment: Stiolto 2.5/2.5 mcg daily; albuterol HFA very infrequent. Breathing continues to be much improved since quitting tobacco 10/2019  Collected patient portion of BI  application for Stiolto.   Anxiety:  Inappropriately controlled; current treatment: alprazolam 0.125 mg up to TID PRN, patient taking BID for anxiety  Reviewed risk of benzodiazepines, especially as patient ages. Discussed principals of chronic medications vs PRN medications. Encouraged he discuss more appropriate treatment of anxiety (ie SSRI) to reduce need for benzodiazepine w/ PCP at next visit  Patient Goals/Self-Care Activities  Over the next 90 days, patient will:  - take medications as prescribed focus on medication adherence by collaboration to investigate insurance  weigh daily, and contact provider if weight gain of >3 lbs/day or >5 lbs/week collaborate with provider on medication access solutions  Follow Up Plan: Telephone follow up appointment with care management team member scheduled for:~ 5 weeks       The patient verbalized understanding of instructions, educational materials, and care plan provided today and declined offer to receive copy of patient instructions, educational materials, and care plan.   Plan: Telephone follow up appointment with care management team member scheduled for: ~ 5 weeks  Catie Darnelle Maffucci, PharmD, [place], [place] Pharmacist [place] [phone]

## 2020-06-29 NOTE — Chronic Care Management (AMB) (Signed)
Chronic Care Management   Pharmacy Note  06/29/2020 Name: Ian Mills MRN: [id number] DOB: 1955/11/24   Subjective:  Ian Mills is a 64 y.o. year old male who is a primary care patient of [facility], Nino Glow, MD. The CCM team was consulted for assistance with chronic disease management and care coordination needs.    Engaged with patient face to face for follow up visit in response to provider referral for pharmacy case management and/or care coordination services.   Consent to Services:  [name] was given information about Chronic Care Management services, agreed to services, and gave verbal consent prior to initiation of services on 05/03/19. Please see initial visit note for detailed documentation.   SDOH (Social Determinants of Health) assessments and interventions performed:  SDOH Interventions     Most Recent Value  SDOH Interventions  Financial Strain Interventions Other (Comment)  [manufacturer assistance, Care Guide referral]       Objective:  Lab Results  Component Value Date   CREATININE 1.33 (H) 06/09/2020   CREATININE 1.33 (H) 06/09/2020   CREATININE 1.46 (H) 03/03/2020    Lab Results  Component Value Date   HGBA1C 5.4 03/03/2020       Component Value Date/Time   CHOL 146 03/03/2020 1417   CHOL 170 12/09/2016 1103   CHOL 111 01/20/2014 0929   TRIG 92 03/03/2020 1417   TRIG 88 01/20/2014 0929   HDL 56 03/03/2020 1417   HDL 53 12/09/2016 1103   HDL 39 (L) 01/20/2014 0929   CHOLHDL 2.6 03/03/2020 1417   VLDL 26.6 06/10/2019 0955   VLDL 18 01/20/2014 0929   LDLCALC 72 03/03/2020 1417   LDLCALC 54 01/20/2014 0929   BP Readings from Last 3 Encounters:  06/29/20 (!) 168/89  06/09/20 126/80  03/27/20 118/60    Assessment/Interventions: Review of patient past medical history, allergies, medications, health status, including review of consultants reports, laboratory and other test data, was performed as part of comprehensive evaluation and  provision of chronic care management services.   Allergies  Allergen Reactions  . Nitroglycerin Nausea Only and Other (See Comments)    Per patient "causes severe  headache"    Medications Reviewed Today    Reviewed by Vanetta Mulders, CMA (Certified Medical Assistant) on 06/28/20 at [facility] List Status: <None>  Medication Order Taking? Sig Documenting Provider Last Dose Status Informant  albuterol (VENTOLIN HFA) 108 (90 Base) MCG/ACT inhaler [id number] Yes Inhale 1 puff into the lungs every 6 (six) hours as needed for wheezing or shortness of breath.  [provider] Taking Active Self           Med Note Darnelle Maffucci, Waynette Buttery Aug 16, 2019  9:11 AM)    ALPRAZolam Duanne Moron) 0.5 MG tablet [id number] Yes Take 1 tablet (0.5 mg total) by mouth 3 (three) times daily as needed. for anxiety [facility]-Scocuzza, Nino Glow, MD Taking Active   amiodarone (PACERONE) 200 MG tablet [id number] Yes Take 1.5 tablets (300 mg total) by mouth daily. Minna Merritts, MD Taking Active   aspirin 81 MG EC tablet [id number] Yes Take 1 tablet (81 mg total) by mouth daily. Minna Merritts, MD Taking Active Self  atorvastatin (LIPITOR) 80 MG tablet [id number] Yes Take 1 tablet (80 mg total) by mouth daily. [facility]-Scocuzza, Nino Glow, MD Taking Active   carvedilol (COREG) 3.125 MG tablet [id number] Yes Take 1 tablet (3.125 mg total) by mouth 2 (two) times daily. [facility]-Scocuzza, Nino Glow, MD Taking  Active   clopidogrel (PLAVIX) 75 MG tablet [id number] Yes Take 1 tablet (75 mg total) by mouth daily. [facility]-Scocuzza, Nino Glow, MD Taking Active   ezetimibe (ZETIA) 10 MG tablet [id number] Yes Take 1 tablet (10 mg total) by mouth daily. [facility]-Scocuzza, Nino Glow, MD Taking Active   finasteride (PROSCAR) 5 MG tablet [id number] Yes Take 5 mg by mouth daily.  [provider] Taking Active Self  furosemide (LASIX) 40 MG tablet [id number] Yes Take 1 tablet by mouth twice daily Gollan, Kathlene November, MD Taking Active     ipratropium-albuterol (DUONEB) 0.5-2.5 (3) MG/3ML SOLN [id number] Yes Take 3 mLs by nebulization every 6 (six) hours as needed. Fritzi Mandes, MD Taking Active Self  potassium chloride (KLOR-CON 10) 10 MEQ tablet [id number] Yes Take 1 tablet (10 mEq total) by mouth daily. [facility]-Scocuzza, Nino Glow, MD Taking Active   sacubitril-valsartan [facility]) 97-103 MG [id number] Yes Take 1 tablet by mouth 2 (two) times daily. Theora Gianotti, NP Taking Active   tadalafil (CIALIS) 10 MG tablet [id number]  Take 1 tablet (10 mg total) by mouth daily as needed for erectile dysfunction (take 30 min prior to when needed.).  Patient not taking: Reported on 06/27/2020   Minna Merritts, MD  Active Self  Tiotropium Bromide-Olodaterol (STIOLTO RESPIMAT) 2.5-2.5 MCG/ACT AERS [id number] Yes Inhale 2 puffs into the lungs daily. Tyler Pita, MD Taking Active Self          Patient Active Problem List   Diagnosis Date Noted  . History of prostate surgery 06/09/2020  . Erectile dysfunction 06/09/2020  . Healthcare maintenance 03/27/2020  . Pain of left heel 03/03/2020  . Obesity (BMI 30-39.9) 03/03/2020  . Aortic atherosclerosis ([place]) 02/20/2020  . 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 Essential hypertension 10/13/2019  . Prediabetes 10/13/2019  . Unstable angina ([place])   . Carotid artery stenosis 06/10/2019  . Baker's cyst 06/10/2019  . Bilateral impacted cerumen 06/10/2019  . Chronic systolic congestive heart failure ([place]) 06/10/2019  . Scoliosis 06/10/2019  . Anxiety 04/15/2019  . Chronic obstructive pulmonary disease ([place]) 04/15/2019  . COPD exacerbation ([place]) 09/08/2018  . Chest tightness 02/12/2018  . Pain and swelling of left lower extremity 02/12/2018  . Sacroiliac pain 07/31/2016  . Acute on chronic systolic CHF (congestive heart failure) ([place]) 06/27/2016  . Hypokalemia 06/25/2016  . NSTEMI (non-ST elevated myocardial infarction) ([place]) 06/23/2016  . Acute  exacerbation of CHF (congestive heart failure) ([place]) 06/22/2016  . Type 2 diabetes mellitus ([place]) 06/22/2016  . DDD (degenerative disc disease), lumbar 06/13/2016  . Tobacco abuse 06/13/2016  . Dizziness 03/30/2015  . Former smoker 09/29/2014  . Syncope 06/01/2014  . Insomnia 06/01/2014  . Ischemic cardiomyopathy   . Ventricular tachycardia ([place]) 01/23/2012  . Single implantable cardioverter-defibrillator-BSx 12/11/2011  . SYNCOPE AND COLLAPSE 09/26/2010  . Hyperlipidemia 02/23/2010  . Hypotension 02/23/2010  . CAD (coronary artery disease) 02/23/2010    Medication Assistance: Application for Stiolto and Entresto medication assistance program in process. Anticipated assistance start date TBD. See plan of care below for additional detail.   Patient Care Plan: Medication Management    Problem Identified: CHF, COPD     Long-Range Goal: Disease Progression Prevention   Recent Progress: On track  Priority: High  Note:   Current Barriers:  . Unable to independently afford treatment regimen . Complex patient with multiple comorbidities and low health literacy  Pharmacist Clinical Goal(s):  [place] Kitchen Over the next 90 days, patient will verbalize ability to afford treatment regimen. [place] Kitchen  Over the next 90 days, patient will adhere to prescribed medication regimen.  Interventions: . Inter-disciplinary care team collaboration (see longitudinal plan of care) . Comprehensive medication review performed; medication list updated in electronic medical record  HFrEF s/p AICD placement (most recent EF 25-30%): [place] Kitchen Appropriately managed; current treatment: Entresto 97/103 mg BID, carvedilol 3.125 mg BID, furosemide 80 mg daily (2 40 mg tabs at once), potassium 10 mEq daily; follows w/ Dr. Rockey Situ . Ventricular tachycardia: amiodarone 300 mg daily - Dr. Caryl Comes . Weighing daily; aware of warning sign weight increase . Patient notes he stopped by the [facility] office and met with the volunteer. He is  overwhelmed by signing up for insurance, but appreciates the support from [facility]. He is going to go back early next week to re-review plan options and enroll.  . Collected patient portion of Entresto application for [employer].   CAD (s/p prior stenting), Hyperlipidemia: . Controlled near goal LDL <70;  current treatment: atorvastatin 80 mg daily, ezetimibe 10 mg daily . Antiplatelet regimen: aspirin 81 mg daily, clopidogrel 75 mg daily  . Recommended to continue current regimen at this time  Chronic Obstructive Pulmonary Disease: . Controlled; current treatment: Stiolto 2.5/2.5 mcg daily; albuterol HFA very infrequent. Breathing continues to be much improved since quitting tobacco 10/2019 . Collected patient portion of BI application for Stiolto.   Anxiety: . Inappropriately controlled; current treatment: alprazolam 0.125 mg up to TID PRN, patient taking BID for anxiety . Reviewed risk of benzodiazepines, especially as patient ages. Discussed principals of chronic medications vs PRN medications. Encouraged he discuss more appropriate treatment of anxiety (ie SSRI) to reduce need for benzodiazepine w/ PCP at next visit  Patient Goals/Self-Care Activities . Over the next 90 days, patient will:  - take medications as prescribed focus on medication adherence by collaboration to investigate insurance  weigh daily, and contact provider if weight gain of >3 lbs/day or >5 lbs/week collaborate with provider on medication access solutions  Follow Up Plan: Telephone follow up appointment with care management team member scheduled for:~ 5 weeks       Plan: Telephone follow up appointment with care management team member scheduled for: ~ 5 weeks  Catie Darnelle Maffucci, PharmD, [place], CPP Clinical Pharmacist [place] [phone]

## 2020-06-29 NOTE — Telephone Encounter (Signed)
Patient has been advised per Dr. Rockey Situ to hold Plavix 5 days prior to colonoscopy scheduled on 08/11/20.  Remain on Aspirin 81mg   Without interruption.  Patient verbalized understanding.  Thanks,  [place], [place]

## 2020-06-29 NOTE — Progress Notes (Signed)
06/29/20 4:29 PM   [name] 03/09/1956 [id number]  CC: BPH, ED, PSA screening  HPI: I saw Ian Mills for evaluation of the above issues.  Is a 64 year old male who is a very poor historian and has a number of comorbidities including extensive CAD and cardiomyopathy.  He reportedly had urinary retention 3 to 4 years ago in [place] that was treated with an outlet procedure by a urologist there.  He does not remember who the urologist was or what procedure he had done, but he has been urinating well without any problems since that time.  He reports he urinates with a strong stream and denies any incontinence or urinary complaints.  IPSS score today is 2, with quality of life pleased, and PVR is normal at 0 mL.  He denies any gross hematuria dysuria, or flank pain.  Recent PSA was 0.35, and well within the normal range.  He denies a family history of prostate cancer.  He also has difficulty with erections, and takes Cialis 10 mg on demand with good results.  He does not take nitrates for chest pain.   PMH: Past Medical History:  Diagnosis Date   Coronary artery disease    a. 3 stents to the LAD, 1 to the PDA, and 1 to the OM3; b. cath 06/2013: chronically occluded LAD, patent stent LAD, D1 20%, pLCx 30%, mLCx 20%, pRCA 70% (FFR 0.74) s/p PCI/DES, mRCA 70% s/p PCI/DES, RPDA 50%, EF 20% w/ AK of anterolateral & apical walls, moderately elevated LVEDP   HFrEF (heart failure with reduced ejection fraction) ([place])    a. 05/2014 Echo: EF 25-30%; b. 01/2018 Echo ([facility]): EF 30%.   HLD (hyperlipidemia)    HTN (hypertension)    ICD  single,BSX    a. DOI 11/2011; b. S/N# [id number]   Ischemic cardiomyopathy    a. 05/2014 Echo: EF 25-30%; b. 01/2018 Echo ([facility]): EF 30%, sev glob HK w/ apical/ant AK. Mild LVH. Mod LAE, mild RAE. Triv MR/TR.   Pneumonia    08/2018   Syncope and collapse    Ventricular tachycardia ([place]) 11/2011   a. s/p AICD implant     Surgical History: Past Surgical  History:  Procedure Laterality Date   appendectomy     CARDIAC CATHETERIZATION  Nov 30 2011   [facility]   CARDIAC DEFIBRILLATOR PLACEMENT  May 2013   CORONARY ANGIOPLASTY  2014   x 2 stents   CORONARY STENT PLACEMENT     x7   LEFT HEART CATH AND CORONARY ANGIOGRAPHY Left 08/30/2019   Procedure: LEFT HEART CATH AND CORONARY ANGIOGRAPHY;  Surgeon: Wellington Hampshire, MD;  Location: [facility];  Service: Cardiovascular;  Laterality: Left;   PROSTATE SURGERY     urethra grew around prostate    TUMOR REMOVAL  2001   chest thymic cyst; behind lungs [facility] benign     Family History: Family History  Problem Relation Age of Onset   Cancer Father        larynx   Cancer - Lung Mother    Hypertension Mother    Heart attack Mother    Dementia Maternal Grandmother    Heart attack Paternal Grandfather    Colon cancer Neg Hx    Stomach cancer Neg Hx    Pancreatic cancer Neg Hx     Social History:  reports that he quit smoking about 7 months ago. His smoking use included cigarettes. He has a 21.00 pack-year smoking history. He has never  used smokeless tobacco. He reports current alcohol use of about 1.0 standard drink of alcohol per week. He reports that he does not use drugs.  Physical Exam: BP (!) 168/89    Pulse 60    Ht 5\' 8"  (1.727 m)    Wt 205 lb (93 kg)    BMI 31.17 kg/m    Constitutional:  Alert and oriented, No acute distress. Cardiovascular: No clubbing, cyanosis, or edema. Respiratory: Normal respiratory effort, no increased work of breathing. GI: Abdomen is soft, nontender, nondistended, no abdominal masses  Laboratory Data: Reviewed, see HPI  Pertinent Imaging: No recent abdominal cross-sectional imaging to review  Assessment & Plan:   In summary he is a 64 year old male with extensive cardiac history who has a history of a outlet procedure 3 to 4 years ago in [place] for BPH, likely a TURP who has been urinating well since that time with normal PVRs  today and no urinary complaints.  He also has a normal PSA of 0.35, and ED responsive to Cialis.  We reviewed the AUA guidelines regarding PSA screening to the age of about 74, as well as the risks and benefits.  We also discussed the risk, benefits, and alternatives to Cialis, and a new prescription for 10 mg on demand was provided.  Cialis 10 mg on demand for ED RTC 1 year symptom check  Nickolas Madrid, MD 06/29/2020  [facility] [address] [place], [place] [address] [phone]

## 2020-06-29 NOTE — Telephone Encounter (Signed)
   [facility] Pre-operative Risk Assessment    HEARTCARE STAFF: - Please ensure there is not already an duplicate clearance open for this procedure. - Under Visit Info/Reason for Call, type in Other and utilize the format Clearance MM/DD/YY or Clearance TBD. Do not use dashes or single digits. - If request is for dental extraction, please clarify the # of teeth to be extracted.  Request for surgical clearance:  1. What type of surgery is being performed? COLONOSCOPY  2. When is this surgery scheduled? 08/11/20  3. What type of clearance is required (medical clearance vs. Pharmacy clearance to hold med vs. Both)? BOTH  4. Are there any medications that need to be held prior to surgery and how long? PLAVIX  5. Practice name and name of physician performing surgery? [facility], [place] [facility]  6. What is the office phone number? [phone]   7.   What is the office fax number? [phone]  8.   Anesthesia type (None, local, MAC, general) ? NOT LISTED   Elissa Hefty 06/29/2020, 11:37 AM  _________________________________________________________________   (provider comments below)

## 2020-06-30 LAB — URINALYSIS, COMPLETE
Bilirubin, UA: NEGATIVE
Glucose, UA: NEGATIVE
Ketones, UA: NEGATIVE
Leukocytes,UA: NEGATIVE
Nitrite, UA: NEGATIVE
Protein,UA: NEGATIVE
Specific Gravity, UA: 1.02 (ref 1.005–1.030)
Urobilinogen, Ur: 1 mg/dL (ref 0.2–1.0)
pH, UA: 6 (ref 5.0–7.5)

## 2020-06-30 LAB — MICROSCOPIC EXAMINATION: Bacteria, UA: NONE SEEN

## 2020-07-03 ENCOUNTER — Telehealth: Payer: Self-pay | Admitting: Internal Medicine

## 2020-07-03 NOTE — Telephone Encounter (Signed)
Claretta Fraise 07/03/2020 Called pt regarding community resource referral received. Left message for pt to call me back, my info is [phone] please see ref notes for more details.  [place], Care Management

## 2020-07-04 ENCOUNTER — Telehealth: Payer: Self-pay | Admitting: Pharmacist

## 2020-07-04 ENCOUNTER — Other Ambulatory Visit: Payer: Self-pay | Admitting: Cardiovascular Disease

## 2020-07-04 NOTE — Telephone Encounter (Signed)
  Chronic Care Management   Note  07/04/2020 Name: Ian Mills MRN: [id number] DOB: [birth year]   Received voicemail from patient noting that he had some questions about his medication list.   Called patient back. Notes that he signed up for a Part D plan for 2022, and that he actually already answered his own question about whether his is on [place] or just [employer]. Reviewed that Stiolto contains [employer].   Reviewed with patient that we need proof of income information for [facility] and Stiolto patient assistance application. He will get that to me at his earliest convenience.   Catie Darnelle Maffucci, PharmD, [place], CPP Clinical Pharmacist [place] [phone]

## 2020-07-10 ENCOUNTER — Telehealth: Payer: Self-pay | Admitting: Internal Medicine

## 2020-07-10 NOTE — Telephone Encounter (Signed)
Ian Mills 07/10/2020 Called pt regarding community resource referral received. Left message for pt to call me back, my info is [phone] please see ref notes for more details.  [place], Care Management

## 2020-08-02 ENCOUNTER — Telehealth: Payer: Self-pay | Admitting: Pharmacist

## 2020-08-02 ENCOUNTER — Telehealth: Payer: Medicare Other

## 2020-08-02 NOTE — Telephone Encounter (Signed)
  Chronic Care Management   Note  08/02/2020 Name: MARKY BURESH MRN: [id number] DOB: June 16, 1956   Attempted to contact patient to follow up on needing proof of income for patient assistance application. LVM for him to return my call at his convenience.   Plan: - If I do not hear back from the patient by end of business today, will attempt outreach again tomorrow  Catie Feliz Beam, PharmD, [place], CPP Clinical Psychologist, clinical at [employer] [phone]

## 2020-08-03 ENCOUNTER — Telehealth: Payer: Medicare Other

## 2020-08-03 NOTE — Telephone Encounter (Signed)
Patient returned my call, notes that he is waiting on his 2022 SSI statement to bring me proof of income for PAP applications.   Also notes that he has a large prescription deductible that was applied to part of his ezetimibe copay yesterday, so it was cheaper to run through GoodRx. He is unsure if all medications require the deductible to be paid first. He is going to fill amiodarone today and let me know if he has cost concerns.

## 2020-08-07 ENCOUNTER — Telehealth: Payer: Self-pay | Admitting: Pharmacy Technician

## 2020-08-07 NOTE — Patient Outreach (Signed)
[facility]) Care Management  08/07/2020  JAHEL WAVRA 05/22/1956 [id number]   Error.  Clarnce Homan P. Tyreque Finken, [place]  [phone]

## 2020-08-08 ENCOUNTER — Other Ambulatory Visit: Payer: Self-pay

## 2020-08-08 ENCOUNTER — Telehealth (INDEPENDENT_AMBULATORY_CARE_PROVIDER_SITE_OTHER): Payer: Medicare Other | Admitting: Internal Medicine

## 2020-08-08 ENCOUNTER — Encounter: Payer: Self-pay | Admitting: Internal Medicine

## 2020-08-08 ENCOUNTER — Other Ambulatory Visit (INDEPENDENT_AMBULATORY_CARE_PROVIDER_SITE_OTHER): Payer: Medicare Other

## 2020-08-08 ENCOUNTER — Telehealth: Payer: Self-pay | Admitting: Gastroenterology

## 2020-08-08 ENCOUNTER — Other Ambulatory Visit: Payer: Self-pay | Admitting: Internal Medicine

## 2020-08-08 VITALS — Ht 68.0 in | Wt 205.0 lb

## 2020-08-08 DIAGNOSIS — Z20822 Contact with and (suspected) exposure to covid-19: Secondary | ICD-10-CM

## 2020-08-08 DIAGNOSIS — J441 Chronic obstructive pulmonary disease with (acute) exacerbation: Secondary | ICD-10-CM | POA: Diagnosis not present

## 2020-08-08 DIAGNOSIS — J020 Streptococcal pharyngitis: Secondary | ICD-10-CM

## 2020-08-08 DIAGNOSIS — J029 Acute pharyngitis, unspecified: Secondary | ICD-10-CM

## 2020-08-08 LAB — POCT RAPID STREP A (OFFICE): Rapid Strep A Screen: POSITIVE — AB

## 2020-08-08 MED ORDER — HYDROCODONE-HOMATROPINE 5-1.5 MG/5ML PO SYRP: 5.0000 mL | ORAL_SOLUTION | Freq: Every evening | ORAL | 0 refills | Status: DC | PRN

## 2020-08-08 MED ORDER — BENZONATATE 200 MG PO CAPS: 200.0000 mg | ORAL_CAPSULE | Freq: Three times a day (TID) | ORAL | 0 refills | Status: DC | PRN

## 2020-08-08 MED ORDER — DOXYCYCLINE HYCLATE 100 MG PO TABS: 100.0000 mg | ORAL_TABLET | Freq: Two times a day (BID) | ORAL | 0 refills | Status: DC

## 2020-08-08 MED ORDER — AMOXICILLIN-POT CLAVULANATE 875-125 MG PO TABS: 1.0000 | ORAL_TABLET | Freq: Two times a day (BID) | ORAL | 0 refills | Status: DC

## 2020-08-08 MED ORDER — PREDNISONE 20 MG PO TABS: 40.0000 mg | ORAL_TABLET | Freq: Every day | ORAL | 0 refills | Status: DC

## 2020-08-08 MED ORDER — IPRATROPIUM-ALBUTEROL 0.5-2.5 (3) MG/3ML IN SOLN
3.0000 mL | Freq: Four times a day (QID) | RESPIRATORY_TRACT | 11 refills | Status: DC | PRN
Start: 2020-08-08 — End: 2022-02-08

## 2020-08-08 NOTE — Telephone Encounter (Signed)
Pt wife called to reschedule procedure for her husband.

## 2020-08-08 NOTE — Progress Notes (Signed)
Symptoms started Friday of last week, started with sneezing and escalated.  Cough, congestion, chest pain, sore throat with clear mucous.  No one around the Patient sick and Patient has not been tested.

## 2020-08-08 NOTE — Progress Notes (Signed)
Telephone Note  I connected with Ian Mills   on 08/08/20 at 10:30 AM EST by telephone and verified that I am speaking with the correct person using two identifiers.  Location patient: home, [place] Location provider:work or home office Persons participating in the virtual visit: patient, provider  I discussed the limitations of evaluation and management by telemedicine and the availability of in person appointments. The patient expressed understanding and agreed to proceed.   HPI: 1. Copd exacerbation with increased cough with sputum chest pain from coughing, sore throat, sneezing, body aches, runny nose no sick contacts wife feeling well, tried robitussin/Mucinex dm cold and flu since 08/04/20    ROS: See pertinent positives and negatives per HPI.  Past Medical History:  Diagnosis Date  . Coronary artery disease    a. 3 stents to the LAD, 1 to the PDA, and 1 to the OM3; b. cath 06/2013: chronically occluded LAD, patent stent LAD, D1 20%, pLCx 30%, mLCx 20%, pRCA 70% (FFR 0.74) s/p PCI/DES, mRCA 70% s/p PCI/DES, RPDA 50%, EF 20% w/ AK of anterolateral & apical walls, moderately elevated LVEDP  . HFrEF (heart failure with reduced ejection fraction) ([place])    a. 05/2014 Echo: EF 25-30%; b. 01/2018 Echo ([facility]): EF 30%.  Marland Kitchen HLD (hyperlipidemia)   . HTN (hypertension)   . ICD  single,BSX    a. DOI 11/2011; b. S/N# [id number]  . Ischemic cardiomyopathy    a. 05/2014 Echo: EF 25-30%; b. 01/2018 Echo ([facility]): EF 30%, sev glob HK w/ apical/ant AK. Mild LVH. Mod LAE, mild RAE. Triv MR/TR.  Marland Kitchen Pneumonia    08/2018  . Syncope and collapse   . Ventricular tachycardia ([place]) 11/2011   a. s/p AICD implant     Past Surgical History:  Procedure Laterality Date  . appendectomy    . CARDIAC CATHETERIZATION  Nov 30 2011   [facility]  . CARDIAC DEFIBRILLATOR PLACEMENT  May 2013  . CORONARY ANGIOPLASTY  2014   x 2 stents  . CORONARY STENT PLACEMENT     x7  . LEFT HEART CATH AND CORONARY ANGIOGRAPHY Left 08/30/2019    Procedure: LEFT HEART CATH AND CORONARY ANGIOGRAPHY;  Surgeon: Wellington Hampshire, MD;  Location: [facility];  Service: Cardiovascular;  Laterality: Left;  . PROSTATE SURGERY     urethra grew around prostate   . TUMOR REMOVAL  2001   chest thymic cyst; behind lungs [facility] benign      Current Outpatient Medications:  .  albuterol (VENTOLIN HFA) 108 (90 Base) MCG/ACT inhaler, Inhale 1 puff into the lungs every 6 (six) hours as needed for wheezing or shortness of breath. , Disp: , Rfl:  .  ALPRAZolam (XANAX) 0.5 MG tablet, Take 1 tablet (0.5 mg total) by mouth 3 (three) times daily as needed. for anxiety, Disp: 90 tablet, Rfl: 5 .  amiodarone (PACERONE) 200 MG tablet, Take 1.5 tablets (300 mg total) by mouth daily., Disp: 135 tablet, Rfl: 3 .  aspirin 81 MG EC tablet, Take 1 tablet (81 mg total) by mouth daily., Disp: 30 tablet, Rfl: 6 .  atorvastatin (LIPITOR) 80 MG tablet, Take 1 tablet (80 mg total) by mouth daily., Disp: 90 tablet, Rfl: 3 .  benzonatate (TESSALON) 200 MG capsule, Take 1 capsule (200 mg total) by mouth 3 (three) times daily as needed for cough., Disp: 30 capsule, Rfl: 0 .  carvedilol (COREG) 3.125 MG tablet, Take 1 tablet (3.125 mg total) by mouth 2 (two) times daily., Disp: 180  tablet, Rfl: 3 .  clopidogrel (PLAVIX) 75 MG tablet, Take 1 tablet (75 mg total) by mouth daily., Disp: 90 tablet, Rfl: 3 .  doxycycline (VIBRA-TABS) 100 MG tablet, Take 1 tablet (100 mg total) by mouth 2 (two) times daily. With food, Disp: 20 tablet, Rfl: 0 .  ezetimibe (ZETIA) 10 MG tablet, Take 1 tablet (10 mg total) by mouth daily., Disp: 90 tablet, Rfl: 3 .  furosemide (LASIX) 40 MG tablet, Take 1 tablet by mouth twice daily, Disp: 180 tablet, Rfl: 3 .  HYDROcodone-homatropine (HYCODAN) 5-1.5 MG/5ML syrup, Take 5 mLs by mouth at bedtime as needed for cough., Disp: 120 mL, Rfl: 0 .  potassium chloride (KLOR-CON 10) 10 MEQ tablet, Take 1 tablet (10 mEq total) by mouth daily., Disp: 90 tablet,  Rfl: 3 .  predniSONE (DELTASONE) 20 MG tablet, Take 2 tablets (40 mg total) by mouth daily with breakfast., Disp: 14 tablet, Rfl: 0 .  sacubitril-valsartan (ENTRESTO) 97-103 MG, Take 1 tablet by mouth 2 (two) times daily., Disp: 180 tablet, Rfl: 0 .  Tiotropium Bromide-Olodaterol (STIOLTO RESPIMAT) 2.5-2.5 MCG/ACT AERS, Inhale 2 puffs into the lungs daily., Disp: 4 g, Rfl: 0 .  ipratropium-albuterol (DUONEB) 0.5-2.5 (3) MG/3ML SOLN, Take 3 mLs by nebulization every 6 (six) hours as needed., Disp: 360 mL, Rfl: 11 .  tadalafil (CIALIS) 10 MG tablet, Take 1-2 tablets (10-20 mg total) by mouth daily as needed for erectile dysfunction. (Patient not taking: Reported on 08/08/2020), Disp: 30 tablet, Rfl: 11  EXAM:  VITALS per patient if applicable:  LUNGS: on inspection no signs of respiratory distress, breathing rate appears normal, no obvious gross SOB, gasping or wheezing +cough on exam   CV: no obvious cyanosis  MS: moves all visible extremities without noticeable abnormality  PSYCH/NEURO: pleasant and cooperative, no obvious depression or anxiety, speech and thought processing grossly intact  ASSESSMENT AND PLAN:  Discussed the following assessment and plan:  Exposure to COVID-19 virus - Plan: COVID-19, Flu A+B and RSV, predniSONE (DELTASONE) 20 MG tablet, HYDROcodone-homatropine (HYCODAN) 5-1.5 MG/5ML syrup, doxycycline (VIBRA-TABS) 100 MG tablet   COPD exacerbation (HCC) - Plan: predniSONE (DELTASONE) 20 MG tablet, HYDROcodone-homatropine (HYCODAN) 5-1.5 MG/5ML syrup, doxycycline (VIBRA-TABS) 100 MG tablet, benzonatate (TESSALON) 200 MG capsule Continue mucinex dm cold and flu  Warm salt water gargles and tea honey and lemon   Sore throat - Plan: POCT rapid strep A  -we discussed possible serious and likely etiologies, options for evaluation and workup, limitations of telemedicine visit vs in person visit, treatment, treatment risks and precautions.     I discussed the assessment  and treatment plan with the patient. The patient was provided an opportunity to ask questions and all were answered. The patient agreed with the plan and demonstrated an understanding of the instructions.    Time spent 20 min  Delorise Jackson, MD

## 2020-08-08 NOTE — Telephone Encounter (Signed)
Returned patients wife call. LVM to call office back. 

## 2020-08-09 ENCOUNTER — Other Ambulatory Visit: Payer: Self-pay

## 2020-08-09 ENCOUNTER — Telehealth: Payer: Self-pay | Admitting: Gastroenterology

## 2020-08-09 NOTE — Telephone Encounter (Signed)
Pt has been rescheduled for procedure. Endo unit has been informed about rescheduled procedure. Updated instructions will be mailed. Pt verbalized understanding.

## 2020-08-09 NOTE — Telephone Encounter (Signed)
Patient has Strep throat and needs to cancel and reschedule procedure for this Friday 08/11/20

## 2020-08-09 NOTE — Progress Notes (Signed)
Pt. Procedure has been rescheduled. Updated instructions will be mailed.

## 2020-08-10 ENCOUNTER — Ambulatory Visit: Payer: Medicare Other | Admitting: Pharmacist

## 2020-08-10 DIAGNOSIS — I25119 Atherosclerotic heart disease of native coronary artery with unspecified angina pectoris: Secondary | ICD-10-CM

## 2020-08-10 DIAGNOSIS — J449 Chronic obstructive pulmonary disease, unspecified: Secondary | ICD-10-CM

## 2020-08-10 DIAGNOSIS — I255 Ischemic cardiomyopathy: Secondary | ICD-10-CM

## 2020-08-10 DIAGNOSIS — E782 Mixed hyperlipidemia: Secondary | ICD-10-CM

## 2020-08-10 DIAGNOSIS — I10 Essential (primary) hypertension: Secondary | ICD-10-CM

## 2020-08-10 NOTE — Patient Instructions (Signed)
Visit Information  Patient Care Plan: Medication Management    Problem Identified: CHF, COPD     Long-Range Goal: Disease Progression Prevention   This Visit's Progress: On track  Recent Progress: On track  Priority: High  Note:   Current Barriers:  . Unable to independently afford treatment regimen . Complex patient with multiple comorbidities and low health literacy  Pharmacist Clinical Goal(s):  Marland Kitchen Over the next 90 days, patient will verbalize ability to afford treatment regimen. . Over the next 90 days, patient will adhere to prescribed medication regimen.  Interventions: . 1:1 collaboration with McLean-Scocuzza, Nino Glow, MD regarding development and update of comprehensive plan of care as evidenced by provider attestation and co-signature . Inter-disciplinary care team collaboration (see longitudinal plan of care) . Comprehensive medication review performed; medication list updated in electronic medical record  Health Maintenance: . Confirms that he has a Medicare Part D plan now, and that his medications (besides ezetimibe) are cheaper on insurance than GoodRx. He is appreciative of the support he received from [facility] in signing up for a plan . Notes that he "still feels bad" from upper respiratory tract infection symptoms. Awaiting COVID test result. Strep A came back, currently on prednisone and augmentin. Wonders what he can take for headache. Advised acetaminophen, and reminded to avoid NSAIDs  HFrEF s/p AICD placement (most recent EF 25-30%): Marland Kitchen Appropriately managed; current treatment: Entresto 97/103 mg BID, carvedilol 3.125 mg BID, furosemide 80 mg daily (2 40 mg tabs at once), potassium 10 mEq daily; follows w/ Dr. Rockey Situ o Receiving Delene Loll from [employer] patient assistance. Notes he has ~2 weeks left. Reviewed documentation from CPhT; patient's previous approval for Entresto runs through 08/11/20. Encouraged to call [employer] today to request a refill, as they were not  going to process his reapplication until after 08/11/20.  . Ventricular tachycardia: amiodarone 300 mg daily - Dr. Caryl Comes . Reviewed to continue to weigh daily, continue to fill BID pill box.   CAD (s/p prior stenting), Hyperlipidemia: . Controlled near goal LDL <70;  current treatment: atorvastatin 80 mg daily, ezetimibe 10 mg daily . Antiplatelet regimen: aspirin 81 mg daily, clopidogrel 75 mg daily  . Recommended to continue current regimen at this time.  Clinically, could consider transition to Nexlizet (adding bempedoic acid component to current therapy), but there are not patient assistance programs at this time and patient is unlikely to be able to afford a brand name copay. Could consider PCSK9i in the future if more aggressive lipid control is desired.   Chronic Obstructive Pulmonary Disease: . Controlled; current treatment: Stiolto 2.5/2.5 mcg daily; albuterol HFA, using more often since respiratory symptoms started . Tobacco free since 10/2019 . Received notice from CPhT that proof of income is required for [facility] assistance application. Contacted patient, he will mail proof of income to me at the office. Will pass along to CPhT for submission and f/u with BI.   Anxiety: . Inappropriately controlled; current treatment: alprazolam 0.125 mg up to TID PRN, patient taking BID for anxiety . Continue to reiterate risks of regular benzodiazepine use. Consider transition to SSRI/SNRI to hopefully allow for benzodiazepine wean moving forward.   Patient Goals/Self-Care Activities . Over the next 90 days, patient will:  - take medications as prescribed focus on medication adherence by collaboration to investigate insurance  weigh daily, and contact provider if weight gain of >3 lbs/day or >5 lbs/week collaborate with provider on medication access solutions  Follow Up Plan: Telephone follow up appointment with care  management team member scheduled for:~ 6 weeks       The patient verbalized  understanding of instructions, educational materials, and care plan provided today and declined offer to receive copy of patient instructions, educational materials, and care plan.  Plan: Telephone follow up appointment with care management team member scheduled for:  ~ 6 weeks  Catie Darnelle Maffucci, PharmD, [place], [place] Clinical Pharmacist [employer] at [employer] [phone]

## 2020-08-10 NOTE — Chronic Care Management (AMB) (Signed)
Chronic Care Management Pharmacy Note  08/10/2020 Name:  Ian Mills MRN:  [id number] DOB:  [birth year]  Subjective: Ian Mills is an 65 y.o. year old male who is a primary patient of McLean-Scocuzza, Nino Glow, MD.  The CCM team was consulted for assistance with disease management and care coordination needs.    Engaged with patient by telephone for follow up visit in response to provider referral for pharmacy case management and/or care coordination services.   Consent to Services:  The patient was given information about Chronic Care Management services, agreed to services, and gave verbal consent prior to initiation of services.  Please see initial visit note for detailed documentation.   Objective:  Lab Results  Component Value Date   CREATININE 1.33 (H) 06/09/2020   CREATININE 1.33 (H) 06/09/2020   CREATININE 1.46 (H) 03/03/2020    Lab Results  Component Value Date   HGBA1C 5.4 03/03/2020       Component Value Date/Time   CHOL 146 03/03/2020 1417   CHOL 170 12/09/2016 1103   CHOL 111 01/20/2014 0929   TRIG 92 03/03/2020 1417   TRIG 88 01/20/2014 0929   HDL 56 03/03/2020 1417   HDL 53 12/09/2016 1103   HDL 39 (L) 01/20/2014 0929   CHOLHDL 2.6 03/03/2020 1417   VLDL 26.6 06/10/2019 0955   VLDL 18 01/20/2014 0929   LDLCALC 72 03/03/2020 1417   LDLCALC 54 01/20/2014 0929    BP Readings from Last 3 Encounters:  06/29/20 (!) 168/89  06/09/20 126/80  03/27/20 118/60    Assessment: Review of patient past medical history, allergies, medications, health status, including review of consultants reports, laboratory and other test data, was performed as part of comprehensive evaluation and provision of chronic care management services.   SDOH:  (Social Determinants of Health) assessments and interventions performed:  SDOH Interventions   Flowsheet Row Most Recent Value  SDOH Interventions   Financial Strain Interventions Other (Comment)  [patient assistance]       CCM Care Plan  Allergies  Allergen Reactions  . Nitroglycerin Nausea Only and Other (See Comments)    Per patient "causes severe  headache"    Medications Reviewed Today    Reviewed by De Hollingshead, RPH-CPP (Pharmacist) on 08/10/20 at 1150  Med List Status: <None>  Medication Order Taking? Sig Documenting Provider Last Dose Status Informant  albuterol (VENTOLIN HFA) 108 (90 Base) MCG/ACT inhaler [id number] Yes Inhale 1 puff into the lungs every 6 (six) hours as needed for wheezing or shortness of breath.  [provider] Taking Active Self           Med Note Darnelle Maffucci, Waynette Buttery Aug 16, 2019  9:11 AM)    ALPRAZolam Duanne Moron) 0.5 MG tablet [id number] Yes Take 1 tablet (0.5 mg total) by mouth 3 (three) times daily as needed. for anxiety McLean-Scocuzza, Nino Glow, MD Taking Active   amiodarone (PACERONE) 200 MG tablet [id number] Yes Take 1.5 tablets (300 mg total) by mouth daily. Minna Merritts, MD Taking Active   amoxicillin-clavulanate (AUGMENTIN) 875-125 MG tablet [id number] Yes Take 1 tablet by mouth 2 (two) times daily. McLean-Scocuzza, Nino Glow, MD Taking Active   aspirin 81 MG EC tablet [id number] Yes Take 1 tablet (81 mg total) by mouth daily. Minna Merritts, MD Taking Active Self  atorvastatin (LIPITOR) 80 MG tablet [id number] Yes Take 1 tablet (80 mg total) by mouth daily. McLean-Scocuzza, Nino Glow, MD Taking Active   benzonatate [facility])  200 MG capsule [id number] Yes Take 1 capsule (200 mg total) by mouth 3 (three) times daily as needed for cough. McLean-Scocuzza, Nino Glow, MD Taking Active   carvedilol (COREG) 3.125 MG tablet [id number] Yes Take 1 tablet (3.125 mg total) by mouth 2 (two) times daily. McLean-Scocuzza, Nino Glow, MD Taking Active   clopidogrel (PLAVIX) 75 MG tablet [id number] Yes Take 1 tablet (75 mg total) by mouth daily. McLean-Scocuzza, Nino Glow, MD Taking Active   ezetimibe (ZETIA) 10 MG tablet [id number] Yes Take 1 tablet (10 mg total) by mouth  daily. McLean-Scocuzza, Nino Glow, MD Taking Active   furosemide (LASIX) 40 MG tablet [id number] Yes Take 1 tablet by mouth twice daily Gollan, Kathlene November, MD Taking Active   HYDROcodone-homatropine [facility]) 5-1.5 MG/5ML syrup [id number] Yes Take 5 mLs by mouth at bedtime as needed for cough. McLean-Scocuzza, Nino Glow, MD Taking Active   ipratropium-albuterol (DUONEB) 0.5-2.5 (3) MG/3ML SOLN [id number] Yes Take 3 mLs by nebulization every 6 (six) hours as needed. McLean-Scocuzza, Nino Glow, MD Taking Active   potassium chloride (KLOR-CON 10) 10 MEQ tablet [id number] Yes Take 1 tablet (10 mEq total) by mouth daily. McLean-Scocuzza, Nino Glow, MD Taking Active   predniSONE (DELTASONE) 20 MG tablet [id number] Yes Take 2 tablets (40 mg total) by mouth daily with breakfast. McLean-Scocuzza, Nino Glow, MD Taking Active   sacubitril-valsartan (ENTRESTO) 97-103 MG [id number] Yes Take 1 tablet by mouth 2 (two) times daily. Theora Gianotti, NP Taking Active   tadalafil (CIALIS) 10 MG tablet [id number]  Take 1-2 tablets (10-20 mg total) by mouth daily as needed for erectile dysfunction.  Patient not taking: Reported on 08/08/2020   Billey Co, MD  Active   Tiotropium Bromide-Olodaterol (STIOLTO RESPIMAT) 2.5-2.5 MCG/ACT AERS [id number] Yes Inhale 2 puffs into the lungs daily. Tyler Pita, MD Taking Active Self          Patient Active Problem List   Diagnosis Date Noted  . History of prostate surgery 06/09/2020  . Erectile dysfunction 06/09/2020  . Healthcare maintenance 03/27/2020  . Pain of left heel 03/03/2020  . Obesity (BMI 30-39.9) 03/03/2020  . Aortic atherosclerosis ([place]) 02/20/2020  . 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 Essential hypertension 10/13/2019  . Prediabetes 10/13/2019  . Unstable angina ([place])   . Carotid artery stenosis 06/10/2019  . Baker's cyst 06/10/2019  . Bilateral impacted cerumen 06/10/2019  . Chronic systolic congestive heart failure  ([place]) 06/10/2019  . Scoliosis 06/10/2019  . Anxiety 04/15/2019  . Chronic obstructive pulmonary disease ([place]) 04/15/2019  . COPD exacerbation ([place]) 09/08/2018  . Chest tightness 02/12/2018  . Pain and swelling of left lower extremity 02/12/2018  . Sacroiliac pain 07/31/2016  . Acute on chronic systolic CHF (congestive heart failure) ([place]) 06/27/2016  . Hypokalemia 06/25/2016  . NSTEMI (non-ST elevated myocardial infarction) ([place]) 06/23/2016  . Acute exacerbation of CHF (congestive heart failure) ([place]) 06/22/2016  . Type 2 diabetes mellitus ([place]) 06/22/2016  . DDD (degenerative disc disease), lumbar 06/13/2016  . Tobacco abuse 06/13/2016  . Dizziness 03/30/2015  . Former smoker 09/29/2014  . Syncope 06/01/2014  . Insomnia 06/01/2014  . Ischemic cardiomyopathy   . Ventricular tachycardia ([place]) 01/23/2012  . Single implantable cardioverter-defibrillator-BSx 12/11/2011  . SYNCOPE AND COLLAPSE 09/26/2010  . Hyperlipidemia 02/23/2010  . Hypotension 02/23/2010  . CAD (coronary artery disease) 02/23/2010    Conditions to be addressed/monitored: CHF, CAD, HTN, HLD and COPD  Care Plan : Medication Management  Updates made by De Hollingshead,  RPH-CPP since 08/10/2020 12:00 AM    Problem: CHF, COPD     Long-Range Goal: Disease Progression Prevention   This Visit's Progress: On track  Recent Progress: On track  Priority: High  Note:   Current Barriers:  . Unable to independently afford treatment regimen . Complex patient with multiple comorbidities and low health literacy  Pharmacist Clinical Goal(s):  Marland Kitchen Over the next 90 days, patient will verbalize ability to afford treatment regimen. . Over the next 90 days, patient will adhere to prescribed medication regimen.  Interventions: . 1:1 collaboration with McLean-Scocuzza, Nino Glow, MD regarding development and update of comprehensive plan of care as evidenced by provider attestation and co-signature . Inter-disciplinary care  team collaboration (see longitudinal plan of care) . Comprehensive medication review performed; medication list updated in electronic medical record  Health Maintenance: . Confirms that he has a Medicare Part D plan now, and that his medications (besides ezetimibe) are cheaper on insurance than GoodRx. He is appreciative of the support he received from [facility] in signing up for a plan . Notes that he "still feels bad" from upper respiratory tract infection symptoms. Awaiting COVID test result. Strep A came back, currently on prednisone and augmentin. Wonders what he can take for headache. Advised acetaminophen, and reminded to avoid NSAIDs  HFrEF s/p AICD placement (most recent EF 25-30%): Marland Kitchen Appropriately managed; current treatment: Entresto 97/103 mg BID, carvedilol 3.125 mg BID, furosemide 80 mg daily (2 40 mg tabs at once), potassium 10 mEq daily; follows w/ Dr. Rockey Situ o Receiving Delene Loll from [employer] patient assistance. Notes he has ~2 weeks left. Reviewed documentation from CPhT; patient's previous approval for Entresto runs through 08/11/20. Encouraged to call [employer] today to request a refill, as they were not going to process his reapplication until after 08/11/20.  . Ventricular tachycardia: amiodarone 300 mg daily - Dr. Caryl Comes . Reviewed to continue to weigh daily, continue to fill BID pill box.   CAD (s/p prior stenting), Hyperlipidemia: . Controlled near goal LDL <70;  current treatment: atorvastatin 80 mg daily, ezetimibe 10 mg daily . Antiplatelet regimen: aspirin 81 mg daily, clopidogrel 75 mg daily  . Recommended to continue current regimen at this time.  Clinically, could consider transition to Nexlizet (adding bempedoic acid component to current therapy), but there are not patient assistance programs at this time and patient is unlikely to be able to afford a brand name copay. Could consider PCSK9i in the future if more aggressive lipid control is desired.   Chronic Obstructive  Pulmonary Disease: . Controlled; current treatment: Stiolto 2.5/2.5 mcg daily; albuterol HFA, using more often since respiratory symptoms started . Tobacco free since 10/2019 . Received notice from CPhT that proof of income is required for [facility] assistance application. Contacted patient, he will mail proof of income to me at the office. Will pass along to CPhT for submission and f/u with BI.   Anxiety: . Inappropriately controlled; current treatment: alprazolam 0.125 mg up to TID PRN, patient taking BID for anxiety . Continue to reiterate risks of regular benzodiazepine use. Consider transition to SSRI/SNRI to hopefully allow for benzodiazepine wean moving forward.   Patient Goals/Self-Care Activities . Over the next 90 days, patient will:  - take medications as prescribed focus on medication adherence by collaboration to investigate insurance  weigh daily, and contact provider if weight gain of >3 lbs/day or >5 lbs/week collaborate with provider on medication access solutions  Follow Up Plan: Telephone follow up appointment with care management team member  scheduled for:~ 6 weeks       Medication Assistance: Application for Entresto, Stiolto  medication assistance program. in process.  Anticipated assistance start date TBD.  See plan of care for additional detail.  Follow Up:  Patient agrees to Care Plan and Follow-up.  Plan: Telephone follow up appointment with care management team member scheduled for:  ~ 6 weeks  Catie Darnelle Maffucci, PharmD, [place], [place] Clinical Pharmacist [employer] [facility] [employer] [phone]

## 2020-08-11 ENCOUNTER — Telehealth: Payer: Self-pay | Admitting: Internal Medicine

## 2020-08-11 ENCOUNTER — Telehealth: Payer: Self-pay

## 2020-08-11 LAB — COVID-19, FLU A+B AND RSV
Influenza A, NAA: NOT DETECTED
Influenza B, NAA: NOT DETECTED
RSV, NAA: NOT DETECTED
SARS-CoV-2, NAA: NOT DETECTED

## 2020-08-11 NOTE — Telephone Encounter (Signed)
[employer] has approved pt's assistance for [employer] until 07/28/2021. Fax was filed in [facility]

## 2020-08-11 NOTE — Telephone Encounter (Signed)
Patient called in for results for covid test done on Tuesday1-11-22

## 2020-08-14 NOTE — Telephone Encounter (Signed)
LMTCB & resent link to setup mychart so pt can review his results.

## 2020-08-21 ENCOUNTER — Telehealth: Payer: Self-pay | Admitting: Cardiovascular Disease

## 2020-08-21 NOTE — Telephone Encounter (Signed)
  Patient Consent for Virtual Visit         Ian Mills has provided verbal consent on 08/21/2020 for a virtual visit (video or telephone).   CONSENT FOR VIRTUAL VISIT FOR:  Ian Mills  By participating in this virtual visit I agree to the following:  I hereby voluntarily request, consent and authorize [facility] and its employed or contracted physicians, physician assistants, nurse practitioners or other licensed health care professionals (the Practitioner), to provide me with telemedicine health care services (the "Services") as deemed necessary by the treating Practitioner. I acknowledge and consent to receive the Services by the Practitioner via telemedicine. I understand that the telemedicine visit will involve communicating with the Practitioner through live audiovisual communication technology and the disclosure of certain medical information by electronic transmission. I acknowledge that I have been given the opportunity to request an in-person assessment or other available alternative prior to the telemedicine visit and am voluntarily participating in the telemedicine visit.  I understand that I have the right to withhold or withdraw my consent to the use of telemedicine in the course of my care at any time, without affecting my right to future care or treatment, and that the Practitioner or I may terminate the telemedicine visit at any time. I understand that I have the right to inspect all information obtained and/or recorded in the course of the telemedicine visit and may receive copies of available information for a reasonable fee.  I understand that some of the potential risks of receiving the Services via telemedicine include:  Marland Kitchen Delay or interruption in medical evaluation due to technological equipment failure or disruption; . Information transmitted may not be sufficient (e.g. poor resolution of images) to allow for appropriate medical decision making by the Practitioner;  and/or  . In rare instances, security protocols could fail, causing a breach of personal health information.  Furthermore, I acknowledge that it is my responsibility to provide information about my medical history, conditions and care that is complete and accurate to the best of my ability. I acknowledge that Practitioner's advice, recommendations, and/or decision may be based on factors not within their control, such as incomplete or inaccurate data provided by me or distortions of diagnostic images or specimens that may result from electronic transmissions. I understand that the practice of medicine is not an exact science and that Practitioner makes no warranties or guarantees regarding treatment outcomes. I acknowledge that a copy of this consent can be made available to me via my patient portal (Ian Mills), or I can request a printed copy by calling the office of [facility].    I understand that my insurance will be billed for this visit.   I have read or had this consent read to me. . I understand the contents of this consent, which adequately explains the benefits and risks of the Services being provided via telemedicine.  . I have been provided ample opportunity to ask questions regarding this consent and the Services and have had my questions answered to my satisfaction. . I give my informed consent for the services to be provided through the use of telemedicine in my medical care

## 2020-08-23 ENCOUNTER — Other Ambulatory Visit: Payer: Self-pay

## 2020-08-23 ENCOUNTER — Telehealth (INDEPENDENT_AMBULATORY_CARE_PROVIDER_SITE_OTHER): Payer: Medicare Other | Admitting: Cardiovascular Disease

## 2020-08-23 ENCOUNTER — Encounter: Payer: Self-pay | Admitting: Cardiovascular Disease

## 2020-08-23 VITALS — BP 163/79 | HR 62 | Ht 68.0 in | Wt 198.0 lb

## 2020-08-23 DIAGNOSIS — I25118 Atherosclerotic heart disease of native coronary artery with other forms of angina pectoris: Secondary | ICD-10-CM | POA: Diagnosis not present

## 2020-08-23 DIAGNOSIS — I5023 Acute on chronic systolic (congestive) heart failure: Secondary | ICD-10-CM

## 2020-08-23 DIAGNOSIS — I255 Ischemic cardiomyopathy: Secondary | ICD-10-CM

## 2020-08-23 DIAGNOSIS — I472 Ventricular tachycardia, unspecified: Secondary | ICD-10-CM

## 2020-08-23 DIAGNOSIS — I700 Atherosclerosis of aorta: Secondary | ICD-10-CM

## 2020-08-23 DIAGNOSIS — J42 Unspecified chronic bronchitis: Secondary | ICD-10-CM | POA: Diagnosis not present

## 2020-08-23 DIAGNOSIS — I10 Essential (primary) hypertension: Secondary | ICD-10-CM

## 2020-08-23 NOTE — Progress Notes (Signed)
Virtual Visit via Video Note   This visit type was conducted due to national recommendations for restrictions regarding the COVID-19 Pandemic (e.g. social distancing) in an effort to limit this patient's exposure and mitigate transmission in our community.  Due to his co-morbid illnesses, this patient is at least at moderate risk for complications without adequate follow up.  This format is felt to be most appropriate for this patient at this time.  All issues noted in this document were discussed and addressed.  A limited physical exam was performed with this format.  Please refer to the patient's chart for his consent to telehealth for [facility].  Video Connection Lost Video connection was lost at < 50% of the duration of this visit, at which time the remainder of the visit was completed via audio only.    I connected with  Ian Mills on 08/23/20 by a video enabled telemedicine application and verified that I am speaking with the correct person using two identifiers. I am contacting the patient above from our cardiology clinic office or alternate office work station to their home, I discussed the limitations of evaluation and management by telemedicine. The patient expressed understanding and agreed to proceed.   Evaluation Performed:  Follow-up visit  Date:  08/23/2020   ID:  Ian Mills, Ian Mills [birth year], MRN [id number]  Patient Location:  [place] [address]   Provider location:   [facility], [place] office  PCP:  McLean-Scocuzza, Nino Glow, MD  Cardiologist:  Patsy Baltimore   Chief Complaint  Patient presents with  . Other    Needs to discuss Medication change. Meds reviewed verbally with patient.     History of Present Illness:    Ian Mills is a 65 y.o. male who presents via audio/video conferencing for a telehealth visit today.   The patient does not symptoms concerning for COVID-19 infection (fever, chills, cough, or new  SHORTNESS OF BREATH).   Patient has a past medical history of Smoking, continues to smoke 1 pack a week coronary artery disease,  3 stents placed to his mid LAD, one stent to the PDA, one stent to the OM 3 in 08/2006  chest pressure during his divorce with stress test at [facility],  cardiac catheterization at [facility], 11/2011 syncope x4 episodes total over the past several years ECHO EF25 to 30%, 2015 ICD, ejection fraction 30-35%, chronic back and hip pain ICD, ischemic cardiomyopathy He presents today for follow-up of his coronary artery disease,  Heart catheterization last month  Last seen approximately 1 year ago Has not been in the hospital for cardiac issues since that time  Got entresto approval from company  BP elevated at home but has not been checking on a regular basis Reports he will check more  Lost weight over the past year  No chest pain, no significant shortness of breath  Recent strep throat, was treated  Not smoking since last year  active at baseline with no unstable anginal symptoms  Lab work reviewed from November 2021 CR 1.33 HGBA1C 5.4  Other past medical history reviewed Stress test August 10, 2019 showing fixed defects anterior wall  Heart cath 08/30/2019 1. Significant underlying three-vessel coronary artery disease with chronically occluded mid LAD, patent first diagonal stent, patent RCA stents with mild in-stent restenosis, patent left circumflex stent with moderate in-stent restenosis. Progression of ostial left circumflex stenosis to 60%. 2. Left ventricular angiography was not  performed due to chronic kidney disease. 3. Moderately elevated left ventricular end-diastolic pressure between 25 to 30 mmHg.  medical therapy recommended.   Seen in the hospital February 2020 for COPD exacerbation PNA  Previously with back and sciatic pain, improved with cortisone shots, two years ago seen by [facility] for chronic  back pain   Prior CV studies:   The following studies were reviewed today:    Past Medical History:  Diagnosis Date  . Coronary artery disease    a. 3 stents to the LAD, 1 to the PDA, and 1 to the OM3; b. cath 06/2013: chronically occluded LAD, patent stent LAD, D1 20%, pLCx 30%, mLCx 20%, pRCA 70% (FFR 0.74) s/p PCI/DES, mRCA 70% s/p PCI/DES, RPDA 50%, EF 20% w/ AK of anterolateral & apical walls, moderately elevated LVEDP  . HFrEF (heart failure with reduced ejection fraction) ([place])    a. 05/2014 Echo: EF 25-30%; b. 01/2018 Echo ([facility]): EF 30%.  Marland Kitchen HLD (hyperlipidemia)   . HTN (hypertension)   . ICD  single,BSX    a. DOI 11/2011; b. S/N# [id number]  . Ischemic cardiomyopathy    a. 05/2014 Echo: EF 25-30%; b. 01/2018 Echo ([facility]): EF 30%, sev glob HK w/ apical/ant AK. Mild LVH. Mod LAE, mild RAE. Triv MR/TR.  Marland Kitchen Pneumonia    08/2018  . Syncope and collapse   . Ventricular tachycardia ([place]) 11/2011   a. s/p AICD implant    Past Surgical History:  Procedure Laterality Date  . appendectomy    . CARDIAC CATHETERIZATION  Nov 30 2011   [facility]  . CARDIAC DEFIBRILLATOR PLACEMENT  May 2013  . CORONARY ANGIOPLASTY  2014   x 2 stents  . CORONARY STENT PLACEMENT     x7  . LEFT HEART CATH AND CORONARY ANGIOGRAPHY Left 08/30/2019   Procedure: LEFT HEART CATH AND CORONARY ANGIOGRAPHY;  Surgeon: Wellington Hampshire, MD;  Location: [place] [facility];  Service: Cardiovascular;  Laterality: Left;  . PROSTATE SURGERY     urethra grew around prostate   . TUMOR REMOVAL  2001   chest thymic cyst; behind lungs [facility] benign       Allergies:   Nitroglycerin   Social History   Tobacco Use  . Smoking status: Former Smoker    Packs/day: 0.50    Years: 42.00    Pack years: 21.00    Types: Cigarettes    Quit date: 11/10/2019    Years since quitting: 0.7  . Smokeless tobacco: Never Used  Vaping Use  . Vaping Use: Never used  Substance Use Topics  . Alcohol use: Yes    Alcohol/week: 1.0 standard drink     Types: 1 Cans of beer per week    Comment: per week  . Drug use: No     Current Outpatient Medications on File Prior to Visit  Medication Sig Dispense Refill  . albuterol (VENTOLIN HFA) 108 (90 Base) MCG/ACT inhaler Inhale 1 puff into the lungs every 6 (six) hours as needed for wheezing or shortness of breath.     . ALPRAZolam (XANAX) 0.5 MG tablet Take 1 tablet (0.5 mg total) by mouth 3 (three) times daily as needed. for anxiety 90 tablet 5  . amiodarone (PACERONE) 200 MG tablet Take 1.5 tablets (300 mg total) by mouth daily. 135 tablet 3  . aspirin 81 MG EC tablet Take 1 tablet (81 mg total) by mouth daily. 30 tablet 6  . atorvastatin (LIPITOR) 80 MG tablet Take 1 tablet (  80 mg total) by mouth daily. 90 tablet 3  . benzonatate (TESSALON) 200 MG capsule Take 1 capsule (200 mg total) by mouth 3 (three) times daily as needed for cough. 30 capsule 0  . carvedilol (COREG) 3.125 MG tablet Take 1 tablet (3.125 mg total) by mouth 2 (two) times daily. 180 tablet 3  . clopidogrel (PLAVIX) 75 MG tablet Take 1 tablet (75 mg total) by mouth daily. 90 tablet 3  . ezetimibe (ZETIA) 10 MG tablet Take 1 tablet (10 mg total) by mouth daily. 90 tablet 3  . furosemide (LASIX) 40 MG tablet Take 1 tablet by mouth twice daily 180 tablet 3  . ipratropium-albuterol (DUONEB) 0.5-2.5 (3) MG/3ML SOLN Take 3 mLs by nebulization every 6 (six) hours as needed. 360 mL 11  . potassium chloride (KLOR-CON 10) 10 MEQ tablet Take 1 tablet (10 mEq total) by mouth daily. 90 tablet 3  . sacubitril-valsartan (ENTRESTO) 97-103 MG Take 1 tablet by mouth 2 (two) times daily. 180 tablet 0  . tadalafil (CIALIS) 10 MG tablet Take 1-2 tablets (10-20 mg total) by mouth daily as needed for erectile dysfunction. 30 tablet 11  . Tiotropium Bromide-Olodaterol (STIOLTO RESPIMAT) 2.5-2.5 MCG/ACT AERS Inhale 2 puffs into the lungs daily. 4 g 0   No current facility-administered medications on file prior to visit.     Family Hx: The patient's  family history includes Cancer in his father; Cancer - Lung in his mother; Dementia in his maternal grandmother; Heart attack in his mother and paternal grandfather; Hypertension in his mother. There is no history of Colon cancer, Stomach cancer, or Pancreatic cancer.  ROS:   Please see the history of present illness.    Review of Systems  Constitutional: Negative.   HENT: Negative.   Respiratory: Negative.   Cardiovascular: Negative.   Gastrointestinal: Negative.   Musculoskeletal: Negative.   Neurological: Negative.   Psychiatric/Behavioral: Negative.   All other systems reviewed and are negative.    Labs/Other Tests and Data Reviewed:    Recent Labs: 03/03/2020: ALT 34; Hemoglobin 15.3; Platelets 172 06/09/2020: BUN 19; BUN 19; Creat 1.33; Creat 1.33; Potassium 4.7; Potassium 4.7; Sodium 139; Sodium 139   Recent Lipid Panel Lab Results  Component Value Date/Time   CHOL 146 03/03/2020 02:17 PM   CHOL 170 12/09/2016 11:03 AM   CHOL 111 01/20/2014 09:29 AM   TRIG 92 03/03/2020 02:17 PM   TRIG 88 01/20/2014 09:29 AM   HDL 56 03/03/2020 02:17 PM   HDL 53 12/09/2016 11:03 AM   HDL 39 (L) 01/20/2014 09:29 AM   CHOLHDL 2.6 03/03/2020 02:17 PM   LDLCALC 72 03/03/2020 02:17 PM   LDLCALC 54 01/20/2014 09:29 AM    Wt Readings from Last 3 Encounters:  08/23/20 198 lb (89.8 kg)  08/08/20 205 lb (93 kg)  06/29/20 205 lb (93 kg)     Exam:    Vital Signs: Vital signs may also be detailed in the HPI BP (!) 163/79   Pulse 62   Ht 5\' 8"  (1.727 m)   Wt 198 lb (89.8 kg)   SpO2 99%   BMI 30.11 kg/m   Wt Readings from Last 3 Encounters:  08/23/20 198 lb (89.8 kg)  08/08/20 205 lb (93 kg)  06/29/20 205 lb (93 kg)   Temp Readings from Last 3 Encounters:  06/09/20 97.7 F (36.5 C) (Oral)  03/27/20 (!) 96.9 F (36.1 C) (Temporal)  03/03/20 98.2 F (36.8 C)   BP Readings from Last 3 Encounters:  08/23/20 (!) 163/79  06/29/20 (!) 168/89  06/09/20 126/80   Pulse Readings  from Last 3 Encounters:  08/23/20 62  06/29/20 60  06/09/20 (!) 48     Well nourished, well developed male in no acute distress. Constitutional:  oriented to person, place, and time. No distress.  Head: Normocephalic and atraumatic.  Eyes:  no discharge. No scleral icterus.  Neck: Normal range of motion. Neck supple.  Pulmonary/Chest: No audible wheezing, no distress, appears comfortable Musculoskeletal: Normal range of motion.  no  tenderness or deformity.  Neurological:   Coordination normal. Full exam not performed Skin:  No rash Psychiatric:  normal mood and affect. behavior is normal. Thought content normal.    ASSESSMENT & PLAN:    Problem List Items Addressed This Visit      Cardiology Problems   Aortic atherosclerosis (HCC)   Essential hypertension   Acute on chronic systolic CHF (congestive heart failure) (HCC) - Primary   CAD (coronary artery disease)   Ventricular tachycardia ([place])     Other   Chronic obstructive pulmonary disease ([place])     Ischemic cardiomyopathy Reports he is euvolemic, denies unstable angina symptoms Needs follow-up office visit with EP, ICD in place We will continue current medications as above Entresto renewed through the company patient assistance  Essential hypertension Reports numbers high today but has not been checking it on a regular basis at home Has general malaise today sinus with some wheezing He will monitor blood pressure at home and call us at the end of the week into next week with some numbers Further medication adjustment may be needed if systolic pressure continues to run high, this was discussed with him in detail  History of VT Denies any recent tachypalpitations, no recent ICD shocks No real hospitalizations  Hyperlipidemia Numbers improving, reports compliance with his statin  Coronary disease with stable angina Reports he is active, denies unstable angina, suggested he continue regular exercise  program Non-smoker, cholesterol close to goal   COVID-19 Education: The signs and symptoms of COVID-19 were discussed with the patient and how to seek care for testing (follow up with PCP or arrange E-visit).  The importance of social distancing was discussed today.  Patient Risk:   After full review of this patients clinical status, I feel that they are at least moderate risk at this time.  Time:   Today, I have spent 25 minutes with the patient with telehealth technology discussing the cardiac and medical problems/diagnoses detailed above   Additional 10 min spent reviewing the chart prior to patient visit today   Medication Adjustments/Labs and Tests Ordered: Current medicines are reviewed at length with the patient today.  Concerns regarding medicines are outlined above.   Tests Ordered: No tests ordered   Medication Changes: No changes made    Signed, Ida Rogue, MD  [place] Office [address] [place] [address], [place], [place] [address]

## 2020-08-23 NOTE — Patient Instructions (Addendum)
Please stop your Plavix 5 days prior to colonoscopy, stay on aspirin  Medication Instructions:  No changes  If you need a refill on your cardiac medications before your next appointment, please call your pharmacy.   Lab work: No new labs needed  Testing/Procedures: No new testing needed   Follow-Up: At [facility], you and your health needs are our priority.  As part of our continuing mission to provide you with exceptional heart care, we have created designated Provider Care Teams.  These Care Teams include your primary Cardiologist (physician) and Advanced Practice Providers (APPs -  Physician Assistants and Nurse Practitioners) who all work together to provide you with the care you need, when you need it.  . You will need a follow up appointment in 6 months  . Providers on your designated Care Team:   . Murray Hodgkins, NP . Christell Faith, PA-C . Marrianne Mood, PA-C  Any Other Special Instructions Will Be Listed Below (If Applicable).  COVID-19 Vaccine Information can be found at: [email or web address].co.uk For questions related to vaccine distribution or appointments, please email [email or web address] or call [phone].   Please monitor blood pressures and keep a log of your readings.  Please call with blood pressure measurements in the next week or two or upload them to your MyChart through messenger.   How to use a home blood pressure monitor. . Be still. . Measure at the same time every day. It's important to take the readings at the same time each day, such as morning and evening. Take reading approximately 1 1/2 to 2 hours after BP medications.   AVOID these things for 30 minutes before checking your blood pressure:  Drinking caffeine.  Drinking alcohol.  Eating.  Smoking.  Exercising.   Five minutes before checking your blood pressure:  Pee.  Sit in a dining chair. Avoid sitting in a soft couch or  armchair.  Be quiet. Do not talk.       Sit correctly. Sit with your back straight and supported (on a dining chair, rather than a sofa). Your feet should be flat on the floor and your legs should not be crossed. Your arm should be supported on a flat surface (such as a table) with the upper arm at heart level. Make sure the bottom of the cuff is placed directly above the bend of the elbow.

## 2020-09-04 ENCOUNTER — Telehealth: Payer: Self-pay | Admitting: Gastroenterology

## 2020-09-04 NOTE — Telephone Encounter (Signed)
Patient called and wants instructions on Covid testing prior to his porcedure

## 2020-09-06 NOTE — Telephone Encounter (Signed)
Returned patients call. LVM inform pt of covid test hours and date. Instructed pt all information is located in my chart under letters.

## 2020-09-12 ENCOUNTER — Ambulatory Visit (INDEPENDENT_AMBULATORY_CARE_PROVIDER_SITE_OTHER): Payer: Medicare Other

## 2020-09-12 DIAGNOSIS — I472 Ventricular tachycardia, unspecified: Secondary | ICD-10-CM

## 2020-09-12 LAB — CUP PACEART REMOTE DEVICE CHECK
Battery Remaining Longevity: 54 mo
Battery Remaining Percentage: 57 %
Brady Statistic RV Percent Paced: 0 %
Date Time Interrogation Session: 20220215024400
HighPow Impedance: 81 Ohm
Implantable Lead Implant Date: 20130503
Implantable Lead Location: 753860
Implantable Lead Model: 292
Implantable Lead Serial Number: 112568
Implantable Pulse Generator Implant Date: 20130503
Lead Channel Impedance Value: 592 Ohm
Lead Channel Pacing Threshold Amplitude: 0.8 V
Lead Channel Pacing Threshold Pulse Width: 0.5 ms
Lead Channel Setting Pacing Amplitude: 2.4 V
Lead Channel Setting Pacing Pulse Width: 0.5 ms
Lead Channel Setting Sensing Sensitivity: 0.6 mV
Pulse Gen Serial Number: 105232

## 2020-09-14 ENCOUNTER — Telehealth: Payer: Self-pay

## 2020-09-14 NOTE — Telephone Encounter (Signed)
Returned patients call. Informed patient his COVID testing is 2/23. They will call him with a time to report. Explained to patient instructions were mailed out and he can find them in his my chart. Pt verbalized understanding.

## 2020-09-14 NOTE — Telephone Encounter (Signed)
Hi Ian Mills. This patient called [place] today for clarification about COVID testing and pre-procedure instructions. He did not understand the instructions in your message and is not tech savvy enough to be able to find the letter in [place]. Would you mind calling him back to review and ensure he understands? He asked to call on his home number. Thanks!

## 2020-09-19 NOTE — Progress Notes (Signed)
Remote ICD transmission.   

## 2020-09-20 ENCOUNTER — Telehealth: Payer: Self-pay | Admitting: Gastroenterology

## 2020-09-20 ENCOUNTER — Other Ambulatory Visit: Admission: RE | Admit: 2020-09-20 | Payer: Medicare Other | Source: Ambulatory Visit

## 2020-09-20 ENCOUNTER — Telehealth: Payer: Self-pay

## 2020-09-20 NOTE — Telephone Encounter (Signed)
Returned patients call. Pt requested to change procedure date. LVM to call office back.

## 2020-09-20 NOTE — Telephone Encounter (Signed)
Called pt to inquire if he'd like to reschedule. Unable to contact, LVM to return call.

## 2020-09-20 NOTE — Telephone Encounter (Signed)
Patient called to request cancellation for procedure on 09/22/20

## 2020-09-21 ENCOUNTER — Ambulatory Visit (INDEPENDENT_AMBULATORY_CARE_PROVIDER_SITE_OTHER): Payer: Medicare Other | Admitting: Pharmacist

## 2020-09-21 DIAGNOSIS — E782 Mixed hyperlipidemia: Secondary | ICD-10-CM | POA: Diagnosis not present

## 2020-09-21 DIAGNOSIS — I10 Essential (primary) hypertension: Secondary | ICD-10-CM

## 2020-09-21 DIAGNOSIS — I25119 Atherosclerotic heart disease of native coronary artery with unspecified angina pectoris: Secondary | ICD-10-CM

## 2020-09-21 DIAGNOSIS — F419 Anxiety disorder, unspecified: Secondary | ICD-10-CM

## 2020-09-21 DIAGNOSIS — I255 Ischemic cardiomyopathy: Secondary | ICD-10-CM | POA: Diagnosis not present

## 2020-09-21 DIAGNOSIS — J449 Chronic obstructive pulmonary disease, unspecified: Secondary | ICD-10-CM | POA: Diagnosis not present

## 2020-09-21 DIAGNOSIS — Z87891 Personal history of nicotine dependence: Secondary | ICD-10-CM

## 2020-09-21 NOTE — Chronic Care Management (AMB) (Signed)
Chronic Care Management Pharmacy Note  09/21/2020 Name:  Ian Mills MRN:  [id number] DOB:  03-11-1956  Subjective: Ian Mills is an 65 y.o. year old male who is a primary patient of McLean-Scocuzza, Nino Glow, MD.  The CCM team was consulted for assistance with disease management and care coordination needs.    Engaged with patient by telephone for follow up visit in response to provider referral for pharmacy case management and/or care coordination services.   Consent to Services:  The patient was given information about Chronic Care Management services, agreed to services, and gave verbal consent prior to initiation of services.  Please see initial visit note for detailed documentation.   Patient Care Team: McLean-Scocuzza, Nino Glow, MD as PCP - General (Internal Medicine) Minna Merritts, MD as PCP - Cardiology (Cardiology) Deboraha Sprang, MD as PCP - Electrophysiology (Cardiology) Minna Merritts, MD as Consulting Physician (Cardiology) De Hollingshead, RPH-CPP as Pharmacist (Pharmacist)  Recent office visits: None since our last appointment  Recent consult visits:  1/26- cardiology Dr. Rockey Situ, BP elevated, asked to check BP at home and provide f/u. Patient has not done this yet  Hospital visits: None in previous 6 months  Objective:  Lab Results  Component Value Date   CREATININE 1.33 (H) 06/09/2020   CREATININE 1.33 (H) 06/09/2020   BUN 19 06/09/2020   BUN 19 06/09/2020   GFR 49.11 (L) 06/10/2019   GFRNONAA 55 (L) 06/09/2020   GFRAA 64 06/09/2020   NA 139 06/09/2020   NA 139 06/09/2020   K 4.7 06/09/2020   K 4.7 06/09/2020   CALCIUM 9.3 06/09/2020   CALCIUM 9.3 06/09/2020   CO2 23 06/09/2020   CO2 23 06/09/2020    Lab Results  Component Value Date/Time   HGBA1C 5.4 03/03/2020 02:17 PM   HGBA1C 5.7 06/10/2019 09:55 AM   GFR 49.11 (L) 06/10/2019 09:55 AM   MICROALBUR 0.8 03/03/2020 02:17 PM    Last diabetic Eye exam: No results found for:  HMDIABEYEEXA  Last diabetic Foot exam: No results found for: HMDIABFOOTEX   Lab Results  Component Value Date   CHOL 146 03/03/2020   HDL 56 03/03/2020   LDLCALC 72 03/03/2020   TRIG 92 03/03/2020   CHOLHDL 2.6 03/03/2020    Hepatic Function Latest Ref Rng & Units 03/03/2020 08/23/2019 06/10/2019  Total Protein 6.1 - 8.1 g/dL 7.3 7.1 6.8  Albumin 3.5 - 5.0 g/dL - 3.8 4.1  AST 10 - 35 U/L 22 26 22   ALT 9 - 46 U/L 34 41 29  Alk Phosphatase 38 - 126 U/L - 107 117  Total Bilirubin 0.2 - 1.2 mg/dL 0.6 0.8 0.4  Bilirubin, Direct 0.0 - 0.2 mg/dL - 0.1 -    Lab Results  Component Value Date/Time   TSH 1.94 06/10/2019 09:55 AM   TSH 2.080 04/22/2017 10:07 AM    CBC Latest Ref Rng & Units 03/03/2020 08/23/2019 08/20/2019  WBC 3.8 - 10.8 Thousand/uL 6.2 10.4 7.0  Hemoglobin 13.2 - 17.1 g/dL 15.3 14.2 15.1  Hematocrit 38.5 - 50.0 % 45.8 43.9 47.0  Platelets 140 - 400 Thousand/uL 172 204 193    Lab Results  Component Value Date/Time   VD25OH 31.05 06/10/2019 09:55 AM    Clinical ASCVD: Yes  The ASCVD Risk score (Goff DC Jr., et al., 2013) failed to calculate for the following reasons:   The patient has a prior MI or stroke diagnosis    Depression screen [facility] 2/9  04/13/2020 03/03/2020 10/12/2019  Decreased Interest 0 0 0  Down, Depressed, Hopeless 0 0 0  PHQ - 2 Score 0 0 0  Altered sleeping - - -  Tired, decreased energy - - -  Change in appetite - - -  Feeling bad or failure about yourself  - - -  Trouble concentrating - - -  Moving slowly or fidgety/restless - - -  Suicidal thoughts - - -  PHQ-9 Score - - -  Difficult doing work/chores - - -  Some recent data might be hidden     Social History   Tobacco Use  Smoking Status Former Smoker  . Packs/day: 0.50  . Years: 42.00  . Pack years: 21.00  . Types: Cigarettes  . Quit date: 11/10/2019  . Years since quitting: 0.8  Smokeless Tobacco Never Used   BP Readings from Last 3 Encounters:  08/23/20 (!) 163/79  06/29/20 (!)  168/89  06/09/20 126/80   Pulse Readings from Last 3 Encounters:  08/23/20 62  06/29/20 60  06/09/20 (!) 52   Wt Readings from Last 3 Encounters:  08/23/20 198 lb (89.8 kg)  08/08/20 205 lb (93 kg)  06/29/20 205 lb (93 kg)    Assessment/Interventions: Review of patient past medical history, allergies, medications, health status, including review of consultants reports, laboratory and other test data, was performed as part of comprehensive evaluation and provision of chronic care management services.   SDOH:  (Social Determinants of Health) assessments and interventions performed: Yes SDOH Interventions   Flowsheet Row Most Recent Value  SDOH Interventions   Financial Strain Interventions Other (Comment)  [manufacturer assistance]      CCM Care Plan  Allergies  Allergen Reactions  . Nitroglycerin Nausea Only and Other (See Comments)    Per patient "causes severe  headache"    Medications Reviewed Today    Reviewed by De Hollingshead, RPH-CPP (Pharmacist) on 09/21/20 at 1200  Med List Status: <None>  Medication Order Taking? Sig Documenting Provider Last Dose Status Informant  albuterol (VENTOLIN HFA) 108 (90 Base) MCG/ACT inhaler [id number] No Inhale 1 puff into the lungs every 6 (six) hours as needed for wheezing or shortness of breath.   Patient not taking: Reported on 09/21/2020   [provider] Not Taking Active Self           Med Note [facility] Ao Aug 16, 2019  9:11 AM)    ALPRAZolam Duanne Moron) 0.5 MG tablet [id number] Yes Take 1 tablet (0.5 mg total) by mouth 3 (three) times daily as needed. for anxiety McLean-Scocuzza, Nino Glow, MD Taking Active   amiodarone (PACERONE) 200 MG tablet [id number] Yes Take 1.5 tablets (300 mg total) by mouth daily. Minna Merritts, MD Taking Active   aspirin 81 MG EC tablet [id number] Yes Take 1 tablet (81 mg total) by mouth daily. Minna Merritts, MD Taking Active Self  atorvastatin (LIPITOR) 80 MG tablet [id number]  Yes Take 1 tablet (80 mg total) by mouth daily. McLean-Scocuzza, Nino Glow, MD Taking Active   carvedilol (COREG) 3.125 MG tablet [id number] Yes Take 1 tablet (3.125 mg total) by mouth 2 (two) times daily. McLean-Scocuzza, Nino Glow, MD Taking Active   clopidogrel (PLAVIX) 75 MG tablet [id number] Yes Take 1 tablet (75 mg total) by mouth daily. McLean-Scocuzza, Nino Glow, MD Taking Active   ezetimibe (ZETIA) 10 MG tablet [id number] Yes Take 1 tablet (10 mg total) by mouth daily. McLean-Scocuzza, Nino Glow, MD Taking Active   furosemide (  LASIX) 40 MG tablet [id number] Yes Take 1 tablet by mouth twice daily Gollan, Kathlene November, MD Taking Active            Med Note Darnelle Maffucci, Arville Lime   Thu Sep 21, 2020 11:59 AM) 80 mg QAM  ipratropium-albuterol (DUONEB) 0.5-2.5 (3) MG/3ML SOLN [id number] No Take 3 mLs by nebulization every 6 (six) hours as needed.  Patient not taking: Reported on 09/21/2020   McLean-Scocuzza, Nino Glow, MD Not Taking Active   potassium chloride (KLOR-CON 10) 10 MEQ tablet [id number] Yes Take 1 tablet (10 mEq total) by mouth daily. McLean-Scocuzza, Nino Glow, MD Taking Active   sacubitril-valsartan [facility]) 97-103 MG [id number] Yes Take 1 tablet by mouth 2 (two) times daily. Theora Gianotti, NP Taking Active   tadalafil (CIALIS) 10 MG tablet [id number] Yes Take 1-2 tablets (10-20 mg total) by mouth daily as needed for erectile dysfunction. Billey Co, MD Taking Active   Tiotropium Bromide-Olodaterol (STIOLTO RESPIMAT) 2.5-2.5 MCG/ACT AERS [id number] Yes Inhale 2 puffs into the lungs daily. Tyler Pita, MD Taking Active Self          Patient Active Problem List   Diagnosis Date Noted  . History of prostate surgery 06/09/2020  . Erectile dysfunction 06/09/2020  . Healthcare maintenance 03/27/2020  . Pain of left heel 03/03/2020  . Obesity (BMI 30-39.9) 03/03/2020  . Aortic atherosclerosis ([place]) 02/20/2020  . 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  Essential hypertension 10/13/2019  . Prediabetes 10/13/2019  . Unstable angina ([place])   . Carotid artery stenosis 06/10/2019  . Baker's cyst 06/10/2019  . Bilateral impacted cerumen 06/10/2019  . Chronic systolic congestive heart failure ([place]) 06/10/2019  . Scoliosis 06/10/2019  . Anxiety 04/15/2019  . Chronic obstructive pulmonary disease ([place]) 04/15/2019  . COPD exacerbation ([place]) 09/08/2018  . Chest tightness 02/12/2018  . Pain and swelling of left lower extremity 02/12/2018  . Sacroiliac pain 07/31/2016  . Acute on chronic systolic CHF (congestive heart failure) ([place]) 06/27/2016  . Hypokalemia 06/25/2016  . NSTEMI (non-ST elevated myocardial infarction) ([place]) 06/23/2016  . Acute exacerbation of CHF (congestive heart failure) ([place]) 06/22/2016  . Type 2 diabetes mellitus ([place]) 06/22/2016  . DDD (degenerative disc disease), lumbar 06/13/2016  . Tobacco abuse 06/13/2016  . Dizziness 03/30/2015  . Former smoker 09/29/2014  . Syncope 06/01/2014  . Insomnia 06/01/2014  . Ischemic cardiomyopathy   . Ventricular tachycardia ([place]) 01/23/2012  . Single implantable cardioverter-defibrillator-BSx 12/11/2011  . SYNCOPE AND COLLAPSE 09/26/2010  . Hyperlipidemia 02/23/2010  . Hypotension 02/23/2010  . CAD (coronary artery disease) 02/23/2010    Immunization History  Administered Date(s) Administered  . Fluad Quad(high Dose 65+) 06/10/2019, 06/09/2020  . Influenza,inj,Quad PF,6+ Mos 06/26/2016  . PFIZER(Purple Top)SARS-COV-2 Vaccination 12/22/2019, 01/12/2020, 06/02/2020  . Pneumococcal Polysaccharide-23 09/09/2018  . Tdap 03/03/2020    Conditions to be addressed/monitored:  Hypertension, Hyperlipidemia, Heart Failure, Coronary Artery Disease, COPD and Anxiety  Care Plan : Medication Management  Updates made by De Hollingshead, RPH-CPP since 09/21/2020 12:00 AM    Problem: CHF, COPD     Long-Range Goal: Disease Progression Prevention   Recent Progress: On track  Priority:  High  Note:   Current Barriers:  . Unable to independently afford treatment regimen . Complex patient with multiple comorbidities and low health literacy  Pharmacist Clinical Goal(s):  Marland Kitchen Over the next 90 days, patient will verbalize ability to afford treatment regimen. . Over the next 90 days, patient will adhere to prescribed medication regimen.  Interventions: . 1:1 collaboration with McLean-Scocuzza, Nino Glow, MD regarding development and update of comprehensive plan of care as evidenced by provider attestation and co-signature . Inter-disciplinary care team collaboration (see longitudinal plan of care) . Comprehensive medication review performed; medication list updated in electronic medical record  Health Maintenance: . Colonoscopy - notes he had to reschedule colonoscopy because his truck broke down and he was not going to have transportation. Plans to reschedule when his truck is fixed. Denies financial concerns at this time regarding the truck.   HFrEF s/p AICD placement (most recent EF 25-30%): Marland Kitchen Appropriately managed; current treatment: Entresto 97/103 mg BID, carvedilol 3.125 mg BID, furosemide 80 mg daily (2 40 mg tabs at once), potassium 10 mEq daily; follows w/ Dr. Rockey Situ . Ventricular tachycardia: amiodarone 300 mg daily - Dr. Caryl Comes; due to schedule f/u . Current BP readings: 135/62, HR low 70s- notes he has not done a good job writing down readings lately, and was not home to read off other readings to me.  . Weights: home weight 199 lbs, down ~ 5 lbs . Current meal patterns: breakfast: generally skips; 11:30-1:30: tuna fish, frozen meals; supper: grilled chicken, cooked vegetables; 3 16 oz of water, maybe diet mt dew . Discussed minimization of canned/frozen packaged meals due to sodium content. Discussed using non-sodium containing spices when cooking.  . Encouraged to continue to write down home BP readings and send to Dr. Rockey Situ in the next several weeks.  . Discussed  potential for SGLT2 in the future for HF benefit and renal protection. Patient would qualify for patient assistance. Will pass along to Dr. Rockey Situ.   CAD (s/p prior stenting), Hyperlipidemia: . Uncontrolled, consider more aggressive goal of at least <70, if not <55, given hx ASCVD and CKD, DM;  current treatment: atorvastatin 80 mg daily, ezetimibe 10 mg daily . Antiplatelet regimen: aspirin 81 mg daily, clopidogrel 75 mg daily  . Recommended to continue current regimen at this time.   . Could consider transition to Nexlizet (adding bempedoic acid component to current therapy) or addition of PCSK9i in the future. Could pursue [place] hyperlipidemia grant funding  Chronic Obstructive Pulmonary Disease: . Controlled; current treatment: Stiolto 2.5/2.5 mcg, 2 puffs daily; no use of albuterol or nebulizer recently.  . Tobacco free since 10/2019. Praised patient today . Continue current regimen at this time. Praised for continued focus on avoiding tobacco  Anxiety: . Inappropriately controlled; current treatment: alprazolam 0.125 mg up to TID PRN, patient taking BID for anxiety . Continue to reiterate risks of regular benzodiazepine use. Consider transition to SSRI/SNRI to hopefully allow for benzodiazepine wean moving forward.   Patient Goals/Self-Care Activities . Over the next 90 days, patient will:  - take medications as prescribed focus on medication adherence by collaboration to investigate insurance  weigh daily, and contact provider if weight gain of >3 lbs/day or >5 lbs/week collaborate with provider on medication access solutions  Follow Up Plan: Telephone follow up appointment with care management team member scheduled for:~ 8 weeks       Medication Assistance: Delene Loll obtained through [employer] medication assistance program.  Enrollment ends 07/28/21. Stiolto obtained through [employer] medication assistance program.  Enrollment ends 07/28/21.   Patient's preferred  pharmacy is:  [facility] [address] [place] [address] [place] [address] Phone: [phone] Fax: [phone]  [facility] [place], [place] - [address] [place] [address] Phone:  [phone] Fax: [place] and Follow Up Patient Decision:  Patient agrees to Care Plan and Follow-up.  Plan: Telephone follow up appointment with care management team member scheduled for:  ~ 8 weeks  Catie Darnelle Maffucci, PharmD, [place], [place] Clinical Pharmacist [employer] [facility] [employer] [phone]

## 2020-09-21 NOTE — Patient Instructions (Signed)
Visit Information  PATIENT GOALS: Goals Addressed              This Visit's Progress     Patient Stated   .  Medication Monitoring (pt-stated)        Patient Goals/Self-Care Activities . Over the next 90 days, patient will:  - take medications as prescribed focus on medication adherence by collaboration to investigate insurance  weigh daily, and contact provider if weight gain of >3 lbs/day or >5 lbs/week collaborate with provider on medication access solutions        Patient verbalizes understanding of instructions provided today and agrees to view in [place].   Plan: Telephone follow up appointment with care management team member scheduled for:  ~ 8 weeks  Catie Darnelle Maffucci, PharmD, [place], [place] Clinical Pharmacist [employer] [facility] [employer] [phone]

## 2020-09-22 ENCOUNTER — Ambulatory Visit: Admission: RE | Admit: 2020-09-22 | Payer: Medicare Other | Source: Home / Self Care | Admitting: Gastroenterology

## 2020-09-22 ENCOUNTER — Encounter: Admission: RE | Payer: Self-pay | Source: Home / Self Care

## 2020-09-22 SURGERY — COLONOSCOPY WITH PROPOFOL
Anesthesia: General

## 2020-11-07 ENCOUNTER — Ambulatory Visit: Payer: Medicare Other | Admitting: Internal Medicine

## 2020-11-14 ENCOUNTER — Encounter: Payer: Self-pay | Admitting: Internal Medicine

## 2020-11-14 ENCOUNTER — Telehealth (INDEPENDENT_AMBULATORY_CARE_PROVIDER_SITE_OTHER): Payer: Medicare Other | Admitting: Internal Medicine

## 2020-11-14 ENCOUNTER — Other Ambulatory Visit: Payer: Self-pay

## 2020-11-14 VITALS — BP 118/91 | Ht 68.0 in | Wt 200.0 lb

## 2020-11-14 DIAGNOSIS — Z1329 Encounter for screening for other suspected endocrine disorder: Secondary | ICD-10-CM

## 2020-11-14 DIAGNOSIS — E876 Hypokalemia: Secondary | ICD-10-CM | POA: Diagnosis not present

## 2020-11-14 DIAGNOSIS — E785 Hyperlipidemia, unspecified: Secondary | ICD-10-CM | POA: Diagnosis not present

## 2020-11-14 DIAGNOSIS — I5022 Chronic systolic (congestive) heart failure: Secondary | ICD-10-CM

## 2020-11-14 DIAGNOSIS — I10 Essential (primary) hypertension: Secondary | ICD-10-CM | POA: Diagnosis not present

## 2020-11-14 DIAGNOSIS — I25118 Atherosclerotic heart disease of native coronary artery with other forms of angina pectoris: Secondary | ICD-10-CM | POA: Diagnosis not present

## 2020-11-14 DIAGNOSIS — F419 Anxiety disorder, unspecified: Secondary | ICD-10-CM | POA: Diagnosis not present

## 2020-11-14 MED ORDER — EZETIMIBE 10 MG PO TABS: 10.0000 mg | ORAL_TABLET | Freq: Every day | ORAL | 3 refills | Status: DC

## 2020-11-14 MED ORDER — CLOPIDOGREL BISULFATE 75 MG PO TABS: 75.0000 mg | ORAL_TABLET | Freq: Every day | ORAL | 3 refills | Status: DC

## 2020-11-14 MED ORDER — POTASSIUM CHLORIDE ER 10 MEQ PO TBCR: 10.0000 meq | EXTENDED_RELEASE_TABLET | Freq: Every day | ORAL | 3 refills | Status: DC

## 2020-11-14 MED ORDER — CARVEDILOL 3.125 MG PO TABS
3.1250 mg | ORAL_TABLET | Freq: Two times a day (BID) | ORAL | 3 refills | Status: DC
Start: 2020-11-14 — End: 2021-05-02

## 2020-11-14 MED ORDER — ALPRAZOLAM 0.5 MG PO TABS: 0.5000 mg | ORAL_TABLET | Freq: Three times a day (TID) | ORAL | 5 refills | Status: DC | PRN

## 2020-11-14 NOTE — Progress Notes (Signed)
Telephone Note  I connected with Ian Mills  on 11/14/20 at  1:50 PM EDT by telephone and verified that I am speaking with the correct person using two identifiers.  Location patient: home, [place] Location provider:work or home office Persons participating in the virtual visit: patient, provider  I discussed the limitations of evaluation and management by telemedicine and the availability of in person appointments. The patient expressed understanding and agreed to proceed.   HPI: 1. HTN/CAD/chronic sCHF bp sl elevated today had ham for Easter on coreg 3.125 mg bid, lasix 40 bid and entresto 97-103 Will have him do labs with upcoming EP visit 12/19/20   -COVID-19 vaccine status: 3/3  ROS: See pertinent positives and negatives per HPI.  Past Medical History:  Diagnosis Date  . Coronary artery disease    a. 3 stents to the LAD, 1 to the PDA, and 1 to the OM3; b. cath 06/2013: chronically occluded LAD, patent stent LAD, D1 20%, pLCx 30%, mLCx 20%, pRCA 70% (FFR 0.74) s/p PCI/DES, mRCA 70% s/p PCI/DES, RPDA 50%, EF 20% w/ AK of anterolateral & apical walls, moderately elevated LVEDP  . HFrEF (heart failure with reduced ejection fraction) ([place])    a. 05/2014 Echo: EF 25-30%; b. 01/2018 Echo ([facility]): EF 30%.  Marland Kitchen HLD (hyperlipidemia)   . HTN (hypertension)   . ICD  single,BSX    a. DOI 11/2011; b. S/N# 101751  . Ischemic cardiomyopathy    a. 05/2014 Echo: EF 25-30%; b. 01/2018 Echo ([facility]): EF 30%, sev glob HK w/ apical/ant AK. Mild LVH. Mod LAE, mild RAE. Triv MR/TR.  Marland Kitchen Pneumonia    08/2018  . Syncope and collapse   . Ventricular tachycardia ([place]) 11/2011   a. s/p AICD implant     Past Surgical History:  Procedure Laterality Date  . appendectomy    . CARDIAC CATHETERIZATION  Nov 30 2011   [facility]  . CARDIAC DEFIBRILLATOR PLACEMENT  May 2013  . CORONARY ANGIOPLASTY  2014   x 2 stents  . CORONARY STENT PLACEMENT     x7  . LEFT HEART CATH AND CORONARY ANGIOGRAPHY Left 08/30/2019   Procedure:  LEFT HEART CATH AND CORONARY ANGIOGRAPHY;  Surgeon: Wellington Hampshire, MD;  Location: [facility];  Service: Cardiovascular;  Laterality: Left;  . PROSTATE SURGERY     urethra grew around prostate   . TUMOR REMOVAL  2001   chest thymic cyst; behind lungs [facility] benign      Current Outpatient Medications:  .  albuterol (VENTOLIN HFA) 108 (90 Base) MCG/ACT inhaler, Inhale 1 puff into the lungs every 6 (six) hours as needed for wheezing or shortness of breath., Disp: , Rfl:  .  amiodarone (PACERONE) 200 MG tablet, Take 1.5 tablets (300 mg total) by mouth daily., Disp: 135 tablet, Rfl: 3 .  aspirin 81 MG EC tablet, Take 1 tablet (81 mg total) by mouth daily., Disp: 30 tablet, Rfl: 6 .  atorvastatin (LIPITOR) 80 MG tablet, Take 1 tablet (80 mg total) by mouth daily., Disp: 90 tablet, Rfl: 3 .  furosemide (LASIX) 40 MG tablet, Take 1 tablet by mouth twice daily, Disp: 180 tablet, Rfl: 3 .  sacubitril-valsartan (ENTRESTO) 97-103 MG, Take 1 tablet by mouth 2 (two) times daily., Disp: 180 tablet, Rfl: 0 .  tadalafil (CIALIS) 10 MG tablet, Take 1-2 tablets (10-20 mg total) by mouth daily as needed for erectile dysfunction., Disp: 30 tablet, Rfl: 11 .  Tiotropium Bromide-Olodaterol (STIOLTO RESPIMAT) 2.5-2.5 MCG/ACT AERS, Inhale  2 puffs into the lungs daily., Disp: 4 g, Rfl: 0 .  ALPRAZolam (XANAX) 0.5 MG tablet, Take 1 tablet (0.5 mg total) by mouth 3 (three) times daily as needed. for anxiety, Disp: 90 tablet, Rfl: 5 .  carvedilol (COREG) 3.125 MG tablet, Take 1 tablet (3.125 mg total) by mouth 2 (two) times daily., Disp: 180 tablet, Rfl: 3 .  clopidogrel (PLAVIX) 75 MG tablet, Take 1 tablet (75 mg total) by mouth daily., Disp: 90 tablet, Rfl: 3 .  ezetimibe (ZETIA) 10 MG tablet, Take 1 tablet (10 mg total) by mouth daily., Disp: 90 tablet, Rfl: 3 .  ipratropium-albuterol (DUONEB) 0.5-2.5 (3) MG/3ML SOLN, Take 3 mLs by nebulization every 6 (six) hours as needed. (Patient not taking: No sig  reported), Disp: 360 mL, Rfl: 11 .  potassium chloride (KLOR-CON 10) 10 MEQ tablet, Take 1 tablet (10 mEq total) by mouth daily., Disp: 90 tablet, Rfl: 3  EXAM:  VITALS per patient if applicable:  GENERAL: alert, oriented, appears well and in no acute distress  PSYCH/NEURO: pleasant and cooperative, no obvious depression or anxiety, speech and thought processing grossly intact  ASSESSMENT AND PLAN:  Discussed the following assessment and plan:  Essential hypertension sl elevated today h/o chronic sCHF/CAD- Plan: carvedilol (COREG) 3.125 MG tablet, cont entresto 97-103 qd, lasix 40 mg bid Comprehensive metabolic panel, Lipid panel, CBC with Differential/Platelet Cont plavix 75 mg qd, zetia 10 mg qd   Anxiety - Plan: ALPRAZolam (XANAX) 0.5 MG tablet  Hypokalemia - Plan: potassium chloride (KLOR-CON 10) 10 MEQ tablet   HM -fasting labs today 03/03/20 Flu shotutd pna 23 utd  prevnar duein future covid vaccine 2/2 2nd 01/12/20, 06/02/20 pfizer consider booster   Tdaputd Consider shingrix in the future  hcv 06/29/97 negative   Skin no issues  Colonoscopy [facility]  -referred will call back and reschedule as of 11/14/20  PSA sees Dr. Vernice [facility] urology appt sch 05/2020 06/09/20 0.35 -referred [facility] Dr. Diamantina [place] f/u 06/2021   Smoker CT chest 01/19/19 COPD still smoking rec cessation smoking <1 ppd some times <1/2 ppd or 5-6 cig qd since age 65 y.o smoker and quit x 5 year FH lung cancer in mother trying to get help with chantix As of 12/28/19 quit smoking x 5 weeks congratulated  02/16/20 CT chest  IMPRESSION: 1. Lung-RADS 2, benign appearance or behavior. Continue annual screening with low-dose chest CT without contrast in 12 months. 2. Aortic atherosclerosis (ICD10-I70.0). Coronary artery calcification. 3. Emphysema (ICD10-J43.9  12/1/2020[place] cerumen impaction hearing loss with hearing aids improved after wax removal   Cards  Dr. Rockey Situ and EP Dr. Jolyn Nap  -we discussed possible serious and likely etiologies, options for evaluation and workup, limitations of telemedicine visit vs in person visit, treatment, treatment risks and precautions.  Discussed options for inperson care if PCP office not available. Did let this patient know that I only do telemedicine on Tuesdays and Thursdays for [place]. Advised to schedule follow up visit with PCP or UCC if any further questions or concerns to avoid delays in care.   I discussed the assessment and treatment plan with the patient. The patient was provided an opportunity to ask questions and all were answered. The patient agreed with the plan and demonstrated an understanding of the instructions.    Time spent 20 min  Delorise Jackson, MD

## 2020-11-20 ENCOUNTER — Other Ambulatory Visit: Payer: Self-pay

## 2020-11-20 ENCOUNTER — Telehealth: Payer: Self-pay | Admitting: Cardiovascular Disease

## 2020-11-20 DIAGNOSIS — Z1211 Encounter for screening for malignant neoplasm of colon: Secondary | ICD-10-CM

## 2020-11-20 NOTE — Telephone Encounter (Signed)
   [facility] Pre-operative Risk Assessment    Patient Name: Ian Mills  DOB: 08-Mar-1956  MRN: [id number]   HEARTCARE STAFF: - Please ensure there is not already an duplicate clearance open for this procedure. - Under Visit Info/Reason for Call, type in Other and utilize the format Clearance MM/DD/YY or Clearance TBD. Do not use dashes or single digits. - If request is for dental extraction, please clarify the # of teeth to be extracted.  Request for surgical clearance:  1. What type of surgery is being performed? Colonoscopy   2. When is this surgery scheduled? 12/22/20  3. What type of clearance is required (medical clearance vs. Pharmacy clearance to hold med vs. Both)? both  4. Are there any medications that need to be held prior to surgery and how long? Plavix instructions   5. Practice name and name of physician performing surgery? [facility] - Dr Jonathon Bellows  6. What is the office phone number? [phone]   7.   What is the office fax number? [phone]  8.   Anesthesia type (None, local, MAC, general) ? Not listed    Ace Gins 11/20/2020, 2:57 PM  _________________________________________________________________   (provider comments below)

## 2020-11-22 NOTE — Telephone Encounter (Signed)
Note, patient was already cleared for the same procedure in December 2021 and was instructed to hold Plavix for 5 days.  For some reason, he did not go through with the procedure.  I left him a message to call back and speak to the on-call preop APP of the day so we can clear him over the phone again.

## 2020-11-23 NOTE — Telephone Encounter (Signed)
   Primary Cardiologist: Ida Rogue, MD  Chart reviewed as part of pre-operative protocol coverage. Given past medical history and time since last visit, based on ACC/AHA guidelines, Ian Mills would be at acceptable risk for the planned procedure without further cardiovascular testing.   He may hold his Plavix for 5 days prior to his procedure.  Please resume as soon as hemostasis is achieved at the discretion of the surgeon.  Patient was advised that if he develops new symptoms prior to surgery to contact our office to arrange a follow-up appointment.  He verbalized understanding.  I will route this recommendation to the requesting party via Epic fax function and remove from pre-op pool.  Please call with questions.  Jossie Ng. Ramere Downs NP-C    11/23/2020, 12:31 PM [place] [address] Office [phone] Fax [phone]

## 2020-11-24 ENCOUNTER — Telehealth: Payer: Self-pay

## 2020-11-24 NOTE — Telephone Encounter (Signed)
Voice message has been left on pts home phone advising per Dr.Gollan to hold Plavix 5 days prior to colonoscopy.  Resume at the discretion of physician.   Thanks  [place], [place]

## 2020-12-12 ENCOUNTER — Ambulatory Visit (INDEPENDENT_AMBULATORY_CARE_PROVIDER_SITE_OTHER): Payer: Medicare Other

## 2020-12-12 DIAGNOSIS — I255 Ischemic cardiomyopathy: Secondary | ICD-10-CM | POA: Diagnosis not present

## 2020-12-12 LAB — CUP PACEART REMOTE DEVICE CHECK
Battery Remaining Longevity: 54 mo
Battery Remaining Percentage: 54 %
Brady Statistic RV Percent Paced: 0 %
Date Time Interrogation Session: 20220517022100
HighPow Impedance: 92 Ohm
Implantable Lead Implant Date: 20130503
Implantable Lead Location: 753860
Implantable Lead Model: 292
Implantable Lead Serial Number: 112568
Implantable Pulse Generator Implant Date: 20130503
Lead Channel Impedance Value: 624 Ohm
Lead Channel Pacing Threshold Amplitude: 0.8 V
Lead Channel Pacing Threshold Pulse Width: 0.5 ms
Lead Channel Setting Pacing Amplitude: 2.4 V
Lead Channel Setting Pacing Pulse Width: 0.5 ms
Lead Channel Setting Sensing Sensitivity: 0.6 mV
Pulse Gen Serial Number: 105232

## 2020-12-19 ENCOUNTER — Encounter: Payer: Medicare Other | Admitting: Internal Medicine

## 2020-12-20 ENCOUNTER — Ambulatory Visit (INDEPENDENT_AMBULATORY_CARE_PROVIDER_SITE_OTHER): Payer: Medicare Other | Admitting: Pharmacist

## 2020-12-20 DIAGNOSIS — I25118 Atherosclerotic heart disease of native coronary artery with other forms of angina pectoris: Secondary | ICD-10-CM | POA: Diagnosis not present

## 2020-12-20 DIAGNOSIS — I5022 Chronic systolic (congestive) heart failure: Secondary | ICD-10-CM

## 2020-12-20 DIAGNOSIS — I10 Essential (primary) hypertension: Secondary | ICD-10-CM

## 2020-12-20 DIAGNOSIS — E785 Hyperlipidemia, unspecified: Secondary | ICD-10-CM

## 2020-12-20 DIAGNOSIS — J449 Chronic obstructive pulmonary disease, unspecified: Secondary | ICD-10-CM

## 2020-12-20 DIAGNOSIS — F419 Anxiety disorder, unspecified: Secondary | ICD-10-CM

## 2020-12-20 NOTE — Chronic Care Management (AMB) (Signed)
Chronic Care Management Pharmacy Note  12/20/2020 Name:  Ian Mills MRN:  [id number] DOB:  09/08/1955  Subjective: Ian Mills is an 65 y.o. year old male who is a primary patient of McLean-Scocuzza, Nino Glow, MD.  The CCM team was consulted for assistance with disease management and care coordination needs.    Engaged with patient by telephone for follow up visit in response to provider referral for pharmacy case management and/or care coordination services.   Consent to Services:  The patient was given information about Chronic Care Management services, agreed to services, and gave verbal consent prior to initiation of services.  Please see initial visit note for detailed documentation.   Patient Care Team: McLean-Scocuzza, Nino Glow, MD as PCP - General (Internal Medicine) Minna Merritts, MD as PCP - Cardiology (Cardiology) Deboraha Sprang, MD as PCP - Electrophysiology (Cardiology) Minna Merritts, MD as Consulting Physician (Cardiology) De Hollingshead, RPH-CPP as Pharmacist (Pharmacist)  Recent office visits: 4/19 - PCP - continue current regimen; needs labs    Recent consult visits: None  Hospital visits: None in previous 6 months  Objective:  Lab Results  Component Value Date   CREATININE 1.33 (H) 06/09/2020   CREATININE 1.33 (H) 06/09/2020   CREATININE 1.46 (H) 03/03/2020    Lab Results  Component Value Date   HGBA1C 5.4 03/03/2020   Last diabetic Eye exam: No results found for: HMDIABEYEEXA  Last diabetic Foot exam: No results found for: HMDIABFOOTEX      Component Value Date/Time   CHOL 146 03/03/2020 1417   CHOL 170 12/09/2016 1103   CHOL 111 01/20/2014 0929   TRIG 92 03/03/2020 1417   TRIG 88 01/20/2014 0929   HDL 56 03/03/2020 1417   HDL 53 12/09/2016 1103   HDL 39 (L) 01/20/2014 0929   CHOLHDL 2.6 03/03/2020 1417   VLDL 26.6 06/10/2019 0955   VLDL 18 01/20/2014 0929   LDLCALC 72 03/03/2020 1417   LDLCALC 54 01/20/2014 0929     Hepatic Function Latest Ref Rng & Units 03/03/2020 08/23/2019 06/10/2019  Total Protein 6.1 - 8.1 g/dL 7.3 7.1 6.8  Albumin 3.5 - 5.0 g/dL - 3.8 4.1  AST 10 - 35 U/L _0 ALT 9 - 46 U/L 34 41 29  Alk Phosphatase 38 - 126 U/L - 107 117  Total Bilirubin 0.2 - 1.2 mg/dL 0.6 0.8 0.4  Bilirubin, Direct 0.0 - 0.2 mg/dL - 0.1 -    Lab Results  Component Value Date/Time   TSH 1.94 06/10/2019 09:55 AM   TSH 2.080 04/22/2017 10:07 AM    CBC Latest Ref Rng & Units 03/03/2020 08/23/2019 08/20/2019  WBC 3.8 - 10.8 Thousand/uL 6.2 10.4 7.0  Hemoglobin 13.2 - 17.1 g/dL 15.3 14.2 15.1  Hematocrit 38.5 - 50.0 % 45.8 43.9 47.0  Platelets 140 - 400 Thousand/uL 172 204 193    Lab Results  Component Value Date/Time   VD25OH 31.05 06/10/2019 09:55 AM    Clinical ASCVD: Yes  The ASCVD Risk score Mikey Bussing DC Jr., et al., 2013) failed to calculate for the following reasons:   The patient has a prior MI or stroke diagnosis    Social History   Tobacco Use  Smoking Status Former Smoker  . Packs/day: 0.50  . Years: 42.00  . Pack years: 21.00  . Types: Cigarettes  . Quit date: 11/10/2019  . Years since quitting: 1.1  Smokeless Tobacco Never Used   BP Readings from Last  3 Encounters:  11/14/20 (!) 118/91  08/23/20 (!) 163/79  06/29/20 (!) 168/89   Pulse Readings from Last 3 Encounters:  08/23/20 62  06/29/20 60  06/09/20 (!) 52   Wt Readings from Last 3 Encounters:  11/14/20 200 lb (90.7 kg)  08/23/20 198 lb (89.8 kg)  08/08/20 205 lb (93 kg)    Assessment: Review of patient past medical history, allergies, medications, health status, including review of consultants reports, laboratory and other test data, was performed as part of comprehensive evaluation and provision of chronic care management services.   SDOH:  (Social Determinants of Health) assessments and interventions performed:  SDOH Interventions   Flowsheet Row Most Recent Value  SDOH Interventions   Financial Strain  Interventions Other (Comment)  [manufacturer assistance]      CCM Care Plan  Allergies  Allergen Reactions  . Nitroglycerin Nausea Only and Other (See Comments)    Per patient "causes severe  headache"    Medications Reviewed Today    Reviewed by De Hollingshead, RPH-CPP (Pharmacist) on 12/20/20 at 1106  Med List Status: <None>  Medication Order Taking? Sig Documenting Provider Last Dose Status Informant  albuterol (VENTOLIN HFA) 108 (90 Base) MCG/ACT inhaler [id number] Yes Inhale 1 puff into the lungs every 6 (six) hours as needed for wheezing or shortness of breath. [provider] Taking Active            Med Note Darnelle Maffucci, Waynette Buttery Aug 16, 2019  9:11 AM)    ALPRAZolam Duanne Moron) 0.5 MG tablet [id number] Yes Take 1 tablet (0.5 mg total) by mouth 3 (three) times daily as needed. for anxiety McLean-Scocuzza, Nino Glow, MD Taking Active            Med Note Kelby Aline Dec 20, 2020 11:05 AM) Dewaine Conger 2-3 daily  amiodarone (PACERONE) 200 MG tablet [id number] Yes Take 1.5 tablets (300 mg total) by mouth daily. Minna Merritts, MD Taking Active   aspirin 81 MG EC tablet [id number] Yes Take 1 tablet (81 mg total) by mouth daily. Minna Merritts, MD Taking Active Self  atorvastatin (LIPITOR) 80 MG tablet [id number] Yes Take 1 tablet (80 mg total) by mouth daily. McLean-Scocuzza, Nino Glow, MD Taking Active   carvedilol (COREG) 3.125 MG tablet [id number] Yes Take 1 tablet (3.125 mg total) by mouth 2 (two) times daily. McLean-Scocuzza, Nino Glow, MD Taking Active   clopidogrel (PLAVIX) 75 MG tablet [id number] No Take 1 tablet (75 mg total) by mouth daily.  Patient not taking: Reported on 12/20/2020   McLean-Scocuzza, Nino Glow, MD Not Taking Active            Med Note Darnelle Maffucci, Lavonna Rua Dec 20, 2020 11:05 AM) Holding prior to colonoscopy  ezetimibe (ZETIA) 10 MG tablet [id number] Yes Take 1 tablet (10 mg total) by mouth daily. McLean-Scocuzza, Nino Glow, MD Taking Active    furosemide (LASIX) 40 MG tablet [id number] Yes Take 1 tablet by mouth twice daily Gollan, Kathlene November, MD Taking Active            Med Note Darnelle Maffucci, Arville Lime   Thu Sep 21, 2020 11:59 AM) 80 mg QAM  ipratropium-albuterol (DUONEB) 0.5-2.5 (3) MG/3ML SOLN [id number] No Take 3 mLs by nebulization every 6 (six) hours as needed.  Patient not taking: No sig reported   McLean-Scocuzza, Nino Glow, MD Not Taking Active   potassium chloride (KLOR-CON 10) 10 MEQ tablet [id number] Yes Take 1 tablet (  10 mEq total) by mouth daily. McLean-Scocuzza, Nino Glow, MD Taking Active   sacubitril-valsartan [facility]) 97-103 MG [id number] Yes Take 1 tablet by mouth 2 (two) times daily. Theora Gianotti, NP Taking Active   tadalafil (CIALIS) 10 MG tablet [id number] Yes Take 1-2 tablets (10-20 mg total) by mouth daily as needed for erectile dysfunction. Billey Co, MD Taking Active   Tiotropium Bromide-Olodaterol (STIOLTO RESPIMAT) 2.5-2.5 MCG/ACT AERS [id number] Yes Inhale 2 puffs into the lungs daily. Tyler Pita, MD Taking Active Self          Patient Active Problem List   Diagnosis Date Noted  . History of prostate surgery 06/09/2020  . Erectile dysfunction 06/09/2020  . Healthcare maintenance 03/27/2020  . Pain of left heel 03/03/2020  . Obesity (BMI 30-39.9) 03/03/2020  . Aortic atherosclerosis ([place]) 02/20/2020  . 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 Essential hypertension 10/13/2019  . Unstable angina ([place])   . Carotid artery stenosis 06/10/2019  . Baker's cyst 06/10/2019  . Bilateral impacted cerumen 06/10/2019  . Chronic systolic congestive heart failure ([place]) 06/10/2019  . Scoliosis 06/10/2019  . Anxiety 04/15/2019  . Chronic obstructive pulmonary disease ([place]) 04/15/2019  . COPD exacerbation ([place]) 09/08/2018  . Chest tightness 02/12/2018  . Pain and swelling of left lower extremity 02/12/2018  . Sacroiliac pain 07/31/2016  . Acute on chronic systolic CHF  (congestive heart failure) ([place]) 06/27/2016  . Hypokalemia 06/25/2016  . NSTEMI (non-ST elevated myocardial infarction) ([place]) 06/23/2016  . Acute exacerbation of CHF (congestive heart failure) ([place]) 06/22/2016  . DDD (degenerative disc disease), lumbar 06/13/2016  . Tobacco abuse 06/13/2016  . Dizziness 03/30/2015  . Former smoker 09/29/2014  . Syncope 06/01/2014  . Insomnia 06/01/2014  . Ischemic cardiomyopathy   . Ventricular tachycardia ([place]) 01/23/2012  . Single implantable cardioverter-defibrillator-BSx 12/11/2011  . SYNCOPE AND COLLAPSE 09/26/2010  . Hyperlipidemia 02/23/2010  . Hypotension 02/23/2010  . CAD (coronary artery disease) 02/23/2010    Immunization History  Administered Date(s) Administered  . Fluad Quad(high Dose 65+) 06/10/2019, 06/09/2020  . Influenza,inj,Quad PF,6+ Mos 06/26/2016  . PFIZER(Purple Top)SARS-COV-2 Vaccination 12/22/2019, 01/12/2020, 06/02/2020  . Pneumococcal Polysaccharide-23 09/09/2018  . Tdap 03/03/2020    Conditions to be addressed/monitored: CHF, CAD, HTN, HLD and COPD  Care Plan : Medication Management  Updates made by De Hollingshead, RPH-CPP since 12/20/2020 12:00 AM    Problem: CHF, COPD     Long-Range Goal: Disease Progression Prevention   This Visit's Progress: On track  Recent Progress: On track  Priority: High  Note:   Current Barriers:  . Unable to independently afford treatment regimen . Complex patient with multiple comorbidities and low health literacy  Pharmacist Clinical Goal(s):  Marland Kitchen Over the next 90 days, patient will verbalize ability to afford treatment regimen. . Over the next 90 days, patient will adhere to prescribed medication regimen.  Interventions: . 1:1 collaboration with McLean-Scocuzza, Nino Glow, MD regarding development and update of comprehensive plan of care as evidenced by provider attestation and co-signature . Inter-disciplinary care team collaboration (see longitudinal plan of  care) . Comprehensive medication review performed; medication list updated in electronic medical record  Health Maintenance: . Colonoscopy - scheduled for Friday  HFrEF s/p AICD placement (most recent EF 25-30%): Marland Kitchen Appropriately managed; current treatment: Entresto 97/103 mg BID, carvedilol 3.125 mg BID, furosemide 80 mg daily (2 40 mg tabs at once), potassium 10 mEq daily; follows w/ Dr. Rockey Situ . Ventricular tachycardia: amiodarone 300 mg daily - Dr. Caryl Comes;  due to schedule f/u . Current BP readings: 117/78, did not write down HR, but consistently in 60s . Weights: weighing every few days, 198-202 lbs  . Current meal patterns: breakfast: skips some days, other days eats toaster oven cheese bagels; lunch: smaller meal, sometimes skips, generally crackers; supper: grilled chicken, tenderloin, minimizes red meat; fresh vegetables; drink: water, sometimes tea or gatorade  . Current physical activity: more outside lately, likes to kayak and camp with grandkids  . Discussed minimization of canned/frozen packaged meals due to sodium content. Discussed using non-sodium containing spices when cooking.  . Consider SGLT2 moving forward. Patient would qualify for patient assistance.   CAD (s/p prior stenting), Hyperlipidemia: . Uncontrolled, consider more aggressive goal of at least <70, if not <55, given hx ASCVD and CKD, DM;  current treatment: atorvastatin 80 mg daily, ezetimibe 10 mg daily . Antiplatelet regimen: aspirin 81 mg daily, clopidogrel 75 mg daily - holding prior to colonoscopy . Recommended to continue current regimen at this time.   . Could consider transition to Nexlizet (adding bempedoic acid component to current therapy) or addition of PCSK9i in the future. Could pursue [place] hyperlipidemia grant funding . Due for lab work. Patient requested to have labs checked at [facility] lab in [place]. Will collaborate w/ provider on this.   Chronic Obstructive Pulmonary  Disease: . Controlled; current treatment: Stiolto 2.5/2.5 mcg, 2 puffs daily; no use of albuterol or nebulizer recently.  . Tobacco free since 10/2019. Praised for continued abstinence.  . Continue current regimen at this time.   Anxiety: . Inappropriately controlled; current treatment: alprazolam 0.125 mg up to TID PRN, patient taking BID-TID for anxiety . Continue to reiterate risks of regular benzodiazepine use.  . Consider addition to SSRI/SNRI to hopefully allow for benzodiazepine wean moving forward.   Patient Goals/Self-Care Activities . Over the next 90 days, patient will:  - take medications as prescribed focus on medication adherence by collaboration to investigate insurance  weigh daily, and contact provider if weight gain of >3 lbs/day or >5 lbs/week collaborate with provider on medication access solutions  Follow Up Plan: Telephone follow up appointment with care management team member scheduled for:~ 12 weeks      Medication Assistance: Delene Loll obtained through [employer] medication assistance program.  Enrollment ends 07/28/21. Stiolto obtained through [employer] medication assistance program.  Enrollment ends 07/28/21  Patient's preferred pharmacy is:  [facility] [address] [place] - [place] [address] [place] [address] Phone: [phone] Fax: [phone]  [facility], [place] [address] [place] [address] Phone: [phone] Fax: [phone]    Follow Up:  Patient agrees to Care Plan and Follow-up.  Plan: Telephone follow up appointment with care management team member scheduled for:  ~ 12 weeks  Catie Darnelle Maffucci, PharmD, [place], [place] Clinical Pharmacist [employer] [facility] [employer] [phone]

## 2020-12-20 NOTE — Patient Instructions (Signed)
Visit Information  PATIENT GOALS: Goals Addressed              This Visit's Progress     Patient Stated   .  Medication Monitoring (pt-stated)        Patient Goals/Self-Care Activities . Over the next 90 days, patient will:  - take medications as prescribed focus on medication adherence by collaboration to investigate insurance  weigh daily, and contact provider if weight gain of >3 lbs/day or >5 lbs/week collaborate with provider on medication access solutions       Patient verbalizes understanding of instructions provided today and agrees to view in [place].    Plan: Telephone follow up appointment with care management team member scheduled for:  ~ 12 weeks  Catie Darnelle Maffucci, PharmD, [place], [place] Clinical Pharmacist [employer] [facility] [employer] [phone]

## 2020-12-22 ENCOUNTER — Ambulatory Visit
Admission: RE | Admit: 2020-12-22 | Discharge: 2020-12-22 | Disposition: A | Discharge status: Home / Self Care | Payer: Medicare Other | Attending: Gastroenterology | Admitting: Gastroenterology

## 2020-12-22 ENCOUNTER — Ambulatory Visit: Payer: Medicare Other | Admitting: Anesthesiology

## 2020-12-22 ENCOUNTER — Encounter: Payer: Self-pay | Admitting: Gastroenterology

## 2020-12-22 ENCOUNTER — Encounter
Admission: RE | Disposition: A | Discharge status: Home / Self Care | Payer: Self-pay | Source: Home / Self Care | Attending: Gastroenterology

## 2020-12-22 DIAGNOSIS — E785 Hyperlipidemia, unspecified: Secondary | ICD-10-CM | POA: Diagnosis not present

## 2020-12-22 DIAGNOSIS — D127 Benign neoplasm of rectosigmoid junction: Secondary | ICD-10-CM | POA: Diagnosis not present

## 2020-12-22 DIAGNOSIS — Z79899 Other long term (current) drug therapy: Secondary | ICD-10-CM | POA: Insufficient documentation

## 2020-12-22 DIAGNOSIS — D120 Benign neoplasm of cecum: Secondary | ICD-10-CM | POA: Insufficient documentation

## 2020-12-22 DIAGNOSIS — D122 Benign neoplasm of ascending colon: Secondary | ICD-10-CM | POA: Insufficient documentation

## 2020-12-22 DIAGNOSIS — Z87891 Personal history of nicotine dependence: Secondary | ICD-10-CM | POA: Diagnosis not present

## 2020-12-22 DIAGNOSIS — Z7982 Long term (current) use of aspirin: Secondary | ICD-10-CM | POA: Diagnosis not present

## 2020-12-22 DIAGNOSIS — Z7902 Long term (current) use of antithrombotics/antiplatelets: Secondary | ICD-10-CM | POA: Insufficient documentation

## 2020-12-22 DIAGNOSIS — K635 Polyp of colon: Secondary | ICD-10-CM

## 2020-12-22 DIAGNOSIS — Z955 Presence of coronary angioplasty implant and graft: Secondary | ICD-10-CM | POA: Diagnosis not present

## 2020-12-22 DIAGNOSIS — Z95810 Presence of automatic (implantable) cardiac defibrillator: Secondary | ICD-10-CM | POA: Diagnosis not present

## 2020-12-22 DIAGNOSIS — Z1211 Encounter for screening for malignant neoplasm of colon: Secondary | ICD-10-CM | POA: Diagnosis not present

## 2020-12-22 DIAGNOSIS — D125 Benign neoplasm of sigmoid colon: Secondary | ICD-10-CM | POA: Diagnosis not present

## 2020-12-22 DIAGNOSIS — Z888 Allergy status to other drugs, medicaments and biological substances status: Secondary | ICD-10-CM | POA: Diagnosis not present

## 2020-12-22 DIAGNOSIS — D123 Benign neoplasm of transverse colon: Secondary | ICD-10-CM | POA: Diagnosis not present

## 2020-12-22 DIAGNOSIS — D124 Benign neoplasm of descending colon: Secondary | ICD-10-CM | POA: Insufficient documentation

## 2020-12-22 HISTORY — PX: COLONOSCOPY WITH PROPOFOL: SHX5780

## 2020-12-22 SURGERY — COLONOSCOPY WITH PROPOFOL
Anesthesia: General

## 2020-12-22 MED ORDER — SODIUM CHLORIDE 0.9 % IV SOLN
INTRAVENOUS | Status: DC
  Administered 2020-12-22: 1000 mL via INTRAVENOUS

## 2020-12-22 MED ORDER — LIDOCAINE HCL (CARDIAC) PF 100 MG/5ML IV SOSY
PREFILLED_SYRINGE | INTRAVENOUS | Status: DC | PRN
  Administered 2020-12-22: 100 mg via INTRAVENOUS

## 2020-12-22 MED ORDER — PHENYLEPHRINE HCL (PRESSORS) 10 MG/ML IV SOLN
INTRAVENOUS | Status: DC | PRN
  Administered 2020-12-22 (×4): 100 ug via INTRAVENOUS

## 2020-12-22 MED ORDER — PROPOFOL 10 MG/ML IV BOLUS
INTRAVENOUS | Status: DC | PRN
  Administered 2020-12-22: 50 mg via INTRAVENOUS

## 2020-12-22 MED ORDER — GLYCOPYRROLATE 0.2 MG/ML IJ SOLN
INTRAMUSCULAR | Status: DC | PRN
  Administered 2020-12-22 (×2): .2 mg via INTRAVENOUS

## 2020-12-22 MED ORDER — EPHEDRINE SULFATE 50 MG/ML IJ SOLN
INTRAMUSCULAR | Status: DC | PRN
  Administered 2020-12-22 (×2): 10 mg via INTRAVENOUS

## 2020-12-22 MED ORDER — SODIUM CHLORIDE (PF) 0.9 % IJ SOLN
INTRAMUSCULAR | Status: DC | PRN
  Administered 2020-12-22: 5 mL

## 2020-12-22 MED ORDER — PROPOFOL 500 MG/50ML IV EMUL
INTRAVENOUS | Status: DC | PRN
  Administered 2020-12-22: 135 ug/kg/min via INTRAVENOUS

## 2020-12-22 NOTE — Op Note (Addendum)
[facility] Gastroenterology Patient Name: Ian Mills Procedure Date: 12/22/2020 8:22 AM MRN: [id number] Account #: [id number] Date of Birth: Aug 13, 1955 Admit Type: Outpatient Age: 65 Room: [facility] ENDO ROOM 4 Gender: Male Note Status: Finalized Procedure:             Colonoscopy Indications:           Screening for colorectal malignant neoplasm Providers:             Jonathon Bellows MD, MD Referring MD:          Minna Merritts, MD (Referring MD) Medicines:             Monitored Anesthesia Care Complications:         No immediate complications. Procedure:             Pre-Anesthesia Assessment:                        - Prior to the procedure, a History and Physical was                         performed, and patient medications, allergies and                         sensitivities were reviewed. The patient's tolerance                         of previous anesthesia was reviewed.                        - The risks and benefits of the procedure and the                         sedation options and risks were discussed with the                         patient. All questions were answered and informed                         consent was obtained.                        - ASA Grade Assessment: III - A patient with severe                         systemic disease.                        After obtaining informed consent, the colonoscope was                         passed under direct vision. Throughout the procedure,                         the patient's blood pressure, pulse, and oxygen                         saturations were monitored continuously. The                         Colonoscope was introduced through the anus  and                         advanced to the the cecum, identified by the                         appendiceal orifice. The colonoscopy was performed                         with ease. The patient tolerated the procedure well.                         The quality  of the bowel preparation was excellent. Findings:      The perianal and digital rectal examinations were normal.      A 25 mm polyp was found in the recto-sigmoid colon. The polyp was       semi-pedunculated. The polyp was removed with a saline injection-lift       technique using a hot snare. Resection and retrieval were complete. To       prevent bleeding after the polypectomy, one hemostatic clip was       successfully placed. There was no bleeding during, or at the end, of the       procedure.      Two sessile polyps were found in the recto-sigmoid colon and cecum. The       polyps were 5 to 7 mm in size. These polyps were removed with a cold       snare. Resection and retrieval were complete.      Four sessile polyps were found in the ascending colon. The polyps were 5       to 7 mm in size. These polyps were removed with a cold snare. Resection       and retrieval were complete.      Two sessile polyps were found in the transverse colon. The polyps were 5       to 6 mm in size. These polyps were removed with a cold snare. Resection       and retrieval were complete.      Two sessile polyps were found in the descending colon. The polyps were 4       to 5 mm in size. These polyps were removed with a cold snare. Resection       and retrieval were complete.      Five sessile polyps were found in the sigmoid colon. The polyps were 4       to 6 mm in size. These polyps were removed with a cold snare. Resection       and retrieval were complete.      The exam was otherwise without abnormality on direct and retroflexion       views. Impression:            - One 25 mm polyp at the recto-sigmoid colon, removed                         using injection-lift and a hot snare. Resected and                         retrieved. Clip was placed.                        -  Two 5 to 7 mm polyps at the recto-sigmoid colon and                         in the cecum, removed with a cold snare. Resected and                          retrieved.                        - Four 5 to 7 mm polyps in the ascending colon,                         removed with a cold snare. Resected and retrieved.                        - Two 5 to 6 mm polyps in the transverse colon,                         removed with a cold snare. Resected and retrieved.                        - Two 4 to 5 mm polyps in the descending colon,                         removed with a cold snare. Resected and retrieved.                        - Five 4 to 6 mm polyps in the sigmoid colon, removed                         with a cold snare. Resected and retrieved.                        - The examination was otherwise normal on direct and                         retroflexion views. Recommendation:        - Discharge patient to home (with escort).                        - Resume previous diet.                        - Continue present medications.                        - Await pathology results.                        - Repeat colonoscopy in 1 year for surveillance. Procedure Code(s):     --- Professional ---                        [phone], Colonoscopy, flexible; with removal of                         tumor(s), polyp(s), or other lesion(s) by snare  technique                        [id number], Colonoscopy, flexible; with directed submucosal                         injection(s), any substance Diagnosis Code(s):     --- Professional ---                        K63.5, Polyp of colon                        Z12.11, Encounter for screening for malignant neoplasm                         of colon CPT copyright 2019 American Medical Association. All rights reserved. The codes documented in this report are preliminary and upon coder review may  be revised to meet current compliance requirements. Jonathon Bellows, MD Jonathon Bellows MD, MD 12/22/2020 9:08:46 AM This report has been signed electronically. Number of Addenda: 0 Note Initiated On:  12/22/2020 8:22 AM Scope Withdrawal Time: 0 hours 29 minutes 1 second  Total Procedure Duration: 0 hours 31 minutes 43 seconds  Estimated Blood Loss:  Estimated blood loss: none.      [facility]

## 2020-12-22 NOTE — H&P (Signed)
Jonathon Bellows, MD [address], [place], [place], [place], [address] [address], [place], [place], [place], [address] Phone: [phone]  Fax: [phone]  Primary Care Physician:  McLean-Scocuzza, Nino Glow, MD   Pre-Procedure History & Physical: HPI:  Ian Mills is a 65 y.o. male is here for an colonoscopy.   Past Medical History:  Diagnosis Date  . Coronary artery disease    a. 3 stents to the LAD, 1 to the PDA, and 1 to the OM3; b. cath 06/2013: chronically occluded LAD, patent stent LAD, D1 20%, pLCx 30%, mLCx 20%, pRCA 70% (FFR 0.74) s/p PCI/DES, mRCA 70% s/p PCI/DES, RPDA 50%, EF 20% w/ AK of anterolateral & apical walls, moderately elevated LVEDP  . HFrEF (heart failure with reduced ejection fraction) ([place])    a. 05/2014 Echo: EF 25-30%; b. 01/2018 Echo ([facility]): EF 30%.  Marland Kitchen HLD (hyperlipidemia)   . HTN (hypertension)   . ICD  single,BSX    a. DOI 11/2011; b. S/N# [id number]  . Ischemic cardiomyopathy    a. 05/2014 Echo: EF 25-30%; b. 01/2018 Echo ([facility]): EF 30%, sev glob HK w/ apical/ant AK. Mild LVH. Mod LAE, mild RAE. Triv MR/TR.  Marland Kitchen Pneumonia    08/2018  . Syncope and collapse   . Ventricular tachycardia ([place]) 11/2011   a. s/p AICD implant     Past Surgical History:  Procedure Laterality Date  . appendectomy    . CARDIAC CATHETERIZATION  Nov 30 2011   [facility]  . CARDIAC DEFIBRILLATOR PLACEMENT  May 2013  . CORONARY ANGIOPLASTY  2014   x 2 stents  . CORONARY STENT PLACEMENT     x7  . LEFT HEART CATH AND CORONARY ANGIOGRAPHY Left 08/30/2019   Procedure: LEFT HEART CATH AND CORONARY ANGIOGRAPHY;  Surgeon: Wellington Hampshire, MD;  Location: [facility];  Service: Cardiovascular;  Laterality: Left;  . PROSTATE SURGERY     urethra grew around prostate   . TUMOR REMOVAL  2001   chest thymic cyst; behind lungs [facility] benign     Prior to Admission medications   Medication Sig Start Date End Date Taking? Authorizing Provider  albuterol (VENTOLIN HFA) 108 (90  Base) MCG/ACT inhaler Inhale 1 puff into the lungs every 6 (six) hours as needed for wheezing or shortness of breath.   Yes [provider]  ALPRAZolam (XANAX) 0.5 MG tablet Take 1 tablet (0.5 mg total) by mouth 3 (three) times daily as needed. for anxiety 11/14/20  Yes McLean-Scocuzza, Nino Glow, MD  amiodarone (PACERONE) 200 MG tablet Take 1.5 tablets (300 mg total) by mouth daily. 01/27/20  Yes Minna Merritts, MD  aspirin 81 MG EC tablet Take 1 tablet (81 mg total) by mouth daily. 09/11/15  Yes Minna Merritts, MD  atorvastatin (LIPITOR) 80 MG tablet Take 1 tablet (80 mg total) by mouth daily. 06/09/20  Yes McLean-Scocuzza, Nino Glow, MD  carvedilol (COREG) 3.125 MG tablet Take 1 tablet (3.125 mg total) by mouth 2 (two) times daily. 11/14/20  Yes McLean-Scocuzza, Nino Glow, MD  clopidogrel (PLAVIX) 75 MG tablet Take 1 tablet (75 mg total) by mouth daily. 11/14/20  Yes McLean-Scocuzza, Nino Glow, MD  ezetimibe (ZETIA) 10 MG tablet Take 1 tablet (10 mg total) by mouth daily. 11/14/20  Yes McLean-Scocuzza, Nino Glow, MD  furosemide (LASIX) 40 MG tablet Take 1 tablet by mouth twice daily 07/05/20  Yes Gollan, Kathlene November, MD  potassium chloride (KLOR-CON 10) 10 MEQ tablet Take  1 tablet (10 mEq total) by mouth daily. 11/14/20  Yes McLean-Scocuzza, Nino Glow, MD  sacubitril-valsartan (ENTRESTO) 97-103 MG Take 1 tablet by mouth 2 (two) times daily. 05/25/20  Yes Theora Gianotti, NP  tadalafil (CIALIS) 10 MG tablet Take 1-2 tablets (10-20 mg total) by mouth daily as needed for erectile dysfunction. 06/29/20  Yes Billey Co, MD  Tiotropium Bromide-Olodaterol (STIOLTO RESPIMAT) 2.5-2.5 MCG/ACT AERS Inhale 2 puffs into the lungs daily. 08/12/19  Yes Tyler Pita, MD  ipratropium-albuterol (DUONEB) 0.5-2.5 (3) MG/3ML SOLN Take 3 mLs by nebulization every 6 (six) hours as needed. Patient not taking: No sig reported 08/08/20   McLean-Scocuzza, Nino Glow, MD    Allergies as of 11/20/2020 - Review  Complete 11/14/2020  Allergen Reaction Noted  . Nitroglycerin Nausea Only and Other (See Comments) 01/23/2012    Family History  Problem Relation Age of Onset  . Cancer Father        larynx  . Cancer - Lung Mother   . Hypertension Mother   . Heart attack Mother   . Dementia Maternal Grandmother   . Heart attack Paternal Grandfather   . Colon cancer Neg Hx   . Stomach cancer Neg Hx   . Pancreatic cancer Neg Hx     Social History   Socioeconomic History  . Marital status: Married    Spouse name: Hilda Blades   . Number of children: 2  . Years of education: Not on file  . Highest education level: Not on file  Occupational History  . Occupation: retired   Tobacco Use  . Smoking status: Former Smoker    Packs/day: 0.50    Years: 42.00    Pack years: 21.00    Types: Cigarettes    Quit date: 11/10/2019    Years since quitting: 1.1  . Smokeless tobacco: Never Used  Vaping Use  . Vaping Use: Never used  Substance and Sexual Activity  . Alcohol use: Yes    Alcohol/week: 8.0 standard drinks    Types: 8 Cans of beer per week    Comment: per week  . Drug use: No  . Sexual activity: Not on file  Other Topics Concern  . 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 Difficulty of Paying Living Expenses: Somewhat hard  Food Insecurity: Not on file  Transportation Needs: Not on file  Physical Activity: Not on file  Stress: Stress Concern Present  . Feeling of Stress : Rather much  Social Connections: Not on file  Intimate Partner Violence: Not on file    Review of Systems: See HPI, otherwise negative ROS  Physical Exam: BP (!) 122/59   Pulse (!) 56   Temp (!) 96.5 F (35.8 C) (Temporal)   Resp 18    Ht 5\' 7"  (1.702 m)   Wt 88.5 kg   SpO2 100%   BMI 30.54 kg/m  General:   Alert,  pleasant and cooperative in NAD Head:  Normocephalic and atraumatic. Neck:  Supple; no masses or thyromegaly. Lungs:  Clear throughout to auscultation, normal respiratory effort.    Heart:  +S1, +S2, Regular rate and rhythm, No  edema. Abdomen:  Soft, nontender and nondistended. Normal bowel sounds, without guarding, and without rebound.   Neurologic:  Alert and  oriented x4;  grossly normal neurologically.  Impression/Plan: NISHAWN ROTAN is here for an colonoscopy to be performed for Screening colonoscopy average risk   Risks, benefits, limitations, and alternatives regarding  colonoscopy have been reviewed with the patient.  Questions have been answered.  All parties agreeable.   Jonathon Bellows, MD  12/22/2020, 8:20 AM

## 2020-12-22 NOTE — Anesthesia Preprocedure Evaluation (Addendum)
Anesthesia Evaluation  Patient identified by MRN, date of birth, ID band Patient awake    Reviewed: Allergy & Precautions, NPO status , Patient's Chart, lab work & pertinent test results  Airway Mallampati: II  TM Distance: >3 FB Neck ROM: Full    Dental  (+) Poor Dentition, Chipped   Pulmonary pneumonia, resolved, COPD, former smoker,    Pulmonary exam normal        Cardiovascular hypertension, + angina + CAD, + Past MI, + Cardiac Stents, + Peripheral Vascular Disease and +CHF  Normal cardiovascular exam+ Cardiac Defibrillator      Neuro/Psych Anxiety negative neurological ROS  negative psych ROS   GI/Hepatic negative GI ROS, Neg liver ROS,   Endo/Other  negative endocrine ROS  Renal/GU negative Renal ROS  negative genitourinary   Musculoskeletal  (+) Arthritis , Osteoarthritis,    Abdominal   Peds negative pediatric ROS (+)  Hematology negative hematology ROS (+)   Anesthesia Other Findings  History of prostate surgery 06/09/2020 . Erectile dysfunction 06/09/2020 . Healthcare maintenance 03/27/2020 . Pain of left heel 03/03/2020 . Obesity (BMI 30-39.9) 03/03/2020 . Aortic atherosclerosis ([place]) 02/20/2020 . 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 Essential hypertension 10/13/2019 . Unstable angina ([place])  . Carotid artery stenosis 06/10/2019 . Baker's cyst 06/10/2019 . Bilateral impacted cerumen 06/10/2019 . Chronic systolic congestive heart failure ([place]) 06/10/2019 . Scoliosis 06/10/2019 . Anxiety 04/15/2019 . Chronic obstructive pulmonary disease ([place]) 04/15/2019 . COPD exacerbation ([place]) 09/08/2018 . Chest tightness 02/12/2018 . Pain and swelling of left lower extremity 02/12/2018 . Sacroiliac pain 07/31/2016 . Acute on chronic systolic CHF (congestive heart failure) ([place]) 06/27/2016 . Hypokalemia 06/25/2016 . NSTEMI (non-ST elevated myocardial infarction)  ([place]) 06/23/2016 . Acute exacerbation of CHF (congestive heart failure) ([place]) 06/22/2016 . DDD (degenerative disc disease), lumbar 06/13/2016 . Tobacco abuse 06/13/2016 . Dizziness 03/30/2015 . Former smoker 09/29/2014 . Syncope 06/01/2014 . Insomnia 06/01/2014 . Ischemic cardiomyopathy  . Ventricular tachycardia ([place]) 01/23/2012 . Single implantable cardioverter-defibrillator-BSx 12/11/2011 . SYNCOPE AND COLLAPSE 09/26/2010 . Hyperlipidemia 02/23/2010 . Hypotension 02/23/2010 . CAD (coronary artery disease) 02/23/2010    Reproductive/Obstetrics negative OB ROS                            Anesthesia Physical Anesthesia Plan  ASA: IV  Anesthesia Plan: General   Post-op Pain Management:    Induction: Intravenous  PONV Risk Score and Plan: 2 and Propofol infusion and TIVA  Airway Management Planned: Natural Airway and Nasal Cannula  Additional Equipment:   Intra-op Plan:   Post-operative Plan:   Informed Consent: I have reviewed the patients History and Physical, chart, labs and discussed the procedure including the risks, benefits and alternatives for the proposed anesthesia with the patient or authorized representative who has indicated his/her understanding and acceptance.       Plan Discussed with: CRNA, Anesthesiologist and Surgeon  Anesthesia Plan Comments:         Anesthesia Quick Evaluation

## 2020-12-22 NOTE — Anesthesia Procedure Notes (Signed)
Procedure Name: General with mask airway Performed by: Fletcher-Harrison, Kayani Rapaport, CRNA Pre-anesthesia Checklist: Patient identified, Emergency Drugs available, Suction available and Patient being monitored Patient Re-evaluated:Patient Re-evaluated prior to induction Oxygen Delivery Method: Simple face mask Induction Type: IV induction Placement Confirmation: positive ETCO2 and CO2 detector Dental Injury: Teeth and Oropharynx as per pre-operative assessment        

## 2020-12-22 NOTE — Anesthesia Postprocedure Evaluation (Signed)
Anesthesia Post Note  Patient: Ian Mills  Procedure(s) Performed: COLONOSCOPY WITH PROPOFOL (N/A )  Patient location during evaluation: Phase II Anesthesia Type: General Level of consciousness: awake and alert, awake and oriented Pain management: pain level controlled Vital Signs Assessment: post-procedure vital signs reviewed and stable Respiratory status: spontaneous breathing, nonlabored ventilation and respiratory function stable Cardiovascular status: blood pressure returned to baseline and stable Postop Assessment: no apparent nausea or vomiting Anesthetic complications: no   No complications documented.   Last Vitals:  Vitals:   12/22/20 0918 12/22/20 0928  BP: 97/60 112/61  Pulse: 63 62  Resp: 19 (!) 26  Temp:    SpO2: 99% 94%    Last Pain:  Vitals:   12/22/20 0928  TempSrc:   PainSc: 0-No pain                 Phill Mutter

## 2020-12-22 NOTE — Transfer of Care (Signed)
Immediate Anesthesia Transfer of Care Note  Patient: [name]  Procedure(s) Performed: COLONOSCOPY WITH PROPOFOL (N/A )  Patient Location: Endoscopy Unit  Anesthesia Type:General  Level of Consciousness: drowsy and patient cooperative  Airway & Oxygen Therapy: Patient Spontanous Breathing and Patient connected to face mask oxygen  Post-op Assessment: Report given to RN and Post -op Vital signs reviewed and stable  Post vital signs: Reviewed and stable  Last Vitals:  Vitals Value Taken Time  BP 131/63 12/22/20 0909  Temp 35.7 C 12/22/20 0908  Pulse 58 12/22/20 0911  Resp 17 12/22/20 0911  SpO2 100 % 12/22/20 0911  Vitals shown include unvalidated device data.  Last Pain:  Vitals:   12/22/20 0908  TempSrc: Temporal  PainSc: Asleep         Complications: No complications documented.

## 2020-12-25 ENCOUNTER — Encounter: Payer: Self-pay | Admitting: Gastroenterology

## 2020-12-28 LAB — SURGICAL PATHOLOGY

## 2020-12-29 NOTE — Addendum Note (Signed)
Addended by: Orland Mustard on: 12/29/2020 05:31 PM   Modules accepted: Orders

## 2020-12-29 NOTE — Progress Notes (Signed)
Labs ordered [facility] can go to [facility] fasting for these [address]  Fasting x 8-12 hours

## 2021-01-04 NOTE — Progress Notes (Signed)
Remote ICD transmission.   

## 2021-01-09 DIAGNOSIS — I509 Heart failure, unspecified: Secondary | ICD-10-CM | POA: Diagnosis not present

## 2021-01-09 DIAGNOSIS — Z87891 Personal history of nicotine dependence: Secondary | ICD-10-CM | POA: Diagnosis not present

## 2021-01-09 DIAGNOSIS — B9689 Other specified bacterial agents as the cause of diseases classified elsewhere: Secondary | ICD-10-CM | POA: Diagnosis not present

## 2021-01-09 DIAGNOSIS — Z79899 Other long term (current) drug therapy: Secondary | ICD-10-CM | POA: Diagnosis not present

## 2021-01-09 DIAGNOSIS — S99922A Unspecified injury of left foot, initial encounter: Secondary | ICD-10-CM | POA: Diagnosis not present

## 2021-01-09 DIAGNOSIS — R600 Localized edema: Secondary | ICD-10-CM | POA: Diagnosis not present

## 2021-01-09 DIAGNOSIS — I110 Hypertensive heart disease with heart failure: Secondary | ICD-10-CM | POA: Diagnosis not present

## 2021-01-09 DIAGNOSIS — I2510 Atherosclerotic heart disease of native coronary artery without angina pectoris: Secondary | ICD-10-CM | POA: Diagnosis not present

## 2021-01-09 DIAGNOSIS — L03116 Cellulitis of left lower limb: Secondary | ICD-10-CM | POA: Diagnosis not present

## 2021-01-09 DIAGNOSIS — I252 Old myocardial infarction: Secondary | ICD-10-CM | POA: Diagnosis not present

## 2021-01-09 DIAGNOSIS — M79672 Pain in left foot: Secondary | ICD-10-CM | POA: Diagnosis not present

## 2021-01-09 DIAGNOSIS — J449 Chronic obstructive pulmonary disease, unspecified: Secondary | ICD-10-CM | POA: Diagnosis not present

## 2021-01-09 DIAGNOSIS — Z7982 Long term (current) use of aspirin: Secondary | ICD-10-CM | POA: Diagnosis not present

## 2021-01-10 ENCOUNTER — Telehealth: Payer: Self-pay

## 2021-01-10 ENCOUNTER — Encounter: Payer: Self-pay | Admitting: Gastroenterology

## 2021-01-10 DIAGNOSIS — Z86010 Personal history of colonic polyps: Secondary | ICD-10-CM

## 2021-01-10 NOTE — Telephone Encounter (Signed)
Called patient to let him know the below information. Patient agreed on going to the [facility] for the genetic counseling.I also told him that I would put him in our recall list in a year so we could schedule him a repeat colonoscopy. Patient agreed. Referral sent to the [facility] today. They will contact the patient.

## 2021-01-10 NOTE — Telephone Encounter (Signed)
-----   Message from Jonathon Bellows, MD sent at 01/10/2021  8:17 AM EDT ----- Herb Grays   Please inform that all the polyps resected which were 16 in total were all precancerous.  Inform repeat colonoscopy in 1 year.  Inform that I would strongly recommend referral to the cancer center for genetic testing to determine why he has so many polyps.  If he does have a syndrome that would affect how often we screen him and his first-degree relatives as well.  If he has any questions please set up a video visit.  C/c McLean-Scocuzza, Nino Glow, MD (FYI)  Dr Jonathon Bellows MD,MRCP [facility]) Gastroenterology/Hepatology Pager: [phone]

## 2021-01-17 ENCOUNTER — Telehealth: Payer: Self-pay | Admitting: Licensed Clinical Social Worker

## 2021-01-17 NOTE — Telephone Encounter (Signed)
Called patient's wife to try and reschedule patient's appointment for 6/23, left voicemail with my phone number and requested call back.

## 2021-01-18 ENCOUNTER — Inpatient Hospital Stay: Payer: Medicare Other | Admitting: Licensed Clinical Social Worker

## 2021-01-18 ENCOUNTER — Inpatient Hospital Stay: Payer: Medicare Other

## 2021-01-23 ENCOUNTER — Encounter: Payer: Self-pay | Admitting: Licensed Clinical Social Worker

## 2021-01-23 ENCOUNTER — Inpatient Hospital Stay: Payer: Medicare Other | Attending: Oncology | Admitting: Licensed Clinical Social Worker

## 2021-01-23 ENCOUNTER — Inpatient Hospital Stay: Payer: Medicare Other

## 2021-01-23 DIAGNOSIS — Z860100 Personal history of colon polyps, unspecified: Secondary | ICD-10-CM | POA: Insufficient documentation

## 2021-01-23 DIAGNOSIS — Z801 Family history of malignant neoplasm of trachea, bronchus and lung: Secondary | ICD-10-CM

## 2021-01-23 NOTE — Progress Notes (Signed)
REFERRING PROVIDER: Jonathon Bellows, MD [address] [place],  [place] [address]  PRIMARY PROVIDER:  McLean-Scocuzza, Nino Glow, MD  PRIMARY REASON FOR VISIT:  1. Personal history of colonic polyps   2. Family history of lung cancer      HISTORY OF PRESENT ILLNESS:   Ian Mills, a 65 y.o. male, was seen for a [facility] cancer genetics consultation at the request of Dr. Vicente Males due to a personal history of colon polyps.  Ian Mills presents to clinic today to discuss the possibility of a hereditary predisposition to cancer, genetic testing, and to further clarify his future cancer risks, as well as potential cancer risks for family members.   Ian Mills is a 65 y.o. male with no personal history of cancer.  He had his first colonoscopy many years ago and does not remember if polyps were found. He had a colonoscopy this year that found 16 polyps, most of which were tubular adenomas with some sessile serrated and hyperplastic polyps as well. He is followed by urology for PSA, no other cancer screenings.   CANCER HISTORY:  Oncology History   No history exists.     Past Medical History:  Diagnosis Date   Coronary artery disease    a. 3 stents to the LAD, 1 to the PDA, and 1 to the OM3; b. cath 06/2013: chronically occluded LAD, patent stent LAD, D1 20%, pLCx 30%, mLCx 20%, pRCA 70% (FFR 0.74) s/p PCI/DES, mRCA 70% s/p PCI/DES, RPDA 50%, EF 20% w/ AK of anterolateral & apical walls, moderately elevated LVEDP   Family history of lung cancer    HFrEF (heart failure with reduced ejection fraction) ([place])    a. 05/2014 Echo: EF 25-30%; b. 01/2018 Echo ([facility]): EF 30%.   HLD (hyperlipidemia)    HTN (hypertension)    ICD  single,BSX    a. DOI 11/2011; b. S/N# [id number]   Ischemic cardiomyopathy    a. 05/2014 Echo: EF 25-30%; b. 01/2018 Echo ([facility]): EF 30%, sev glob HK w/ apical/ant AK. Mild LVH. Mod LAE, mild RAE. Triv MR/TR.   Personal history of colonic polyps    Pneumonia    08/2018    Syncope and collapse    Ventricular tachycardia ([place]) 11/2011   a. s/p AICD implant     Past Surgical History:  Procedure Laterality Date   appendectomy     CARDIAC CATHETERIZATION  Nov 30 2011   [facility]   CARDIAC DEFIBRILLATOR PLACEMENT  May 2013   COLONOSCOPY WITH PROPOFOL N/A 12/22/2020   Procedure: COLONOSCOPY WITH PROPOFOL;  Surgeon: Jonathon Bellows, MD;  [facility];  Service: Gastroenterology;  Laterality: N/A;   CORONARY ANGIOPLASTY  2014   x 2 stents   CORONARY STENT PLACEMENT     x7   LEFT HEART CATH AND CORONARY ANGIOGRAPHY Left 08/30/2019   Procedure: LEFT HEART CATH AND CORONARY ANGIOGRAPHY;  Surgeon: Wellington Hampshire, MD;  Location: [facility];  Service: Cardiovascular;  Laterality: Left;   PROSTATE SURGERY     urethra grew around prostate    TUMOR REMOVAL  2001   chest thymic cyst; behind lungs [facility] benign     Social History   Socioeconomic History   Marital status: Married    Spouse name: Hilda Blades    Number of children: 2   Years of education: Not on file   Highest education level: Not on file  Occupational History   Occupation: retired   Tobacco Use   Smoking  status: Former    Packs/day: 0.50    Years: 42.00    Pack years: 21.00    Types: Cigarettes    Quit date: 11/10/2019    Years since quitting: 1.2   Smokeless tobacco: Never  Vaping Use   Vaping Use: Never used  Substance and Sexual Activity   Alcohol use: Yes    Alcohol/week: 8.0 standard drinks    Types: 8 Cans of beer per week    Comment: per week   Drug use: No   Sexual activity: Not on file  Other Topics Concern   Not on file  Social History Narrative   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  Significant diagnoses are listed below: Family History  Problem Relation Age of Onset   Cancer - Lung Mother    Hypertension Mother    Heart attack Mother    Cancer Father        larynx   Dementia Maternal Grandmother    Heart attack Paternal Grandfather    Colon cancer Neg Hx    Stomach cancer Neg Hx    Pancreatic cancer Neg Hx    Ian Mills has 2 daughters, no cancers or polyps he is aware of. Patient does not have siblings.  Ian Mills mother had lung cancer and died in her 70s. He has very limited information about his maternal side, no cancers or polyps that he is aware of.  Ian Mills father died at 59 and had larynx cancer. Patient had 3 paternal aunts, none had cancer that he is aware of. He does not have paternal first cousins. Paternal grandmother died in her 33s due to a car accident and grandfather died at 48 due to a heart attack.   Ian Mills is unaware of previous family history of genetic testing for hereditary cancer risks. Patient's maternal ancestors are of American Indian/unknown descent, and paternal ancestors are of Irish/unknown descent. There is no reported Ashkenazi Jewish ancestry. There is no known consanguinity.    GENETIC COUNSELING ASSESSMENT: Ian Mills is a 65 y.o. male with a personal history of colon polyps which is somewhat suggestive of a hereditary cancer syndrome and predisposition to cancer. We, therefore, discussed and recommended the following at today's visit.   DISCUSSION: We discussed that polyps in general are common, however, most people have fewer than 5 lifetime polyps.  When an individual has 10 or more polyps we become concerned about an underlying  polyposis syndrome.  The most common hereditary polyposis syndromes are caused by problems in the APC and MUTYH genes, however, more recently, mutations in the [place] and MSH3 genes have been identified in some polyposis families. We discussed that testing is beneficial for several reasons including knowing how to follow individuals for cancer screenings, and understand if other family members could be at risk for cancer  and allow them to undergo genetic testing.   We reviewed the characteristics, features and inheritance patterns of hereditary cancer syndromes. We also discussed genetic testing, including the appropriate family members to test, the process of testing, insurance coverage and turn-around-time for results. We discussed the implications of a negative, positive and/or variant of uncertain significant result. We recommended Mr. Labine pursue genetic testing for the [facility] Common Hereditary Cancer+Colorectal Cancer gene panel.   Based on Mr. Overfield's personal history of polyps he meets medical criteria for genetic testing. Despite that he meets criteria, he may still have an out of pocket cost. We discussed that if his out of pocket cost for testing is over $100, the laboratory will call and confirm whether he wants to proceed with testing.  If the out of pocket cost of testing is less than $100 he will be billed by the genetic testing laboratory.   PLAN: After considering the risks, benefits, and limitations, Mr. Cordell provided informed consent to pursue genetic testing and the blood sample was sent to [facility] for analysis of the Common Hereditary Cancers Panel + Colorectal panel. Results should be available within approximately 2-3 weeks' time, at which point they will be disclosed by telephone to Mr. Giles, as will any additional recommendations warranted by these results. Mr. Bellin will receive a summary of his genetic counseling visit and a copy of his results once available.  This information will also be available in Epic.   Mr. Monestime questions were answered to his satisfaction today. Our contact information was provided should additional questions or concerns arise. Thank you for the referral and allowing [place] to share in the care of your patient.   Faith Rogue, MS, [facility] Genetic Counselor [email or web address] Phone: [phone]  The patient was seen for a total of 25 minutes in face-to-face genetic counseling.  Patient brought his wife Hilda Blades. Dr. Grayland Ormond was available for discussion regarding this case.   _______________________________________________________________________ For Office Staff:  Number of people involved in session: 2 Was an Intern/ student involved with case: no

## 2021-01-26 DIAGNOSIS — Z20822 Contact with and (suspected) exposure to covid-19: Secondary | ICD-10-CM | POA: Diagnosis not present

## 2021-02-02 ENCOUNTER — Telehealth: Payer: Self-pay | Admitting: Licensed Clinical Social Worker

## 2021-02-02 ENCOUNTER — Encounter: Payer: Self-pay | Admitting: Licensed Clinical Social Worker

## 2021-02-02 ENCOUNTER — Ambulatory Visit: Payer: Self-pay | Admitting: Licensed Clinical Social Worker

## 2021-02-02 DIAGNOSIS — Z86010 Personal history of colonic polyps: Secondary | ICD-10-CM

## 2021-02-02 DIAGNOSIS — Z801 Family history of malignant neoplasm of trachea, bronchus and lung: Secondary | ICD-10-CM

## 2021-02-02 DIAGNOSIS — Z1379 Encounter for other screening for genetic and chromosomal anomalies: Secondary | ICD-10-CM

## 2021-02-02 NOTE — Progress Notes (Signed)
HPI:  [name] was previously seen in the [facility] due to a personal history of colon polyps and concerns regarding a hereditary predisposition to cancer. Please refer to our prior cancer genetics clinic note for more information regarding our discussion, assessment and recommendations, at the time. [name] recent genetic test results were disclosed to him, as were recommendations warranted by these results. These results and recommendations are discussed in more detail below.  CANCER HISTORY:  Oncology History   No history exists.    FAMILY HISTORY:  We obtained a detailed, 4-generation family history.  Significant diagnoses are listed below: Family History  Problem Relation Age of Onset   Cancer - Lung Mother    Hypertension Mother    Heart attack Mother    Cancer Father        larynx   Dementia Maternal Grandmother    Heart attack Paternal Grandfather    Colon cancer Neg Hx    Stomach cancer Neg Hx    Pancreatic cancer Neg Hx     [name] has 2 daughters, no cancers or polyps he is aware of. Patient does not have siblings.   [name] mother had lung cancer and died in her 33s. He has very limited information about his maternal side, no cancers or polyps that he is aware of.   [name] father died at 24 and had larynx cancer. Patient had 3 paternal aunts, none had cancer that he is aware of. He does not have paternal first cousins. Paternal grandmother died in her 45s due to a car accident and grandfather died at 71 due to a heart attack.    [name] is unaware of previous family history of genetic testing for hereditary cancer risks. Patient's maternal ancestors are of American Indian/unknown descent, and paternal ancestors are of Irish/unknown descent. There is no reported Ashkenazi Jewish ancestry. There is no known consanguinity.     GENETIC TEST RESULTS: Genetic testing reported out on 01/31/2021 through the [facility] Common Hereditary  cancer panel found no pathogenic mutations.   The Common Hereditary Cancers Panel offered by [facility] includes sequencing and/or deletion duplication testing of the following 56 genes: APC*, ATM*, AXIN2, BARD1, BLM, BMPR1A, BRCA1, BRCA2, BRIP1, BUB1B, CDH1, CDK4, CDKN2A (p14ARF), CDKN2A (p16INK4a), CEP57*, CHEK2, CTNNA1, DICER1*, ENG*, EPCAM*, FLCN, GALNT12, GREM1*, HOXB13, KIT, MEN1*, MLH1*, MLH3*, MSH2*, MSH3*, MSH6*, MUTYH, NBN, NF1*, NTHL1, PALB2, PDGFRA, PMS2*, POLD1*, POLE, PTEN*, RAD50, RAD51C, RAD51D, RNF43, RPS20, SDHA*, SDHB, SDHC*, SDHD, SMAD4, SMARCA4, STK11, TP53, TSC1*, TSC2, VHL.   The test report has been scanned into EPIC and is located under the Molecular Pathology section of the Results Review tab.  A portion of the result report is included below for reference.     We discussed that because current genetic testing is not perfect, it is possible there may be a gene mutation in one of these genes that current testing cannot detect, but that chance is small.  There could be another gene that has not yet been discovered, or that we have not yet tested, that is responsible for the cancer diagnoses in the family. It is also possible there is a hereditary cause for the cancer in the family that [name] did not inherit and therefore was not identified in his testing.  Therefore, it is important to remain in touch with cancer genetics in the future so that we can continue to offer [name] the most up to date genetic testing.   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 At this time, it is unknown if this variant is associated with increased cancer risk or if this is a normal finding, but most variants such as this get reclassified to being inconsequential. It should not be used to make medical management decisions. With time, we suspect the lab will determine the significance of this variant, if any. If we do learn more about it we will try to  contact [name] to discuss it further. However, it is important to stay in touch with [place] periodically and keep the address and phone number up to date.  ADDITIONAL GENETIC TESTING: We discussed with [name] that his genetic testing was fairly extensive.  If there are genes identified to increase cancer risk that can be analyzed in the future, we would be happy to discuss and coordinate this testing at that time.    CANCER SCREENING RECOMMENDATIONS: [name] test result is considered negative (normal).  This means that we have not identified a hereditary cause for his family history of cancer at this time.  While reassuring, this does not definitively rule out a hereditary predisposition to cancer. It is still possible that there could be genetic mutations that are undetectable by current technology. There could be genetic mutations in genes that have not been tested or identified to increase cancer risk.  Therefore, it is recommended he continue to follow the cancer management and screening guidelines provided by his primary healthcare provider.   An individual's cancer risk and medical management are not determined by genetic test results alone. Overall cancer risk assessment incorporates additional factors, including personal medical history, family history, and any available genetic information that may result in a personalized plan for cancer prevention and surveillance.  This negative genetic test simply tells [place] that we cannot yet define why [name] has had an increased number of colorectal polyps. [name] medical management and screening should be based on the prospect that he will likely form more colon polyps and should, therefore, undergo more frequent colonoscopy screening at intervals determined by his GI providers.  We also recommended that [name] have an upper endoscopy periodically.  RECOMMENDATIONS FOR FAMILY MEMBERS:  Relatives in this family might be at some  increased risk of developing cancer, over the general population risk, simply due to the family history of cancer.  We recommended male relatives in this family have a yearly mammogram beginning at age 71, or 49 years younger than the earliest onset of cancer, an annual clinical breast exam, and perform monthly breast self-exams. Male relatives in this family should also have a gynecological exam as recommended by their primary provider.  All family members should be referred for colonoscopy starting at age 17.   FOLLOW-UP: Lastly, we discussed with [name] that cancer genetics is a rapidly advancing field and it is possible that new genetic tests will be appropriate for him and/or his family members in the future. We encouraged him to remain in contact with cancer genetics on an annual basis so we can update his personal and family histories and let him know of advances in cancer genetics that may benefit this family.   Our contact number was provided. [name] questions were answered to his satisfaction, and he knows he is welcome to call us at anytime with additional questions or concerns.   Faith Rogue, MS, [facility] Genetic Counselor [email or web address] Phone: [phone]

## 2021-02-02 NOTE — Telephone Encounter (Signed)
Revealed negative genetic testing.  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  We discussed that we do not know why he has colon polyps or why there is cancer in the family. It could be due to a different gene that we are not testing, or something our current technology cannot pick up.  It will be important for him to keep in contact with genetics to learn if additional testing may be needed in the future.

## 2021-02-13 ENCOUNTER — Other Ambulatory Visit: Payer: Self-pay | Admitting: Cardiovascular Disease

## 2021-02-13 ENCOUNTER — Other Ambulatory Visit: Payer: Self-pay | Admitting: Internal Medicine

## 2021-02-13 DIAGNOSIS — E785 Hyperlipidemia, unspecified: Secondary | ICD-10-CM

## 2021-02-13 NOTE — Telephone Encounter (Signed)
Please schedule 6 month F/U appointment with Dr. Gollan. Thank you! 

## 2021-02-13 NOTE — Telephone Encounter (Signed)
LMOV to schedule  

## 2021-02-14 ENCOUNTER — Encounter: Payer: Self-pay | Admitting: Internal Medicine

## 2021-02-14 ENCOUNTER — Ambulatory Visit (INDEPENDENT_AMBULATORY_CARE_PROVIDER_SITE_OTHER): Payer: Medicare Other

## 2021-02-14 ENCOUNTER — Encounter: Payer: Self-pay | Admitting: Podiatry

## 2021-02-14 ENCOUNTER — Ambulatory Visit (INDEPENDENT_AMBULATORY_CARE_PROVIDER_SITE_OTHER): Payer: Medicare Other | Admitting: Internal Medicine

## 2021-02-14 ENCOUNTER — Ambulatory Visit (INDEPENDENT_AMBULATORY_CARE_PROVIDER_SITE_OTHER): Payer: Medicare Other | Admitting: Podiatry

## 2021-02-14 ENCOUNTER — Other Ambulatory Visit: Payer: Self-pay

## 2021-02-14 VITALS — BP 134/82 | HR 61 | Temp 97.9°F | Ht 67.0 in | Wt 205.2 lb

## 2021-02-14 DIAGNOSIS — Z1329 Encounter for screening for other suspected endocrine disorder: Secondary | ICD-10-CM | POA: Diagnosis not present

## 2021-02-14 DIAGNOSIS — R7303 Prediabetes: Secondary | ICD-10-CM | POA: Diagnosis not present

## 2021-02-14 DIAGNOSIS — M79672 Pain in left foot: Secondary | ICD-10-CM | POA: Diagnosis not present

## 2021-02-14 DIAGNOSIS — S90852A Superficial foreign body, left foot, initial encounter: Secondary | ICD-10-CM

## 2021-02-14 DIAGNOSIS — S90852D Superficial foreign body, left foot, subsequent encounter: Secondary | ICD-10-CM

## 2021-02-14 DIAGNOSIS — F419 Anxiety disorder, unspecified: Secondary | ICD-10-CM

## 2021-02-14 DIAGNOSIS — E782 Mixed hyperlipidemia: Secondary | ICD-10-CM | POA: Diagnosis not present

## 2021-02-14 DIAGNOSIS — I10 Essential (primary) hypertension: Secondary | ICD-10-CM | POA: Diagnosis not present

## 2021-02-14 DIAGNOSIS — I5023 Acute on chronic systolic (congestive) heart failure: Secondary | ICD-10-CM | POA: Diagnosis not present

## 2021-02-14 MED ORDER — ALPRAZOLAM 0.5 MG PO TABS: 0.5000 mg | ORAL_TABLET | Freq: Three times a day (TID) | ORAL | 5 refills | Status: DC | PRN

## 2021-02-14 MED ORDER — ATORVASTATIN CALCIUM 80 MG PO TABS: 80.0000 mg | ORAL_TABLET | Freq: Every day | ORAL | 3 refills | Status: DC

## 2021-02-14 NOTE — Progress Notes (Addendum)
Chief Complaint  Patient presents with   Follow-up   F/u  1. Left foot inner sole of foot near great toe pain stepped on kabob skewer 12/2020 and broke off in foot pain 6/10 he did go to ED in [place] and had imaging with dye will get ROI today pain still present and still feels pain with walking and something stuck in foot  He was on Abx did not complete them ? Name could complete due to side effects upset stomach  Review of Systems  Respiratory:  Negative for shortness of breath.   Cardiovascular:  Negative for chest pain.  Musculoskeletal:  Positive for joint pain.  Past Medical History:  Diagnosis Date   Coronary artery disease    a. 3 stents to the LAD, 1 to the PDA, and 1 to the OM3; b. cath 06/2013: chronically occluded LAD, patent stent LAD, D1 20%, pLCx 30%, mLCx 20%, pRCA 70% (FFR 0.74) s/p PCI/DES, mRCA 70% s/p PCI/DES, RPDA 50%, EF 20% w/ AK of anterolateral & apical walls, moderately elevated LVEDP   Family history of lung cancer    HFrEF (heart failure with reduced ejection fraction) ([place])    a. 05/2014 Echo: EF 25-30%; b. 01/2018 Echo ([facility]): EF 30%.   HLD (hyperlipidemia)    HTN (hypertension)    ICD  single,BSX    a. DOI 11/2011; b. S/N# [id number]   Ischemic cardiomyopathy    a. 05/2014 Echo: EF 25-30%; b. 01/2018 Echo ([facility]): EF 30%, sev glob HK w/ apical/ant AK. Mild LVH. Mod LAE, mild RAE. Triv MR/TR.   Personal history of colonic polyps    Pneumonia    08/2018   Syncope and collapse    Ventricular tachycardia ([place]) 11/2011   a. s/p AICD implant    Past Surgical History:  Procedure Laterality Date   appendectomy     CARDIAC CATHETERIZATION  Nov 30 2011   [facility]   CARDIAC DEFIBRILLATOR PLACEMENT  May 2013   COLONOSCOPY WITH PROPOFOL N/A 12/22/2020   Procedure: COLONOSCOPY WITH PROPOFOL;  Surgeon: Jonathon Bellows, MD;  Location: [facility];  Service: Gastroenterology;  Laterality: N/A;   CORONARY ANGIOPLASTY  2014   x 2 stents   CORONARY STENT PLACEMENT     x7    LEFT HEART CATH AND CORONARY ANGIOGRAPHY Left 08/30/2019   Procedure: LEFT HEART CATH AND CORONARY ANGIOGRAPHY;  Surgeon: Wellington Hampshire, MD;  Location: [facility] [facility];  Service: Cardiovascular;  Laterality: Left;   PROSTATE SURGERY     urethra grew around prostate    TUMOR REMOVAL  2001   chest thymic cyst; behind lungs [facility] benign    Family History  Problem Relation Age of Onset   Cancer - Lung Mother    Hypertension Mother    Heart attack Mother    Cancer Father        larynx   Dementia Maternal Grandmother    Heart attack Paternal Grandfather    Colon cancer Neg Hx    Stomach cancer Neg Hx    Pancreatic cancer Neg Hx    Social History   Socioeconomic History   Marital status: Married    Spouse name: Hilda Blades    Number of children: 2   Years of education: Not on file   Highest education level: Not on file  Occupational History   Occupation: retired   Tobacco Use   Smoking status: Former    Packs/day: 0.50    Years: 42.00    Pack years: 21.00  Types: Cigarettes    Quit date: 11/10/2019    Years since quitting: 1.2   Smokeless tobacco: Never  Vaping Use   Vaping Use: Never used  Substance and Sexual Activity   Alcohol use: Yes    Alcohol/week: 8.0 standard drinks    Types: 8 Cans of beer per week    Comment: per week   Drug use: No   Sexual activity: Not on file  Other Topics Concern   Not on file  Social History Narrative   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    Lives in [place]    2 kids daughters    Gets regular exercise   Smoker   [employer]    Social Determinants of Health   Financial Resource Strain: Medium Risk   Difficulty of Paying Living Expenses: Somewhat hard  Food Insecurity: Not on file  Transportation Needs: Not on file  Physical Activity: Not on file  Stress: Stress Concern Present   Feeling of Stress : Rather much  Social Connections: Not on file   Intimate Partner Violence: Not on file   Current Meds  Medication Sig   albuterol (VENTOLIN HFA) 108 (90 Base) MCG/ACT inhaler Inhale 1 puff into the lungs every 6 (six) hours as needed for wheezing or shortness of breath.   ALPRAZolam (XANAX) 0.5 MG tablet Take 1 tablet (0.5 mg total) by mouth 3 (three) times daily as needed. for anxiety   amiodarone (PACERONE) 200 MG tablet Take 1.5 tablets (300 mg total) by mouth daily.   aspirin 81 MG EC tablet Take 1 tablet (81 mg total) by mouth daily.   atorvastatin (LIPITOR) 80 MG tablet Take 1 tablet (80 mg total) by mouth daily.   carvedilol (COREG) 3.125 MG tablet Take 1 tablet (3.125 mg total) by mouth 2 (two) times daily.   clopidogrel (PLAVIX) 75 MG tablet Take 1 tablet (75 mg total) by mouth daily.   ezetimibe (ZETIA) 10 MG tablet Take 1 tablet (10 mg total) by mouth daily.   furosemide (LASIX) 40 MG tablet Take 1 tablet by mouth twice daily   potassium chloride (KLOR-CON 10) 10 MEQ tablet Take 1 tablet (10 mEq total) by mouth daily.   sacubitril-valsartan (ENTRESTO) 97-103 MG Take 1 tablet by mouth 2 (two) times daily.   tadalafil (CIALIS) 10 MG tablet Take 1-2 tablets (10-20 mg total) by mouth daily as needed for erectile dysfunction.   Tiotropium Bromide-Olodaterol (STIOLTO RESPIMAT) 2.5-2.5 MCG/ACT AERS Inhale 2 puffs into the lungs daily.   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    Pulse Gen Serial Number [id number]    Clinic Name [facility] Pulse Generator Type Implantable Cardiac Defibulator    Implantable Pulse Generator Implant Date [id number]     Implantable Lead Manufacturer BOST    Implantable Lead Model 0292 [place] Reliance 4-Site SG    Implantable Lead Serial Number [id number]    Implantable Lead Implant Date [id number]    Implantable Lead Location  952841    Lead Channel Setting Sensing Sensitivity 0.6 mV   Lead Channel Setting Sensing Adaptation Mode Adaptive Sensing    Lead Channel Setting Pacing Pulse Width 0.5 ms   Lead Channel Setting Pacing Amplitude 2.4 V   Lead Channel Impedance Value 624 ohm   Lead Channel Pacing Threshold Amplitude 0.8 V   Lead Channel Pacing Threshold Pulse Width 0.5 ms   HighPow Impedance 92 ohm   Battery Status BOS    Battery Remaining Longevity 54 mo   Battery Remaining Percentage 54 %   Brady Statistic RV Percent Paced 0 %  Surgical pathology     Status: None   Collection Time: 12/22/20  8:36 AM  Result Value Ref Range   SURGICAL PATHOLOGY      SURGICAL PATHOLOGY CASE: [id number] PATIENT: [name] Surgical Pathology Report     Specimen Submitted: A. Colon polyp, cecum; cold snare B. Colon polyp x4, ascending; cold snare C. Colon polyp x2, transverse; cold snare D. Colon polyp x2, descending; cold snare E. Colon polyp x5, sigmoid; cold snare F. Colon polyp x2, rectosigmoid; hot snare  Clinical History: Screening colonoscopy.  Colon polyps    DIAGNOSIS: A. COLON POLYP, CECUM; COLD SNARE: - TUBULAR ADENOMA. - NEGATIVE FOR HIGH-GRADE DYSPLASIA AND MALIGNANCY.  B. COLON POLYPS X4, ASCENDING; COLD SNARE: - TUBULAR ADENOMA (MULTIPLE FRAGMENTS). - SESSILE SERRATED POLYP (MULTIPLE FRAGMENTS). - NEGATIVE FOR HIGH-GRADE DYSPLASIA AND MALIGNANCY.  C. COLON POLYPS X2, TRANSVERSE; COLD SNARE: - TUBULAR ADENOMA (MULTIPLE FRAGMENTS). - NEGATIVE FOR HIGH-GRADE DYSPLASIA AND MALIGNANCY.  D. COLON POLYPS X2, DESCENDING; COLD SNARE: - TUBULAR ADENOMA (MULTIPLE FRAGMENTS). - NEGATIVE FOR HIGH-GRADE DYSPL ASIA AND MALIGNANCY.  E. COLON POLYPS X5, SIGMOID; COLD SNARE: - TUBULAR  ADENOMA (MULTIPLE FRAGMENTS). - SESSILE SERRATED POLYP (MULTIPLE FRAGMENTS). - HYPERPLASTIC POLYP. - NEGATIVE FOR HIGH-GRADE DYSPLASIA AND MALIGNANCY.  F. COLON POLYPS X2, RECTOSIGMOID; HOT SNARE: - TUBULOVILLOUS ADENOMA (LARGER FRAGMENT) WITH EVIDENCE OF TORSION, DEEPER SECTIONS EXAMINED. - TUBULAR ADENOMA (SMALLER FRAGMENT). - NEGATIVE FOR HIGH-GRADE DYSPLASIA AND MALIGNANCY.  GROSS DESCRIPTION: A. Labeled: Cold snare polyp cecum Received: Formalin Collection time: 8:36 AM on 12/22/2020 Placed into formalin time: 8:36 AM on 12/22/2020 Tissue fragment(s): Multiple Size: Aggregate, 1.5 x 0.6 x 0.1 cm Description: Received are fragments of tan-pink soft tissue admixed with intestinal debris.  The ratio of soft tissue to intestinal debris is 60: 40. Entirely submitted in 1 cassette.  B. Labeled: Cold snare polyp ascending colon x4 Received: Formalin Collection time: 8:38 AM on 12/22/2020 Pl aced into formalin time: 8:38 AM on 12/22/2020 Tissue fragment(s): Multiple Size: Aggregate, 2 x 0.6 x 0.2 cm Description: Received are fragments of tan-pink soft tissue admixed with intestinal debris.  The ratio of soft tissue to intestinal debris is 80: 20. Entirely submitted in 1 cassette.  C. Labeled: Cold snare polyp transverse colon x2 Received: Formalin Collection time: 8:42 AM on 12/22/2020 Placed into formalin time: 8:42 AM on 12/22/2020 Tissue fragment(s): Multiple Size: Aggregate, 2 x 0.6 x 0.2 cm Description: Received are fragments of tan-pink soft tissue admixed with intestinal debris.  The ratio of soft tissue to intestinal debris is 70: 30. Entirely submitted in 1 cassette.  D. Labeled: Cold snare polyp ascending colon x2 Received: Formalin Collection time: 8:50 AM on 12/22/2020 Placed into formalin time: 8:50 AM on 12/22/2020 Tissue fragment(s): Multiple Size: Aggregate, 0.9 x 0.8 x 0.2 cm Description: Received are fragments of tan-pink soft tissue a dmixed with intestinal  debris.  The ratio of soft tissue  to intestinal debris is 80: 20. Entirely submitted in 1 cassette.  E. Labeled: Cold snare polyp sigmoid colon x5 Received: Formalin Collection time: 8:52 AM on 12/22/2020 Placed into formalin time: 8:52 AM on 12/22/2020 Tissue fragment(s): Multiple Size: Aggregate, 1.7 x 1.1 x 0.2 cm Description: Received are fragments of tan-pink soft tissue admixed with intestinal debris.  The ratio of soft tissue to intestinal debris is 95.5. Entirely submitted in 1 cassette.  F. Labeled: Hot snare polyp rectosigmoid colon x2 Received: Formalin Collection time: 8:58 AM on 12/22/2020 Placed into formalin time: 8:58 AM on 12/22/2020 Tissue fragment(s): 2 Size: Range from 0.7-2.3 cm Description: Received are fragments of pink to red lobulated soft tissue.  Both fragments have presumed resection margins which are differentially inked.  The smaller fragment is bisected and the larger fragment is serially sectioned. Entirely su bmitted in cassettes 1-4 with the smaller bisected fragment in cassette 1 and the larger serially sectioned fragment in cassettes 2-4.  RB 12/22/2020   Final Diagnosis performed by Quay Burow, MD.   Electronically signed 12/28/2020 1:56:58PM The electronic signature indicates that the named Attending Pathologist has evaluated the specimen Technical component performed at [facility], [address] [place], [place] [address] Lab: [phone] Dir: Rush Farmer, MD, MMM  Professional component performed at [facility], [facility], [place], [place], [place] [address] Lab: [phone] Dir: Dellia Nims. Rubinas, MD    Objective  Body mass index is 32.14 kg/m. Wt Readings from Last 3 Encounters:  02/14/21 205 lb 3.2 oz (93.1 kg)  12/22/20 195 lb (88.5 kg)  11/14/20 200 lb (90.7 kg)   Temp Readings from Last 3 Encounters:  02/14/21 97.9 F (36.6 C) (Oral)  12/22/20 (!) 96.2 F (35.7 C) (Temporal)  06/09/20 97.7 F  (36.5 C) (Oral)   BP Readings from Last 3 Encounters:  02/14/21 134/82  12/22/20 116/66  11/14/20 (!) 118/91   Pulse Readings from Last 3 Encounters:  02/14/21 61  12/22/20 (!) 58  08/23/20 62    Physical Exam Vitals and nursing note reviewed.  Constitutional:      Appearance: Normal appearance. He is well-developed and well-groomed.  HENT:     Head: Normocephalic and atraumatic.  Eyes:     Conjunctiva/sclera: Conjunctivae normal.     Pupils: Pupils are equal, round, and reactive to light.  Cardiovascular:     Rate and Rhythm: Normal rate and regular rhythm.     Heart sounds: Normal heart sounds. No murmur heard. Pulmonary:     Effort: Pulmonary effort is normal.     Breath sounds: Normal breath sounds.  Musculoskeletal:       Feet:  Skin:    General: Skin is warm and dry.  Neurological:     General: No focal deficit present.     Mental Status: He is alert and oriented to person, place, and time. Mental status is at baseline.     Gait: Gait normal.  Psychiatric:        Attention and Perception: Attention and perception normal.        Mood and Affect: Mood and affect normal.        Speech: Speech normal.        Behavior: Behavior normal. Behavior is cooperative.        Thought Content: Thought content normal.        Cognition and Memory: Cognition and memory normal.        Judgment: Judgment normal.    Assessment  Plan  Foreign body in left foot, subsequent encounter Pain of left heel Refer podiatry appt today at 2:45  May need Abx Had clindamycin and cipro 01/09/21 per Ed [facility]man ct left foot with edema and Xray neg  Mixed hyperlipidemia Acute on chronic systolic CHF (congestive heart failure) ([place]) - Plan: Comprehensive metabolic panel, Lipid panel, CBC with Differential/Platelet, Magnesium Essential hypertension - Plan: Comprehensive metabolic panel, Lipid panel, CBC with Differential/Platelet Cont meds    Prediabetes - Plan: Hemoglobin A1c   Anxiety   On prn xanax 0.5 tid prn declines SSRI today   HM -fasting labs Flu shot utd  pna 23 utd prevnar due in future covid vaccine 2/2 2nd 01/12/20, 06/02/20 pfizer consider booster   Tdap utd Consider shingrix in the future    hcv 06/29/97 negative   Skin no issues   Colonoscopy [facility]  -5/22 and 6/22 egd colonoscopy   PSA sees Dr. Vernice [facility] urology appt sch 05/2020 06/09/20 0.35 -referred [facility] Dr. Diamantina Providence f/u 06/2021    Smoker CT chest 01/19/19 COPD still smoking rec cessation   smoking <1 ppd some times <1/2 ppd or 5-6 cig qd since age 52 y.o smoker and quit x 5 year FH lung cancer in mother trying to get help with chantix  As of 12/28/19 quit smoking x 5 weeks congratulated    02/16/20 CT chest  IMPRESSION: 1. Lung-RADS 2, benign appearance or behavior. Continue annual screening with low-dose chest CT without contrast in 12 months. 2. Aortic atherosclerosis (ICD10-I70.0). Coronary artery calcification. 3.  Emphysema (ICD10-J43.9   06/29/2019 [facility] cerumen impaction hearing loss with hearing aids improved after wax removal      Cards Dr. Rockey Situ and EP Dr. Jolyn Nap    Provider: Dr. Olivia Mackie McLean-Scocuzza-Internal Medicine

## 2021-02-14 NOTE — Progress Notes (Signed)
  Subjective:  Patient ID: Ian Mills, male    DOB: Jul 31, 1955,  MRN: [id number]  Chief Complaint  Patient presents with   Foot Injury     NP -[place] OBJECT IN LEFT FOOT    65 y.o. male presents with the above complaint. History confirmed with patient.  He stepped on a wooden skewer about 6 weeks ago and does not think it came out, he went to the ER but they were unable to do anything for him.  Objective:  Physical Exam: warm, good capillary refill, no trophic changes or ulcerative lesions, normal DP and PT pulses, and normal sensory exam. Left Foot: Plantar medial MTPJ there is a palpable firmness and small area of callus tissue  Radiographs: Multiple views x-ray of the left foot: Skin lesion marker in position over the area of concern there is no identifiable foreign body on the x-ray Assessment:   1. Foreign body in left foot, initial encounter      Plan:  Patient was evaluated and treated and all questions answered.  I attempted to debride the callus and see if there is any residual puncture wound that I can express the foreign body from.  Was not able to do this.  I recommend we evaluate with advanced imaging with an MRI to plan for surgical excision.  He has an implantable pacemaker so this will be ordered from [facility] radiology.  Follow-up after MRI for surgical planning  Return for after MRI to review, will call to schedule .

## 2021-02-14 NOTE — Patient Instructions (Signed)
[facility]

## 2021-02-14 NOTE — Patient Instructions (Addendum)
[address]  [phone]   Turn left [address]  Turn right on [place] [address]  Turn left on [address]  Right right [facility]

## 2021-02-14 NOTE — Addendum Note (Signed)
Addended by: Orland Mustard on: 02/14/2021 01:50 PM   Modules accepted: Orders

## 2021-02-15 ENCOUNTER — Other Ambulatory Visit: Payer: Self-pay | Admitting: *Deleted

## 2021-02-15 ENCOUNTER — Other Ambulatory Visit: Payer: Self-pay | Admitting: Internal Medicine

## 2021-02-15 ENCOUNTER — Telehealth: Payer: Self-pay | Admitting: Internal Medicine

## 2021-02-15 DIAGNOSIS — I25118 Atherosclerotic heart disease of native coronary artery with other forms of angina pectoris: Secondary | ICD-10-CM

## 2021-02-15 MED ORDER — AMIODARONE HCL 200 MG PO TABS: 300.0000 mg | ORAL_TABLET | Freq: Every day | ORAL | 0 refills | Status: DC

## 2021-02-15 NOTE — Telephone Encounter (Signed)
PT called in regards to their amiodarone (PACERONE) 200 MG tablet. They have a question for Dr.Tracy or Arianna in regards to it as they stated their being told they need to see Dr.Gollan to refill and they want to ask yall about that.

## 2021-02-15 NOTE — Telephone Encounter (Signed)
Left message to return call.  Patient's Cardiologist prescribes this medication for him. He will need to contact their office.

## 2021-02-15 NOTE — Telephone Encounter (Signed)
Patient calling to check on status  Patient has appointment scheduled for 8/18 Would like to know if we can send in a small amount before his appointment Please inform patient if unable to send in

## 2021-02-22 NOTE — Telephone Encounter (Signed)
Medication was called in by Dr Rockey Situ 02/15/21

## 2021-03-07 ENCOUNTER — Telehealth: Payer: Self-pay | Admitting: Podiatry

## 2021-03-07 ENCOUNTER — Ambulatory Visit (INDEPENDENT_AMBULATORY_CARE_PROVIDER_SITE_OTHER): Payer: Medicare Other | Admitting: Pharmacist

## 2021-03-07 DIAGNOSIS — I25118 Atherosclerotic heart disease of native coronary artery with other forms of angina pectoris: Secondary | ICD-10-CM | POA: Diagnosis not present

## 2021-03-07 DIAGNOSIS — J449 Chronic obstructive pulmonary disease, unspecified: Secondary | ICD-10-CM | POA: Diagnosis not present

## 2021-03-07 DIAGNOSIS — I5022 Chronic systolic (congestive) heart failure: Secondary | ICD-10-CM | POA: Diagnosis not present

## 2021-03-07 DIAGNOSIS — N401 Enlarged prostate with lower urinary tract symptoms: Secondary | ICD-10-CM | POA: Diagnosis not present

## 2021-03-07 DIAGNOSIS — R7303 Prediabetes: Secondary | ICD-10-CM

## 2021-03-07 NOTE — Patient Instructions (Signed)
Ian Mills,   It was great talking to you today!  Follow up with podiatry about the imaging of your foot. Their number is [phone].   You are due for follow up with Dr. Rockey Situ. Call his office to schedule at [phone].  Call me with any questions or concerns!  Catie Darnelle Maffucci, PharmD [phone]   Visit Information  PATIENT GOALS:  Goals Addressed               This Visit's Progress     Patient Stated     Medication Monitoring (pt-stated)        Patient Goals/Self-Care Activities Over the next 90 days, patient will:  - take medications as prescribed focus on medication adherence by collaboration to investigate insurance  weigh daily, and contact provider if weight gain of >3 lbs/day or >5 lbs/week collaborate with provider on medication access solutions         The patient verbalized understanding of instructions, educational materials, and care plan provided today and agreed to receive a mailed copy of patient instructions, educational materials, and care plan.   Plan: Telephone follow up appointment with care management team member scheduled for:  ~ 12 weeks  Catie Darnelle Maffucci, PharmD, [place], [place] Clinical Pharmacist [employer] [facility] [employer] [phone]

## 2021-03-07 NOTE — Chronic Care Management (AMB) (Signed)
Chronic Care Management Pharmacy Note  03/07/2021 Name:  Ian Mills MRN:  [id number] DOB:  [birth year]   Subjective: Ian Mills is an 65 y.o. year old male who is a primary patient of McLean-Scocuzza, Nino Glow, MD.  The CCM team was consulted for assistance with disease management and care coordination needs.    Engaged with patient by telephone for follow up visit in response to provider referral for pharmacy case management and/or care coordination services.   Consent to Services:  The patient was given information about Chronic Care Management services, agreed to services, and gave verbal consent prior to initiation of services.  Please see initial visit note for detailed documentation.   Patient Care Team: McLean-Scocuzza, Nino Glow, MD as PCP - General (Internal Medicine) Minna Merritts, MD as PCP - Cardiology (Cardiology) Deboraha Sprang, MD as PCP - Electrophysiology (Cardiology) Minna Merritts, MD as Consulting Physician (Cardiology) De Hollingshead, RPH-CPP as Pharmacist (Pharmacist)  Recent office visits: 7/20 - PCP kabob skewer in foot - referral to podiatry  Recent consult visits: 5/27 colonoscopy - 16 polyps that were precancerous. Recommendation for genetic counseling 6/28 - genetic couseling, nothing idenitified  Objective:  Lab Results  Component Value Date   CREATININE 1.33 (H) 06/09/2020   CREATININE 1.33 (H) 06/09/2020   CREATININE 1.46 (H) 03/03/2020    Lab Results  Component Value Date   HGBA1C 5.4 03/03/2020   Last diabetic Eye exam: No results found for: HMDIABEYEEXA  Last diabetic Foot exam: No results found for: HMDIABFOOTEX      Component Value Date/Time   CHOL 146 03/03/2020 1417   CHOL 170 12/09/2016 1103   CHOL 111 01/20/2014 0929   TRIG 92 03/03/2020 1417   TRIG 88 01/20/2014 0929   HDL 56 03/03/2020 1417   HDL 53 12/09/2016 1103   HDL 39 (L) 01/20/2014 0929   CHOLHDL 2.6 03/03/2020 1417   VLDL 26.6 06/10/2019 0955    VLDL 18 01/20/2014 0929   LDLCALC 72 03/03/2020 1417   LDLCALC 54 01/20/2014 0929    Hepatic Function Latest Ref Rng & Units 03/03/2020 08/23/2019 06/10/2019  Total Protein 6.1 - 8.1 g/dL 7.3 7.1 6.8  Albumin 3.5 - 5.0 g/dL - 3.8 4.1  AST 10 - 35 U/L 22 26 22   ALT 9 - 46 U/L 34 41 29  Alk Phosphatase 38 - 126 U/L - 107 117  Total Bilirubin 0.2 - 1.2 mg/dL 0.6 0.8 0.4  Bilirubin, Direct 0.0 - 0.2 mg/dL - 0.1 -    Lab Results  Component Value Date/Time   TSH 1.94 06/10/2019 09:55 AM   TSH 2.080 04/22/2017 10:07 AM    CBC Latest Ref Rng & Units 03/03/2020 08/23/2019 08/20/2019  WBC 3.8 - 10.8 Thousand/uL 6.2 10.4 7.0  Hemoglobin 13.2 - 17.1 g/dL 15.3 14.2 15.1  Hematocrit 38.5 - 50.0 % 45.8 43.9 47.0  Platelets 140 - 400 Thousand/uL 172 204 193    Lab Results  Component Value Date/Time   VD25OH 31.05 06/10/2019 09:55 AM    Clinical ASCVD: Yes  The ASCVD Risk score (Goff DC Jr., et al., 2013) failed to calculate for the following reasons:   The patient has a prior MI or stroke diagnosis    Social History   Tobacco Use  Smoking Status Former   Packs/day: 0.50   Years: 42.00   Pack years: 21.00   Types: Cigarettes   Quit date: 11/10/2019   Years since quitting: 1.3  Smokeless  Tobacco Never   BP Readings from Last 3 Encounters:  02/14/21 134/82  12/22/20 116/66  11/14/20 (!) 118/91   Pulse Readings from Last 3 Encounters:  02/14/21 61  12/22/20 (!) 58  08/23/20 62   Wt Readings from Last 3 Encounters:  02/14/21 205 lb 3.2 oz (93.1 kg)  12/22/20 195 lb (88.5 kg)  11/14/20 200 lb (90.7 kg)    Assessment: Review of patient past medical history, allergies, medications, health status, including review of consultants reports, laboratory and other test data, was performed as part of comprehensive evaluation and provision of chronic care management services.   SDOH:  (Social Determinants of Health) assessments and interventions performed:  SDOH Interventions     Flowsheet Row Most Recent Value  SDOH Interventions   Financial Strain Interventions Other (Comment)  [manufacturer assistance]       CCM Care Plan  Allergies  Allergen Reactions   Nitroglycerin Nausea Only and Other (See Comments)    Per patient "causes severe  headache"    Medications Reviewed Today     Reviewed by De Hollingshead, RPH-CPP (Pharmacist) on 03/07/21 at 1124  Med List Status: <None>   Medication Order Taking? Sig Documenting Provider Last Dose Status Informant  albuterol (VENTOLIN HFA) 108 (90 Base) MCG/ACT inhaler [id number] No Inhale 1 puff into the lungs every 6 (six) hours as needed for wheezing or shortness of breath.  Patient not taking: Reported on 03/07/2021   [provider] Not Taking Active            Med Note Mayo Ao Aug 16, 2019  9:11 AM)    ALPRAZolam Duanne Moron) 0.5 MG tablet [id number] Yes Take 1 tablet (0.5 mg total) by mouth 3 (three) times daily as needed. for anxiety McLean-Scocuzza, Nino Glow, MD Taking Active            Med Note ([place]   Wed Mar 07, 2021 11:21 AM) 2-3 times daily  amiodarone (PACERONE) 200 MG tablet [id number] Yes Take 1.5 tablets (300 mg total) by mouth daily. Minna Merritts, MD Taking Active   aspirin 81 MG EC tablet [id number] Yes Take 1 tablet (81 mg total) by mouth daily. Minna Merritts, MD Taking Active Self  atorvastatin (LIPITOR) 80 MG tablet [id number] Yes Take 1 tablet (80 mg total) by mouth daily. McLean-Scocuzza, Nino Glow, MD Taking Active   carvedilol (COREG) 3.125 MG tablet [id number] Yes Take 1 tablet (3.125 mg total) by mouth 2 (two) times daily. McLean-Scocuzza, Nino Glow, MD Taking Active   clopidogrel (PLAVIX) 75 MG tablet [id number] Yes Take 1 tablet by mouth once daily McLean-Scocuzza, Nino Glow, MD Taking Active   ezetimibe (ZETIA) 10 MG tablet [id number] Yes Take 1 tablet (10 mg total) by mouth daily. McLean-Scocuzza, Nino Glow, MD Taking Active   furosemide (LASIX) 40 MG  tablet [id number] Yes Take 1 tablet by mouth twice daily Gollan, Kathlene November, MD Taking Active            Med Note Darnelle Maffucci, Arville Lime   Thu Sep 21, 2020 11:59 AM) 80 mg QAM  ipratropium-albuterol (DUONEB) 0.5-2.5 (3) MG/3ML SOLN [id number] No Take 3 mLs by nebulization every 6 (six) hours as needed.  Patient not taking: No sig reported   McLean-Scocuzza, Nino Glow, MD Not Taking Active   potassium chloride (KLOR-CON 10) 10 MEQ tablet [id number] Yes Take 1 tablet (10 mEq total) by mouth daily. McLean-Scocuzza, Nino Glow, MD Taking Active   sacubitril-valsartan [facility]) [phone]  MG [id number] Yes Take 1 tablet by mouth 2 (two) times daily. Theora Gianotti, NP Taking Active   tadalafil (CIALIS) 10 MG tablet [id number] Yes Take 1-2 tablets (10-20 mg total) by mouth daily as needed for erectile dysfunction. Billey Co, MD Taking Active   Tiotropium Bromide-Olodaterol (STIOLTO RESPIMAT) 2.5-2.5 MCG/ACT AERS [id number] Yes Inhale 2 puffs into the lungs daily. 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   Essential hypertension 10/13/2019   Unstable angina (HCC)    Carotid artery stenosis 06/10/2019   Baker's cyst 06/10/2019   Bilateral impacted cerumen 63/07/6008   Chronic systolic congestive heart failure ([place]) 06/10/2019   Scoliosis 06/10/2019   Anxiety 04/15/2019   Chronic obstructive pulmonary disease ([place]) 04/15/2019   COPD exacerbation ([place]) 09/08/2018   Chest tightness 02/12/2018   Pain and swelling of left lower  extremity 02/12/2018   Sacroiliac pain 07/31/2016   Acute on chronic systolic CHF (congestive heart failure) ([place]) 06/27/2016   Hypokalemia 06/25/2016   NSTEMI (non-ST elevated myocardial infarction) ([place]) 06/23/2016   Acute exacerbation of CHF (congestive heart failure) ([place]) 06/22/2016   DDD (degenerative disc disease), lumbar 06/13/2016   Tobacco abuse 06/13/2016   Dizziness 03/30/2015   Former smoker 09/29/2014   Syncope 06/01/2014   Insomnia 06/01/2014   Ischemic cardiomyopathy    Ventricular tachycardia ([place]) 01/23/2012   Single implantable cardioverter-defibrillator-BSx 12/11/2011   SYNCOPE AND COLLAPSE 09/26/2010   Hyperlipidemia 02/23/2010   Hypotension 02/23/2010   CAD (coronary artery disease) 02/23/2010    Immunization History  Administered Date(s) Administered   Fluad Quad(high Dose 65+) 06/10/2019, 06/09/2020   Influenza,inj,Quad PF,6+ Mos 06/26/2016   Influenza-Unspecified 06/29/2015   PFIZER(Purple Top)SARS-COV-2 Vaccination 12/22/2019, 01/12/2020, 06/02/2020   Pneumococcal Polysaccharide-23 06/29/2015, 09/09/2018   Tdap 04/22/2019, 03/03/2020    Conditions to be addressed/monitored: CHF, CAD, HTN, HLD, DMII, and Pulmonary Disease  Care Plan : Medication Management  Updates made by De Hollingshead, RPH-CPP since 03/07/2021 12:00 AM     Problem: CHF, COPD      Long-Range Goal: Disease Progression Prevention   This Visit's Progress: On track  Recent Progress: On track  Priority: High  Note:   Current Barriers:  Unable to independently afford treatment regimen Complex patient with multiple comorbidities and low health literacy  Pharmacist Clinical Goal(s):  Over the next 90 days, patient will verbalize ability to afford treatment regimen. Over the next 90 days, patient will adhere to prescribed medication regimen.  Interventions: 1:1 collaboration with McLean-Scocuzza, Nino Glow, MD regarding development and update of comprehensive plan of care as  evidenced by provider attestation and co-signature Inter-disciplinary care team collaboration (see longitudinal plan of care) Comprehensive medication review performed; medication list updated in electronic medical record  Health Maintenance: Completed colonoscopy. Underwent genetic testing. No abnormalities identified.   Acute Needs: Still has not heard back on scheduling foot MRI in [facility] foot. Encouraged to call podiatry today to follow up. He will do so.  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 Rockey Situ Ventricular tachycardia: amiodarone 300 mg daily - Dr. Caryl Comes; scheduled for f/u next month Current BP readings: has not checked in a while. Reports generally ~120-130s/70-80s;  Denies any lightheadedness, dizziness.  Weights: has not been weighing since having the kabob in the foot Current physical activity: limited by foot pain Consider SGLT2 moving forward. Patient would qualify for patient assistance. Will encourage to f/u with Dr. Rockey Situ.  Due for lab work. Patient will call [facility] in [place] to schedule at their [place].  CAD (s/p prior stenting), Hyperlipidemia: Uncontrolled, consider more aggressive goal of at least <70, if not <55, given hx ASCVD and CKD, DM;  current treatment: atorvastatin 80 mg daily, ezetimibe 10 mg daily Antiplatelet regimen: aspirin 81 mg daily, clopidogrel 75 mg daily Recommended to continue current regimen at this time.   Could consider addition of PCSK9i in the future. Could pursue [employer] hyperlipidemia grant funding when it is open again (likely in 2023) Due for lab work. Patient will call [facility] in [place] to schedule at their [facility].  Chronic Obstructive Pulmonary Disease: Controlled; current treatment: Stiolto 2.5/2.5 mcg, 2 puffs  daily; no use of albuterol or nebulizer recently.  Tobacco free since 10/2019. Praised for continued abstinence.  Continue current regimen at this time.   Anxiety: Inappropriately controlled; current treatment: alprazolam 0.125 mg up to TID PRN, patient taking BID-TID for anxiety Continue to reiterate risks of regular benzodiazepine use. Patient declined addition of SSRI/SNRI at last PCP visit. Today, he notes that he is satisfied with his current regimen and does not want to change anything. Provided counseling on long term risks of chronic benzodiazepine use.   Patient Goals/Self-Care Activities Over the next 90 days, patient will:  - take medications as prescribed focus on medication adherence by collaboration to investigate insurance  weigh daily, and contact provider if weight gain of >3 lbs/day or >5 lbs/week collaborate with provider on medication access solutions  Follow Up Plan: Telephone follow up appointment with care management team member scheduled for:~ 12 weeks      Medication Assistance: Delene Loll obtained through [employer] medication assistance program.  Enrollment ends 07/28/21. Stiolto obtained through [employer] medication assistance program.  Enrollment ends 07/28/21  Patient's preferred pharmacy is:  [facility] [address] [place] - [place] [address] [place] [address] Phone: [phone] Fax: [phone]  [facility] [place], [place] [address] [place] [address] Phone: [phone] Fax: [phone]    Follow Up:  Patient agrees to Care Plan and Follow-up.  Plan: Telephone follow up appointment with care management team member scheduled for:  ~ 12 weeks  Catie Darnelle Maffucci, PharmD, [place], [place] Clinical Pharmacist [employer] at [employer] [phone]

## 2021-03-07 NOTE — Telephone Encounter (Signed)
Patient called stating he is suppose to get an MRI. Patient states he has not heard from them yet. Patient wanted to know when he will hear from them.

## 2021-03-13 ENCOUNTER — Other Ambulatory Visit: Payer: Self-pay

## 2021-03-13 ENCOUNTER — Ambulatory Visit (INDEPENDENT_AMBULATORY_CARE_PROVIDER_SITE_OTHER): Payer: Medicare Other | Admitting: Internal Medicine

## 2021-03-13 ENCOUNTER — Other Ambulatory Visit
Admission: RE | Admit: 2021-03-13 | Discharge: 2021-03-13 | Disposition: A | Discharge status: Home / Self Care | Payer: Medicare Other | Source: Ambulatory Visit | Attending: Internal Medicine | Admitting: Internal Medicine

## 2021-03-13 ENCOUNTER — Encounter: Payer: Self-pay | Admitting: Internal Medicine

## 2021-03-13 ENCOUNTER — Ambulatory Visit: Payer: Medicare Other

## 2021-03-13 VITALS — BP 140/80 | HR 55 | Ht 67.0 in | Wt 203.0 lb

## 2021-03-13 DIAGNOSIS — E785 Hyperlipidemia, unspecified: Secondary | ICD-10-CM | POA: Diagnosis not present

## 2021-03-13 DIAGNOSIS — I10 Essential (primary) hypertension: Secondary | ICD-10-CM | POA: Diagnosis not present

## 2021-03-13 DIAGNOSIS — I255 Ischemic cardiomyopathy: Secondary | ICD-10-CM | POA: Insufficient documentation

## 2021-03-13 DIAGNOSIS — I472 Ventricular tachycardia, unspecified: Secondary | ICD-10-CM

## 2021-03-13 DIAGNOSIS — I5022 Chronic systolic (congestive) heart failure: Secondary | ICD-10-CM

## 2021-03-13 DIAGNOSIS — Z79899 Other long term (current) drug therapy: Secondary | ICD-10-CM

## 2021-03-13 DIAGNOSIS — Z95810 Presence of automatic (implantable) cardiac defibrillator: Secondary | ICD-10-CM

## 2021-03-13 LAB — COMPREHENSIVE METABOLIC PANEL
ALT: 48 U/L — ABNORMAL HIGH (ref 0–44)
AST: 42 U/L — ABNORMAL HIGH (ref 15–41)
Albumin: 4 g/dL (ref 3.5–5.0)
Alkaline Phosphatase: 95 U/L (ref 38–126)
Anion gap: 11 (ref 5–15)
BUN: 28 mg/dL — ABNORMAL HIGH (ref 8–23)
CO2: 23 mmol/L (ref 22–32)
Calcium: 9 mg/dL (ref 8.9–10.3)
Chloride: 103 mmol/L (ref 98–111)
Creatinine, Ser: 1.55 mg/dL — ABNORMAL HIGH (ref 0.61–1.24)
GFR, Estimated: 49 mL/min — ABNORMAL LOW (ref >60)
Glucose, Bld: 97 mg/dL (ref 70–99)
Potassium: 4.2 mmol/L (ref 3.5–5.1)
Sodium: 137 mmol/L (ref 135–145)
Total Bilirubin: 1 mg/dL (ref 0.3–1.2)
Total Protein: 7.5 g/dL (ref 6.5–8.1)

## 2021-03-13 LAB — TSH: TSH: 1.933 u[IU]/mL (ref 0.350–4.500)

## 2021-03-13 NOTE — Addendum Note (Signed)
Addended by: Alvis Lemmings C on: 03/13/2021 12:57 PM   Modules accepted: Orders

## 2021-03-13 NOTE — Patient Instructions (Signed)
Medication Instructions:  - Your physician recommends that you continue on your current medications as directed. Please refer to the Current Medication list given to you today.  *If you need a refill on your cardiac medications before your next appointment, please call your pharmacy*   Lab Work: - Your physician recommends that you have lab work today: CMET/ TSH  If you have labs (blood work) drawn today and your tests are completely normal, you will receive your results only by: [place] (if you have MyChart) OR A paper copy in the mail If you have any lab test that is abnormal or we need to change your treatment, we will call you to review the results.   Testing/Procedures: - [facility] should be calling you to schedule your Foot MRI. - please let us know if you do you do not hear from them in the next 48 hours   Follow-Up: At [facility], you and your health needs are our priority.  As part of our continuing mission to provide you with exceptional heart care, we have created designated Provider Care Teams.  These Care Teams include your primary Cardiologist (physician) and Advanced Practice Providers (APPs -  Physician Assistants and Nurse Practitioners) who all work together to provide you with the care you need, when you need it.  We recommend signing up for the patient portal called "MyChart".  Sign up information is provided on this After Visit Summary.  MyChart is used to connect with patients for Virtual Visits (Telemedicine).  Patients are able to view lab/test results, encounter notes, upcoming appointments, etc.  Non-urgent messages can be sent to your provider as well.   To learn more about what you can do with MyChart, go to [email or web address].    Your next appointment:   6 month(s)  The format for your next appointment:   In Person  Provider:   Virl Axe, MD   Other Instructions N/a

## 2021-03-13 NOTE — Progress Notes (Signed)
Patient Care Team: McLean-Scocuzza, Nino Glow, MD as PCP - General (Internal Medicine) Minna Merritts, MD as PCP - Cardiology (Cardiology) Deboraha Sprang, MD as PCP - Electrophysiology (Cardiology) Minna Merritts, MD as Consulting Physician (Cardiology) De Hollingshead, RPH-CPP as Pharmacist (Pharmacist)   HPI  Ian Mills is a 65 y.o. male Seen in followup for ICD implanted May 2013 following presentation with ventricular tachycardia for which he takes amiodarone .    Ischemic heart disease with prior stenting of his LAD PDA and OM last in 2008. Catheterization in May 2013 without further PCI. The details of that evaluation are not immediately available. MRI scanning 2008 demonstrated a large anterolateral region of non-viability ejection fraction was 35%.  Echocardiogram 11/15 demonstrated EF of 20-25% with LAD   The patient denies chest pain, shortness of breath, nocturnal dyspnea, orthopnea.  There have been no palpitations, lightheadedness or syncope.  Complains of pain and swelling of his left foot.  About a month or 2 ago he stepped on a skewer that had wedged itself in the flooring.  He went to [facility] imaging was anticipated but not able to be accomplished because he cannot get an IV.  He saw [place] [facility].  They were going to order an MRI.  Never ordered.  Now with swelling and worsening pain of his left foot.  No fevers.   Patient denies symptoms of GI intolerance, sun sensitivity, neurological symptoms attributable to amiodarone.    Date Cr K TSH LFTs  8/17   1.4 16  5/18  1.05 3.9 2.08 (9/18)    7/19  1.4 3.9    6/22       DATE TEST EF   11/15 Echo   25-30 %   1/21 Myoview  <30% Prior Scar-anterior Inferolateral       Past Medical History:  Diagnosis Date   Coronary artery disease    a. 3 stents to the LAD, 1 to the PDA, and 1 to the OM3; b. cath 06/2013: chronically occluded LAD, patent stent LAD, D1 20%, pLCx 30%, mLCx 20%, pRCA 70% (FFR  0.74) s/p PCI/DES, mRCA 70% s/p PCI/DES, RPDA 50%, EF 20% w/ AK of anterolateral & apical walls, moderately elevated LVEDP   Family history of lung cancer    HFrEF (heart failure with reduced ejection fraction) ([place])    a. 05/2014 Echo: EF 25-30%; b. 01/2018 Echo ([facility]): EF 30%.   HLD (hyperlipidemia)    HTN (hypertension)    ICD  single,BSX    a. DOI 11/2011; b. S/N# [id number]   Ischemic cardiomyopathy    a. 05/2014 Echo: EF 25-30%; b. 01/2018 Echo ([facility]): EF 30%, sev glob HK w/ apical/ant AK. Mild LVH. Mod LAE, mild RAE. Triv MR/TR.   Personal history of colonic polyps    Pneumonia    08/2018   Syncope and collapse    Ventricular tachycardia ([place]) 11/2011   a. s/p AICD implant     Past Surgical History:  Procedure Laterality Date   appendectomy     CARDIAC CATHETERIZATION  Nov 30 2011   [facility]   CARDIAC DEFIBRILLATOR PLACEMENT  May 2013   COLONOSCOPY WITH PROPOFOL N/A 12/22/2020   Procedure: COLONOSCOPY WITH PROPOFOL;  Surgeon: Jonathon Bellows, MD;  Location: [facility] ENDOSCOPY;  Service: Gastroenterology;  Laterality: N/A;   CORONARY ANGIOPLASTY  2014   x 2 stents   CORONARY STENT PLACEMENT     x7   LEFT HEART CATH AND CORONARY ANGIOGRAPHY Left 08/30/2019  Procedure: LEFT HEART CATH AND CORONARY ANGIOGRAPHY;  Surgeon: Wellington Hampshire, MD;  Location: [place] CV LAB;  Service: Cardiovascular;  Laterality: Left;   PROSTATE SURGERY     urethra grew around prostate    TUMOR REMOVAL  2001   chest thymic cyst; behind lungs [facility] benign     Current Outpatient Medications  Medication Sig Dispense Refill   ALPRAZolam (XANAX) 0.5 MG tablet Take 1 tablet (0.5 mg total) by mouth 3 (three) times daily as needed. for anxiety 90 tablet 5   amiodarone (PACERONE) 200 MG tablet Take 1.5 tablets (300 mg total) by mouth daily. 135 tablet 0   aspirin 81 MG EC tablet Take 1 tablet (81 mg total) by mouth daily. 30 tablet 6   atorvastatin (LIPITOR) 80 MG tablet Take 1 tablet (80 mg total) by mouth daily. 90  tablet 3   carvedilol (COREG) 3.125 MG tablet Take 1 tablet (3.125 mg total) by mouth 2 (two) times daily. 180 tablet 3   clopidogrel (PLAVIX) 75 MG tablet Take 1 tablet by mouth once daily 90 tablet 0   ezetimibe (ZETIA) 10 MG tablet Take 1 tablet (10 mg total) by mouth daily. 90 tablet 3   furosemide (LASIX) 40 MG tablet Take 1 tablet by mouth twice daily (Patient taking differently: Take 80 mg by mouth daily.) 180 tablet 3   potassium chloride (KLOR-CON 10) 10 MEQ tablet Take 1 tablet (10 mEq total) by mouth daily. 90 tablet 3   sacubitril-valsartan (ENTRESTO) 97-103 MG Take 1 tablet by mouth 2 (two) times daily. 180 tablet 0   tadalafil (CIALIS) 10 MG tablet Take 1-2 tablets (10-20 mg total) by mouth daily as needed for erectile dysfunction. 30 tablet 11   Tiotropium Bromide-Olodaterol (STIOLTO RESPIMAT) 2.5-2.5 MCG/ACT AERS Inhale 2 puffs into the lungs daily. 4 g 0   albuterol (VENTOLIN HFA) 108 (90 Base) MCG/ACT inhaler Inhale 1 puff into the lungs every 6 (six) hours as needed for wheezing or shortness of breath. (Patient not taking: No sig reported)     ipratropium-albuterol (DUONEB) 0.5-2.5 (3) MG/3ML SOLN Take 3 mLs by nebulization every 6 (six) hours as needed. (Patient not taking: No sig reported) 360 mL 11   No current facility-administered medications for this visit.    Allergies  Allergen Reactions   Nitroglycerin Nausea Only and Other (See Comments)    Per patient "causes severe  headache"    Review of Systems negative except from HPI and PMH  Physical Exam BP 140/80 (BP Location: Left Arm, Patient Position: Sitting, Cuff Size: Normal)   Pulse (!) 55   Ht '5\' 7"'$  (1.702 m)   Wt 203 lb (92.1 kg)   SpO2 98%   BMI 31.79 kg/m  Well developed and well nourished in no acute distress HENT normal Neck supple with JVP-6-8 Clear Device pocket well healed; without hematoa or erythema.  There is no tethering  Regular rate and rhythm, no  gallop No  murmur Abd-soft with active  BS No Clubbing cyanosis asymmetric L 1+ edema including foot Skin-warm and dry A & Oriented  Grossly normal sensory and motor function  ECG sinus at 55 Intervals 20/10/44 Left axis deviation-poor R wave progression with Q waves V1-V6  Assessment and  Plan  Ventricular tachycardia  Ischemic cardiomyopathy prior inferior wall and anterior wall MI  Congestive heart failure-chronic-systolic  Hypertension  Hyperlipidemia  Implantable defibrillator-. [place]  The patient's device was interrogated.  The information was reviewed. No changes were made in  the programming.     Penetrating wound of his left foot with wound left behind  No intercurrent Ventricular tachycardia.  Continue carvedilol 3.125 and amiodarone.  We will decrease the dose from 300-200 mg a day.  Need to check amiodarone surveillance laboratories  Without symptoms of ischemia.  Continue 80 mg atorvastatin statin, ASA 81  Plavix 75,     Is cardiomyopathy continue carvedilol, Entresto 97/103.blood pressure may allow for introduction of spironolactone.  More urgent issue today is the residual injury to his foot related to the broken with the skewer and the potential for infection with his implanted device.  Agree with the need for MRI. Suspect will need surgical exploration.  I will try to reach out [facility] but currently their office is closed for lunch  Pt heart failure status is stable. Continue furosemide 80 mg.  Electrolytes need to be assessed

## 2021-03-13 NOTE — Addendum Note (Signed)
Addended by: Janan Ridge on: 03/13/2021 01:25 PM   Modules accepted: Orders

## 2021-03-15 ENCOUNTER — Ambulatory Visit: Payer: Medicare Other | Admitting: Nurse Practitioner

## 2021-03-22 ENCOUNTER — Telehealth: Payer: Self-pay | Admitting: Internal Medicine

## 2021-03-22 NOTE — Telephone Encounter (Signed)
Please call to discuss MRI scheduling.

## 2021-03-22 NOTE — Telephone Encounter (Signed)
Reviewed device information and spoke with [employer].  Patient device is NOT MRI compatible.  His lead is, but the Incepta ICD is not.    Notified patient and informed him I will foraward this info to Dr. Caryl Comes.

## 2021-03-22 NOTE — Telephone Encounter (Signed)
I called and spoke with the patient. He states he still has not heard from radiology scheduling about his MRI on his foot that was ordered by Dr. Sherryle Lis (podiatry) 02/14/21. At his last appointment with Dr. Caryl Comes on 03/13/21, Dr. Caryl Comes called radiology to try to find out why this had not been scheduled. He advised radiology at that time that the patient's device was MRI compatible.  I called radiology scheduling at [phone] and was told they had left a message for the patient on 03/13/21 to call with his Device info. They need the DOI/ serial #/ etc to run by their MRI team for clearance to perform the foot MRI. They advised that a verbal ok from Dr. Caryl Comes was not sufficient.   I inquired if our Device Team could just call with this information to help facilitate this study for the patient. He currently has a piece of a wooden skewer in his foot and it was red and swollen at his visit with Dr. Caryl Comes on 03/13/21.  Per radiology scheduling, Device may secure chat, staff message or teams message, Terri Skains in radiology to review his device info.  Will forward to [facility] team to provide Device info/ lead information to [facility] in Radiology.  I have called and notified the patient of the above. He states he does not have his device card and most likely will need another one. He voices understanding of the above and is agreeable.

## 2021-03-23 ENCOUNTER — Telehealth: Payer: Self-pay

## 2021-03-23 NOTE — Telephone Encounter (Signed)
I spoke with the patient and advised him of the information below and that he will need to go to [facility] to have his MRI done. He voices understanding and is agreeable.  I have advised him I will reach out to [facility] and they will contact him directly with an appointment.  I have messaged Dr. Willis Modena to find out a good contact # / fax # to send the patient's demographics/ copy of the MRI order to.   Waiting on a response.

## 2021-03-23 NOTE — Telephone Encounter (Signed)
I received a message from Dr. Caryl Comes last night stating that he has reached out to Dr. Willis Modena at [facility] to see if they can help facilitate the patient's MRI being done there due to his device.   Per Dr. Willis Modena, they will need to know the type of MRI and will try to get him in next week to their NP and they will place the order.   I have attempted to call the patient to discuss the above information. No answer- I left a message to please call back.

## 2021-03-23 NOTE — Telephone Encounter (Signed)
Confirmed fax for Ian Mills records request form to [facility] ED. Sent to scan

## 2021-03-27 ENCOUNTER — Telehealth: Payer: Self-pay | Admitting: Internal Medicine

## 2021-03-27 MED ORDER — AMIODARONE HCL 200 MG PO TABS
200.0000 mg | ORAL_TABLET | Freq: Every day | ORAL | 0 refills | Status: DC
Start: 2021-03-27 — End: 2021-06-11

## 2021-03-27 NOTE — Telephone Encounter (Signed)
Ian Sprang, MD  03/27/2021  1:33 PM EDT      Please inform patient that drug surveillance labs are abnormal  With elevated transaminases, will decrease amio from 300>>200 mg daily and recheck in 3 months

## 2021-03-27 NOTE — Telephone Encounter (Signed)
Please call with lab results 

## 2021-03-27 NOTE — Telephone Encounter (Signed)
I spoke with the patient regarding his lab results. I have advised him of Dr. Olin Pia recommendations to: 1) Decrease amiodarone to 200 mg once daily 2) Repeat his LFT's in 3 months- I advised I will reach out to him closer to that time to arrange for this to be done.  The patient voices understanding of the above recommendations and is agreeable.

## 2021-03-27 NOTE — Telephone Encounter (Signed)
To Dr. Caryl Comes to please review most recent labs from his office visit on 03/13/21.

## 2021-03-27 NOTE — Telephone Encounter (Signed)
Attempted to call the patient.  No answer- I left a message to please call back to discuss. 

## 2021-03-27 NOTE — Telephone Encounter (Signed)
I spoke with the patient today regarding his recent lab results.  He states he has not heard from Dr. Candiss Norse office about scheduling his MRI.  He advised someone from [place] called his wife's phone #. I advised him I have looked online and Dr. Willis Modena is listed as practicing at [facility]. I have advised him to obtain the call back # from his wife's phone and call them back as they were most likely trying to reach him to schedule his appointment.  The patient voices understanding.   Dr. Caryl Comes had also sent a Secure Chat message to Dr. Sherryle Lis in podiatry as well to update him of the patient's status:  Dr. Caryl Comes:  Quita Skye good day my name is Jolyn Nap and we share this patient who has residual wooden skewer in his foot and for whom you ordered an MRI that never got ordered. We have tried, pt and [place], multitple times to call your office, and as I am sure you are aware, getting an answer is a challenge ( no stones here, cardiololgy has been the same) but we have taken it upon ourselves to order the MRI which will be done at [facility] and we would like to have you in the loop because ultimately he may need you to fix it. :)) thx you can callme on my cell [phone] but txt me first so I recognize your number   Dr. Sherryle Lis: Hi Dr Caryl Comes, I did hear from him and we were waiting to get it scheduled and cleared by [facility] to be able to do it, [facility] outpatient wouldn't do it due to his ICD. I'll have my nurse check with them and see if they'd do it any sooner than [facility] (I have another patient with the same situation so if [facility] is more expedient I'll send her there as well) thanks for ordering it there, appreciate the help. If he needs surgery for it will let you know

## 2021-04-04 ENCOUNTER — Telehealth: Payer: Self-pay | Admitting: Podiatry

## 2021-04-04 ENCOUNTER — Telehealth: Payer: Self-pay | Admitting: *Deleted

## 2021-04-04 ENCOUNTER — Telehealth: Payer: Self-pay | Admitting: Internal Medicine

## 2021-04-04 DIAGNOSIS — Z95810 Presence of automatic (implantable) cardiac defibrillator: Secondary | ICD-10-CM | POA: Diagnosis not present

## 2021-04-04 DIAGNOSIS — I472 Ventricular tachycardia: Secondary | ICD-10-CM | POA: Diagnosis not present

## 2021-04-04 DIAGNOSIS — Z4502 Encounter for adjustment and management of automatic implantable cardiac defibrillator: Secondary | ICD-10-CM | POA: Diagnosis not present

## 2021-04-04 DIAGNOSIS — I255 Ischemic cardiomyopathy: Secondary | ICD-10-CM | POA: Diagnosis not present

## 2021-04-04 NOTE — Telephone Encounter (Signed)
"  I want to touch base with you about what kind of MRI he needs of his left foot so we can order that here at [facility].  Give me a call back."

## 2021-04-04 NOTE — Telephone Encounter (Signed)
I spoke with him and clarified he will have report sent to [place] when it is completed he will let the patient know to get the CD and report before his visit with [place] for follow-up

## 2021-04-04 NOTE — Telephone Encounter (Signed)
Patient would like to discuss what kind of device he has. He has to have an MRI performed at [facility] and needs to know this information.

## 2021-04-04 NOTE — Telephone Encounter (Signed)
His cardiologist told me he had a MRI scheduled at [facility]?

## 2021-04-04 NOTE — Telephone Encounter (Signed)
Patient states that he was to have an MRI for an object that he has in his foot. But unfortunately the patient has a defibrillator and is unable to have an MRI. He is wondering can have a CT be ordered or does he need to come in and be seen.

## 2021-04-04 NOTE — Telephone Encounter (Signed)
Spoke with patient advised that device is not MRI compatible.  Advised pt he will need to contact podiatrist to request a differest test for his foot.

## 2021-04-05 DIAGNOSIS — I10 Essential (primary) hypertension: Secondary | ICD-10-CM | POA: Diagnosis not present

## 2021-04-05 DIAGNOSIS — I214 Non-ST elevation (NSTEMI) myocardial infarction: Secondary | ICD-10-CM | POA: Diagnosis not present

## 2021-04-05 NOTE — Telephone Encounter (Signed)
Correct. He states when he got there they could not do the MRI because the type of defib that he has.

## 2021-04-05 NOTE — Telephone Encounter (Signed)
I spoke with his cardio NP yesterday and it's been handled. Thanks!

## 2021-04-06 DIAGNOSIS — I255 Ischemic cardiomyopathy: Secondary | ICD-10-CM | POA: Diagnosis not present

## 2021-04-06 DIAGNOSIS — I472 Ventricular tachycardia: Secondary | ICD-10-CM | POA: Diagnosis not present

## 2021-04-06 DIAGNOSIS — Z79899 Other long term (current) drug therapy: Secondary | ICD-10-CM | POA: Diagnosis not present

## 2021-04-07 LAB — COMPREHENSIVE METABOLIC PANEL
ALT: 33 IU/L (ref 0–44)
AST: 33 IU/L (ref 0–40)
Albumin/Globulin Ratio: 1.7 (ref 1.2–2.2)
Albumin: 3.8 g/dL (ref 3.8–4.8)
Alkaline Phosphatase: 100 IU/L (ref 44–121)
BUN/Creatinine Ratio: 14 (ref 10–24)
BUN: 20 mg/dL (ref 8–27)
Bilirubin Total: 0.4 mg/dL (ref 0.0–1.2)
CO2: 18 mmol/L — ABNORMAL LOW (ref 20–29)
Calcium: 8.9 mg/dL (ref 8.6–10.2)
Chloride: 104 mmol/L (ref 96–106)
Creatinine, Ser: 1.38 mg/dL — ABNORMAL HIGH (ref 0.76–1.27)
Globulin, Total: 2.3 g/dL (ref 1.5–4.5)
Glucose: 117 mg/dL — ABNORMAL HIGH (ref 65–99)
Potassium: 4.9 mmol/L (ref 3.5–5.2)
Sodium: 139 mmol/L (ref 134–144)
Total Protein: 6.1 g/dL (ref 6.0–8.5)
eGFR: 56 mL/min/{1.73_m2} — ABNORMAL LOW (ref >59)

## 2021-04-07 LAB — TSH: TSH: 2.66 u[IU]/mL (ref 0.450–4.500)

## 2021-04-09 NOTE — Telephone Encounter (Signed)
Attempted to call the patient to: 1) follow up and see if his foot MRI is scheduled - I see in Epic that he saw [facility] EP on 04/05/21.  2) notify him of his recent lab results:  Deboraha Sprang, MD  04/09/2021 12:23 PM EDT     Please Inform Patient   Labs are normal x renal function not normal but improved   Thanks     No answer- I left a message to please call back.

## 2021-04-10 NOTE — Telephone Encounter (Signed)
I called the patient and advised him of his most recent lab results dated 04/06/21- CMET/ TSH, but I questioned the patient where he had these labs done as we just drew this same panel on him in August and we didn't need to repeat his LFT's for 3 months from that time.  The patient advised he went to [facility] in [place] and thought they were drawing labs for his PCP including his cholesterol.  I have advised him his LFT's did look better on the lower dose amiodarone already, but have asked him to follow up with his PCP regarding additional lab studies.   I also advised the patient that I could see that [facility] EP saw him last week, but he advised, " I'm just not going through with that (MRI of the foot). I don't like what they would have to do with my device and draining the battery."  I advised the patient that if he, in fact, has wood in his foot and his foot gets infected, this infection could get in his blood stream and infect his device, then we would be looking at taking the whole device out.   I have asked him to PLEASE follow up with Ian Mills (podiatry- who originally saw him for the wound to his foot and ordered the MRI) or his PCP for further recommendations.  The patient states he would like to discuss this further with Ian Mills when he sees him 04/27/21. I have advised him that cardiology would not be making decisions about the treatment of his foot wound and that he needs to touch base with Ian Mills or Ian Mills ASAP.  The patient advised he will follow up as advised for the above issues.

## 2021-04-11 ENCOUNTER — Telehealth: Payer: Self-pay | Admitting: Podiatry

## 2021-04-11 NOTE — Telephone Encounter (Signed)
Patient called today stating he has an order from Dr [facility] for an MRI. Patient has a defibrillator and can't do the MRI.Patient  would like to know what can he do now without haviung the MRI done. Please call the patient at [phone].

## 2021-04-16 ENCOUNTER — Ambulatory Visit (INDEPENDENT_AMBULATORY_CARE_PROVIDER_SITE_OTHER): Payer: Medicare Other

## 2021-04-16 VITALS — Ht 67.0 in | Wt 203.0 lb

## 2021-04-16 DIAGNOSIS — Z0000 Encounter for general adult medical examination without abnormal findings: Secondary | ICD-10-CM

## 2021-04-16 NOTE — Patient Instructions (Addendum)
  Mr. Brocks , Thank you for taking time to come for your Medicare Wellness Visit. I appreciate your ongoing commitment to your health goals. Please review the following plan we discussed and let me know if I can assist you in the future.   These are the goals we discussed:  Goals       Patient Stated     Maintain Healthy Lifestyle (pt-stated)      Stay active Healthy diet      Medication Monitoring (pt-stated)      Patient Goals/Self-Care Activities Over the next 90 days, patient will:  - take medications as prescribed focus on medication adherence by collaboration to investigate insurance  weigh daily, and contact provider if weight gain of >3 lbs/day or >5 lbs/week collaborate with provider on medication access solutions         This is a list of the screening recommended for you and due dates:  Health Maintenance  Topic Date Due   Hemoglobin A1C  09/03/2020   Eye exam for diabetics  05/16/2021*   Zoster (Shingles) Vaccine (1 of 2) 07/16/2021*   Flu Shot  10/26/2021*   COVID-19 Vaccine (4 - Booster for Pfizer series) 02/26/2022*   Tetanus Vaccine  03/03/2030   Colon Cancer Screening  12/23/2030   Hepatitis C Screening: USPSTF Recommendation to screen - Ages 18-79 yo.  Completed   HPV Vaccine  Aged Out   Complete foot exam   Discontinued  *Topic was postponed. The date shown is not the original due date.

## 2021-04-16 NOTE — Progress Notes (Addendum)
Subjective:   Ian Mills is a 66 y.o. male who presents for Medicare Annual/Subsequent preventive examination.  Review of Systems    No ROS.  Medicare Wellness Virtual Visit.  Visual/audio telehealth visit, UTA vital signs.   See social history for additional risk factors.   Cardiac Risk Factors include: advanced age (>1men, >67 women)     Objective:    Today's Vitals   04/16/21 1240  Weight: 203 lb (92.1 kg)  Height: 5\' 7"  (1.702 m)   Body mass index is 31.79 kg/m.  Advanced Directives 04/16/2021 12/22/2020 04/13/2020 08/30/2019 08/23/2019 04/13/2019 09/18/2018  Does Patient Have a Medical Advance Directive? Yes Yes Yes Yes Yes Yes Yes  Type of Paramedic of [place];Living will [place];Living will [place];Living will Living will;Healthcare Power of [place];Living will [place];Living will  Does patient want to make changes to medical advance directive? No - Patient declined - No - Patient declined - - - No - Patient declined  Copy of [place] in Chart? Yes - validated most recent copy scanned in chart (See row information) No - copy requested Yes - validated most recent copy scanned in chart (See row information) - - Yes - validated most recent copy scanned in chart (See row information) Yes - validated most recent copy scanned in chart (See row information)    Current Medications (verified) Outpatient Encounter Medications as of 04/16/2021  Medication Sig   albuterol (VENTOLIN HFA) 108 (90 Base) MCG/ACT inhaler Inhale 1 puff into the lungs every 6 (six) hours as needed for wheezing or shortness of breath. (Patient not taking: No sig reported)   ALPRAZolam (XANAX) 0.5 MG tablet Take 1 tablet (0.5 mg total) by mouth 3 (three) times daily as needed. for anxiety   amiodarone (PACERONE) 200 MG tablet Take 1 tablet (200 mg  total) by mouth daily.   aspirin 81 MG EC tablet Take 1 tablet (81 mg total) by mouth daily.   atorvastatin (LIPITOR) 80 MG tablet Take 1 tablet (80 mg total) by mouth daily.   carvedilol (COREG) 3.125 MG tablet Take 1 tablet (3.125 mg total) by mouth 2 (two) times daily.   clopidogrel (PLAVIX) 75 MG tablet Take 1 tablet by mouth once daily   ezetimibe (ZETIA) 10 MG tablet Take 1 tablet (10 mg total) by mouth daily.   furosemide (LASIX) 40 MG tablet Take 1 tablet by mouth twice daily (Patient taking differently: Take 80 mg by mouth daily.)   ipratropium-albuterol (DUONEB) 0.5-2.5 (3) MG/3ML SOLN Take 3 mLs by nebulization every 6 (six) hours as needed. (Patient not taking: No sig reported)   potassium chloride (KLOR-CON 10) 10 MEQ tablet Take 1 tablet (10 mEq total) by mouth daily.   sacubitril-valsartan (ENTRESTO) 97-103 MG Take 1 tablet by mouth 2 (two) times daily.   tadalafil (CIALIS) 10 MG tablet Take 1-2 tablets (10-20 mg total) by mouth daily as needed for erectile dysfunction.   Tiotropium Bromide-Olodaterol (STIOLTO RESPIMAT) 2.5-2.5 MCG/ACT AERS Inhale 2 puffs into the lungs daily.   No facility-administered encounter medications on file as of 04/16/2021.    Allergies (verified) Nitroglycerin   History: Past Medical History:  Diagnosis Date   Coronary artery disease    a. 3 stents to the LAD, 1 to the PDA, and 1 to the OM3; b. cath 06/2013: chronically occluded LAD, patent stent LAD, D1 20%, pLCx 30%, mLCx 20%,  pRCA 70% (FFR 0.74) s/p PCI/DES, mRCA 70% s/p PCI/DES, RPDA 50%, EF 20% w/ AK of anterolateral & apical walls, moderately elevated LVEDP   Family history of lung cancer    HFrEF (heart failure with reduced ejection fraction) ([place])    a. 05/2014 Echo: EF 25-30%; b. 01/2018 Echo ([facility]): EF 30%.   HLD (hyperlipidemia)    HTN (hypertension)    ICD  single,BSX    a. DOI 11/2011; b. S/N# 664403   Ischemic cardiomyopathy    a. 05/2014 Echo: EF 25-30%; b. 01/2018 Echo ([facility]): EF  30%, sev glob HK w/ apical/ant AK. Mild LVH. Mod LAE, mild RAE. Triv MR/TR.   Personal history of colonic polyps    Pneumonia    08/2018   Syncope and collapse    Ventricular tachycardia ([place]) 11/2011   a. s/p AICD implant    Past Surgical History:  Procedure Laterality Date   appendectomy     CARDIAC CATHETERIZATION  Nov 30 2011   [facility]   CARDIAC DEFIBRILLATOR PLACEMENT  May 2013   COLONOSCOPY WITH PROPOFOL N/A 12/22/2020   Procedure: COLONOSCOPY WITH PROPOFOL;  Surgeon: Jonathon Bellows, MD;  Location: [facility] ENDOSCOPY;  Service: Gastroenterology;  Laterality: N/A;   CORONARY ANGIOPLASTY  2014   x 2 stents   CORONARY STENT PLACEMENT     x7   LEFT HEART CATH AND CORONARY ANGIOGRAPHY Left 08/30/2019   Procedure: LEFT HEART CATH AND CORONARY ANGIOGRAPHY;  Surgeon: Wellington Hampshire, MD;  Location: [facility];  Service: Cardiovascular;  Laterality: Left;   PROSTATE SURGERY     urethra grew around prostate    TUMOR REMOVAL  2001   chest thymic cyst; behind lungs [facility] benign    Family History  Problem Relation Age of Onset   Cancer - Lung Mother    Hypertension Mother    Heart attack Mother    Cancer Father        larynx   Dementia Maternal Grandmother    Heart attack Paternal Grandfather    Colon cancer Neg Hx    Stomach cancer Neg Hx    Pancreatic cancer Neg Hx    Social History   Socioeconomic History   Marital status: Married    Spouse name: Hilda Blades    Number of children: 2   Years of education: Not on file   Highest education level: Not on file  Occupational History   Occupation: retired   Tobacco Use   Smoking status: Former    Packs/day: 0.50    Years: 42.00    Pack years: 21.00    Types: Cigarettes    Quit date: 11/10/2019    Years since quitting: 1.4   Smokeless tobacco: Never  Vaping Use   Vaping Use: Never used  Substance and Sexual Activity   Alcohol use: Yes    Alcohol/week: 8.0 standard drinks    Types: 8 Cans of beer per week    Comment: per week    Drug use: No   Sexual activity: Not on file  Other Topics Concern   Not on file  Social History Narrative   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    Lives in [place]    2 kids daughters    Gets regular exercise   Smoker   [employer]    Social Determinants of Health   Financial Resource Strain: Low Risk    Difficulty  of Paying Living Expenses: Not very hard  Food Insecurity: No Food Insecurity   Worried About [place] in the Last Year: Never true   Ran Out of Food in the Last Year: Never true  Transportation Needs: No Transportation Needs   Lack of Transportation (Medical): No   Lack of Transportation (Non-Medical): No  Physical Activity: Insufficiently Active   Days of Exercise per Week: 4 days   Minutes of Exercise per Session: 20 min  Stress: No Stress Concern Present   Feeling of Stress : Not at all  Social Connections: Unknown   Frequency of Communication with Friends and Family: More than three times a week   Frequency of Social Gatherings with Friends and Family: Not on file   Attends Religious Services: Not on file   Active Member of Clubs or Organizations: Not on file   Attends Archivist Meetings: Not on file   Marital Status: Not on file    Tobacco Counseling Counseling given: Not Answered   Clinical Intake:  Pre-visit preparation completed: Yes        Diabetes: No             Activities of Daily Living In your present state of health, do you have any difficulty performing the following activities: 04/16/2021  Hearing? N  Vision? N  Difficulty concentrating or making decisions? N  Walking or climbing stairs? N  Dressing or bathing? N  Doing errands, shopping? N  Preparing Food and eating ? N  Using the Toilet? N  In the past six months, have you accidently leaked urine? N  Do you have problems with loss of bowel control? N  Managing your  Medications? N  Managing your Finances? N  Housekeeping or managing your Housekeeping? N  Some recent data might be hidden    Patient Care Team: McLean-Scocuzza, Nino Glow, MD as PCP - General (Internal Medicine) Minna Merritts, MD as PCP - Cardiology (Cardiology) Deboraha Sprang, MD as PCP - Electrophysiology (Cardiology) Minna Merritts, MD as Consulting Physician (Cardiology) De Hollingshead, RPH-CPP as Pharmacist (Pharmacist)  Indicate any recent Medical Services you may have received from other than [facility] providers in the past year (date may be approximate).     Assessment:   This is a routine wellness examination for Dat.  I connected with Jachin today by telephone and verified that I am speaking with the correct person using two identifiers. Location patient: home Location provider: work Persons participating in the virtual visit: patient, Marine scientist.    I discussed the limitations, risks, security and privacy concerns of performing an evaluation and management service by telephone and the availability of in person appointments. The patient expressed understanding and verbally consented to this telephonic visit.    Interactive audio and video telecommunications were attempted between this provider and patient, however failed, due to patient having technical difficulties OR patient did not have access to video capability.  We continued and completed visit with audio only.  Some vital signs may be absent or patient reported.   Hearing/Vision screen Hearing Screening - Comments:: Patient is able to hear conversational tones without difficulty.  No issues reported. Vision Screening - Comments:: Wears corrective lenses  They have seen their ophthalmologist in the last 12 months.   Dietary issues and exercise activities discussed: Current Exercise Habits: Home exercise routine, Intensity: Mild Healthy diet Good water intake   Goals Addressed  This  Visit's Progress     Patient Stated     Maintain Healthy Lifestyle (pt-stated)        Stay active Healthy diet       Depression Screen PHQ 2/9 Scores 04/16/2021 04/13/2020 03/03/2020 10/12/2019 04/15/2019 04/13/2019 01/04/2019  PHQ - 2 Score 0 0 0 0 0 0 0  PHQ- 9 Score - - - - 0 - 0    Fall Risk Fall Risk  04/16/2021 02/14/2021 11/14/2020 08/08/2020 06/09/2020  Falls in the past year? 0 0 0 0 0  Number falls in past yr: 0 0 0 0 0  Injury with Fall? 0 0 0 0 0  Follow up Falls evaluation completed Falls evaluation completed Falls evaluation completed Falls evaluation completed Falls evaluation completed    [place]: Adequate lighting in your home to reduce risk of falls? Yes   ASSISTIVE DEVICES UTILIZED TO PREVENT FALLS: Life alert? No  Use of a cane, walker or w/c? No   TIMED UP AND GO: Was the test performed? No .   Cognitive Function:  Patient is alert and oriented x3.    6CIT Screen 04/13/2020 04/13/2019  What Year? 0 points 0 points  What month? 0 points 0 points  What time? 0 points 0 points  Count back from 20 - 0 points  Months in reverse - 0 points    Immunizations Immunization History  Administered Date(s) Administered   Fluad Quad(high Dose 65+) 06/10/2019, 06/09/2020   Influenza,inj,Quad PF,6+ Mos 06/26/2016   Influenza-Unspecified 06/29/2015   PFIZER(Purple Top)SARS-COV-2 Vaccination 12/22/2019, 01/12/2020, 06/02/2020   Pneumococcal Polysaccharide-23 06/29/2015, 09/09/2018   Tdap 04/22/2019, 03/03/2020   Singrix vaccine- Due, Education has been provided regarding the importance of this vaccine. Advised may receive this vaccine at local pharmacy or Health Dept. Aware to provide a copy of the vaccination record if obtained from local pharmacy or Health Dept. Verbalized acceptance and understanding. Deferred.   Health Maintenance Health Maintenance  Topic Date Due   HEMOGLOBIN A1C  09/03/2020   OPHTHALMOLOGY EXAM  05/16/2021  (Originally 08/16/1965)   Zoster Vaccines- Shingrix (1 of 2) 07/16/2021 (Originally 08/15/1974)   INFLUENZA VACCINE  10/26/2021 (Originally 02/26/2021)   COVID-19 Vaccine (4 - Booster for [place] series) 02/26/2022 (Originally 08/25/2020)   TETANUS/TDAP  03/03/2030   COLONOSCOPY (Pts 45-52yrs Insurance coverage will need to be confirmed)  12/23/2030   Hepatitis C Screening  Completed   HPV VACCINES  Aged Out   FOOT EXAM  Discontinued   Vision Screening: Recommended annual ophthalmology exams for early detection of glaucoma and other disorders of the eye. Is the patient up to date with their annual eye exam?  Yes   Dental Screening: Recommended annual dental exams for proper oral hygiene  Community Resource Referral / Chronic Care Management: CRR required this visit?  No   CCM required this visit?  No      Plan:   Keep all routine maintenance appointments.   I have personally reviewed and noted the following in the patient's chart:   Medical and social history Use of alcohol, tobacco or illicit drugs  Current medications and supplements including opioid prescriptions. Patient is not currently taking opioid prescriptions. Functional ability and status Nutritional status Physical activity Advanced directives List of other physicians Hospitalizations, surgeries, and ER visits in previous 12 months Vitals Screenings to include cognitive, depression, and falls Referrals and appointments  In addition, I have reviewed and discussed with patient certain preventive protocols, quality metrics,  and best practice recommendations. A written personalized care plan for preventive services as well as general preventive health recommendations were provided to patient via mychart.     Varney Biles, LPN   1/83/6725

## 2021-04-18 ENCOUNTER — Other Ambulatory Visit (INDEPENDENT_AMBULATORY_CARE_PROVIDER_SITE_OTHER): Payer: Medicare Other

## 2021-04-18 ENCOUNTER — Other Ambulatory Visit: Payer: Self-pay

## 2021-04-18 DIAGNOSIS — I10 Essential (primary) hypertension: Secondary | ICD-10-CM

## 2021-04-18 DIAGNOSIS — I5023 Acute on chronic systolic (congestive) heart failure: Secondary | ICD-10-CM | POA: Diagnosis not present

## 2021-04-18 DIAGNOSIS — R7303 Prediabetes: Secondary | ICD-10-CM | POA: Diagnosis not present

## 2021-04-18 LAB — CBC WITH DIFFERENTIAL/PLATELET
Basophils Absolute: 0.1 10*3/uL (ref 0.0–0.1)
Basophils Relative: 0.8 % (ref 0.0–3.0)
Eosinophils Absolute: 0.1 10*3/uL (ref 0.0–0.7)
Eosinophils Relative: 1.5 % (ref 0.0–5.0)
HCT: 47.9 % (ref 39.0–52.0)
Hemoglobin: 16 g/dL (ref 13.0–17.0)
Lymphocytes Relative: 46.9 % — ABNORMAL HIGH (ref 12.0–46.0)
Lymphs Abs: 3.1 10*3/uL (ref 0.7–4.0)
MCHC: 33.3 g/dL (ref 30.0–36.0)
MCV: 89.9 fl (ref 78.0–100.0)
Monocytes Absolute: 0.6 10*3/uL (ref 0.1–1.0)
Monocytes Relative: 9.3 % (ref 3.0–12.0)
Neutro Abs: 2.7 10*3/uL (ref 1.4–7.7)
Neutrophils Relative %: 41.5 % — ABNORMAL LOW (ref 43.0–77.0)
Platelets: 162 10*3/uL (ref 150.0–400.0)
RBC: 5.33 Mil/uL (ref 4.22–5.81)
RDW: 15.1 % (ref 11.5–15.5)
WBC: 6.6 10*3/uL (ref 4.0–10.5)

## 2021-04-18 LAB — LIPID PANEL
Cholesterol: 116 mg/dL (ref 0–200)
HDL: 45.4 mg/dL (ref >39.00)
LDL Cholesterol: 52 mg/dL (ref 0–99)
NonHDL: 70.96
Total CHOL/HDL Ratio: 3
Triglycerides: 93 mg/dL (ref 0.0–149.0)
VLDL: 18.6 mg/dL (ref 0.0–40.0)

## 2021-04-18 LAB — HEMOGLOBIN A1C: Hgb A1c MFr Bld: 5.6 % (ref 4.6–6.5)

## 2021-04-18 LAB — MAGNESIUM: Magnesium: 2.1 mg/dL (ref 1.5–2.5)

## 2021-04-18 NOTE — Addendum Note (Signed)
Addended by: Leeanne Rio on: 04/18/2021 08:10 AM   Modules accepted: Orders

## 2021-04-25 NOTE — Progress Notes (Deleted)
Date:  04/25/2021   ID:  [name], DOB 02/08/1956, MRN [id number]  Patient Location:  [place] [address]   Provider location:   [facility], [place] office  PCP:  McLean-Scocuzza, Nino Glow, MD  Cardiologist:  [facility]   No chief complaint on file.   History of Present Illness:    Ian Mills is a 66 y.o. male who presents via audio/video conferencing for a telehealth visit today.   The patient does not symptoms concerning for COVID-19 infection (fever, chills, cough, or new SHORTNESS OF BREATH).   Patient has a past medical history of Smoking, continues to smoke 1 pack a week coronary artery disease,  3 stents placed to his mid LAD, one stent to the PDA, one stent to the OM 3 in 08/2006  chest pressure during his divorce with stress test at [facility],  cardiac catheterization at [facility], 11/2011 syncope x4 episodes total over the past several years ECHO EF 25 to 30%, 2015 ICD, ejection fraction 30-35%, chronic back and hip pain ICD, ischemic cardiomyopathy He presents today for follow-up of his coronary artery disease,  Heart catheterization last month  Last seen approximately 1 year ago Has not been in the hospital for cardiac issues since that time  Got entresto approval from company  BP elevated at home but has not been checking on a regular basis Reports he will check more  Lost weight over the past year  No chest pain, no significant shortness of breath  Recent strep throat, was treated  Not smoking since last year  active at baseline with no unstable anginal symptoms  Lab work reviewed from November 2021 CR 1.33 HGBA1C 5.4  Other past medical history reviewed Stress test August 10, 2019 showing fixed defects anterior wall   Heart cath 08/30/2019 1. Significant underlying three-vessel coronary artery disease with chronically occluded mid LAD, patent first diagonal stent, patent RCA  stents with mild in-stent restenosis, patent left circumflex stent with moderate in-stent restenosis.  Progression of ostial left circumflex stenosis to 60%. 2.  Left ventricular angiography was not performed due to chronic kidney disease. 3.  Moderately elevated left ventricular end-diastolic pressure between 25 to 30 mmHg.   medical therapy recommended.     Seen in the hospital February 2020 for COPD exacerbation PNA   Previously with back and sciatic pain, improved with cortisone shots, two years ago seen by [facility] for chronic back pain   Prior CV studies:   The following studies were reviewed today:    Past Medical History:  Diagnosis Date   Coronary artery disease    a. 3 stents to the LAD, 1 to the PDA, and 1 to the OM3; b. cath 06/2013: chronically occluded LAD, patent stent LAD, D1 20%, pLCx 30%, mLCx 20%, pRCA 70% (FFR 0.74) s/p PCI/DES, mRCA 70% s/p PCI/DES, RPDA 50%, EF 20% w/ AK of anterolateral & apical walls, moderately elevated LVEDP   Family history of lung cancer    HFrEF (heart failure with reduced ejection fraction) ([place])    a. 05/2014 Echo: EF 25-30%; b. 01/2018 Echo ([facility]): EF 30%.   HLD (hyperlipidemia)    HTN (hypertension)    ICD  single,BSX    a. DOI 11/2011; b. S/N# 390300   Ischemic cardiomyopathy    a. 05/2014 Echo: EF 25-30%; b. 01/2018 Echo ([facility]): EF 30%, sev glob HK w/ apical/ant AK. Mild LVH. Mod  LAE, mild RAE. Triv MR/TR.   Personal history of colonic polyps    Pneumonia    08/2018   Syncope and collapse    Ventricular tachycardia ([place]) 11/2011   a. s/p AICD implant    Past Surgical History:  Procedure Laterality Date   appendectomy     CARDIAC CATHETERIZATION  Nov 30 2011   [facility]   CARDIAC DEFIBRILLATOR PLACEMENT  May 2013   COLONOSCOPY WITH PROPOFOL N/A 12/22/2020   Procedure: COLONOSCOPY WITH PROPOFOL;  Surgeon: Jonathon Bellows, MD;  Location: [facility] ENDOSCOPY;  Service: Gastroenterology;  Laterality: N/A;   CORONARY ANGIOPLASTY   2014   x 2 stents   CORONARY STENT PLACEMENT     x7   LEFT HEART CATH AND CORONARY ANGIOGRAPHY Left 08/30/2019   Procedure: LEFT HEART CATH AND CORONARY ANGIOGRAPHY;  Surgeon: Wellington Hampshire, MD;  Location: [place] CV LAB;  Service: Cardiovascular;  Laterality: Left;   PROSTATE SURGERY     urethra grew around prostate    TUMOR REMOVAL  2001   chest thymic cyst; behind lungs [facility] benign       Allergies:   Nitroglycerin   Social History   Tobacco Use   Smoking status: Former    Packs/day: 0.50    Years: 42.00    Pack years: 21.00    Types: Cigarettes    Quit date: 11/10/2019    Years since quitting: 1.4   Smokeless tobacco: Never  Vaping Use   Vaping Use: Never used  Substance Use Topics   Alcohol use: Yes    Alcohol/week: 8.0 standard drinks    Types: 8 Cans of beer per week    Comment: per week   Drug use: No     Current Outpatient Medications on File Prior to Visit  Medication Sig Dispense Refill   albuterol (VENTOLIN HFA) 108 (90 Base) MCG/ACT inhaler Inhale 1 puff into the lungs every 6 (six) hours as needed for wheezing or shortness of breath. (Patient not taking: No sig reported)     ALPRAZolam (XANAX) 0.5 MG tablet Take 1 tablet (0.5 mg total) by mouth 3 (three) times daily as needed. for anxiety 90 tablet 5   amiodarone (PACERONE) 200 MG tablet Take 1 tablet (200 mg total) by mouth daily. 135 tablet 0   aspirin 81 MG EC tablet Take 1 tablet (81 mg total) by mouth daily. 30 tablet 6   atorvastatin (LIPITOR) 80 MG tablet Take 1 tablet (80 mg total) by mouth daily. 90 tablet 3   carvedilol (COREG) 3.125 MG tablet Take 1 tablet (3.125 mg total) by mouth 2 (two) times daily. 180 tablet 3   clopidogrel (PLAVIX) 75 MG tablet Take 1 tablet by mouth once daily 90 tablet 0   ezetimibe (ZETIA) 10 MG tablet Take 1 tablet (10 mg total) by mouth daily. 90 tablet 3   furosemide (LASIX) 40 MG tablet Take 1 tablet by mouth twice daily (Patient taking differently: Take 80 mg  by mouth daily.) 180 tablet 3   ipratropium-albuterol (DUONEB) 0.5-2.5 (3) MG/3ML SOLN Take 3 mLs by nebulization every 6 (six) hours as needed. (Patient not taking: No sig reported) 360 mL 11   potassium chloride (KLOR-CON 10) 10 MEQ tablet Take 1 tablet (10 mEq total) by mouth daily. 90 tablet 3   sacubitril-valsartan (ENTRESTO) 97-103 MG Take 1 tablet by mouth 2 (two) times daily. 180 tablet 0   tadalafil (CIALIS) 10 MG tablet Take 1-2 tablets (10-20 mg total) by mouth daily as  needed for erectile dysfunction. 30 tablet 11   Tiotropium Bromide-Olodaterol (STIOLTO RESPIMAT) 2.5-2.5 MCG/ACT AERS Inhale 2 puffs into the lungs daily. 4 g 0   No current facility-administered medications on file prior to visit.     Family Hx: The patient's family history includes Cancer in his father; Cancer - Lung in his mother; Dementia in his maternal grandmother; Heart attack in his mother and paternal grandfather; Hypertension in his mother. There is no history of Colon cancer, Stomach cancer, or Pancreatic cancer.  ROS:   Please see the history of present illness.    Review of Systems  Constitutional: Negative.   HENT: Negative.    Respiratory: Negative.    Cardiovascular: Negative.   Gastrointestinal: Negative.   Musculoskeletal: Negative.   Neurological: Negative.   Psychiatric/Behavioral: Negative.    All other systems reviewed and are negative.   Labs/Other Tests and Data Reviewed:    Recent Labs: 04/06/2021: ALT 33; BUN 20; Creatinine, Ser 1.38; Potassium 4.9; Sodium 139; TSH 2.660 04/18/2021: Hemoglobin 16.0; Magnesium 2.1; Platelets 162.0   Recent Lipid Panel Lab Results  Component Value Date/Time   CHOL 116 04/18/2021 10:38 AM   CHOL 170 12/09/2016 11:03 AM   CHOL 111 01/20/2014 09:29 AM   TRIG 93.0 04/18/2021 10:38 AM   TRIG 88 01/20/2014 09:29 AM   HDL 45.40 04/18/2021 10:38 AM   HDL 53 12/09/2016 11:03 AM   HDL 39 (L) 01/20/2014 09:29 AM   CHOLHDL 3 04/18/2021 10:38 AM   LDLCALC  52 04/18/2021 10:38 AM   LDLCALC 72 03/03/2020 02:17 PM   LDLCALC 54 01/20/2014 09:29 AM    Wt Readings from Last 3 Encounters:  04/16/21 203 lb (92.1 kg)  03/13/21 203 lb (92.1 kg)  02/14/21 205 lb 3.2 oz (93.1 kg)     Exam:    Vital Signs: Vital signs may also be detailed in the HPI There were no vitals taken for this visit.  Wt Readings from Last 3 Encounters:  04/16/21 203 lb (92.1 kg)  03/13/21 203 lb (92.1 kg)  02/14/21 205 lb 3.2 oz (93.1 kg)   Temp Readings from Last 3 Encounters:  02/14/21 97.9 F (36.6 C) (Oral)  12/22/20 (!) 96.2 F (35.7 C) (Temporal)  06/09/20 97.7 F (36.5 C) (Oral)   BP Readings from Last 3 Encounters:  03/13/21 140/80  02/14/21 134/82  12/22/20 116/66   Pulse Readings from Last 3 Encounters:  03/13/21 (!) 55  02/14/21 61  12/22/20 (!) 64     Well nourished, well developed male in no acute distress. Constitutional:  oriented to person, place, and time. No distress.  Head: Normocephalic and atraumatic.  Eyes:  no discharge. No scleral icterus.  Neck: Normal range of motion. Neck supple.  Pulmonary/Chest: No audible wheezing, no distress, appears comfortable Musculoskeletal: Normal range of motion.  no  tenderness or deformity.  Neurological:   Coordination normal. Full exam not performed Skin:  No rash Psychiatric:  normal mood and affect. behavior is normal. Thought content normal.    ASSESSMENT & PLAN:    Problem List Items Addressed This Visit   None Ischemic cardiomyopathy Reports he is euvolemic, denies unstable angina symptoms Needs follow-up office visit with EP, ICD in place We will continue current medications as above Entresto renewed through the company patient assistance  Essential hypertension Reports numbers high today but has not been checking it on a regular basis at home Has general malaise today sinus with some wheezing He will monitor blood pressure at  home and call us at the end of the week into next  week with some numbers Further medication adjustment may be needed if systolic pressure continues to run high, this was discussed with him in detail  History of VT Denies any recent tachypalpitations, no recent ICD shocks No real hospitalizations  Hyperlipidemia Numbers improving, reports compliance with his statin  Coronary disease with stable angina Reports he is active, denies unstable angina, suggested he continue regular exercise program Non-smoker, cholesterol close to goal   COVID-19 Education: The signs and symptoms of COVID-19 were discussed with the patient and how to seek care for testing (follow up with PCP or arrange E-visit).  The importance of social distancing was discussed today.  Patient Risk:   After full review of this patients clinical status, I feel that they are at least moderate risk at this time.  Time:   Today, I have spent 25 minutes with the patient with telehealth technology discussing the cardiac and medical problems/diagnoses detailed above   Additional 10 min spent reviewing the chart prior to patient visit today   Medication Adjustments/Labs and Tests Ordered: Current medicines are reviewed at length with the patient today.  Concerns regarding medicines are outlined above.   Tests Ordered: No tests ordered   Medication Changes: No changes made    Signed, Ida Rogue, MD  [place] Office [address] [id number], [place], [place] [address]

## 2021-04-27 ENCOUNTER — Ambulatory Visit: Payer: Medicare Other | Admitting: Cardiovascular Disease

## 2021-05-02 ENCOUNTER — Ambulatory Visit (INDEPENDENT_AMBULATORY_CARE_PROVIDER_SITE_OTHER): Payer: Medicare Other | Admitting: Cardiovascular Disease

## 2021-05-02 ENCOUNTER — Encounter: Payer: Self-pay | Admitting: Cardiovascular Disease

## 2021-05-02 ENCOUNTER — Other Ambulatory Visit: Payer: Self-pay

## 2021-05-02 VITALS — BP 118/60 | HR 64 | Ht 68.0 in | Wt 198.1 lb

## 2021-05-02 DIAGNOSIS — I25118 Atherosclerotic heart disease of native coronary artery with other forms of angina pectoris: Secondary | ICD-10-CM | POA: Diagnosis not present

## 2021-05-02 DIAGNOSIS — I255 Ischemic cardiomyopathy: Secondary | ICD-10-CM

## 2021-05-02 DIAGNOSIS — I472 Ventricular tachycardia, unspecified: Secondary | ICD-10-CM

## 2021-05-02 DIAGNOSIS — I5022 Chronic systolic (congestive) heart failure: Secondary | ICD-10-CM | POA: Diagnosis not present

## 2021-05-02 DIAGNOSIS — I700 Atherosclerosis of aorta: Secondary | ICD-10-CM | POA: Diagnosis not present

## 2021-05-02 DIAGNOSIS — I10 Essential (primary) hypertension: Secondary | ICD-10-CM | POA: Diagnosis not present

## 2021-05-02 MED ORDER — CARVEDILOL 3.125 MG PO TABS: 3.1250 mg | ORAL_TABLET | Freq: Two times a day (BID) | ORAL | 3 refills | Status: DC

## 2021-05-02 NOTE — Progress Notes (Signed)
Date:  05/02/2021   ID:  Ian Mills, DOB Feb 10, 1956, MRN [id number]  Patient Location:  [place] [address]   Provider location:   [facility], [place] office  PCP:  McLean-Scocuzza, Nino Glow, MD  Cardiologist:  Patsy Baltimore   Chief Complaint  Patient presents with   Other    6 Month f/u no complaints today. Meds reviewed verbally with pt.    History of Present Illness:    Ian Mills is a 65 y.o. male past medical history of Smoking, continues to smoke 1 pack a week coronary artery disease,  3 stents placed to his mid LAD, one stent to the PDA, one stent to the OM 3 in 08/2006  chest pressure during his divorce with stress test at [facility],  cardiac catheterization at [facility], 11/2011 syncope x4 episodes total over the past several years ECHO EF 25 to 30%, 2015 ICD, ejection fraction 30-35%, chronic back and hip pain ICD, ischemic cardiomyopathy He presents today for follow-up of his coronary artery disease,  Heart catheterization last month  Seen in clinic February 2021 catheterization at that time 1. Significant underlying three-vessel coronary artery disease with chronically occluded mid LAD, patent first diagonal stent, patent RCA stents with mild in-stent restenosis, patent left circumflex stent with moderate in-stent restenosis.  Progression of ostial left circumflex stenosis to 60%. 2.  Left ventricular angiography was not performed due to chronic kidney disease. 3.  Moderately elevated left ventricular end-diastolic pressure between 25 to 30 mmHg. No  revascularization recommended  Quit smoking 08/2020 Total chol 116, A1C 5.6 CR 1.38  No angina, active , fishing, kayaking Denies SOB entresto from company  No recent echo, last in 2019, severely depressed EF  EKG personally reviewed by myself on todays visit Normal sinus rhythm rate 64 bpm old anterior MI   Other past medical history  reviewed Stress test August 10, 2019 showing fixed defects anterior wall   Heart cath 08/30/2019 1. Significant underlying three-vessel coronary artery disease with chronically occluded mid LAD, patent first diagonal stent, patent RCA stents with mild in-stent restenosis, patent left circumflex stent with moderate in-stent restenosis.  Progression of ostial left circumflex stenosis to 60%. 2.  Left ventricular angiography was not performed due to chronic kidney disease. 3.  Moderately elevated left ventricular end-diastolic pressure between 25 to 30 mmHg.   medical therapy recommended.     Seen in the hospital February 2020 for COPD exacerbation PNA   Previously with back and sciatic pain, improved with cortisone shots, two years ago seen by [facility] for chronic back pain    Past Medical History:  Diagnosis Date   Coronary artery disease    a. 3 stents to the LAD, 1 to the PDA, and 1 to the OM3; b. cath 06/2013: chronically occluded LAD, patent stent LAD, D1 20%, pLCx 30%, mLCx 20%, pRCA 70% (FFR 0.74) s/p PCI/DES, mRCA 70% s/p PCI/DES, RPDA 50%, EF 20% w/ AK of anterolateral & apical walls, moderately elevated LVEDP   Family history of lung cancer    HFrEF (heart failure with reduced ejection fraction) ([name])    a. 05/2014 Echo: EF 25-30%; b. 01/2018 Echo ([facility]): EF 30%.   HLD (hyperlipidemia)    HTN (hypertension)    ICD  single,BSX    a. DOI 11/2011; b. S/N# 300923   Ischemic cardiomyopathy    a. 05/2014 Echo: EF 25-30%; b. 01/2018 Echo (  [facility]): EF 30%, sev glob HK w/ apical/ant AK. Mild LVH. Mod LAE, mild RAE. Triv MR/TR.   Personal history of colonic polyps    Pneumonia    08/2018   Syncope and collapse    Ventricular tachycardia 11/2011   a. s/p AICD implant    Past Surgical History:  Procedure Laterality Date   appendectomy     CARDIAC CATHETERIZATION  Nov 30 2011   [facility]   CARDIAC DEFIBRILLATOR PLACEMENT  May 2013   COLONOSCOPY WITH PROPOFOL N/A 12/22/2020    Procedure: COLONOSCOPY WITH PROPOFOL;  Surgeon: Jonathon Bellows, MD;  Location: [facility] ENDOSCOPY;  Service: Gastroenterology;  Laterality: N/A;   CORONARY ANGIOPLASTY  2014   x 2 stents   CORONARY STENT PLACEMENT     x7   LEFT HEART CATH AND CORONARY ANGIOGRAPHY Left 08/30/2019   Procedure: LEFT HEART CATH AND CORONARY ANGIOGRAPHY;  Surgeon: Wellington Hampshire, MD;  Location: [place] [facility];  Service: Cardiovascular;  Laterality: Left;   PROSTATE SURGERY     urethra grew around prostate    TUMOR REMOVAL  2001   chest thymic cyst; behind lungs [facility] benign       Allergies:   Nitroglycerin   Social History   Tobacco Use   Smoking status: Former    Packs/day: 0.50    Years: 42.00    Pack years: 21.00    Types: Cigarettes    Quit date: 11/10/2019    Years since quitting: 1.4   Smokeless tobacco: Never  Vaping Use   Vaping Use: Never used  Substance Use Topics   Alcohol use: Yes    Alcohol/week: 8.0 standard drinks    Types: 8 Cans of beer per week    Comment: per week   Drug use: No     Current Outpatient Medications on File Prior to Visit  Medication Sig Dispense Refill   albuterol (VENTOLIN HFA) 108 (90 Base) MCG/ACT inhaler Inhale 1 puff into the lungs every 6 (six) hours as needed for wheezing or shortness of breath.     ALPRAZolam (XANAX) 0.5 MG tablet Take 1 tablet (0.5 mg total) by mouth 3 (three) times daily as needed. for anxiety 90 tablet 5   amiodarone (PACERONE) 200 MG tablet Take 1 tablet (200 mg total) by mouth daily. 135 tablet 0   aspirin 81 MG EC tablet Take 1 tablet (81 mg total) by mouth daily. 30 tablet 6   atorvastatin (LIPITOR) 80 MG tablet Take 1 tablet (80 mg total) by mouth daily. 90 tablet 3   carvedilol (COREG) 3.125 MG tablet Take 1 tablet (3.125 mg total) by mouth 2 (two) times daily. 180 tablet 3   clopidogrel (PLAVIX) 75 MG tablet Take 1 tablet by mouth once daily 90 tablet 0   ezetimibe (ZETIA) 10 MG tablet Take 1 tablet (10 mg total) by mouth  daily. 90 tablet 3   furosemide (LASIX) 40 MG tablet Take 1 tablet by mouth twice daily (Patient taking differently: Take 80 mg by mouth daily.) 180 tablet 3   ipratropium-albuterol (DUONEB) 0.5-2.5 (3) MG/3ML SOLN Take 3 mLs by nebulization every 6 (six) hours as needed. 360 mL 11   potassium chloride (KLOR-CON 10) 10 MEQ tablet Take 1 tablet (10 mEq total) by mouth daily. 90 tablet 3   sacubitril-valsartan (ENTRESTO) 97-103 MG Take 1 tablet by mouth 2 (two) times daily. 180 tablet 0   tadalafil (CIALIS) 10 MG tablet Take 1-2 tablets (10-20 mg total) by mouth daily as needed  for erectile dysfunction. 30 tablet 11   Tiotropium Bromide-Olodaterol (STIOLTO RESPIMAT) 2.5-2.5 MCG/ACT AERS Inhale 2 puffs into the lungs daily. 4 g 0   No current facility-administered medications on file prior to visit.     Family Hx: The patient's family history includes Cancer in his father; Cancer - Lung in his mother; Dementia in his maternal grandmother; Heart attack in his mother and paternal grandfather; Hypertension in his mother. There is no history of Colon cancer, Stomach cancer, or Pancreatic cancer.  ROS:   Please see the history of present illness.    Review of Systems  Constitutional: Negative.   HENT: Negative.    Respiratory: Negative.    Cardiovascular: Negative.   Gastrointestinal: Negative.   Musculoskeletal: Negative.   Neurological: Negative.   Psychiatric/Behavioral: Negative.    All other systems reviewed and are negative.   Labs/Other Tests and Data Reviewed:    Recent Labs: 04/06/2021: ALT 33; BUN 20; Creatinine, Ser 1.38; Potassium 4.9; Sodium 139; TSH 2.660 04/18/2021: Hemoglobin 16.0; Magnesium 2.1; Platelets 162.0   Recent Lipid Panel Lab Results  Component Value Date/Time   CHOL 116 04/18/2021 10:38 AM   CHOL 170 12/09/2016 11:03 AM   CHOL 111 01/20/2014 09:29 AM   TRIG 93.0 04/18/2021 10:38 AM   TRIG 88 01/20/2014 09:29 AM   HDL 45.40 04/18/2021 10:38 AM   HDL 53  12/09/2016 11:03 AM   HDL 39 (L) 01/20/2014 09:29 AM   CHOLHDL 3 04/18/2021 10:38 AM   LDLCALC 52 04/18/2021 10:38 AM   LDLCALC 72 03/03/2020 02:17 PM   LDLCALC 54 01/20/2014 09:29 AM    Wt Readings from Last 3 Encounters:  05/02/21 198 lb 2 oz (89.9 kg)  04/16/21 203 lb (92.1 kg)  03/13/21 203 lb (92.1 kg)     Exam:    Vital Signs: Vital signs may also be detailed in the HPI BP 118/60 (BP Location: Left Arm, Patient Position: Sitting, Cuff Size: Normal)   Pulse 64   Ht 5\' 8"  (1.727 m)   Wt 198 lb 2 oz (89.9 kg)   SpO2 98%   BMI 30.12 kg/m    Constitutional:  oriented to person, place, and time. No distress.  HENT:  Head: Grossly normal Eyes:  no discharge. No scleral icterus.  Neck: No JVD, no carotid bruits  Cardiovascular: Regular rate and rhythm, no murmurs appreciated Pulmonary/Chest: Clear to auscultation bilaterally, no wheezes or rails Abdominal: Soft.  no distension.  no tenderness.  Musculoskeletal: Normal range of motion Neurological:  normal muscle tone. Coordination normal. No atrophy Skin: Skin warm and dry Psychiatric: normal affect, pleasant   ASSESSMENT & PLAN:    Problem List Items Addressed This Visit       Cardiology Problems   Aortic atherosclerosis (HCC)   Essential hypertension   Chronic systolic congestive heart failure (HCC)   CAD (coronary artery disease) - Primary   Ventricular tachycardia   Ischemic cardiomyopathy   Ischemic cardiomyopathy Looks euvolemic, reports he feels euvolemic Denies anginal symptoms Prior catheterization early 2021 reviewed with him, medical management recommended Reports he is non-smoker, cholesterol at goal, no diabetes Repeat echocardiogram ordered to evaluate ejection fraction, guide further medications Does not appear to be on spironolactone, SGLT2 inhibitor.  could be added pending results though he is reluctant to add medications  Essential hypertension Blood pressure is well controlled on today's  visit. No changes made to the medications.  History of VT Denies any recent tachypalpitations, no recent ICD shocks Followed by EP  Hyperlipidemia Cholesterol is at goal on the current lipid regimen. No changes to the medications were made.  Coronary disease with stable angina Recent catheterization results discussed with him No tests ordered   Total encounter time more than 25 minutes  Greater than 50% was spent in counseling and coordination of care with the patient     Signed, Ida Rogue, [place] Office [id number], [place], [place] [address]

## 2021-05-02 NOTE — Patient Instructions (Addendum)
Medication Instructions:  No changes  If you need a refill on your cardiac medications before your next appointment, please call your pharmacy.   Lab work: No new labs needed  Testing/Procedures: Your physician has requested that you have an echocardiogram. Echocardiography is a painless test that uses sound waves to create images of your heart. It provides your doctor with information about the size and shape of your heart and how well your heart's chambers and valves are working. This procedure takes approximately one hour. There are no restrictions for this procedure.  There is a possibility that an IV may need to be started during your test to inject an image enhancing agent. This is done to obtain more optimal pictures of your heart. Therefore we ask that you do at least drink some water prior to coming in to hydrate your veins.    Follow-Up: At [facility], you and your health needs are our priority.  As part of our continuing mission to provide you with exceptional heart care, we have created designated Provider Care Teams.  These Care Teams include your primary Cardiologist (physician) and Advanced Practice Providers (APPs -  Physician Assistants and Nurse Practitioners) who all work together to provide you with the care you need, when you need it.  You will need a follow up appointment in 6 months  Providers on your designated Care Team:   Murray Hodgkins, NP Christell Faith, PA-C Marrianne Mood, PA-C Cadence Scotts, [place]  COVID-19 Vaccine Information can be found at: ShippingScam.co.uk For questions related to vaccine distribution or appointments, please email vaccine@[email or web address] .[email or web address] or call [phone].

## 2021-05-24 NOTE — Telephone Encounter (Signed)
Called pt in reference to his MRI he states he is not having the MRI due to it being too risky. Patient states he had a CT done approximately 4 weeks ago at [facility] in [place]. Patient states they saw a bone spur on his Left heel. Patient number is [phone] if we have any questions.

## 2021-05-28 ENCOUNTER — Telehealth: Payer: Self-pay | Admitting: Internal Medicine

## 2021-05-28 ENCOUNTER — Ambulatory Visit (INDEPENDENT_AMBULATORY_CARE_PROVIDER_SITE_OTHER): Payer: Medicare Other | Admitting: Pharmacist

## 2021-05-28 DIAGNOSIS — J449 Chronic obstructive pulmonary disease, unspecified: Secondary | ICD-10-CM | POA: Diagnosis not present

## 2021-05-28 DIAGNOSIS — I5022 Chronic systolic (congestive) heart failure: Secondary | ICD-10-CM | POA: Diagnosis not present

## 2021-05-28 DIAGNOSIS — I25118 Atherosclerotic heart disease of native coronary artery with other forms of angina pectoris: Secondary | ICD-10-CM | POA: Diagnosis not present

## 2021-05-28 DIAGNOSIS — R7303 Prediabetes: Secondary | ICD-10-CM

## 2021-05-28 NOTE — Patient Instructions (Signed)
Visit Information  PATIENT GOALS:  Goals Addressed               This Visit's Progress     Patient Stated     Medication Monitoring (pt-stated)        Patient Goals/Self-Care Activities Over the next 90 days, patient will:  - take medications as prescribed focus on medication adherence by collaboration to investigate insurance  weigh daily, and contact provider if weight gain of >3 lbs/day or >5 lbs/week collaborate with provider on medication access solutions        Patient verbalizes understanding of instructions provided today and agrees to view in [place].   Plan: Telephone follow up appointment with care management team member scheduled for:  5 weeks  Catie Darnelle Maffucci, PharmD, [place], [place] Clinical Pharmacist [employer] at [employer] [phone]

## 2021-05-28 NOTE — Telephone Encounter (Signed)
Pt called in requesting a note to be sent to Pharmacist. Pt would like Cattie to give him a call back today or tomorrow so he can get some information about medication he is on.

## 2021-05-28 NOTE — Chronic Care Management (AMB) (Signed)
Chronic Care Management CCM Pharmacy Note  05/28/2021 Name:  Ian Mills MRN:  [id number] DOB:  01-Dec-1955  Subjective: Ian Mills is an 65 y.o. year old male who is a primary patient of McLean-Scocuzza, Nino Glow, MD.  The CCM team was consulted for assistance with disease management and care coordination needs.    Engaged with patient by telephone for follow up visit for pharmacy case management and/or care coordination services.   Objective:  Medications Reviewed Today     Reviewed by Britt Bottom, CMA (Certified Medical Assistant) on 05/02/21 at 1403  Med List Status: <None>   Medication Order Taking? Sig Documenting Provider Last Dose Status Informant  albuterol (VENTOLIN HFA) 108 (90 Base) MCG/ACT inhaler [id number]  Inhale 1 puff into the lungs every 6 (six) hours as needed for wheezing or shortness of breath.  Patient not taking: No sig reported   [provider]  Active            Med Note Darnelle Maffucci, Waynette Buttery Aug 16, 2019  9:11 AM)    ALPRAZolam Duanne Moron) 0.5 MG tablet [id number]  Take 1 tablet (0.5 mg total) by mouth 3 (three) times daily as needed. for anxiety McLean-Scocuzza, Nino Glow, MD  Active            Med Note Darnelle Maffucci, Arville Lime   Wed Mar 07, 2021 11:21 AM) 2-3 times daily  amiodarone (PACERONE) 200 MG tablet [id number]  Take 1 tablet (200 mg total) by mouth daily. Deboraha Sprang, MD  Active   aspirin 81 MG EC tablet [id number]  Take 1 tablet (81 mg total) by mouth daily. Minna Merritts, MD  Active Self  atorvastatin (LIPITOR) 80 MG tablet [id number]  Take 1 tablet (80 mg total) by mouth daily. McLean-Scocuzza, Nino Glow, MD  Active   carvedilol (COREG) 3.125 MG tablet [id number]  Take 1 tablet (3.125 mg total) by mouth 2 (two) times daily. McLean-Scocuzza, Nino Glow, MD  Active   clopidogrel (PLAVIX) 75 MG tablet [id number]  Take 1 tablet by mouth once daily McLean-Scocuzza, Nino Glow, MD  Active   ezetimibe (ZETIA) 10 MG tablet [id number]  Take 1  tablet (10 mg total) by mouth daily. McLean-Scocuzza, Nino Glow, MD  Active   furosemide (LASIX) 40 MG tablet [id number]  Take 1 tablet by mouth twice daily  Patient taking differently: Take 80 mg by mouth daily.   Minna Merritts, MD  Active            Med Note Danne Baxter, [place] Hun   Tue Mar 13, 2021 11:24 AM)    ipratropium-albuterol (DUONEB) 0.5-2.5 (3) MG/3ML SOLN [id number]  Take 3 mLs by nebulization every 6 (six) hours as needed.  Patient not taking: No sig reported   McLean-Scocuzza, Nino Glow, MD  Active   potassium chloride (KLOR-CON 10) 10 MEQ tablet [id number]  Take 1 tablet (10 mEq total) by mouth daily. McLean-Scocuzza, Nino Glow, MD  Active   sacubitril-valsartan (ENTRESTO) 97-103 MG [id number]  Take 1 tablet by mouth 2 (two) times daily. Theora Gianotti, NP  Active   tadalafil (CIALIS) 10 MG tablet [id number]  Take 1-2 tablets (10-20 mg total) by mouth daily as needed for erectile dysfunction. Billey Co, MD  Active   Tiotropium Bromide-Olodaterol (STIOLTO RESPIMAT) 2.5-2.5 MCG/ACT AERS [id number]  Inhale 2 puffs into the lungs daily. Tyler Pita, MD  Active Self            Pertinent  Labs:   Lab Results  Component Value Date   HGBA1C 5.6 04/18/2021   Lab Results  Component Value Date   CHOL 116 04/18/2021   HDL 45.40 04/18/2021   LDLCALC 52 04/18/2021   TRIG 93.0 04/18/2021   CHOLHDL 3 04/18/2021   Lab Results  Component Value Date   CREATININE 1.38 (H) 04/06/2021   BUN 20 04/06/2021   NA 139 04/06/2021   K 4.9 04/06/2021   CL 104 04/06/2021   CO2 18 (L) 04/06/2021     SDOH:  (Social Determinants of Health) assessments and interventions performed: Yes SDOH Interventions    Flowsheet Row Most Recent Value  SDOH Interventions   Financial Strain Interventions Other (Comment)  [medication assistance]       CCM Care Plan  Review of patient past medical history, allergies, medications, health status, including review of consultants reports,  laboratory and other test data, was performed as part of comprehensive evaluation and provision of chronic care management services.   Conditions to be addressed/monitored:  Hypertension, Hyperlipidemia, Heart Failure, Coronary Artery Disease, and COPD  Care Plan : Medication Management  Updates made by De Hollingshead, RPH-CPP since 05/28/2021 12:00 AM     Problem: CHF, COPD      Long-Range Goal: Disease Progression Prevention   This Visit's Progress: On track  Recent Progress: On track  Priority: High  Note:   Current Barriers:  Unable to independently afford treatment regimen Complex patient with multiple comorbidities and low health literacy  Pharmacist Clinical Goal(s):  Over the next 90 days, patient will verbalize ability to afford treatment regimen. Over the next 90 days, patient will adhere to prescribed medication regimen.  Interventions: 1:1 collaboration with McLean-Scocuzza, Nino Glow, MD regarding development and update of comprehensive plan of care as evidenced by provider attestation and co-signature Inter-disciplinary care team collaboration (see longitudinal plan of care) Comprehensive medication review performed; medication list updated in electronic medical record   HFrEF s/p AICD placement (most recent EF 25-30%): Appropriately managed; current treatment: Entresto 97/103 mg BID, carvedilol 3.125 mg BID, furosemide 80 mg daily (2 40 mg tabs at once), potassium 10 mEq daily; follows w/ Dr. Rockey Situ Ventricular tachycardia: amiodarone 300 mg daily - Dr. Caryl Comes; Approved for PAP through [employer] for [employer]xair through 2022.  Will collaborate w/ CPhT, patient, and providers to reapply for patient assistance for Enresto for 2023.   CAD (s/p prior stenting), Hyperlipidemia: Controlled;  current treatment: atorvastatin 80 mg daily, ezetimibe 10 mg daily Antiplatelet regimen: aspirin 81 mg daily, clopidogrel 75 mg daily Previously recommended to continue current  regimen at this time.   Chronic Obstructive Pulmonary Disease: Controlled; current treatment: Stiolto 2.5/2.5 mcg, 2 puffs daily; no use of albuterol or nebulizer recently.  Tobacco free since 10/2019. Praised for continued abstinence.  Approved for PAP through [employer] for Stiolto through 2022 Will collaborate w/ CPhT, patient, and providers to reapply for patient assistance for [place] for 2023.  Anxiety: Inappropriately controlled; current treatment: alprazolam 0.125 mg up to TID PRN, patient taking BID-TID for anxiety Continue to reiterate risks of regular benzodiazepine use. Patient declined addition of SSRI/SNRI at prior PCP visit Reports today that he and his wife are separating. Continue to follow to support. Reports wanting help navigating HCPOA and having her removed. Will collaborate w/ RN CM for this.   Patient Goals/Self-Care Activities Over the next 90 days, patient will:  - take medications as prescribed focus on medication adherence by collaboration to [employer]  weigh daily, and  contact provider if weight gain of >3 lbs/day or >5 lbs/week collaborate with provider on medication access solutions  Follow Up Plan: Telephone follow up appointment with care management team member scheduled for:~ 12 weeks       Plan: Telephone follow up appointment with care management team member scheduled for:  76 weeks  Catie Darnelle Maffucci, PharmD, [place], [place] Pharmacist [employer] at [employer] [phone]

## 2021-05-28 NOTE — Telephone Encounter (Signed)
Returned call. See CCM documentation 

## 2021-05-29 DIAGNOSIS — M272 Inflammatory conditions of jaws: Secondary | ICD-10-CM | POA: Diagnosis not present

## 2021-05-30 ENCOUNTER — Telehealth: Payer: Medicare Other

## 2021-06-05 ENCOUNTER — Other Ambulatory Visit: Payer: Medicare Other

## 2021-06-06 ENCOUNTER — Telehealth: Payer: Self-pay

## 2021-06-06 ENCOUNTER — Telehealth: Payer: Self-pay | Admitting: Pharmacy Technician

## 2021-06-06 DIAGNOSIS — Z596 Low income: Secondary | ICD-10-CM

## 2021-06-06 MED ORDER — ENTRESTO 97-103 MG PO TABS: 1.0000 | ORAL_TABLET | Freq: Two times a day (BID) | ORAL | 3 refills | Status: DC

## 2021-06-06 NOTE — Progress Notes (Signed)
[facility])                                            [place] Team    06/06/2021  Ian Mills [birth year] [id number]  FOR 2023 RE ENROLLMENT                                      Medication Assistance Referral  Referral From: [facility] Embedded RPh Catie T.   Medication/Company: Delene Loll / [employer] Patient application portion:  Mailed Provider application portion: Faxed  to Dr. Rockey Situ Provider address/fax verified via: Office website  Medication/Company: Beau Fanny / [employer] Patient application portion:  Mailed Provider application portion:  N/A signed in clinic  to Dr. Terese Door Provider address/fax verified via: Office website   Ian Kiernan P. Chloe Baig, [place]  [phone]

## 2021-06-06 NOTE — Telephone Encounter (Signed)
Fax received from [employer] PA application for Praxair 97-103 mg BID Needing script and MD signature  Faxed PA Application Armed forces operational officer) back to [employer], CPhT as requested.

## 2021-06-10 ENCOUNTER — Other Ambulatory Visit: Payer: Self-pay | Admitting: Cardiovascular Disease

## 2021-06-11 ENCOUNTER — Other Ambulatory Visit: Payer: Self-pay | Admitting: *Deleted

## 2021-06-11 ENCOUNTER — Encounter: Payer: Self-pay | Admitting: *Deleted

## 2021-06-11 DIAGNOSIS — Z79899 Other long term (current) drug therapy: Secondary | ICD-10-CM

## 2021-06-11 DIAGNOSIS — I472 Ventricular tachycardia, unspecified: Secondary | ICD-10-CM

## 2021-06-11 NOTE — Progress Notes (Signed)
LFT order

## 2021-06-12 ENCOUNTER — Ambulatory Visit: Payer: Medicare Other

## 2021-06-13 DIAGNOSIS — Z87891 Personal history of nicotine dependence: Secondary | ICD-10-CM | POA: Diagnosis not present

## 2021-06-13 DIAGNOSIS — L03211 Cellulitis of face: Secondary | ICD-10-CM | POA: Diagnosis not present

## 2021-06-13 DIAGNOSIS — Z792 Long term (current) use of antibiotics: Secondary | ICD-10-CM | POA: Diagnosis not present

## 2021-06-25 ENCOUNTER — Telehealth: Payer: Self-pay | Admitting: Internal Medicine

## 2021-06-25 NOTE — Telephone Encounter (Signed)
Will see patient tomorrow while he is in office.

## 2021-06-25 NOTE — Telephone Encounter (Signed)
Patient called and would like a call back from Catie. He needs help on renewal of medications.

## 2021-06-26 ENCOUNTER — Other Ambulatory Visit: Payer: Self-pay

## 2021-06-26 ENCOUNTER — Other Ambulatory Visit
Admission: RE | Admit: 2021-06-26 | Discharge: 2021-06-26 | Disposition: A | Discharge status: Home / Self Care | Payer: Medicare Other | Attending: Internal Medicine | Admitting: Internal Medicine

## 2021-06-26 ENCOUNTER — Ambulatory Visit (INDEPENDENT_AMBULATORY_CARE_PROVIDER_SITE_OTHER): Payer: Medicare Other | Admitting: Internal Medicine

## 2021-06-26 ENCOUNTER — Encounter: Payer: Self-pay | Admitting: Internal Medicine

## 2021-06-26 VITALS — BP 134/80 | HR 57 | Temp 97.2°F | Ht 68.0 in | Wt 195.6 lb

## 2021-06-26 DIAGNOSIS — F419 Anxiety disorder, unspecified: Secondary | ICD-10-CM | POA: Diagnosis not present

## 2021-06-26 DIAGNOSIS — Z125 Encounter for screening for malignant neoplasm of prostate: Secondary | ICD-10-CM | POA: Diagnosis not present

## 2021-06-26 DIAGNOSIS — E785 Hyperlipidemia, unspecified: Secondary | ICD-10-CM | POA: Diagnosis not present

## 2021-06-26 DIAGNOSIS — T148XXA Other injury of unspecified body region, initial encounter: Secondary | ICD-10-CM | POA: Diagnosis not present

## 2021-06-26 DIAGNOSIS — I25118 Atherosclerotic heart disease of native coronary artery with other forms of angina pectoris: Secondary | ICD-10-CM | POA: Diagnosis not present

## 2021-06-26 DIAGNOSIS — N400 Enlarged prostate without lower urinary tract symptoms: Secondary | ICD-10-CM | POA: Diagnosis not present

## 2021-06-26 DIAGNOSIS — E876 Hypokalemia: Secondary | ICD-10-CM | POA: Diagnosis not present

## 2021-06-26 DIAGNOSIS — L03115 Cellulitis of right lower limb: Secondary | ICD-10-CM

## 2021-06-26 LAB — CBC WITH DIFFERENTIAL/PLATELET
Abs Immature Granulocytes: 0.03 10*3/uL (ref 0.00–0.07)
Basophils Absolute: 0.1 10*3/uL (ref 0.0–0.1)
Basophils Relative: 1 %
Eosinophils Absolute: 0.2 10*3/uL (ref 0.0–0.5)
Eosinophils Relative: 3 %
HCT: 46.1 % (ref 39.0–52.0)
Hemoglobin: 15 g/dL (ref 13.0–17.0)
Immature Granulocytes: 0 %
Lymphocytes Relative: 40 %
Lymphs Abs: 3.7 10*3/uL (ref 0.7–4.0)
MCH: 29.6 pg (ref 26.0–34.0)
MCHC: 32.5 g/dL (ref 30.0–36.0)
MCV: 91.1 fL (ref 80.0–100.0)
Monocytes Absolute: 0.8 10*3/uL (ref 0.1–1.0)
Monocytes Relative: 8 %
Neutro Abs: 4.5 10*3/uL (ref 1.7–7.7)
Neutrophils Relative %: 48 %
Platelets: 173 10*3/uL (ref 150–400)
RBC: 5.06 MIL/uL (ref 4.22–5.81)
RDW: 14.2 % (ref 11.5–15.5)
WBC: 9.2 10*3/uL (ref 4.0–10.5)
nRBC: 0 % (ref 0.0–0.2)

## 2021-06-26 LAB — COMPREHENSIVE METABOLIC PANEL
ALT: 43 U/L (ref 0–44)
AST: 31 U/L (ref 15–41)
Albumin: 4.1 g/dL (ref 3.5–5.0)
Alkaline Phosphatase: 99 U/L (ref 38–126)
Anion gap: 7 (ref 5–15)
BUN: 23 mg/dL (ref 8–23)
CO2: 24 mmol/L (ref 22–32)
Calcium: 9 mg/dL (ref 8.9–10.3)
Chloride: 105 mmol/L (ref 98–111)
Creatinine, Ser: 1.24 mg/dL (ref 0.61–1.24)
GFR, Estimated: >60 mL/min (ref >60)
Glucose, Bld: 74 mg/dL (ref 70–99)
Potassium: 3.9 mmol/L (ref 3.5–5.1)
Sodium: 136 mmol/L (ref 135–145)
Total Bilirubin: 0.9 mg/dL (ref 0.3–1.2)
Total Protein: 7.7 g/dL (ref 6.5–8.1)

## 2021-06-26 LAB — PSA: Prostatic Specific Antigen: 0.74 ng/mL (ref 0.00–4.00)

## 2021-06-26 MED ORDER — FUROSEMIDE 40 MG PO TABS: 40.0000 mg | ORAL_TABLET | Freq: Two times a day (BID) | ORAL | 3 refills | Status: DC

## 2021-06-26 MED ORDER — HYDROCODONE-ACETAMINOPHEN 5-325 MG PO TABS: 1.0000 | ORAL_TABLET | Freq: Two times a day (BID) | ORAL | 0 refills | Status: DC | PRN

## 2021-06-26 MED ORDER — EZETIMIBE 10 MG PO TABS: 10.0000 mg | ORAL_TABLET | Freq: Every day | ORAL | 3 refills | Status: DC

## 2021-06-26 MED ORDER — MUPIROCIN 2 % EX OINT: 1.0000 "application " | TOPICAL_OINTMENT | Freq: Two times a day (BID) | CUTANEOUS | 0 refills | Status: DC

## 2021-06-26 MED ORDER — POTASSIUM CHLORIDE ER 10 MEQ PO TBCR: 10.0000 meq | EXTENDED_RELEASE_TABLET | Freq: Every day | ORAL | 3 refills | Status: DC

## 2021-06-26 MED ORDER — SULFAMETHOXAZOLE-TRIMETHOPRIM 800-160 MG PO TABS: 1.0000 | ORAL_TABLET | Freq: Two times a day (BID) | ORAL | 0 refills | Status: DC

## 2021-06-26 MED ORDER — ALPRAZOLAM 0.5 MG PO TABS: 0.5000 mg | ORAL_TABLET | Freq: Three times a day (TID) | ORAL | 5 refills | Status: DC | PRN

## 2021-06-26 MED ORDER — CLOPIDOGREL BISULFATE 75 MG PO TABS: 75.0000 mg | ORAL_TABLET | Freq: Every day | ORAL | 3 refills | Status: DC

## 2021-06-26 NOTE — Progress Notes (Signed)
Chief Complaint  Patient presents with   Follow-up   Leg Problem    Right leg swelling since this past Saturday right above the knee. Red and painful with a yellow scab in the center. Patient reports no drainage. Has had similar bumps on the face while in the hospital and was given antibiotics. Patient is currently still taking oral antibiotics.    F/u  1. Recurrent boils to skin left chin last week and right cheek and now right lower thigh hot red warm and leg is swollen and painful. He has been to ed in [place] 2x this month and also in months ago had foreign body in left foot was supposed to get MRI left foot per podiatry but this did not happen due has defibrillator and did not get MRI cc'ed podiatry  He is requesting med for pain 7/10 today  2. Needs chronic refills on other medications   Review of Systems  Constitutional:  Negative for weight loss.  HENT:  Negative for hearing loss.   Eyes:  Negative for blurred vision.  Respiratory:  Negative for shortness of breath.   Cardiovascular:  Negative for chest pain.  Gastrointestinal:  Negative for abdominal pain and blood in stool.  Musculoskeletal:  Negative for back pain.  Skin:  Positive for rash.  Neurological:  Negative for headaches.  Psychiatric/Behavioral:  Negative for depression.   Past Medical History:  Diagnosis Date   Coronary artery disease    a. 3 stents to the LAD, 1 to the PDA, and 1 to the OM3; b. cath 06/2013: chronically occluded LAD, patent stent LAD, D1 20%, pLCx 30%, mLCx 20%, pRCA 70% (FFR 0.74) s/p PCI/DES, mRCA 70% s/p PCI/DES, RPDA 50%, EF 20% w/ AK of anterolateral & apical walls, moderately elevated LVEDP   Family history of lung cancer    HFrEF (heart failure with reduced ejection fraction) ([place])    a. 05/2014 Echo: EF 25-30%; b. 01/2018 Echo ([facility]): EF 30%.   HLD (hyperlipidemia)    HTN (hypertension)    ICD  single,BSX    a. DOI 11/2011; b. S/N# [id number]   Ischemic cardiomyopathy    a. 05/2014 Echo:  EF 25-30%; b. 01/2018 Echo ([facility]): EF 30%, sev glob HK w/ apical/ant AK. Mild LVH. Mod LAE, mild RAE. Triv MR/TR.   Personal history of colonic polyps    Pneumonia    08/2018   Syncope and collapse    Ventricular tachycardia 11/2011   a. s/p AICD implant    Past Surgical History:  Procedure Laterality Date   appendectomy     CARDIAC CATHETERIZATION  Nov 30 2011   [facility]   CARDIAC DEFIBRILLATOR PLACEMENT  May 2013   COLONOSCOPY WITH PROPOFOL N/A 12/22/2020   Procedure: COLONOSCOPY WITH PROPOFOL;  Surgeon: Jonathon Bellows, MD;  Location: [facility] ENDOSCOPY;  Service: Gastroenterology;  Laterality: N/A;   CORONARY ANGIOPLASTY  2014   x 2 stents   CORONARY STENT PLACEMENT     x7   LEFT HEART CATH AND CORONARY ANGIOGRAPHY Left 08/30/2019   Procedure: LEFT HEART CATH AND CORONARY ANGIOGRAPHY;  Surgeon: Wellington Hampshire, MD;  Location: [facility];  Service: Cardiovascular;  Laterality: Left;   PROSTATE SURGERY     urethra grew around prostate    TUMOR REMOVAL  2001   chest thymic cyst; behind lungs [facility] benign    Family History  Problem Relation Age of Onset   Cancer - Lung Mother    Hypertension Mother    Heart  attack Mother    Cancer Father        larynx   Dementia Maternal Grandmother    Heart attack Paternal Grandfather    Colon cancer Neg Hx    Stomach cancer Neg Hx    Pancreatic cancer Neg Hx    Social History   Socioeconomic History   Marital status: Married    Spouse name: Hilda Blades    Number of children: 2   Years of education: Not on file   Highest education level: Not on file  Occupational History   Occupation: retired   Tobacco Use   Smoking status: Former    Packs/day: 0.50    Years: 42.00    Pack years: 21.00    Types: Cigarettes    Quit date: 11/10/2019    Years since quitting: 1.6   Smokeless tobacco: Never  Vaping Use   Vaping Use: Never used  Substance and Sexual Activity   Alcohol use: Yes    Alcohol/week: 8.0 standard drinks    Types: 8 Cans of beer  per week    Comment: per week   Drug use: No   Sexual activity: Not on file  Other Topics Concern   Not on file  Social History Narrative   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    Lives in [place]    2 kids daughters    Gets regular exercise   Smoker   [employer]    Social Determinants of Health   Financial Resource Strain: Medium Risk   Difficulty of Paying Living Expenses: Somewhat hard  Food Insecurity: No Food Insecurity   Worried About Charity fundraiser in the Last Year: Never true   Arboriculturist in the Last Year: Never true  Transportation Needs: No Transportation Needs   Lack of Transportation (Medical): No   Lack of Transportation (Non-Medical): No  Physical Activity: Insufficiently Active   Days of Exercise per Week: 4 days   Minutes of Exercise per Session: 20 min  Stress: No Stress Concern Present   Feeling of Stress : Not at all  Social Connections: Unknown   Frequency of Communication with Friends and Family: More than three times a week   Frequency of Social Gatherings with Friends and Family: Not on file   Attends Religious Services: Not on file   Active Member of Clubs or Organizations: Not on file   Attends Archivist Meetings: Not on file   Marital Status: Not on file  Intimate Partner Violence: Not At Risk   Fear of Current or Ex-Partner: No   Emotionally Abused: No   Physically Abused: No   Sexually Abused: No   Current Meds  Medication Sig   albuterol (VENTOLIN HFA) 108 (90 Base) MCG/ACT inhaler Inhale 1 puff into the lungs every 6 (six) hours as needed for wheezing or shortness of breath.   amiodarone (PACERONE) 200 MG tablet Take 1 tablet (200 mg total) by mouth daily.   aspirin 81 MG EC tablet Take 1 tablet (81 mg total) by mouth daily.   atorvastatin (LIPITOR) 80 MG tablet Take 1 tablet (80 mg total) by mouth daily.   carvedilol (COREG) 3.125 MG tablet Take  1 tablet (3.125 mg total) by mouth 2 (two) times daily.   clindamycin (CLEOCIN) 300 MG capsule Take 300 mg by mouth 4 (four) times daily.   HYDROcodone-acetaminophen (NORCO) 5-325 MG tablet Take  1 tablet by mouth 2 (two) times daily as needed for moderate pain.   ipratropium-albuterol (DUONEB) 0.5-2.5 (3) MG/3ML SOLN Take 3 mLs by nebulization every 6 (six) hours as needed.   mupirocin ointment (BACTROBAN) 2 % Apply 1 application topically 2 (two) times daily.   sacubitril-valsartan (ENTRESTO) 97-103 MG Take 1 tablet by mouth 2 (two) times daily.   sulfamethoxazole-trimethoprim (BACTRIM DS) 800-160 MG tablet Take 1 tablet by mouth 2 (two) times daily. With food   tadalafil (CIALIS) 10 MG tablet Take 1-2 tablets (10-20 mg total) by mouth daily as needed for erectile dysfunction.   Tiotropium Bromide-Olodaterol (STIOLTO RESPIMAT) 2.5-2.5 MCG/ACT AERS Inhale 2 puffs into the lungs daily.   [DISCONTINUED] ALPRAZolam (XANAX) 0.5 MG tablet Take 1 tablet (0.5 mg total) by mouth 3 (three) times daily as needed. for anxiety   [DISCONTINUED] clopidogrel (PLAVIX) 75 MG tablet Take 1 tablet by mouth once daily   [DISCONTINUED] ezetimibe (ZETIA) 10 MG tablet Take 1 tablet (10 mg total) by mouth daily.   [DISCONTINUED] furosemide (LASIX) 40 MG tablet Take 1 tablet by mouth twice daily (Patient taking differently: Take 80 mg by mouth daily.)   [DISCONTINUED] potassium chloride (KLOR-CON 10) 10 MEQ tablet Take 1 tablet (10 mEq total) by mouth daily.   Allergies  Allergen Reactions   Nitroglycerin Nausea Only and Other (See Comments)    Per patient "causes severe  headache"   Recent Results (from the past 2160 hour(s))  Comprehensive metabolic panel     Status: Abnormal   Collection Time: 04/06/21  2:07 PM  Result Value Ref Range   Glucose 117 (H) 65 - 99 mg/dL   BUN 20 8 - 27 mg/dL   Creatinine, Ser 1.38 (H) 0.76 - 1.27 mg/dL   eGFR 56 (L) >59 mL/min/1.73   BUN/Creatinine Ratio 14 10 - 24   Sodium 139  134 - 144 mmol/L   Potassium 4.9 3.5 - 5.2 mmol/L   Chloride 104 96 - 106 mmol/L   CO2 18 (L) 20 - 29 mmol/L   Calcium 8.9 8.6 - 10.2 mg/dL   Total Protein 6.1 6.0 - 8.5 g/dL   Albumin 3.8 3.8 - 4.8 g/dL   Globulin, Total 2.3 1.5 - 4.5 g/dL   Albumin/Globulin Ratio 1.7 1.2 - 2.2   Bilirubin Total 0.4 0.0 - 1.2 mg/dL   Alkaline Phosphatase 100 44 - 121 IU/L   AST 33 0 - 40 IU/L   ALT 33 0 - 44 IU/L  TSH     Status: None   Collection Time: 04/06/21  2:07 PM  Result Value Ref Range   TSH 2.660 0.450 - 4.500 uIU/mL  Magnesium     Status: None   Collection Time: 04/18/21 10:38 AM  Result Value Ref Range   Magnesium 2.1 1.5 - 2.5 mg/dL  Lipid panel     Status: None   Collection Time: 04/18/21 10:38 AM  Result Value Ref Range   Cholesterol 116 0 - 200 mg/dL    Comment: ATP III Classification       Desirable:  < 200 mg/dL               Borderline High:  200 - 239 mg/dL          High:  > = 240 mg/dL   Triglycerides 93.0 0.0 - 149.0 mg/dL    Comment: Normal:  <150 mg/dLBorderline High:  150 - 199 mg/dL   HDL 45.40 >39.00 mg/dL   VLDL 18.6 0.0 - 40.0  mg/dL   LDL Cholesterol 52 0 - 99 mg/dL   Total CHOL/HDL Ratio 3     Comment:                Men          Women1/2 Average Risk     3.4          3.3Average Risk          5.0          4.42X Average Risk          9.6          7.13X Average Risk          15.0          11.0                       NonHDL 70.96     Comment: NOTE:  Non-HDL goal should be 30 mg/dL higher than patient's LDL goal (i.e. LDL goal of < 70 mg/dL, would have non-HDL goal of < 100 mg/dL)  Hemoglobin A1c     Status: None   Collection Time: 04/18/21 10:38 AM  Result Value Ref Range   Hgb A1c MFr Bld 5.6 4.6 - 6.5 %    Comment: Glycemic Control Guidelines for People with Diabetes:Non Diabetic:  <6%Goal of Therapy: <7%Additional Action Suggested:  >8%   CBC with Differential/Platelet     Status: Abnormal   Collection Time: 04/18/21 10:38 AM  Result Value Ref Range   WBC 6.6  4.0 - 10.5 K/uL   RBC 5.33 4.22 - 5.81 Mil/uL   Hemoglobin 16.0 13.0 - 17.0 g/dL   HCT 47.9 39.0 - 52.0 %   MCV 89.9 78.0 - 100.0 fl   MCHC 33.3 30.0 - 36.0 g/dL   RDW 15.1 11.5 - 15.5 %   Platelets 162.0 150.0 - 400.0 K/uL   Neutrophils Relative % 41.5 (L) 43.0 - 77.0 %   Lymphocytes Relative 46.9 (H) 12.0 - 46.0 %   Monocytes Relative 9.3 3.0 - 12.0 %   Eosinophils Relative 1.5 0.0 - 5.0 %   Basophils Relative 0.8 0.0 - 3.0 %   Neutro Abs 2.7 1.4 - 7.7 K/uL   Lymphs Abs 3.1 0.7 - 4.0 K/uL   Monocytes Absolute 0.6 0.1 - 1.0 K/uL   Eosinophils Absolute 0.1 0.0 - 0.7 K/uL   Basophils Absolute 0.1 0.0 - 0.1 K/uL   Objective  Body mass index is 29.74 kg/m. Wt Readings from Last 3 Encounters:  06/26/21 195 lb 9.6 oz (88.7 kg)  05/02/21 198 lb 2 oz (89.9 kg)  04/16/21 203 lb (92.1 kg)   Temp Readings from Last 3 Encounters:  06/26/21 (!) 97.2 F (36.2 C) (Temporal)  02/14/21 97.9 F (36.6 C) (Oral)  12/22/20 (!) 96.2 F (35.7 C) (Temporal)   BP Readings from Last 3 Encounters:  06/26/21 134/80  05/02/21 118/60  03/13/21 140/80   Pulse Readings from Last 3 Encounters:  06/26/21 (!) 57  05/02/21 64  03/13/21 (!) 55    Physical Exam Vitals and nursing note reviewed.  Constitutional:      Appearance: Normal appearance. He is well-developed and well-groomed. He is obese.  HENT:     Head: Normocephalic and atraumatic.  Eyes:     Conjunctiva/sclera: Conjunctivae normal.     Pupils: Pupils are equal, round, and reactive to light.  Cardiovascular:     Rate and Rhythm: Normal rate and regular rhythm.  Heart sounds: Normal heart sounds.  Pulmonary:     Effort: Pulmonary effort is normal. No respiratory distress.     Breath sounds: Normal breath sounds.  Abdominal:     Tenderness: There is no abdominal tenderness.  Musculoskeletal:     Lumbar back: Tenderness present. Negative right straight leg raise test and negative left straight leg raise test.  Skin:     General: Skin is warm and moist.  Neurological:     General: No focal deficit present.     Mental Status: He is alert and oriented to person, place, and time. Mental status is at baseline.     Sensory: Sensation is intact.     Motor: Motor function is intact.     Coordination: Coordination is intact.     Gait: Gait is intact. Gait normal.  Psychiatric:        Attention and Perception: Attention and perception normal.        Mood and Affect: Mood and affect normal.        Speech: Speech normal.        Behavior: Behavior normal. Behavior is cooperative.        Thought Content: Thought content normal.        Cognition and Memory: Cognition and memory normal.        Judgment: Judgment normal.    Assessment  Plan  Open wound  right lower thigh, right cheek, resolving left chin- Plan: mupirocin ointment (BACTROBAN) 2 %, sulfamethoxazole-trimethoprim (BACTRIM DS) 800-160 MG tablet bid x 10 days, Comprehensive metabolic panel, CBC with Differential/Platelet, Fungus culture, blood, Culture, blood (routine x 2), HYDROcodone-acetaminophen (NORCO) 5-325 MG tablet  Cellulitis of right lower extremity - Plan: sulfamethoxazole-trimethoprim (BACTRIM DS) 800-160 MG tablet, Comprehensive metabolic panel, CBC with Differential/Platelet, Culture, blood (routine x 2), HYDROcodone-acetaminophen (NORCO) 5-325 MG tablet  S/p foreign body left foot  Cc'ed Dr. Sherryle Lis podiatry to see if needs CT scan instead of MRI as pt did not do MRI  Anxiety - Plan: ALPRAZolam (XANAX) 0.5 MG tablet  Coronary artery disease of native artery of native heart with stable angina pectoris ([place]) - Plan: clopidogrel (PLAVIX) 75 MG tablet F/u cards  Hyperlipidemia, unspecified hyperlipidemia type - Plan: ezetimibe (ZETIA) 10 MG tablet  Hypokalemia - Plan: potassium chloride (KLOR-CON 10) 10 MEQ tablet   HM Wants to do flu and prevnar  13 at pharmacy and declines for now due to acute issues  Provider: Dr. Olivia Mackie  [facility]

## 2021-06-26 NOTE — Patient Instructions (Addendum)
Wash body in antibacterial soap Dove antibacterial body wash, chlorhexadine or Hibiclens   Cellulitis, Adult Cellulitis is a skin infection. The infected area is usually warm, red, swollen, and tender. This condition occurs most often in the arms and lower legs. The infection can travel to the muscles, blood, and underlying tissue and become serious. It is very important to get treated for this condition. What are the causes? Cellulitis is caused by bacteria. The bacteria enter through a break in the skin, such as a cut, burn, insect bite, open sore, or crack. What increases the risk? This condition is more likely to occur in people who: Have a weak body defense system (immune system). Have open wounds on the skin, such as cuts, burns, bites, and scrapes. Bacteria can enter the body through these open wounds. Are older than 67 years of age. Have diabetes. Have a type of long-lasting (chronic) liver disease (cirrhosis) or kidney disease. Are obese. Have a skin condition such as: Itchy rash (eczema). Slow movement of blood in the veins (venous stasis). Fluid buildup below the skin (edema). Have had radiation therapy. Use IV drugs. What are the signs or symptoms? Symptoms of this condition include: Redness, streaking, or spotting on the skin. Swollen area of the skin. Tenderness or pain when an area of the skin is touched. Warm skin. A fever. Chills. Blisters. How is this diagnosed? This condition is diagnosed based on a medical history and physical exam. You may also have tests, including: Blood tests. Imaging tests. How is this treated? Treatment for this condition may include: Medicines, such as antibiotic medicines or medicines to treat allergies (antihistamines). Supportive care, such as rest and application of cold or warm cloths (compresses) to the skin. Hospital care, if the condition is severe. The infection usually starts to get better within 1-2 days of  treatment. Follow these instructions at home: Medicines Take over-the-counter and prescription medicines only as told by your health care provider. If you were prescribed an antibiotic medicine, take it as told by your health care provider. Do not stop taking the antibiotic even if you start to feel better. General instructions Drink enough fluid to keep your urine pale yellow. Do not touch or rub the infected area. Raise (elevate) the infected area above the level of your heart while you are sitting or lying down. Apply warm or cold compresses to the affected area as told by your health care provider. Keep all follow-up visits as told by your health care provider. This is important. These visits let your health care provider make sure a more serious infection is not developing. Contact a health care provider if: You have a fever. Your symptoms do not begin to improve within 1-2 days of starting treatment. Your bone or joint underneath the infected area becomes painful after the skin has healed. Your infection returns in the same area or another area. You notice a swollen bump in the infected area. You develop new symptoms. You have a general ill feeling (malaise) with muscle aches and pains. Get help right away if: Your symptoms get worse. You feel very sleepy. You develop vomiting or diarrhea that persists. You notice red streaks coming from the infected area. Your red area gets larger or turns dark in color. These symptoms may represent a serious problem that is an emergency. Do not wait to see if the symptoms will go away. Get medical help right away. Call your local emergency services (911 in the [place] Do not drive yourself to  the hospital. Summary Cellulitis is a skin infection. This condition occurs most often in the arms and lower legs. Treatment for this condition may include medicines, such as antibiotic medicines or antihistamines. Take over-the-counter and prescription  medicines only as told by your health care provider. If you were prescribed an antibiotic medicine, do not stop taking the antibiotic even if you start to feel better. Contact a health care provider if your symptoms do not begin to improve within 1-2 days of starting treatment or your symptoms get worse. Keep all follow-up visits as told by your health care provider. This is important. These visits let your health care provider make sure that a more serious infection is not developing. This information is not intended to replace advice given to you by your health care provider. Make sure you discuss any questions you have with your health care provider. Document Revised: 07/26/2019 Document Reviewed: 12/04/2017 Elsevier Patient Education  [place].

## 2021-06-27 ENCOUNTER — Telehealth: Payer: Self-pay | Admitting: Internal Medicine

## 2021-06-27 NOTE — Telephone Encounter (Signed)
Pt was seen in office on 11/29 at 3:30 with Dr. Olivia Mackie (PCP). Pt was prescribed an antibiotic originally before being seen clindamycin (CLEOCIN) 300 MG capsule  Pt was prescribed sulfamethoxazole-trimethoprim (BACTRIM DS) 800-160 MG tablet  Pt is wanting to know if its okay to be taking both of these medications at the same time. Or if he was supposed to stop taking the clindamycin?

## 2021-06-28 NOTE — Telephone Encounter (Signed)
Please advise 

## 2021-06-28 NOTE — Telephone Encounter (Signed)
Please only take bactrim thanks for letting me know about clindamycin

## 2021-06-29 ENCOUNTER — Other Ambulatory Visit: Payer: Self-pay

## 2021-06-29 MED ORDER — ENTRESTO 97-103 MG PO TABS: 1.0000 | ORAL_TABLET | Freq: Two times a day (BID) | ORAL | 0 refills | Status: DC

## 2021-06-29 NOTE — Telephone Encounter (Signed)
I called and advised patient to just take the bactrim.

## 2021-07-01 LAB — CULTURE, BLOOD (ROUTINE X 2): Culture: NO GROWTH

## 2021-07-02 ENCOUNTER — Telehealth: Payer: Self-pay | Admitting: Internal Medicine

## 2021-07-02 ENCOUNTER — Telehealth: Payer: Self-pay

## 2021-07-02 NOTE — Telephone Encounter (Signed)
Called the patient to get him schedule, the next 2 weeks are not good for him , he is coming from [place].   He will call back in the next couple days to get scheduled.

## 2021-07-02 NOTE — Telephone Encounter (Signed)
OK. I'm sure that [facility] or [facility] could see him in [place] too if he needs it sooner

## 2021-07-02 NOTE — Telephone Encounter (Signed)
-----   Message from Sheppard Evens sent at 07/02/2021  4:46 PM EST -----  ----- Message ----- From: Criselda Peaches, DPM Sent: 06/27/2021   2:26 PM EST To: Sheppard Evens  Will have to be next week then. Mon/Tues in [place] or I can do Wed in [place]. Thanks for calling him! ----- Message ----- From: Sheppard Evens Sent: 06/27/2021   2:15 PM EST To: Criselda Peaches, DPM  Pt stated that he can't couldn't come today in [place] today or tomorrow in [place], what do you advise  ----- Message ----- From: Criselda Peaches, DPM Sent: 06/26/2021   5:11 PM EST To: Sheppard Evens, Nino Glow McLean-Scocuzza, MD  I'm going to have my office schedule him for a visit this week or next to re-evaluate him, this seems odd. He originally had a CT at [facility] when this happened which did not show any FB. Will re assess once I can see what sort of wounds these are. May need a biopsy.  Estill Bamberg can you schedule him to see me Wed 11/30 or next week in [place]? I can see in [place] office too if he can make it there. Thanks ----- Message ----- From: McLean-Scocuzza, Nino Glow, MD Sent: 06/26/2021   4:20 PM EST To: Criselda Peaches, DPM  Pt was not able to do MRI due to debrillator and now having wounds pop up all over body face and trunk  Can you order re imaging maybe CT scan to look for foreign body?

## 2021-07-02 NOTE — Telephone Encounter (Signed)
Patient requesting lab results from last week.

## 2021-07-03 ENCOUNTER — Telehealth: Payer: Self-pay | Admitting: Internal Medicine

## 2021-07-03 NOTE — Telephone Encounter (Signed)
Patient called for lab results.

## 2021-07-03 NOTE — Telephone Encounter (Signed)
See new telephone encounter from 07/03/21. Left message to return call.

## 2021-07-03 NOTE — Telephone Encounter (Signed)
Patient called in requesting results  from lab work done on 06/26/21

## 2021-07-03 NOTE — Telephone Encounter (Signed)
Ian Glow McLean-Scocuzza, MD  07/02/2021 10:13 AM EST Back to Top    So blood cultures no growth after 5 days Psa normal  Liver kidneys normal  Blood cts normal  Fungus culture pending

## 2021-07-03 NOTE — Telephone Encounter (Signed)
Pt called in regards to labs

## 2021-07-03 NOTE — Telephone Encounter (Signed)
Left message to return call 

## 2021-07-04 ENCOUNTER — Ambulatory Visit: Payer: Medicare Other | Admitting: Urology

## 2021-07-04 ENCOUNTER — Telehealth: Payer: Self-pay | Admitting: Pharmacy Technician

## 2021-07-04 DIAGNOSIS — Z596 Low income: Secondary | ICD-10-CM

## 2021-07-04 NOTE — Progress Notes (Signed)
[facility])                                            [place] Team    07/04/2021  TZVI ECONOMOU 12-28-1955 [id number]  Received both patient and provider portion(s) of patient assistance application(s) for [employer]. Faxed completed application and required documents into [employer] and [employer] respectively.    Ingram Onnen P. Mashonda Broski, [place]  [phone]

## 2021-07-04 NOTE — Telephone Encounter (Signed)
Patient informed and verbalized understanding

## 2021-07-04 NOTE — Telephone Encounter (Signed)
Christian Mate J 17 hours ago (5:01 PM)   LW Patient called in requesting results  from lab work done on 06/26/21

## 2021-07-04 NOTE — Telephone Encounter (Signed)
Added note to previous telephone encounter

## 2021-07-12 ENCOUNTER — Other Ambulatory Visit: Payer: Self-pay

## 2021-07-12 ENCOUNTER — Ambulatory Visit: Payer: Medicare Other | Admitting: Urology

## 2021-07-12 ENCOUNTER — Ambulatory Visit (INDEPENDENT_AMBULATORY_CARE_PROVIDER_SITE_OTHER): Payer: Medicare Other

## 2021-07-12 ENCOUNTER — Other Ambulatory Visit: Payer: Medicare Other

## 2021-07-12 DIAGNOSIS — I255 Ischemic cardiomyopathy: Secondary | ICD-10-CM | POA: Diagnosis not present

## 2021-07-12 DIAGNOSIS — I25118 Atherosclerotic heart disease of native coronary artery with other forms of angina pectoris: Secondary | ICD-10-CM | POA: Diagnosis not present

## 2021-07-12 LAB — ECHOCARDIOGRAM COMPLETE
AR max vel: 3.22 cm2
AV Area VTI: 3.22 cm2
AV Area mean vel: 3.17 cm2
AV Mean grad: 3 mmHg
AV Peak grad: 5.3 mmHg
Ao pk vel: 1.15 m/s
Area-P 1/2: 2.09 cm2

## 2021-07-19 ENCOUNTER — Ambulatory Visit: Payer: Medicare Other | Admitting: Urology

## 2021-07-24 ENCOUNTER — Telehealth: Payer: Self-pay

## 2021-07-24 DIAGNOSIS — Z79899 Other long term (current) drug therapy: Secondary | ICD-10-CM

## 2021-07-24 MED ORDER — SPIRONOLACTONE 25 MG PO TABS: 25.0000 mg | ORAL_TABLET | Freq: Every day | ORAL | 3 refills | Status: DC

## 2021-07-24 NOTE — Telephone Encounter (Signed)
Able to reach pt regarding his recent ECHO Dr. Rockey Situ had a chance to review his results and advised   "Echocardiogram  Ejection fraction still remains low 25 to 30%, relatively unchanged from prior study   We should consider addition of spironolactone 25 mg daily in effort to increase heart pump function   Additional medications we should think about include Farxiga/Jardiance but would try spironolactone first, then after 1 to 2 months would consider adding Lovie Macadamia   If agreeable to spironolactone 25 mg daily, needs BMP after 3  weeks "  Mr. Yadav will pick up newly order medication of spironolactone 25 mg daily at [facility], would like BMP order for [place] in about 3 weeks time, otherwise all questions or concerns were address and no additional concerns at this time. Agreeable to plan, will call back for anything further.  Appt with Caryl Comes in Feb and Brady in March.

## 2021-07-25 NOTE — Telephone Encounter (Signed)
Patient calling  Has questions in regards to the new medication  Please call to discuss

## 2021-07-25 NOTE — Telephone Encounter (Signed)
Was able to return call to Mr. Weigelt regarding medication concerns. Was able to review benefits of spironolactone, risk of long term use with Entresto d/t high K+ levels, reminded pt of labs in 3 weeks of starting spironolactone to monitor K+ levels. Pt verbalized understanding.  Also educated pt on Towner as he may be switch to one of these medication in the future, possibly at next OV. Both beneficial for CHF, but advised to check with insurance, may need PA similar to [facility] to help cover cost.   Mr. Bargar thankful for the return call and education on meds, he will call back for anything further.

## 2021-07-29 ENCOUNTER — Inpatient Hospital Stay
Admission: EM | Admit: 2021-07-29 | Discharge: 2021-08-07 | DRG: 871 | Disposition: A | Discharge status: Home / Self Care | Payer: Medicare Other | Attending: Internal Medicine | Admitting: Internal Medicine

## 2021-07-29 ENCOUNTER — Emergency Department: Payer: Medicare Other

## 2021-07-29 ENCOUNTER — Other Ambulatory Visit: Payer: Self-pay

## 2021-07-29 ENCOUNTER — Inpatient Hospital Stay: Payer: Medicare Other

## 2021-07-29 ENCOUNTER — Encounter: Payer: Self-pay | Admitting: Emergency Medicine

## 2021-07-29 DIAGNOSIS — N189 Chronic kidney disease, unspecified: Secondary | ICD-10-CM

## 2021-07-29 DIAGNOSIS — I472 Ventricular tachycardia, unspecified: Secondary | ICD-10-CM | POA: Diagnosis present

## 2021-07-29 DIAGNOSIS — Z7982 Long term (current) use of aspirin: Secondary | ICD-10-CM

## 2021-07-29 DIAGNOSIS — A4189 Other specified sepsis: Principal | ICD-10-CM | POA: Diagnosis present

## 2021-07-29 DIAGNOSIS — I5043 Acute on chronic combined systolic (congestive) and diastolic (congestive) heart failure: Secondary | ICD-10-CM

## 2021-07-29 DIAGNOSIS — E872 Acidosis, unspecified: Secondary | ICD-10-CM | POA: Diagnosis present

## 2021-07-29 DIAGNOSIS — R7881 Bacteremia: Secondary | ICD-10-CM | POA: Diagnosis not present

## 2021-07-29 DIAGNOSIS — I5023 Acute on chronic systolic (congestive) heart failure: Secondary | ICD-10-CM | POA: Diagnosis present

## 2021-07-29 DIAGNOSIS — L03114 Cellulitis of left upper limb: Secondary | ICD-10-CM | POA: Diagnosis not present

## 2021-07-29 DIAGNOSIS — I2510 Atherosclerotic heart disease of native coronary artery without angina pectoris: Secondary | ICD-10-CM | POA: Diagnosis present

## 2021-07-29 DIAGNOSIS — E328 Other diseases of thymus: Secondary | ICD-10-CM | POA: Diagnosis present

## 2021-07-29 DIAGNOSIS — E871 Hypo-osmolality and hyponatremia: Secondary | ICD-10-CM | POA: Diagnosis not present

## 2021-07-29 DIAGNOSIS — M7989 Other specified soft tissue disorders: Secondary | ICD-10-CM | POA: Diagnosis present

## 2021-07-29 DIAGNOSIS — A419 Sepsis, unspecified organism: Secondary | ICD-10-CM | POA: Diagnosis not present

## 2021-07-29 DIAGNOSIS — F419 Anxiety disorder, unspecified: Secondary | ICD-10-CM | POA: Diagnosis present

## 2021-07-29 DIAGNOSIS — A400 Sepsis due to streptococcus, group A: Secondary | ICD-10-CM

## 2021-07-29 DIAGNOSIS — Z8249 Family history of ischemic heart disease and other diseases of the circulatory system: Secondary | ICD-10-CM

## 2021-07-29 DIAGNOSIS — D6959 Other secondary thrombocytopenia: Secondary | ICD-10-CM | POA: Diagnosis present

## 2021-07-29 DIAGNOSIS — Z95810 Presence of automatic (implantable) cardiac defibrillator: Secondary | ICD-10-CM

## 2021-07-29 DIAGNOSIS — I5022 Chronic systolic (congestive) heart failure: Secondary | ICD-10-CM | POA: Diagnosis not present

## 2021-07-29 DIAGNOSIS — D696 Thrombocytopenia, unspecified: Secondary | ICD-10-CM | POA: Diagnosis not present

## 2021-07-29 DIAGNOSIS — R6521 Severe sepsis with septic shock: Secondary | ICD-10-CM | POA: Diagnosis not present

## 2021-07-29 DIAGNOSIS — I130 Hypertensive heart and chronic kidney disease with heart failure and stage 1 through stage 4 chronic kidney disease, or unspecified chronic kidney disease: Secondary | ICD-10-CM | POA: Diagnosis present

## 2021-07-29 DIAGNOSIS — Z87891 Personal history of nicotine dependence: Secondary | ICD-10-CM

## 2021-07-29 DIAGNOSIS — I959 Hypotension, unspecified: Secondary | ICD-10-CM | POA: Diagnosis not present

## 2021-07-29 DIAGNOSIS — Z801 Family history of malignant neoplasm of trachea, bronchus and lung: Secondary | ICD-10-CM | POA: Diagnosis not present

## 2021-07-29 DIAGNOSIS — Z79899 Other long term (current) drug therapy: Secondary | ICD-10-CM

## 2021-07-29 DIAGNOSIS — R7401 Elevation of levels of liver transaminase levels: Secondary | ICD-10-CM | POA: Diagnosis not present

## 2021-07-29 DIAGNOSIS — Z20822 Contact with and (suspected) exposure to covid-19: Secondary | ICD-10-CM | POA: Diagnosis present

## 2021-07-29 DIAGNOSIS — I10 Essential (primary) hypertension: Secondary | ICD-10-CM | POA: Diagnosis not present

## 2021-07-29 DIAGNOSIS — N1831 Chronic kidney disease, stage 3a: Secondary | ICD-10-CM | POA: Diagnosis present

## 2021-07-29 DIAGNOSIS — R748 Abnormal levels of other serum enzymes: Secondary | ICD-10-CM | POA: Diagnosis not present

## 2021-07-29 DIAGNOSIS — B955 Unspecified streptococcus as the cause of diseases classified elsewhere: Secondary | ICD-10-CM | POA: Diagnosis not present

## 2021-07-29 DIAGNOSIS — M545 Low back pain, unspecified: Secondary | ICD-10-CM | POA: Diagnosis not present

## 2021-07-29 DIAGNOSIS — R600 Localized edema: Secondary | ICD-10-CM | POA: Diagnosis not present

## 2021-07-29 DIAGNOSIS — Z9049 Acquired absence of other specified parts of digestive tract: Secondary | ICD-10-CM

## 2021-07-29 DIAGNOSIS — Z888 Allergy status to other drugs, medicaments and biological substances status: Secondary | ICD-10-CM

## 2021-07-29 DIAGNOSIS — I255 Ischemic cardiomyopathy: Secondary | ICD-10-CM | POA: Diagnosis present

## 2021-07-29 DIAGNOSIS — Z955 Presence of coronary angioplasty implant and graft: Secondary | ICD-10-CM | POA: Diagnosis not present

## 2021-07-29 DIAGNOSIS — Z7902 Long term (current) use of antithrombotics/antiplatelets: Secondary | ICD-10-CM

## 2021-07-29 DIAGNOSIS — Z8719 Personal history of other diseases of the digestive system: Secondary | ICD-10-CM | POA: Diagnosis not present

## 2021-07-29 DIAGNOSIS — R079 Chest pain, unspecified: Secondary | ICD-10-CM | POA: Diagnosis not present

## 2021-07-29 DIAGNOSIS — A491 Streptococcal infection, unspecified site: Secondary | ICD-10-CM | POA: Diagnosis not present

## 2021-07-29 DIAGNOSIS — N179 Acute kidney failure, unspecified: Secondary | ICD-10-CM | POA: Diagnosis not present

## 2021-07-29 DIAGNOSIS — J449 Chronic obstructive pulmonary disease, unspecified: Secondary | ICD-10-CM | POA: Diagnosis not present

## 2021-07-29 DIAGNOSIS — R55 Syncope and collapse: Secondary | ICD-10-CM | POA: Diagnosis present

## 2021-07-29 DIAGNOSIS — E785 Hyperlipidemia, unspecified: Secondary | ICD-10-CM | POA: Diagnosis present

## 2021-07-29 DIAGNOSIS — R42 Dizziness and giddiness: Secondary | ICD-10-CM | POA: Diagnosis not present

## 2021-07-29 HISTORY — DX: Sepsis, unspecified organism: A41.9

## 2021-07-29 HISTORY — DX: Sepsis due to Streptococcus, group A: A40.0

## 2021-07-29 LAB — CBC WITH DIFFERENTIAL/PLATELET
Abs Immature Granulocytes: 0.25 10*3/uL — ABNORMAL HIGH (ref 0.00–0.07)
Basophils Absolute: 0.1 10*3/uL (ref 0.0–0.1)
Basophils Relative: 0 %
Eosinophils Absolute: 0 10*3/uL (ref 0.0–0.5)
Eosinophils Relative: 0 %
HCT: 46.9 % (ref 39.0–52.0)
Hemoglobin: 15.5 g/dL (ref 13.0–17.0)
Immature Granulocytes: 1 %
Lymphocytes Relative: 3 %
Lymphs Abs: 0.5 10*3/uL — ABNORMAL LOW (ref 0.7–4.0)
MCH: 29.9 pg (ref 26.0–34.0)
MCHC: 33 g/dL (ref 30.0–36.0)
MCV: 90.4 fL (ref 80.0–100.0)
Monocytes Absolute: 0.4 10*3/uL (ref 0.1–1.0)
Monocytes Relative: 2 %
Neutro Abs: 16.2 10*3/uL — ABNORMAL HIGH (ref 1.7–7.7)
Neutrophils Relative %: 94 %
Platelets: 142 10*3/uL — ABNORMAL LOW (ref 150–400)
RBC: 5.19 MIL/uL (ref 4.22–5.81)
RDW: 14.3 % (ref 11.5–15.5)
Smear Review: NORMAL
WBC: 17.4 10*3/uL — ABNORMAL HIGH (ref 4.0–10.5)
nRBC: 0 % (ref 0.0–0.2)

## 2021-07-29 LAB — COMPREHENSIVE METABOLIC PANEL
ALT: 90 U/L — ABNORMAL HIGH (ref 0–44)
AST: 104 U/L — ABNORMAL HIGH (ref 15–41)
Albumin: 3.8 g/dL (ref 3.5–5.0)
Alkaline Phosphatase: 103 U/L (ref 38–126)
Anion gap: 13 (ref 5–15)
BUN: 37 mg/dL — ABNORMAL HIGH (ref 8–23)
CO2: 19 mmol/L — ABNORMAL LOW (ref 22–32)
Calcium: 8.9 mg/dL (ref 8.9–10.3)
Chloride: 99 mmol/L (ref 98–111)
Creatinine, Ser: 2.61 mg/dL — ABNORMAL HIGH (ref 0.61–1.24)
GFR, Estimated: 26 mL/min — ABNORMAL LOW (ref >60)
Glucose, Bld: 68 mg/dL — ABNORMAL LOW (ref 70–99)
Potassium: 4.1 mmol/L (ref 3.5–5.1)
Sodium: 131 mmol/L — ABNORMAL LOW (ref 135–145)
Total Bilirubin: 1.5 mg/dL — ABNORMAL HIGH (ref 0.3–1.2)
Total Protein: 7.6 g/dL (ref 6.5–8.1)

## 2021-07-29 LAB — TROPONIN I (HIGH SENSITIVITY)
Troponin I (High Sensitivity): 34 ng/L — ABNORMAL HIGH (ref <18)
Troponin I (High Sensitivity): 31 ng/L — ABNORMAL HIGH (ref <18)

## 2021-07-29 LAB — RESP PANEL BY RT-PCR (FLU A&B, COVID) ARPGX2
Influenza A by PCR: NEGATIVE
Influenza B by PCR: NEGATIVE
SARS Coronavirus 2 by RT PCR: NEGATIVE

## 2021-07-29 LAB — CBG MONITORING, ED: Glucose-Capillary: 69 mg/dL — ABNORMAL LOW (ref 70–99)

## 2021-07-29 LAB — MAGNESIUM: Magnesium: 1.5 mg/dL — ABNORMAL LOW (ref 1.7–2.4)

## 2021-07-29 LAB — PHOSPHORUS: Phosphorus: 3.4 mg/dL (ref 2.5–4.6)

## 2021-07-29 LAB — LACTIC ACID, PLASMA: Lactic Acid, Venous: 3.2 mmol/L (ref 0.5–1.9)

## 2021-07-29 MED ORDER — LACTATED RINGERS IV BOLUS (SEPSIS)
1000.0000 mL | Freq: Once | INTRAVENOUS | Status: AC
  Administered 2021-07-29: 1000 mL via INTRAVENOUS

## 2021-07-29 MED ORDER — SODIUM CHLORIDE 0.9 % IV BOLUS
500.0000 mL | Freq: Once | INTRAVENOUS | Status: AC
  Administered 2021-07-29: 500 mL via INTRAVENOUS

## 2021-07-29 MED ORDER — LACTATED RINGERS IV BOLUS (SEPSIS): 1000.0000 mL | Freq: Once | INTRAVENOUS | Status: DC

## 2021-07-29 MED ORDER — NOREPINEPHRINE 4 MG/250ML-% IV SOLN
2.0000 ug/min | INTRAVENOUS | Status: DC
  Administered 2021-07-29: 2 ug/min via INTRAVENOUS
  Administered 2021-07-29: 5 ug/min via INTRAVENOUS
  Administered 2021-07-30: 9 ug/min via INTRAVENOUS
  Filled 2021-07-29: qty 250

## 2021-07-29 MED ORDER — VANCOMYCIN HCL 2000 MG/400ML IV SOLN
2000.0000 mg | Freq: Once | INTRAVENOUS | Status: AC
  Administered 2021-07-29: 2000 mg via INTRAVENOUS
  Filled 2021-07-29: qty 400

## 2021-07-29 MED ORDER — MAGNESIUM SULFATE 4 GM/100ML IV SOLN
4.0000 g | Freq: Once | INTRAVENOUS | Status: AC
  Administered 2021-07-30: 4 g via INTRAVENOUS
  Filled 2021-07-29: qty 100

## 2021-07-29 MED ORDER — POLYETHYLENE GLYCOL 3350 17 G PO PACK
17.0000 g | PACK | Freq: Every day | ORAL | Status: DC | PRN
Start: 2021-07-29 — End: 2021-08-07
  Administered 2021-08-02: 17 g via ORAL
  Filled 2021-07-29: qty 1

## 2021-07-29 MED ORDER — ASPIRIN 81 MG PO CHEW
324.0000 mg | CHEWABLE_TABLET | Freq: Once | ORAL | Status: AC
Start: 2021-07-29 — End: 2021-07-29
  Administered 2021-07-29: 324 mg via ORAL
  Filled 2021-07-29: qty 4

## 2021-07-29 MED ORDER — SODIUM CHLORIDE 0.9 % IV SOLN
250.0000 mL | INTRAVENOUS | Status: DC
  Administered 2021-07-29 – 2021-07-30 (×2): 250 mL via INTRAVENOUS

## 2021-07-29 MED ORDER — HEPARIN SODIUM (PORCINE) 5000 UNIT/ML IJ SOLN
5000.0000 [IU] | Freq: Three times a day (TID) | INTRAMUSCULAR | Status: DC
  Administered 2021-07-29 – 2021-07-30 (×2): 5000 [IU] via SUBCUTANEOUS
  Filled 2021-07-29 (×2): qty 1

## 2021-07-29 MED ORDER — SODIUM CHLORIDE 0.9 % IV SOLN
2.0000 g | Freq: Once | INTRAVENOUS | Status: AC
  Administered 2021-07-29: 2 g via INTRAVENOUS
  Filled 2021-07-29: qty 2

## 2021-07-29 MED ORDER — DOCUSATE SODIUM 100 MG PO CAPS: 100.0000 mg | ORAL_CAPSULE | Freq: Two times a day (BID) | ORAL | Status: DC | PRN

## 2021-07-29 NOTE — ED Notes (Signed)
RustArdyth Gal NP at bedside

## 2021-07-29 NOTE — Progress Notes (Signed)
CODE SEPSIS - PHARMACY COMMUNICATION  **Broad Spectrum Antibiotics should be administered within 1 hour of Sepsis diagnosis**  Time Code Sepsis Called/Page Received: 19:50  Antibiotics Ordered: Cefepime and Vancomycin  Time of 1st antibiotic administration: Cefepime given at 20:40  Additional action taken by pharmacy: n/a  If necessary, Name of Provider/Nurse Contacted: n/a    Ian Mills ,PharmD Clinical Pharmacist  07/29/2021  8:47 PM

## 2021-07-29 NOTE — ED Notes (Signed)
CBG 69. Pt was given orange juice.

## 2021-07-29 NOTE — Progress Notes (Signed)
Notified bedside nurse of need to draw repeat lactic acid. Verified blood cultures were drawn before antibiotics were administered. Will continue to monitor code sepsis

## 2021-07-29 NOTE — ED Notes (Signed)
Pt taken to CT.

## 2021-07-29 NOTE — Progress Notes (Addendum)
Pharmacy Antibiotic Note  Ian Mills is a 66 y.o. male admitted on 07/29/2021 with sepsis.  Pharmacy has been consulted for Cefepime and Vancomycin dosing.  Plan: Cefepime 2 gm q12h per indication and renal fxn.  Pt given Vancomycin 2 gm x 1 Current Vancomycin dosing interval > 24hr.  Suspected AKI.  Pharmacy will continue to follow and will recheck SCr w/ AM labs and place additional orders as warranted.   1/02 0527 SCr: 2.48 Vancomycin 1000 mg IV Q 24 hrs.  Goal AUC 400-550. Expected AUC: 525.5   Height: 5\' 8"  (172.7 cm) Weight: 88.5 kg (195 lb) IBW/kg (Calculated) : 68.4  Temp (24hrs), Avg:98 F (36.7 C), Min:98 F (36.7 C), Max:98 F (36.7 C)  Recent Labs  Lab 07/29/21 1830 07/29/21 2026  WBC 17.4*  --   CREATININE 2.61*  --   LATICACIDVEN  --  3.2*    Estimated Creatinine Clearance: 29.7 mL/min (A) (by C-G formula based on SCr of 2.61 mg/dL (H)).    Allergies  Allergen Reactions   Nitroglycerin Nausea Only and Other (See Comments)    Per patient "causes severe  headache"    Antimicrobials this admission: 01/01 Cefepime >>  01/01 Vancomycin >>   Microbiology results: 01/01 BCx: Pending  Thank you for allowing pharmacy to be a part of this patients care.  Renda Rolls, PharmD, MBA 07/30/2021 1:08 AM

## 2021-07-29 NOTE — ED Triage Notes (Signed)
Pt via POV from home. Pt c/o hypotension and dizziness since last night. Pt endorse mid-sternal CP and SOB. Pt is A&OX4 but hypotension and tachypnea in triage.

## 2021-07-29 NOTE — Progress Notes (Signed)
Elink following code sepsis °

## 2021-07-29 NOTE — H&P (Addendum)
NAME:  Ian Mills, MRN:  [id number], DOB:  [birth year], LOS: 0 ADMISSION DATE:  07/29/2021, CONSULTATION DATE:  07/29/2021 REFERRING MD:  Dr. Jacqualine Code, CHIEF COMPLAINT:  chest pain, SOB, weakness   History of Present Illness:  66 yo M presenting to [facility] ED from home with complaints of chest tightness, shortness of breath & weakness/dizziness with hypotension. Patient reports feeling weak and falling a few days ago scraping his arm and hitting his head. The left arm is sore, swollen and draining. He also complains of low energy and checked his BP today which was low in the 70's. The hypotension and concern for infection is what prompted him to come to the ED. Of note patient has had multiple superficial boils requiring lancing & a "skewer stick" lodged in his L foot that podiatry is following outpatient. Upon physical exam below however the left arm wound is the only open/reddened/tender area. ED course: Patient received ASA for CP/ STAT EKG which was stable, and STAT troponin that while mildly elevated are flat. Patient afebrile, but because of draining wound + tachypnea & hypotension- Sepsis protocol initiated.  Medications given: ASA 325 mg, LR 2 L bolus, NS 500 mL bolus, cefepime & vancomycin ordered, peripheral levophed started Initial Vitals: afebrile- 98, RR-22, HR-74, BP- 73/48 & 97% on RA Significant labs: (Labs/ Imaging personally reviewed) I, Domingo Pulse [place], AGACNP-BC, personally viewed and interpreted this ECG. EKG Interpretation: Date: 07/29/21, EKG Time: 18:45, Rate: 80, Rhythm: NSR, QRS Axis:  LAD, Intervals: LAFB, ST/T Wave abnormalities: none, Narrative Interpretation: NSR with LAD and LAFB Chemistry: mild hyponatremia-Na+:131, AKI-BUN/Cr.: 37/2.61, Serum CO2/ AG: 19/13, mild Transaminitis: AST/ALT: 104/90 Hematology: WBC: 17.4 with left shift, Hgb: 15.5, plt: 142 Mildly elevated but flat Troponin: 34 > 31, Lactic/ PCT: 3.2/ pending, COVID-19 & Influenza A/B: negative UA:  pending, BCx ordered  CXR 07/29/21: no acute cardiopulmonary process  PCCM consulted for admission due to septic shock requiring vasopressor support.  Pertinent  Medical History  CAD s/p PCI HFrEF (echo: 06/2021- LVEF 25-30%, akinesis of LV apical segment, G1DD) HTN HLD ICM VTach s/p ICD placement Former smoker 21 pack year history (quit 10/2019) Significant Hospital Events: Including procedures, antibiotic start and stop dates in addition to other pertinent events   07/29/21: Admit to ICU with septic shock s/t suspected cellulitis requiring vasopressor support  Interim History / Subjective:  Patient with wife bedside complaining of a headache & thirst, requesting OJ. CBG checked and 69, OJ given. Because of H/a and recent fall, will obtain CTH.  Patient has no other complaints at this time and is requiring 5 mcg of levophed. Discussed suspected septic shock and plan of care including vasopressors, antibiotics & lab work. All questions answered at this time.  Objective   Blood pressure (!) 76/50, pulse 68, temperature 98 F (36.7 C), temperature source Oral, resp. rate (!) 28, height 5\' 8"  (1.727 m), weight 88.5 kg, SpO2 100 %.       No intake or output data in the 24 hours ending 07/29/21 2219 Filed Weights   07/29/21 1830  Weight: 88.5 kg    Examination: General: Adult male, acutely ill, lying in bed, NAD HEENT: MM pink/moist, anicteric, atraumatic, neck supple Neuro: A&O x 4, able to follow commands, PERRL +3, MAE CV: s1s2 RRR, NSR on monitor, no r/m/g Pulm: Regular, non labored on RA, breath sounds clear throughout GI: soft, rounded, non tender, bs x 4 Skin: LUE as below, no other open/reddened areas    Extremities: warm/dry,  pulses + 2 R/P, +1 edema noted in LUE only  Resolved Hospital Problem list     Assessment & Plan:  Sepsis with septic shock due to suspected LUE cellulitis Lactic: 3.2, Baseline PCT: pending, UA: pending, CXR: no acute process  Initial  interventions/workup included: 2.5 L of NS/LR & Cefepime/ Vancomycin - Supplemental oxygen as needed, to maintain SpO2 > 90% - f/u cultures, trend lactic/ PCT - Daily CBC, monitor WBC/ fever curve - IV antibiotics: cefepime & vancomycin  - cautious IVF hydration as LVEF is 25-30% - Continue vasopressors to maintain MAP< 65, norepinephrine - Strict I/O's: alert provider if UOP < 0.5 mL/kg/hr - f/u cortisol level > persistent hypotension consider stress dose steroids  Mildly Elevated Troponin secondary suspected demand ischemia HFrEF without acute exacerbation ICM with ICD in place HLD CAD s/p PCI PMHx: HTN, VTach Troponin: 34 > 31, echo: 12/22 LVEF 25-30%akinesis of LV apical segment, G1DD - continue outpatient: ASA, plavix, amiodarone - continuous cardiac monitoring - cautious IVF hydration - outpatient regimen on hold while patient requiring vasopressor support, consider restarting as patient stabilizes: entresto, coreg, lasix  Acute Kidney Injury superimposed on CKD Stage 3a secondary to shock in the setting of sepsis NAGMA Baseline Cr: 1.3 (average, most recent 1.24- but 1.33-1.55 over the last year) Cr on admission: 2.61 - Strict I/O's: alert provider if UOP < 0.5 mL/kg/hr - gentle IVF hydration  - Daily BMP, replace electrolytes PRN - Avoid nephrotoxic agents as able, ensure adequate renal perfusion  Mild Transaminitis secondary to shock in the setting of sepsis AST: 104, ALT: 90 - Trend hepatic function - Consider RUQ [place] if continues to uptrend - avoid hepatotoxic agents - outpatient Lipitor & Zetia on hold until hepatic function normalizes  Former Smoker - bronchodilators PRN  Anxiety - continue outpatient Xanax TID PRN Best Practice (right click and "Reselect all SmartList Selections" daily)  Diet/type: Regular consistency (see orders) DVT prophylaxis: prophylactic heparin  GI prophylaxis: N/A Lines: N/A Foley:  N/A Code Status:  full code Last date of  multidisciplinary goals of care discussion [07/29/21]  Labs   CBC: Recent Labs  Lab 07/29/21 1830  WBC 17.4*  NEUTROABS 16.2*  HGB 15.5  HCT 46.9  MCV 90.4  PLT 142*    Basic Metabolic Panel: Recent Labs  Lab 07/29/21 1830  NA 131*  K 4.1  CL 99  CO2 19*  GLUCOSE 68*  BUN 37*  CREATININE 2.61*  CALCIUM 8.9   GFR: Estimated Creatinine Clearance: 29.7 mL/min (A) (by C-G formula based on SCr of 2.61 mg/dL (H)). Recent Labs  Lab 07/29/21 1830 07/29/21 2026  WBC 17.4*  --   LATICACIDVEN  --  3.2*    Liver Function Tests: Recent Labs  Lab 07/29/21 1830  AST 104*  ALT 90*  ALKPHOS 103  BILITOT 1.5*  PROT 7.6  ALBUMIN 3.8   No results for input(s): LIPASE, AMYLASE in the last 168 hours. No results for input(s): AMMONIA in the last 168 hours.  ABG No results found for: PHART, PCO2ART, PO2ART, HCO3, TCO2, ACIDBASEDEF, O2SAT   Coagulation Profile: No results for input(s): INR, PROTIME in the last 168 hours.  Cardiac Enzymes: No results for input(s): CKTOTAL, CKMB, CKMBINDEX, TROPONINI in the last 168 hours.  HbA1C: Hgb A1c MFr Bld  Date/Time Value Ref Range Status  04/18/2021 10:38 AM 5.6 4.6 - 6.5 % Final    Comment:    Glycemic Control Guidelines for People with Diabetes:Non Diabetic:  <6%Goal of Therapy: <7%Additional  Action Suggested:  >8%   03/03/2020 02:17 PM 5.4 <5.7 % of total Hgb Final    Comment:    For the purpose of screening for the presence of diabetes: . <5.7%       Consistent with the absence of diabetes 5.7-6.4%    Consistent with increased risk for diabetes             (prediabetes) > or =6.5%  Consistent with diabetes . This assay result is consistent with a decreased risk of diabetes. . Currently, no consensus exists regarding use of hemoglobin A1c for diagnosis of diabetes in children. . According to American Diabetes Association (ADA) guidelines, hemoglobin A1c <7.0% represents optimal control in non-pregnant diabetic  patients. Different metrics may apply to specific patient populations.  Standards of Medical Care in Diabetes(ADA). .     CBG: No results for input(s): GLUCAP in the last 168 hours.  Review of Systems: Positives in BOLD  Gen: Denies fever, chills, weight change, fatigue, night sweats HEENT: Denies blurred vision, double vision, hearing loss, tinnitus, sinus congestion, rhinorrhea, sore throat, neck stiffness, dysphagia PULM: Denies shortness of breath, cough, sputum production, hemoptysis, wheezing CV: Denies chest tightness, edema, orthopnea, paroxysmal nocturnal dyspnea, palpitations GI: Denies abdominal pain, nausea, vomiting, diarrhea, hematochezia, melena, constipation, change in bowel habits GU: Denies dysuria, hematuria, polyuria, oliguria, urethral discharge Endocrine: Denies hot or cold intolerance, polyuria, polyphagia or appetite change Derm: Denies rash, dry skin, scaling or peeling skin change, swollen/tender/reddened/open area LUE Heme: Denies easy bruising, bleeding, bleeding gums Neuro: Denies headache, numbness, weakness, slurred speech, loss of memory or consciousness  Past Medical History:  He,  has a past medical history of Coronary artery disease, Family history of lung cancer, HFrEF (heart failure with reduced ejection fraction) ([place]), HLD (hyperlipidemia), HTN (hypertension), ICD  single,BSX, Ischemic cardiomyopathy, Personal history of colonic polyps, Pneumonia, Syncope and collapse, and Ventricular tachycardia (11/2011).   Surgical History:   Past Surgical History:  Procedure Laterality Date   appendectomy     CARDIAC CATHETERIZATION  Nov 30 2011   [facility]   CARDIAC DEFIBRILLATOR PLACEMENT  May 2013   COLONOSCOPY WITH PROPOFOL N/A 12/22/2020   Procedure: COLONOSCOPY WITH PROPOFOL;  Surgeon: Jonathon Bellows, MD;  Location: [facility] ENDOSCOPY;  Service: Gastroenterology;  Laterality: N/A;   CORONARY ANGIOPLASTY  2014   x 2 stents   CORONARY STENT PLACEMENT     x7    LEFT HEART CATH AND CORONARY ANGIOGRAPHY Left 08/30/2019   Procedure: LEFT HEART CATH AND CORONARY ANGIOGRAPHY;  Surgeon: Wellington Hampshire, MD;  Location: [place] [facility];  Service: Cardiovascular;  Laterality: Left;   PROSTATE SURGERY     urethra grew around prostate    TUMOR REMOVAL  2001   chest thymic cyst; behind lungs [facility] benign      Social History:   reports that he quit smoking about 20 months ago. His smoking use included cigarettes. He has a 21.00 pack-year smoking history. He has never used smokeless tobacco. He reports current alcohol use of about 8.0 standard drinks per week. He reports that he does not use drugs.   Family History:  His family history includes Cancer in his father; Cancer - Lung in his mother; Dementia in his maternal grandmother; Heart attack in his mother and paternal grandfather; Hypertension in his mother. There is no history of Colon cancer, Stomach cancer, or Pancreatic cancer.   Allergies Allergies  Allergen Reactions   Nitroglycerin Nausea Only and Other (See Comments)  Per patient "causes severe  headache"     Home Medications  Prior to Admission medications   Medication Sig Start Date End Date Taking? Authorizing Provider  albuterol (VENTOLIN HFA) 108 (90 Base) MCG/ACT inhaler Inhale 1 puff into the lungs every 6 (six) hours as needed for wheezing or shortness of breath.    [provider]  ALPRAZolam Duanne Moron) 0.5 MG tablet Take 1 tablet (0.5 mg total) by mouth 3 (three) times daily as needed. for anxiety 06/26/21   McLean-Scocuzza, Nino Glow, MD  amiodarone (PACERONE) 200 MG tablet Take 1 tablet (200 mg total) by mouth daily. 06/11/21   Deboraha Sprang, MD  aspirin 81 MG EC tablet Take 1 tablet (81 mg total) by mouth daily. 09/11/15   Minna Merritts, MD  atorvastatin (LIPITOR) 80 MG tablet Take 1 tablet (80 mg total) by mouth daily. 02/14/21   McLean-Scocuzza, Nino Glow, MD  carvedilol (COREG) 3.125 MG tablet Take 1 tablet (3.125 mg  total) by mouth 2 (two) times daily. 05/02/21   Minna Merritts, MD  clindamycin (CLEOCIN) 300 MG capsule Take 300 mg by mouth 4 (four) times daily. 06/13/21   [provider]  clopidogrel (PLAVIX) 75 MG tablet Take 1 tablet (75 mg total) by mouth daily. 06/26/21   McLean-Scocuzza, Nino Glow, MD  ezetimibe (ZETIA) 10 MG tablet Take 1 tablet (10 mg total) by mouth daily. 06/26/21   McLean-Scocuzza, Nino Glow, MD  furosemide (LASIX) 40 MG tablet Take 1 tablet (40 mg total) by mouth 2 (two) times daily. 06/26/21   McLean-Scocuzza, Nino Glow, MD  HYDROcodone-acetaminophen (NORCO) 5-325 MG tablet Take 1 tablet by mouth 2 (two) times daily as needed for moderate pain. 06/26/21   McLean-Scocuzza, Nino Glow, MD  ipratropium-albuterol (DUONEB) 0.5-2.5 (3) MG/3ML SOLN Take 3 mLs by nebulization every 6 (six) hours as needed. 08/08/20   McLean-Scocuzza, Nino Glow, MD  mupirocin ointment (BACTROBAN) 2 % Apply 1 application topically 2 (two) times daily. 06/26/21   McLean-Scocuzza, Nino Glow, MD  potassium chloride (KLOR-CON 10) 10 MEQ tablet Take 1 tablet (10 mEq total) by mouth daily. 06/26/21   McLean-Scocuzza, Nino Glow, MD  sacubitril-valsartan (ENTRESTO) 97-103 MG Take 1 tablet by mouth 2 (two) times daily. 06/29/21   Minna Merritts, MD  spironolactone (ALDACTONE) 25 MG tablet Take 1 tablet (25 mg total) by mouth daily. 07/24/21 10/22/21  Minna Merritts, MD  sulfamethoxazole-trimethoprim (BACTRIM DS) 800-160 MG tablet Take 1 tablet by mouth 2 (two) times daily. With food 06/26/21   McLean-Scocuzza, Nino Glow, MD  tadalafil (CIALIS) 10 MG tablet Take 1-2 tablets (10-20 mg total) by mouth daily as needed for erectile dysfunction. 06/29/20   Billey Co, MD  Tiotropium Bromide-Olodaterol (STIOLTO RESPIMAT) 2.5-2.5 MCG/ACT AERS Inhale 2 puffs into the lungs daily. 08/12/19   Tyler Pita, MD     Critical care time: 60 minutes       Venetia Night, AGACNP-BC Acute Care Nurse  Practitioner [facility]   [phone] / [phone] Please see Amion for pager details.

## 2021-07-29 NOTE — ED Provider Notes (Signed)
[facility] Provider Note    Event Date/Time   First MD Initiated Contact with Patient 07/29/21 1926     (approximate)   History   Hypotension   HPI  Ian Mills is a 66 y.o. male for coronary artery disease ICD, recent multiple boils and abscesses  About a 4 days ago or so he fell a skin the skin on his left arm.  He has noticed that area has been sore and swollen and draining slightly.  He is not quite felt well just feeling fatigued and low on energy with decreased appetite for the last 2 days  Today feeling very lightheaded to the point he was feels like he is in a pass out if he tries to stand up or move about.  Weak fatigued ribs left arm seems red and warm to touch concerned about infection her  Having some chest discomfort not really chest pain just like a discomfort for the last day as well.     Physical Exam   Triage Vital Signs: ED Triage Vitals  Enc Vitals Group     BP 07/29/21 1830 (!) 86/54     Pulse Rate 07/29/21 1830 82     Resp 07/29/21 1830 (!) 22     Temp 07/29/21 1830 98 F (36.7 C)     Temp Source 07/29/21 1830 Oral     SpO2 07/29/21 1830 99 %     Weight 07/29/21 1830 195 lb (88.5 kg)     Height 07/29/21 1830 5\' 8"  (1.727 m)     Head Circumference --      Peak Flow --      Pain Score 07/29/21 1835 8     Pain Loc --      Pain Edu? --      Excl. in [place]? --     Most recent vital signs: Vitals:   07/29/21 2302 07/29/21 2305  BP: 104/65 (!) 100/58  Pulse: 67 66  Resp: (!) 28 20  Temp:    SpO2: 97% 97%     General: Awake, no distress.  Does appear fatigued.  Perhaps just slightly diaphoretic CV:  Good peripheral perfusion.  Oral pharynx slightly dry Resp:  Normal effort.  Clear lung sounds.  No wheezes or rhonchi.  No noted rales. Abd:  No distention.  Other:  No peripheral edema.  No JVD.  Does not appear volume overloaded Left upper extremity demonstrates area of warmth redness and induration overlying the  left forearm.  Warm to touch.  Distal left hand warm well perfused strong radial pulse palpable.   ED Results / Procedures / Treatments   Labs (all labs ordered are listed, but only abnormal results are displayed) Labs Reviewed  COMPREHENSIVE METABOLIC PANEL - Abnormal; Notable for the following components:      Result Value   Sodium 131 (*)    CO2 19 (*)    Glucose, Bld 68 (*)    BUN 37 (*)    Creatinine, Ser 2.61 (*)    AST 104 (*)    ALT 90 (*)    Total Bilirubin 1.5 (*)    GFR, Estimated 26 (*)    All other components within normal limits  CBC WITH DIFFERENTIAL/PLATELET - Abnormal; Notable for the following components:   WBC 17.4 (*)    Platelets 142 (*)    Neutro Abs 16.2 (*)    Lymphs Abs 0.5 (*)    Abs Immature Granulocytes 0.25 (*)  All other components within normal limits  LACTIC ACID, PLASMA - Abnormal; Notable for the following components:   Lactic Acid, Venous 3.2 (*)    All other components within normal limits  MAGNESIUM - Abnormal; Notable for the following components:   Magnesium 1.5 (*)    All other components within normal limits  TROPONIN I (HIGH SENSITIVITY) - Abnormal; Notable for the following components:   Troponin I (High Sensitivity) 34 (*)    All other components within normal limits  TROPONIN I (HIGH SENSITIVITY) - Abnormal; Notable for the following components:   Troponin I (High Sensitivity) 31 (*)    All other components within normal limits  RESP PANEL BY RT-PCR (FLU A&B, COVID) ARPGX2  CULTURE, BLOOD (ROUTINE X 2)  CULTURE, BLOOD (ROUTINE X 2)  PHOSPHORUS  URINALYSIS, ROUTINE W REFLEX MICROSCOPIC  LACTIC ACID, PLASMA  PHOSPHORUS  MAGNESIUM  CBC  COMPREHENSIVE METABOLIC PANEL  PROCALCITONIN  PROCALCITONIN  HIV ANTIBODY (ROUTINE TESTING W REFLEX)     EKG  Reviewed inter by me at 1845 Heart rate 89 QRS 99 QTc 450 Normal sinus rhythm, old anteroseptal infarct.  No evidence of acute ischemia denoted   RADIOLOGY DG Chest 1  View  Result Date: 07/29/2021 CLINICAL DATA:  Chest pain.  Hypertension and dizziness EXAM: CHEST  1 VIEW COMPARISON:  One-view chest x-ray 08/23/2019 FINDINGS: AICD is stable. Heart size is normal. Lung volumes are low. No edema or effusion is present. No focal airspace disease is present. IMPRESSION: 1. Low lung volumes. 2. No acute cardiopulmonary disease. Electronically Signed   By: San Morelle M.D.   On: 07/29/2021 19:22    Interpreted as chest x-ray low lung volume no acute process to noted.     PROCEDURES:  Critical Care performed: Yes, see critical care procedure note(s)  CRITICAL CARE Performed by: Delman Kitten   Total critical care time: 35 minutes  Critical care time was exclusive of separately billable procedures and treating other patients.  Critical care was necessary to treat or prevent imminent or life-threatening deterioration.  Critical care was time spent personally by me on the following activities: development of treatment plan with patient and/or surrogate as well as nursing, discussions with consultants, evaluation of patient's response to treatment, examination of patient, obtaining history from patient or surrogate, ordering and performing treatments and interventions, ordering and review of laboratory studies, ordering and review of radiographic studies, pulse oximetry and re-evaluation of patient's condition.   Procedures   MEDICATIONS ORDERED IN ED: Medications  vancomycin (VANCOREADY) IVPB 2000 mg/400 mL (2,000 mg Intravenous New Bag/Given 07/29/21 2227)  0.9 %  sodium chloride infusion (250 mLs Intravenous New Bag/Given 07/29/21 2310)  norepinephrine (LEVOPHED) 4mg  in 237mL (0.016 mg/mL) premix infusion (5 mcg/min Intravenous New Bag/Given 07/29/21 2259)  docusate sodium (COLACE) capsule 100 mg (has no administration in time range)  polyethylene glycol (MIRALAX / GLYCOLAX) packet 17 g (has no administration in time range)  heparin injection 5,000 Units  (5,000 Units Subcutaneous Given 07/29/21 2304)  magnesium sulfate IVPB 4 g 100 mL (has no administration in time range)  sodium chloride 0.9 % bolus 500 mL (0 mLs Intravenous Stopped 07/29/21 2000)  aspirin chewable tablet 324 mg (324 mg Oral Given 07/29/21 1843)  lactated ringers bolus 1,000 mL (0 mLs Intravenous Stopped 07/29/21 2233)    And  lactated ringers bolus 1,000 mL (1,000 mLs Intravenous New Bag/Given 07/29/21 2231)  ceFEPIme (MAXIPIME) 2 g in sodium chloride 0.9 % 100 mL IVPB (0 g Intravenous  Stopped 07/29/21 2110)     IMPRESSION / MDM / ASSESSMENT AND PLAN / ED COURSE  I reviewed the triage vital signs and the nursing notes.                              Differential diagnosis includes, but is not limited to, sepsis.  Patient has evidence of infection around the left forearm with some drainage.  Will start on broad-spectrum antibiotic clued vancomycin and cefepime.  He has not felt well for a few days lightheadedness weakness and I question if perhaps some of his chest discomfort might be an anginal type equivalent secondary to hypotension.  I do not necessarily feel based on his presentation days he is suffering from acute ACS but rather I am quite concerned he separative flaring from potentially severe sepsis secondary to cellulitis of the left elbow.  No respiratory symptoms.  Clear lung sounds.  Will start broad-spectrum antibiotics.  Code sepsis initiated.  Await further laboratory testing to this point CBC is resulted with a white count of 17.4 but several labs remain   ----------------------------------------- 11:16 PM on 07/29/2021 ----------------------------------------- Patient continuing fluid bolus, blood pressure 100/58 improving.  He is resting at this time.  Awaiting admission to the ICU, has been seen evaluated by [facility] practitioner.  Patient and his wife both understand agreeable with plan for admission.         FINAL CLINICAL IMPRESSION(S) / ED  DIAGNOSES   Final diagnoses:  Left arm cellulitis  Severe sepsis ([place])  Severe sepsis   Rx / DC Orders   ED Discharge Orders     None        Note:  This document was prepared using Dragon voice recognition software and may include unintentional dictation errors.    Delman Kitten, MD 07/29/21 2318

## 2021-07-29 NOTE — Progress Notes (Signed)
PHARMACY -  BRIEF ANTIBIOTIC NOTE   Pharmacy has received consult(s) for Cefepime and Vancomycin from an ED provider.  The patient's profile has been reviewed for ht/wt/allergies/indication/available labs.    One time order(s) placed for Cefepime 2g and Vancomycin 2g  Further antibiotics/pharmacy consults should be ordered by admitting physician if indicated.                       Thank you, Vira Blanco 07/29/2021  8:29 PM

## 2021-07-29 NOTE — ED Provider Notes (Signed)
°  Emergency Medicine Provider Triage Evaluation Note  Ian Mills , a 66 y.o.male,  was evaluated in triage.  Pt complains of chest pain and hypotension.  Patient states that this began earlier this morning.  Blood pressure is normally 118/80.  This morning it was approximately 70/40.  Patient states that he feels chest tightness and shortness of breath.    Review of Systems  Positive: Chest pain, shortness of breath, weakness Negative: Denies fever, abdominal pain, vomiting  Physical Exam   Vitals:   07/29/21 1830  BP: (!) 86/54  Pulse: 82  Resp: (!) 22  Temp: 98 F (36.7 C)  SpO2: 99%   Gen:   Awake, no distress   Resp:  Normal effort  MSK:   Moves extremities without difficulty  Other:    Medical Decision Making  Given the patient's initial medical screening exam, the following diagnostic evaluation has been ordered. The patient will be placed in the appropriate treatment space, once one is available, to complete the evaluation and treatment. I have discussed the plan of care with the patient and I have advised the patient that an ED physician or mid-level practitioner will reevaluate their condition after the test results have been received, as the results may give them additional insight into the type of treatment they may need.    Diagnostics: Labs, EKG, CXR, respiratory panel  Treatments: Aspirin   Teodoro Spray, PA 07/29/21 Bosie Helper    Lucrezia Starch, MD 07/29/21 Drema Halon

## 2021-07-30 ENCOUNTER — Inpatient Hospital Stay: Payer: Medicare Other

## 2021-07-30 LAB — CBC
HCT: 41.5 % (ref 39.0–52.0)
Hemoglobin: 13.6 g/dL (ref 13.0–17.0)
MCH: 29.8 pg (ref 26.0–34.0)
MCHC: 32.8 g/dL (ref 30.0–36.0)
MCV: 91 fL (ref 80.0–100.0)
Platelets: 122 10*3/uL — ABNORMAL LOW (ref 150–400)
RBC: 4.56 MIL/uL (ref 4.22–5.81)
RDW: 14.5 % (ref 11.5–15.5)
WBC: 20.3 10*3/uL — ABNORMAL HIGH (ref 4.0–10.5)
nRBC: 0 % (ref 0.0–0.2)

## 2021-07-30 LAB — BLOOD CULTURE ID PANEL (REFLEXED) - BCID2

## 2021-07-30 LAB — GLUCOSE, CAPILLARY: Glucose-Capillary: 88 mg/dL (ref 70–99)

## 2021-07-30 LAB — HIV ANTIBODY (ROUTINE TESTING W REFLEX): HIV Screen 4th Generation wRfx: NONREACTIVE

## 2021-07-30 LAB — COMPREHENSIVE METABOLIC PANEL
ALT: 106 U/L — ABNORMAL HIGH (ref 0–44)
AST: 105 U/L — ABNORMAL HIGH (ref 15–41)
Albumin: 3 g/dL — ABNORMAL LOW (ref 3.5–5.0)
Alkaline Phosphatase: 94 U/L (ref 38–126)
Anion gap: 12 (ref 5–15)
BUN: 40 mg/dL — ABNORMAL HIGH (ref 8–23)
CO2: 20 mmol/L — ABNORMAL LOW (ref 22–32)
Calcium: 8 mg/dL — ABNORMAL LOW (ref 8.9–10.3)
Chloride: 101 mmol/L (ref 98–111)
Creatinine, Ser: 2.48 mg/dL — ABNORMAL HIGH (ref 0.61–1.24)
GFR, Estimated: 28 mL/min — ABNORMAL LOW (ref >60)
Glucose, Bld: 93 mg/dL (ref 70–99)
Potassium: 4.1 mmol/L (ref 3.5–5.1)
Sodium: 133 mmol/L — ABNORMAL LOW (ref 135–145)
Total Bilirubin: 1.6 mg/dL — ABNORMAL HIGH (ref 0.3–1.2)
Total Protein: 6.3 g/dL — ABNORMAL LOW (ref 6.5–8.1)

## 2021-07-30 LAB — LACTIC ACID, PLASMA: Lactic Acid, Venous: 2.8 mmol/L (ref 0.5–1.9)

## 2021-07-30 LAB — PROCALCITONIN
Procalcitonin: 27.38 ng/mL
Procalcitonin: 18.93 ng/mL

## 2021-07-30 LAB — PHOSPHORUS: Phosphorus: 3.1 mg/dL (ref 2.5–4.6)

## 2021-07-30 LAB — MAGNESIUM: Magnesium: 2.6 mg/dL — ABNORMAL HIGH (ref 1.7–2.4)

## 2021-07-30 LAB — CORTISOL: Cortisol, Plasma: 37.7 ug/dL

## 2021-07-30 LAB — CBG MONITORING, ED: Glucose-Capillary: 97 mg/dL (ref 70–99)

## 2021-07-30 MED ORDER — IPRATROPIUM-ALBUTEROL 0.5-2.5 (3) MG/3ML IN SOLN: 3.0000 mL | Freq: Four times a day (QID) | RESPIRATORY_TRACT | Status: DC | PRN

## 2021-07-30 MED ORDER — NOREPINEPHRINE 16 MG/250ML-% IV SOLN
0.0000 ug/min | INTRAVENOUS | Status: DC
  Administered 2021-07-30: 10 ug/min via INTRAVENOUS
  Administered 2021-07-31: 11 ug/min via INTRAVENOUS
  Filled 2021-07-30 (×3): qty 250

## 2021-07-30 MED ORDER — AMIODARONE HCL 200 MG PO TABS
200.0000 mg | ORAL_TABLET | Freq: Every day | ORAL | Status: DC
  Administered 2021-07-30 – 2021-08-07 (×9): 200 mg via ORAL
  Filled 2021-07-30 (×9): qty 1

## 2021-07-30 MED ORDER — CLINDAMYCIN PHOSPHATE 600 MG/50ML IV SOLN
600.0000 mg | Freq: Three times a day (TID) | INTRAVENOUS | Status: DC
  Administered 2021-07-30 – 2021-08-02 (×10): 600 mg via INTRAVENOUS
  Filled 2021-07-30 (×12): qty 50

## 2021-07-30 MED ORDER — CHLORHEXIDINE GLUCONATE CLOTH 2 % EX PADS
6.0000 | MEDICATED_PAD | Freq: Every day | CUTANEOUS | Status: DC
  Administered 2021-07-31 – 2021-08-06 (×7): 6 via TOPICAL

## 2021-07-30 MED ORDER — VANCOMYCIN HCL IN DEXTROSE 1-5 GM/200ML-% IV SOLN
1000.0000 mg | INTRAVENOUS | Status: DC
  Filled 2021-07-30: qty 200

## 2021-07-30 MED ORDER — CEFAZOLIN SODIUM-DEXTROSE 2-4 GM/100ML-% IV SOLN
2.0000 g | Freq: Three times a day (TID) | INTRAVENOUS | Status: DC
  Administered 2021-07-30 – 2021-08-03 (×11): 2 g via INTRAVENOUS
  Filled 2021-07-30 (×20): qty 100

## 2021-07-30 MED ORDER — ALPRAZOLAM 0.5 MG PO TABS
0.5000 mg | ORAL_TABLET | Freq: Three times a day (TID) | ORAL | Status: DC | PRN
  Administered 2021-07-30 – 2021-08-07 (×16): 0.5 mg via ORAL
  Filled 2021-07-30 (×16): qty 1

## 2021-07-30 MED ORDER — LACTATED RINGERS IV BOLUS
500.0000 mL | Freq: Once | INTRAVENOUS | Status: AC
  Administered 2021-07-30: 500 mL via INTRAVENOUS

## 2021-07-30 MED ORDER — NOREPINEPHRINE 4 MG/250ML-% IV SOLN
INTRAVENOUS | Status: AC
  Filled 2021-07-30: qty 250

## 2021-07-30 MED ORDER — ALBUTEROL SULFATE HFA 108 (90 BASE) MCG/ACT IN AERS: 1.0000 | INHALATION_SPRAY | Freq: Four times a day (QID) | RESPIRATORY_TRACT | Status: DC | PRN

## 2021-07-30 MED ORDER — SODIUM CHLORIDE 0.9 % IV SOLN
2.0000 g | Freq: Two times a day (BID) | INTRAVENOUS | Status: DC
  Administered 2021-07-30: 2 g via INTRAVENOUS
  Filled 2021-07-30 (×2): qty 2

## 2021-07-30 MED ORDER — VANCOMYCIN VARIABLE DOSE PER UNSTABLE RENAL FUNCTION (PHARMACIST DOSING): Status: DC

## 2021-07-30 MED ORDER — HYDROCORTISONE SOD SUC (PF) 100 MG IJ SOLR
100.0000 mg | Freq: Once | INTRAMUSCULAR | Status: AC
  Administered 2021-07-30: 100 mg via INTRAVENOUS
  Filled 2021-07-30: qty 2

## 2021-07-30 MED ORDER — CLOPIDOGREL BISULFATE 75 MG PO TABS
75.0000 mg | ORAL_TABLET | Freq: Every day | ORAL | Status: DC
  Administered 2021-07-30 – 2021-08-07 (×8): 75 mg via ORAL
  Filled 2021-07-30 (×8): qty 1

## 2021-07-30 MED ORDER — ASPIRIN EC 81 MG PO TBEC
81.0000 mg | DELAYED_RELEASE_TABLET | Freq: Every day | ORAL | Status: DC
  Administered 2021-07-30 – 2021-08-07 (×8): 81 mg via ORAL
  Filled 2021-07-30 (×8): qty 1

## 2021-07-30 MED ORDER — DAKINS (1/4 STRENGTH) 0.125 % EX SOLN
Freq: Two times a day (BID) | CUTANEOUS | Status: AC
  Administered 2021-08-01: 1 via TOPICAL
  Filled 2021-07-30 (×2): qty 473

## 2021-07-30 MED ORDER — ALBUTEROL SULFATE (2.5 MG/3ML) 0.083% IN NEBU: 2.5000 mg | INHALATION_SOLUTION | Freq: Four times a day (QID) | RESPIRATORY_TRACT | Status: DC | PRN

## 2021-07-30 MED ORDER — ACETAMINOPHEN 325 MG PO TABS
650.0000 mg | ORAL_TABLET | Freq: Once | ORAL | Status: AC
  Administered 2021-07-30: 650 mg via ORAL
  Filled 2021-07-30: qty 2

## 2021-07-30 MED ORDER — SILVER SULFADIAZINE 1 % EX CREA
TOPICAL_CREAM | Freq: Two times a day (BID) | CUTANEOUS | Status: DC
  Administered 2021-08-01: 1 via TOPICAL
  Filled 2021-07-30 (×2): qty 85

## 2021-07-30 NOTE — Procedures (Signed)
Central Venous Catheter Insertion Procedure Note  CASE VASSELL  [id number]  [birth year]  Date:07/30/21  Time:7:19 AM   Provider Performing:Jenney Brester L [place]   Procedure: Insertion of Non-tunneled Central Venous [phone]) with US guidance ([id number])   Indication(s) Medication administration  Consent Risks of the procedure as well as the alternatives and risks of each were explained to the patient and/or caregiver.  Consent for the procedure was obtained and is signed in the bedside chart  Anesthesia Topical only with 1% lidocaine   Timeout Verified patient identification, verified procedure, site/side was marked, verified correct patient position, special equipment/implants available, medications/allergies/relevant history reviewed, required imaging and test results available.  Sterile Technique Maximal sterile technique including full sterile barrier drape, hand hygiene, sterile gown, sterile gloves, mask, hair covering, sterile ultrasound probe cover (if used).  Procedure Description Area of catheter insertion was cleaned with chlorhexidine and draped in sterile fashion.  With real-time ultrasound guidance a central venous catheter was placed into the left femoral vein. Nonpulsatile blood flow and easy flushing noted in all ports.  The catheter was sutured in place and sterile dressing applied.  Complications/Tolerance None; patient tolerated the procedure well. Chest X-ray is ordered to verify placement for internal jugular or subclavian cannulation.   Chest x-ray is not ordered for femoral cannulation.  EBL Minimal  Specimen(s) None   Venetia Night, AGACNP-BC Acute Care Nurse Practitioner [facility]   [phone] / [phone] Please see Amion for pager details.

## 2021-07-30 NOTE — Consult Note (Signed)
PHARMACY CONSULT NOTE  Pharmacy Consult for Electrolyte Monitoring and Replacement   Recent Labs: Potassium (mmol/L)  Date Value  07/30/2021 4.1  05/30/2014 3.9   Magnesium (mg/dL)  Date Value  07/30/2021 2.6 (H)  06/28/2013 1.9   Calcium (mg/dL)  Date Value  07/30/2021 8.0 (L)   Calcium, Total (mg/dL)  Date Value  05/30/2014 8.1 (L)   Albumin (g/dL)  Date Value  07/30/2021 3.0 (L)  04/06/2021 3.8  05/29/2014 3.6   Phosphorus (mg/dL)  Date Value  07/30/2021 3.1   Sodium (mmol/L)  Date Value  07/30/2021 133 (L)  04/06/2021 139  05/30/2014 137   Assessment: Patient is a 66 y/o M with medical history including CAD, HFrEF, HTN, HLD, Vtach s/p ICD, history of tobacco abuse who is admitted with septic shock secondary to left forearm cellulitis complicated by GAS bacteremia. Pharmacy consulted to assist with electrolyte monitoring and replacement as indicated.  Goal of Therapy:  Electrolytes within normal limits  Plan:  --No electrolyte replacement indicated at this time --Follow-up electrolytes with AM labs tomorrow  Benita Gutter 07/30/2021 11:56 AM

## 2021-07-30 NOTE — Consult Note (Signed)
WOC Nurse Consult Note: Patient receiving care in [facility] ICU 14. Consult completed remotely after review of record and photos. Reason for Consult: left arm cellulitis Wound type: trauma injury Pressure Injury POA: Yes/No/NA Measurement: Wound bed: see photo Drainage (amount, consistency, odor)  Periwound: discolored Dressing procedure/placement/frequency: Twice daily cleansing with 1/4% Dakin's solution, then application of Silvadene and vaseline gauze, secure with kerlix.  [facility] nurse will not follow at this time.  Please re-consult the [place] team if needed.  Val Riles, RN, MSN, CWOCN, CNS-BC, pager [phone]

## 2021-07-30 NOTE — Progress Notes (Incomplete)
Pharmacy Antibiotic Note  Ian Mills is a 66 y.o. male admitted on 07/29/2021 with sepsis.  Pharmacy has been consulted for Cefepime and Vancomycin dosing.  Plan: Cefepime 2 gm q12h per indication and renal fxn.  Pt given Vancomycin 2 gm x 1 Current Vancomycin dosing interval > 24hr.  Suspected AKI.  Pharmacy will continue to follow and will recheck SCr w/ AM labs and place additional orders as warranted.   1/02 0527 SCr: 2.48 Vancomycin 1000 mg IV Q 24 hrs.  Goal AUC 400-550. Expected AUC: 525.5   Height: 5\' 9"  (175.3 cm) Weight: 93.1 kg (205 lb 4 oz) IBW/kg (Calculated) : 70.7  Temp (24hrs), Avg:97.9 F (36.6 C), Min:97.8 F (36.6 C), Max:98 F (36.7 C)  Recent Labs  Lab 07/29/21 1830 07/29/21 2026 07/30/21 0029 07/30/21 0527  WBC 17.4*  --   --  20.3*  CREATININE 2.61*  --   --  2.48*  LATICACIDVEN  --  3.2* 2.8*  --      Estimated Creatinine Clearance: 32.6 mL/min (A) (by C-G formula based on SCr of 2.48 mg/dL (H)).    Allergies  Allergen Reactions   Nitroglycerin Nausea Only and Other (See Comments)    Per patient "causes severe  headache"    Antimicrobials this admission: 01/01 Cefepime >>  01/01 Vancomycin >>   Microbiology results: 01/01 BCx: Pending  Thank you for allowing pharmacy to be a part of this patients care.  Renda Rolls, PharmD, [facility] 07/30/2021 7:20 AM

## 2021-07-30 NOTE — Consult Note (Incomplete)
PHARMACY CONSULT NOTE - FOLLOW UP  Pharmacy Consult for Electrolyte Monitoring and Replacement   Recent Labs: Potassium (mmol/L)  Date Value  07/30/2021 4.1  05/30/2014 3.9   Magnesium (mg/dL)  Date Value  07/30/2021 2.6 (H)  06/28/2013 1.9   Calcium (mg/dL)  Date Value  07/30/2021 8.0 (L)   Calcium, Total (mg/dL)  Date Value  05/30/2014 8.1 (L)   Albumin (g/dL)  Date Value  07/30/2021 3.0 (L)  04/06/2021 3.8  05/29/2014 3.6   Phosphorus (mg/dL)  Date Value  07/30/2021 3.1   Sodium (mmol/L)  Date Value  07/30/2021 133 (L)  04/06/2021 139  05/30/2014 137     Assessment: ***  Goal of Therapy:  ***  Plan:  ***  Narda Rutherford ,PharmD Clinical Pharmacist 07/30/2021 7:24 AM

## 2021-07-30 NOTE — Consult Note (Signed)
Subjective:   CC: left forearm cellulitis  HPI:  Ian Mills is a 66 y.o. male who was consulted by [facility] for evaluation of above.  First noted a few days ago.  Fell and scraped area. Symptoms include: Pain is sharp, localized to left forearm.  Exacerbated by touch.  Alleviated by nothing specific.  Associated with drainage that started recently as well as swelling.    No worsening pain with full elbow and wrist, finger ROM.   Past Medical History:  has a past medical history of Coronary artery disease, Family history of lung cancer, HFrEF (heart failure with reduced ejection fraction) ([place]), HLD (hyperlipidemia), HTN (hypertension), ICD  single,BSX, Ischemic cardiomyopathy, Personal history of colonic polyps, Pneumonia, Syncope and collapse, and Ventricular tachycardia (11/2011).  Past Surgical History:  has a past surgical history that includes Cardiac defibrillator placement (May 2013); Tumor removal (2001); appendectomy; Cardiac catheterization (Nov 30 2011); Coronary angioplasty (2014); Coronary stent placement; Prostate surgery; LEFT HEART CATH AND CORONARY ANGIOGRAPHY (Left, 08/30/2019); and Colonoscopy with propofol (N/A, 12/22/2020).  Family History: family history includes Cancer in his father; Cancer - Lung in his mother; Dementia in his maternal grandmother; Heart attack in his mother and paternal grandfather; Hypertension in his mother.  Social History:  reports that he quit smoking about 20 months ago. His smoking use included cigarettes. He has a 21.00 pack-year smoking history. He has never used smokeless tobacco. He reports current alcohol use of about 8.0 standard drinks per week. He reports that he does not use drugs.  Current Medications:  Prior to Admission medications   Medication Sig Start Date End Date Taking? Authorizing Provider  albuterol (VENTOLIN HFA) 108 (90 Base) MCG/ACT inhaler Inhale 1 puff into the lungs every 6 (six) hours as needed for wheezing or shortness  of breath.    [provider]  ALPRAZolam Duanne Moron) 0.5 MG tablet Take 1 tablet (0.5 mg total) by mouth 3 (three) times daily as needed. for anxiety 06/26/21   McLean-Scocuzza, Nino Glow, MD  amiodarone (PACERONE) 200 MG tablet Take 1 tablet (200 mg total) by mouth daily. 06/11/21   Deboraha Sprang, MD  aspirin 81 MG EC tablet Take 1 tablet (81 mg total) by mouth daily. 09/11/15   Minna Merritts, MD  atorvastatin (LIPITOR) 80 MG tablet Take 1 tablet (80 mg total) by mouth daily. 02/14/21   McLean-Scocuzza, Nino Glow, MD  carvedilol (COREG) 3.125 MG tablet Take 1 tablet (3.125 mg total) by mouth 2 (two) times daily. 05/02/21   Minna Merritts, MD  clindamycin (CLEOCIN) 300 MG capsule Take 300 mg by mouth 4 (four) times daily. 06/13/21   [provider]  clopidogrel (PLAVIX) 75 MG tablet Take 1 tablet (75 mg total) by mouth daily. 06/26/21   McLean-Scocuzza, Nino Glow, MD  ezetimibe (ZETIA) 10 MG tablet Take 1 tablet (10 mg total) by mouth daily. 06/26/21   McLean-Scocuzza, Nino Glow, MD  furosemide (LASIX) 40 MG tablet Take 1 tablet (40 mg total) by mouth 2 (two) times daily. 06/26/21   McLean-Scocuzza, Nino Glow, MD  HYDROcodone-acetaminophen (NORCO) 5-325 MG tablet Take 1 tablet by mouth 2 (two) times daily as needed for moderate pain. 06/26/21   McLean-Scocuzza, Nino Glow, MD  ipratropium-albuterol (DUONEB) 0.5-2.5 (3) MG/3ML SOLN Take 3 mLs by nebulization every 6 (six) hours as needed. 08/08/20   McLean-Scocuzza, Nino Glow, MD  mupirocin ointment (BACTROBAN) 2 % Apply 1 application topically 2 (two) times daily. 06/26/21   McLean-Scocuzza, Nino Glow, MD  potassium chloride (KLOR-CON 10) 10 MEQ tablet Take 1 tablet (10 mEq total) by mouth daily. 06/26/21   McLean-Scocuzza, Nino Glow, MD  sacubitril-valsartan (ENTRESTO) 97-103 MG Take 1 tablet by mouth 2 (two) times daily. 06/29/21   Minna Merritts, MD  spironolactone (ALDACTONE) 25 MG tablet Take 1 tablet (25 mg total) by mouth daily. 07/24/21  10/22/21  Minna Merritts, MD  sulfamethoxazole-trimethoprim (BACTRIM DS) 800-160 MG tablet Take 1 tablet by mouth 2 (two) times daily. With food 06/26/21   McLean-Scocuzza, Nino Glow, MD  tadalafil (CIALIS) 10 MG tablet Take 1-2 tablets (10-20 mg total) by mouth daily as needed for erectile dysfunction. 06/29/20   Billey Co, MD  Tiotropium Bromide-Olodaterol (STIOLTO RESPIMAT) 2.5-2.5 MCG/ACT AERS Inhale 2 puffs into the lungs daily. 08/12/19   Tyler Pita, MD    Allergies:  Allergies  Allergen Reactions   Nitroglycerin Nausea Only and Other (See Comments)    Per patient "causes severe  headache"    ROS:  General: Denies weight loss, weight gain, fatigue, fevers, chills, and night sweats. Eyes: Denies blurry vision, double vision, eye pain, itchy eyes, and tearing. Ears: Denies hearing loss, earache, and ringing in ears. Nose: Denies sinus pain, congestion, infections, runny nose, and nosebleeds. Mouth/throat: Denies hoarseness, sore throat, bleeding gums, and difficulty swallowing. Heart: Denies chest pain, palpitations, racing heart, irregular heartbeat, leg pain or swelling, and decreased activity tolerance. Respiratory: Denies breathing difficulty, shortness of breath, wheezing, cough, and sputum. GI: Denies change in appetite, heartburn, nausea, vomiting, constipation, diarrhea, and blood in stool. GU: Denies difficulty urinating, pain with urinating, urgency, frequency, blood in urine. Musculoskeletal: Denies joint stiffness, pain, swelling, muscle weakness. Skin: Denies rash, itching, mass, tumors, sores, and boils Neurologic: Denies headache, fainting, dizziness, seizures, numbness, and tingling. Psychiatric: Denies depression, anxiety, difficulty sleeping, and memory loss. Endocrine: Denies heat or cold intolerance, and increased thirst or urination. Blood/lymph: Denies easy bruising, easy bruising, and swollen glands     Objective:     BP (!) 93/51    Pulse 71     Temp 97.8 F (36.6 C) (Oral)    Resp 18    Ht 5\' 9"  (1.753 m)    Wt 93.1 kg    SpO2 94%    BMI 30.31 kg/m   Constitutional :  alert, cooperative, appears stated age, and no distress  Lymphatics/Throat:  no asymmetry, masses, or scars  Respiratory:  clear to auscultation bilaterally  Cardiovascular:  regular rate and rhythm  Gastrointestinal: soft, non-tender; bowel sounds normal; no masses,  no organomegaly.  Musculoskeletal: Steady gait and movement  Skin: Cool and moist, left forearm cellulitis as noted in pic.  No palpable crepitus, induration.  Serous drainage around sloughing skin in central area.  Erythema noted extending to wrist or elbow at this time.  Psychiatric: Normal affect, non-agitated, not confused       LABS:  CMP Latest Ref Rng & Units 07/30/2021 07/29/2021 06/26/2021  Glucose 70 - 99 mg/dL 93 68(L) 74  BUN 8 - 23 mg/dL 40(H) 37(H) 23  Creatinine 0.61 - 1.24 mg/dL 2.48(H) 2.61(H) 1.24  Sodium 135 - 145 mmol/L 133(L) 131(L) 136  Potassium 3.5 - 5.1 mmol/L 4.1 4.1 3.9  Chloride 98 - 111 mmol/L 101 99 105  CO2 22 - 32 mmol/L 20(L) 19(L) 24  Calcium 8.9 - 10.3 mg/dL 8.0(L) 8.9 9.0  Total Protein 6.5 - 8.1 g/dL 6.3(L) 7.6 7.7  Total Bilirubin 0.3 - 1.2 mg/dL 1.6(H) 1.5(H) 0.9  Alkaline  Phos 38 - 126 U/L 94 103 99  AST 15 - 41 U/L 105(H) 104(H) 31  ALT 0 - 44 U/L 106(H) 90(H) 43   CBC Latest Ref Rng & Units 07/30/2021 07/29/2021 06/26/2021  WBC 4.0 - 10.5 K/uL 20.3(H) 17.4(H) 9.2  Hemoglobin 13.0 - 17.0 g/dL 13.6 15.5 15.0  Hematocrit 39.0 - 52.0 % 41.5 46.9 46.1  Platelets 150 - 400 K/uL 122(L) 142(L) 173    RADS: Pending x-ray  Assessment:      Left forearm cellulitis Gram positive cocci septicemia Septic shock requiring vasopressors   Plan:     Source of septicemia likely from forearm wound celullitis.  No obvious area of drainable abscess but will start with plain films for assess for subq air.  Ideally hold off on CT if possible until Cr improves so we  can obtain with contrast if needed.  Continue IV abx and pressor support per ICU team in the meantime.

## 2021-07-30 NOTE — Progress Notes (Signed)
NAME:  Ian Mills, MRN:  [id number], DOB:  04/10/1956, LOS: 1 ADMISSION DATE:  07/29/2021, CONSULTATION DATE:  07/29/2021 REFERRING MD:  Dr. Jacqualine Code, CHIEF COMPLAINT:  chest pain, SOB, weakness   History of Present Illness:  66 yo M presenting to [facility] ED from home with complaints of chest tightness, shortness of breath & weakness/dizziness with hypotension. Patient reports feeling weak and falling a few days ago scraping his arm and hitting his head. The left arm is sore, swollen and draining. He also complains of low energy and checked his BP today which was low in the 70's. The hypotension and concern for infection is what prompted him to come to the ED. Of note patient has had multiple superficial boils requiring lancing & a "skewer stick" lodged in his L foot that podiatry is following outpatient. Upon physical exam below however the left arm wound is the only open/reddened/tender area.  07/30/21- patient is slighly improved, s/p surgery evaluation for abcess/cellulitis of upper extermity  Pertinent  Medical History  CAD s/p PCI HFrEF (echo: 06/2021- LVEF 25-30%, akinesis of LV apical segment, G1DD) HTN HLD ICM VTach s/p ICD placement Former smoker 21 pack year history (quit 10/2019) Significant Hospital Events: Including procedures, antibiotic start and stop dates in addition to other pertinent events   07/29/21: Admit to ICU with septic shock s/t suspected cellulitis requiring vasopressor support   Objective   Blood pressure (!) 91/57, pulse 68, temperature 97.8 F (36.6 C), temperature source Oral, resp. rate 18, height 5\' 9"  (1.753 m), weight 93.1 kg, SpO2 96 %.        Intake/Output Summary (Last 24 hours) at 07/30/2021 0856 Last data filed at 07/30/2021 0847 Gross per 24 hour  Intake 1234.58 ml  Output 800 ml  Net 434.58 ml   Filed Weights   07/29/21 1830 07/30/21 0510  Weight: 88.5 kg 93.1 kg    Examination: General: Adult male, acutely ill, lying in bed, NAD HEENT: MM  pink/moist, anicteric, atraumatic, neck supple Neuro: A&O x 4, able to follow commands, PERRL +3, MAE CV: s1s2 RRR, NSR on monitor, no r/m/g Pulm: Regular, non labored on RA, breath sounds clear throughout GI: soft, rounded, non tender, bs x 4 Skin: LUE as below, no other open/reddened areas    Extremities: warm/dry, pulses + 2 R/P, +1 edema noted in LUE only  Resolved Hospital Problem list     Assessment & Plan:  Sepsis with septic shock due to suspected LUE cellulitis Lactic: 3.2, Baseline PCT: pending, UA: pending, CXR: no acute process  Initial interventions/workup included: 2.5 L of NS/LR & Cefepime/ Vancomycin - Supplemental oxygen as needed, to maintain SpO2 > 90% - f/u cultures, trend lactic/ PCT - Daily CBC, monitor WBC/ fever curve - IV antibiotics: cefepime & vancomycin  - cautious IVF hydration as LVEF is 25-30% - Continue vasopressors to maintain MAP< 65, norepinephrine - Strict I/O's: alert provider if UOP < 0.5 mL/kg/hr - f/u cortisol level > persistent hypotension consider stress dose steroids -surg consult  Mildly Elevated Troponin secondary suspected demand ischemia HFrEF without acute exacerbation ICM with ICD in place HLD CAD s/p PCI PMHx: HTN, VTach Troponin: 34 > 31, echo: 12/22 LVEF 25-30%akinesis of LV apical segment, G1DD - continue outpatient: ASA, plavix, amiodarone - continuous cardiac monitoring - cautious IVF hydration - outpatient regimen on hold while patient requiring vasopressor support, consider restarting as patient stabilizes: entresto, coreg, lasix  Acute Kidney Injury superimposed on CKD Stage 3a secondary to shock in the  setting of sepsis NAGMA Baseline Cr: 1.3 (average, most recent 1.24- but 1.33-1.55 over the last year) Cr on admission: 2.61 - Strict I/O's: alert provider if UOP < 0.5 mL/kg/hr - gentle IVF hydration  - Daily BMP, replace electrolytes PRN - Avoid nephrotoxic agents as able, ensure adequate renal perfusion  Mild  Transaminitis secondary to shock in the setting of sepsis AST: 104, ALT: 90 - Trend hepatic function - Consider RUQ [place] if continues to uptrend - avoid hepatotoxic agents - outpatient Lipitor & Zetia on hold until hepatic function normalizes  Former Smoker - bronchodilators PRN  Anxiety - continue outpatient Xanax TID PRN Best Practice (right click and "Reselect all SmartList Selections" daily)  Diet/type: Regular consistency (see orders) DVT prophylaxis: prophylactic heparin  GI prophylaxis: N/A Lines: N/A Foley:  N/A Code Status:  full code Last date of multidisciplinary goals of care discussion [07/29/21]  Labs   CBC: Recent Labs  Lab 07/29/21 1830 07/30/21 0527  WBC 17.4* 20.3*  NEUTROABS 16.2*  --   HGB 15.5 13.6  HCT 46.9 41.5  MCV 90.4 91.0  PLT 142* 122*     Basic Metabolic Panel: Recent Labs  Lab 07/29/21 1830 07/29/21 2026 07/30/21 0527  NA 131*  --  133*  K 4.1  --  4.1  CL 99  --  101  CO2 19*  --  20*  GLUCOSE 68*  --  93  BUN 37*  --  40*  CREATININE 2.61*  --  2.48*  CALCIUM 8.9  --  8.0*  MG  --  1.5* 2.6*  PHOS  --  3.4 3.1    GFR: Estimated Creatinine Clearance: 32.6 mL/min (A) (by C-G formula based on SCr of 2.48 mg/dL (H)). Recent Labs  Lab 07/29/21 1830 07/29/21 2026 07/30/21 0029 07/30/21 0527  PROCALCITON  --  27.38  --  18.93  WBC 17.4*  --   --  20.3*  LATICACIDVEN  --  3.2* 2.8*  --      Liver Function Tests: Recent Labs  Lab 07/29/21 1830 07/30/21 0527  AST 104* 105*  ALT 90* 106*  ALKPHOS 103 94  BILITOT 1.5* 1.6*  PROT 7.6 6.3*  ALBUMIN 3.8 3.0*    No results for input(s): LIPASE, AMYLASE in the last 168 hours. No results for input(s): AMMONIA in the last 168 hours.  ABG No results found for: PHART, PCO2ART, PO2ART, HCO3, TCO2, ACIDBASEDEF, O2SAT   Coagulation Profile: No results for input(s): INR, PROTIME in the last 168 hours.  Cardiac Enzymes: No results for input(s): CKTOTAL, CKMB, CKMBINDEX,  TROPONINI in the last 168 hours.  HbA1C: Hgb A1c MFr Bld  Date/Time Value Ref Range Status  04/18/2021 10:38 AM 5.6 4.6 - 6.5 % Final    Comment:    Glycemic Control Guidelines for People with Diabetes:Non Diabetic:  <6%Goal of Therapy: <7%Additional Action Suggested:  >8%   03/03/2020 02:17 PM 5.4 <5.7 % of total Hgb Final    Comment:    For the purpose of screening for the presence of diabetes: . <5.7%       Consistent with the absence of diabetes 5.7-6.4%    Consistent with increased risk for diabetes             (prediabetes) > or =6.5%  Consistent with diabetes . This assay result is consistent with a decreased risk of diabetes. . Currently, no consensus exists regarding use of hemoglobin A1c for diagnosis of diabetes in children. . According to American Diabetes  Association (ADA) guidelines, hemoglobin A1c <7.0% represents optimal control in non-pregnant diabetic patients. Different metrics may apply to specific patient populations.  Standards of Medical Care in Diabetes(ADA). .     CBG: Recent Labs  Lab 07/29/21 2319 07/30/21 0035 07/30/21 0510  GLUCAP 69* 97 88    Review of Systems: Positives in BOLD  Gen: Denies fever, chills, weight change, fatigue, night sweats HEENT: Denies blurred vision, double vision, hearing loss, tinnitus, sinus congestion, rhinorrhea, sore throat, neck stiffness, dysphagia PULM: Denies shortness of breath, cough, sputum production, hemoptysis, wheezing CV: Denies chest tightness, edema, orthopnea, paroxysmal nocturnal dyspnea, palpitations GI: Denies abdominal pain, nausea, vomiting, diarrhea, hematochezia, melena, constipation, change in bowel habits GU: Denies dysuria, hematuria, polyuria, oliguria, urethral discharge Endocrine: Denies hot or cold intolerance, polyuria, polyphagia or appetite change Derm: Denies rash, dry skin, scaling or peeling skin change, swollen/tender/reddened/open area LUE Heme: Denies easy bruising,  bleeding, bleeding gums Neuro: Denies headache, numbness, weakness, slurred speech, loss of memory or consciousness  Past Medical History:  He,  has a past medical history of Coronary artery disease, Family history of lung cancer, HFrEF (heart failure with reduced ejection fraction) ([place]), HLD (hyperlipidemia), HTN (hypertension), ICD  single,BSX, Ischemic cardiomyopathy, Personal history of colonic polyps, Pneumonia, Syncope and collapse, and Ventricular tachycardia (11/2011).   Surgical History:   Past Surgical History:  Procedure Laterality Date   appendectomy     CARDIAC CATHETERIZATION  Nov 30 2011   [facility]   CARDIAC DEFIBRILLATOR PLACEMENT  May 2013   COLONOSCOPY WITH PROPOFOL N/A 12/22/2020   Procedure: COLONOSCOPY WITH PROPOFOL;  Surgeon: Jonathon Bellows, MD;  Location: [facility] ENDOSCOPY;  Service: Gastroenterology;  Laterality: N/A;   CORONARY ANGIOPLASTY  2014   x 2 stents   CORONARY STENT PLACEMENT     x7   LEFT HEART CATH AND CORONARY ANGIOGRAPHY Left 08/30/2019   Procedure: LEFT HEART CATH AND CORONARY ANGIOGRAPHY;  Surgeon: Wellington Hampshire, MD;  Location: [facility];  Service: Cardiovascular;  Laterality: Left;   PROSTATE SURGERY     urethra grew around prostate    TUMOR REMOVAL  2001   chest thymic cyst; behind lungs [facility] benign      Social History:   reports that he quit smoking about 20 months ago. His smoking use included cigarettes. He has a 21.00 pack-year smoking history. He has never used smokeless tobacco. He reports current alcohol use of about 8.0 standard drinks per week. He reports that he does not use drugs.   Family History:  His family history includes Cancer in his father; Cancer - Lung in his mother; Dementia in his maternal grandmother; Heart attack in his mother and paternal grandfather; Hypertension in his mother. There is no history of Colon cancer, Stomach cancer, or Pancreatic cancer.   Allergies Allergies  Allergen Reactions   Nitroglycerin  Nausea Only and Other (See Comments)    Per patient "causes severe  headache"     Home Medications  Prior to Admission medications   Medication Sig Start Date End Date Taking? Authorizing Provider  albuterol (VENTOLIN HFA) 108 (90 Base) MCG/ACT inhaler Inhale 1 puff into the lungs every 6 (six) hours as needed for wheezing or shortness of breath.    [provider]  ALPRAZolam Duanne Moron) 0.5 MG tablet Take 1 tablet (0.5 mg total) by mouth 3 (three) times daily as needed. for anxiety 06/26/21   McLean-Scocuzza, Nino Glow, MD  amiodarone (PACERONE) 200 MG tablet Take 1 tablet (200 mg total) by  mouth daily. 06/11/21   Deboraha Sprang, MD  aspirin 81 MG EC tablet Take 1 tablet (81 mg total) by mouth daily. 09/11/15   Minna Merritts, MD  atorvastatin (LIPITOR) 80 MG tablet Take 1 tablet (80 mg total) by mouth daily. 02/14/21   McLean-Scocuzza, Nino Glow, MD  carvedilol (COREG) 3.125 MG tablet Take 1 tablet (3.125 mg total) by mouth 2 (two) times daily. 05/02/21   Minna Merritts, MD  clindamycin (CLEOCIN) 300 MG capsule Take 300 mg by mouth 4 (four) times daily. 06/13/21   [provider]  clopidogrel (PLAVIX) 75 MG tablet Take 1 tablet (75 mg total) by mouth daily. 06/26/21   McLean-Scocuzza, Nino Glow, MD  ezetimibe (ZETIA) 10 MG tablet Take 1 tablet (10 mg total) by mouth daily. 06/26/21   McLean-Scocuzza, Nino Glow, MD  furosemide (LASIX) 40 MG tablet Take 1 tablet (40 mg total) by mouth 2 (two) times daily. 06/26/21   McLean-Scocuzza, Nino Glow, MD  HYDROcodone-acetaminophen (NORCO) 5-325 MG tablet Take 1 tablet by mouth 2 (two) times daily as needed for moderate pain. 06/26/21   McLean-Scocuzza, Nino Glow, MD  ipratropium-albuterol (DUONEB) 0.5-2.5 (3) MG/3ML SOLN Take 3 mLs by nebulization every 6 (six) hours as needed. 08/08/20   McLean-Scocuzza, Nino Glow, MD  mupirocin ointment (BACTROBAN) 2 % Apply 1 application topically 2 (two) times daily. 06/26/21   McLean-Scocuzza, Nino Glow, MD   potassium chloride (KLOR-CON 10) 10 MEQ tablet Take 1 tablet (10 mEq total) by mouth daily. 06/26/21   McLean-Scocuzza, Nino Glow, MD  sacubitril-valsartan (ENTRESTO) 97-103 MG Take 1 tablet by mouth 2 (two) times daily. 06/29/21   Minna Merritts, MD  spironolactone (ALDACTONE) 25 MG tablet Take 1 tablet (25 mg total) by mouth daily. 07/24/21 10/22/21  Minna Merritts, MD  sulfamethoxazole-trimethoprim (BACTRIM DS) 800-160 MG tablet Take 1 tablet by mouth 2 (two) times daily. With food 06/26/21   McLean-Scocuzza, Nino Glow, MD  tadalafil (CIALIS) 10 MG tablet Take 1-2 tablets (10-20 mg total) by mouth daily as needed for erectile dysfunction. 06/29/20   Billey Co, MD  Tiotropium Bromide-Olodaterol (STIOLTO RESPIMAT) 2.5-2.5 MCG/ACT AERS Inhale 2 puffs into the lungs daily. 08/12/19   Tyler Pita, MD     Critical care provider statement:   Total critical care time: 33 minutes   Performed by: Lanney Gins MD   Critical care time was exclusive of separately billable procedures and treating other patients.   Critical care was necessary to treat or prevent imminent or life-threatening deterioration.   Critical care was time spent personally by me on the following activities: development of treatment plan with patient and/or surrogate as well as nursing, discussions with consultants, evaluation of patient's response to treatment, examination of patient, obtaining history from patient or surrogate, ordering and performing treatments and interventions, ordering and review of laboratory studies, ordering and review of radiographic studies, pulse oximetry and re-evaluation of patient's condition.    Ottie Glazier, M.D.  Pulmonary & Critical Care Medicine

## 2021-07-30 NOTE — Progress Notes (Signed)
PHARMACY - PHYSICIAN COMMUNICATION CRITICAL VALUE ALERT - BLOOD CULTURE IDENTIFICATION (BCID)  Ian Mills is an 66 y.o. male who presented to [facility] on 07/29/2021 with a chief complaint of  Chest pain, dizziness, and low BP.  Patient fell several days prior to admission  Assessment:  1/1 Blood cx with GPC current in 3/4 bottles, BCID = Group A Streptococcus (aka S pyogenes).  His arm appears to be most likely source  Name of physician (or Provider) Contacted: Dr Lanney Gins  Current antibiotics: vancomycin and cefepime  Changes to prescribed antibiotics recommended:  Recommendations accepted by provider  Results for orders placed or performed during the hospital encounter of 07/29/21  Blood Culture ID Panel (Reflexed) (Collected: 07/29/2021  8:17 PM)  Result Value Ref Range   Enterococcus faecalis NOT DETECTED NOT DETECTED   Enterococcus Faecium NOT DETECTED NOT DETECTED   Listeria monocytogenes NOT DETECTED NOT DETECTED   Staphylococcus species NOT DETECTED NOT DETECTED   Staphylococcus aureus (BCID) NOT DETECTED NOT DETECTED   Staphylococcus epidermidis NOT DETECTED NOT DETECTED   Staphylococcus lugdunensis NOT DETECTED NOT DETECTED   Streptococcus species DETECTED (A) NOT DETECTED   Streptococcus agalactiae NOT DETECTED NOT DETECTED   Streptococcus pneumoniae NOT DETECTED NOT DETECTED   Streptococcus pyogenes DETECTED (A) NOT DETECTED   A.calcoaceticus-baumannii NOT DETECTED NOT DETECTED   Bacteroides fragilis NOT DETECTED NOT DETECTED   Enterobacterales NOT DETECTED NOT DETECTED   Enterobacter cloacae complex NOT DETECTED NOT DETECTED   Escherichia coli NOT DETECTED NOT DETECTED   Klebsiella aerogenes NOT DETECTED NOT DETECTED   Klebsiella oxytoca NOT DETECTED NOT DETECTED   Klebsiella pneumoniae NOT DETECTED NOT DETECTED   Proteus species NOT DETECTED NOT DETECTED   Salmonella species NOT DETECTED NOT DETECTED   Serratia marcescens NOT DETECTED NOT DETECTED    Haemophilus influenzae NOT DETECTED NOT DETECTED   Neisseria meningitidis NOT DETECTED NOT DETECTED   Pseudomonas aeruginosa NOT DETECTED NOT DETECTED   Stenotrophomonas maltophilia NOT DETECTED NOT DETECTED   Candida albicans NOT DETECTED NOT DETECTED   Candida auris NOT DETECTED NOT DETECTED   Candida glabrata NOT DETECTED NOT DETECTED   Candida krusei NOT DETECTED NOT DETECTED   Candida parapsilosis NOT DETECTED NOT DETECTED   Candida tropicalis NOT DETECTED NOT DETECTED   Cryptococcus neoformans/gattii NOT DETECTED NOT DETECTED   Doreene Eland, PharmD, BCPS, BCIDP Work Cell: [phone] 07/30/2021 10:17 AM

## 2021-07-31 ENCOUNTER — Telehealth: Payer: Self-pay | Admitting: Cardiovascular Disease

## 2021-07-31 LAB — COMPREHENSIVE METABOLIC PANEL
ALT: 89 U/L — ABNORMAL HIGH (ref 0–44)
AST: 59 U/L — ABNORMAL HIGH (ref 15–41)
Albumin: 2.5 g/dL — ABNORMAL LOW (ref 3.5–5.0)
Alkaline Phosphatase: 82 U/L (ref 38–126)
Anion gap: 6 (ref 5–15)
BUN: 33 mg/dL — ABNORMAL HIGH (ref 8–23)
CO2: 24 mmol/L (ref 22–32)
Calcium: 7.7 mg/dL — ABNORMAL LOW (ref 8.9–10.3)
Chloride: 104 mmol/L (ref 98–111)
Creatinine, Ser: 1.88 mg/dL — ABNORMAL HIGH (ref 0.61–1.24)
GFR, Estimated: 39 mL/min — ABNORMAL LOW (ref >60)
Glucose, Bld: 117 mg/dL — ABNORMAL HIGH (ref 70–99)
Potassium: 4.1 mmol/L (ref 3.5–5.1)
Sodium: 134 mmol/L — ABNORMAL LOW (ref 135–145)
Total Bilirubin: 1 mg/dL (ref 0.3–1.2)
Total Protein: 5.6 g/dL — ABNORMAL LOW (ref 6.5–8.1)

## 2021-07-31 LAB — CBC
HCT: 37 % — ABNORMAL LOW (ref 39.0–52.0)
Hemoglobin: 12.3 g/dL — ABNORMAL LOW (ref 13.0–17.0)
MCH: 29.9 pg (ref 26.0–34.0)
MCHC: 33.2 g/dL (ref 30.0–36.0)
MCV: 90 fL (ref 80.0–100.0)
Platelets: 93 K/uL — ABNORMAL LOW (ref 150–400)
RBC: 4.11 MIL/uL — ABNORMAL LOW (ref 4.22–5.81)
RDW: 14.6 % (ref 11.5–15.5)
WBC: 15.8 K/uL — ABNORMAL HIGH (ref 4.0–10.5)
nRBC: 0 % (ref 0.0–0.2)

## 2021-07-31 LAB — AEROBIC CULTURE W GRAM STAIN (SUPERFICIAL SPECIMEN)

## 2021-07-31 LAB — PHOSPHORUS: Phosphorus: 3.3 mg/dL (ref 2.5–4.6)

## 2021-07-31 LAB — PROCALCITONIN: Procalcitonin: 5.47 ng/mL

## 2021-07-31 LAB — MAGNESIUM: Magnesium: 2.4 mg/dL (ref 1.7–2.4)

## 2021-07-31 MED ORDER — DIPHENHYDRAMINE HCL 50 MG/ML IJ SOLN
25.0000 mg | Freq: Once | INTRAMUSCULAR | Status: AC
  Administered 2021-07-31: 25 mg via INTRAVENOUS
  Filled 2021-07-31: qty 1

## 2021-07-31 MED ORDER — LACTATED RINGERS IV SOLN: INTRAVENOUS | Status: DC

## 2021-07-31 MED ORDER — HYDROMORPHONE HCL 1 MG/ML IJ SOLN
1.0000 mg | INTRAMUSCULAR | Status: DC | PRN
  Administered 2021-07-31 – 2021-08-02 (×6): 1 mg via INTRAVENOUS
  Filled 2021-07-31 (×6): qty 1

## 2021-07-31 NOTE — Telephone Encounter (Signed)
Attempted to reach pt via phone, unable to make contact  LMTCB regarding medication concern  LDM advising on medication   Coreg medication used to treat high blood pressure, congestive heart failure, and left ventricular dysfunction  Plavix is an antiplatelet medication used to reduce the risk of heart disease and stroke in those at high risk. It is also used together with aspirin in heart attacks and following the placement of a coronary artery stent.

## 2021-07-31 NOTE — Telephone Encounter (Signed)
Patient is returning your call.  

## 2021-07-31 NOTE — Telephone Encounter (Signed)
Return Ian Mills's call, reintegrated Coreg and Plavix as left on VM prior to return call. Ian Mills verbalized understanding and thankful or return call.

## 2021-07-31 NOTE — Progress Notes (Signed)
NAME:  Ian Mills, MRN:  [id number], DOB:  1955-12-29, LOS: 2 ADMISSION DATE:  07/29/2021, CONSULTATION DATE:  07/29/2021 REFERRING MD:  Dr. Jacqualine Code, CHIEF COMPLAINT:  chest pain, SOB, weakness   History of Present Illness:  66 yo M presenting to [facility] ED from home with complaints of chest tightness, shortness of breath & weakness/dizziness with hypotension. Patient reports feeling weak and falling a few days ago scraping his arm and hitting his head. The left arm is sore, swollen and draining. He also complains of low energy and checked his BP today which was low in the 70's. The hypotension and concern for infection is what prompted him to come to the ED. Of note patient has had multiple superficial boils requiring lancing & a "skewer stick" lodged in his L foot that podiatry is following outpatient. Upon physical exam below however the left arm wound is the only open/reddened/tender area.  07/30/21- patient is slighly improved, s/p surgery evaluation for abcess/cellulitis of upper extermity 07/31/21- patient s/p surgery evaluation , he has severe pain requesting stronger narcotics. He is on 4 of levophed with improvement plan to stop today.   Pertinent  Medical History  CAD s/p PCI HFrEF (echo: 06/2021- LVEF 25-30%, akinesis of LV apical segment, G1DD) HTN HLD ICM VTach s/p ICD placement Former smoker 21 pack year history (quit 10/2019) Significant Hospital Events: Including procedures, antibiotic start and stop dates in addition to other pertinent events   07/29/21: Admit to ICU with septic shock s/t suspected cellulitis requiring vasopressor support   Objective   Blood pressure (!) 108/56, pulse 78, temperature 98.4 F (36.9 C), temperature source Axillary, resp. rate (!) 22, height 5\' 9"  (1.753 m), weight 92.7 kg, SpO2 93 %.        Intake/Output Summary (Last 24 hours) at 07/31/2021 1730 Last data filed at 07/31/2021 1500 Gross per 24 hour  Intake 815.81 ml  Output 525 ml  Net 290.81 ml     Filed Weights   07/29/21 1830 07/30/21 0510 07/31/21 0500  Weight: 88.5 kg 93.1 kg 92.7 kg    Examination: General: Adult male, acutely ill, lying in bed, NAD HEENT: MM pink/moist, anicteric, atraumatic, neck supple Neuro: A&O x 4, able to follow commands, PERRL +3, MAE CV: s1s2 RRR, NSR on monitor, no r/m/g Pulm: Regular, non labored on RA, breath sounds clear throughout GI: soft, rounded, non tender, bs x 4 Skin: LUE as below, no other open/reddened areas    Extremities: warm/dry, pulses + 2 R/P, +1 edema noted in LUE only  Resolved Hospital Problem list     Assessment & Plan:  Sepsis with septic shock due to suspected LUE cellulitis Lactic: 3.2, Baseline PCT: pending, UA: pending, CXR: no acute process  Initial interventions/workup included: 2.5 L of NS/LR & Cefepime/ Vancomycin - Supplemental oxygen as needed, to maintain SpO2 > 90% - f/u cultures, trend lactic/ PCT - Daily CBC, monitor WBC/ fever curve - IV antibiotics: cefepime & vancomycin  - cautious IVF hydration as LVEF is 25-30% - Continue vasopressors to maintain MAP< 65, norepinephrine - Strict I/O's: alert provider if UOP < 0.5 mL/kg/hr - f/u cortisol level > persistent hypotension consider stress dose steroids -surg consult  Mildly Elevated Troponin secondary suspected demand ischemia HFrEF without acute exacerbation ICM with ICD in place HLD CAD s/p PCI PMHx: HTN, VTach Troponin: 34 > 31, echo: 12/22 LVEF 25-30%akinesis of LV apical segment, G1DD - continue outpatient: ASA, plavix, amiodarone - continuous cardiac monitoring - cautious IVF hydration -  outpatient regimen on hold while patient requiring vasopressor support, consider restarting as patient stabilizes: entresto, coreg, lasix  Acute Kidney Injury superimposed on CKD Stage 3a secondary to shock in the setting of sepsis NAGMA Baseline Cr: 1.3 (average, most recent 1.24- but 1.33-1.55 over the last year) Cr on admission: 2.61 - Strict  I/O's: alert provider if UOP < 0.5 mL/kg/hr - gentle IVF hydration  - Daily BMP, replace electrolytes PRN - Avoid nephrotoxic agents as able, ensure adequate renal perfusion  Mild Transaminitis secondary to shock in the setting of sepsis AST: 104, ALT: 90 - Trend hepatic function - Consider RUQ [place] if continues to uptrend - avoid hepatotoxic agents - outpatient Lipitor & Zetia on hold until hepatic function normalizes  Former Smoker - bronchodilators PRN  Anxiety - continue outpatient Xanax TID PRN Best Practice (right click and "Reselect all SmartList Selections" daily)  Diet/type: Regular consistency (see orders) DVT prophylaxis: prophylactic heparin  GI prophylaxis: N/A Lines: N/A Foley:  N/A Code Status:  full code Last date of multidisciplinary goals of care discussion [07/29/21]  Labs   CBC: Recent Labs  Lab 07/29/21 1830 07/30/21 0527 07/31/21 0520  WBC 17.4* 20.3* 15.8*  NEUTROABS 16.2*  --   --   HGB 15.5 13.6 12.3*  HCT 46.9 41.5 37.0*  MCV 90.4 91.0 90.0  PLT 142* 122* 93*     Basic Metabolic Panel: Recent Labs  Lab 07/29/21 1830 07/29/21 2026 07/30/21 0527 07/31/21 0520  NA 131*  --  133* 134*  K 4.1  --  4.1 4.1  CL 99  --  101 104  CO2 19*  --  20* 24  GLUCOSE 68*  --  93 117*  BUN 37*  --  40* 33*  CREATININE 2.61*  --  2.48* 1.88*  CALCIUM 8.9  --  8.0* 7.7*  MG  --  1.5* 2.6* 2.4  PHOS  --  3.4 3.1 3.3    GFR: Estimated Creatinine Clearance: 42.9 mL/min (A) (by C-G formula based on SCr of 1.88 mg/dL (H)). Recent Labs  Lab 07/29/21 1830 07/29/21 2026 07/30/21 0029 07/30/21 0527 07/31/21 0520  PROCALCITON  --  27.38  --  18.93 5.47  WBC 17.4*  --   --  20.3* 15.8*  LATICACIDVEN  --  3.2* 2.8*  --   --      Liver Function Tests: Recent Labs  Lab 07/29/21 1830 07/30/21 0527 07/31/21 0520  AST 104* 105* 59*  ALT 90* 106* 89*  ALKPHOS 103 94 82  BILITOT 1.5* 1.6* 1.0  PROT 7.6 6.3* 5.6*  ALBUMIN 3.8 3.0* 2.5*    No  results for input(s): LIPASE, AMYLASE in the last 168 hours. No results for input(s): AMMONIA in the last 168 hours.  ABG No results found for: PHART, PCO2ART, PO2ART, HCO3, TCO2, ACIDBASEDEF, O2SAT   Coagulation Profile: No results for input(s): INR, PROTIME in the last 168 hours.  Cardiac Enzymes: No results for input(s): CKTOTAL, CKMB, CKMBINDEX, TROPONINI in the last 168 hours.  HbA1C: Hgb A1c MFr Bld  Date/Time Value Ref Range Status  04/18/2021 10:38 AM 5.6 4.6 - 6.5 % Final    Comment:    Glycemic Control Guidelines for People with Diabetes:Non Diabetic:  <6%Goal of Therapy: <7%Additional Action Suggested:  >8%   03/03/2020 02:17 PM 5.4 <5.7 % of total Hgb Final    Comment:    For the purpose of screening for the presence of diabetes: . <5.7%       Consistent  with the absence of diabetes 5.7-6.4%    Consistent with increased risk for diabetes             (prediabetes) > or =6.5%  Consistent with diabetes . This assay result is consistent with a decreased risk of diabetes. . Currently, no consensus exists regarding use of hemoglobin A1c for diagnosis of diabetes in children. . According to American Diabetes Association (ADA) guidelines, hemoglobin A1c <7.0% represents optimal control in non-pregnant diabetic patients. Different metrics may apply to specific patient populations.  Standards of Medical Care in Diabetes(ADA). .     CBG: Recent Labs  Lab 07/29/21 2319 07/30/21 0035 07/30/21 0510  GLUCAP 69* 97 88     Review of Systems: Positives in BOLD  Gen: Denies fever, chills, weight change, fatigue, night sweats HEENT: Denies blurred vision, double vision, hearing loss, tinnitus, sinus congestion, rhinorrhea, sore throat, neck stiffness, dysphagia PULM: Denies shortness of breath, cough, sputum production, hemoptysis, wheezing CV: Denies chest tightness, edema, orthopnea, paroxysmal nocturnal dyspnea, palpitations GI: Denies abdominal pain, nausea,  vomiting, diarrhea, hematochezia, melena, constipation, change in bowel habits GU: Denies dysuria, hematuria, polyuria, oliguria, urethral discharge Endocrine: Denies hot or cold intolerance, polyuria, polyphagia or appetite change Derm: Denies rash, dry skin, scaling or peeling skin change, swollen/tender/reddened/open area LUE Heme: Denies easy bruising, bleeding, bleeding gums Neuro: Denies headache, numbness, weakness, slurred speech, loss of memory or consciousness  Past Medical History:  He,  has a past medical history of Coronary artery disease, Family history of lung cancer, HFrEF (heart failure with reduced ejection fraction) ([place]), HLD (hyperlipidemia), HTN (hypertension), ICD  single,BSX, Ischemic cardiomyopathy, Personal history of colonic polyps, Pneumonia, Syncope and collapse, and Ventricular tachycardia (11/2011).   Surgical History:   Past Surgical History:  Procedure Laterality Date   appendectomy     CARDIAC CATHETERIZATION  Nov 30 2011   [facility]   CARDIAC DEFIBRILLATOR PLACEMENT  May 2013   COLONOSCOPY WITH PROPOFOL N/A 12/22/2020   Procedure: COLONOSCOPY WITH PROPOFOL;  Surgeon: Jonathon Bellows, MD;  Location: [facility] ENDOSCOPY;  Service: Gastroenterology;  Laterality: N/A;   CORONARY ANGIOPLASTY  2014   x 2 stents   CORONARY STENT PLACEMENT     x7   LEFT HEART CATH AND CORONARY ANGIOGRAPHY Left 08/30/2019   Procedure: LEFT HEART CATH AND CORONARY ANGIOGRAPHY;  Surgeon: Wellington Hampshire, MD;  Location: [facility];  Service: Cardiovascular;  Laterality: Left;   PROSTATE SURGERY     urethra grew around prostate    TUMOR REMOVAL  2001   chest thymic cyst; behind lungs [facility] benign      Social History:   reports that he quit smoking about 20 months ago. His smoking use included cigarettes. He has a 21.00 pack-year smoking history. He has never used smokeless tobacco. He reports current alcohol use of about 8.0 standard drinks per week. He reports that he does not use  drugs.   Family History:  His family history includes Cancer in his father; Cancer - Lung in his mother; Dementia in his maternal grandmother; Heart attack in his mother and paternal grandfather; Hypertension in his mother. There is no history of Colon cancer, Stomach cancer, or Pancreatic cancer.   Allergies Allergies  Allergen Reactions   Nitroglycerin Nausea Only and Other (See Comments)    Per patient "causes severe  headache"     Home Medications  Prior to Admission medications   Medication Sig Start Date End Date Taking? Authorizing Provider  albuterol (VENTOLIN HFA) 108 (90  Base) MCG/ACT inhaler Inhale 1 puff into the lungs every 6 (six) hours as needed for wheezing or shortness of breath.    [provider]  ALPRAZolam Duanne Moron) 0.5 MG tablet Take 1 tablet (0.5 mg total) by mouth 3 (three) times daily as needed. for anxiety 06/26/21   McLean-Scocuzza, Nino Glow, MD  amiodarone (PACERONE) 200 MG tablet Take 1 tablet (200 mg total) by mouth daily. 06/11/21   Deboraha Sprang, MD  aspirin 81 MG EC tablet Take 1 tablet (81 mg total) by mouth daily. 09/11/15   Minna Merritts, MD  atorvastatin (LIPITOR) 80 MG tablet Take 1 tablet (80 mg total) by mouth daily. 02/14/21   McLean-Scocuzza, Nino Glow, MD  carvedilol (COREG) 3.125 MG tablet Take 1 tablet (3.125 mg total) by mouth 2 (two) times daily. 05/02/21   Minna Merritts, MD  clindamycin (CLEOCIN) 300 MG capsule Take 300 mg by mouth 4 (four) times daily. 06/13/21   [provider]  clopidogrel (PLAVIX) 75 MG tablet Take 1 tablet (75 mg total) by mouth daily. 06/26/21   McLean-Scocuzza, Nino Glow, MD  ezetimibe (ZETIA) 10 MG tablet Take 1 tablet (10 mg total) by mouth daily. 06/26/21   McLean-Scocuzza, Nino Glow, MD  furosemide (LASIX) 40 MG tablet Take 1 tablet (40 mg total) by mouth 2 (two) times daily. 06/26/21   McLean-Scocuzza, Nino Glow, MD  HYDROcodone-acetaminophen (NORCO) 5-325 MG tablet Take 1 tablet by mouth 2 (two) times  daily as needed for moderate pain. 06/26/21   McLean-Scocuzza, Nino Glow, MD  ipratropium-albuterol (DUONEB) 0.5-2.5 (3) MG/3ML SOLN Take 3 mLs by nebulization every 6 (six) hours as needed. 08/08/20   McLean-Scocuzza, Nino Glow, MD  mupirocin ointment (BACTROBAN) 2 % Apply 1 application topically 2 (two) times daily. 06/26/21   McLean-Scocuzza, Nino Glow, MD  potassium chloride (KLOR-CON 10) 10 MEQ tablet Take 1 tablet (10 mEq total) by mouth daily. 06/26/21   McLean-Scocuzza, Nino Glow, MD  sacubitril-valsartan (ENTRESTO) 97-103 MG Take 1 tablet by mouth 2 (two) times daily. 06/29/21   Minna Merritts, MD  spironolactone (ALDACTONE) 25 MG tablet Take 1 tablet (25 mg total) by mouth daily. 07/24/21 10/22/21  Minna Merritts, MD  sulfamethoxazole-trimethoprim (BACTRIM DS) 800-160 MG tablet Take 1 tablet by mouth 2 (two) times daily. With food 06/26/21   McLean-Scocuzza, Nino Glow, MD  tadalafil (CIALIS) 10 MG tablet Take 1-2 tablets (10-20 mg total) by mouth daily as needed for erectile dysfunction. 06/29/20   Billey Co, MD  Tiotropium Bromide-Olodaterol (STIOLTO RESPIMAT) 2.5-2.5 MCG/ACT AERS Inhale 2 puffs into the lungs daily. 08/12/19   Tyler Pita, MD     Critical care provider statement:   Total critical care time: 33 minutes   Performed by: Lanney Gins MD   Critical care time was exclusive of separately billable procedures and treating other patients.   Critical care was necessary to treat or prevent imminent or life-threatening deterioration.   Critical care was time spent personally by me on the following activities: development of treatment plan with patient and/or surrogate as well as nursing, discussions with consultants, evaluation of patient's response to treatment, examination of patient, obtaining history from patient or surrogate, ordering and performing treatments and interventions, ordering and review of laboratory studies, ordering and review of radiographic studies, pulse  oximetry and re-evaluation of patient's condition.    Ottie Glazier, M.D.  Pulmonary & Critical Care Medicine

## 2021-07-31 NOTE — Consult Note (Signed)
PHARMACY CONSULT NOTE  Pharmacy Consult for Electrolyte Monitoring and Replacement   Recent Labs: Potassium (mmol/L)  Date Value  07/31/2021 4.1  05/30/2014 3.9   Magnesium (mg/dL)  Date Value  07/31/2021 2.4  06/28/2013 1.9   Calcium (mg/dL)  Date Value  07/31/2021 7.7 (L)   Calcium, Total (mg/dL)  Date Value  05/30/2014 8.1 (L)   Albumin (g/dL)  Date Value  07/31/2021 2.5 (L)  04/06/2021 3.8  05/29/2014 3.6   Phosphorus (mg/dL)  Date Value  07/31/2021 3.3   Sodium (mmol/L)  Date Value  07/31/2021 134 (L)  04/06/2021 139  05/30/2014 137   Assessment: Patient is a 66 y/o M with medical history including CAD, HFrEF, HTN, HLD, Vtach s/p ICD, history of tobacco abuse who is admitted with septic shock secondary to left forearm cellulitis complicated by GAS bacteremia. Pharmacy consulted to assist with electrolyte monitoring and replacement as indicated.  Goal of Therapy:  Electrolytes within normal limits  Plan:  --No electrolyte replacement indicated at this time --Follow-up electrolytes with AM labs tomorrow  Benita Gutter 07/31/2021 7:54 AM

## 2021-07-31 NOTE — Telephone Encounter (Signed)
Please call to discuss his medicaitons. He states he is not sure he has taken his Plavix lately. He was confused if his Coreg and Plavix were the same medication.

## 2021-08-01 DIAGNOSIS — A491 Streptococcal infection, unspecified site: Secondary | ICD-10-CM

## 2021-08-01 DIAGNOSIS — L03114 Cellulitis of left upper limb: Secondary | ICD-10-CM | POA: Diagnosis not present

## 2021-08-01 DIAGNOSIS — A419 Sepsis, unspecified organism: Secondary | ICD-10-CM

## 2021-08-01 DIAGNOSIS — R6521 Severe sepsis with septic shock: Secondary | ICD-10-CM

## 2021-08-01 DIAGNOSIS — R7881 Bacteremia: Secondary | ICD-10-CM | POA: Diagnosis not present

## 2021-08-01 LAB — CBC
HCT: 36.7 % — ABNORMAL LOW (ref 39.0–52.0)
Hemoglobin: 11.9 g/dL — ABNORMAL LOW (ref 13.0–17.0)
MCH: 29.8 pg (ref 26.0–34.0)
MCHC: 32.4 g/dL (ref 30.0–36.0)
MCV: 91.8 fL (ref 80.0–100.0)
Platelets: 76 K/uL — ABNORMAL LOW (ref 150–400)
RBC: 4 MIL/uL — ABNORMAL LOW (ref 4.22–5.81)
RDW: 14.6 % (ref 11.5–15.5)
WBC: 12.6 K/uL — ABNORMAL HIGH (ref 4.0–10.5)
nRBC: 0 % (ref 0.0–0.2)

## 2021-08-01 LAB — COMPREHENSIVE METABOLIC PANEL
ALT: 41 U/L (ref 0–44)
AST: 21 U/L (ref 15–41)
Albumin: 2.3 g/dL — ABNORMAL LOW (ref 3.5–5.0)
Alkaline Phosphatase: 69 U/L (ref 38–126)
Anion gap: 5 (ref 5–15)
BUN: 24 mg/dL — ABNORMAL HIGH (ref 8–23)
CO2: 25 mmol/L (ref 22–32)
Calcium: 8.1 mg/dL — ABNORMAL LOW (ref 8.9–10.3)
Chloride: 101 mmol/L (ref 98–111)
Creatinine, Ser: 1.26 mg/dL — ABNORMAL HIGH (ref 0.61–1.24)
GFR, Estimated: >60 mL/min (ref >60)
Glucose, Bld: 112 mg/dL — ABNORMAL HIGH (ref 70–99)
Potassium: 4 mmol/L (ref 3.5–5.1)
Sodium: 131 mmol/L — ABNORMAL LOW (ref 135–145)
Total Bilirubin: 0.7 mg/dL (ref 0.3–1.2)
Total Protein: 5.6 g/dL — ABNORMAL LOW (ref 6.5–8.1)

## 2021-08-01 LAB — CULTURE, BLOOD (ROUTINE X 2)

## 2021-08-01 MED ORDER — HYDROCORTISONE 1 % EX CREA
1.0000 "application " | TOPICAL_CREAM | Freq: Three times a day (TID) | CUTANEOUS | Status: DC | PRN
  Filled 2021-08-01: qty 28

## 2021-08-01 MED ORDER — LORATADINE 10 MG PO TABS: 10.0000 mg | ORAL_TABLET | Freq: Every day | ORAL | Status: DC | PRN

## 2021-08-01 MED ORDER — POLYVINYL ALCOHOL 1.4 % OP SOLN
1.0000 [drp] | OPHTHALMIC | Status: DC | PRN
  Filled 2021-08-01: qty 15

## 2021-08-01 MED ORDER — HYDROCORTISONE (PERIANAL) 2.5 % EX CREA
1.0000 "application " | TOPICAL_CREAM | Freq: Four times a day (QID) | CUTANEOUS | Status: DC | PRN
  Filled 2021-08-01: qty 28.35

## 2021-08-01 MED ORDER — PHENOL 1.4 % MT LIQD
1.0000 | OROMUCOSAL | Status: DC | PRN
  Filled 2021-08-01: qty 177

## 2021-08-01 MED ORDER — GUAIFENESIN-DM 100-10 MG/5ML PO SYRP
5.0000 mL | ORAL_SOLUTION | ORAL | Status: DC | PRN
  Administered 2021-08-01 – 2021-08-04 (×6): 5 mL via ORAL
  Filled 2021-08-01 (×6): qty 5

## 2021-08-01 MED ORDER — ALUM & MAG HYDROXIDE-SIMETH 200-200-20 MG/5ML PO SUSP: 30.0000 mL | ORAL | Status: DC | PRN

## 2021-08-01 MED ORDER — MUSCLE RUB 10-15 % EX CREA
1.0000 "application " | TOPICAL_CREAM | CUTANEOUS | Status: DC | PRN
  Filled 2021-08-01: qty 85

## 2021-08-01 MED ORDER — BLISTEX MEDICATED EX OINT
1.0000 "application " | TOPICAL_OINTMENT | CUTANEOUS | Status: DC | PRN
  Filled 2021-08-01: qty 6.3

## 2021-08-01 MED ORDER — ENOXAPARIN SODIUM 40 MG/0.4ML IJ SOSY
40.0000 mg | PREFILLED_SYRINGE | Freq: Every day | INTRAMUSCULAR | Status: DC
  Administered 2021-08-01 – 2021-08-06 (×6): 40 mg via SUBCUTANEOUS
  Filled 2021-08-01 (×6): qty 0.4

## 2021-08-01 MED ORDER — SALINE SPRAY 0.65 % NA SOLN
1.0000 | NASAL | Status: DC | PRN
  Filled 2021-08-01: qty 44

## 2021-08-01 NOTE — Progress Notes (Signed)
Subjective:  CC: Ian Mills is a 66 y.o. male  Hospital stay day 3,   left fore arm cellulitis  HPI: Increased swelling today.  ROS:  General: Denies weight loss, weight gain, fatigue, fevers, chills, and night sweats. Heart: Denies chest pain, palpitations, racing heart, irregular heartbeat, leg pain or swelling, and decreased activity tolerance. Respiratory: Denies breathing difficulty, shortness of breath, wheezing, cough, and sputum. GI: Denies change in appetite, heartburn, nausea, vomiting, constipation, diarrhea, and blood in stool. GU: Denies difficulty urinating, pain with urinating, urgency, frequency, blood in urine.   Objective:   Temp:  [98.4 F (36.9 C)-100 F (37.8 C)] 98.9 F (37.2 C) (01/04 0900) Pulse Rate:  [69-79] 70 (01/04 1000) Resp:  [17-31] 24 (01/04 1000) BP: (90-119)/(50-66) 100/58 (01/04 1000) SpO2:  [90 %-99 %] 95 % (01/04 1000) Weight:  [92 kg] 92 kg (01/04 0500)     Height: 5\' 9"  (175.3 cm) Weight: 92 kg BMI (Calculated): 29.94   Intake/Output this shift:   Intake/Output Summary (Last 24 hours) at 08/01/2021 1022 Last data filed at 08/01/2021 0400 Gross per 24 hour  Intake 621.74 ml  Output 1000 ml  Net -378.26 ml    Constitutional :  alert, cooperative, appears stated age, and no distress  Respiratory:  clear to auscultation bilaterally  Cardiovascular:  regular rate and rhythm  Gastrointestinal: soft, non-tender; bowel sounds normal; no masses,  no organomegaly.   Skin: Cool and moist. Left forearm with increased swelling, erythema extending beyond elbow now.  Blistering still persistent with serous drainage.  Neurovascular intact, TTP but still no sign of fluctuance.  Psychiatric: Normal affect, non-agitated, not confused       LABS:  CMP Latest Ref Rng & Units 08/01/2021 07/31/2021 07/30/2021  Glucose 70 - 99 mg/dL 112(H) 117(H) 93  BUN 8 - 23 mg/dL 24(H) 33(H) 40(H)  Creatinine 0.61 - 1.24 mg/dL 1.26(H) 1.88(H) 2.48(H)  Sodium 135 - 145  mmol/L 131(L) 134(L) 133(L)  Potassium 3.5 - 5.1 mmol/L 4.0 4.1 4.1  Chloride 98 - 111 mmol/L 101 104 101  CO2 22 - 32 mmol/L 25 24 20(L)  Calcium 8.9 - 10.3 mg/dL 8.1(L) 7.7(L) 8.0(L)  Total Protein 6.5 - 8.1 g/dL 5.6(L) 5.6(L) 6.3(L)  Total Bilirubin 0.3 - 1.2 mg/dL 0.7 1.0 1.6(H)  Alkaline Phos 38 - 126 U/L 69 82 94  AST 15 - 41 U/L 21 59(H) 105(H)  ALT 0 - 44 U/L 41 89(H) 106(H)   CBC Latest Ref Rng & Units 08/01/2021 07/31/2021 07/30/2021  WBC 4.0 - 10.5 K/uL 12.6(H) 15.8(H) 20.3(H)  Hemoglobin 13.0 - 17.0 g/dL 11.9(L) 12.3(L) 13.6  Hematocrit 39.0 - 52.0 % 36.7(L) 37.0(L) 41.5  Platelets 150 - 400 K/uL 76(L) 93(L) 122(L)    RADS: N/a Assessment:   Left arm cellulitis.  Wbc continues to downtrend, pressor support is off now as well.  Arm itself looks worse again today, but no evidence of drainable abscess.  Hope is the cellulitis will start improving over next day or so as we continue IV abx treatment. May consider repeat imaging in a couple days if arm continues to worsen in appearance or any change in labs.   Continue local wound care and ok to tx to floor if BP remains stable from surgery standpoint.

## 2021-08-01 NOTE — Progress Notes (Signed)
Subjective:  CC: Ian Mills is a 66 y.o. male  Hospital stay day 2,   left arm cellulitis  HPI: No acute issues overnight.  Slightly more increased swelling.  ROS:  General: Denies weight loss, weight gain, fatigue, fevers, chills, and night sweats. Heart: Denies chest pain, palpitations, racing heart, irregular heartbeat, leg pain or swelling, and decreased activity tolerance. Respiratory: Denies breathing difficulty, shortness of breath, wheezing, cough, and sputum. GI: Denies change in appetite, heartburn, nausea, vomiting, constipation, diarrhea, and blood in stool. GU: Denies difficulty urinating, pain with urinating, urgency, frequency, blood in urine.   Objective:   VSS     Height: 5\' 9"  (175.3 cm) Weight: 92 kg BMI (Calculated): 29.94   Intake/Output this shift:  N/a   Constitutional :  alert, cooperative, appears stated age, and no distress  Respiratory:  clear to auscultation bilaterally  Cardiovascular:  regular rate and rhythm  Gastrointestinal: soft, non-tender; bowel sounds normal; no masses,  no organomegaly.   Skin: Cool and moist. Left forearm with increased swelling around cellulliits in forearm, but no crepitance.  Still with serous discharge from blisters.  Full ROM and sensory intact as well.  Psychiatric: Normal affect, non-agitated, not confused       LABS:  CMP Latest Ref Rng & Units 08/01/2021 07/31/2021 07/30/2021  Glucose 70 - 99 mg/dL 112(H) 117(H) 93  BUN 8 - 23 mg/dL 24(H) 33(H) 40(H)  Creatinine 0.61 - 1.24 mg/dL 1.26(H) 1.88(H) 2.48(H)  Sodium 135 - 145 mmol/L 131(L) 134(L) 133(L)  Potassium 3.5 - 5.1 mmol/L 4.0 4.1 4.1  Chloride 98 - 111 mmol/L 101 104 101  CO2 22 - 32 mmol/L 25 24 20(L)  Calcium 8.9 - 10.3 mg/dL 8.1(L) 7.7(L) 8.0(L)  Total Protein 6.5 - 8.1 g/dL 5.6(L) 5.6(L) 6.3(L)  Total Bilirubin 0.3 - 1.2 mg/dL 0.7 1.0 1.6(H)  Alkaline Phos 38 - 126 U/L 69 82 94  AST 15 - 41 U/L 21 59(H) 105(H)  ALT 0 - 44 U/L 41 89(H) 106(H)   CBC  Latest Ref Rng & Units 08/01/2021 07/31/2021 07/30/2021  WBC 4.0 - 10.5 K/uL 12.6(H) 15.8(H) 20.3(H)  Hemoglobin 13.0 - 17.0 g/dL 11.9(L) 12.3(L) 13.6  Hematocrit 39.0 - 52.0 % 36.7(L) 37.0(L) 41.5  Platelets 150 - 400 K/uL 76(L) 93(L) 122(L)    RADS: CLINICAL DATA:  Soft tissue infection suspected. Left forearm infection/abscess.   EXAM: LEFT FOREARM - 2 VIEW   COMPARISON:  None.   FINDINGS: There is no evidence of fracture or other focal bone lesions. Diffuse soft tissue swelling centered around the proximal forearm is noted.   IMPRESSION: 1. No acute bone abnormality. 2. Soft tissue swelling centered around the proximal forearm.     Electronically Signed   By: Kerby Moors M.D.   On: 07/30/2021 11:33   Assessment:   Left arm cellulitis.  Increased swelling noted around the dressing wrap, but Overall no significant change.  Advised arm be wrapped from hand to elbow to minimize swelling from the dressing itself.  Decreasing wbc encouraging.  Continue current management with IV abx, pressor support as needed.  Will continue to monitor.

## 2021-08-01 NOTE — Progress Notes (Signed)
NAME:  Ian Mills, MRN:  [id number], DOB:  Jun 02, 1956, LOS: 3 ADMISSION DATE:  07/29/2021, CONSULTATION DATE:  07/29/2021 REFERRING MD:  Dr. Jacqualine Code, CHIEF COMPLAINT:  chest pain, SOB, weakness   History of Present Illness:  66 yo M presenting to [facility] ED from home with complaints of chest tightness, shortness of breath & weakness/dizziness with hypotension. Patient reports feeling weak and falling a few days ago scraping his arm and hitting his head. The left arm is sore, swollen and draining. He also complains of low energy and checked his BP today which was low in the 70's. The hypotension and concern for infection is what prompted him to come to the ED. Of note patient has had multiple superficial boils requiring lancing & a "skewer stick" lodged in his L foot that podiatry is following outpatient. Upon physical exam below however the left arm wound is the only open/reddened/tender area.  07/30/21- patient is slighly improved, s/p surgery evaluation for abcess/cellulitis of upper extermity 07/31/21- patient s/p surgery evaluation , he has severe pain requesting stronger narcotics. He is on 4 of levophed with improvement plan to stop today.  08/01/20- patient is improved off vasopressors with plan to optimize for TRH transfer. S/p ID evaluation  Pertinent  Medical History  CAD s/p PCI HFrEF (echo: 06/2021- LVEF 25-30%, akinesis of LV apical segment, G1DD) HTN HLD ICM VTach s/p ICD placement Former smoker 21 pack year history (quit 10/2019) Significant Hospital Events: Including procedures, antibiotic start and stop dates in addition to other pertinent events   07/29/21: Admit to ICU with septic shock s/t suspected cellulitis requiring vasopressor support   Objective   Blood pressure 113/66, pulse 74, temperature 98.2 F (36.8 C), temperature source Oral, resp. rate (!) 29, height 5\' 9"  (1.753 m), weight 92 kg, SpO2 90 %.        Intake/Output Summary (Last 24 hours) at 08/01/2021 1636 Last data  filed at 08/01/2021 1000 Gross per 24 hour  Intake 1765.49 ml  Output 600 ml  Net 1165.49 ml    Filed Weights   07/30/21 0510 07/31/21 0500 08/01/21 0500  Weight: 93.1 kg 92.7 kg 92 kg    Examination: General: Adult male, acutely ill, lying in bed, NAD HEENT: MM pink/moist, anicteric, atraumatic, neck supple Neuro: A&O x 4, able to follow commands, PERRL +3, MAE CV: s1s2 RRR, NSR on monitor, no r/m/g Pulm: Regular, non labored on RA, breath sounds clear throughout GI: soft, rounded, non tender, bs x 4 Skin: LUE as below, no other open/reddened areas    Extremities: warm/dry, pulses + 2 R/P, +1 edema noted in LUE only  Resolved Hospital Problem list     Assessment & Plan:  Sepsis with septic shock due to suspected LUE cellulitis Lactic: 3.2, Baseline PCT: pending, UA: pending, CXR: no acute process  Initial interventions/workup included: 2.5 L of NS/LR & Cefepime/ Vancomycin - Supplemental oxygen as needed, to maintain SpO2 > 90% - f/u cultures, trend lactic/ PCT - Daily CBC, monitor WBC/ fever curve - IV antibiotics: cefepime & vancomycin  - cautious IVF hydration as LVEF is 25-30% - Continue vasopressors to maintain MAP< 65, norepinephrine - Strict I/O's: alert provider if UOP < 0.5 mL/kg/hr - f/u cortisol level > persistent hypotension consider stress dose steroids -surg consult  Mildly Elevated Troponin secondary suspected demand ischemia HFrEF without acute exacerbation ICM with ICD in place HLD CAD s/p PCI PMHx: HTN, VTach Troponin: 34 > 31, echo: 12/22 LVEF 25-30%akinesis of LV apical segment,  G1DD - continue outpatient: ASA, plavix, amiodarone - continuous cardiac monitoring - cautious IVF hydration - outpatient regimen on hold while patient requiring vasopressor support, consider restarting as patient stabilizes: entresto, coreg, lasix  Acute Kidney Injury superimposed on CKD Stage 3a secondary to shock in the setting of sepsis NAGMA Baseline Cr: 1.3  (average, most recent 1.24- but 1.33-1.55 over the last year) Cr on admission: 2.61 - Strict I/O's: alert provider if UOP < 0.5 mL/kg/hr - gentle IVF hydration  - Daily BMP, replace electrolytes PRN - Avoid nephrotoxic agents as able, ensure adequate renal perfusion  Mild Transaminitis secondary to shock in the setting of sepsis AST: 104, ALT: 90 - Trend hepatic function - Consider RUQ [place] if continues to uptrend - avoid hepatotoxic agents - outpatient Lipitor & Zetia on hold until hepatic function normalizes  Former Smoker - bronchodilators PRN  Anxiety - continue outpatient Xanax TID PRN Best Practice (right click and "Reselect all SmartList Selections" daily)  Diet/type: Regular consistency (see orders) DVT prophylaxis: prophylactic heparin  GI prophylaxis: N/A Lines: N/A Foley:  N/A Code Status:  full code Last date of multidisciplinary goals of care discussion [07/29/21]  Labs   CBC: Recent Labs  Lab 07/29/21 1830 07/30/21 0527 07/31/21 0520 08/01/21 0500  WBC 17.4* 20.3* 15.8* 12.6*  NEUTROABS 16.2*  --   --   --   HGB 15.5 13.6 12.3* 11.9*  HCT 46.9 41.5 37.0* 36.7*  MCV 90.4 91.0 90.0 91.8  PLT 142* 122* 93* 76*     Basic Metabolic Panel: Recent Labs  Lab 07/29/21 1830 07/29/21 2026 07/30/21 0527 07/31/21 0520 08/01/21 0500  NA 131*  --  133* 134* 131*  K 4.1  --  4.1 4.1 4.0  CL 99  --  101 104 101  CO2 19*  --  20* 24 25  GLUCOSE 68*  --  93 117* 112*  BUN 37*  --  40* 33* 24*  CREATININE 2.61*  --  2.48* 1.88* 1.26*  CALCIUM 8.9  --  8.0* 7.7* 8.1*  MG  --  1.5* 2.6* 2.4  --   PHOS  --  3.4 3.1 3.3  --     GFR: Estimated Creatinine Clearance: 63.7 mL/min (A) (by C-G formula based on SCr of 1.26 mg/dL (H)). Recent Labs  Lab 07/29/21 1830 07/29/21 2026 07/30/21 0029 07/30/21 0527 07/31/21 0520 08/01/21 0500  PROCALCITON  --  27.38  --  18.93 5.47  --   WBC 17.4*  --   --  20.3* 15.8* 12.6*  LATICACIDVEN  --  3.2* 2.8*  --   --   --       Liver Function Tests: Recent Labs  Lab 07/29/21 1830 07/30/21 0527 07/31/21 0520 08/01/21 0500  AST 104* 105* 59* 21  ALT 90* 106* 89* 41  ALKPHOS 103 94 82 69  BILITOT 1.5* 1.6* 1.0 0.7  PROT 7.6 6.3* 5.6* 5.6*  ALBUMIN 3.8 3.0* 2.5* 2.3*    No results for input(s): LIPASE, AMYLASE in the last 168 hours. No results for input(s): AMMONIA in the last 168 hours.  ABG No results found for: PHART, PCO2ART, PO2ART, HCO3, TCO2, ACIDBASEDEF, O2SAT   Coagulation Profile: No results for input(s): INR, PROTIME in the last 168 hours.  Cardiac Enzymes: No results for input(s): CKTOTAL, CKMB, CKMBINDEX, TROPONINI in the last 168 hours.  HbA1C: Hgb A1c MFr Bld  Date/Time Value Ref Range Status  04/18/2021 10:38 AM 5.6 4.6 - 6.5 % Final  Comment:    Glycemic Control Guidelines for People with Diabetes:Non Diabetic:  <6%Goal of Therapy: <7%Additional Action Suggested:  >8%   03/03/2020 02:17 PM 5.4 <5.7 % of total Hgb Final    Comment:    For the purpose of screening for the presence of diabetes: . <5.7%       Consistent with the absence of diabetes 5.7-6.4%    Consistent with increased risk for diabetes             (prediabetes) > or =6.5%  Consistent with diabetes . This assay result is consistent with a decreased risk of diabetes. . Currently, no consensus exists regarding use of hemoglobin A1c for diagnosis of diabetes in children. . According to American Diabetes Association (ADA) guidelines, hemoglobin A1c <7.0% represents optimal control in non-pregnant diabetic patients. Different metrics may apply to specific patient populations.  Standards of Medical Care in Diabetes(ADA). .     CBG: Recent Labs  Lab 07/29/21 2319 07/30/21 0035 07/30/21 0510  GLUCAP 69* 97 88     Review of Systems: Positives in BOLD  Gen: Denies fever, chills, weight change, fatigue, night sweats HEENT: Denies blurred vision, double vision, hearing loss, tinnitus, sinus  congestion, rhinorrhea, sore throat, neck stiffness, dysphagia PULM: Denies shortness of breath, cough, sputum production, hemoptysis, wheezing CV: Denies chest tightness, edema, orthopnea, paroxysmal nocturnal dyspnea, palpitations GI: Denies abdominal pain, nausea, vomiting, diarrhea, hematochezia, melena, constipation, change in bowel habits GU: Denies dysuria, hematuria, polyuria, oliguria, urethral discharge Endocrine: Denies hot or cold intolerance, polyuria, polyphagia or appetite change Derm: Denies rash, dry skin, scaling or peeling skin change, swollen/tender/reddened/open area LUE Heme: Denies easy bruising, bleeding, bleeding gums Neuro: Denies headache, numbness, weakness, slurred speech, loss of memory or consciousness  Past Medical History:  He,  has a past medical history of Coronary artery disease, Family history of lung cancer, HFrEF (heart failure with reduced ejection fraction) ([place]), HLD (hyperlipidemia), HTN (hypertension), ICD  single,BSX, Ischemic cardiomyopathy, Personal history of colonic polyps, Pneumonia, Syncope and collapse, and Ventricular tachycardia (11/2011).   Surgical History:   Past Surgical History:  Procedure Laterality Date   appendectomy     CARDIAC CATHETERIZATION  Nov 30 2011   [facility]   CARDIAC DEFIBRILLATOR PLACEMENT  May 2013   COLONOSCOPY WITH PROPOFOL N/A 12/22/2020   Procedure: COLONOSCOPY WITH PROPOFOL;  Surgeon: Jonathon Bellows, MD;  Location: [facility] ENDOSCOPY;  Service: Gastroenterology;  Laterality: N/A;   CORONARY ANGIOPLASTY  2014   x 2 stents   CORONARY STENT PLACEMENT     x7   LEFT HEART CATH AND CORONARY ANGIOGRAPHY Left 08/30/2019   Procedure: LEFT HEART CATH AND CORONARY ANGIOGRAPHY;  Surgeon: Wellington Hampshire, MD;  Location: [facility];  Service: Cardiovascular;  Laterality: Left;   PROSTATE SURGERY     urethra grew around prostate    TUMOR REMOVAL  2001   chest thymic cyst; behind lungs [facility] benign      Social History:    reports that he quit smoking about 20 months ago. His smoking use included cigarettes. He has a 21.00 pack-year smoking history. He has never used smokeless tobacco. He reports current alcohol use of about 8.0 standard drinks per week. He reports that he does not use drugs.   Family History:  His family history includes Cancer in his father; Cancer - Lung in his mother; Dementia in his maternal grandmother; Heart attack in his mother and paternal grandfather; Hypertension in his mother. There is no history of Colon  cancer, Stomach cancer, or Pancreatic cancer.   Allergies Allergies  Allergen Reactions   Nitroglycerin Nausea Only and Other (See Comments)    Per patient "causes severe  headache"     Home Medications  Prior to Admission medications   Medication Sig Start Date End Date Taking? Authorizing Provider  albuterol (VENTOLIN HFA) 108 (90 Base) MCG/ACT inhaler Inhale 1 puff into the lungs every 6 (six) hours as needed for wheezing or shortness of breath.    [provider]  ALPRAZolam Duanne Moron) 0.5 MG tablet Take 1 tablet (0.5 mg total) by mouth 3 (three) times daily as needed. for anxiety 06/26/21   McLean-Scocuzza, Nino Glow, MD  amiodarone (PACERONE) 200 MG tablet Take 1 tablet (200 mg total) by mouth daily. 06/11/21   Deboraha Sprang, MD  aspirin 81 MG EC tablet Take 1 tablet (81 mg total) by mouth daily. 09/11/15   Minna Merritts, MD  atorvastatin (LIPITOR) 80 MG tablet Take 1 tablet (80 mg total) by mouth daily. 02/14/21   McLean-Scocuzza, Nino Glow, MD  carvedilol (COREG) 3.125 MG tablet Take 1 tablet (3.125 mg total) by mouth 2 (two) times daily. 05/02/21   Minna Merritts, MD  clindamycin (CLEOCIN) 300 MG capsule Take 300 mg by mouth 4 (four) times daily. 06/13/21   [provider]  clopidogrel (PLAVIX) 75 MG tablet Take 1 tablet (75 mg total) by mouth daily. 06/26/21   McLean-Scocuzza, Nino Glow, MD  ezetimibe (ZETIA) 10 MG tablet Take 1 tablet (10 mg total) by mouth  daily. 06/26/21   McLean-Scocuzza, Nino Glow, MD  furosemide (LASIX) 40 MG tablet Take 1 tablet (40 mg total) by mouth 2 (two) times daily. 06/26/21   McLean-Scocuzza, Nino Glow, MD  HYDROcodone-acetaminophen (NORCO) 5-325 MG tablet Take 1 tablet by mouth 2 (two) times daily as needed for moderate pain. 06/26/21   McLean-Scocuzza, Nino Glow, MD  ipratropium-albuterol (DUONEB) 0.5-2.5 (3) MG/3ML SOLN Take 3 mLs by nebulization every 6 (six) hours as needed. 08/08/20   McLean-Scocuzza, Nino Glow, MD  mupirocin ointment (BACTROBAN) 2 % Apply 1 application topically 2 (two) times daily. 06/26/21   McLean-Scocuzza, Nino Glow, MD  potassium chloride (KLOR-CON 10) 10 MEQ tablet Take 1 tablet (10 mEq total) by mouth daily. 06/26/21   McLean-Scocuzza, Nino Glow, MD  sacubitril-valsartan (ENTRESTO) 97-103 MG Take 1 tablet by mouth 2 (two) times daily. 06/29/21   Minna Merritts, MD  spironolactone (ALDACTONE) 25 MG tablet Take 1 tablet (25 mg total) by mouth daily. 07/24/21 10/22/21  Minna Merritts, MD  sulfamethoxazole-trimethoprim (BACTRIM DS) 800-160 MG tablet Take 1 tablet by mouth 2 (two) times daily. With food 06/26/21   McLean-Scocuzza, Nino Glow, MD  tadalafil (CIALIS) 10 MG tablet Take 1-2 tablets (10-20 mg total) by mouth daily as needed for erectile dysfunction. 06/29/20   Billey Co, MD  Tiotropium Bromide-Olodaterol (STIOLTO RESPIMAT) 2.5-2.5 MCG/ACT AERS Inhale 2 puffs into the lungs daily. 08/12/19   Tyler Pita, MD     Critical care provider statement:   Total critical care time: 33 minutes   Performed by: Lanney Gins MD   Critical care time was exclusive of separately billable procedures and treating other patients.   Critical care was necessary to treat or prevent imminent or life-threatening deterioration.   Critical care was time spent personally by me on the following activities: development of treatment plan with patient and/or surrogate as well as nursing, discussions with  consultants, evaluation of patient's response to treatment, examination of  patient, obtaining history from patient or surrogate, ordering and performing treatments and interventions, ordering and review of laboratory studies, ordering and review of radiographic studies, pulse oximetry and re-evaluation of patient's condition.    Ottie Glazier, M.D.  Pulmonary & Critical Care Medicine

## 2021-08-01 NOTE — Consult Note (Signed)
NAME: Ian Mills  DOB: 16-Jan-1956  MRN: [id number]  Date/Time: 08/01/2021 3:46 PM  REQUESTING PROVIDER: Deniece Portela Subjective:  REASON FOR CONSULT: group A strep bacteremia ? Ian Mills is a 66 y.o. with a history of CAD, S/p Stent, AICD for ventricular tachycardia, ischemic cardiomyopathy, HTN, presents on 07/29/21 with light headed ness, weakness and left arm redness and warmth. He apparently fell and hit his left arm. The arm started to swell and started draining.  In the ED BP 92/58, temp 98, HR 66, RR 27. Sats 96% WBC 17.4, HB 15.5, PLT 142 and cr 2.61 Blood culture sent and he was started on vanco/cefepime /clinda for the cellulitis- seen by surgery. Xray of the arm did not show any gas. He is being managed with antibiotics and is in ICU because of hypotension which has resolved I am seeing the patient because of Group A strep bacteremia He is currently on cefazolin and clindamycin Leucocytosis has almost resolved Hypotension resolved He is feeling better overall except left arm which appears more swollen  Past Medical History:  Diagnosis Date   Coronary artery disease    a. 3 stents to the LAD, 1 to the PDA, and 1 to the Reader; b. cath 06/2013: chronically occluded LAD, patent stent LAD, D1 20%, pLCx 30%, mLCx 20%, pRCA 70% (FFR 0.74) s/p PCI/DES, mRCA 70% s/p PCI/DES, RPDA 50%, EF 20% w/ AK of anterolateral & apical walls, moderately elevated LVEDP   Family history of lung cancer    HFrEF (heart failure with reduced ejection fraction) ([facility])    a. 05/2014 Echo: EF 25-30%; b. 01/2018 Echo ([facility]): EF 30%.   HLD (hyperlipidemia)    HTN (hypertension)    ICD  single,BSX    a. DOI 11/2011; b. S/N# [id number]   Ischemic cardiomyopathy    a. 05/2014 Echo: EF 25-30%; b. 01/2018 Echo ([facility]): EF 30%, sev glob HK w/ apical/ant AK. Mild LVH. Mod LAE, mild RAE. Triv MR/TR.   Personal history of colonic polyps    Pneumonia    08/2018   Syncope and collapse    Ventricular tachycardia 11/2011    a. s/p AICD implant     Past Surgical History:  Procedure Laterality Date   appendectomy     CARDIAC CATHETERIZATION  Nov 30 2011   [facility]   CARDIAC DEFIBRILLATOR PLACEMENT  May 2013   COLONOSCOPY WITH PROPOFOL N/A 12/22/2020   Procedure: COLONOSCOPY WITH PROPOFOL;  Surgeon: Jonathon Bellows, MD;  Location: [facility];  Service: Gastroenterology;  Laterality: N/A;   CORONARY ANGIOPLASTY  2014   x 2 stents   CORONARY STENT PLACEMENT     x7   LEFT HEART CATH AND CORONARY ANGIOGRAPHY Left 08/30/2019   Procedure: LEFT HEART CATH AND CORONARY ANGIOGRAPHY;  Surgeon: Wellington Hampshire, MD;  Location: [facility];  Service: Cardiovascular;  Laterality: Left;   PROSTATE SURGERY     urethra grew around prostate    TUMOR REMOVAL  2001   chest thymic cyst; behind lungs [facility] benign     Social History   Socioeconomic History   Marital status: Married    Spouse name: Hilda Blades    Number of children: 2   Years of education: Not on file   Highest education level: Not on file  Occupational History   Occupation: retired   Tobacco Use   Smoking status: Former    Packs/day: 0.50    Years: 42.00    Pack years: 21.00    Types: Cigarettes  Quit date: 11/10/2019    Years since quitting: 1.7   Smokeless tobacco: Never  Vaping Use   Vaping Use: Never used  Substance and Sexual Activity   Alcohol use: Yes    Alcohol/week: 8.0 standard drinks    Types: 8 Cans of beer per week    Comment: per week   Drug use: No   Sexual activity: Not on file  Other Topics Concern   Not on file  Social History Narrative   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    Lives in [place]    2 kids daughters    Gets regular exercise   Smoker   [employer]    Social Determinants of Health   Financial Resource Strain: Medium Risk   Difficulty of Paying Living Expenses: Somewhat hard  Food Insecurity: No Food Insecurity   Worried About  Charity fundraiser in the Last Year: Never true   Arboriculturist in the Last Year: Never true  Transportation Needs: No Transportation Needs   Lack of Transportation (Medical): No   Lack of Transportation (Non-Medical): No  Physical Activity: Insufficiently Active   Days of Exercise per Week: 4 days   Minutes of Exercise per Session: 20 min  Stress: No Stress Concern Present   Feeling of Stress : Not at all  Social Connections: Unknown   Frequency of Communication with Friends and Family: More than three times a week   Frequency of Social Gatherings with Friends and Family: Not on file   Attends Religious Services: Not on Electrical engineer or Organizations: Not on file   Attends Archivist Meetings: Not on file   Marital Status: Not on file  Intimate Partner Violence: Not At Risk   Fear of Current or Ex-Partner: No   Emotionally Abused: No   Physically Abused: No   Sexually Abused: No    Family History  Problem Relation Age of Onset   Cancer - Lung Mother    Hypertension Mother    Heart attack Mother    Cancer Father        larynx   Dementia Maternal Grandmother    Heart attack Paternal Grandfather    Colon cancer Neg Hx    Stomach cancer Neg Hx    Pancreatic cancer Neg Hx    Allergies  Allergen Reactions   Nitroglycerin Nausea Only and Other (See Comments)    Per patient "causes severe  headache"   I? Current Facility-Administered Medications  Medication Dose Route Frequency Provider Last Rate Last Admin   0.9 %  sodium chloride infusion  250 mL Intravenous Continuous Delman Kitten, MD 10 mL/hr at 07/30/21 1704 Rate Change at 07/30/21 1704   albuterol (PROVENTIL) (2.5 MG/3ML) 0.083% nebulizer solution 2.5 mg  2.5 mg Nebulization Q6H PRN Renda Rolls, RPH       ALPRAZolam Duanne Moron) tablet 0.5 mg  0.5 mg Oral TID PRN Rust-Chester, Toribio Harbour L, NP   0.5 mg at 08/01/21 1209   amiodarone (PACERONE) tablet 200 mg  200 mg Oral Daily Rust-Chester, Britton  L, NP   200 mg at 08/01/21 0904   aspirin EC tablet 81 mg  81 mg Oral Daily Rust-Chester, Britton L, NP   81 mg at 08/01/21 0904   ceFAZolin (ANCEF) IVPB 2g/100 mL premix  2 g Intravenous Q8H Ottie Glazier, MD   Stopped at 08/01/21  7106   Chlorhexidine Gluconate Cloth 2 % PADS 6 each  6 each Topical Daily Ottie Glazier, MD   6 each at 08/01/21 0905   clindamycin (CLEOCIN) IVPB 600 mg  600 mg Intravenous Q8H Ottie Glazier, MD 100 mL/hr at 08/01/21 1524 600 mg at 08/01/21 1524   clopidogrel (PLAVIX) tablet 75 mg  75 mg Oral Daily Rust-Chester, Huel Cote, NP   75 mg at 08/01/21 2694   docusate sodium (COLACE) capsule 100 mg  100 mg Oral BID PRN Rust-Chester, Toribio Harbour L, NP       enoxaparin (LOVENOX) injection 40 mg  40 mg Subcutaneous QHS Aleskerov, Fuad, MD       HYDROmorphone (DILAUDID) injection 1 mg  1 mg Intravenous Q4H PRN Ottie Glazier, MD   1 mg at 08/01/21 0600   ipratropium-albuterol (DUONEB) 0.5-2.5 (3) MG/3ML nebulizer solution 3 mL  3 mL Nebulization Q6H PRN Rust-Chester, Huel Cote, NP       lactated ringers infusion   Intravenous Continuous Ottie Glazier, MD 80 mL/hr at 08/01/21 1000 Infusion Verify at 08/01/21 1000   norepinephrine (LEVOPHED) 16 mg in 217mL premix infusion  0-40 mcg/min Intravenous Titrated Rust-Chester, Huel Cote, NP   Stopped at 08/01/21 0903   polyethylene glycol (MIRALAX / GLYCOLAX) packet 17 g  17 g Oral Daily PRN Rust-Chester, Huel Cote, NP       silver sulfADIAZINE (SILVADENE) 1 % cream   Topical BID Ottie Glazier, MD   1 application at 85/46/27 0904   sodium hypochlorite (DAKIN'S 1/4 STRENGTH) topical solution   Topical BID Ottie Glazier, MD   1 application at 03/50/09 0904     Abtx:  Anti-infectives (From admission, onward)    Start     Dose/Rate Route Frequency Ordered Stop   07/30/21 2200  vancomycin (VANCOCIN) IVPB 1000 mg/200 mL premix  Status:  Discontinued        1,000 mg 200 mL/hr over 60 Minutes Intravenous Every 24 hours 07/30/21 0602  07/30/21 1012   07/30/21 1400  ceFAZolin (ANCEF) IVPB 2g/100 mL premix        2 g 200 mL/hr over 30 Minutes Intravenous Every 8 hours 07/30/21 1012     07/30/21 1400  clindamycin (CLEOCIN) IVPB 600 mg        600 mg 100 mL/hr over 30 Minutes Intravenous Every 8 hours 07/30/21 1012     07/30/21 0900  ceFEPIme (MAXIPIME) 2 g in sodium chloride 0.9 % 100 mL IVPB  Status:  Discontinued        2 g 200 mL/hr over 30 Minutes Intravenous Every 12 hours 07/30/21 0106 07/30/21 1012   07/30/21 0105  vancomycin variable dose per unstable renal function (pharmacist dosing)  Status:  Discontinued         Does not apply See admin instructions 07/30/21 0105 07/30/21 0606   07/29/21 2030  ceFEPIme (MAXIPIME) 2 g in sodium chloride 0.9 % 100 mL IVPB        2 g 200 mL/hr over 30 Minutes Intravenous  Once 07/29/21 2028 07/29/21 2110   07/29/21 2030  vancomycin (VANCOREADY) IVPB 2000 mg/400 mL        2,000 mg 200 mL/hr over 120 Minutes Intravenous  Once 07/29/21 2028 07/30/21 0056       REVIEW OF SYSTEMS:  Const: fever,  chills, negative weight loss Eyes: negative diplopia or visual changes, negative eye pain ENT: negative coryza, negative sore throat Resp: cough since hospitalization,no  hemoptysis, dyspnea Cards: negative for chest pain, palpitations,  has lower extremity edema GU: negative for frequency, dysuria and hematuria GI: Negative for abdominal pain, diarrhea, bleeding, constipation Skin: as above Heme: negative for easy bruising and gum/nose bleeding MS: generalized weakness Neurolo:+ headaches, ++dizziness, no vertigo, memory problems  Psych: negative for feelings of anxiety, depression  Endocrine: negative for thyroid, diabetes Allergy/Immunology- as above Objective:  VITALS:  BP (!) 97/57    Pulse 71    Temp 98.2 F (36.8 C) (Oral)    Resp 20    Ht 5\' 9"  (1.753 m)    Wt 92 kg    SpO2 93%    BMI 29.95 kg/m  PHYSICAL EXAM:  General: Alert, cooperative, no distress, fatigued,  ill Head: Normocephalic, without obvious abnormality, atraumatic. Eyes: Conjunctivae clear, anicteric sclerae. Pupils are equal ENT Nares normal. No drainage or sinus tenderness. Lips, mucosa, and tongue normal. No Thrush Neck:  symmetrical, no adenopathy, thyroid: non tender no carotid bruit and no JVD. Lungs: b/l air entry [place] Heart: Regular rate and rhythm, no murmur, rub or gallop. Abdomen: Soft, non-tender,not distended. Bowel sounds normal. No masses Extremities: left arm swollen- epidermal blistering which is flattened. Radial pulse felt Dusky erythema     Skin: as above Lymph: Cervical, supraclavicular normal. Neurologic: Grossly non-focal Pertinent Labs Lab Results CBC    Component Value Date/Time   WBC 12.6 (H) 08/01/2021 0500   RBC 4.00 (L) 08/01/2021 0500   HGB 11.9 (L) 08/01/2021 0500   HGB CANCELED 08/18/2019 1139   HCT 36.7 (L) 08/01/2021 0500   HCT CANCELED 08/18/2019 1139   PLT 76 (L) 08/01/2021 0500   PLT CANCELED 08/18/2019 1139   MCV 91.8 08/01/2021 0500   MCV 94 05/30/2014 0455   MCH 29.8 08/01/2021 0500   MCHC 32.4 08/01/2021 0500   RDW 14.6 08/01/2021 0500   RDW 14.4 05/30/2014 0455   LYMPHSABS 0.5 (L) 07/29/2021 1830   LYMPHSABS 1.9 05/30/2014 0455   MONOABS 0.4 07/29/2021 1830   MONOABS 0.8 05/30/2014 0455   EOSABS 0.0 07/29/2021 1830   EOSABS 0.1 05/30/2014 0455   BASOSABS 0.1 07/29/2021 1830   BASOSABS 0.1 05/30/2014 0455    CMP Latest Ref Rng & Units 08/01/2021 07/31/2021 07/30/2021  Glucose 70 - 99 mg/dL 112(H) 117(H) 93  BUN 8 - 23 mg/dL 24(H) 33(H) 40(H)  Creatinine 0.61 - 1.24 mg/dL 1.26(H) 1.88(H) 2.48(H)  Sodium 135 - 145 mmol/L 131(L) 134(L) 133(L)  Potassium 3.5 - 5.1 mmol/L 4.0 4.1 4.1  Chloride 98 - 111 mmol/L 101 104 101  CO2 22 - 32 mmol/L 25 24 20(L)  Calcium 8.9 - 10.3 mg/dL 8.1(L) 7.7(L) 8.0(L)  Total Protein 6.5 - 8.1 g/dL 5.6(L) 5.6(L) 6.3(L)  Total Bilirubin 0.3 - 1.2 mg/dL 0.7 1.0 1.6(H)  Alkaline Phos 38 -  126 U/L 69 82 94  AST 15 - 41 U/L 21 59(H) 105(H)  ALT 0 - 44 U/L 41 89(H) 106(H)      Microbiology: Recent Results (from the past 240 hour(s))  Resp Panel by RT-PCR (Flu A&B, Covid) Nasopharyngeal Swab     Status: None   Collection Time: 07/29/21  7:43 PM   Specimen: Nasopharyngeal Swab; Nasopharyngeal(NP) swabs in vial transport medium  Result Value Ref Range Status   SARS Coronavirus 2 by RT PCR NEGATIVE NEGATIVE Final    Comment: (NOTE) SARS-CoV-2 target nucleic acids are NOT DETECTED.  The SARS-CoV-2 RNA is generally detectable in upper respiratory specimens during the acute phase of infection. The lowest concentration of SARS-CoV-2 viral copies this  assay can detect is 138 copies/mL. A negative result does not preclude SARS-Cov-2 infection and should not be used as the sole basis for treatment or other patient management decisions. A negative result may occur with  improper specimen collection/handling, submission of specimen other than nasopharyngeal swab, presence of viral mutation(s) within the areas targeted by this assay, and inadequate number of viral copies(<138 copies/mL). A negative result must be combined with clinical observations, patient history, and epidemiological information. The expected result is Negative.  Fact Sheet for Patients:  EntrepreneurPulse.com.au  Fact Sheet for Healthcare Providers:  IncredibleEmployment.be  This test is no t yet approved or cleared by the [place] FDA and  has been authorized for detection and/or diagnosis of SARS-CoV-2 by FDA under an Emergency Use Authorization (EUA). This EUA will remain  in effect (meaning this test can be used) for the duration of the COVID-19 declaration under Section 564(b)(1) of the Act, 21 U.S.C.section 360bbb-3(b)(1), unless the authorization is terminated  or revoked sooner.       Influenza A by PCR NEGATIVE NEGATIVE Final   Influenza B by PCR  NEGATIVE NEGATIVE Final    Comment: (NOTE) The Xpert Xpress SARS-CoV-2/FLU/RSV plus assay is intended as an aid in the diagnosis of influenza from Nasopharyngeal swab specimens and should not be used as a sole basis for treatment. Nasal washings and aspirates are unacceptable for Xpert Xpress SARS-CoV-2/FLU/RSV testing.  Fact Sheet for Patients: EntrepreneurPulse.com.au  Fact Sheet for Healthcare Providers: IncredibleEmployment.be  This test is not yet approved or cleared by the [place] FDA and has been authorized for detection and/or diagnosis of SARS-CoV-2 by FDA under an Emergency Use Authorization (EUA). This EUA will remain in effect (meaning this test can be used) for the duration of the COVID-19 declaration under Section 564(b)(1) of the Act, 21 U.S.C. section 360bbb-3(b)(1), unless the authorization is terminated or revoked.  Performed at [facility], [address] [place], [place] [address]   Culture, blood (Routine X 2) w Reflex to ID Panel     Status: Abnormal   Collection Time: 07/29/21  8:17 PM   Specimen: BLOOD  Result Value Ref Range Status   Specimen Description   Final    BLOOD BLOOD RIGHT HAND Performed at [facility], [address] [place], [place] [address]    Special Requests   Final    BOTTLES DRAWN AEROBIC AND ANAEROBIC Blood Culture results may not be optimal due to an inadequate volume of blood received in culture bottles Performed at [facility], [address], [place], [place] [address]    Culture  Setup Time   Final    GRAM POSITIVE COCCI ANAEROBIC BOTTLE ONLY CRITICAL VALUE NOTED.  VALUE IS CONSISTENT WITH PREVIOUSLY REPORTED AND CALLED VALUE. Performed at [facility], [place], [place] [address]    Culture (A)  Final    GROUP A STREP (S.PYOGENES) ISOLATED SUSCEPTIBILITIES PERFORMED ON PREVIOUS CULTURE WITHIN THE LAST 5 DAYS. Performed at  [facility], [place] [address] [place], [place] [address]    Report Status 08/01/2021 FINAL  Final  Culture, blood (Routine X 2) w Reflex to ID Panel     Status: Abnormal   Collection Time: 07/29/21  8:17 PM   Specimen: BLOOD  Result Value Ref Range Status   Specimen Description   Final    BLOOD LEFT ANTECUBITAL Performed at [facility], [address], [place], [place] [address]    Special Requests  Final    BOTTLES DRAWN AEROBIC AND ANAEROBIC Blood Culture results may not be optimal due to an inadequate volume of blood received in culture bottles Performed at [facility], [address] [place], [place] [address]    Culture  Setup Time   Final    GRAM POSITIVE COCCI IN BOTH AEROBIC AND ANAEROBIC BOTTLES Organism ID to follow CRITICAL RESULT CALLED TO, READ BACK BY AND VERIFIED WITHArdeen Garland [facility] [id number] 07/30/21 HNM Performed at [facility], [address], [place], [place] [address]    Culture (A)  Final    GROUP A STREP (S.PYOGENES) ISOLATED HEALTH DEPARTMENT NOTIFIED Performed at [facility], [place] [address], [place], [place] [address]    Report Status 08/01/2021 FINAL  Final   Organism ID, Bacteria GROUP A STREP (S.PYOGENES) ISOLATED  Final      Susceptibility   Group a strep (s.pyogenes) isolated - MIC*    PENICILLIN <=0.06 SENSITIVE Sensitive     CEFTRIAXONE <=0.12 SENSITIVE Sensitive     ERYTHROMYCIN <=0.12 SENSITIVE Sensitive     LEVOFLOXACIN 0.5 SENSITIVE Sensitive     VANCOMYCIN 0.5 SENSITIVE Sensitive     * GROUP A STREP (S.PYOGENES) ISOLATED  Blood Culture ID Panel (Reflexed)     Status: Abnormal   Collection Time: 07/29/21  8:17 PM  Result Value Ref Range Status   Enterococcus faecalis NOT DETECTED NOT DETECTED Final   Enterococcus Faecium NOT DETECTED NOT DETECTED Final   Listeria monocytogenes NOT DETECTED NOT DETECTED Final   Staphylococcus species NOT DETECTED NOT DETECTED Final   Staphylococcus aureus  (BCID) NOT DETECTED NOT DETECTED Final   Staphylococcus epidermidis NOT DETECTED NOT DETECTED Final   Staphylococcus lugdunensis NOT DETECTED NOT DETECTED Final   Streptococcus species DETECTED (A) NOT DETECTED Final    Comment: CRITICAL RESULT CALLED TO, READ BACK BY AND VERIFIED WITH: [name] HICKS PHARMD 0919 07/30/21 HNM    Streptococcus agalactiae NOT DETECTED NOT DETECTED Final   Streptococcus pneumoniae NOT DETECTED NOT DETECTED Final   Streptococcus pyogenes DETECTED (A) NOT DETECTED Final    Comment: CRITICAL RESULT CALLED TO, READ BACK BY AND VERIFIED WITH: Ardeen Garland PHARMD [id number] 07/30/21 HNM    A.calcoaceticus-baumannii NOT DETECTED NOT DETECTED Final   Bacteroides fragilis NOT DETECTED NOT DETECTED Final   Enterobacterales NOT DETECTED NOT DETECTED Final   Enterobacter cloacae complex NOT DETECTED NOT DETECTED Final   Escherichia coli NOT DETECTED NOT DETECTED Final   Klebsiella aerogenes NOT DETECTED NOT DETECTED Final   Klebsiella oxytoca NOT DETECTED NOT DETECTED Final   Klebsiella pneumoniae NOT DETECTED NOT DETECTED Final   Proteus species NOT DETECTED NOT DETECTED Final   Salmonella species NOT DETECTED NOT DETECTED Final   Serratia marcescens NOT DETECTED NOT DETECTED Final   Haemophilus influenzae NOT DETECTED NOT DETECTED Final   Neisseria meningitidis NOT DETECTED NOT DETECTED Final   Pseudomonas aeruginosa NOT DETECTED NOT DETECTED Final   Stenotrophomonas maltophilia NOT DETECTED NOT DETECTED Final   Candida albicans NOT DETECTED NOT DETECTED Final   Candida auris NOT DETECTED NOT DETECTED Final   Candida glabrata NOT DETECTED NOT DETECTED Final   Candida krusei NOT DETECTED NOT DETECTED Final   Candida parapsilosis NOT DETECTED NOT DETECTED Final   Candida tropicalis NOT DETECTED NOT DETECTED Final   Cryptococcus neoformans/gattii NOT DETECTED NOT DETECTED Final    Comment: Performed at [facility], [address] [place], [place] [address]   Aerobic Culture w Gram Stain (superficial specimen)  Status: None   Collection Time: 07/30/21  2:05 AM   Specimen: Arm; Wound  Result Value Ref Range Status   Specimen Description   Final    ARM LEFT Performed at [facility], [address], [place], [place] [address]    Special Requests   Final    NONE Performed at [facility], [address] [place], [place] [address]    Gram Stain   Final    FEW NO SQUAMOUS EPITHELIAL CELLS SEEN FEW WBC SEEN FEW GRAM POSITIVE COCCI    Culture   Final    ABUNDANT STREPTOCOCCUS PYOGENES Beta hemolytic streptococci are predictably susceptible to penicillin and other beta lactams. Susceptibility testing not routinely performed. Performed at [facility], [place] [address], [place],  [address]    Report Status 07/31/2021 FINAL  Final    IMAGING RESULTS: Xray forearm reviewed No air  I have personally reviewed the films ? Impression/Recommendation ? ?Streptococcus pyogenes bacteremia  Left arm cellulitis - watch closely for nec facs/compartment syndrome Continue cefazolin+ clindamycin  ?Sepsis with shock due to the above - has resolved  AKI on CKD- improved  Transaminitis due to septic shock  CAD   Ischemic cardiomyopathy /AICD ___________________________________________________ Discussed with patient, wife and requesting provider Note:  This document was prepared using Dragon voice recognition software and may include unintentional dictation errors.

## 2021-08-01 NOTE — Consult Note (Signed)
PHARMACY CONSULT NOTE  Pharmacy Consult for Electrolyte Monitoring and Replacement   Recent Labs: Potassium (mmol/L)  Date Value  08/01/2021 4.0  05/30/2014 3.9   Magnesium (mg/dL)  Date Value  07/31/2021 2.4  06/28/2013 1.9   Calcium (mg/dL)  Date Value  08/01/2021 8.1 (L)   Calcium, Total (mg/dL)  Date Value  05/30/2014 8.1 (L)   Albumin (g/dL)  Date Value  08/01/2021 2.3 (L)  04/06/2021 3.8  05/29/2014 3.6   Phosphorus (mg/dL)  Date Value  07/31/2021 3.3   Sodium (mmol/L)  Date Value  08/01/2021 131 (L)  04/06/2021 139  05/30/2014 137   Assessment: Patient is a 66 y/o M with medical history including CAD, HFrEF, HTN, HLD, Vtach s/p ICD, history of tobacco abuse who is admitted with septic shock secondary to left forearm cellulitis complicated by GAS bacteremia. Pharmacy consulted to assist with electrolyte monitoring and replacement as indicated.  Goal of Therapy:  Electrolytes within normal limits  Plan:  --Hyponatremia noted, continue to monitor --No electrolyte replacement indicated at this time --Follow-up electrolytes with AM labs tomorrow  Benita Gutter 08/01/2021 7:53 AM

## 2021-08-01 NOTE — TOC Progression Note (Signed)
Transition of Care [facility]) - Progression Note    Patient Details  Name: NASH BOLLS MRN: [id number] Date of Birth: 08-18-1955  Transition of Care [facility]) CM/SW Contact  Shelbie Hutching, RN Phone Number: 08/01/2021, 11:40 AM  Clinical Narrative:     Transition of Care [facility]) Screening Note   Patient Details  Name: LAVANCE BEAZER Date of Birth: Dec 06, 1955   Transition of Care [facility]) CM/SW Contact:    Shelbie Hutching, RN Phone Number: 08/01/2021, 11:40 AM    Transition of Care Department [facility]) has reviewed patient and no TOC needs have been identified at this time. We will continue to monitor patient advancement through interdisciplinary progression rounds. If new patient transition needs arise, please place a TOC consult.          Expected Discharge Plan and Services                                                 Social Determinants of Health (SDOH) Interventions    Readmission Risk Interventions No flowsheet data found.

## 2021-08-02 ENCOUNTER — Inpatient Hospital Stay: Payer: Medicare Other

## 2021-08-02 DIAGNOSIS — A400 Sepsis due to streptococcus, group A: Secondary | ICD-10-CM

## 2021-08-02 DIAGNOSIS — I5022 Chronic systolic (congestive) heart failure: Secondary | ICD-10-CM

## 2021-08-02 DIAGNOSIS — L03114 Cellulitis of left upper limb: Secondary | ICD-10-CM

## 2021-08-02 DIAGNOSIS — R7401 Elevation of levels of liver transaminase levels: Secondary | ICD-10-CM

## 2021-08-02 DIAGNOSIS — N189 Chronic kidney disease, unspecified: Secondary | ICD-10-CM

## 2021-08-02 DIAGNOSIS — N179 Acute kidney failure, unspecified: Secondary | ICD-10-CM

## 2021-08-02 LAB — COMPREHENSIVE METABOLIC PANEL
ALT: 27 U/L (ref 0–44)
AST: 21 U/L (ref 15–41)
Albumin: 2.3 g/dL — ABNORMAL LOW (ref 3.5–5.0)
Alkaline Phosphatase: 75 U/L (ref 38–126)
Anion gap: 7 (ref 5–15)
BUN: 24 mg/dL — ABNORMAL HIGH (ref 8–23)
CO2: 26 mmol/L (ref 22–32)
Calcium: 8.1 mg/dL — ABNORMAL LOW (ref 8.9–10.3)
Chloride: 100 mmol/L (ref 98–111)
Creatinine, Ser: 1.21 mg/dL (ref 0.61–1.24)
GFR, Estimated: >60 mL/min (ref >60)
Glucose, Bld: 74 mg/dL (ref 70–99)
Potassium: 3.7 mmol/L (ref 3.5–5.1)
Sodium: 133 mmol/L — ABNORMAL LOW (ref 135–145)
Total Bilirubin: 0.8 mg/dL (ref 0.3–1.2)
Total Protein: 5.3 g/dL — ABNORMAL LOW (ref 6.5–8.1)

## 2021-08-02 LAB — CBC
HCT: 35.8 % — ABNORMAL LOW (ref 39.0–52.0)
Hemoglobin: 11.4 g/dL — ABNORMAL LOW (ref 13.0–17.0)
MCH: 28.9 pg (ref 26.0–34.0)
MCHC: 31.8 g/dL (ref 30.0–36.0)
MCV: 90.6 fL (ref 80.0–100.0)
Platelets: 81 10*3/uL — ABNORMAL LOW (ref 150–400)
RBC: 3.95 MIL/uL — ABNORMAL LOW (ref 4.22–5.81)
RDW: 14.2 % (ref 11.5–15.5)
WBC: 10.2 10*3/uL (ref 4.0–10.5)
nRBC: 0 % (ref 0.0–0.2)

## 2021-08-02 MED ORDER — FUROSEMIDE 40 MG PO TABS: 40.0000 mg | ORAL_TABLET | Freq: Two times a day (BID) | ORAL | Status: DC

## 2021-08-02 MED ORDER — OXYCODONE HCL 5 MG PO TABS
5.0000 mg | ORAL_TABLET | Freq: Four times a day (QID) | ORAL | Status: DC | PRN
  Administered 2021-08-02 – 2021-08-07 (×9): 5 mg via ORAL
  Filled 2021-08-02 (×10): qty 1

## 2021-08-02 MED ORDER — ATORVASTATIN CALCIUM 20 MG PO TABS
80.0000 mg | ORAL_TABLET | Freq: Every day | ORAL | Status: DC
  Administered 2021-08-03 – 2021-08-07 (×4): 80 mg via ORAL
  Filled 2021-08-02 (×4): qty 4

## 2021-08-02 MED ORDER — HYDROMORPHONE HCL 1 MG/ML IJ SOLN
1.0000 mg | INTRAMUSCULAR | Status: DC | PRN
  Administered 2021-08-04 – 2021-08-06 (×8): 1 mg via INTRAVENOUS
  Filled 2021-08-02 (×8): qty 1

## 2021-08-02 MED ORDER — EZETIMIBE 10 MG PO TABS
10.0000 mg | ORAL_TABLET | Freq: Every day | ORAL | Status: DC
  Administered 2021-08-02 – 2021-08-07 (×6): 10 mg via ORAL
  Filled 2021-08-02 (×6): qty 1

## 2021-08-02 MED ORDER — CARVEDILOL 3.125 MG PO TABS
3.1250 mg | ORAL_TABLET | Freq: Two times a day (BID) | ORAL | Status: DC
  Administered 2021-08-02 – 2021-08-07 (×10): 3.125 mg via ORAL
  Filled 2021-08-02 (×10): qty 1

## 2021-08-02 MED ORDER — CLINDAMYCIN PHOSPHATE 900 MG/50ML IV SOLN
900.0000 mg | Freq: Three times a day (TID) | INTRAVENOUS | Status: DC
  Administered 2021-08-02 – 2021-08-03 (×3): 900 mg via INTRAVENOUS
  Filled 2021-08-02 (×5): qty 50

## 2021-08-02 NOTE — Progress Notes (Signed)
ID Pt is out of ICU Says his left arm hurts more as he is now not on IV pain medication Also the arm is hot he says O/E in some discomfort Vitals BP 125/65 (BP Location: Right Arm)    Pulse 70    Temp 98.4 F (36.9 C) (Oral)    Resp 16    Ht 5\' 9"  (1.753 m)    Wt 92 kg    SpO2 94%    BMI 29.95 kg/m   Chest b/l air entry Few rhonchi Hs1s2 tachycardia And soft Left arm warm to touch Swelling slightly better but erythema worse     CNS non focal  Labs CBC Latest Ref Rng & Units 08/02/2021 08/01/2021 07/31/2021  WBC 4.0 - 10.5 K/uL 10.2 12.6(H) 15.8(H)  Hemoglobin 13.0 - 17.0 g/dL 11.4(L) 11.9(L) 12.3(L)  Hematocrit 39.0 - 52.0 % 35.8(L) 36.7(L) 37.0(L)  Platelets 150 - 400 K/uL 81(L) 76(L) 93(L)     CMP Latest Ref Rng & Units 08/02/2021 08/01/2021 07/31/2021  Glucose 70 - 99 mg/dL 74 112(H) 117(H)  BUN 8 - 23 mg/dL 24(H) 24(H) 33(H)  Creatinine 0.61 - 1.24 mg/dL 1.21 1.26(H) 1.88(H)  Sodium 135 - 145 mmol/L 133(L) 131(L) 134(L)  Potassium 3.5 - 5.1 mmol/L 3.7 4.0 4.1  Chloride 98 - 111 mmol/L 100 101 104  CO2 22 - 32 mmol/L 26 25 24   Calcium 8.9 - 10.3 mg/dL 8.1(L) 8.1(L) 7.7(L)  Total Protein 6.5 - 8.1 g/dL 5.3(L) 5.6(L) 5.6(L)  Total Bilirubin 0.3 - 1.2 mg/dL 0.8 0.7 1.0  Alkaline Phos 38 - 126 U/L 75 69 82  AST 15 - 41 U/L 21 21 59(H)  ALT 0 - 44 U/L 27 41 89(H)      Impression/recommendation Streptococcus pyogenes bacteremia  Left arm cellulitis/ soft tissue infection more warm and erythematous - will need CT imaging Continue cefazolin+ clindamycin   ?Sepsis with shock due to the above - has resolved   AKI on CKD- improved Thrombocytopenia due to infection - stable   Transaminitis due to septic shock- resolved   CAD    Ischemic cardiomyopathy /AICD  Discussed the management with patient and Dr.Wieting

## 2021-08-02 NOTE — Progress Notes (Signed)
Subjective:  CC: Ian Mills is a 66 y.o. male  Hospital stay day 4,   left fore arm cellulitis  HPI: No acute issues. Transferred to floor.  Still able to move arm.  ROS:  General: Denies weight loss, weight gain, fatigue, fevers, chills, and night sweats. Heart: Denies chest pain, palpitations, racing heart, irregular heartbeat, leg pain or swelling, and decreased activity tolerance. Respiratory: Denies breathing difficulty, shortness of breath, wheezing, cough, and sputum. GI: Denies change in appetite, heartburn, nausea, vomiting, constipation, diarrhea, and blood in stool. GU: Denies difficulty urinating, pain with urinating, urgency, frequency, blood in urine.   Objective:   Temp:  [97.4 F (36.3 C)-98.4 F (36.9 C)] 98.4 F (36.9 C) (01/05 1616) Pulse Rate:  [69-79] 70 (01/05 1616) Resp:  [16-18] 16 (01/05 1616) BP: (101-125)/(57-77) 125/65 (01/05 1616) SpO2:  [94 %-98 %] 94 % (01/05 1616)     Height: 5\' 9"  (175.3 cm) Weight: 92 kg BMI (Calculated): 29.94   Intake/Output this shift:   Intake/Output Summary (Last 24 hours) at 08/02/2021 1946 Last data filed at 08/02/2021 1842 Gross per 24 hour  Intake 1497.15 ml  Output 675 ml  Net 822.15 ml    Constitutional :  alert, cooperative, appears stated age, and no distress  Respiratory:  clear to auscultation bilaterally  Cardiovascular:  regular rate and rhythm  Gastrointestinal: soft, non-tender; bowel sounds normal; no masses,  no organomegaly.   Skin: Cool and moist. Left forearm with increased swelling, erythema extending beyond elbow now.  Blistering still persistent with serous drainage.  Neurovascular intact, TTP but still no sign of fluctuance. Overall swelling worse again.  Psychiatric: Normal affect, non-agitated, not confused       LABS:  CMP Latest Ref Rng & Units 08/02/2021 08/01/2021 07/31/2021  Glucose 70 - 99 mg/dL 74 112(H) 117(H)  BUN 8 - 23 mg/dL 24(H) 24(H) 33(H)  Creatinine 0.61 - 1.24 mg/dL 1.21 1.26(H)  1.88(H)  Sodium 135 - 145 mmol/L 133(L) 131(L) 134(L)  Potassium 3.5 - 5.1 mmol/L 3.7 4.0 4.1  Chloride 98 - 111 mmol/L 100 101 104  CO2 22 - 32 mmol/L 26 25 24   Calcium 8.9 - 10.3 mg/dL 8.1(L) 8.1(L) 7.7(L)  Total Protein 6.5 - 8.1 g/dL 5.3(L) 5.6(L) 5.6(L)  Total Bilirubin 0.3 - 1.2 mg/dL 0.8 0.7 1.0  Alkaline Phos 38 - 126 U/L 75 69 82  AST 15 - 41 U/L 21 21 59(H)  ALT 0 - 44 U/L 27 41 89(H)   CBC Latest Ref Rng & Units 08/02/2021 08/01/2021 07/31/2021  WBC 4.0 - 10.5 K/uL 10.2 12.6(H) 15.8(H)  Hemoglobin 13.0 - 17.0 g/dL 11.4(L) 11.9(L) 12.3(L)  Hematocrit 39.0 - 52.0 % 35.8(L) 36.7(L) 37.0(L)  Platelets 150 - 400 K/uL 81(L) 76(L) 93(L)    RADS: N/a Assessment:   Left arm cellulitis.  Labs normalized, but arm looking worse.  CT recommended by ID.  Pending.  Will f/u.

## 2021-08-02 NOTE — Progress Notes (Signed)
Patient ID: Ian Mills, male   DOB: January 06, 1956, 66 y.o.   MRN: [id number] [facility] PROGRESS NOTE  Ian Mills:[id number] DOB: 28-Jan-1956 DOA: 07/29/2021 PCP: McLean-Scocuzza, Nino Glow, MD  HPI/Subjective: Patient stated his arm swelling and pain came on all of a sudden.  Patient was admitted by the critical care service with septic shock with strep pyogenes growing out of blood culture.  Patient feeling a little bit better but still having limited motion with his arm.  Objective: Vitals:   08/02/21 0804 08/02/21 1223  BP: (!) 101/57 114/77  Pulse: 73 69  Resp: 16 18  Temp: 97.6 F (36.4 C) 97.7 F (36.5 C)  SpO2: 94% 98%    Intake/Output Summary (Last 24 hours) at 08/02/2021 1310 Last data filed at 08/02/2021 1118 Gross per 24 hour  Intake 1377.15 ml  Output 675 ml  Net 702.15 ml   Filed Weights   07/30/21 0510 07/31/21 0500 08/01/21 0500  Weight: 93.1 kg 92.7 kg 92 kg    ROS: Review of Systems  Respiratory:  Negative for shortness of breath.   Cardiovascular:  Negative for chest pain.  Gastrointestinal:  Negative for abdominal pain, nausea and vomiting.  Exam: Physical Exam HENT:     Head: Normocephalic.     Mouth/Throat:     Pharynx: No oropharyngeal exudate.  Eyes:     General: Lids are normal.     Conjunctiva/sclera: Conjunctivae normal.  Cardiovascular:     Rate and Rhythm: Normal rate and regular rhythm.     Heart sounds: Normal heart sounds, S1 normal and S2 normal.  Pulmonary:     Breath sounds: No decreased breath sounds, wheezing, rhonchi or rales.  Abdominal:     Palpations: Abdomen is soft.     Tenderness: There is no abdominal tenderness.  Musculoskeletal:     Right lower leg: No swelling.     Left lower leg: No swelling.  Skin:    General: Skin is warm.     Comments: Left arm covered.  Neurological:     Mental Status: He is alert and oriented to person, place, and time.      Scheduled Meds:  amiodarone  200 mg Oral Daily    aspirin EC  81 mg Oral Daily   Chlorhexidine Gluconate Cloth  6 each Topical Daily   clopidogrel  75 mg Oral Daily   enoxaparin (LOVENOX) injection  40 mg Subcutaneous QHS   silver sulfADIAZINE   Topical BID   Continuous Infusions:  sodium chloride 10 mL/hr at 07/30/21 1704    ceFAZolin (ANCEF) IV 2 g (08/02/21 0918)   clindamycin (CLEOCIN) IV 600 mg (08/02/21 0524)   lactated ringers 80 mL/hr at 08/02/21 0058   norepinephrine (LEVOPHED) Adult infusion Stopped (08/01/21 9937)    Assessment/Plan:  Septic shock, present on admission with Streptococcus pyogenes growing out of blood cultures and left upper extremity cellulitis.  Patient initially required Levophed to maintain blood pressure.  Levophed discontinued.  Continue Ancef and Cleocin as per infectious disease.  Remove central line and left groin.  General surgery following.  Patient still with limited range of motion of his left arm secondary to swelling. Transaminitis with AST and ALT elevation.  These have normal limits.  Likely secondary to sepsis. Acute kidney injury on chronic kidney disease stage II.  Creatinine 2.61 on presentation and creatinine down to 1.21.  Chronic systolic congestive heart failure with cardiomyopathy.  The patient does have an AICD.  With blood pressure being  better will restart low-dose Coreg this evening.  Will restart Lasix tomorrow.  Continue to hold Aldactone and Entresto at this point. History of CAD.  On aspirin and Plavix.  Restart Lipitor for tomorrow. History of ventricular tachycardia on amiodarone and has AICD. Ambulate patient     Code Status:     Code Status Orders  (From admission, onward)           Start     Ordered   07/29/21 2219  Full code  Continuous        07/29/21 2219           Code Status History     Date Active Date Inactive Code Status Order ID Comments User Context   09/09/2018 1813 09/14/2018 1619 Full Code [id number]  Ivan Anchors, RN Inpatient    09/08/2018 1906 09/09/2018 1813 Partial Code [id number]  Demetrios Loll, MD ED      Family Communication: Family at bedside Disposition Plan: Status is: Inpatient, case discussed with infectious disease  Antibiotics: Ancef and cefazolin  Loletha Grayer  [facility]

## 2021-08-02 NOTE — Progress Notes (Signed)
Patient ID: Ian Mills, male   DOB: [birth year], 65 y.o.   MRN: [id number]  Case discussed with infectious disease and she recommended imaging of the forearm to rule out necrotizing fasciitis.  Spoke with radiologist and they a CT scan without contrast can rule in or rule out necrotizing fasciitis.  Streptococcus pyogenes in blood cultures.  I also notified general surgery Dr. Lysle Pearl who is already following the patient.  Dr Loletha Grayer

## 2021-08-03 ENCOUNTER — Other Ambulatory Visit (HOSPITAL_COMMUNITY): Payer: Self-pay

## 2021-08-03 DIAGNOSIS — B955 Unspecified streptococcus as the cause of diseases classified elsewhere: Secondary | ICD-10-CM

## 2021-08-03 DIAGNOSIS — R7881 Bacteremia: Secondary | ICD-10-CM | POA: Diagnosis not present

## 2021-08-03 DIAGNOSIS — L03114 Cellulitis of left upper limb: Secondary | ICD-10-CM | POA: Diagnosis not present

## 2021-08-03 DIAGNOSIS — I5023 Acute on chronic systolic (congestive) heart failure: Secondary | ICD-10-CM

## 2021-08-03 DIAGNOSIS — A491 Streptococcal infection, unspecified site: Secondary | ICD-10-CM | POA: Diagnosis not present

## 2021-08-03 MED ORDER — IPRATROPIUM-ALBUTEROL 0.5-2.5 (3) MG/3ML IN SOLN
3.0000 mL | Freq: Four times a day (QID) | RESPIRATORY_TRACT | Status: DC
  Administered 2021-08-03 – 2021-08-04 (×2): 3 mL via RESPIRATORY_TRACT
  Filled 2021-08-03 (×2): qty 3

## 2021-08-03 MED ORDER — LINEZOLID 600 MG PO TABS
600.0000 mg | ORAL_TABLET | Freq: Two times a day (BID) | ORAL | Status: DC
  Administered 2021-08-03 – 2021-08-07 (×8): 600 mg via ORAL
  Filled 2021-08-03 (×9): qty 1

## 2021-08-03 MED ORDER — TORSEMIDE 20 MG PO TABS: 20.0000 mg | ORAL_TABLET | Freq: Every day | ORAL | Status: DC

## 2021-08-03 MED ORDER — FUROSEMIDE 10 MG/ML IJ SOLN
40.0000 mg | Freq: Once | INTRAMUSCULAR | Status: AC
  Administered 2021-08-03: 11:00:00 40 mg via INTRAVENOUS
  Filled 2021-08-03: qty 4

## 2021-08-03 MED ORDER — FUROSEMIDE 40 MG PO TABS
40.0000 mg | ORAL_TABLET | Freq: Two times a day (BID) | ORAL | Status: DC
  Administered 2021-08-03: 40 mg via ORAL
  Filled 2021-08-03 (×2): qty 1

## 2021-08-03 MED ORDER — POTASSIUM CHLORIDE CRYS ER 20 MEQ PO TBCR
20.0000 meq | EXTENDED_RELEASE_TABLET | Freq: Two times a day (BID) | ORAL | Status: DC
  Administered 2021-08-03 – 2021-08-07 (×9): 20 meq via ORAL
  Filled 2021-08-03 (×9): qty 1

## 2021-08-03 NOTE — Progress Notes (Signed)
ID Feeling a little better Arm is less painful Started lasix for fluid in lungs   O/E awake and alert- no distress Vitals  Patient Vitals for the past 24 hrs:  BP Temp Temp src Pulse Resp SpO2  08/03/21 1125 119/67 98.4 F (36.9 C) Oral 66 18 96 %  08/03/21 0758 122/65 97.9 F (36.6 C) Oral 72 16 92 %  08/03/21 0406 121/68 97.8 F (36.6 C) Oral 73 17 91 %  08/03/21 0021 (!) 104/53 99.4 F (37.4 C) Oral 72 16 91 %  08/02/21 1951 121/64 98.3 F (36.8 C) Oral 73 16 94 %  08/02/21 1616 125/65 98.4 F (36.9 C) Oral 70 16 94 %    Chest b/l air entry Few rhonchi Cre[place] bases Hs1s2  And soft Left arm        CNS non focal  Labs CBC Latest Ref Rng & Units 08/02/2021 08/01/2021 07/31/2021  WBC 4.0 - 10.5 K/uL 10.2 12.6(H) 15.8(H)  Hemoglobin 13.0 - 17.0 g/dL 11.4(L) 11.9(L) 12.3(L)  Hematocrit 39.0 - 52.0 % 35.8(L) 36.7(L) 37.0(L)  Platelets 150 - 400 K/uL 81(L) 76(L) 93(L)     CMP Latest Ref Rng & Units 08/02/2021 08/01/2021 07/31/2021  Glucose 70 - 99 mg/dL 74 112(H) 117(H)  BUN 8 - 23 mg/dL 24(H) 24(H) 33(H)  Creatinine 0.61 - 1.24 mg/dL 1.21 1.26(H) 1.88(H)  Sodium 135 - 145 mmol/L 133(L) 131(L) 134(L)  Potassium 3.5 - 5.1 mmol/L 3.7 4.0 4.1  Chloride 98 - 111 mmol/L 100 101 104  CO2 22 - 32 mmol/L 26 25 24   Calcium 8.9 - 10.3 mg/dL 8.1(L) 8.1(L) 7.7(L)  Total Protein 6.5 - 8.1 g/dL 5.3(L) 5.6(L) 5.6(L)  Total Bilirubin 0.3 - 1.2 mg/dL 0.8 0.7 1.0  Alkaline Phos 38 - 126 U/L 75 69 82  AST 15 - 41 U/L 21 21 59(H)  ALT 0 - 44 U/L 27 41 89(H)    Ct forearm extensive subcutaneous edema circumferentially involving the forearm, but more severely superficial to the posterior compartment and lateral to the ulna. No subcutaneous loculated fluid collection, however, is identified. There is no subcutaneous gas identified.  Impression/recommendation Streptococcus pyogenes bacteremia  Left arm cellulitis/ soft tissue infection with superficial necrosis CT imaging no air or gas or  abscess On  cefazolin+ clindamycin - will change to PO linezolid 600mg  Q 12 because of fluid overload    Sepsis with shock due to the above - has resolved   AKI on CKD- improved Thrombocytopenia due to infection - stable   Transaminitis due to septic shock- resolved   CAD    Ischemic cardiomyopathy /AICD CHF - started IV lasix  Discussed the management with patient and Dr.Wieting

## 2021-08-03 NOTE — TOC Benefit Eligibility Note (Signed)
Patient Advocate Encounter   Received notification that prior authorization for linezolid (Zyvox) 600 mg tablets is required.   PA submitted on 08/03/2021 Key [id number] Status is pending       Lyndel Safe, [place] Patient Advocate Specialist [place] Patient Advocate Team Direct Number: [phone]  Fax: [phone]

## 2021-08-03 NOTE — TOC Benefit Eligibility Note (Signed)
Patient Teacher, English as a foreign language completed.    The patient is currently admitted and upon discharge could be taking linezolid (Zyvox) 600 mg tablets.  Prior Authorization Required  The patient is insured through [place] Medicare Part D.    Lyndel Safe, [name] Patient Advocate Specialist [place] Patient Advocate Team Direct Number: [phone]  Fax: [phone]

## 2021-08-03 NOTE — TOC Progression Note (Signed)
Transition of Care [facility]) - Progression Note    Patient Details  Name: Ian Mills MRN: [id number] Date of Birth: 1955/07/30  Transition of Care [facility]) CM/SW [place], RN Phone Number: 08/03/2021, 3:39 PM  Clinical Narrative:   Patient in room, spouse at bedside.  Arm red, very edematous and elevated.  Patient states he is not typically ill, woke up like this on Sunday.  He is typically independent at home.  Patient and spouse do not believe they will have TOC needs.  Patient is comfortable returning home at discharge (when medically appropriate).  TOC will monitor patient for home needs .  TOC contact information provided.         Expected Discharge Plan and Services                                                 Social Determinants of Health (SDOH) Interventions    Readmission Risk Interventions Readmission Risk Prevention Plan 08/01/2021  Transportation Screening Complete  PCP or Specialist Appt within 5-7 Days Complete  Home Care Screening Complete  Medication Review (RN CM) Complete  Some recent data might be hidden

## 2021-08-03 NOTE — Progress Notes (Signed)
Patient ID: Ian Mills, male   DOB: 1956-07-22, 66 y.o.   MRN: [id number] [facility] PROGRESS NOTE  [place] CURRENT JOI:[id number] DOB: Jan 27, 1956 DOA: 07/29/2021 PCP: McLean-Scocuzza, Nino Glow, MD  HPI/Subjective: Patient still having a lot of pain and swelling in his left arm.  Still unable to make a fist.  Limited flexion with his wrist.  Able to bend a little bit better at his elbow.  Admitted with septic shock and Streptococcus pyogenes growing out of the blood cultures.  Patient having little shortness of breath and some wheeze today  Objective: Vitals:   08/03/21 0758 08/03/21 1125  BP: 122/65 119/67  Pulse: 72 66  Resp: 16 18  Temp: 97.9 F (36.6 C) 98.4 F (36.9 C)  SpO2: 92% 96%    Intake/Output Summary (Last 24 hours) at 08/03/2021 1347 Last data filed at 08/03/2021 0900 Gross per 24 hour  Intake 470 ml  Output 650 ml  Net -180 ml   Filed Weights   07/30/21 0510 07/31/21 0500 08/01/21 0500  Weight: 93.1 kg 92.7 kg 92 kg    ROS: Review of Systems  Respiratory:  Positive for cough and shortness of breath.   Cardiovascular:  Negative for chest pain.  Gastrointestinal:  Negative for abdominal pain, nausea and vomiting.  Musculoskeletal:  Positive for back pain.  Exam: Physical Exam HENT:     Head: Normocephalic.     Mouth/Throat:     Pharynx: No oropharyngeal exudate.  Eyes:     General: Lids are normal.     Conjunctiva/sclera: Conjunctivae normal.  Cardiovascular:     Rate and Rhythm: Normal rate and regular rhythm.     Heart sounds: Normal heart sounds, S1 normal and S2 normal.  Pulmonary:     Breath sounds: Examination of the right-lower field reveals decreased breath sounds and wheezing. Examination of the left-lower field reveals decreased breath sounds and wheezing. Decreased breath sounds and wheezing present. No rhonchi or rales.  Abdominal:     Palpations: Abdomen is soft.     Tenderness: There is no abdominal tenderness.  Musculoskeletal:      Left elbow: Swelling present. Decreased range of motion.     Left wrist: Swelling present. Decreased range of motion.     Left hand: Swelling present. Decreased range of motion.     Right lower leg: No swelling.     Left lower leg: No swelling.     Comments: Patient unable to make a fist.  Skin:    General: Skin is warm.     Comments: Left arm covered.  Neurological:     Mental Status: He is alert and oriented to person, place, and time.      Scheduled Meds:  amiodarone  200 mg Oral Daily   aspirin EC  81 mg Oral Daily   atorvastatin  80 mg Oral Daily   carvedilol  3.125 mg Oral BID   Chlorhexidine Gluconate Cloth  6 each Topical Daily   clopidogrel  75 mg Oral Daily   enoxaparin (LOVENOX) injection  40 mg Subcutaneous QHS   ezetimibe  10 mg Oral Daily   furosemide  40 mg Oral BID   ipratropium-albuterol  3 mL Nebulization Q6H   potassium chloride  20 mEq Oral BID   silver sulfADIAZINE   Topical BID   Continuous Infusions:  sodium chloride 10 mL/hr at 07/30/21 1704    ceFAZolin (ANCEF) IV 2 g (08/03/21 1130)   clindamycin (CLEOCIN) IV 900 mg (08/03/21 1331)  Assessment/Plan:  Septic shock, present on admission with Streptococcus pyogenes growing out of blood cultures and left upper extremity cellulitis.  Patient also had acute kidney injury.  Patient initially required Levophed to maintain blood pressure.  Continue Ancef and Cleocin as per infectious disease.  CT scan of the left arm did not show any necrotizing fasciitis but did show a lot of subcutaneous soft tissue edema.  Case discussed with general surgery and they will continue to watch with antibiotics at this time.  Patient still has decreased limited range of motion in his left arm secondary to swelling.  Repeat blood cultures negative for 12 hours. 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  Patient does have an AICD.  Restarted low-dose Coreg yesterday.  We will start IV Lasix this  morning with shortness of breath and wheezing.  Blood pressure is still too low for Aldactone and Entresto at this point. Transaminitis with elevation of AST and ALT on presentation.  These have normalized. Acute kidney injury on chronic kidney disease stage II.  Creatinine 2.61 on presentation down to 1.21 as of yesterday. History of CAD on aspirin and Plavix.  With liver function test normalizing restart Lipitor. History of ventricular tachycardia on amiodarone and also has an AICD. Low back pain from lying in the bed.  Okay to ambulate at this point.        Code Status:     Code Status Orders  (From admission, onward)           Start     Ordered   07/29/21 2219  Full code  Continuous        07/29/21 2219           Code Status History     Date Active Date Inactive Code Status Order ID Comments User Context   09/09/2018 1813 09/14/2018 1619 Full Code [id number]  Ivan Anchors, RN Inpatient   09/08/2018 1906 09/09/2018 1813 Partial Code [id number]  Demetrios Loll, MD ED      Family Communication: Tried to update the patient's wife on the phone Disposition Plan: Status is: Inpatient  Consultants: Infectious disease General surgery  Procedures: Central line left groin and then removal on 08/02/2021  Antibiotics: Ancef and [place]  [facility]

## 2021-08-03 NOTE — Care Management Important Message (Signed)
Important Message  Patient Details  Name: Ian Mills MRN: [id number] Date of Birth: 10-Jul-1956   Medicare Important Message Given:  Yes     [name] 08/03/2021, 11:57 AM

## 2021-08-03 NOTE — Progress Notes (Signed)
Subjective:  CC: Ian Mills is a 66 y.o. male  Hospital stay day 5,   left fore arm cellulitis  HPI: No acute issues.   ROS:  General: Denies weight loss, weight gain, fatigue, fevers, chills, and night sweats. Heart: Denies chest pain, palpitations, racing heart, irregular heartbeat, leg pain or swelling, and decreased activity tolerance. Respiratory: Denies breathing difficulty, shortness of breath, wheezing, cough, and sputum. GI: Denies change in appetite, heartburn, nausea, vomiting, constipation, diarrhea, and blood in stool. GU: Denies difficulty urinating, pain with urinating, urgency, frequency, blood in urine.   Objective:   Temp:  [97.8 F (36.6 C)-99.4 F (37.4 C)] 98.4 F (36.9 C) (01/06 1125) Pulse Rate:  [66-73] 66 (01/06 1125) Resp:  [16-18] 18 (01/06 1125) BP: (104-122)/(53-68) 119/67 (01/06 1125) SpO2:  [91 %-96 %] 96 % (01/06 1125)     Height: 5\' 9"  (175.3 cm) Weight: 92 kg BMI (Calculated): 29.94   Intake/Output this shift:   Intake/Output Summary (Last 24 hours) at 08/03/2021 1628 Last data filed at 08/03/2021 1300 Gross per 24 hour  Intake 590 ml  Output 650 ml  Net -60 ml    Constitutional :  alert, cooperative, appears stated age, and no distress  Respiratory:  clear to auscultation bilaterally  Cardiovascular:  regular rate and rhythm  Gastrointestinal: soft, non-tender; bowel sounds normal; no masses,  no organomegaly.   Skin: Cool and moist. Left forearm with decreasing swelling, erythema extending beyond elbow receding.  Blistering still persistent with serous drainage, remains superficial with some exudative buidup.  Neurovascular intact, TTP but still no sign of fluctuance. Overall swelling in hand improving as well.  Psychiatric: Normal affect, non-agitated, not confused       LABS:  CMP Latest Ref Rng & Units 08/02/2021 08/01/2021 07/31/2021  Glucose 70 - 99 mg/dL 74 112(H) 117(H)  BUN 8 - 23 mg/dL 24(H) 24(H) 33(H)  Creatinine 0.61 - 1.24  mg/dL 1.21 1.26(H) 1.88(H)  Sodium 135 - 145 mmol/L 133(L) 131(L) 134(L)  Potassium 3.5 - 5.1 mmol/L 3.7 4.0 4.1  Chloride 98 - 111 mmol/L 100 101 104  CO2 22 - 32 mmol/L 26 25 24   Calcium 8.9 - 10.3 mg/dL 8.1(L) 8.1(L) 7.7(L)  Total Protein 6.5 - 8.1 g/dL 5.3(L) 5.6(L) 5.6(L)  Total Bilirubin 0.3 - 1.2 mg/dL 0.8 0.7 1.0  Alkaline Phos 38 - 126 U/L 75 69 82  AST 15 - 41 U/L 21 21 59(H)  ALT 0 - 44 U/L 27 41 89(H)   CBC Latest Ref Rng & Units 08/02/2021 08/01/2021 07/31/2021  WBC 4.0 - 10.5 K/uL 10.2 12.6(H) 15.8(H)  Hemoglobin 13.0 - 17.0 g/dL 11.4(L) 11.9(L) 12.3(L)  Hematocrit 39.0 - 52.0 % 35.8(L) 36.7(L) 37.0(L)  Platelets 150 - 400 K/uL 81(L) 76(L) 93(L)    RADS: CLINICAL DATA:  Cellulitis, rule out necrotizing fasciitis   EXAM: CT OF THE LEFT FOREARM WITHOUT CONTRAST   TECHNIQUE: Multidetector CT imaging was performed according to the standard protocol. Multiplanar CT image reconstructions were also generated.   COMPARISON:  None.   FINDINGS: Bones/Joint/Cartilage   Normal alignment. No acute fracture or dislocation. 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 Mild degenerative enthesopathy involving the insertion of the common flexor tendon upon the medial epicondyle. No erosions or abnormal periosteal reaction   Ligaments   Suboptimally assessed by CT.   Muscles and Tendons   Unremarkable   Soft tissues   There is extensive subcutaneous edema circumferentially involving the  forearm, but more severely superficial to the posterior compartment and lateral to the ulna. No subcutaneous loculated fluid collection, however, is identified. There is no subcutaneous gas identified.   IMPRESSION: Extensive subcutaneous edema in keeping with the given history of soft tissue infection seen throughout the visualized forearm extending to the elbow. No loculated subcutaneous or intramuscular fluid  collection identified. No focal erosions identified.     Electronically Signed   By: Fidela Salisbury M.D.   On: 08/02/2021 21:08 Assessment:   Left arm cellulitis.  Labs WNL, CT as above.  Continue current management.

## 2021-08-03 NOTE — TOC Benefit Eligibility Note (Signed)
Patient Advocate Encounter  Prior Authorization for Linezolid (Zyvox) 600 mg tablets has been approved.    PA# [id number] Effective dates: 08/03/2021 through 08/31/2021  Patients co-pay is $588.07 due to a $505.00 deductible.     Lyndel Safe, [place] Patient Advocate Specialist [place] Patient Advocate Team Direct Number: [phone]  Fax: [phone]

## 2021-08-04 DIAGNOSIS — D696 Thrombocytopenia, unspecified: Secondary | ICD-10-CM

## 2021-08-04 DIAGNOSIS — L03114 Cellulitis of left upper limb: Secondary | ICD-10-CM | POA: Diagnosis not present

## 2021-08-04 DIAGNOSIS — A400 Sepsis due to streptococcus, group A: Secondary | ICD-10-CM | POA: Diagnosis not present

## 2021-08-04 LAB — BASIC METABOLIC PANEL
Anion gap: 8 (ref 5–15)
BUN: 17 mg/dL (ref 8–23)
CO2: 26 mmol/L (ref 22–32)
Calcium: 8.1 mg/dL — ABNORMAL LOW (ref 8.9–10.3)
Chloride: 99 mmol/L (ref 98–111)
Creatinine, Ser: 0.99 mg/dL (ref 0.61–1.24)
GFR, Estimated: >60 mL/min (ref >60)
Glucose, Bld: 93 mg/dL (ref 70–99)
Potassium: 4 mmol/L (ref 3.5–5.1)
Sodium: 133 mmol/L — ABNORMAL LOW (ref 135–145)

## 2021-08-04 LAB — CBC
HCT: 31.7 % — ABNORMAL LOW (ref 39.0–52.0)
Hemoglobin: 10.8 g/dL — ABNORMAL LOW (ref 13.0–17.0)
MCH: 30.3 pg (ref 26.0–34.0)
MCHC: 34.1 g/dL (ref 30.0–36.0)
MCV: 89 fL (ref 80.0–100.0)
Platelets: 136 10*3/uL — ABNORMAL LOW (ref 150–400)
RBC: 3.56 MIL/uL — ABNORMAL LOW (ref 4.22–5.81)
RDW: 14 % (ref 11.5–15.5)
WBC: 7.4 10*3/uL (ref 4.0–10.5)
nRBC: 0 % (ref 0.0–0.2)

## 2021-08-04 MED ORDER — TORSEMIDE 20 MG PO TABS
20.0000 mg | ORAL_TABLET | Freq: Every day | ORAL | Status: DC
  Administered 2021-08-04 – 2021-08-05 (×2): 20 mg via ORAL
  Filled 2021-08-04 (×2): qty 1

## 2021-08-04 MED ORDER — TIOTROPIUM BROMIDE MONOHYDRATE 18 MCG IN CAPS
18.0000 ug | ORAL_CAPSULE | Freq: Every day | RESPIRATORY_TRACT | Status: DC
  Administered 2021-08-04 – 2021-08-07 (×4): 18 ug via RESPIRATORY_TRACT
  Filled 2021-08-04: qty 5

## 2021-08-04 NOTE — Progress Notes (Signed)
Subjective:  CC: Ian Mills is a 66 y.o. male  Hospital stay day 6,   left fore arm cellulitis  HPI: No acute issues.   ROS:  General: Denies weight loss, weight gain, fatigue, fevers, chills, and night sweats. Heart: Denies chest pain, palpitations, racing heart, irregular heartbeat, leg pain or swelling, and decreased activity tolerance. Respiratory: Denies breathing difficulty, shortness of breath, wheezing, cough, and sputum. GI: Denies change in appetite, heartburn, nausea, vomiting, constipation, diarrhea, and blood in stool. GU: Denies difficulty urinating, pain with urinating, urgency, frequency, blood in urine.   Objective:   Temp:  [98 F (36.7 C)-98.5 F (36.9 C)] 98.4 F (36.9 C) (01/07 0802) Pulse Rate:  [63-72] 69 (01/07 0802) Resp:  [16-20] 20 (01/07 0802) BP: (93-137)/(37-73) 137/73 (01/07 0802) SpO2:  [94 %-100 %] 94 % (01/07 0802)     Height: 5\' 9"  (175.3 cm) Weight: 92 kg BMI (Calculated): 29.94   Intake/Output this shift:   Intake/Output Summary (Last 24 hours) at 08/04/2021 1118 Last data filed at 08/04/2021 1058 Gross per 24 hour  Intake 720 ml  Output 225 ml  Net 495 ml    Constitutional :  alert, cooperative, appears stated age, and no distress  Respiratory:  clear to auscultation bilaterally  Cardiovascular:  regular rate and rhythm  Gastrointestinal: soft, non-tender; bowel sounds normal; no masses,  no organomegaly.   Skin: Cool and moist. Left forearm with decreasing erythema. Swelling starting to be more dependent in upper arm, present in hand.  Blistering still persistent with serous drainage, remains superficial with some exudative buidup.  Neurovascular intact, TTP but still no sign of fluctuance. TTP decreasing  Psychiatric: Normal affect, non-agitated, not confused       LABS:  CMP Latest Ref Rng & Units 08/04/2021 08/02/2021 08/01/2021  Glucose 70 - 99 mg/dL 93 74 112(H)  BUN 8 - 23 mg/dL 17 24(H) 24(H)  Creatinine 0.61 - 1.24 mg/dL 0.99  1.21 1.26(H)  Sodium 135 - 145 mmol/L 133(L) 133(L) 131(L)  Potassium 3.5 - 5.1 mmol/L 4.0 3.7 4.0  Chloride 98 - 111 mmol/L 99 100 101  CO2 22 - 32 mmol/L 26 26 25   Calcium 8.9 - 10.3 mg/dL 8.1(L) 8.1(L) 8.1(L)  Total Protein 6.5 - 8.1 g/dL - 5.3(L) 5.6(L)  Total Bilirubin 0.3 - 1.2 mg/dL - 0.8 0.7  Alkaline Phos 38 - 126 U/L - 75 69  AST 15 - 41 U/L - 21 21  ALT 0 - 44 U/L - 27 41   CBC Latest Ref Rng & Units 08/04/2021 08/02/2021 08/01/2021  WBC 4.0 - 10.5 K/uL 7.4 10.2 12.6(H)  Hemoglobin 13.0 - 17.0 g/dL 10.8(L) 11.4(L) 11.9(L)  Hematocrit 39.0 - 52.0 % 31.7(L) 35.8(L) 36.7(L)  Platelets 150 - 400 K/uL 136(L) 81(L) 76(L)    RADS: CLINICAL DATA:  Cellulitis, rule out necrotizing fasciitis   EXAM: CT OF THE LEFT FOREARM WITHOUT CONTRAST   TECHNIQUE: Multidetector CT imaging was performed according to the standard protocol. Multiplanar CT image reconstructions were also generated.   COMPARISON:  None.   FINDINGS: Bones/Joint/Cartilage   Normal alignment. No acute fracture or dislocation. 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 Mild degenerative enthesopathy involving the insertion of the common flexor tendon upon the medial epicondyle. No erosions or abnormal periosteal reaction   Ligaments   Suboptimally assessed by CT.   Muscles and Tendons   Unremarkable   Soft tissues   There is extensive subcutaneous edema circumferentially  involving the forearm, but more severely superficial to the posterior compartment and lateral to the ulna. No subcutaneous loculated fluid collection, however, is identified. There is no subcutaneous gas identified.   IMPRESSION: Extensive subcutaneous edema in keeping with the given history of soft tissue infection seen throughout the visualized forearm extending to the elbow. No loculated subcutaneous or intramuscular fluid collection identified. No focal  erosions identified.     Electronically Signed   By: Fidela Salisbury M.D.   On: 08/02/2021 21:08 Assessment:   Left arm cellulitis.  Labs remain WNL. Pain improving. Continue current management with local wound care and abx per medical team

## 2021-08-04 NOTE — Progress Notes (Signed)
Ian Mills Feeling  better Was walking in the corridor Says the pain in his left arm makes him walk   O/E awake and alert- no distress Vitals  Patient Vitals for the past 24 hrs:  BP Temp Temp src Pulse Resp SpO2  08/04/21 1551 (!) 103/53 97.9 F (36.6 C) Oral 66 20 95 %  08/04/21 1158 109/63 98.2 F (36.8 C) Oral 70 18 93 %  08/04/21 0802 137/73 98.4 F (36.9 C) Oral 69 20 94 %  08/04/21 0718 -- -- -- -- -- 98 %  08/04/21 0550 (!) 109/56 98.5 F (36.9 C) Oral 63 16 97 %  08/04/21 0011 (!) 93/37 98 F (36.7 C) Oral 72 -- 100 %  08/03/21 2306 -- -- -- -- -- 96 %  08/03/21 1944 (!) 126/56 98.5 F (36.9 C) -- 66 18 96 %    Chest b/l air entry Few rhonchi left base Crepts bases Hs1s2  And soft Left arm  08/04/21   08/03/21       CNS non focal  Labs CBC Latest Ref Rng & Units 08/04/2021 08/02/2021 08/01/2021  WBC 4.0 - 10.5 K/uL 7.4 10.2 12.6(H)  Hemoglobin 13.0 - 17.0 g/dL 10.8(L) 11.4(L) 11.9(L)  Hematocrit 39.0 - 52.0 % 31.7(L) 35.8(L) 36.7(L)  Platelets 150 - 400 K/uL 136(L) 81(L) 76(L)     CMP Latest Ref Rng & Units 08/04/2021 08/02/2021 08/01/2021  Glucose 70 - 99 mg/dL 93 74 112(H)  BUN 8 - 23 mg/dL 17 24(H) 24(H)  Creatinine 0.61 - 1.24 mg/dL 0.99 1.21 1.26(H)  Sodium 135 - 145 mmol/L 133(L) 133(L) 131(L)  Potassium 3.5 - 5.1 mmol/L 4.0 3.7 4.0  Chloride 98 - 111 mmol/L 99 100 101  CO2 22 - 32 mmol/L 26 26 25   Calcium 8.9 - 10.3 mg/dL 8.1(L) 8.1(L) 8.1(L)  Total Protein 6.5 - 8.1 g/dL - 5.3(L) 5.6(L)  Total Bilirubin 0.3 - 1.2 mg/dL - 0.8 0.7  Alkaline Phos 38 - 126 U/L - 75 69  AST 15 - 41 U/L - 21 21  ALT 0 - 44 U/L - 27 41    Ct forearm extensive subcutaneous edema circumferentially involving the forearm, but more severely superficial to the posterior compartment and lateral to the ulna. No subcutaneous loculated fluid collection, however, is identified. There is no subcutaneous gas identified.  Impression/recommendation Streptococcus pyogenes bacteremia  Left  arm cellulitis/ soft tissue infection with superficial necrosis CT imaging no air or gas or abscess Was On  cefazolin+ clindamycin - which was changed  to PO linezolid 600mg  Q 12 because of fluid overload on 08/03/21- slight improvement in arm-  His copay for linezolid is $ 588. Would not recommend any other antibiotic because of severe necrotizing superficial infecion    Sepsis with shock due to the above - has resolved   AKI on CKD- resolved Thrombocytopenia due to infection - improved   Transaminitis due to septic shock- resolved   CAD    Ischemic cardiomyopathy /AICD CHF - started IV lasix  Discussed the management with patient and Dr.Wieting

## 2021-08-04 NOTE — Progress Notes (Signed)
Patient ID: Ian Mills, male   DOB: 26-Nov-1955, 66 y.o.   MRN: [id number] [facility] PROGRESS NOTE  JONNATHAN BIRMAN PPJ:[id number] DOB: Oct 30, 1955 DOA: 07/29/2021 PCP: McLean-Scocuzza, Nino Glow, MD  HPI/Subjective: Patient does not want to do Lasix twice a day.  He would rather just do it once a day.  He states his breathing is better today than yesterday.  His arm still painful and swollen.  Admitted with septic shock secondary to Streptococcus pyogenes  Objective: Vitals:   08/04/21 0718 08/04/21 0802  BP:  137/73  Pulse:  69  Resp:  20  Temp:  98.4 F (36.9 C)  SpO2: 98% 94%    Intake/Output Summary (Last 24 hours) at 08/04/2021 0846 Last data filed at 08/04/2021 0553 Gross per 24 hour  Intake 480 ml  Output 225 ml  Net 255 ml   Filed Weights   07/30/21 0510 07/31/21 0500 08/01/21 0500  Weight: 93.1 kg 92.7 kg 92 kg    ROS: Review of Systems  Respiratory:  Negative for shortness of breath.   Cardiovascular:  Negative for chest pain.  Gastrointestinal:  Negative for abdominal pain, nausea and vomiting.  Exam: Physical Exam HENT:     Head: Normocephalic.     Mouth/Throat:     Pharynx: No oropharyngeal exudate.  Eyes:     General: Lids are normal.     Conjunctiva/sclera: Conjunctivae normal.  Cardiovascular:     Rate and Rhythm: Normal rate and regular rhythm.     Heart sounds: Normal heart sounds, S1 normal and S2 normal.  Pulmonary:     Breath sounds: No decreased breath sounds, wheezing, rhonchi or rales.  Abdominal:     Palpations: Abdomen is soft.     Tenderness: There is no abdominal tenderness.  Musculoskeletal:     Left elbow: Swelling present. Decreased range of motion.     Left forearm: Swelling present.     Left wrist: Swelling present. Decreased range of motion.     Right lower leg: No swelling.     Left lower leg: No swelling.     Comments: Unable to make a fist with his left hand.  Skin:    General: Skin is warm.     Comments: See picture  below.  Neurological:     Mental Status: He is alert and oriented to person, place, and time.         Scheduled Meds:  amiodarone  200 mg Oral Daily   aspirin EC  81 mg Oral Daily   atorvastatin  80 mg Oral Daily   carvedilol  3.125 mg Oral BID   Chlorhexidine Gluconate Cloth  6 each Topical Daily   clopidogrel  75 mg Oral Daily   enoxaparin (LOVENOX) injection  40 mg Subcutaneous QHS   ezetimibe  10 mg Oral Daily   ipratropium-albuterol  3 mL Nebulization Q6H   linezolid  600 mg Oral Q12H   potassium chloride  20 mEq Oral BID   silver sulfADIAZINE   Topical BID   torsemide  20 mg Oral Daily   Continuous Infusions:  sodium chloride 10 mL/hr at 07/30/21 1704    Assessment/Plan:  Septic shock, present on admission with Streptococcus pyogenes growing out of blood cultures and left upper extremity extensive cellulitis.  Initially also had acute kidney injury and thrombocytopenia.  Dr. Steva Ready changed antibiotics over to Zyvox.  Reviewed CT scan of the forearm that showed extensive subcutaneous soft tissue edema.  Repeat blood cultures so far negative.  Patient still has decreased range of motion in his arm secondary to swelling. 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  The patient does have an AICD.  Continue low-dose Coreg.  The patient wanted to do Lasix once a day.  I told him that I will switch him to torsemide which is a once a day medication.  Blood pressure still too low for Aldactone Entresto. Transaminitis with elevation of AST and ALT on presentation.  These have normalized. 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  Creatinine 2.61 on presentation and down to 0.99 today History of CAD on aspirin and Plavix.  We have liver function tests normalizing started back on Lipitor History of ventricular tachycardia on amiodarone.  Also has an AICD. COPD. On Spiriva and nebulizers. Thrombocytopenia.  Platelet count dropped down to 76 on 08/01/2021.   Today's platelet count 136.       Code Status:     Code Status Orders  (From admission, onward)           Start     Ordered   07/29/21 2219  Full code  Continuous        07/29/21 2219           Code Status History     Date Active Date Inactive Code Status Order ID Comments User Context   09/09/2018 1813 09/14/2018 1619 Full Code [id number]  Ivan Anchors, RN Inpatient   09/08/2018 1906 09/09/2018 1813 Partial Code [id number]  Demetrios Loll, MD ED      Family Communication: Spoke with family at bedside Disposition Plan: Status is: Inpatient  Antibiotics: Zyvox  Mirriam Vadala [employer]  [facility]

## 2021-08-05 DIAGNOSIS — J449 Chronic obstructive pulmonary disease, unspecified: Secondary | ICD-10-CM

## 2021-08-05 MED ORDER — TORSEMIDE 20 MG PO TABS
20.0000 mg | ORAL_TABLET | Freq: Once | ORAL | Status: AC
  Administered 2021-08-05: 12:00:00 20 mg via ORAL

## 2021-08-05 MED ORDER — MOMETASONE FURO-FORMOTEROL FUM 200-5 MCG/ACT IN AERO
2.0000 | INHALATION_SPRAY | Freq: Two times a day (BID) | RESPIRATORY_TRACT | Status: DC
  Administered 2021-08-05 – 2021-08-07 (×5): 2 via RESPIRATORY_TRACT
  Filled 2021-08-05: qty 8.8

## 2021-08-05 MED ORDER — TORSEMIDE 20 MG PO TABS
40.0000 mg | ORAL_TABLET | Freq: Every day | ORAL | Status: DC
  Filled 2021-08-05: qty 2

## 2021-08-05 NOTE — Plan of Care (Signed)

## 2021-08-05 NOTE — Progress Notes (Signed)
Patient ID: Ian Mills, male   DOB: October 12, 1955, 66 y.o.   MRN: [id number] [facility] PROGRESS NOTE  TAMIKA NOU UXL:[id number] DOB: 04-15-1956 DOA: 07/29/2021 PCP: McLean-Scocuzza, Nino Glow, MD  HPI/Subjective: Patient able to bend at his left elbow better today than yesterday.  Still swollen on the forearm and wrist and hand.  Still unable to make a fist.  Still having pain on the arm.     Objective: Vitals:   08/05/21 0751 08/05/21 1142  BP: (!) 115/59 118/67  Pulse: 65 69  Resp: 18 18  Temp: (!) 97.4 F (36.3 C) (!) 97.4 F (36.3 C)  SpO2: 94% 98%    Intake/Output Summary (Last 24 hours) at 08/05/2021 1453 Last data filed at 08/05/2021 1045 Gross per 24 hour  Intake 250 ml  Output 0 ml  Net 250 ml   Filed Weights   07/31/21 0500 08/01/21 0500 08/05/21 0608  Weight: 92.7 kg 92 kg 95.6 kg    ROS: Review of Systems  Respiratory:  Negative for shortness of breath.   Cardiovascular:  Negative for chest pain.  Gastrointestinal:  Negative for abdominal pain.  Musculoskeletal:  Positive for joint pain.  Exam: Physical Exam HENT:     Head: Normocephalic.     Mouth/Throat:     Pharynx: No oropharyngeal exudate.  Eyes:     General: Lids are normal.     Conjunctiva/sclera: Conjunctivae normal.  Cardiovascular:     Rate and Rhythm: Normal rate and regular rhythm.     Heart sounds: Normal heart sounds, S1 normal and S2 normal.  Pulmonary:     Breath sounds: Examination of the right-middle field reveals wheezing. Examination of the left-middle field reveals wheezing. Examination of the right-lower field reveals decreased breath sounds and rhonchi. Examination of the left-lower field reveals decreased breath sounds and rhonchi. Decreased breath sounds, wheezing and rhonchi present.  Abdominal:     Palpations: Abdomen is soft.     Tenderness: There is no abdominal tenderness.  Musculoskeletal:     Right lower leg: No swelling.     Left lower leg: No swelling.   Skin:    General: Skin is warm.     Comments: See pictures above.  Neurological:     Mental Status: He is alert and oriented to person, place, and time.      Scheduled Meds:  amiodarone  200 mg Oral Daily   aspirin EC  81 mg Oral Daily   atorvastatin  80 mg Oral Daily   carvedilol  3.125 mg Oral BID   Chlorhexidine Gluconate Cloth  6 each Topical Daily   clopidogrel  75 mg Oral Daily   enoxaparin (LOVENOX) injection  40 mg Subcutaneous QHS   ezetimibe  10 mg Oral Daily   linezolid  600 mg Oral Q12H   mometasone-formoterol  2 puff Inhalation BID   potassium chloride  20 mEq Oral BID   silver sulfADIAZINE   Topical BID   tiotropium  18 mcg Inhalation Daily   torsemide  40 mg Oral Daily   Continuous Infusions:  sodium chloride 10 mL/hr at 07/30/21 1704    Assessment/Plan:  Septic shock, present on admission with Streptococcus pyogenes growing out of blood cultures and left upper extremity extensive cellulitis.  Patient initially had acute kidney injury and thrombocytopenia.  Currently on p.o. Zyvox.  CT scan of the forearm the other day showed extensive subcutaneous soft tissue edema.  Repeat blood cultures negative for 2 days.  Case discussed with  general surgery today about his wound. 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  Continue low-dose Coreg.  Increase torsemide to 40 mg daily.  Blood pressure still too low for Entresto and Aldactone at this point. COPD with wheeze.  On nebulizer treatments.  Spiriva added back yesterday.  Started Dulera inhaler today. Transaminitis with elevation of AST and ALT on presentation.  These have normalized Acute kidney injury on chronic kidney disease stage II.  Creatinine 2.61 on presentation down to 0.99 yesterday Thrombocytopenia.  Platelet count dropped down to 76 at its lowest point and up to 136. History of ventricular tachycardia on amiodarone.  Patient also has an AICD History of CAD on aspirin, Plavix,  Coreg and Lipitor.        Code Status:     Code Status Orders  (From admission, onward)           Start     Ordered   07/29/21 2219  Full code  Continuous        07/29/21 2219           Code Status History     Date Active Date Inactive Code Status Order ID Comments User Context   09/09/2018 1813 09/14/2018 1619 Full Code [id number]  Ivan Anchors, RN Inpatient   09/08/2018 1906 09/09/2018 1813 Partial Code [id number]  Demetrios Loll, MD ED      Family Communication: Updated patient's wife yesterday Disposition Plan: Status is: Inpatient  Antibiotics: Zyvox  Aurther Harlin [employer]  [facility]

## 2021-08-05 NOTE — Progress Notes (Signed)
Subjective:  CC: Ian Mills is a 66 y.o. male  Hospital stay day 7,   left forearm cellulitis  HPI: No acute issues.   ROS:  General: Denies weight loss, weight gain, fatigue, fevers, chills, and night sweats. Heart: Denies chest pain, palpitations, racing heart, irregular heartbeat, leg pain or swelling, and decreased activity tolerance. Respiratory: Denies breathing difficulty, shortness of breath, wheezing, cough, and sputum. GI: Denies change in appetite, heartburn, nausea, vomiting, constipation, diarrhea, and blood in stool. GU: Denies difficulty urinating, pain with urinating, urgency, frequency, blood in urine.   Objective:   Temp:  [97.4 F (36.3 C)-98.2 F (36.8 C)] 97.4 F (36.3 C) (01/08 0751) Pulse Rate:  [65-70] 65 (01/08 0751) Resp:  [16-20] 18 (01/08 0751) BP: (103-137)/(53-72) 115/59 (01/08 0751) SpO2:  [93 %-95 %] 94 % (01/08 0751) Weight:  [95.6 kg] 95.6 kg (01/08 0608)     Height: 5\' 9"  (175.3 cm) Weight: 95.6 kg BMI (Calculated): 31.11   Intake/Output this shift:   Intake/Output Summary (Last 24 hours) at 08/05/2021 1122 Last data filed at 08/05/2021 1045 Gross per 24 hour  Intake 250 ml  Output 0 ml  Net 250 ml    Constitutional :  alert, cooperative, appears stated age, and no distress  Respiratory:  clear to auscultation bilaterally  Cardiovascular:  regular rate and rhythm  Gastrointestinal: soft, non-tender; bowel sounds normal; no masses,  no organomegaly.   Skin: Cool and moist. Left forearm with continued decreasing erythema. Swelling starting to be more dependent in upper arm, present in hand, improved again in upperarm.  Blistering still persistent with serous drainage, remains superficial with some exudative buidup. Darker discoloration still present. Neurovascular intact, TTP but still no sign of fluctuance. TTP decreasing  Psychiatric: Normal affect, non-agitated, not confused       LABS:  CMP Latest Ref Rng & Units 08/04/2021 08/02/2021  08/01/2021  Glucose 70 - 99 mg/dL 93 74 112(H)  BUN 8 - 23 mg/dL 17 24(H) 24(H)  Creatinine 0.61 - 1.24 mg/dL 0.99 1.21 1.26(H)  Sodium 135 - 145 mmol/L 133(L) 133(L) 131(L)  Potassium 3.5 - 5.1 mmol/L 4.0 3.7 4.0  Chloride 98 - 111 mmol/L 99 100 101  CO2 22 - 32 mmol/L 26 26 25   Calcium 8.9 - 10.3 mg/dL 8.1(L) 8.1(L) 8.1(L)  Total Protein 6.5 - 8.1 g/dL - 5.3(L) 5.6(L)  Total Bilirubin 0.3 - 1.2 mg/dL - 0.8 0.7  Alkaline Phos 38 - 126 U/L - 75 69  AST 15 - 41 U/L - 21 21  ALT 0 - 44 U/L - 27 41   CBC Latest Ref Rng & Units 08/04/2021 08/02/2021 08/01/2021  WBC 4.0 - 10.5 K/uL 7.4 10.2 12.6(H)  Hemoglobin 13.0 - 17.0 g/dL 10.8(L) 11.4(L) 11.9(L)  Hematocrit 39.0 - 52.0 % 31.7(L) 35.8(L) 36.7(L)  Platelets 150 - 400 K/uL 136(L) 81(L) 76(L)    RADS: CLINICAL DATA:  Cellulitis, rule out necrotizing fasciitis   EXAM: CT OF THE LEFT FOREARM WITHOUT CONTRAST   TECHNIQUE: Multidetector CT imaging was performed according to the standard protocol. Multiplanar CT image reconstructions were also generated.   COMPARISON:  None.   FINDINGS: Bones/Joint/Cartilage   Normal alignment. No acute fracture or dislocation. 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 Mild degenerative enthesopathy involving the insertion of the common flexor tendon upon the medial epicondyle. No erosions or abnormal periosteal reaction   Ligaments   Suboptimally assessed by CT.   Muscles and Tendons  Unremarkable   Soft tissues   There is extensive subcutaneous edema circumferentially involving the forearm, but more severely superficial to the posterior compartment and lateral to the ulna. No subcutaneous loculated fluid collection, however, is identified. There is no subcutaneous gas identified.   IMPRESSION: Extensive subcutaneous edema in keeping with the given history of soft tissue infection seen throughout the visualized  forearm extending to the elbow. No loculated subcutaneous or intramuscular fluid collection identified. No focal erosions identified.     Electronically Signed   By: Fidela Salisbury M.D.   On: 08/02/2021 21:08 Assessment:   Left arm cellulitis.  Labs remain WNL. Overall appearance starting to plateau again.  Unsure if debridement of the superficial blisters and discolored skin will be any benefit, with wound care post op complicated by plavix use.  Will monitor again for a day and reassess in am.

## 2021-08-06 ENCOUNTER — Other Ambulatory Visit (HOSPITAL_COMMUNITY): Payer: Self-pay

## 2021-08-06 ENCOUNTER — Telehealth: Payer: Self-pay | Admitting: Internal Medicine

## 2021-08-06 ENCOUNTER — Other Ambulatory Visit: Payer: Self-pay

## 2021-08-06 DIAGNOSIS — M7989 Other specified soft tissue disorders: Secondary | ICD-10-CM

## 2021-08-06 DIAGNOSIS — L03114 Cellulitis of left upper limb: Secondary | ICD-10-CM | POA: Diagnosis not present

## 2021-08-06 DIAGNOSIS — R7881 Bacteremia: Secondary | ICD-10-CM | POA: Diagnosis not present

## 2021-08-06 DIAGNOSIS — A491 Streptococcal infection, unspecified site: Secondary | ICD-10-CM | POA: Diagnosis not present

## 2021-08-06 LAB — BASIC METABOLIC PANEL
Anion gap: 7 (ref 5–15)
BUN: 20 mg/dL (ref 8–23)
CO2: 30 mmol/L (ref 22–32)
Calcium: 8.2 mg/dL — ABNORMAL LOW (ref 8.9–10.3)
Chloride: 97 mmol/L — ABNORMAL LOW (ref 98–111)
Creatinine, Ser: 1.3 mg/dL — ABNORMAL HIGH (ref 0.61–1.24)
GFR, Estimated: >60 mL/min (ref >60)
Glucose, Bld: 106 mg/dL — ABNORMAL HIGH (ref 70–99)
Potassium: 4 mmol/L (ref 3.5–5.1)
Sodium: 134 mmol/L — ABNORMAL LOW (ref 135–145)

## 2021-08-06 LAB — CBC
HCT: 34.2 % — ABNORMAL LOW (ref 39.0–52.0)
Hemoglobin: 11.4 g/dL — ABNORMAL LOW (ref 13.0–17.0)
MCH: 29.4 pg (ref 26.0–34.0)
MCHC: 33.3 g/dL (ref 30.0–36.0)
MCV: 88.1 fL (ref 80.0–100.0)
Platelets: 194 10*3/uL (ref 150–400)
RBC: 3.88 MIL/uL — ABNORMAL LOW (ref 4.22–5.81)
RDW: 14 % (ref 11.5–15.5)
WBC: 7.3 10*3/uL (ref 4.0–10.5)
nRBC: 0 % (ref 0.0–0.2)

## 2021-08-06 MED ORDER — TORSEMIDE 20 MG PO TABS
20.0000 mg | ORAL_TABLET | Freq: Every day | ORAL | Status: DC
  Administered 2021-08-07: 20 mg via ORAL
  Filled 2021-08-06: qty 1

## 2021-08-06 NOTE — Progress Notes (Addendum)
Subjective:  CC: Ian Mills is a 66 y.o. male  Hospital stay day 8,   left forearm cellulitis  HPI: No acute issues.   ROS:  General: Denies weight loss, weight gain, fatigue, fevers, chills, and night sweats. Heart: Denies chest pain, palpitations, racing heart, irregular heartbeat, leg pain or swelling, and decreased activity tolerance. Respiratory: Denies breathing difficulty, shortness of breath, wheezing, cough, and sputum. GI: Denies change in appetite, heartburn, nausea, vomiting, constipation, diarrhea, and blood in stool. GU: Denies difficulty urinating, pain with urinating, urgency, frequency, blood in urine.   Objective:   Temp:  [97.4 F (36.3 C)-98 F (36.7 C)] 97.5 F (36.4 C) (01/09 0726) Pulse Rate:  [64-69] 64 (01/09 0726) Resp:  [16-18] 16 (01/09 0436) BP: (115-135)/(63-71) 135/63 (01/09 0726) SpO2:  [94 %-99 %] 94 % (01/09 0726)     Height: 5\' 9"  (175.3 cm) Weight: 95.6 kg BMI (Calculated): 31.11   Intake/Output this shift:   Intake/Output Summary (Last 24 hours) at 08/06/2021 0933 Last data filed at 08/05/2021 2100 Gross per 24 hour  Intake 390 ml  Output 800 ml  Net -410 ml    Constitutional :  alert, cooperative, appears stated age, and no distress  Respiratory:  clear to auscultation bilaterally  Cardiovascular:  regular rate and rhythm  Gastrointestinal: soft, non-tender; bowel sounds normal; no masses,  no organomegaly.   Skin: Cool and moist. Left forearm with continued decreasing erythema. Swelling continues to improve.  Central area remains superficial with some exudative buidup. Darker discoloration still present. See pic below. Neurovascular intact, TTP but still no sign of fluctuance. TTP decreasing  Psychiatric: Normal affect, non-agitated, not confused        LABS:  CMP Latest Ref Rng & Units 08/06/2021 08/04/2021 08/02/2021  Glucose 70 - 99 mg/dL 106(H) 93 74  BUN 8 - 23 mg/dL 20 17 24(H)  Creatinine 0.61 - 1.24 mg/dL 1.30(H) 0.99 1.21   Sodium 135 - 145 mmol/L 134(L) 133(L) 133(L)  Potassium 3.5 - 5.1 mmol/L 4.0 4.0 3.7  Chloride 98 - 111 mmol/L 97(L) 99 100  CO2 22 - 32 mmol/L 30 26 26   Calcium 8.9 - 10.3 mg/dL 8.2(L) 8.1(L) 8.1(L)  Total Protein 6.5 - 8.1 g/dL - - 5.3(L)  Total Bilirubin 0.3 - 1.2 mg/dL - - 0.8  Alkaline Phos 38 - 126 U/L - - 75  AST 15 - 41 U/L - - 21  ALT 0 - 44 U/L - - 27   CBC Latest Ref Rng & Units 08/06/2021 08/04/2021 08/02/2021  WBC 4.0 - 10.5 K/uL 7.3 7.4 10.2  Hemoglobin 13.0 - 17.0 g/dL 11.4(L) 10.8(L) 11.4(L)  Hematocrit 39.0 - 52.0 % 34.2(L) 31.7(L) 35.8(L)  Platelets 150 - 400 K/uL 194 136(L) 81(L)    RADS: N/a  Assessment:   Left arm cellulitis.  Labs remain WNL. Overall swelling and erythema slowly improving again. Will like to hold off on debridement until erythema resovle as much as possible, since graft maybe an option at this point if the darker area does not improve.  Will like to improve graft take as much as possible by minimizing infectious risk.  Abx per ID.

## 2021-08-06 NOTE — Progress Notes (Signed)
Patient ID: Ian Mills, male   DOB: 06-17-1956, 66 y.o.   MRN: [id number] [facility] PROGRESS NOTE  Ian Mills OZH:[id number] DOB: 1955/09/06 DOA: 07/29/2021 PCP: McLean-Scocuzza, Nino Glow, MD  HPI/Subjective: Patient still able to bend his elbow better today.  Still swollen on the arm wrist and hand.  Still unable to make a fist.  Still having a lot of pain in his arm.  Not sleeping very well.  Objective: Vitals:   08/06/21 0726 08/06/21 1112  BP: 135/63 (!) 143/89  Pulse: 64 66  Resp:  16  Temp: (!) 97.5 F (36.4 C) 97.8 F (36.6 C)  SpO2: 94% 99%    Intake/Output Summary (Last 24 hours) at 08/06/2021 1400 Last data filed at 08/06/2021 0900 Gross per 24 hour  Intake 270 ml  Output 800 ml  Net -530 ml   Filed Weights   07/31/21 0500 08/01/21 0500 08/05/21 0608  Weight: 92.7 kg 92 kg 95.6 kg    ROS: Review of Systems  Respiratory:  Negative for shortness of breath.   Cardiovascular:  Negative for chest pain.  Gastrointestinal:  Negative for abdominal pain, nausea and vomiting.  Exam: Physical Exam HENT:     Head: Normocephalic.     Mouth/Throat:     Pharynx: No oropharyngeal exudate.  Eyes:     General: Lids are normal.     Conjunctiva/sclera: Conjunctivae normal.  Cardiovascular:     Rate and Rhythm: Normal rate and regular rhythm.     Heart sounds: Normal heart sounds, S1 normal and S2 normal.  Pulmonary:     Breath sounds: No decreased breath sounds, wheezing, rhonchi or rales.  Abdominal:     Palpations: Abdomen is soft.     Tenderness: There is no abdominal tenderness.  Musculoskeletal:     Right lower leg: No swelling.     Left lower leg: No swelling.  Skin:    General: Skin is warm.     Comments: See picture below  Neurological:     Mental Status: He is alert and oriented to person, place, and time.       Scheduled Meds:  amiodarone  200 mg Oral Daily   aspirin EC  81 mg Oral Daily   atorvastatin  80 mg Oral Daily   carvedilol  3.125  mg Oral BID   Chlorhexidine Gluconate Cloth  6 each Topical Daily   clopidogrel  75 mg Oral Daily   enoxaparin (LOVENOX) injection  40 mg Subcutaneous QHS   ezetimibe  10 mg Oral Daily   linezolid  600 mg Oral Q12H   mometasone-formoterol  2 puff Inhalation BID   potassium chloride  20 mEq Oral BID   silver sulfADIAZINE   Topical BID   tiotropium  18 mcg Inhalation Daily   [START ON 08/07/2021] torsemide  20 mg Oral Daily   Continuous Infusions:  sodium chloride 10 mL/hr at 07/30/21 1704    Assessment/Plan:  Septic shock, present on admission with Streptococcus pyogenes growing out of blood cultures and left upper extremity extensive cellulitis.  Patient also had acute kidney injury and thrombocytopenia.  Currently on p.o. Zyvox.  Patient still has blackish material on his left arm.  Still having some drainage.  Continue to monitor here.  We will discuss case with infectious disease specialist after she examines the patient.  Case discussed with general surgery this morning. Acute kidney injury on CKD stage II.  Creatinine 2.61 on presentation went down to 0.99.  Creatinine up to  1.3 with overdiuresis.  Hold torsemide today and recheck kidney function tomorrow. 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  On low-dose Coreg.  Holding torsemide with acute kidney injury.  Continue to hold Entresto and Aldactone at this point. Transaminitis with elevation of AST and ALT on presentation.  Patient normalized Thrombocytopenia.  Platelet count dropped down to 76 at its lowest point.  This has improved up to 194. COPD.  Added Dulera inhaler yesterday on Spiriva.  No wheezing heard today History of ventricular tachycardia on amiodarone.  Patient has AICD. History of CAD on aspirin, Plavix Coreg and Lipitor        Code Status:     Code Status Orders  (From admission, onward)           Start     Ordered   07/29/21 2219  Full code  Continuous        07/29/21  2219           Code Status History     Date Active Date Inactive Code Status Order ID Comments User Context   09/09/2018 1813 09/14/2018 1619 Full Code [id number]  Ivan Anchors, RN Inpatient   09/08/2018 1906 09/09/2018 1813 Partial Code [id number]  Demetrios Loll, MD ED      Disposition Plan: Status is: Inpatient  [facility]  [facility]

## 2021-08-06 NOTE — Telephone Encounter (Signed)
Patient currently admitted. Called him, he is concerned about the cost of linezolid.   Per CPhT documentation:  PA# [id number] Effective dates: 08/03/2021 through 08/31/2021   Patients co-pay is $588.07 due to a $505.00 deductible.   Will collaborate with inpatient team to determine solutions

## 2021-08-06 NOTE — Telephone Encounter (Signed)
Patient called in stated he is in the hospital and been in the hospital for a week. Patient would like Catie or Dr Olivia Mackie nurse  to call him concerning medication that he is currently taking . Patient could not tell me the name of meds but the cost is high and the Docter at [facility] may take patient off of it . Please call patient @ [phone]

## 2021-08-06 NOTE — TOC Progression Note (Signed)
Transition of Care [facility]) - Progression Note    Patient Details  Name: Ian Mills MRN: [id number] Date of Birth: December 30, 1955  [facility]) CM/SW [place], RN Phone Number: 08/06/2021, 4:40 PM  Clinical Narrative:  Patient may discharge home tomorrow with antibiotics, pharmacy currently working on regimen and pricing (affordability)  TOC to foillow          Expected Discharge Plan and Services                                                 Social Determinants of Health (SDOH) Interventions    Readmission Risk Interventions Readmission Risk Prevention Plan 08/01/2021  Transportation Screening Complete  PCP or Specialist Appt within 5-7 Days Complete  Home Care Screening Complete  Medication Review (RN CM) Complete  Some recent data might be hidden

## 2021-08-06 NOTE — Progress Notes (Signed)
ID Feeling  better Pain left arm better No fever No SOB   O/E awake and alert- no distress Vitals  Patient Vitals for the past 24 hrs:  BP Temp Temp src Pulse Resp SpO2  08/06/21 0726 135/63 (!) 97.5 F (36.4 C) Oral 64 -- 94 %  08/06/21 0436 133/71 98 F (36.7 C) Oral 64 16 97 %  08/06/21 0043 132/66 (!) 97.4 F (36.3 C) Oral 64 16 95 %  08/05/21 2105 127/70 97.7 F (36.5 C) Oral 65 17 95 %  08/05/21 1811 115/64 98 F (36.7 C) -- 64 18 99 %  08/05/21 1142 118/67 (!) 97.4 F (36.3 C) Oral 69 18 98 %    Chest b/l air entry [id number]  And soft Left arm  08/04/21   08/06/21    CNS non focal  Labs CBC Latest Ref Rng & Units 08/06/2021 08/04/2021 08/02/2021  WBC 4.0 - 10.5 K/uL 7.3 7.4 10.2  Hemoglobin 13.0 - 17.0 g/dL 11.4(L) 10.8(L) 11.4(L)  Hematocrit 39.0 - 52.0 % 34.2(L) 31.7(L) 35.8(L)  Platelets 150 - 400 K/uL 194 136(L) 81(L)     CMP Latest Ref Rng & Units 08/06/2021 08/04/2021 08/02/2021  Glucose 70 - 99 mg/dL 106(H) 93 74  BUN 8 - 23 mg/dL 20 17 24(H)  Creatinine 0.61 - 1.24 mg/dL 1.30(H) 0.99 1.21  Sodium 135 - 145 mmol/L 134(L) 133(L) 133(L)  Potassium 3.5 - 5.1 mmol/L 4.0 4.0 3.7  Chloride 98 - 111 mmol/L 97(L) 99 100  CO2 22 - 32 mmol/L 30 26 26   Calcium 8.9 - 10.3 mg/dL 8.2(L) 8.1(L) 8.1(L)  Total Protein 6.5 - 8.1 g/dL - - 5.3(L)  Total Bilirubin 0.3 - 1.2 mg/dL - - 0.8  Alkaline Phos 38 - 126 U/L - - 75  AST 15 - 41 U/L - - 21  ALT 0 - 44 U/L - - 27    Ct forearm extensive subcutaneous edema circumferentially involving the forearm, but more severely superficial to the posterior compartment and lateral to the ulna. No subcutaneous loculated fluid collection, however, is identified. There is no subcutaneous gas identified.  Impression/recommendation Streptococcus pyogenes bacteremia  Left arm cellulitis/ soft tissue infection with superficial necrosis CT imaging no air or gas or abscess Was On  cefazolin+ clindamycin - which was changed  to PO linezolid 600mg   Q 12 because of fluid overload on 08/03/21-  improvement in arm-  He will need 10 more days of linezolid- has [facility] which will cost him $ 50 Will need debridement of the skin and S[place]ana tissue in the future   Sepsis with shock due to the above - has resolved   AKI on CKD- resolved But because of diuresis cr mildly increased  Thrombocytopenia due to infection -resolved   Transaminitis due to septic shock- resolved   CAD    Ischemic cardiomyopathy /AICD CHF - started IV lasix  Discussed the management with patient and Dr.Wieting and Dr.Sakai and Pharmacist Will follow him as OP 08/14/21

## 2021-08-07 LAB — COMPREHENSIVE METABOLIC PANEL
ALT: 33 U/L (ref 0–44)
AST: 34 U/L (ref 15–41)
Albumin: 2.5 g/dL — ABNORMAL LOW (ref 3.5–5.0)
Alkaline Phosphatase: 83 U/L (ref 38–126)
Anion gap: 6 (ref 5–15)
BUN: 18 mg/dL (ref 8–23)
CO2: 28 mmol/L (ref 22–32)
Calcium: 8.2 mg/dL — ABNORMAL LOW (ref 8.9–10.3)
Chloride: 98 mmol/L (ref 98–111)
Creatinine, Ser: 1.08 mg/dL (ref 0.61–1.24)
GFR, Estimated: >60 mL/min (ref >60)
Glucose, Bld: 102 mg/dL — ABNORMAL HIGH (ref 70–99)
Potassium: 4.1 mmol/L (ref 3.5–5.1)
Sodium: 132 mmol/L — ABNORMAL LOW (ref 135–145)
Total Bilirubin: 0.6 mg/dL (ref 0.3–1.2)
Total Protein: 6.2 g/dL — ABNORMAL LOW (ref 6.5–8.1)

## 2021-08-07 LAB — CULTURE, BLOOD (ROUTINE X 2)
Culture: NO GROWTH
Special Requests: ADEQUATE

## 2021-08-07 MED ORDER — SILVER SULFADIAZINE 1 % EX CREA: TOPICAL_CREAM | Freq: Two times a day (BID) | CUTANEOUS | 0 refills | Status: DC

## 2021-08-07 MED ORDER — SACUBITRIL-VALSARTAN 24-26 MG PO TABS: 1.0000 | ORAL_TABLET | Freq: Two times a day (BID) | ORAL | 0 refills | Status: DC

## 2021-08-07 MED ORDER — TORSEMIDE 20 MG PO TABS: 20.0000 mg | ORAL_TABLET | Freq: Every day | ORAL | 0 refills | Status: DC

## 2021-08-07 MED ORDER — OXYCODONE HCL 5 MG PO TABS: 5.0000 mg | ORAL_TABLET | Freq: Four times a day (QID) | ORAL | 0 refills | Status: DC | PRN

## 2021-08-07 MED ORDER — SACUBITRIL-VALSARTAN 24-26 MG PO TABS
1.0000 | ORAL_TABLET | Freq: Two times a day (BID) | ORAL | Status: DC
  Administered 2021-08-07: 1 via ORAL
  Filled 2021-08-07 (×3): qty 1

## 2021-08-07 MED ORDER — LINEZOLID 600 MG PO TABS: 600.0000 mg | ORAL_TABLET | Freq: Two times a day (BID) | ORAL | 0 refills | Status: DC

## 2021-08-07 MED ORDER — POLYETHYLENE GLYCOL 3350 17 G PO PACK: 17.0000 g | PACK | Freq: Every day | ORAL | 0 refills | Status: DC | PRN

## 2021-08-07 NOTE — Progress Notes (Signed)
Pharmacy - [facility] - medication procurement  Plan for linezolid at discharge.  Due to $550 deductible, medication to cost $588.  Plan to use [facility] at discharge at [facility] (his pharmacy is [facility] in [place]). Cost with [facility] to be estimated $50 +/- $5. I called this pharmacy and they have linezolid in stock.  Good Rx coupon given to patient by pharmacist prior to discharge.    Doreene Eland, PharmD, BCPS, BCIDP Work Cell: [phone] 08/07/2021 11:58 AM

## 2021-08-07 NOTE — Discharge Summary (Signed)
[facility] NAME: Ian Mills    MR#:  [id number]  DATE OF BIRTH:  1956-06-28  DATE OF ADMISSION:  07/29/2021 ADMITTING PHYSICIAN: Lucrezia Starch, MD  DATE OF DISCHARGE: 08/07/2021 10:35 AM  PRIMARY CARE PHYSICIAN: McLean-Scocuzza, Nino Glow, MD    ADMISSION DIAGNOSIS:  Left arm cellulitis [S96.283] Septic shock ([place]) [A41.9, R65.21] Severe sepsis ([place]) [A41.9, R65.20]  DISCHARGE DIAGNOSIS:  Principal Problem:   Septic shock ([place]) Active Problems:   Chronic systolic CHF (congestive heart failure) ([place])   Sepsis due to group A Streptococcus with acute renal failure and septic shock (HCC)   Left arm cellulitis   Transaminitis   Acute kidney injury superimposed on CKD ([place])   Thrombocytopenia ([place])   SECONDARY DIAGNOSIS:   Past Medical History:  Diagnosis Date   Coronary artery disease    a. 3 stents to the LAD, 1 to the PDA, and 1 to the OM3; b. cath 06/2013: chronically occluded LAD, patent stent LAD, D1 20%, pLCx 30%, mLCx 20%, pRCA 70% (FFR 0.74) s/p PCI/DES, mRCA 70% s/p PCI/DES, RPDA 50%, EF 20% w/ AK of anterolateral & apical walls, moderately elevated LVEDP   Family history of lung cancer    HFrEF (heart failure with reduced ejection fraction) ([place])    a. 05/2014 Echo: EF 25-30%; b. 01/2018 Echo ([facility]): EF 30%.   HLD (hyperlipidemia)    HTN (hypertension)    ICD  single,BSX    a. DOI 11/2011; b. S/N# [id number]   Ischemic cardiomyopathy    a. 05/2014 Echo: EF 25-30%; b. 01/2018 Echo ([facility]): EF 30%, sev glob HK w/ apical/ant AK. Mild LVH. Mod LAE, mild RAE. Triv MR/TR.   Personal history of colonic polyps    Pneumonia    08/2018   Syncope and collapse    Ventricular tachycardia 11/2011   a. s/p AICD implant     HOSPITAL COURSE:   1.  Septic shock, present on admission with Streptococcus pyogenes growing out of blood cultures and left upper extremity extensive cellulitis.  The patient also had acute kidney injury and  thrombocytopenia.  Initially required pressors to maintain blood pressure.  Antihypertensive medications were held.  Initially on Ancef and Cleocin IV but with fluid overload antibiotics were changed over to p.o. Zyvox.  A CT scan of the arm showed extensive cellulitis but no necrotizing fasciitis.  Patient was seen in consultation by infectious disease and general surgery Dr. Lysle Pearl.  Dr. Lysle Pearl will follow up the patient in 1 week for close follow-up to see if debridement or skin graft needed in the future.  The patient will be prescribed 10 more days of Zyvox.  Repeat blood cultures negative for 5 days.  The patient will have home health dressing his wound with Silvadene cream twice a day after washing with soap and water.  Then cover with Vaseline gauze and ABD pad and wrap with Kerlix and Ace bandage. 2.  Acute kidney injury on chronic kidney disease stage II.  Creatinine 2.61 on presentation went down to 0.99.  Creatinine went up to 1.3 yesterday with overdiuresis.  Creatinine today down to 1.08. 3.  Acute on chronic systolic congestive heart failure.  The patient was initially given fluids and antibiotics and developed wheezing and shortness of breath.  I ended up giving IV Lasix and then switched over to torsemide.  His creatinine elevated to 1.3 yesterday with overdiuresis.  Held torsemide yesterday.  Will restart lower dose torsemide 20  mg daily upon discharge.  We will also were able to restart the low-dose Coreg and upon discharge I will restart low-dose Entresto.  Continue to hold Aldactone at this point.  Can restart as outpatient if blood pressure stable.  Referred to the CHF clinic. 4.  Transaminitis with elevation of AST and ALT on presentation.  The liver function test have normalized. 5.  Thrombocytopenia.  Platelet count dropped down to 76 at its lowest point.  Improved up to 194. 6.  History of ventricular tachycardia on amiodarone.  Patient also has an AICD 7.  History of CAD on aspirin,  Plavix, Coreg and Lipitor. 8.  COPD.  The patient did have wheezing during the hospital course but not wheezing upon discharge.  Can go back on his stiolto inhaler.   DISCHARGE CONDITIONS:   Satisfactory  CONSULTS OBTAINED:  Treatment Team:  Tsosie Billing, MD  DRUG ALLERGIES:   Allergies  Allergen Reactions   Nitroglycerin Nausea Only and Other (See Comments)    Per patient "causes severe  headache"    DISCHARGE MEDICATIONS:   Allergies as of 08/07/2021       Reactions   Nitroglycerin Nausea Only, Other (See Comments)   Per patient "causes severe  headache"        Medication List     STOP taking these medications    Entresto 97-103 MG Generic drug: sacubitril-valsartan Replaced by: sacubitril-valsartan 24-26 MG   furosemide 40 MG tablet Commonly known as: LASIX   spironolactone 25 MG tablet Commonly known as: ALDACTONE       TAKE these medications    albuterol 108 (90 Base) MCG/ACT inhaler Commonly known as: VENTOLIN HFA Inhale 1 puff into the lungs every 6 (six) hours as needed for wheezing or shortness of breath.   ALPRAZolam 0.5 MG tablet Commonly known as: XANAX Take 1 tablet (0.5 mg total) by mouth 3 (three) times daily as needed. for anxiety   amiodarone 200 MG tablet Commonly known as: Pacerone Take 1 tablet (200 mg total) by mouth daily.   aspirin 81 MG EC tablet Take 1 tablet (81 mg total) by mouth daily.   atorvastatin 80 MG tablet Commonly known as: LIPITOR Take 1 tablet (80 mg total) by mouth daily.   carvedilol 3.125 MG tablet Commonly known as: COREG Take 1 tablet (3.125 mg total) by mouth 2 (two) times daily.   clopidogrel 75 MG tablet Commonly known as: PLAVIX Take 1 tablet (75 mg total) by mouth daily.   ezetimibe 10 MG tablet Commonly known as: ZETIA Take 1 tablet (10 mg total) by mouth daily.   ipratropium-albuterol 0.5-2.5 (3) MG/3ML Soln Commonly known as: DUONEB Take 3 mLs by nebulization every 6 (six)  hours as needed.   linezolid 600 MG tablet Commonly known as: ZYVOX Take 1 tablet (600 mg total) by mouth every 12 (twelve) hours.   oxyCODONE 5 MG immediate release tablet Commonly known as: Oxy IR/ROXICODONE Take 1 tablet (5 mg total) by mouth every 6 (six) hours as needed for moderate pain.   polyethylene glycol 17 g packet Commonly known as: MIRALAX / GLYCOLAX Take 17 g by mouth daily as needed for moderate constipation.   potassium chloride 10 MEQ tablet Commonly known as: Klor-Con 10 Take 1 tablet (10 mEq total) by mouth daily.   sacubitril-valsartan 24-26 MG Commonly known as: ENTRESTO Take 1 tablet by mouth 2 (two) times daily. Replaces: Entresto 97-103 MG   silver sulfADIAZINE 1 % cream Commonly known as: SILVADENE Apply topically  2 (two) times daily.   Stiolto Respimat 2.5-2.5 MCG/ACT Aers Generic drug: Tiotropium Bromide-Olodaterol Inhale 2 puffs into the lungs daily.   tadalafil 10 MG tablet Commonly known as: Cialis Take 1-2 tablets (10-20 mg total) by mouth daily as needed for erectile dysfunction.   torsemide 20 MG tablet Commonly known as: DEMADEX Take 1 tablet (20 mg total) by mouth daily.         DISCHARGE INSTRUCTIONS:   Follow-up PMD 5 days Follow-up Dr. Lysle Pearl 1 week Follow-up CHF clinic   If you experience worsening of your admission symptoms, develop shortness of breath, life threatening emergency, suicidal or homicidal thoughts you must seek medical attention immediately by calling 911 or calling your MD immediately  if symptoms less severe.  You Must read complete instructions/literature along with all the possible adverse reactions/side effects for all the Medicines you take and that have been prescribed to you. Take any new Medicines after you have completely understood and accept all the possible adverse reactions/side effects.   Please note  You were cared for by a hospitalist during your hospital stay. If you have any questions  about your discharge medications or the care you received while you were in the hospital after you are discharged, you can call the unit and asked to speak with the hospitalist on call if the hospitalist that took care of you is not available. Once you are discharged, your primary care physician will handle any further medical issues. Please note that NO REFILLS for any discharge medications will be authorized once you are discharged, as it is imperative that you return to your primary care physician (or establish a relationship with a primary care physician if you do not have one) for your aftercare needs so that they can reassess your need for medications and monitor your lab values.    Today   CHIEF COMPLAINT:   Chief Complaint  Patient presents with   Hypotension    HISTORY OF PRESENT ILLNESS:  Ian Mills  is a 66 y.o. male came in initially with hypotension and left arm cellulitis   VITAL SIGNS:  Blood pressure (!) 150/78, pulse 64, temperature 97.6 F (36.4 C), temperature source Oral, resp. rate 19, height 5\' 9"  (1.753 m), weight 90.1 kg, SpO2 94 %.  I/O:   Intake/Output Summary (Last 24 hours) at 08/07/2021 1714 Last data filed at 08/07/2021 0900 Gross per 24 hour  Intake 360 ml  Output 500 ml  Net -140 ml    PHYSICAL EXAMINATION:  GENERAL:  66 y.o.-year-old patient lying in the bed with no acute distress.  EYES: Pupils equal, round, reactive to light and accommodation. No scleral icterus. HEENT: Head atraumatic, normocephalic. Oropharynx and nasopharynx clear.  LUNGS: Normal breath sounds bilaterally, no wheezing, rales,rhonchi or crepitation. No use of accessory muscles of respiration.  CARDIOVASCULAR: S1, S2 normal. No murmurs, rubs, or gallops.  ABDOMEN: Soft, non-tender, non-distended.  EXTREMITIES: No pedal edema.  NEUROLOGIC: Cranial nerves II through XII are intact. Muscle strength 5/5 in all extremities. Sensation intact. Gait not checked.  PSYCHIATRIC: The  patient is alert and oriented x 3.  SKIN:     DATA REVIEW:   CBC Recent Labs  Lab 08/06/21 0646  WBC 7.3  HGB 11.4*  HCT 34.2*  PLT 194    Chemistries  Recent Labs  Lab 08/07/21 0626  NA 132*  K 4.1  CL 98  CO2 28  GLUCOSE 102*  BUN 18  CREATININE 1.08  CALCIUM 8.2*  AST  34  ALT 33  ALKPHOS 83  BILITOT 0.6     Microbiology Results  Results for orders placed or performed during the hospital encounter of 07/29/21  Resp Panel by RT-PCR (Flu A&B, Covid) Nasopharyngeal Swab     Status: None   Collection Time: 07/29/21  7:43 PM   Specimen: Nasopharyngeal Swab; Nasopharyngeal(NP) swabs in vial transport medium  Result Value Ref Range Status   SARS Coronavirus 2 by RT PCR NEGATIVE NEGATIVE Final    Comment: (NOTE) SARS-CoV-2 target nucleic acids are NOT DETECTED.  The SARS-CoV-2 RNA is generally detectable in upper respiratory specimens during the acute phase of infection. The lowest concentration of SARS-CoV-2 viral copies this assay can detect is 138 copies/mL. A negative result does not preclude SARS-Cov-2 infection and should not be used as the sole basis for treatment or other patient management decisions. A negative result may occur with  improper specimen collection/handling, submission of specimen other than nasopharyngeal swab, presence of viral mutation(s) within the areas targeted by this assay, and inadequate number of viral copies(<138 copies/mL). A negative result must be combined with clinical observations, patient history, and epidemiological information. The expected result is Negative.  Fact Sheet for Patients:  EntrepreneurPulse.com.au  Fact Sheet for Healthcare Providers:  IncredibleEmployment.be  This test is no t yet approved or cleared by the [place] FDA and  has been authorized for detection and/or diagnosis of SARS-CoV-2 by FDA under an Emergency Use Authorization (EUA). This EUA will remain   in effect (meaning this test can be used) for the duration of the COVID-19 declaration under Section 564(b)(1) of the Act, 21 U.S.C.section 360bbb-3(b)(1), unless the authorization is terminated  or revoked sooner.       Influenza A by PCR NEGATIVE NEGATIVE Final   Influenza B by PCR NEGATIVE NEGATIVE Final    Comment: (NOTE) The Xpert Xpress SARS-CoV-2/FLU/RSV plus assay is intended as an aid in the diagnosis of influenza from Nasopharyngeal swab specimens and should not be used as a sole basis for treatment. Nasal washings and aspirates are unacceptable for Xpert Xpress SARS-CoV-2/FLU/RSV testing.  Fact Sheet for Patients: EntrepreneurPulse.com.au  Fact Sheet for Healthcare Providers: IncredibleEmployment.be  This test is not yet approved or cleared by the [place] FDA and has been authorized for detection and/or diagnosis of SARS-CoV-2 by FDA under an Emergency Use Authorization (EUA). This EUA will remain in effect (meaning this test can be used) for the duration of the COVID-19 declaration under Section 564(b)(1) of the Act, 21 U.S.C. section 360bbb-3(b)(1), unless the authorization is terminated or revoked.  Performed at [facility], [address] [place], [place] [address]   Culture, blood (Routine X 2) w Reflex to ID Panel     Status: Abnormal   Collection Time: 07/29/21  8:17 PM   Specimen: BLOOD  Result Value Ref Range Status   Specimen Description   Final    BLOOD BLOOD RIGHT HAND Performed at [facility], [address] [place], [place] [address]    Special Requests   Final    BOTTLES DRAWN AEROBIC AND ANAEROBIC Blood Culture results may not be optimal due to an inadequate volume of blood received in culture bottles Performed at [facility], [address], [place], [place] [address]    Culture  Setup Time   Final    GRAM POSITIVE COCCI ANAEROBIC BOTTLE ONLY CRITICAL VALUE NOTED.   VALUE IS CONSISTENT WITH PREVIOUSLY REPORTED AND CALLED VALUE. Performed at [facility], [phone] [place], [place] [address]    Culture (A)  Final    GROUP A STREP (S.PYOGENES) ISOLATED SUSCEPTIBILITIES PERFORMED ON PREVIOUS CULTURE WITHIN THE LAST 5 DAYS. Performed at [facility], [place] [address] [place], [place] [address]    Report Status 08/01/2021 FINAL  Final  Culture, blood (Routine X 2) w Reflex to ID Panel     Status: Abnormal   Collection Time: 07/29/21  8:17 PM   Specimen: BLOOD  Result Value Ref Range Status   Specimen Description   Final    BLOOD LEFT ANTECUBITAL Performed at [facility] [address] [place], [place] [address]    Special Requests   Final    BOTTLES DRAWN AEROBIC AND ANAEROBIC Blood Culture results may not be optimal due to an inadequate volume of blood received in culture bottles Performed at [facility], [address], [place], [place] [address]    Culture  Setup Time   Final    GRAM POSITIVE COCCI IN BOTH AEROBIC AND ANAEROBIC BOTTLES Organism ID to follow CRITICAL RESULT CALLED TO, READ BACK BY AND VERIFIED WITHArdeen Garland [facility] 8299 07/30/21 HNM Performed at [facility], [address], [place], [place] [address]    Culture (A)  Final    GROUP A STREP (S.PYOGENES) ISOLATED HEALTH DEPARTMENT NOTIFIED Performed at [facility], [place] [address] [place],  [address]    Report Status 08/01/2021 FINAL  Final   Organism ID, Bacteria GROUP A STREP (S.PYOGENES) ISOLATED  Final      Susceptibility   Group a strep (s.pyogenes) isolated - MIC*    PENICILLIN <=0.06 SENSITIVE Sensitive     CEFTRIAXONE <=0.12 SENSITIVE Sensitive     ERYTHROMYCIN <=0.12 SENSITIVE Sensitive     LEVOFLOXACIN 0.5 SENSITIVE Sensitive     VANCOMYCIN 0.5 SENSITIVE Sensitive     * GROUP A STREP (S.PYOGENES) ISOLATED  Blood Culture ID Panel (Reflexed)     Status: Abnormal   Collection Time: 07/29/21  8:17  PM  Result Value Ref Range Status   Enterococcus faecalis NOT DETECTED NOT DETECTED Final   Enterococcus Faecium NOT DETECTED NOT DETECTED Final   Listeria monocytogenes NOT DETECTED NOT DETECTED Final   Staphylococcus species NOT DETECTED NOT DETECTED Final   Staphylococcus aureus (BCID) NOT DETECTED NOT DETECTED Final   Staphylococcus epidermidis NOT DETECTED NOT DETECTED Final   Staphylococcus lugdunensis NOT DETECTED NOT DETECTED Final   Streptococcus species DETECTED (A) NOT DETECTED Final    Comment: CRITICAL RESULT CALLED TO, READ BACK BY AND VERIFIED WITH: [name] HICKS PHARMD [id number] 07/30/21 HNM    Streptococcus agalactiae NOT DETECTED NOT DETECTED Final   Streptococcus pneumoniae NOT DETECTED NOT DETECTED Final   Streptococcus pyogenes DETECTED (A) NOT DETECTED Final    Comment: CRITICAL RESULT CALLED TO, READ BACK BY AND VERIFIED WITH: Ardeen Garland PHARMD [id number] 07/30/21 HNM    A.calcoaceticus-baumannii NOT DETECTED NOT DETECTED Final   Bacteroides fragilis NOT DETECTED NOT DETECTED Final   Enterobacterales NOT DETECTED NOT DETECTED Final   Enterobacter cloacae complex NOT DETECTED NOT DETECTED Final   Escherichia coli NOT DETECTED NOT DETECTED Final   Klebsiella aerogenes NOT DETECTED NOT DETECTED Final   Klebsiella oxytoca NOT DETECTED NOT DETECTED Final   Klebsiella pneumoniae NOT DETECTED NOT DETECTED Final   Proteus species NOT DETECTED NOT DETECTED Final   Salmonella species NOT DETECTED NOT DETECTED Final   Serratia marcescens NOT DETECTED NOT DETECTED Final   Haemophilus influenzae  NOT DETECTED NOT DETECTED Final   Neisseria meningitidis NOT DETECTED NOT DETECTED Final   Pseudomonas aeruginosa NOT DETECTED NOT DETECTED Final   Stenotrophomonas maltophilia NOT DETECTED NOT DETECTED Final   Candida albicans NOT DETECTED NOT DETECTED Final   Candida auris NOT DETECTED NOT DETECTED Final   Candida glabrata NOT DETECTED NOT DETECTED Final   Candida krusei NOT DETECTED NOT  DETECTED Final   Candida parapsilosis NOT DETECTED NOT DETECTED Final   Candida tropicalis NOT DETECTED NOT DETECTED Final   Cryptococcus neoformans/gattii NOT DETECTED NOT DETECTED Final    Comment: Performed at [facility], [place], [place] [address]  Aerobic Culture w Gram Stain (superficial specimen)     Status: None   Collection Time: 07/30/21  2:05 AM   Specimen: Arm; Wound  Result Value Ref Range Status   Specimen Description   Final    ARM LEFT Performed at [facility], [address] [place], [place] [address]    Special Requests   Final    NONE Performed at [facility], [address] [place], [place] [address]    Gram Stain   Final    FEW NO SQUAMOUS EPITHELIAL CELLS SEEN FEW WBC SEEN FEW GRAM POSITIVE COCCI    Culture   Final    ABUNDANT STREPTOCOCCUS PYOGENES Beta hemolytic streptococci are predictably susceptible to penicillin and other beta lactams. Susceptibility testing not routinely performed. Performed at [facility], [place] [address] [place], [place] [address]    Report Status 07/31/2021 FINAL  Final  CULTURE, BLOOD (ROUTINE X 2) w Reflex to ID Panel     Status: None   Collection Time: 08/02/21  4:33 PM   Specimen: BLOOD  Result Value Ref Range Status   Specimen Description BLOOD BLOOD RIGHT HAND  Final   Special Requests   Final    BOTTLES DRAWN AEROBIC AND ANAEROBIC Blood Culture adequate volume   Culture   Final    NO GROWTH 5 DAYS Performed at [facility], [address] [place], [place] [address]    Report Status 08/07/2021 FINAL  Final  Culture, blood (Routine X 2) w Reflex to ID Panel     Status: None (Preliminary result)   Collection Time: 08/03/21  5:01 AM   Specimen: BLOOD RIGHT FOREARM  Result Value Ref Range Status   Specimen Description BLOOD RIGHT FOREARM  Final   Special Requests   Final    BOTTLES DRAWN AEROBIC AND ANAEROBIC Blood Culture adequate volume   Culture    Final    NO GROWTH 4 DAYS Performed at [facility], [address], [place], [place] [address]    Report Status PENDING  Incomplete      Management plans discussed with the patient, family and they are in agreement.  CODE STATUS:  Code Status History     Date Active Date Inactive Code Status Order ID Comments User Context   07/29/2021 2219 08/07/2021 1541 Full Code [id number]  Rust-Chester, Huel Cote, NP ED   09/09/2018 1813 09/14/2018 1619 Full Code [id number]  Ivan Anchors, RN Inpatient   09/08/2018 1906 09/09/2018 1813 Partial Code [id number]  Demetrios Loll, MD ED       TOTAL TIME TAKING CARE OF THIS PATIENT: 32 minutes.    Loletha Grayer M.D on 08/07/2021 at 5:14 PM   [facility]  CC: Primary care physician; McLean-Scocuzza, Nino Glow, MD

## 2021-08-07 NOTE — Care Management Important Message (Signed)
Important Message  Patient Details  Name: Ian Mills MRN: [id number] Date of Birth: 1956/07/01   Medicare Important Message Given:  Yes     Juliann Pulse A Ariel Wingrove 08/07/2021, 10:02 AM

## 2021-08-07 NOTE — TOC Progression Note (Signed)
Transition of Care [facility]) - Progression Note    Patient Details  Name: Ian Mills MRN: [id number] Date of Birth: April 30, 1956  Transition of Care [facility]) CM/SW [place], RN Phone Number: 08/07/2021, 9:15 AM  Clinical Narrative:   Patient is scheduled to be discharged today, will need RN for [place].  Contacted Corene Cornea from [facility], able to accept patient for nursing, wound care.  Patient informed and accepts [facility] as his home health provider.           Expected Discharge Plan and Services           Expected Discharge Date: 08/07/21                                     Social Determinants of Health (SDOH) Interventions    Readmission Risk Interventions Readmission Risk Prevention Plan 08/01/2021  Transportation Screening Complete  PCP or Specialist Appt within 5-7 Days Complete  Home Care Screening Complete  Medication Review (RN CM) Complete  Some recent data might be hidden

## 2021-08-08 ENCOUNTER — Telehealth: Payer: Self-pay

## 2021-08-08 LAB — CULTURE, BLOOD (ROUTINE X 2)
Culture: NO GROWTH
Special Requests: ADEQUATE

## 2021-08-08 NOTE — Telephone Encounter (Signed)
[employer] sent fax, Pt is APPROVED for Entresto until 07/28/2022 at no cost to patient

## 2021-08-09 ENCOUNTER — Telehealth: Payer: Self-pay | Admitting: Cardiovascular Disease

## 2021-08-09 ENCOUNTER — Telehealth: Payer: Self-pay

## 2021-08-09 ENCOUNTER — Telehealth: Payer: Self-pay | Admitting: Pharmacy Technician

## 2021-08-09 DIAGNOSIS — I5043 Acute on chronic combined systolic (congestive) and diastolic (congestive) heart failure: Secondary | ICD-10-CM | POA: Diagnosis not present

## 2021-08-09 DIAGNOSIS — Z79891 Long term (current) use of opiate analgesic: Secondary | ICD-10-CM | POA: Diagnosis not present

## 2021-08-09 DIAGNOSIS — I130 Hypertensive heart and chronic kidney disease with heart failure and stage 1 through stage 4 chronic kidney disease, or unspecified chronic kidney disease: Secondary | ICD-10-CM | POA: Diagnosis not present

## 2021-08-09 DIAGNOSIS — Z7982 Long term (current) use of aspirin: Secondary | ICD-10-CM | POA: Diagnosis not present

## 2021-08-09 DIAGNOSIS — F419 Anxiety disorder, unspecified: Secondary | ICD-10-CM | POA: Diagnosis not present

## 2021-08-09 DIAGNOSIS — D696 Thrombocytopenia, unspecified: Secondary | ICD-10-CM | POA: Diagnosis not present

## 2021-08-09 DIAGNOSIS — I255 Ischemic cardiomyopathy: Secondary | ICD-10-CM | POA: Diagnosis not present

## 2021-08-09 DIAGNOSIS — N1831 Chronic kidney disease, stage 3a: Secondary | ICD-10-CM | POA: Diagnosis not present

## 2021-08-09 DIAGNOSIS — E785 Hyperlipidemia, unspecified: Secondary | ICD-10-CM | POA: Diagnosis not present

## 2021-08-09 DIAGNOSIS — J449 Chronic obstructive pulmonary disease, unspecified: Secondary | ICD-10-CM | POA: Diagnosis not present

## 2021-08-09 DIAGNOSIS — I2510 Atherosclerotic heart disease of native coronary artery without angina pectoris: Secondary | ICD-10-CM | POA: Diagnosis not present

## 2021-08-09 DIAGNOSIS — L03114 Cellulitis of left upper limb: Secondary | ICD-10-CM | POA: Diagnosis not present

## 2021-08-09 DIAGNOSIS — Z9181 History of falling: Secondary | ICD-10-CM | POA: Diagnosis not present

## 2021-08-09 DIAGNOSIS — Z7902 Long term (current) use of antithrombotics/antiplatelets: Secondary | ICD-10-CM | POA: Diagnosis not present

## 2021-08-09 DIAGNOSIS — Z596 Low income: Secondary | ICD-10-CM

## 2021-08-09 DIAGNOSIS — Z4800 Encounter for change or removal of nonsurgical wound dressing: Secondary | ICD-10-CM | POA: Diagnosis not present

## 2021-08-09 DIAGNOSIS — Z87891 Personal history of nicotine dependence: Secondary | ICD-10-CM | POA: Diagnosis not present

## 2021-08-09 DIAGNOSIS — B950 Streptococcus, group A, as the cause of diseases classified elsewhere: Secondary | ICD-10-CM | POA: Diagnosis not present

## 2021-08-09 NOTE — Telephone Encounter (Signed)
Patient reports while in the hospital they changed his cardiac medications. Patient states that he was admitted for cellulitis of his left arm. During his stay he developed sepsis and SBP was in the 70's. He was then sent to ICU for medication to increase his blood pressure and during this stay they changed his medications. He was concerned about these changes and wanted to let us know. He did not see cardiology during his stay. Medications stopped were furosemide, spironolactone, and dose changed on Entresto from 97-103 mg to 24-26 mg.   Instructed him to continue current medications. Monitor blood pressures 2 hours after and to keep a log to bring to appointment. Scheduled him to come in next week with APP.

## 2021-08-09 NOTE — Telephone Encounter (Signed)
Please call to discuss medications. Patient states since he was in the hospital, he is not sure what he should be taking.

## 2021-08-09 NOTE — Progress Notes (Signed)
[facility])                                            [place] Team    08/09/2021  Ian Mills [birth year] [id number]  Successful outreach call placed to [place] in regard to patient's application for Stiolto.  Spoke to [place] who informs patient is APPROVED 07/29/21-07/28/22. Patient will need to phone BI when he needs a refill approximately 2 weeks before running out of medication just as he did in 2022. The medication will be delivered to the patient's home when requested.  Myrtha Tonkovich P. Hazem Kenner, [place]  [phone]

## 2021-08-09 NOTE — Telephone Encounter (Signed)
Transition Care Management Unsuccessful Follow-up Telephone Call  Date of discharge and from where:  08/07/21 [facility]  Attempts:  1st Attempt  Reason for unsuccessful TCM follow-up call:  Unable to leave message. No answer. Will  follow.

## 2021-08-10 ENCOUNTER — Telehealth: Payer: Self-pay | Admitting: Pharmacy Technician

## 2021-08-10 DIAGNOSIS — Z596 Low income: Secondary | ICD-10-CM

## 2021-08-10 NOTE — Progress Notes (Signed)
[place] [facility])                                            [place] Team    08/10/2021  ARK AGRUSA August 16, 1955 [id number]  Care coordination call placed to [employer] in regard to [facility] application.  Spoke to Shirlean Mylar who informs patient is re enrolled and APPROVED 07/29/21-07/28/22. She informs patient will need to call in for his refills as he did in 2022 and the medication will be delivered to his home.  Keene Gilkey P. Zyheir Daft, [place]  [phone]

## 2021-08-10 NOTE — Telephone Encounter (Signed)
Transition Care Management Unsuccessful Follow-up Telephone Call  Date of discharge and from where:  08/07/21 [facility]  Attempts:  2nd Attempt  Reason for unsuccessful TCM follow-up call:  Left voice message. HFU scheduled 08/17/21 @ 11:30. Will follow as appropriate.

## 2021-08-13 DIAGNOSIS — I5043 Acute on chronic combined systolic (congestive) and diastolic (congestive) heart failure: Secondary | ICD-10-CM | POA: Diagnosis not present

## 2021-08-13 DIAGNOSIS — I130 Hypertensive heart and chronic kidney disease with heart failure and stage 1 through stage 4 chronic kidney disease, or unspecified chronic kidney disease: Secondary | ICD-10-CM | POA: Diagnosis not present

## 2021-08-13 DIAGNOSIS — I2510 Atherosclerotic heart disease of native coronary artery without angina pectoris: Secondary | ICD-10-CM | POA: Diagnosis not present

## 2021-08-13 DIAGNOSIS — L03114 Cellulitis of left upper limb: Secondary | ICD-10-CM | POA: Diagnosis not present

## 2021-08-13 DIAGNOSIS — B950 Streptococcus, group A, as the cause of diseases classified elsewhere: Secondary | ICD-10-CM | POA: Diagnosis not present

## 2021-08-13 DIAGNOSIS — N1831 Chronic kidney disease, stage 3a: Secondary | ICD-10-CM | POA: Diagnosis not present

## 2021-08-14 ENCOUNTER — Telehealth: Payer: Self-pay | Admitting: Cardiovascular Disease

## 2021-08-14 ENCOUNTER — Other Ambulatory Visit: Payer: Self-pay

## 2021-08-14 ENCOUNTER — Ambulatory Visit: Payer: Self-pay | Admitting: Surgery

## 2021-08-14 ENCOUNTER — Ambulatory Visit: Payer: Medicare Other | Attending: Infectious Diseases | Admitting: Infectious Diseases

## 2021-08-14 VITALS — BP 125/75 | HR 78 | Resp 16 | Ht 69.0 in | Wt 198.0 lb

## 2021-08-14 DIAGNOSIS — E119 Type 2 diabetes mellitus without complications: Secondary | ICD-10-CM | POA: Insufficient documentation

## 2021-08-14 DIAGNOSIS — I110 Hypertensive heart disease with heart failure: Secondary | ICD-10-CM | POA: Diagnosis not present

## 2021-08-14 DIAGNOSIS — I5020 Unspecified systolic (congestive) heart failure: Secondary | ICD-10-CM | POA: Diagnosis not present

## 2021-08-14 DIAGNOSIS — L03114 Cellulitis of left upper limb: Secondary | ICD-10-CM | POA: Diagnosis not present

## 2021-08-14 DIAGNOSIS — A491 Streptococcal infection, unspecified site: Secondary | ICD-10-CM

## 2021-08-14 DIAGNOSIS — I2510 Atherosclerotic heart disease of native coronary artery without angina pectoris: Secondary | ICD-10-CM | POA: Diagnosis not present

## 2021-08-14 DIAGNOSIS — B950 Streptococcus, group A, as the cause of diseases classified elsewhere: Secondary | ICD-10-CM | POA: Diagnosis not present

## 2021-08-14 DIAGNOSIS — Z95810 Presence of automatic (implantable) cardiac defibrillator: Secondary | ICD-10-CM | POA: Insufficient documentation

## 2021-08-14 DIAGNOSIS — Z955 Presence of coronary angioplasty implant and graft: Secondary | ICD-10-CM | POA: Diagnosis not present

## 2021-08-14 DIAGNOSIS — I255 Ischemic cardiomyopathy: Secondary | ICD-10-CM | POA: Insufficient documentation

## 2021-08-14 DIAGNOSIS — I472 Ventricular tachycardia, unspecified: Secondary | ICD-10-CM | POA: Insufficient documentation

## 2021-08-14 DIAGNOSIS — Z8249 Family history of ischemic heart disease and other diseases of the circulatory system: Secondary | ICD-10-CM | POA: Insufficient documentation

## 2021-08-14 DIAGNOSIS — Z792 Long term (current) use of antibiotics: Secondary | ICD-10-CM | POA: Diagnosis not present

## 2021-08-14 DIAGNOSIS — Z7982 Long term (current) use of aspirin: Secondary | ICD-10-CM | POA: Insufficient documentation

## 2021-08-14 DIAGNOSIS — Z7902 Long term (current) use of antithrombotics/antiplatelets: Secondary | ICD-10-CM | POA: Diagnosis not present

## 2021-08-14 DIAGNOSIS — Z79899 Other long term (current) drug therapy: Secondary | ICD-10-CM | POA: Diagnosis not present

## 2021-08-14 DIAGNOSIS — S41102D Unspecified open wound of left upper arm, subsequent encounter: Secondary | ICD-10-CM | POA: Diagnosis not present

## 2021-08-14 MED ORDER — AMOXICILLIN 500 MG PO CAPS: 500.0000 mg | ORAL_CAPSULE | Freq: Three times a day (TID) | ORAL | 0 refills | Status: DC

## 2021-08-14 NOTE — H&P (Signed)
Subjective:  CC: Arm wound, left, sequela [S41.102S] POSTOP  HPI:  Ian Mills is a 66 y.o. male who is here for followup from above.  No issues, dressing changes still uncomfortable.     Current Medications: has a current medication list which includes the following prescription(s): albuterol, alprazolam, amiodarone, aspirin, atorvastatin, carvedilol, clopidogrel, zetia, ipratropium-albuterol, linezolid, nitroglycerin, oxycodone, potassium chloride, sacubitril-valsartan, sulfamethoxazole-trimethoprim, tadalafil, torsemide, furosemide, and spironolactone.  Allergies:  Allergies Allergen Reactions  Nitroglycerin Other (See Comments)   Other Reaction: N/V, SEVERE HEADACHES   ROS: General: Denies weight loss, weight gain, fatigue, fevers, chills, and night sweats. Heart: Denies chest pain, palpitations, racing heart, irregular heartbeat, leg pain or swelling, and decreased activity tolerance. Respiratory: Denies breathing difficulty, shortness of breath, wheezing, cough, and sputum. GI: Denies change in appetite, heartburn, nausea, vomiting, constipation, diarrhea, and blood in stool. GU: Denies difficulty urinating, pain with urinating, urgency, frequency, blood in urine    Objective:    BP (!) 145/77    Pulse 61    Ht 172.7 cm (5' 8" )    Wt 89.4 kg (197 lb)    BMI 29.95 kg/m   Constitutional :  Alert, no distress, cooperative Gastrointestinal: soft, non-tender; bowel sounds normal; no masses,  no organomegaly.  Musculoskeletal: Steady gait and movement Skin: Cool and moist,wound with no dark skin discoloration as before, areas of exudative discharge, but no purulent or fluctuance.  Arm swelling has resolved.  Wound measures 10cm x4.5cm   Psychiatric: Normal affect, non-agitated, not confused         LABS:  N/A   RADS: N/A  Assessment:     Arm wound, left, sequela [S41.102S], secondary to left arm cellulitis.  Plan:    1. Healing well.  Discussed  application of myriad matrix to facilitate healing, since the wound and cellulitis seems to have resolved at this point.  R/b/a discussed and pt agreeable to proceed.  Will schedule and recommending holding plavix for the small debridement needed for exudative islands. Continue current wound care in the meantime.

## 2021-08-14 NOTE — Patient Instructions (Addendum)
You are here for follow up for the left forearm infection with streptococcus pyogenes, you are currently on linezolid and will finish on 08/17/21- You will take amoxicillin 500mg  every 8 hours for 7 days following that.follow up with surgeon- you need CBC with diff and CMP labs- as per your request , you will get it done on Friday when you see yor Pcp/Cardiology

## 2021-08-14 NOTE — H&P (View-Only) (Signed)
Subjective:  CC: Arm wound, left, sequela [S41.102S] POSTOP  HPI:  Ian Mills is a 66 y.o. male who is here for followup from above.  No issues, dressing changes still uncomfortable.     Current Medications: has a current medication list which includes the following prescription(s): albuterol, alprazolam, amiodarone, aspirin, atorvastatin, carvedilol, clopidogrel, zetia, ipratropium-albuterol, linezolid, nitroglycerin, oxycodone, potassium chloride, sacubitril-valsartan, sulfamethoxazole-trimethoprim, tadalafil, torsemide, furosemide, and spironolactone.  Allergies:  Allergies Allergen Reactions  Nitroglycerin Other (See Comments)   Other Reaction: N/V, SEVERE HEADACHES   ROS: General: Denies weight loss, weight gain, fatigue, fevers, chills, and night sweats. Heart: Denies chest pain, palpitations, racing heart, irregular heartbeat, leg pain or swelling, and decreased activity tolerance. Respiratory: Denies breathing difficulty, shortness of breath, wheezing, cough, and sputum. GI: Denies change in appetite, heartburn, nausea, vomiting, constipation, diarrhea, and blood in stool. GU: Denies difficulty urinating, pain with urinating, urgency, frequency, blood in urine    Objective:    BP (!) 145/77    Pulse 61    Ht 172.7 cm (5' 8" )    Wt 89.4 kg (197 lb)    BMI 29.95 kg/m   Constitutional :  Alert, no distress, cooperative Gastrointestinal: soft, non-tender; bowel sounds normal; no masses,  no organomegaly.  Musculoskeletal: Steady gait and movement Skin: Cool and moist,wound with no dark skin discoloration as before, areas of exudative discharge, but no purulent or fluctuance.  Arm swelling has resolved.  Wound measures 10cm x4.5cm   Psychiatric: Normal affect, non-agitated, not confused         LABS:  N/A   RADS: N/A  Assessment:     Arm wound, left, sequela [S41.102S], secondary to left arm cellulitis.  Plan:    1. Healing well.  Discussed  application of myriad matrix to facilitate healing, since the wound and cellulitis seems to have resolved at this point.  R/b/a discussed and pt agreeable to proceed.  Will schedule and recommending holding plavix for the small debridement needed for exudative islands. Continue current wound care in the meantime.

## 2021-08-14 NOTE — Progress Notes (Signed)
NAME: Ian Mills  DOB: 12-04-1955  MRN: [id number]  Date/Time: 08/14/2021 1:05 PM   Subjective:   ? Ian Mills is a 66 y.o. male with a history of of CAD, S/p Stent, AICD for ventricular tachycardia, ischemic cardiomyopathy, HTN,  Is here for follow up with his wife Recently in hospital between 07/29/21-08/07/21 for strep pyogenes septicemia, necrotizing cellulitis of left forearm. Was seen by surgeon and no intervention was deemed necessary then. He received Iv antibiotics and after initial stay in ICU he progressed in the right direction in the regular floor He was discharged on 1/10 on linezolid for 10 days HE is doing better He saw Surgeon yesterday and there is a plan for myriad matrix to facilitate healing after debridement of exudative islands- scheduled for 07/2721    Past Medical History:  Diagnosis Date   Coronary artery disease    a. 3 stents to the LAD, 1 to the PDA, and 1 to the OM3; b. cath 06/2013: chronically occluded LAD, patent stent LAD, D1 20%, pLCx 30%, mLCx 20%, pRCA 70% (FFR 0.74) s/p PCI/DES, mRCA 70% s/p PCI/DES, RPDA 50%, EF 20% w/ AK of anterolateral & apical walls, moderately elevated LVEDP   Family history of lung cancer    HFrEF (heart failure with reduced ejection fraction) ([place])    a. 05/2014 Echo: EF 25-30%; b. 01/2018 Echo ([facility]): EF 30%.   HLD (hyperlipidemia)    HTN (hypertension)    ICD  single,BSX    a. DOI 11/2011; b. S/N# [id number]   Ischemic cardiomyopathy    a. 05/2014 Echo: EF 25-30%; b. 01/2018 Echo ([facility]): EF 30%, sev glob HK w/ apical/ant AK. Mild LVH. Mod LAE, mild RAE. Triv MR/TR.   Personal history of colonic polyps    Pneumonia    08/2018   Syncope and collapse    Ventricular tachycardia 11/2011   a. s/p AICD implant     Past Surgical History:  Procedure Laterality Date   appendectomy     CARDIAC CATHETERIZATION  Nov 30 2011   [facility]   CARDIAC DEFIBRILLATOR PLACEMENT  May 2013   COLONOSCOPY WITH PROPOFOL N/A 12/22/2020   Procedure:  COLONOSCOPY WITH PROPOFOL;  Surgeon: Jonathon Bellows, MD;  Location: [facility] ENDOSCOPY;  Service: Gastroenterology;  Laterality: N/A;   CORONARY ANGIOPLASTY  2014   x 2 stents   CORONARY STENT PLACEMENT     x7   LEFT HEART CATH AND CORONARY ANGIOGRAPHY Left 08/30/2019   Procedure: LEFT HEART CATH AND CORONARY ANGIOGRAPHY;  Surgeon: Wellington Hampshire, MD;  Location: [facility];  Service: Cardiovascular;  Laterality: Left;   PROSTATE SURGERY     urethra grew around prostate    TUMOR REMOVAL  2001   chest thymic cyst; behind lungs [facility] benign     Social History   Socioeconomic History   Marital status: Married    Spouse name: Hilda Blades    Number of children: 2   Years of education: Not on file   Highest education level: Not on file  Occupational History   Occupation: retired   Tobacco Use   Smoking status: Former    Packs/day: 0.50    Years: 42.00    Pack years: 21.00    Types: Cigarettes    Quit date: 11/10/2019    Years since quitting: 1.7   Smokeless tobacco: Never  Vaping Use   Vaping Use: Never used  Substance and Sexual Activity   Alcohol use: Yes    Alcohol/week: 8.0 standard  drinks    Types: 8 Cans of beer per week    Comment: per week   Drug use: No   Sexual activity: Not on file  Other Topics Concern   Not on file  Social History Narrative   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    Lives in [place]    2 kids daughters    Gets regular exercise   Smoker   [employer]    Social Determinants of Health   Financial Resource Strain: Medium Risk   Difficulty of Paying Living Expenses: Somewhat hard  Food Insecurity: No Food Insecurity   Worried About Charity fundraiser in the Last Year: Never true   Arboriculturist in the Last Year: Never true  Transportation Needs: No Transportation Needs   Lack of Transportation (Medical): No   Lack of Transportation (Non-Medical): No  Physical Activity:  Insufficiently Active   Days of Exercise per Week: 4 days   Minutes of Exercise per Session: 20 min  Stress: No Stress Concern Present   Feeling of Stress : Not at all  Social Connections: Unknown   Frequency of Communication with Friends and Family: More than three times a week   Frequency of Social Gatherings with Friends and Family: Not on file   Attends Religious Services: Not on Electrical engineer or Organizations: Not on file   Attends Archivist Meetings: Not on file   Marital Status: Not on file  Intimate Partner Violence: Not At Risk   Fear of Current or Ex-Partner: No   Emotionally Abused: No   Physically Abused: No   Sexually Abused: No    Family History  Problem Relation Age of Onset   Cancer - Lung Mother    Hypertension Mother    Heart attack Mother    Cancer Father        larynx   Dementia Maternal Grandmother    Heart attack Paternal Grandfather    Colon cancer Neg Hx    Stomach cancer Neg Hx    Pancreatic cancer Neg Hx    Allergies  Allergen Reactions   Nitroglycerin Nausea Only and Other (See Comments)    Per patient "causes severe  headache"   I? Current Outpatient Medications  Medication Sig Dispense Refill   albuterol (VENTOLIN HFA) 108 (90 Base) MCG/ACT inhaler Inhale 1 puff into the lungs every 6 (six) hours as needed for wheezing or shortness of breath.     ALPRAZolam (XANAX) 0.5 MG tablet Take 1 tablet (0.5 mg total) by mouth 3 (three) times daily as needed. for anxiety 90 tablet 5   amiodarone (PACERONE) 200 MG tablet Take 1 tablet (200 mg total) by mouth daily. 90 tablet 0   aspirin 81 MG EC tablet Take 1 tablet (81 mg total) by mouth daily. 30 tablet 6   atorvastatin (LIPITOR) 80 MG tablet Take 1 tablet (80 mg total) by mouth daily. 90 tablet 3   carvedilol (COREG) 3.125 MG tablet Take 1 tablet (3.125 mg total) by mouth 2 (two) times daily. 180 tablet 3   clopidogrel (PLAVIX) 75 MG tablet Take 1 tablet (75 mg total) by  mouth daily. 90 tablet 3   ezetimibe (ZETIA) 10 MG tablet Take 1 tablet (10 mg total) by mouth daily. 90 tablet 3   ipratropium-albuterol (DUONEB) 0.5-2.5 (3) MG/3ML SOLN Take 3 mLs by nebulization every  6 (six) hours as needed. 360 mL 11   linezolid (ZYVOX) 600 MG tablet Take 1 tablet (600 mg total) by mouth every 12 (twelve) hours. 20 tablet 0   oxyCODONE (OXY IR/ROXICODONE) 5 MG immediate release tablet Take 1 tablet (5 mg total) by mouth every 6 (six) hours as needed for moderate pain. 12 tablet 0   polyethylene glycol (MIRALAX / GLYCOLAX) 17 g packet Take 17 g by mouth daily as needed for moderate constipation. 14 each 0   potassium chloride (KLOR-CON 10) 10 MEQ tablet Take 1 tablet (10 mEq total) by mouth daily. 90 tablet 3   sacubitril-valsartan (ENTRESTO) 24-26 MG Take 1 tablet by mouth 2 (two) times daily. 60 tablet 0   silver sulfADIAZINE (SILVADENE) 1 % cream Apply topically 2 (two) times daily. 50 g 0   tadalafil (CIALIS) 10 MG tablet Take 1-2 tablets (10-20 mg total) by mouth daily as needed for erectile dysfunction. 30 tablet 11   Tiotropium Bromide-Olodaterol (STIOLTO RESPIMAT) 2.5-2.5 MCG/ACT AERS Inhale 2 puffs into the lungs daily. 4 g 0   torsemide (DEMADEX) 20 MG tablet Take 1 tablet (20 mg total) by mouth daily. 30 tablet 0   No current facility-administered medications for this visit.     Abtx:  Anti-infectives (From admission, onward)    None       REVIEW OF SYSTEMS:  Const: negative fever, negative chills, negative weight loss Eyes: negative diplopia or visual changes, negative eye pain ENT: negative coryza, negative sore throat Resp: negative cough, hemoptysis, dyspnea Cards: negative for chest pain, palpitations, lower extremity edema GU: negative for frequency, dysuria and hematuria GI: Negative for abdominal pain, diarrhea, bleeding, constipation Skin: negative for rash and pruritus Heme: negative for easy bruising and gum/nose bleeding MS: still has some  pain left arm Neurolo:negative for headaches, dizziness, vertigo, memory problems  Psych: negative for feelings of anxiety, depression  Endocrine:  diabetes Allergy/Immunology-as above Objective:  VITALS:  BP 125/75    Pulse 78    Resp 16    Ht 5' 9" (1.753 m)    Wt 198 lb (89.8 kg)    SpO2 98%    BMI 29.24 kg/m  PHYSICAL EXAM:  General: Alert, cooperative, no distress, appears stated age.  Head: Normocephalic, without obvious abnormality, atraumatic. Eyes: Conjunctivae clear, anicteric sclerae. Pupils are equal ENT Nares normal. No drainage or sinus tenderness. Lips, mucosa, and tongue normal. No Thrush Neck: Supple, symmetrical, no adenopathy, thyroid: non tender no carotid bruit and no JVD. Back: No CVA tenderness. Lungs: Clear to auscultation bilaterally. No Wheezing or Rhonchi. No rales. Heart: Regular rate and rhythm, no murmur, rub or gallop. Abdomen: Soft, non-tender,not distended. Bowel sounds normal. No masses Extremities:   1/17  1/10   08/05/21   08/03/21   atraumatic, no cyanosis. No edema. No clubbing Skin: No rashes or lesions. Or bruising Lymph: Cervical, supraclavicular normal. Neurologic: Grossly non-focal Pertinent Labs Lab Results CBC    Component Value Date/Time   WBC 7.3 08/06/2021 0646   RBC 3.88 (L) 08/06/2021 0646   HGB 11.4 (L) 08/06/2021 0646   HGB CANCELED 08/18/2019 1139   HCT 34.2 (L) 08/06/2021 0646   HCT CANCELED 08/18/2019 1139   PLT 194 08/06/2021 0646   PLT CANCELED 08/18/2019 1139   MCV 88.1 08/06/2021 0646   MCV 94 05/30/2014 0455   MCH 29.4 08/06/2021 0646   MCHC 33.3 08/06/2021 0646   RDW 14.0 08/06/2021 0646   RDW 14.4 05/30/2014 0455   LYMPHSABS 0.5 (  L) 07/29/2021 1830   LYMPHSABS 1.9 05/30/2014 0455   MONOABS 0.4 07/29/2021 1830   MONOABS 0.8 05/30/2014 0455   EOSABS 0.0 07/29/2021 1830   EOSABS 0.1 05/30/2014 0455   BASOSABS 0.1 07/29/2021 1830   BASOSABS 0.1 05/30/2014 0455    CMP Latest Ref Rng & Units  08/07/2021 08/06/2021 08/04/2021  Glucose 70 - 99 mg/dL 102(H) 106(H) 93  BUN 8 - 23 mg/dL _0 Creatinine 0.61 - 1.24 mg/dL 1.08 1.30(H) 0.99  Sodium 135 - 145 mmol/L 132(L) 134(L) 133(L)  Potassium 3.5 - 5.1 mmol/L 4.1 4.0 4.0  Chloride 98 - 111 mmol/L 98 97(L) 99  CO2 22 - 32 mmol/L _1 Calcium 8.9 - 10.3 mg/dL 8.2(L) 8.2(L) 8.1(L)  Total Protein 6.5 - 8.1 g/dL 6.2(L) - -  Total Bilirubin 0.3 - 1.2 mg/dL 0.6 - -  Alkaline Phos 38 - 126 U/L 83 - -  AST 15 - 41 U/L 34 - -  ALT 0 - 44 U/L 33 - -      Microbiology: No results found for this or any previous visit (from the past 240 hour(s)).  IMAGING RESULTS: I have personally reviewed the films ? Impression/Recommendation ?Streptococcus pyogenes bacteremia  Left arm necrotizing cellulitis  On linezolid - will complete on 08/17/21- will do amoxicillin for 7 more days Surgeon planning myriad matrix next week   AKI on CKD- resolved    Thrombocytopenia due to infection -resolved   Transaminitis due to septic shock- resolved   CAD    Ischemic cardiomyopathy /AICD- plavix/aspirin On amiodarone stable  ? ? ___________________________________________________ Discussed with patient, and wife Follow PRN

## 2021-08-14 NOTE — Telephone Encounter (Signed)
Pt c/o medication issue:  1. Name of Medication: general   2. How are you currently taking this medication (dosage and times per day)? Please advise   3. Are you having a reaction (difficulty breathing--STAT)? no  4. What is your medication issue? Patient recently seen in hospital and routine meds changed. He wants to know when to resume medication regimen previously on before being admitted

## 2021-08-14 NOTE — Telephone Encounter (Signed)
Was able to return call to Ian Mills, advised has an upcoming appt the Friday 1/20 with Ignacia Bayley, NP, at that visit can discuss medication changes from recent hospital stay and want medication he can restart or stop at that time.   Mr. Dyar verbalized understanding, will continue current meds as prescribe at this time. Any changes will be discuss Friday. Mr. Landstrom thankful for the return call.

## 2021-08-15 NOTE — Telephone Encounter (Signed)
Transition Care Management Unsuccessful Follow-up Telephone Call  Date of discharge and from where:  08/07/21 [facility]  Attempts:  3rd Attempt  Reason for unsuccessful TCM follow-up call:  No answer/busy. HFU scheduled 08/17/21 @ 11:30. Keep all scheduled appointments. TCM attempt closed.

## 2021-08-16 ENCOUNTER — Encounter: Payer: Self-pay | Admitting: Nurse Practitioner

## 2021-08-16 ENCOUNTER — Telehealth: Payer: Self-pay | Admitting: Acute Care

## 2021-08-16 NOTE — Progress Notes (Deleted)
Cardiology Office Note:    Date:  08/16/2021   ID:  Ian Mills, DOB [birth year], MRN [id number]  PCP:  Ian Mills, Ian Glow, MD   [facility] Providers Cardiologist:  Ian Rogue, MD Electrophysiologist:  Ian Axe, MD { Click to update primary MD,subspecialty MD or APP then REFRESH:1}    Referring MD: Ian Mills, Ian Mills *   Chief Complaint: hospital f/u of CAD, chronic combined CHF  History of Present Illness:    Ian Mills is a 66 y.o. male with a hx of CAD s/p prior stenting to LAD, OM 3, RCA, and PDA,  chronic combined CHF, ischemic cardiomyopathy post AICD 2013, VT, HTN,  prior tobacco abuse, CKD  His cardiac history is significant for 2008 cardiac catheterization 3 stents placed,  mid LAD x 1, PDA x 1, OM 3 x 1 at [facility].  Cardiac catheterization at [facility] 11/2011. Cardiac catheterization 2014 @ [facility] revealed significant three-vessel coronary artery disease with chronically occluded LAD and patent stent in left circumflex, disease in RCA appeared worse than most recent cath, DES to 70% lesion proximal RCA, DES to 70% lesion mid RCA, moderately elevated LVEDP, anterior lateral akinesis and apical akinesis, global LV function severely depressed, EF estimated 20%. Follow-up echo 2019 revealed LVEF 30%. In January 2021, he underwent Lexiscan Myoview for anginal symptoms which showed large in size, moderate to severe, reversible mid and apical anterior, anterior septal, and anterior lateral defect that was consistent with scar and possible mild peri-infarct ischemia.  There was also moderate in size, moderate to severe, mid and apical fixed inferior/inferiolateral defect consistent with scar.  LVEF was less than 30% with mid and apical akinesis.  Aortic atherosclerosis, prior coronary stenting and ICD also noted. Scattered groundglass and ill-defined nodular opacities in bilateral lungs felt to be possibly reflected edema or atypical infection. The study  was felt to be overall high risk.  At follow-up office visit 08/18/2019 he continued to have same intensity of symptoms.  He elected to proceed with diagnostic cardiac catheterization which revealed progression of ostial left circumflex gnosis to 60%, otherwise stable CAD, moderately elevated LVEDP 25 to 30 mmHg with plan to continue aggressive medical therapy.   Last office visit was 05/02/21 with Dr. Rockey Mills at which time he was stable. Follow-up echo was completed 12/22 which revealed LVEF 25-30%, relatively unchanged from previous, recommendation to start spironolactone 25 mg and 6 month f/u was recommended.   Since that time, he was admitted 1/1-1/10/23 for left arm cellulitis w/strep infection with hospitalization complicated by AKI and thrombocytopenia. He initially required pressors to maintain BP. Creatinine peaked at 2.61 down to 1.08 at dc. Platelet count at 76 at its lowest, improved to 194.   CHF meds at d/c - low dose entresto, coreg, torsemide, amiodarone for VT BMET Last lipid 9/22 52  Past Medical History:  Diagnosis Date   Coronary artery disease    a. 3 stents to the LAD, 1 to the PDA, and 1 to the OM3; b. 06/2013 PCI: CTO LAD, patent LAD stent, RCA 70p (FFR 0.74--> DES), 75m (DES), RPDA 50%, EF 20%; c. 08/2019 Cath: LM nl, LAD 100p/m, D1 60, D2 patent stent, LCX 60ost/p, 60m ISR, RCA 30p, 73m ISR.   Family history of lung cancer    HFrEF (heart failure with reduced ejection fraction) ([place])    a. 05/2014 Echo: EF 25-30%; b. 01/2018 Echo ([facility]): EF 30%; c. 06/2021 Echo: EF 25-30%, apical AK, GrI DD, nl RV fxn.  HLD (hyperlipidemia)    HTN (hypertension)    ICD  single,BSX    a. DOI 11/2011; b. S/N# 235361   Ischemic cardiomyopathy    a. 05/2014 Echo: EF 25-30%; b. 01/2018 Echo ([facility]): EF 30%, c. 06/2021 Echo: EF 25-30%.   Personal history of colonic polyps    Pneumonia    08/2018   Syncope and collapse    Ventricular tachycardia 11/2011   a. s/p AICD implant     Past  Surgical History:  Procedure Laterality Date   appendectomy     CARDIAC CATHETERIZATION  Nov 30 2011   [facility]   CARDIAC DEFIBRILLATOR PLACEMENT  May 2013   COLONOSCOPY WITH PROPOFOL N/A 12/22/2020   Procedure: COLONOSCOPY WITH PROPOFOL;  Surgeon: Ian Bellows, MD;  Location: [facility] ENDOSCOPY;  Service: Gastroenterology;  Laterality: N/A;   CORONARY ANGIOPLASTY  2014   x 2 stents   CORONARY STENT PLACEMENT     x7   LEFT HEART CATH AND CORONARY ANGIOGRAPHY Left 08/30/2019   Procedure: LEFT HEART CATH AND CORONARY ANGIOGRAPHY;  Surgeon: Ian Hampshire, MD;  Location: [facility];  Service: Cardiovascular;  Laterality: Left;   PROSTATE SURGERY     urethra grew around prostate    TUMOR REMOVAL  2001   chest thymic cyst; behind lungs [facility] benign     Current Medications: No outpatient medications have been marked as taking for the 08/17/21 encounter (Appointment) with Ian Gianotti, NP.     Allergies:   Nitroglycerin   Social History   Socioeconomic History   Marital status: Married    Spouse name: Ian Mills    Number of children: 2   Years of education: Not on file   Highest education level: Not on file  Occupational History   Occupation: retired   Tobacco Use   Smoking status: Former    Packs/day: 0.50    Years: 42.00    Pack years: 21.00    Types: Cigarettes    Quit date: 11/10/2019    Years since quitting: 1.7   Smokeless tobacco: Never  Vaping Use   Vaping Use: Never used  Substance and Sexual Activity   Alcohol use: Yes    Alcohol/week: 8.0 standard drinks    Types: 8 Cans of beer per week    Comment: per week   Drug use: No   Sexual activity: Not on file  Other Topics Concern   Not on file  Social History Narrative   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    Lives in [place]    2 kids daughters    Gets regular exercise   Smoker   [employer]    Social Determinants of Health    Financial Resource Strain: Medium Risk   Difficulty of Paying Living Expenses: Somewhat hard  Food Insecurity: No Food Insecurity   Worried About Charity fundraiser in the Last Year: Never true   Arboriculturist in the Last Year: Never true  Transportation Needs: No Transportation Needs   Lack of Transportation (Medical): No   Lack of Transportation (Non-Medical): No  Physical Activity: Insufficiently Active   Days of Exercise per Week: 4 days   Minutes of Exercise per Session: 20 min  Stress: No Stress Concern Present   Feeling of Stress : Not at all  Social Connections: Unknown   Frequency of Communication with Friends and Family: More than  three times a week   Frequency of Social Gatherings with Friends and Family: Not on file   Attends Religious Services: Not on file   Active Member of Clubs or Organizations: Not on file   Attends Archivist Meetings: Not on file   Marital Status: Not on file     Family History: The patient's ***family history includes Cancer in his father; Cancer - Lung in his mother; Dementia in his maternal grandmother; Heart attack in his mother and paternal grandfather; Hypertension in his mother. There is no history of Colon cancer, Stomach cancer, or Pancreatic cancer.  ROS:   Please see the history of present illness.    *** All other systems reviewed and are negative.  Labs/Other Studies Reviewed:    The following studies were reviewed today:  Echo 07/12/21   FINDINGS   Left Ventricle: LV apex not well visualized. apex appears akinetic. could  not evaluate for thrombus presence due to image quality. limited echo with  enhancing agent recommended. Left ventricular ejection fraction, by  estimation, is 25 to 30%. The left ventricle has severely decreased function. Left ventricular endocardial border not optimally defined to evaluate regional wall motion. The left ventricular internal cavity size was normal in size. There is no left   ventricular hypertrophy. Left ventricular diastolic parameters are consistent with Grade I diastolic dysfunction (impaired relaxation).  Right Ventricle: The right ventricular size is normal. No increase in  right ventricular wall thickness. Right ventricular systolic function is  normal.  Left Atrium: Left atrial size was normal in size.  Right Atrium: Right atrial size was normal in size.  Pericardium: There is no evidence of pericardial effusion.  Mitral Valve: The mitral valve is normal in structure. No evidence of  mitral valve regurgitation.  Tricuspid Valve: The tricuspid valve is not well visualized. Tricuspid  valve regurgitation is not demonstrated.  Aortic Valve: The aortic valve is tricuspid. Aortic valve regurgitation is  not visualized. Aortic valve mean gradient measures 3.0 mmHg. Aortic valve  peak gradient measures 5.3 mmHg. Aortic valve area, by VTI measures 3.22  cm.  Pulmonic Valve: The pulmonic valve was normal in structure. Pulmonic valve  regurgitation is not visualized.  Aorta: The aortic root and ascending aorta are structurally normal, with  no evidence of dilitation.  Venous: The inferior vena cava is normal in size with greater than 50%  respiratory variability, suggesting right atrial pressure of 3 mmHg.  IAS/Shunts: No atrial level shunt detected by color flow Doppler.   LHC 2/21  Prox RCA to Mid RCA lesion is 20% stenosed. Prox RCA lesion is 30% stenosed. Prox LAD to Mid LAD lesion is 100% stenosed. 1st Diag-1 lesion is 60% stenosed. Previously placed 1st Diag-2 stent (unknown type) is widely patent. Ost Cx to Prox Cx lesion is 60% stenosed. Mid Cx lesion is 40% stenosed.   1. Significant underlying three-vessel coronary artery disease with chronically occluded mid LAD, patent first diagonal stent, patent RCA stents with mild in-stent restenosis, patent left circumflex stent with moderate in-stent restenosis. Progression of ostial left circumflex  stenosis to 60%. 2.  Left ventricular angiography was not performed due to chronic kidney disease. 3.  Moderately elevated left ventricular end-diastolic pressure between 25 to 30 mmHg.   Recommendations: Continue aggressive medical therapy.  No indication for revascularization. The patient is volume overloaded and will stop cath IV hydration.  Continue furosemide. Recommend checking renal function upon follow-up.   Nuclear stress 1/21  Abnormal, high risk pharmacologic  myocardial perfusion stress test. There is a large in size, moderate to severe, minimally reversible mid and apical anterior, anteroseptal, and anterolateral defect that is consistent with scar and possible mild periinfarct ischemia. There is a moderate in size, moderate to severe, mid and apical fixed inferior/inferolateral defect consistent with scar. The left ventricular ejection fraction is severely decreased (<30%) with mid and apical akinesis. This is a high risk study. Aortic atherosclerosis is noted, as well as sequelae of prior coronary stenting and pacemaker/ICD placement. There are scattered ground glass and ill-defined nodular opacities in both lungs, which could reflect edema or atypical infection. Clinical correlation and dedicated chest imaging are recommended.     Recent Labs: 04/06/2021: TSH 2.660 07/31/2021: Magnesium 2.4 08/06/2021: Hemoglobin 11.4; Platelets 194 08/07/2021: ALT 33; BUN 18; Creatinine, Ser 1.08; Potassium 4.1; Sodium 132  Recent Lipid Panel    Component Value Date/Time   CHOL 116 04/18/2021 1038   CHOL 170 12/09/2016 1103   CHOL 111 01/20/2014 0929   TRIG 93.0 04/18/2021 1038   TRIG 88 01/20/2014 0929   HDL 45.40 04/18/2021 1038   HDL 53 12/09/2016 1103   HDL 39 (L) 01/20/2014 0929   CHOLHDL 3 04/18/2021 1038   VLDL 18.6 04/18/2021 1038   VLDL 18 01/20/2014 0929   LDLCALC 52 04/18/2021 1038   LDLCALC 72 03/03/2020 1417   LDLCALC 54 01/20/2014 0929     Risk  Assessment/Calculations:   {Does this patient have ATRIAL FIBRILLATION?:[id number]}       Physical Exam:    VS:  There were no vitals taken for this visit.    Wt Readings from Last 3 Encounters:  08/14/21 198 lb (89.8 kg)  08/07/21 198 lb 10.2 oz (90.1 kg)  06/26/21 195 lb 9.6 oz (88.7 kg)     GEN: *** Well nourished, well developed in no acute distress HEENT: Normal NECK: No JVD; No carotid bruits CARDIAC: ***RRR, no murmurs, rubs, gallops RESPIRATORY:  Clear to auscultation without rales, wheezing or rhonchi  ABDOMEN: Soft, non-tender, non-distended MUSCULOSKELETAL:  No edema; No deformity. *** pedal pulses, ***bilaterally SKIN: Warm and dry NEUROLOGIC:  Alert and oriented x 3 PSYCHIATRIC:  Normal affect   EKG:  EKG is *** ordered today.  The ekg ordered today demonstrates ***  Diagnoses:    No diagnosis found. Assessment and Plan:     ***      {Are you ordering a CV Procedure (e.g. stress test, cath, DCCV, TEE, etc)?   Press F2        :[id number]}    Medication Adjustments/Labs and Tests Ordered: Current medicines are reviewed at length with the patient today.  Concerns regarding medicines are outlined above.  No orders of the defined types were placed in this encounter.  No orders of the defined types were placed in this encounter.   There are no Patient Instructions on file for this visit.   Signed, Emmaline Life, NP  08/16/2021 9:25 PM    [facility]

## 2021-08-16 NOTE — Telephone Encounter (Signed)
Contacted patient to schedule LDCT.  Patient requested to go tomorrow but nothing available and no time to precert.  Advised have some dates at [facility] for early Feb.  Patient states he will have to call back to schedule.

## 2021-08-17 ENCOUNTER — Ambulatory Visit: Payer: Medicare Other | Admitting: Nurse Practitioner

## 2021-08-17 ENCOUNTER — Encounter: Payer: Self-pay | Admitting: Internal Medicine

## 2021-08-17 ENCOUNTER — Other Ambulatory Visit: Payer: Self-pay

## 2021-08-17 ENCOUNTER — Ambulatory Visit (INDEPENDENT_AMBULATORY_CARE_PROVIDER_SITE_OTHER): Payer: Medicare Other | Admitting: Internal Medicine

## 2021-08-17 VITALS — BP 126/68 | HR 64 | Temp 98.0°F | Ht 69.0 in | Wt 198.0 lb

## 2021-08-17 DIAGNOSIS — R222 Localized swelling, mass and lump, trunk: Secondary | ICD-10-CM

## 2021-08-17 DIAGNOSIS — I5042 Chronic combined systolic (congestive) and diastolic (congestive) heart failure: Secondary | ICD-10-CM

## 2021-08-17 DIAGNOSIS — T148XXA Other injury of unspecified body region, initial encounter: Secondary | ICD-10-CM

## 2021-08-17 DIAGNOSIS — B950 Streptococcus, group A, as the cause of diseases classified elsewhere: Secondary | ICD-10-CM | POA: Diagnosis not present

## 2021-08-17 DIAGNOSIS — I5043 Acute on chronic combined systolic (congestive) and diastolic (congestive) heart failure: Secondary | ICD-10-CM | POA: Diagnosis not present

## 2021-08-17 DIAGNOSIS — I130 Hypertensive heart and chronic kidney disease with heart failure and stage 1 through stage 4 chronic kidney disease, or unspecified chronic kidney disease: Secondary | ICD-10-CM | POA: Diagnosis not present

## 2021-08-17 DIAGNOSIS — I2510 Atherosclerotic heart disease of native coronary artery without angina pectoris: Secondary | ICD-10-CM | POA: Diagnosis not present

## 2021-08-17 DIAGNOSIS — L03114 Cellulitis of left upper limb: Secondary | ICD-10-CM | POA: Diagnosis not present

## 2021-08-17 DIAGNOSIS — N1831 Chronic kidney disease, stage 3a: Secondary | ICD-10-CM | POA: Diagnosis not present

## 2021-08-17 MED ORDER — OXYCODONE-ACETAMINOPHEN 5-325 MG PO TABS: 1.0000 | ORAL_TABLET | Freq: Two times a day (BID) | ORAL | 0 refills | Status: DC

## 2021-08-17 MED ORDER — MUPIROCIN 2 % EX OINT: 1.0000 "application " | TOPICAL_OINTMENT | Freq: Two times a day (BID) | CUTANEOUS | 2 refills | Status: DC

## 2021-08-17 MED ORDER — SILVER SULFADIAZINE 1 % EX CREA: TOPICAL_CREAM | Freq: Two times a day (BID) | CUTANEOUS | 2 refills | Status: DC

## 2021-08-17 NOTE — Progress Notes (Addendum)
Chief Complaint  Patient presents with   Hospitalization Follow-up   HFU 07/29/21-08/07/21 severe sepsis with septic shock due to left arm wound  Left arm wound with cellulitis with  septic shock and hypotension will have skin graft 07/30/21 Dr. Cristela Blue and following with ID locally been on ancef, cleocin, zyvox finished this am and augmentin 500 mg bid will start today  C/o left arm pain 7/10   Chronic heart failure stable but medications changed due hypotension d/c lasix 40 and spironolactone 25 and entresto changed 97-103 to 24-26 1/2 pill on toresemide 20 mg qd   C/o mid back mass not painful just noticed and new   Review of Systems  Constitutional:  Negative for weight loss.  HENT:  Negative for hearing loss.   Eyes:  Negative for blurred vision.  Respiratory:  Negative for shortness of breath.   Cardiovascular:  Negative for chest pain.  Gastrointestinal:  Negative for abdominal pain and blood in stool.  Musculoskeletal:  Negative for back pain.  Skin:  Negative for rash.       Left wound open wound  Neurological:  Negative for headaches.  Psychiatric/Behavioral:  Negative for depression.   Past Medical History:  Diagnosis Date   Coronary artery disease    a. 3 stents to the LAD, 1 to the PDA, and 1 to the OM3; b. 06/2013 PCI: CTO LAD, patent LAD stent, RCA 70p (FFR 0.74--> DES), 1m (DES), RPDA 50%, EF 20%; c. 08/2019 Cath: LM nl, LAD 100p/m, D1 60, D2 patent stent, LCX 60ost/p, 16m ISR, RCA 30p, 64m ISR.   Family history of lung cancer    HFrEF (heart failure with reduced ejection fraction) ([place])    a. 05/2014 Echo: EF 25-30%; b. 01/2018 Echo ([facility]): EF 30%; c. 06/2021 Echo: EF 25-30%, apical AK, GrI DD, nl RV fxn.   HLD (hyperlipidemia)    HTN (hypertension)    ICD  single,BSX    a. DOI 11/2011; b. S/N# [id number]   Ischemic cardiomyopathy    a. 05/2014 Echo: EF 25-30%; b. 01/2018 Echo ([facility]): EF 30%, c. 06/2021 Echo: EF 25-30%.   Personal history of colonic polyps    Pneumonia     08/2018   Syncope and collapse    Ventricular tachycardia 11/2011   a. s/p AICD implant    Past Surgical History:  Procedure Laterality Date   appendectomy     CARDIAC CATHETERIZATION  Nov 30 2011   [facility]   CARDIAC DEFIBRILLATOR PLACEMENT  May 2013   COLONOSCOPY WITH PROPOFOL N/A 12/22/2020   Procedure: COLONOSCOPY WITH PROPOFOL;  Surgeon: Jonathon Bellows, MD;  Location: [facility] ENDOSCOPY;  Service: Gastroenterology;  Laterality: N/A;   CORONARY ANGIOPLASTY  2014   x 2 stents   CORONARY STENT PLACEMENT     x7   LEFT HEART CATH AND CORONARY ANGIOGRAPHY Left 08/30/2019   Procedure: LEFT HEART CATH AND CORONARY ANGIOGRAPHY;  Surgeon: Wellington Hampshire, MD;  Location: [facility];  Service: Cardiovascular;  Laterality: Left;   PROSTATE SURGERY     urethra grew around prostate    TUMOR REMOVAL  2001   chest thymic cyst; behind lungs [facility] benign    Family History  Problem Relation Age of Onset   Cancer - Lung Mother    Hypertension Mother    Heart attack Mother    Cancer Father        larynx   Dementia Maternal Grandmother    Heart attack Paternal Grandfather  Colon cancer Neg Hx    Stomach cancer Neg Hx    Pancreatic cancer Neg Hx    Social History   Socioeconomic History   Marital status: Married    Spouse name: Hilda Blades    Number of children: 2   Years of education: Not on file   Highest education level: Not on file  Occupational History   Occupation: retired   Tobacco Use   Smoking status: Former    Packs/day: 0.50    Years: 42.00    Pack years: 21.00    Types: Cigarettes    Quit date: 11/10/2019    Years since quitting: 1.7   Smokeless tobacco: Never  Vaping Use   Vaping Use: Never used  Substance and Sexual Activity   Alcohol use: Yes    Alcohol/week: 8.0 standard drinks    Types: 8 Cans of beer per week    Comment: per week   Drug use: No   Sexual activity: Not on file  Other Topics Concern   Not on file  Social History Narrative   Married to wife she is  DPR   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    Lives in [place]    2 kids daughters    Gets regular exercise   Smoker   [employer]    Social Determinants of Health   Financial Resource Strain: Medium Risk   Difficulty of Paying Living Expenses: Somewhat hard  Food Insecurity: No Food Insecurity   Worried About Charity fundraiser in the Last Year: Never true   Arboriculturist in the Last Year: Never true  Transportation Needs: No Transportation Needs   Lack of Transportation (Medical): No   Lack of Transportation (Non-Medical): No  Physical Activity: Insufficiently Active   Days of Exercise per Week: 4 days   Minutes of Exercise per Session: 20 min  Stress: No Stress Concern Present   Feeling of Stress : Not at all  Social Connections: Unknown   Frequency of Communication with Friends and Family: More than three times a week   Frequency of Social Gatherings with Friends and Family: Not on file   Attends Religious Services: Not on Electrical engineer or Organizations: Not on file   Attends Archivist Meetings: Not on file   Marital Status: Not on file  Intimate Partner Violence: Not At Risk   Fear of Current or Ex-Partner: No   Emotionally Abused: No   Physically Abused: No   Sexually Abused: No   Current Meds  Medication Sig   ALPRAZolam (XANAX) 0.5 MG tablet Take 1 tablet (0.5 mg total) by mouth 3 (three) times daily as needed. for anxiety   amiodarone (PACERONE) 200 MG tablet Take 1 tablet (200 mg total) by mouth daily.   amoxicillin (AMOXIL) 500 MG capsule Take 1 capsule (500 mg total) by mouth 3 (three) times daily.   aspirin 81 MG EC tablet Take 1 tablet (81 mg total) by mouth daily.   atorvastatin (LIPITOR) 80 MG tablet Take 1 tablet (80 mg total) by mouth daily.   carvedilol (COREG) 3.125 MG tablet Take 1 tablet (3.125 mg total) by mouth 2 (two) times daily.   clopidogrel (PLAVIX) 75 MG tablet Take 1  tablet (75 mg total) by mouth daily.   ezetimibe (ZETIA) 10 MG tablet Take 1 tablet (10 mg total) by mouth daily.   mupirocin ointment (BACTROBAN) 2 % Apply  1 application topically 2 (two) times daily.   oxyCODONE-acetaminophen (PERCOCET/ROXICET) 5-325 MG tablet Take 1 tablet by mouth 2 (two) times daily.   potassium chloride (KLOR-CON 10) 10 MEQ tablet Take 1 tablet (10 mEq total) by mouth daily.   sacubitril-valsartan (ENTRESTO) 24-26 MG Take 1 tablet by mouth 2 (two) times daily. (Patient taking differently: Take 0.5 tablets by mouth 2 (two) times daily.)   tadalafil (CIALIS) 10 MG tablet Take 1-2 tablets (10-20 mg total) by mouth daily as needed for erectile dysfunction.   tiotropium (SPIRIVA) 18 MCG inhalation capsule Place 18 mcg into inhaler and inhale daily.   torsemide (DEMADEX) 20 MG tablet Take 1 tablet (20 mg total) by mouth daily.   [DISCONTINUED] silver sulfADIAZINE (SILVADENE) 1 % cream Apply topically 2 (two) times daily.   Allergies  Allergen Reactions   Nitroglycerin Nausea Only and Other (See Comments)    Per patient "causes severe  headache"   Recent Results (from the past 2160 hour(s))  PSA     Status: None   Collection Time: 06/26/21  5:19 PM  Result Value Ref Range   Prostatic Specific Antigen 0.74 0.00 - 4.00 ng/mL    Comment: (NOTE) While PSA levels of <=4.0 ng/ml are reported as reference range, some men with levels below 4.0 ng/ml can have prostate cancer and many men with PSA above 4.0 ng/ml do not have prostate cancer.  Other tests such as free PSA, age specific reference ranges, PSA velocity and PSA doubling time may be helpful especially in men less than 10 years old. Performed at [facility], [place] [address] [place], [place] [address]   CBC with Differential/Platelet     Status: None   Collection Time: 06/26/21  5:19 PM  Result Value Ref Range   WBC 9.2 4.0 - 10.5 K/uL   RBC 5.06 4.22 - 5.81 MIL/uL   Hemoglobin 15.0 13.0 - 17.0 g/dL   HCT  46.1 39.0 - 52.0 %   MCV 91.1 80.0 - 100.0 fL   MCH 29.6 26.0 - 34.0 pg   MCHC 32.5 30.0 - 36.0 g/dL   RDW 14.2 11.5 - 15.5 %   Platelets 173 150 - 400 K/uL   nRBC 0.0 0.0 - 0.2 %   Neutrophils Relative % 48 %   Neutro Abs 4.5 1.7 - 7.7 K/uL   Lymphocytes Relative 40 %   Lymphs Abs 3.7 0.7 - 4.0 K/uL   Monocytes Relative 8 %   Monocytes Absolute 0.8 0.1 - 1.0 K/uL   Eosinophils Relative 3 %   Eosinophils Absolute 0.2 0.0 - 0.5 K/uL   Basophils Relative 1 %   Basophils Absolute 0.1 0.0 - 0.1 K/uL   Immature Granulocytes 0 %   Abs Immature Granulocytes 0.03 0.00 - 0.07 K/uL    Comment: Performed at [facility], [address] [place],  [address]  Comprehensive metabolic panel     Status: None   Collection Time: 06/26/21  5:19 PM  Result Value Ref Range   Sodium 136 135 - 145 mmol/L   Potassium 3.9 3.5 - 5.1 mmol/L   Chloride 105 98 - 111 mmol/L   CO2 24 22 - 32 mmol/L   Glucose, Bld 74 70 - 99 mg/dL    Comment: Glucose reference range applies only to samples taken after fasting for at least 8 hours.   BUN 23 8 - 23 mg/dL   Creatinine, Ser 1.24 0.61 - 1.24 mg/dL   Calcium 9.0 8.9 -  10.3 mg/dL   Total Protein 7.7 6.5 - 8.1 g/dL   Albumin 4.1 3.5 - 5.0 g/dL   AST 31 15 - 41 U/L   ALT 43 0 - 44 U/L   Alkaline Phosphatase 99 38 - 126 U/L   Total Bilirubin 0.9 0.3 - 1.2 mg/dL   GFR, Estimated >60 >60 mL/min    Comment: (NOTE) Calculated using the CKD-EPI Creatinine Equation (2021)    Anion gap 7 5 - 15    Comment: Performed at [facility], [address] [place], [place] [address]  Culture, blood (routine x 2)     Status: None   Collection Time: 06/26/21  5:20 PM   Specimen: BLOOD  Result Value Ref Range   Specimen Description BLOOD BLOOD RIGHT HAND    Special Requests      BOTTLES DRAWN AEROBIC AND ANAEROBIC Blood Culture results may not be optimal due to an inadequate volume of blood received in culture bottles   Culture      NO GROWTH 5  DAYS Performed at [facility], [address] [place], [place] [address]    Report Status 07/01/2021 FINAL   ECHOCARDIOGRAM COMPLETE     Status: None   Collection Time: 07/12/21  3:56 PM  Result Value Ref Range   AR max vel 3.22 cm2   AV Peak grad 5.3 mmHg   Ao pk vel 1.15 m/s   Area-P 1/2 2.09 cm2   AV Area VTI 3.22 cm2   AV Mean grad 3.0 mmHg   AV Area mean vel 3.17 cm2  Comprehensive metabolic panel     Status: Abnormal   Collection Time: 07/29/21  6:30 PM  Result Value Ref Range   Sodium 131 (L) 135 - 145 mmol/L   Potassium 4.1 3.5 - 5.1 mmol/L   Chloride 99 98 - 111 mmol/L   CO2 19 (L) 22 - 32 mmol/L   Glucose, Bld 68 (L) 70 - 99 mg/dL    Comment: Glucose reference range applies only to samples taken after fasting for at least 8 hours.   BUN 37 (H) 8 - 23 mg/dL   Creatinine, Ser 2.61 (H) 0.61 - 1.24 mg/dL   Calcium 8.9 8.9 - 10.3 mg/dL   Total Protein 7.6 6.5 - 8.1 g/dL   Albumin 3.8 3.5 - 5.0 g/dL   AST 104 (H) 15 - 41 U/L   ALT 90 (H) 0 - 44 U/L   Alkaline Phosphatase 103 38 - 126 U/L   Total Bilirubin 1.5 (H) 0.3 - 1.2 mg/dL   GFR, Estimated 26 (L) >60 mL/min    Comment: (NOTE) Calculated using the CKD-EPI Creatinine Equation (2021)    Anion gap 13 5 - 15    Comment: Performed at [facility], [address] [place], [place] [address]  CBC with Differential     Status: Abnormal   Collection Time: 07/29/21  6:30 PM  Result Value Ref Range   WBC 17.4 (H) 4.0 - 10.5 K/uL   RBC 5.19 4.22 - 5.81 MIL/uL   Hemoglobin 15.5 13.0 - 17.0 g/dL   HCT 46.9 39.0 - 52.0 %   MCV 90.4 80.0 - 100.0 fL   MCH 29.9 26.0 - 34.0 pg   MCHC 33.0 30.0 - 36.0 g/dL   RDW 14.3 11.5 - 15.5 %   Platelets 142 (L) 150 - 400 K/uL   nRBC 0.0 0.0 - 0.2 %   Neutrophils Relative % 94 %   Neutro Abs 16.2 (  H) 1.7 - 7.7 K/uL   Lymphocytes Relative 3 %   Lymphs Abs 0.5 (L) 0.7 - 4.0 K/uL   Monocytes Relative 2 %   Monocytes Absolute 0.4 0.1 - 1.0 K/uL   Eosinophils  Relative 0 %   Eosinophils Absolute 0.0 0.0 - 0.5 K/uL   Basophils Relative 0 %   Basophils Absolute 0.1 0.0 - 0.1 K/uL   WBC Morphology MILD LEFT SHIFT (1-5% METAS, OCC MYELO, OCC BANDS)    RBC Morphology MORPHOLOGY UNREMARKABLE    Smear Review Normal platelet morphology    Immature Granulocytes 1 %   Abs Immature Granulocytes 0.25 (H) 0.00 - 0.07 K/uL    Comment: Performed at [facility], [place], [place] [address]  Troponin I (High Sensitivity)     Status: Abnormal   Collection Time: 07/29/21  6:30 PM  Result Value Ref Range   Troponin I (High Sensitivity) 34 (H) <18 ng/L    Comment: (NOTE) Elevated high sensitivity troponin I (hsTnI) values and significant  changes across serial measurements may suggest ACS but many other  chronic and acute conditions are known to elevate hsTnI results.  Refer to the "Links" section for chest pain algorithms and additional  guidance. Performed at [facility], [address] [place], [place] [address]   Resp Panel by RT-PCR (Flu A&B, Covid) Nasopharyngeal Swab     Status: None   Collection Time: 07/29/21  7:43 PM   Specimen: Nasopharyngeal Swab; Nasopharyngeal(NP) swabs in vial transport medium  Result Value Ref Range   SARS Coronavirus 2 by RT PCR NEGATIVE NEGATIVE    Comment: (NOTE) SARS-CoV-2 target nucleic acids are NOT DETECTED.  The SARS-CoV-2 RNA is generally detectable in upper respiratory specimens during the acute phase of infection. The lowest concentration of SARS-CoV-2 viral copies this assay can detect is 138 copies/mL. A negative result does not preclude SARS-Cov-2 infection and should not be used as the sole basis for treatment or other patient management decisions. A negative result may occur with  improper specimen collection/handling, submission of specimen other than nasopharyngeal swab, presence of viral mutation(s) within the areas targeted by this assay, and inadequate number of  viral copies(<138 copies/mL). A negative result must be combined with clinical observations, patient history, and epidemiological information. The expected result is Negative.  Fact Sheet for Patients:  EntrepreneurPulse.com.au  Fact Sheet for Healthcare Providers:  IncredibleEmployment.be  This test is no t yet approved or cleared by the [place] FDA and  has been authorized for detection and/or diagnosis of SARS-CoV-2 by FDA under an Emergency Use Authorization (EUA). This EUA will remain  in effect (meaning this test can be used) for the duration of the COVID-19 declaration under Section 564(b)(1) of the Act, 21 U.S.C.section 360bbb-3(b)(1), unless the authorization is terminated  or revoked sooner.       Influenza A by PCR NEGATIVE NEGATIVE   Influenza B by PCR NEGATIVE NEGATIVE    Comment: (NOTE) The Xpert Xpress SARS-CoV-2/FLU/RSV plus assay is intended as an aid in the diagnosis of influenza from Nasopharyngeal swab specimens and should not be used as a sole basis for treatment. Nasal washings and aspirates are unacceptable for Xpert Xpress SARS-CoV-2/FLU/RSV testing.  Fact Sheet for Patients: EntrepreneurPulse.com.au  Fact Sheet for Healthcare Providers: IncredibleEmployment.be  This test is not yet approved or cleared by the [place] FDA and has been authorized for detection and/or diagnosis of SARS-CoV-2 by FDA under an Emergency Use Authorization (EUA). This EUA will  remain in effect (meaning this test can be used) for the duration of the COVID-19 declaration under Section 564(b)(1) of the Act, 21 U.S.C. section 360bbb-3(b)(1), unless the authorization is terminated or revoked.  Performed at [facility], [address] [place], [place] [address]   Culture, blood (Routine X 2) w Reflex to ID Panel     Status: Abnormal   Collection Time: 07/29/21  8:17 PM   Specimen:  BLOOD  Result Value Ref Range   Specimen Description      BLOOD BLOOD RIGHT HAND Performed at [facility], [address] [place], [place] [address]    Special Requests      BOTTLES DRAWN AEROBIC AND ANAEROBIC Blood Culture results may not be optimal due to an inadequate volume of blood received in culture bottles Performed at [facility], [address], [place], [place] [address]    Culture  Setup Time      [place].  VALUE IS CONSISTENT WITH PREVIOUSLY REPORTED AND CALLED VALUE. Performed at [facility], [place], [place] [address]    Culture (A)     GROUP A STREP (S.PYOGENES) ISOLATED SUSCEPTIBILITIES PERFORMED ON PREVIOUS CULTURE WITHIN THE LAST 5 DAYS. Performed at [facility], [place] [address], [place], [place] [address]    Report Status 08/01/2021 FINAL   Culture, blood (Routine X 2) w Reflex to ID Panel     Status: Abnormal   Collection Time: 07/29/21  8:17 PM   Specimen: BLOOD  Result Value Ref Range   Specimen Description      BLOOD LEFT ANTECUBITAL Performed at [facility], [address] [place], [place] [address]    Special Requests      BOTTLES DRAWN AEROBIC AND ANAEROBIC Blood Culture results may not be optimal due to an inadequate volume of blood received in culture bottles Performed at [facility], [address], [place], [place] [address]    Culture  Setup Time      GRAM POSITIVE COCCI IN BOTH AEROBIC AND ANAEROBIC BOTTLES Organism ID to follow CRITICAL RESULT CALLED TO, READ BACK BY AND VERIFIED WITHArdeen Garland [facility] [id number] 07/30/21 HNM Performed at [facility], [place], [place] [address]    Culture (A)     GROUP A STREP (S.PYOGENES) [place] NOTIFIED Performed at [facility], [place] [address] [place], [place] [address]    Report Status 08/01/2021 FINAL    Organism ID,  Bacteria GROUP A STREP (S.PYOGENES) ISOLATED       Susceptibility   Group a strep (s.pyogenes) isolated - MIC*    PENICILLIN <=0.06 SENSITIVE Sensitive     CEFTRIAXONE <=0.12 SENSITIVE Sensitive     ERYTHROMYCIN <=0.12 SENSITIVE Sensitive     LEVOFLOXACIN 0.5 SENSITIVE Sensitive     VANCOMYCIN 0.5 SENSITIVE Sensitive     * GROUP A STREP (S.PYOGENES) ISOLATED  Blood Culture ID Panel (Reflexed)     Status: Abnormal   Collection Time: 07/29/21  8:17 PM  Result Value Ref Range   Enterococcus faecalis NOT DETECTED NOT DETECTED   Enterococcus Faecium NOT DETECTED NOT DETECTED   Listeria monocytogenes NOT DETECTED NOT DETECTED   Staphylococcus species NOT DETECTED NOT DETECTED   Staphylococcus aureus (BCID) NOT DETECTED NOT DETECTED   Staphylococcus epidermidis NOT DETECTED NOT DETECTED   Staphylococcus lugdunensis NOT DETECTED NOT DETECTED   Streptococcus species DETECTED (A) NOT DETECTED  Comment: CRITICAL RESULT CALLED TO, READ BACK BY AND VERIFIED WITH: [name] HICKS PHARMD [id number] 07/30/21 HNM    Streptococcus agalactiae NOT DETECTED NOT DETECTED   Streptococcus pneumoniae NOT DETECTED NOT DETECTED   Streptococcus pyogenes DETECTED (A) NOT DETECTED    Comment: CRITICAL RESULT CALLED TO, READ BACK BY AND VERIFIED WITH: Ardeen Garland PHARMD [id number] 07/30/21 HNM    A.calcoaceticus-baumannii NOT DETECTED NOT DETECTED   Bacteroides fragilis NOT DETECTED NOT DETECTED   Enterobacterales NOT DETECTED NOT DETECTED   Enterobacter cloacae complex NOT DETECTED NOT DETECTED   Escherichia coli NOT DETECTED NOT DETECTED   Klebsiella aerogenes NOT DETECTED NOT DETECTED   Klebsiella oxytoca NOT DETECTED NOT DETECTED   Klebsiella pneumoniae NOT DETECTED NOT DETECTED   Proteus species NOT DETECTED NOT DETECTED   Salmonella species NOT DETECTED NOT DETECTED   Serratia marcescens NOT DETECTED NOT DETECTED   Haemophilus influenzae NOT DETECTED NOT DETECTED   Neisseria meningitidis NOT DETECTED NOT DETECTED    Pseudomonas aeruginosa NOT DETECTED NOT DETECTED   Stenotrophomonas maltophilia NOT DETECTED NOT DETECTED   Candida albicans NOT DETECTED NOT DETECTED   Candida auris NOT DETECTED NOT DETECTED   Candida glabrata NOT DETECTED NOT DETECTED   Candida krusei NOT DETECTED NOT DETECTED   Candida parapsilosis NOT DETECTED NOT DETECTED   Candida tropicalis NOT DETECTED NOT DETECTED   Cryptococcus neoformans/gattii NOT DETECTED NOT DETECTED    Comment: Performed at [facility], [address] [place], [place] [address]  Troponin I (High Sensitivity)     Status: Abnormal   Collection Time: 07/29/21  8:26 PM  Result Value Ref Range   Troponin I (High Sensitivity) 31 (H) <18 ng/L    Comment: (NOTE) Elevated high sensitivity troponin I (hsTnI) values and significant  changes across serial measurements may suggest ACS but many other  chronic and acute conditions are known to elevate hsTnI results.  Refer to the "Links" section for chest pain algorithms and additional  guidance. Performed at [facility], [address] [place], [place] [address]   Lactic acid, plasma     Status: Abnormal   Collection Time: 07/29/21  8:26 PM  Result Value Ref Range   Lactic Acid, Venous 3.2 (HH) 0.5 - 1.9 mmol/L    Comment: CRITICAL RESULT CALLED TO, READ BACK BY AND VERIFIED WITH [facility] MARTINEZ AT 2142 07/29/2021 DLB Performed at [facility], [address] [place], [place] [address]   Procalcitonin - Baseline     Status: None   Collection Time: 07/29/21  8:26 PM  Result Value Ref Range   Procalcitonin 27.38 ng/mL    Comment:        Interpretation: PCT >= 10 ng/mL: Important systemic inflammatory response, almost exclusively due to severe bacterial sepsis or septic shock. (NOTE)       Sepsis PCT Algorithm           Lower Respiratory Tract                                      Infection PCT Algorithm    ----------------------------     ----------------------------          PCT < 0.25 ng/mL                PCT < 0.10 ng/mL          Strongly encourage  Strongly discourage   discontinuation of antibiotics    initiation of antibiotics    ----------------------------     -----------------------------       PCT 0.25 - 0.50 ng/mL            PCT 0.10 - 0.25 ng/mL               OR       >80% decrease in PCT            Discourage initiation of                                            antibiotics      Encourage discontinuation           of antibiotics    ----------------------------     -----------------------------         PCT >= 0.50 ng/mL              PCT 0.26 - 0.50 ng/mL                AND       <80% decrease in PCT             Encourage initiation of                                             antibiotics       Encourage continuation           of antibiotics    ----------------------------     -----------------------------        PCT >= 0.50 ng/mL                  PCT > 0.50 ng/mL               AND         increase in PCT                  Strongly encourage                                      initiation of antibiotics    Strongly encourage escalation           of antibiotics                                     -----------------------------                                           PCT <= 0.25 ng/mL                                                 OR                                        >  80% decrease in PCT                                      Discontinue / Do not initiate                                             antibiotics  Performed at [facility], [address] [place], [place] [address]   Magnesium     Status: Abnormal   Collection Time: 07/29/21  8:26 PM  Result Value Ref Range   Magnesium 1.5 (L) 1.7 - 2.4 mg/dL    Comment: Performed at [facility], [address] [place], [place] [address]  Phosphorus     Status: None   Collection Time: 07/29/21  8:26 PM  Result Value Ref Range   Phosphorus 3.4  2.5 - 4.6 mg/dL    Comment: Performed at [facility], [address] [place], [place] [address]  CBG monitoring, ED     Status: Abnormal   Collection Time: 07/29/21 11:19 PM  Result Value Ref Range   Glucose-Capillary 69 (L) 70 - 99 mg/dL    Comment: Glucose reference range applies only to samples taken after fasting for at least 8 hours.  Lactic acid, plasma     Status: Abnormal   Collection Time: 07/30/21 12:29 AM  Result Value Ref Range   Lactic Acid, Venous 2.8 (HH) 0.5 - 1.9 mmol/L    Comment: CRITICAL VALUE NOTED. VALUE IS CONSISTENT WITH PREVIOUSLY REPORTED/CALLED VALUE DLB Performed at [facility], [address] [place], [place] [address]   CBG monitoring, ED     Status: None   Collection Time: 07/30/21 12:35 AM  Result Value Ref Range   Glucose-Capillary 97 70 - 99 mg/dL    Comment: Glucose reference range applies only to samples taken after fasting for at least 8 hours.  Aerobic Culture w Gram Stain (superficial specimen)     Status: None   Collection Time: 07/30/21  2:05 AM   Specimen: Arm; Wound  Result Value Ref Range   Specimen Description      ARM LEFT Performed at [facility], [address] [place], [place] [address]    Special Requests      NONE Performed at [facility], [place], [place] [address]    Gram Stain      FEW NO SQUAMOUS EPITHELIAL CELLS SEEN FEW WBC SEEN FEW GRAM POSITIVE COCCI    Culture      ABUNDANT STREPTOCOCCUS PYOGENES Beta hemolytic streptococci are predictably susceptible to penicillin and other beta lactams. Susceptibility testing not routinely performed. Performed at [facility], [place] [address], [place], [place] [address]    Report Status 07/31/2021 FINAL   Cortisol, Random     Status: None   Collection Time: 07/30/21  2:05 AM  Result Value Ref Range   Cortisol, Plasma 37.7 ug/dL    Comment: (NOTE) AM    6.7 - 22.6 ug/dL PM   <10.0       ug/dL Performed at  [facility] [address], [place], [place] [address]   Glucose, capillary     Status: None   Collection Time: 07/30/21  5:10 AM  Result Value Ref Range   Glucose-Capillary  88 70 - 99 mg/dL    Comment: Glucose reference range applies only to samples taken after fasting for at least 8 hours.   Comment 1 Notify RN    Comment 2 Document in Chart   Phosphorus     Status: None   Collection Time: 07/30/21  5:27 AM  Result Value Ref Range   Phosphorus 3.1 2.5 - 4.6 mg/dL    Comment: Performed at [facility], [address] [place], [place] [address]  Magnesium     Status: Abnormal   Collection Time: 07/30/21  5:27 AM  Result Value Ref Range   Magnesium 2.6 (H) 1.7 - 2.4 mg/dL    Comment: Performed at [facility], [address] [place], [place] [address]  CBC     Status: Abnormal   Collection Time: 07/30/21  5:27 AM  Result Value Ref Range   WBC 20.3 (H) 4.0 - 10.5 K/uL   RBC 4.56 4.22 - 5.81 MIL/uL   Hemoglobin 13.6 13.0 - 17.0 g/dL   HCT 41.5 39.0 - 52.0 %   MCV 91.0 80.0 - 100.0 fL   MCH 29.8 26.0 - 34.0 pg   MCHC 32.8 30.0 - 36.0 g/dL   RDW 14.5 11.5 - 15.5 %   Platelets 122 (L) 150 - 400 K/uL    Comment: Immature Platelet Fraction may be clinically indicated, consider ordering this additional test [id number] REPEATED TO VERIFY    nRBC 0.0 0.0 - 0.2 %    Comment: Performed at [facility], [address] [place], [place] [address]  Comprehensive metabolic panel     Status: Abnormal   Collection Time: 07/30/21  5:27 AM  Result Value Ref Range   Sodium 133 (L) 135 - 145 mmol/L   Potassium 4.1 3.5 - 5.1 mmol/L   Chloride 101 98 - 111 mmol/L   CO2 20 (L) 22 - 32 mmol/L   Glucose, Bld 93 70 - 99 mg/dL    Comment: Glucose reference range applies only to samples taken after fasting for at least 8 hours.   BUN 40 (H) 8 - 23 mg/dL   Creatinine, Ser 2.48 (H) 0.61 - 1.24 mg/dL   Calcium 8.0 (L) 8.9 - 10.3 mg/dL   Total Protein 6.3 (L)  6.5 - 8.1 g/dL   Albumin 3.0 (L) 3.5 - 5.0 g/dL   AST 105 (H) 15 - 41 U/L   ALT 106 (H) 0 - 44 U/L   Alkaline Phosphatase 94 38 - 126 U/L   Total Bilirubin 1.6 (H) 0.3 - 1.2 mg/dL   GFR, Estimated 28 (L) >60 mL/min    Comment: (NOTE) Calculated using the CKD-EPI Creatinine Equation (2021)    Anion gap 12 5 - 15    Comment: Performed at [facility], [address] [place], [place] [address]  Procalcitonin     Status: None   Collection Time: 07/30/21  5:27 AM  Result Value Ref Range   Procalcitonin 18.93 ng/mL    Comment:        Interpretation: PCT >= 10 ng/mL: Important systemic inflammatory response, almost exclusively due to severe bacterial sepsis or septic shock. (NOTE)       Sepsis PCT Algorithm           Lower Respiratory Tract  Infection PCT Algorithm    ----------------------------     ----------------------------         PCT < 0.25 ng/mL                PCT < 0.10 ng/mL          Strongly encourage             Strongly discourage   discontinuation of antibiotics    initiation of antibiotics    ----------------------------     -----------------------------       PCT 0.25 - 0.50 ng/mL            PCT 0.10 - 0.25 ng/mL               OR       >80% decrease in PCT            Discourage initiation of                                            antibiotics      Encourage discontinuation           of antibiotics    ----------------------------     -----------------------------         PCT >= 0.50 ng/mL              PCT 0.26 - 0.50 ng/mL                AND       <80% decrease in PCT             Encourage initiation of                                             antibiotics       Encourage continuation           of antibiotics    ----------------------------     -----------------------------        PCT >= 0.50 ng/mL                  PCT > 0.50 ng/mL               AND         increase in PCT                  Strongly encourage                                       initiation of antibiotics    Strongly encourage escalation           of antibiotics                                     -----------------------------                                           PCT <= 0.25 ng/mL  OR                                        > 80% decrease in PCT                                      Discontinue / Do not initiate                                             antibiotics  Performed at [facility], [address] [place], [place] [address]   HIV Antibody (routine testing w rflx)     Status: None   Collection Time: 07/30/21  5:27 AM  Result Value Ref Range   HIV Screen 4th Generation wRfx Non Reactive Non Reactive    Comment: Performed at [facility], [place] [address] [place], [place] [address]  Procalcitonin     Status: None   Collection Time: 07/31/21  5:20 AM  Result Value Ref Range   Procalcitonin 5.47 ng/mL    Comment:        Interpretation: PCT > 2 ng/mL: Systemic infection (sepsis) is likely, unless other causes are known. (NOTE)       Sepsis PCT Algorithm           Lower Respiratory Tract                                      Infection PCT Algorithm    ----------------------------     ----------------------------         PCT < 0.25 ng/mL                PCT < 0.10 ng/mL          Strongly encourage             Strongly discourage   discontinuation of antibiotics    initiation of antibiotics    ----------------------------     -----------------------------       PCT 0.25 - 0.50 ng/mL            PCT 0.10 - 0.25 ng/mL               OR       >80% decrease in PCT            Discourage initiation of                                            antibiotics      Encourage discontinuation           of antibiotics    ----------------------------     -----------------------------         PCT >= 0.50 ng/mL              PCT 0.26 - 0.50 ng/mL               AND        <80% decrease in PCT  Encourage initiation of                                             antibiotics       Encourage continuation           of antibiotics    ----------------------------     -----------------------------        PCT >= 0.50 ng/mL                  PCT > 0.50 ng/mL               AND         increase in PCT                  Strongly encourage                                      initiation of antibiotics    Strongly encourage escalation           of antibiotics                                     -----------------------------                                           PCT <= 0.25 ng/mL                                                 OR                                        > 80% decrease in PCT                                      Discontinue / Do not initiate                                             antibiotics  Performed at [facility], [address] [place], [place] [address]   Comprehensive metabolic panel     Status: Abnormal   Collection Time: 07/31/21  5:20 AM  Result Value Ref Range   Sodium 134 (L) 135 - 145 mmol/L   Potassium 4.1 3.5 - 5.1 mmol/L   Chloride 104 98 - 111 mmol/L   CO2 24 22 - 32 mmol/L   Glucose, Bld 117 (H) 70 - 99 mg/dL    Comment: Glucose reference range applies only to samples taken after fasting for at least 8 hours.   BUN 33 (H) 8 - 23 mg/dL   Creatinine, Ser 1.88 (H) 0.61 - 1.24 mg/dL   Calcium 7.7 (L) 8.9 -  10.3 mg/dL   Total Protein 5.6 (L) 6.5 - 8.1 g/dL   Albumin 2.5 (L) 3.5 - 5.0 g/dL   AST 59 (H) 15 - 41 U/L   ALT 89 (H) 0 - 44 U/L   Alkaline Phosphatase 82 38 - 126 U/L   Total Bilirubin 1.0 0.3 - 1.2 mg/dL   GFR, Estimated 39 (L) >60 mL/min    Comment: (NOTE) Calculated using the CKD-EPI Creatinine Equation (2021)    Anion gap 6 5 - 15    Comment: Performed at [facility], [address] [place], [place] [address]  CBC     Status: Abnormal   Collection Time: 07/31/21  5:20  AM  Result Value Ref Range   WBC 15.8 (H) 4.0 - 10.5 K/uL   RBC 4.11 (L) 4.22 - 5.81 MIL/uL   Hemoglobin 12.3 (L) 13.0 - 17.0 g/dL   HCT 37.0 (L) 39.0 - 52.0 %   MCV 90.0 80.0 - 100.0 fL   MCH 29.9 26.0 - 34.0 pg   MCHC 33.2 30.0 - 36.0 g/dL   RDW 14.6 11.5 - 15.5 %   Platelets 93 (L) 150 - 400 K/uL    Comment: Immature Platelet Fraction may be clinically indicated, consider ordering this additional test [id number]    nRBC 0.0 0.0 - 0.2 %    Comment: Performed at [facility], [address] [place], [place] [address]  Magnesium     Status: None   Collection Time: 07/31/21  5:20 AM  Result Value Ref Range   Magnesium 2.4 1.7 - 2.4 mg/dL    Comment: Performed at [facility], [address] [place], [place] [address]  Phosphorus     Status: None   Collection Time: 07/31/21  5:20 AM  Result Value Ref Range   Phosphorus 3.3 2.5 - 4.6 mg/dL    Comment: Performed at [facility], [address] [place], [place] [address]  Comprehensive metabolic panel     Status: Abnormal   Collection Time: 08/01/21  5:00 AM  Result Value Ref Range   Sodium 131 (L) 135 - 145 mmol/L   Potassium 4.0 3.5 - 5.1 mmol/L   Chloride 101 98 - 111 mmol/L   CO2 25 22 - 32 mmol/L   Glucose, Bld 112 (H) 70 - 99 mg/dL    Comment: Glucose reference range applies only to samples taken after fasting for at least 8 hours.   BUN 24 (H) 8 - 23 mg/dL   Creatinine, Ser 1.26 (H) 0.61 - 1.24 mg/dL   Calcium 8.1 (L) 8.9 - 10.3 mg/dL   Total Protein 5.6 (L) 6.5 - 8.1 g/dL   Albumin 2.3 (L) 3.5 - 5.0 g/dL   AST 21 15 - 41 U/L   ALT 41 0 - 44 U/L   Alkaline Phosphatase 69 38 - 126 U/L   Total Bilirubin 0.7 0.3 - 1.2 mg/dL   GFR, Estimated >60 >60 mL/min    Comment: (NOTE) Calculated using the CKD-EPI Creatinine Equation (2021)    Anion gap 5 5 - 15    Comment: Performed at [facility], [address] [place], [place] [address]  CBC     Status: Abnormal   Collection Time:  08/01/21  5:00 AM  Result Value Ref Range   WBC 12.6 (H) 4.0 - 10.5 K/uL   RBC 4.00 (L) 4.22 - 5.81 MIL/uL   Hemoglobin 11.9 (L) 13.0 - 17.0 g/dL   HCT 36.7 (L) 39.0 - 52.0 %  MCV 91.8 80.0 - 100.0 fL   MCH 29.8 26.0 - 34.0 pg   MCHC 32.4 30.0 - 36.0 g/dL   RDW 14.6 11.5 - 15.5 %   Platelets 76 (L) 150 - 400 K/uL    Comment: Immature Platelet Fraction may be clinically indicated, consider ordering this additional test [id number] REPEATED TO VERIFY    nRBC 0.0 0.0 - 0.2 %    Comment: Performed at [facility], [address] [place], [place] [address]  Comprehensive metabolic panel     Status: Abnormal   Collection Time: 08/02/21  6:13 AM  Result Value Ref Range   Sodium 133 (L) 135 - 145 mmol/L   Potassium 3.7 3.5 - 5.1 mmol/L   Chloride 100 98 - 111 mmol/L   CO2 26 22 - 32 mmol/L   Glucose, Bld 74 70 - 99 mg/dL    Comment: Glucose reference range applies only to samples taken after fasting for at least 8 hours.   BUN 24 (H) 8 - 23 mg/dL   Creatinine, Ser 1.21 0.61 - 1.24 mg/dL   Calcium 8.1 (L) 8.9 - 10.3 mg/dL   Total Protein 5.3 (L) 6.5 - 8.1 g/dL   Albumin 2.3 (L) 3.5 - 5.0 g/dL   AST 21 15 - 41 U/L   ALT 27 0 - 44 U/L   Alkaline Phosphatase 75 38 - 126 U/L   Total Bilirubin 0.8 0.3 - 1.2 mg/dL   GFR, Estimated >60 >60 mL/min    Comment: (NOTE) Calculated using the CKD-EPI Creatinine Equation (2021)    Anion gap 7 5 - 15    Comment: Performed at [facility], [address] [place], [place] [address]  CBC     Status: Abnormal   Collection Time: 08/02/21  6:13 AM  Result Value Ref Range   WBC 10.2 4.0 - 10.5 K/uL   RBC 3.95 (L) 4.22 - 5.81 MIL/uL   Hemoglobin 11.4 (L) 13.0 - 17.0 g/dL   HCT 35.8 (L) 39.0 - 52.0 %   MCV 90.6 80.0 - 100.0 fL   MCH 28.9 26.0 - 34.0 pg   MCHC 31.8 30.0 - 36.0 g/dL   RDW 14.2 11.5 - 15.5 %   Platelets 81 (L) 150 - 400 K/uL    Comment: Immature Platelet Fraction may be clinically indicated, consider ordering  this additional test [id number]    nRBC 0.0 0.0 - 0.2 %    Comment: Performed at [facility], [address] [place], [place] [address]  CULTURE, BLOOD (ROUTINE X 2) w Reflex to ID Panel     Status: None   Collection Time: 08/02/21  4:33 PM   Specimen: BLOOD  Result Value Ref Range   Specimen Description BLOOD BLOOD RIGHT HAND    Special Requests      BOTTLES DRAWN AEROBIC AND ANAEROBIC Blood Culture adequate volume   Culture      NO GROWTH 5 DAYS Performed at [facility], [address] [place], [place] [address]    Report Status 08/07/2021 FINAL   Culture, blood (Routine X 2) w Reflex to ID Panel     Status: None   Collection Time: 08/03/21  5:01 AM   Specimen: BLOOD RIGHT FOREARM  Result Value Ref Range   Specimen Description BLOOD RIGHT FOREARM    Special Requests      BOTTLES DRAWN AEROBIC AND ANAEROBIC Blood Culture adequate volume   Culture      NO GROWTH 5 DAYS Performed  at [facility], [address] [place], [place] [address]    Report Status 08/08/2021 FINAL   CBC     Status: Abnormal   Collection Time: 08/04/21  4:43 AM  Result Value Ref Range   WBC 7.4 4.0 - 10.5 K/uL   RBC 3.56 (L) 4.22 - 5.81 MIL/uL   Hemoglobin 10.8 (L) 13.0 - 17.0 g/dL   HCT 31.7 (L) 39.0 - 52.0 %   MCV 89.0 80.0 - 100.0 fL   MCH 30.3 26.0 - 34.0 pg   MCHC 34.1 30.0 - 36.0 g/dL   RDW 14.0 11.5 - 15.5 %   Platelets 136 (L) 150 - 400 K/uL    Comment: Immature Platelet Fraction may be clinically indicated, consider ordering this additional test [id number]    nRBC 0.0 0.0 - 0.2 %    Comment: Performed at [facility], [address] [place], [place] [address]  Basic metabolic panel     Status: Abnormal   Collection Time: 08/04/21  4:43 AM  Result Value Ref Range   Sodium 133 (L) 135 - 145 mmol/L   Potassium 4.0 3.5 - 5.1 mmol/L   Chloride 99 98 - 111 mmol/L   CO2 26 22 - 32 mmol/L   Glucose, Bld 93 70 - 99 mg/dL    Comment: Glucose  reference range applies only to samples taken after fasting for at least 8 hours.   BUN 17 8 - 23 mg/dL   Creatinine, Ser 0.99 0.61 - 1.24 mg/dL   Calcium 8.1 (L) 8.9 - 10.3 mg/dL   GFR, Estimated >60 >60 mL/min    Comment: (NOTE) Calculated using the CKD-EPI Creatinine Equation (2021)    Anion gap 8 5 - 15    Comment: Performed at [facility], [address] [place], [place] [address]  Basic metabolic panel     Status: Abnormal   Collection Time: 08/06/21  6:46 AM  Result Value Ref Range   Sodium 134 (L) 135 - 145 mmol/L   Potassium 4.0 3.5 - 5.1 mmol/L   Chloride 97 (L) 98 - 111 mmol/L   CO2 30 22 - 32 mmol/L   Glucose, Bld 106 (H) 70 - 99 mg/dL    Comment: Glucose reference range applies only to samples taken after fasting for at least 8 hours.   BUN 20 8 - 23 mg/dL   Creatinine, Ser 1.30 (H) 0.61 - 1.24 mg/dL   Calcium 8.2 (L) 8.9 - 10.3 mg/dL   GFR, Estimated >60 >60 mL/min    Comment: (NOTE) Calculated using the CKD-EPI Creatinine Equation (2021)    Anion gap 7 5 - 15    Comment: Performed at [facility], [address] [place], [place] [address]  CBC     Status: Abnormal   Collection Time: 08/06/21  6:46 AM  Result Value Ref Range   WBC 7.3 4.0 - 10.5 K/uL   RBC 3.88 (L) 4.22 - 5.81 MIL/uL   Hemoglobin 11.4 (L) 13.0 - 17.0 g/dL   HCT 34.2 (L) 39.0 - 52.0 %   MCV 88.1 80.0 - 100.0 fL   MCH 29.4 26.0 - 34.0 pg   MCHC 33.3 30.0 - 36.0 g/dL   RDW 14.0 11.5 - 15.5 %   Platelets 194 150 - 400 K/uL   nRBC 0.0 0.0 - 0.2 %    Comment: Performed at [facility], [address], [place], [place] [address]  Comprehensive metabolic panel     Status: Abnormal  Collection Time: 08/07/21  6:26 AM  Result Value Ref Range   Sodium 132 (L) 135 - 145 mmol/L   Potassium 4.1 3.5 - 5.1 mmol/L   Chloride 98 98 - 111 mmol/L   CO2 28 22 - 32 mmol/L   Glucose, Bld 102 (H) 70 - 99 mg/dL    Comment: Glucose reference range applies only to samples taken  after fasting for at least 8 hours.   BUN 18 8 - 23 mg/dL   Creatinine, Ser 1.08 0.61 - 1.24 mg/dL   Calcium 8.2 (L) 8.9 - 10.3 mg/dL   Total Protein 6.2 (L) 6.5 - 8.1 g/dL   Albumin 2.5 (L) 3.5 - 5.0 g/dL   AST 34 15 - 41 U/L   ALT 33 0 - 44 U/L   Alkaline Phosphatase 83 38 - 126 U/L   Total Bilirubin 0.6 0.3 - 1.2 mg/dL   GFR, Estimated >60 >60 mL/min    Comment: (NOTE) Calculated using the CKD-EPI Creatinine Equation (2021)    Anion gap 6 5 - 15    Comment: Performed at [facility], [address] [place], [place] [address]   Objective  Body mass index is 29.24 kg/m. Wt Readings from Last 3 Encounters:  08/17/21 198 lb (89.8 kg)  08/14/21 198 lb (89.8 kg)  08/07/21 198 lb 10.2 oz (90.1 kg)   Temp Readings from Last 3 Encounters:  08/17/21 98 F (36.7 C) (Oral)  08/07/21 97.6 F (36.4 C) (Oral)  06/26/21 (!) 97.2 F (36.2 C) (Temporal)   BP Readings from Last 3 Encounters:  08/17/21 126/68  08/14/21 125/75  08/07/21 (!) 150/78   Pulse Readings from Last 3 Encounters:  08/17/21 64  08/14/21 78  08/07/21 64    Physical Exam Vitals and nursing note reviewed.  Constitutional:      Appearance: Normal appearance. He is well-developed and well-groomed. He is obese.  HENT:     Head: Normocephalic and atraumatic.  Eyes:     Conjunctiva/sclera: Conjunctivae normal.     Pupils: Pupils are equal, round, and reactive to light.  Cardiovascular:     Rate and Rhythm: Normal rate and regular rhythm.     Heart sounds: Normal heart sounds.  Pulmonary:     Effort: Pulmonary effort is normal. No respiratory distress.     Breath sounds: Normal breath sounds.  Abdominal:     Tenderness: There is no abdominal tenderness.  Musculoskeletal:     Lumbar back: Tenderness present. Negative right straight leg raise test and negative left straight leg raise test.  Skin:    General: Skin is warm and moist.  Neurological:     General: No focal deficit present.      Mental Status: He is alert and oriented to person, place, and time. Mental status is at baseline.     Sensory: Sensation is intact.     Motor: Motor function is intact.     Coordination: Coordination is intact.     Gait: Gait is intact. Gait normal.  Psychiatric:        Attention and Perception: Attention and perception normal.        Mood and Affect: Mood and affect normal.        Speech: Speech normal.        Behavior: Behavior normal. Behavior is cooperative.        Thought Content: Thought content normal.        Cognition and Memory: Cognition and memory normal.  Judgment: Judgment normal.    Assessment  Plan  Open wound - Plan: mupirocin ointment (BACTROBAN) 2 %, oxyCODONE-acetaminophen (PERCOCET/ROXICET) 5-325 MG tablet Silvadine  Wound care  Skin graft sch 08/24/21 Dr Barbette Merino   Chronic combined systolic and diastolic CHF (congestive heart failure) (HCC)  medications changed due hypotension d/c lasix 40 and spironolactone 25 and entresto changed 97-103 to 24-26 1/2 pill on toresemide 20 mg qd  F/u with chf and cardiology if sxs wt gain sob, ab bloating, leg edema may need to take 1 pill entresto 24-26 mg bid and increase dose pt was on the max dose   Right mid back mass  ?CT scan or [place] right mid back lesion ? Lipoma vs muscle spasm  Pt to call back and let me know    HM HM -fasting labs Flu shot utd  pna 23 utd prevnar due in future covid vaccine 2/2 2nd 01/12/20, 06/02/20 pfizer consider booster   Tdap utd Consider shingrix in the future    hcv 06/29/97 negative   Skin no issues   Colonoscopy [facility]  -5/22 and 6/22 egd colonoscopy   PSA sees Dr. Vernice Jefferson urology appt sch 05/2020 06/09/20 0.35 -referred [facility] u[facility] Dr. Diamantina Providence f/u 06/2021    Smoker CT chest 01/19/19 COPD still smoking rec cessation   smoking <1 ppd some times <1/2 ppd or 5-6 cig qd since age 28 y.o smoker and quit x 5 year FH lung cancer in mother trying to get help with  chantix  As of 12/28/19 quit smoking x 5 weeks congratulated    02/16/20 CT chest  IMPRESSION: 1. Lung-RADS 2, benign appearance or behavior. Continue annual screening with low-dose chest CT without contrast in 12 months. 2. Aortic atherosclerosis (ICD10-I70.0). Coronary artery calcification. 3.  Emphysema (ICD10-J43.9   06/29/2019 [facility] cerumen impaction hearing loss with hearing aids improved after wax removal      Cards Dr. Rockey Situ and EP Dr. Jolyn Nap , CHF clinic    Provider: Dr. Olivia Mackie [facility]-Internal Medicine

## 2021-08-17 NOTE — Patient Instructions (Addendum)
Miralax and or colace (stool softner) for constipation   Dove antibacterial body wash or dial antibacterial body wash   If wound not better recommend wound clinic   Premier protein shakes 2x per day  30 grams protein 1 gram sugar   Multivitamin centrum for men or nature made multivitamin

## 2021-08-20 ENCOUNTER — Ambulatory Visit (INDEPENDENT_AMBULATORY_CARE_PROVIDER_SITE_OTHER): Payer: Medicare Other | Admitting: Pharmacist

## 2021-08-20 ENCOUNTER — Encounter: Payer: Self-pay | Admitting: Internal Medicine

## 2021-08-20 ENCOUNTER — Telehealth: Payer: Self-pay | Admitting: *Deleted

## 2021-08-20 ENCOUNTER — Other Ambulatory Visit: Payer: Self-pay | Admitting: Cardiovascular Disease

## 2021-08-20 ENCOUNTER — Encounter: Payer: Self-pay | Admitting: Surgery

## 2021-08-20 ENCOUNTER — Encounter
Admission: RE | Admit: 2021-08-20 | Discharge: 2021-08-20 | Disposition: A | Discharge status: Home / Self Care | Payer: Medicare Other | Source: Ambulatory Visit | Attending: Surgery | Admitting: Surgery

## 2021-08-20 ENCOUNTER — Other Ambulatory Visit: Payer: Self-pay

## 2021-08-20 ENCOUNTER — Telehealth: Payer: Self-pay | Admitting: Pharmacist

## 2021-08-20 DIAGNOSIS — J449 Chronic obstructive pulmonary disease, unspecified: Secondary | ICD-10-CM

## 2021-08-20 DIAGNOSIS — I5042 Chronic combined systolic (congestive) and diastolic (congestive) heart failure: Secondary | ICD-10-CM

## 2021-08-20 DIAGNOSIS — I25118 Atherosclerotic heart disease of native coronary artery with other forms of angina pectoris: Secondary | ICD-10-CM

## 2021-08-20 DIAGNOSIS — E785 Hyperlipidemia, unspecified: Secondary | ICD-10-CM

## 2021-08-20 HISTORY — DX: Chronic obstructive pulmonary disease, unspecified: J44.9

## 2021-08-20 HISTORY — DX: Anxiety disorder, unspecified: F41.9

## 2021-08-20 HISTORY — DX: Heart failure, unspecified: I50.9

## 2021-08-20 MED ORDER — SACUBITRIL-VALSARTAN 24-26 MG PO TABS: 1.0000 | ORAL_TABLET | Freq: Two times a day (BID) | ORAL | 3 refills | Status: DC

## 2021-08-20 NOTE — Patient Instructions (Signed)
Visit Information  Following are the goals we discussed today:  Patient Goals/Self-Care Activities Over the next 90 days, patient will:  - take medications as prescribed focus on medication adherence by collaboration to investigate insurance  weigh daily, and contact provider if weight gain of >3 lbs/day or >5 lbs/week collaborate with provider on medication access solutions        Plan: Telephone follow up appointment with care management team member scheduled for:  8 weeks   Catie Darnelle Maffucci, PharmD, [place], CPP Clinical Pharmacist [place] [facility] [phone]   Please call the care guide team at [phone] if you need to cancel or reschedule your appointment.   The patient verbalized understanding of instructions, educational materials, and care plan provided today and declined offer to receive copy of patient instructions, educational materials, and care plan.

## 2021-08-20 NOTE — Patient Instructions (Signed)
Your procedure is scheduled on:08-24-21 Friday Report to the Registration Desk on the 1st floor of the [facility].Then proceed to the 2nd floor Surgery Desk in the [facility] To find out your arrival time, please call [phone] between 1PM - 3PM on:08-23-21 Thursday  REMEMBER: Instructions that are not followed completely may result in serious medical risk, up to and including death; or upon the discretion of your surgeon and anesthesiologist your surgery may need to be rescheduled.  Do not eat food after midnight the night before surgery.  No gum chewing, lozengers or hard candies.  You may however, drink CLEAR liquids up to 2 hours before you are scheduled to arrive for your surgery. Do not drink anything within 2 hours of your scheduled arrival time.  Clear liquids include: - water  - apple juice without pulp - gatorade (not RED, PURPLE, OR BLUE) - black coffee or tea (Do NOT add milk or creamers to the coffee or tea) Do NOT drink anything that is not on this list.  TAKE THESE MEDICATIONS THE MORNING OF SURGERY WITH A SIP OF WATER: -amiodarone (PACERONE)  -carvedilol (COREG) -You may take your ALPRAZolam Duanne Moron) the day of surgery if needed for anxiety  Use your tiotropium (SPIRIVA)and  ipratropium-albuterol (DUONEB) the morning of surgery and bring your Albuterol Inhaler to the hospital  clopidogrel (PLAVIX) and Aspirin were stopped as instructed by Dr Ines Bloomer office-Last dose was on 08-17-21 Friday  One week prior to surgery: Stop Anti-inflammatories (NSAIDS) such as Advil, Aleve, Ibuprofen, Motrin, Naproxen, Naprosyn and Aspirin based products such as Excedrin, Goodys Powder, BC Powder. You may however, take Tylenol/Percocet if needed for pain up until the day of surgery.  No Alcohol for 24 hours before or after surgery.  No Smoking including e-cigarettes for 24 hours prior to surgery.  No chewable tobacco products for at least 6 hours prior to surgery.  No nicotine  patches on the day of surgery.  Do not use any "recreational" drugs for at least a week prior to your surgery.  Please be advised that the combination of cocaine and anesthesia may have negative outcomes, up to and including death. If you test positive for cocaine, your surgery will be cancelled.  On the morning of surgery brush your teeth with toothpaste and water, you may rinse your mouth with mouthwash if you wish. Do not swallow any toothpaste or mouthwash.  Use CHG wipes as directed on instruction sheet.  Do not wear jewelry, make-up, hairpins, clips or nail polish.  Do not wear lotions, powders, or perfumes.   Do not shave body from the neck down 48 hours prior to surgery just in case you cut yourself which could leave a site for infection.  Also, freshly shaved skin may become irritated if using the CHG soap.  Contact lenses, hearing aids and dentures may not be worn into surgery.  Do not bring valuables to the hospital. [facility] is not responsible for any missing/lost belongings or valuables.   Notify your doctor if there is any change in your medical condition (cold, fever, infection).  Wear comfortable clothing (specific to your surgery type) to the hospital.  After surgery, you can help prevent lung complications by doing breathing exercises.  Take deep breaths and cough every 1-2 hours. Your doctor may order a device called an Incentive Spirometer to help you take deep breaths. When coughing or sneezing, hold a pillow firmly against your incision with both hands. This is called splinting. Doing this helps  protect your incision. It also decreases belly discomfort.  If you are being admitted to the hospital overnight, leave your suitcase in the car. After surgery it may be brought to your room.  If you are being discharged the day of surgery, you will not be allowed to drive home. You will need a responsible adult (18 years or older) to drive you home and stay with  you that night.   If you are taking public transportation, you will need to have a responsible adult (18 years or older) with you. Please confirm with your physician that it is acceptable to use public transportation.   Please call the [facility] at [phone] if you have any questions about these instructions.  Surgery Visitation Policy:  Patients undergoing a surgery or procedure may have one family member or support person with them as long as that person is not COVID-19 positive or experiencing its symptoms.  That person may remain in the waiting area during the procedure and may rotate out with other people.  Inpatient Visitation:    Visiting hours are 7 a.m. to 8 p.m. Up to two visitors ages 16+ are allowed at one time in a patient room. The visitors may rotate out with other people during the day. Visitors must check out when they leave, or other visitors will not be allowed. One designated support person may remain overnight. The visitor must pass COVID-19 screenings, use hand sanitizer when entering and exiting the patients room and wear a mask at all times, including in the patients room. Patients must also wear a mask when staff or their visitor are in the room. Masking is required regardless of vaccination status.

## 2021-08-20 NOTE — Progress Notes (Signed)
[facility] DEVICE PROGRAMMING  Patient Information: Name:  OVIDIO STEELE  DOB:  12-17-1955  MRN:  [id number]    Planned Procedure: SKIN GRAFT APPLICATION  Surgeon:  Dr. Benjamine Sprague, MD  Requesting device clearance: Honor Loh, FNP-C  Date of Procedure:  08/24/2021  Cautery will be used.   Please route documentation back me via [facility], or may fax report to [facility] PAT APP at [phone]. Device Information:  Clinic EP Physician:  Virl Axe, MD   Device Type:  Defibrillator Manufacturer and Phone #:  [employer]: [phone] Pacemaker Dependent?:  No. Date of Last Device Check:  03/03/2021 Normal Device Function?:  Yes.    Electrophysiologist's Recommendations:  Have magnet available. Provide continuous ECG monitoring when magnet is used or reprogramming is to be performed.  Procedure will likely interfere with device function.  Device should be programmed:  Tachy therapies disabled  Per Device Clinic Standing Orders, Wanda Plump, RN  5:18 PM 08/20/2021

## 2021-08-20 NOTE — Progress Notes (Signed)
Perioperative Services  Pre-Admission/Anesthesia Testing Clinical Review  Date: 08/22/21  Patient Demographics:  Name: Ian Mills DOB:   10-03-1955 MRN:   [id number]  Planned Surgical Procedure(s):    Case: [id number] Date/Time: 08/24/21 0855   Procedure: GRAFT APPLICATION (Left: Arm Upper)   Anesthesia type: General   Pre-op diagnosis: ARM WOUND LEFT SEQUELA   Location: [place] [facility] / [place] [facility]   Surgeons: Benjamine Sprague, DO   NOTE: Available PAT nursing documentation and vital signs have been reviewed. Clinical nursing staff has updated patient's PMH/PSHx, current medication list, and drug allergies/intolerances to ensure comprehensive history available to assist in medical decision making as it pertains to the aforementioned surgical procedure and anticipated anesthetic course. Extensive review of available clinical information performed. Ian Mills PMH and PSHx updated with any diagnoses/procedures that  may have been inadvertently omitted during his intake with the pre-admission testing department's nursing staff.  Clinical Discussion:  Ian Mills is a 66 y.o. male who is submitted for pre-surgical anesthesia review and clearance prior to him undergoing the above procedure. Patient is a Former Smoker (21 pack years; quit 10/2019). Pertinent PMH includes: CAD, MI x 3, NSVT, HFrEF, ischemic cardiomyopathy, aortic atherosclerosis, HTN, HLD, COPD, anxiety (on BZO).   Patient is followed by cardiology Rockey Situ, MD). He was last seen in the cardiology clinic on 05/02/2021; notes reviewed.  At the time of his clinic visit, patient doing well overall from a cardiovascular perspective.  He denied any episodes of chest pain, shortness of breath, PND, orthopnea, palpitations, significant peripheral edema, vertiginous symptoms, or presyncope/syncope.  Ian Mills has a significant cardiovascular history.  Patient suffered an anterior wall MI on 07/04/1991.  LVEF at that  time was 29%.  Catheterization revealed 100% occlusion of the mid LAD.  PTCA was performed resulting in <25% residual stenosis.  Patient suffered a lateral wall MI on 06/29/1997.  Diagnostic left heart catheterization was performed revealing an LVEF of 44%.  There was multivessel CAD; 75% RPDA and 95% OM3.  PCI was performed placing a 3.0 x 18 mm Duet stent to the OM3.  Repeat diagnostic left heart catheterization was performed on 07/12/1998.  LVEF depressed at 36%.  Patient with multivessel CAD; 95% D1 and 75% RPDA.  Subsequent PCI was performed placing a 2.5 x 8 mm Duet stent to D1 as well as a 2.5 x 13 mm Duet stent to the RPDA resulting in <25% residual stenosis and TIMI-3 flow.  Patient suffered an NSTEMI on 11/27/2011.  Diagnostic left heart catheterization revealed a high percent occlusion of the mid LAD.  Intervention was deferred opting for medical management.  Patient suffered an episode of ventricular tachycardia arrest on 12/01/2011.  ACLS measures taken and ROSC achieved.  Patient subsequently underwent placement of a Gail. Additionally, he is currently on antiarrythmic (amiodarone) + beta blocker (carvedilol) therapies.   Myocardial perfusion imaging study performed on 08/10/2019 revealed a severely decreased left ventricular systolic function with an EF of 30%.  The mid segment and apex of the left ventricle was akinetic.  There were multiple perfusion abnormalities consistent with scar and peri-infarct ischemia.  Study determined to be high risk.  Last diagnostic left heart catheterization was performed on 08/30/2019 demonstrating severe multivessel CAD; 20% proximal mid RCA, 30% proximal RCA, 20% proximal and mid LAD, 60% D1, 60% ostial to proximal LCx (progressed), and 40% mid LCx.  There was in-stent restenosis of the previously placed RCA stent (mild) and LCx stent (  moderate).  Last TTE was performed on 07/12/2021 revealing a significantly reduced left ventricular  systolic function with an EF of 25-30%.  Diastolic parameters consistent with impaired relaxation (G1DD).  There was akinesis of the left ventricular apex.  There was no evidence of significant valvular regurgitation or a transvalvular gradient to suggest stenosis.  Given patient's significant CAD and multiple stent placement, he remains on daily DAPT therapy (ASA + clopidogrel).  Patient reported to be compliant with no evidence or reports of GI bleeding.  Patient is on a statin + ezetimibe for his HLD and further ASCVD prevention. He is not diabetic.  Patient remains active and enjoys activities such as fishing and kayaking.  Blood pressure and HFrEF symptoms managed using beta-blocker, ARB/ANRi, and diuretic therapies; last blood pressure documented at 118/60. Despite significant cardiovascular disease, patient still felt to be able to achieve at least 4 METS of activity without angina/anginal equivalent symptoms.  No changes were made to his medication regimen.  Patient to follow-up with outpatient cardiology in 6 months or sooner if needed.  Ian Mills recently admitted for significant cellulitis to his LEFT upper extremity.  Following resolution of the cellulitis, patient will require SKIN GRAFTING procedure to this area for both cosmesis and continued adequate wound healing.  At the time of his consultation with general surgery, area of concern measured 10.0 x 4.5 cm.  Procedure is scheduled to take place on 08/24/2021 with Dr. Benjamine Sprague, MD With that being said, given patient's past medical history significant for cardiovascular diagnoses, presurgical cardiac clearance was sought by the PAT team. Per cardiology, "based ACC/AHA guidelines, the patient's past medical history, and the amount of time since his last clinic visit, this patient would be at an overall ACCEPTABLE risk for the planned procedure without further cardiovascular testing or intervention at this time". Again, this patient is on  daily DAPT therapy.  He has been instructed on recommendations from his primary cardiologist for holding his clopidogrel for 5 days prior to his procedure with plans to restart as soon as postoperative bleeding risk felt to be minimized by his primary attending surgeon.  The patient is aware that his last dose of clopidogrel will be on 08/18/2021.  Of note, patient has been instructed to continue his daily low-dose ASA throughout the perioperative period.    Patient denies previous perioperative complications with anesthesia in the past. In review of the available records, it is noted that patient underwent a general anesthetic course here (ASA IV) in 11/2020 without documented complications.   Vitals with BMI 08/17/2021 08/14/2021 08/07/2021  Height _0  _1  -  Weight 198 lbs 198 lbs -  BMI 96.29 52.84 -  Systolic 132 440 102  Diastolic 68 75 78  Pulse 64 78 64    Providers/Specialists:   NOTE: Primary physician provider listed below. Patient may have been seen by APP or partner within same practice.   PROVIDER ROLE / SPECIALTY LAST Shirline Frees, DO General Surgery 08/14/2021  McLean-Scocuzza, Nino Glow, MD Primary Care Provider 08/17/2021  Ida Rogue, MD Cardiology 05/02/2021  Tsosie Billing, MD Infectious Disease 08/14/2021   Allergies:  Nitroglycerin  Current Home Medications:   No current facility-administered medications for this encounter.    albuterol (VENTOLIN HFA) 108 (90 Base) MCG/ACT inhaler   ALPRAZolam (XANAX) 0.5 MG tablet   amiodarone (PACERONE) 200 MG tablet   aspirin 81 MG EC tablet   atorvastatin (LIPITOR) 80 MG tablet   carvedilol (COREG) 3.125 MG  tablet   clopidogrel (PLAVIX) 75 MG tablet   ezetimibe (ZETIA) 10 MG tablet   ipratropium-albuterol (DUONEB) 0.5-2.5 (3) MG/3ML SOLN   polyethylene glycol (MIRALAX / GLYCOLAX) 17 g packet   potassium chloride (KLOR-CON 10) 10 MEQ tablet   tadalafil (CIALIS) 10 MG tablet   torsemide (DEMADEX) 20 MG  tablet   amoxicillin (AMOXIL) 500 MG capsule   mupirocin ointment (BACTROBAN) 2 %   oxyCODONE-acetaminophen (PERCOCET/ROXICET) 5-325 MG tablet   sacubitril-valsartan (ENTRESTO) 24-26 MG   silver sulfADIAZINE (SILVADENE) 1 % cream   Tiotropium Bromide-Olodaterol (STIOLTO RESPIMAT) 2.5-2.5 MCG/ACT AERS   History:   Past Medical History:  Diagnosis Date   Acute anterior wall MI ([place]) 07/04/1991   a.) LHC: EF 29%; 100% mLAD -> PTCA performed   Anxiety    Aortic atherosclerosis (HCC)    COPD (chronic obstructive pulmonary disease) (HCC)    Coronary artery disease    a.) 3 stents --> 3x18 mm Duet to OM3, 2.5x18 mm Duet to D1, 2.5x13 mm Duet to RPDA. b. 06/2013 PCI: CTO LAD, patent LAD stent, RCA 70p (FFR 0.74--> DES), 74m(DES), RPDA 50%, EF 20%; c. 08/2019 Cath: LM nl, LAD 100p/m, D1 60, D2 patent stent, LCX 60ost/p, 450mSR, RCA 30p, 2060mR.   Family history of lung cancer    HFrEF (heart failure with reduced ejection fraction) ([place]  a. 05/2014 Echo: EF 25-30%; b. 01/2018 Echo ([facility]): EF 30%; c. 06/2021 Echo: EF 25-30%, apical AK, GrI DD, nl RV fxn.   HLD (hyperlipidemia)    HTN (hypertension)    ICD  single,BSX    a. DOI 11/2011; b. S/N# [id number] cardiomyopathy    a. 05/2014 Echo: EF 25-30%; b. 01/2018 Echo ([facility]): EF 30%, c. 06/2021 Echo: EF 25-30%.   Lateral wall myocardial infarction ([facility]   a.) LHC: EF 44%; 75% RPDA and 95% OM3; PCI performed and a 3.0 x 18 mm Duet stent placed to OM3Winthroprm current use of antithrombotics/antiplatelets    a.) DAPT therapy (ASA + clopidogrel)   NSTEMI (non-ST elevated myocardial infarction) [place]   a.) LHC: 100% mLAD; intervention deferred opting for medical management.   Personal history of colonic polyps    Pneumonia 08/2018   Sepsis ([facility]/07/2021   Syncope and collapse    Ventricular tachycardia 12/01/2011   a.) VT arrest; ROSC achieved --> BOttawaaced.   Past Surgical History:   Procedure Laterality Date   APPENDECTOMY N/A    CARDIAC DEFIBRILLATOR PLACEMENT N/A 12/01/2011   COLONOSCOPY WITH PROPOFOL N/A 12/22/2020   Procedure: COLONOSCOPY WITH PROPOFOL;  Surgeon: AnnJonathon BellowsD;  [facility];  Service: Gastroenterology;  Laterality: N/A;   CORONARY ANGIOPLASTY Left 07/05/1991   Procedure: CORONARY ANGIOPLASTY; Location: [facility]; Surgeon: HarDoristine BosworthD   CORONARY ANGIOPLASTY Left 08/06/1991   Procedure: CORONARY ANGIOPLASTY; Location: [facility]; Surgeon: VicEllouise NewerD   CORONARY ANGIOPLASTY Left 08/09/1991   Procedure: CORONARY ANGIOPLASTY; Location: [facility]; Surgeon: VicEllouise NewerD   CORONARY ANGIOPLASTY Left 10/28/1991   Procedure: CORONARY ANGIOPLASTY; Location: [facility]; Surgeon: VicEllouise NewerD   CORONARY ANGIOPLASTY Left 11/02/1991   Procedure: CORONARY ANGIOPLASTY; Location: [facility]; Surgeon: VicEllouise NewerD   CORONARY ANGIOPLASTY Left 01/10/1992   Procedure: CORONARY ANGIOPLASTY; Location: [facility]; Surgeon: RicCarles ColletD   CORONARY ANGIOPLASTY Left 09/26/1993   Procedure: CORONARY ANGIOPLASTY; Location: [facility]; Surgeon: RicMelton AlarD   CORONARY ANGIOPLASTY Left 09/11/2006   Procedure: CORONARY ANGIOPLASTY; Location: [facility]  CORONARY ANGIOPLASTY Left 01/17/2010   Procedure: CORONARY ANGIOPLASTY; Location: [facility]; Surgeon: Wendee Beavers, MD   CORONARY ANGIOPLASTY Left 11/27/2011   Procedure: CORONARY ANGIOPLASTY; Location: [facility]; Surgeon: Derinda Sis, MD   CORONARY ANGIOPLASTY WITH STENT PLACEMENT Left 06/29/1997   Procedure: CORONARY ANGIOPLASTY WITH STENT PLACEMENT (3.0 x 18 mm Diet stent to OM3); Location: [facility]; Surgeon: Allie Bossier, MD   CORONARY ANGIOPLASTY WITH STENT PLACEMENT Left 07/12/1998   Procedure: CORONARY ANGIOPLASTY WITH STENT PLACEMENT (2.8 x 8 mm Duet stent to D1, 2.5 x 13 mm Duet stent to RPDA); Location: [facility]   LEFT HEART CATH AND CORONARY ANGIOGRAPHY Left 08/30/2019   Procedure: LEFT HEART CATH AND CORONARY ANGIOGRAPHY;   Surgeon: Wellington Hampshire, MD;  Location: [facility];  Service: Cardiovascular;  Laterality: Left;   PROSTATE SURGERY     urethra grew around prostate    TUMOR REMOVAL  2001   chest thymic cyst; behind lungs [facility] benign    Family History  Problem Relation Age of Onset   Cancer - Lung Mother    Hypertension Mother    Heart attack Mother    Cancer Father        larynx   Dementia Maternal Grandmother    Heart attack Paternal Grandfather    Colon cancer Neg Hx    Stomach cancer Neg Hx    Pancreatic cancer Neg Hx    Social History   Tobacco Use   Smoking status: Former    Packs/day: 0.50    Years: 42.00    Pack years: 21.00    Types: Cigarettes    Quit date: 11/10/2019    Years since quitting: 1.7   Smokeless tobacco: Never  Vaping Use   Vaping Use: Never used  Substance Use Topics   Alcohol use: Yes    Alcohol/week: 8.0 standard drinks    Types: 8 Cans of beer per week    Comment: per week   Drug use: No    Pertinent Clinical Results:  LABS: Labs reviewed: Acceptable for surgery.  Lab Results  Component Value Date   WBC 7.3 08/06/2021   HGB 11.4 (L) 08/06/2021   HCT 34.2 (L) 08/06/2021   MCV 88.1 08/06/2021   PLT 194 08/06/2021   Lab Results  Component Value Date   NA 132 (L) 08/07/2021   K 4.1 08/07/2021   CO2 28 08/07/2021   GLUCOSE 102 (H) 08/07/2021   BUN 18 08/07/2021   CREATININE 1.08 08/07/2021   CALCIUM 8.2 (L) 08/07/2021   EGFR 56 (L) 04/06/2021   GFRNONAA >60 08/07/2021    ECG: Date: 07/29/2021 Time ECG obtained: 1845 PM Rate: 80 bpm Rhythm: normal sinus; LAFB Axis (leads I and aVF): Normal Intervals: PR 182 ms. QRS 92 ms. QTc 456 ms. ST segment and T wave changes: No evidence of acute ST segment elevation or depression.  Evidence of an age undetermined and lateral infarct present. Comparison: Similar to previous tracing obtained on 05/02/2021   IMAGING / PROCEDURES: TRANSTHORACIC ECHOCARDIOGRAM performed on 07/12/2021 LV  apex not well visualized. apex appears akinetic. could not evaluate for thrombus presence due to image quality. limited echo with enhancing agent recommended.. Left ventricular ejection fraction, by estimation, is 25 to 30%. The left ventricle has severely decreased function. Left ventricular endocardial border not optimally defined to evaluate regional wall motion. Left ventricular diastolic parameters are consistent with Grade I diastolic dysfunction (impaired relaxation). There is akinesis of the left ventricular, apical segment.  Right ventricular systolic function is  normal. The right ventricular size is normal.  The mitral valve is normal in structure. No evidence of mitral valve regurgitation.  The aortic valve is tricuspid. Aortic valve regurgitation is not visualized.  The inferior vena cava is normal in size with greater than 50% respiratory variability, suggesting right atrial pressure of 3 mmHg.   DIAGNOSTIC RADIOGRAPHS OF CHEST 2 VIEWS performed on 04/04/2021 Stable LEFT chest wall AICD with intact lead in the RIGHT ventricle.  No abandoned leads. Radiographically clear lungs. No pleural effusion or PTX. Unremarkable cardiomediastinal silhouette. Median sternotomy wires and surgical clips noted  LEFT HEART CATHETERIZATION AND CORONARY ANGIOGRAPHY performed on 08/30/2019 Significant underlying CAD 20% proximal to mid RCA stenosis 30% proximal RCA stenosis 100% proximal to mid LAD stenosis 60% D1 stenosis 60% ostial to proximal LCx stenosis 40% mid LCx stenosis ISR of previously placed stents Patient D1 stent Patent RCA stents with mild ISR Patent LCx stent with moderate ISR Left ventricular angiography was not performed due to chronic kidney disease Moderately elevated LVEDP between 25-30 mmHg Recommendations: Continue aggressive medical therapy No indication for revascularization The patient is in volume overload and will stop IV hydration at this time.  Continue  furosemide.   MYOCARDIAL PERFUSION IMAGING STUDY (LEXISCAN) performed on 08/10/2019 Left ventricular ejection fraction is severely decreased at <30% with mid and apical akinesis Large in size, moderate to severe, minimally reversible mid and apical anterior, anteroseptal, and anterolateral defect that is consistent with scar and possible mild peri-infarct ischemia Moderate in size, moderate to severe, mid and apical fixed inferior/inferolateral defect consistent with scar Aortic atherosclerosis is noted, as well as sequela of prior coronary stenting and pacemaker/ICD placement Scattered groundglass and ill-defined nodular opacities in both lungs, which could reflect edema or atypical infection.  Clinical correlation and dedicated chest imaging are recommended Abnormal, high risk pharmacological myocardial perfusion stress imaging test  Impression and Plan:  TAIYO KOZMA has been referred for pre-anesthesia review and clearance prior to him undergoing the planned anesthetic and procedural courses. Available labs, pertinent testing, and imaging results were personally reviewed by me. This patient has been appropriately cleared by cardiology with an overall ACCEPTABLE risk of significant perioperative cardiovascular complications. Completed perioperative prescription for cardiac device management documentation completed by primary cardiology team and placed on patient's chart for review by the surgical/anesthetic team on the day of his procedure.   Based on clinical review performed today (08/22/21), barring any significant acute changes in the patient's overall condition, it is anticipated that he will be able to proceed with the planned surgical intervention. Any acute changes in clinical condition may necessitate his procedure being postponed and/or cancelled. Patient will meet with anesthesia team (MD and/or CRNA) on the day of his procedure for preoperative evaluation/assessment. Questions  regarding anesthetic course will be fielded at that time.   Pre-surgical instructions were reviewed with the patient during his PAT appointment and questions were fielded by PAT clinical staff. Patient was advised that if any questions or concerns arise prior to his procedure then he should return a call to PAT and/or his surgeon's office to discuss.  Honor Loh, MSN, APRN, FNP-C, CEN [facility]  Peri-operative Services Nurse Practitioner Phone: [phone] Fax: [phone] 08/22/21 4:20 PM  NOTE: This note has been prepared using Dragon dictation software. Despite my best ability to proofread, there is always the potential that unintentional transcriptional errors may still occur from this process.

## 2021-08-20 NOTE — Telephone Encounter (Signed)
Patient discharged post hospitalization, Entresto dose reduced.   Patient is enrolled for assistance from [employer] for Entresto through 07/28/22. Updated dose of Entresto needs to be sent to [facility] in [place], who dispenses for [employer] PAP.   Communication sent to Dr. Rockey Situ, Darylene Price, Ignacia Bayley asking that the updated dose be sent to the patient assistance pharmacy.

## 2021-08-20 NOTE — Telephone Encounter (Signed)
Request for pre-operative cardiac clearance Received: Today Karen Kitchens, NP  P Cv Div Preop Callback Request for pre-operative cardiac clearance:     1. What type of surgery is being performed?  SKIN GRAFT APPLICATION   2. When is this surgery scheduled?  08/24/2021     3. Type of clearance being requested (medical, pharmacy, both).  BOTH     4. Are there any medications that need to be held prior to surgery?  ASA + CLOPIDOGREL   5. Practice name and name of physician performing surgery?  Performing surgeon: Dr. Benjamine Sprague, MD  Requesting clearance: Honor Loh, FNP-C       6. Anesthesia type (none, local, MAC, general)?  GENERAL   7. What is the office phone and fax number?    Phone: [phone]  Fax: [phone]   ATTENTION: Unable to create telephone message as per your standard workflow. Directed by [facility] providers to send requests for cardiac clearance to this pool for appropriate distribution to provider covering pre-operative clearances.   Honor Loh, MSN, APRN, FNP-C, CEN  [facility]  Peri-operative Services Nurse Practitioner  Phone: [phone]  08/20/21 11:29 AM

## 2021-08-20 NOTE — Chronic Care Management (AMB) (Signed)
Chronic Care Management CCM Pharmacy Note  08/20/2021 Name:  Ian Mills MRN:  [id number] DOB:  1955-10-01  Summary: - S/p hospitalization. Reviewed medication changes and reasoning for changes. Reviewed upcoming appointments  Recommendations/Changes made from today's visit: - Follow up with HF APP later this week for GDMT titration.   Subjective: Ian Mills is an 66 y.o. year old male who is a primary patient of McLean-Scocuzza, Nino Glow, MD.  The CCM team was consulted for assistance with disease management and care coordination needs.    Engaged with patient by telephone for follow up visit for pharmacy case management and/or care coordination services.   Objective:  Medications Reviewed Today     Reviewed by De Hollingshead, RPH-CPP (Pharmacist) on 08/20/21 at 66  Med List Status: <None>   Medication Order Taking? Sig Documenting Provider Last Dose Status Informant  albuterol (VENTOLIN HFA) 108 (90 Base) MCG/ACT inhaler [id number] Yes Inhale 1 puff into the lungs every 6 (six) hours as needed for wheezing or shortness of breath. [provider] Taking Active Self           Med Note Darnelle Maffucci, Waynette Buttery Aug 16, 2019  9:11 AM)    ALPRAZolam Duanne Moron) 0.5 MG tablet [id number] Yes Take 1 tablet (0.5 mg total) by mouth 3 (three) times daily as needed. for anxiety McLean-Scocuzza, Nino Glow, MD Taking Active Self  amiodarone (PACERONE) 200 MG tablet [id number] Yes Take 1 tablet (200 mg total) by mouth daily. Deboraha Sprang, MD Taking Active Self  amoxicillin (AMOXIL) 500 MG capsule [id number] Yes Take 1 capsule (500 mg total) by mouth 3 (three) times daily. Tsosie Billing, MD Taking Active Self           Med Note Mayo Ao Aug 20, 2021  1:04 PM)    aspirin 81 MG EC tablet [id number] No Take 1 tablet (81 mg total) by mouth daily.  Patient not taking: Reported on 08/20/2021   Minna Merritts, MD Not Taking Active Self  atorvastatin  (LIPITOR) 80 MG tablet [id number] Yes Take 1 tablet (80 mg total) by mouth daily. McLean-Scocuzza, Nino Glow, MD Taking Active Self  carvedilol (COREG) 3.125 MG tablet [id number] Yes Take 1 tablet (3.125 mg total) by mouth 2 (two) times daily. Minna Merritts, MD Taking Active Self  clopidogrel (PLAVIX) 75 MG tablet [id number] No Take 1 tablet (75 mg total) by mouth daily.  Patient not taking: Reported on 08/20/2021   McLean-Scocuzza, Nino Glow, MD Not Taking Active Self  ezetimibe (ZETIA) 10 MG tablet [id number] Yes Take 1 tablet (10 mg total) by mouth daily. McLean-Scocuzza, Nino Glow, MD Taking Active Self  ipratropium-albuterol (DUONEB) 0.5-2.5 (3) MG/3ML SOLN [id number] No Take 3 mLs by nebulization every 6 (six) hours as needed.  Patient not taking: Reported on 08/20/2021   McLean-Scocuzza, Nino Glow, MD Not Taking Active Self  mupirocin ointment (BACTROBAN) 2 % [id number]  Apply 1 application topically 2 (two) times daily. McLean-Scocuzza, Nino Glow, MD  Active   oxyCODONE-acetaminophen (PERCOCET/ROXICET) 5-325 MG tablet [id number] Yes Take 1 tablet by mouth 2 (two) times daily. McLean-Scocuzza, Nino Glow, MD Taking Active   polyethylene glycol (MIRALAX / GLYCOLAX) 17 g packet [id number]  Take 17 g by mouth daily as needed for moderate constipation.  Patient not taking: Reported on 08/17/2021   Loletha Grayer, MD  Active Self  potassium chloride (KLOR-CON 10) 10 MEQ tablet [id number] Yes Take 1 tablet (10 mEq total) by  mouth daily. McLean-Scocuzza, Nino Glow, MD Taking Active Self  sacubitril-valsartan (ENTRESTO) 24-26 MG [id number] Yes Take 1 tablet by mouth 2 (two) times daily. Minna Merritts, MD Taking Active            Med Note Mayo Ao Aug 20, 2021  1:06 PM) Taking 1/2 of old script for 97/103 mg   silver sulfADIAZINE (SILVADENE) 1 % cream [id number]  Apply topically 2 (two) times daily. McLean-Scocuzza, Nino Glow, MD  Active   tadalafil (CIALIS) 10 MG tablet [id number] Yes Take 1-2 tablets  (10-20 mg total) by mouth daily as needed for erectile dysfunction. Billey Co, MD Taking Active Self  Tiotropium Bromide-Olodaterol (STIOLTO RESPIMAT) 2.5-2.5 MCG/ACT AERS [id number] Yes Inhale 2 puffs into the lungs daily. Tyler Pita, MD Taking Active Self  torsemide (DEMADEX) 20 MG tablet [id number] Yes Take 1 tablet (20 mg total) by mouth daily. Loletha Grayer, MD Taking Active Self            Pertinent Labs:   Lab Results  Component Value Date   HGBA1C 5.6 04/18/2021   Lab Results  Component Value Date   CHOL 116 04/18/2021   HDL 45.40 04/18/2021   LDLCALC 52 04/18/2021   TRIG 93.0 04/18/2021   CHOLHDL 3 04/18/2021   Lab Results  Component Value Date   CREATININE 1.08 08/07/2021   BUN 18 08/07/2021   NA 132 (L) 08/07/2021   K 4.1 08/07/2021   CL 98 08/07/2021   CO2 28 08/07/2021    SDOH:  (Social Determinants of Health) assessments and interventions performed:  SDOH Interventions    Flowsheet Row Most Recent Value  SDOH Interventions   Financial Strain Interventions Other (Comment)  [manufacturer assistance]       CCM Care Plan  Review of patient past medical history, allergies, medications, health status, including review of consultants reports, laboratory and other test data, was performed as part of comprehensive evaluation and provision of chronic care management services.   Care Plan : Medication Management  Updates made by De Hollingshead, RPH-CPP since 08/20/2021 12:00 AM     Problem: CHF, COPD      Long-Range Goal: Disease Progression Prevention   Recent Progress: On track  Priority: High  Note:   Current Barriers:  Unable to independently afford treatment regimen Complex patient with multiple comorbidities and low health literacy  Pharmacist Clinical Goal(s):  Over the next 90 days, patient will verbalize ability to afford treatment regimen. Over the next 90 days, patient will adhere to prescribed medication  regimen.  Interventions: 1:1 collaboration with McLean-Scocuzza, Nino Glow, MD regarding development and update of comprehensive plan of care as evidenced by provider attestation and co-signature Inter-disciplinary care team collaboration (see longitudinal plan of care) Comprehensive medication review performed; medication list updated in electronic medical record  Health Maintenance   Yearly diabetic eye exam: due - will discuss moving forward Yearly diabetic foot exam: up to date Urine microalbumin: up to date Yearly influenza vaccination: due - declined Td/Tdap vaccination: up to date Pneumonia vaccination: due - will discuss moving forward  COVID vaccinations: due - declined Shingrix vaccinations: due  - will discuss moving forward Colonoscopy: up to date  Heart Failure, reduced s/p AICD placement:  Appropriately managed; current treatment: follows with Dr. Rockey Situ  ARNI/ACEi/ARB: Delene Loll 24/26 prescribed - however, taking 1/2 of 97/103 BID as this is the dose he had at home from PAP Beta blocker: carvedilol 3.125 mg BID SGLT2: none, recommend moving  forward Mineralocorticoid Receptor Antagonist: held at hospitalization, recommend re-initiation moving forward Diuretic: torsemide 20 mg daily Current home vitals: reports home BP remaining 110-130s/60-70s Approved for Entresto assistance from [employer] through 07/28/22 Discussed role of HF APP. Reviewed upcoming appointment.  Confirms understanding of importance of weighing daily. Confirms understanding to contact cardiology/primary care team if weight gain >3 lbs in 1 day or >5 lbs in 1 week Dr. Rockey Situ sent order for 24/26 mg this morning, however, patient may need to remain on ~49/51 mg given BP at home on 1/2 tab of 97/103 mg. He will follow up with HF clinic later this week He would qualify for assistance for Jardiance from [facility], if SGLT2 added. Will recommend this to HF APP.   CAD (s/p prior stenting),  Hyperlipidemia: Controlled;  current treatment: atorvastatin 80 mg daily, ezetimibe 10 mg daily Antiplatelet regimen: aspirin 81 mg daily, clopidogrel 75 mg daily Previously recommended to continue current regimen at this time.   Chronic Obstructive Pulmonary Disease: Controlled; current treatment: Stiolto 2.5/2.5 mcg, 2 puffs daily; no use of albuterol or nebulizer recently.  Tobacco free since 10/2019. Praised for continued abstinence.  Approved for PAP through [employer] for Stiolto through 2023 Reviewed refill protocol. Recommended to continue current regimen at this time  Anxiety: Inappropriately controlled; current treatment: alprazolam 0.125 mg up to TID PRN, patient taking BID-TID for anxiety Continue to reiterate risks of regular benzodiazepine use. Patient declined addition of SSRI/SNRI at prior PCP visit Stress related to recent hospitalization and healthcare needs. Referral for RN CM today  Patient Goals/Self-Care Activities Over the next 90 days, patient will:  - take medications as prescribed focus on medication adherence by collaboration to investigate insurance  weigh daily, and contact provider if weight gain of >3 lbs/day or >5 lbs/week collaborate with provider on medication access solutions      Plan: Telephone follow up appointment with care management team member scheduled for:  8 weeks  Catie Darnelle Maffucci, PharmD, [place], [place] Pharmacist [employer] [facility] [employer] [phone]

## 2021-08-20 NOTE — Telephone Encounter (Signed)
Ian Mills 66 year old male is requesting preoperative cardiac evaluation for skin grafting.  He was last seen in the clinic on 05/02/2021.  During that time he reported that he had quit smoking 2/22.  He denied chest pain and remained active fishing and kayaking.  He denied shortness of breath he reported compliance with his medications.  His EKG showed normal sinus rhythm 64 bpm with old anterior MI.  There is cardiac catheterization 08/30/2019 showed significant underlying three-vessel coronary artery disease with chronically occluded mid LAD, patent first diagonal stent, patent RCA stents, and mild in-stent restenosis, patent left circumflex stent with moderate in-stent restenosis.  He was also noted to have progression of his ostial left circumflex to 60%.  He was noted to have moderately elevated LVEDP 25-30 mmHg.   May his aspirin and Plavix be held prior to his procedure?  Thank you for your help.  Please direct your response to [facility] preop pool.  Jossie Ng. Frona Yost NP-C    08/20/2021, 12:45 PM [address] Office [phone] Fax [phone]

## 2021-08-21 DIAGNOSIS — L03114 Cellulitis of left upper limb: Secondary | ICD-10-CM | POA: Diagnosis not present

## 2021-08-21 DIAGNOSIS — B950 Streptococcus, group A, as the cause of diseases classified elsewhere: Secondary | ICD-10-CM | POA: Diagnosis not present

## 2021-08-21 DIAGNOSIS — I130 Hypertensive heart and chronic kidney disease with heart failure and stage 1 through stage 4 chronic kidney disease, or unspecified chronic kidney disease: Secondary | ICD-10-CM | POA: Diagnosis not present

## 2021-08-21 DIAGNOSIS — N1831 Chronic kidney disease, stage 3a: Secondary | ICD-10-CM | POA: Diagnosis not present

## 2021-08-21 DIAGNOSIS — I5043 Acute on chronic combined systolic (congestive) and diastolic (congestive) heart failure: Secondary | ICD-10-CM | POA: Diagnosis not present

## 2021-08-21 DIAGNOSIS — I2510 Atherosclerotic heart disease of native coronary artery without angina pectoris: Secondary | ICD-10-CM | POA: Diagnosis not present

## 2021-08-21 NOTE — Progress Notes (Signed)
Pt has been scheduled.  °

## 2021-08-22 NOTE — Telephone Encounter (Signed)
° °  Name: Ian Mills  DOB: Apr 23, 1956  MRN: [id number]   Primary Cardiologist: Ida Rogue, MD  Chart reviewed as part of pre-operative protocol coverage. Patient was contacted 08/22/2021 in reference to pre-operative risk assessment for pending surgery as outlined below.  Ian Mills was last seen on 05/02/21 by Dr. Rockey Situ.  Since that day, Ian Mills has done well. He can complete 4.0 METS without angina.   Per Dr. Rockey Situ: Given severe coronary disease, would prefer he stay on aspirin if possible during skin grafting  Okay to stop Plavix 5 days  If unable to perform skin graft on aspirin, surgical team can let us know, risk within be deferred to the patient   Therefore, based on ACC/AHA guidelines, the patient would be at acceptable risk for the planned procedure without further cardiovascular testing.   The patient was advised that if he develops new symptoms prior to surgery to contact our office to arrange for a follow-up visit, and he verbalized understanding.  I will route this recommendation to the requesting party via Epic fax function and remove from pre-op pool. Please call with questions.  Tami Lin Nekesha Font, PA 08/22/2021, 3:09 PM

## 2021-08-23 ENCOUNTER — Other Ambulatory Visit: Payer: Self-pay

## 2021-08-23 ENCOUNTER — Encounter: Payer: Self-pay | Admitting: Family

## 2021-08-23 ENCOUNTER — Ambulatory Visit: Payer: Medicare Other | Attending: Family | Admitting: Family

## 2021-08-23 ENCOUNTER — Ambulatory Visit: Payer: Medicare Other | Admitting: Urology

## 2021-08-23 ENCOUNTER — Ambulatory Visit (INDEPENDENT_AMBULATORY_CARE_PROVIDER_SITE_OTHER): Payer: Medicare Other | Admitting: Urology

## 2021-08-23 ENCOUNTER — Encounter: Payer: Self-pay | Admitting: Urology

## 2021-08-23 VITALS — BP 128/75 | HR 61 | Ht 69.0 in | Wt 195.0 lb

## 2021-08-23 VITALS — BP 131/74 | HR 64 | Resp 18 | Ht 70.0 in | Wt 196.0 lb

## 2021-08-23 DIAGNOSIS — Z87891 Personal history of nicotine dependence: Secondary | ICD-10-CM | POA: Diagnosis not present

## 2021-08-23 DIAGNOSIS — E785 Hyperlipidemia, unspecified: Secondary | ICD-10-CM | POA: Insufficient documentation

## 2021-08-23 DIAGNOSIS — N189 Chronic kidney disease, unspecified: Secondary | ICD-10-CM | POA: Diagnosis not present

## 2021-08-23 DIAGNOSIS — G479 Sleep disorder, unspecified: Secondary | ICD-10-CM | POA: Insufficient documentation

## 2021-08-23 DIAGNOSIS — Z8249 Family history of ischemic heart disease and other diseases of the circulatory system: Secondary | ICD-10-CM | POA: Insufficient documentation

## 2021-08-23 DIAGNOSIS — Z955 Presence of coronary angioplasty implant and graft: Secondary | ICD-10-CM | POA: Diagnosis not present

## 2021-08-23 DIAGNOSIS — Z8674 Personal history of sudden cardiac arrest: Secondary | ICD-10-CM | POA: Diagnosis not present

## 2021-08-23 DIAGNOSIS — I10 Essential (primary) hypertension: Secondary | ICD-10-CM

## 2021-08-23 DIAGNOSIS — R339 Retention of urine, unspecified: Secondary | ICD-10-CM | POA: Diagnosis not present

## 2021-08-23 DIAGNOSIS — I700 Atherosclerosis of aorta: Secondary | ICD-10-CM | POA: Diagnosis not present

## 2021-08-23 DIAGNOSIS — Z95810 Presence of automatic (implantable) cardiac defibrillator: Secondary | ICD-10-CM | POA: Diagnosis not present

## 2021-08-23 DIAGNOSIS — I130 Hypertensive heart and chronic kidney disease with heart failure and stage 1 through stage 4 chronic kidney disease, or unspecified chronic kidney disease: Secondary | ICD-10-CM | POA: Insufficient documentation

## 2021-08-23 DIAGNOSIS — B950 Streptococcus, group A, as the cause of diseases classified elsewhere: Secondary | ICD-10-CM | POA: Diagnosis not present

## 2021-08-23 DIAGNOSIS — I5020 Unspecified systolic (congestive) heart failure: Secondary | ICD-10-CM | POA: Diagnosis not present

## 2021-08-23 DIAGNOSIS — F419 Anxiety disorder, unspecified: Secondary | ICD-10-CM | POA: Insufficient documentation

## 2021-08-23 DIAGNOSIS — S41102D Unspecified open wound of left upper arm, subsequent encounter: Secondary | ICD-10-CM | POA: Diagnosis not present

## 2021-08-23 DIAGNOSIS — J449 Chronic obstructive pulmonary disease, unspecified: Secondary | ICD-10-CM | POA: Insufficient documentation

## 2021-08-23 DIAGNOSIS — I472 Ventricular tachycardia, unspecified: Secondary | ICD-10-CM

## 2021-08-23 DIAGNOSIS — N138 Other obstructive and reflux uropathy: Secondary | ICD-10-CM | POA: Diagnosis not present

## 2021-08-23 DIAGNOSIS — I2510 Atherosclerotic heart disease of native coronary artery without angina pectoris: Secondary | ICD-10-CM | POA: Insufficient documentation

## 2021-08-23 DIAGNOSIS — I5043 Acute on chronic combined systolic (congestive) and diastolic (congestive) heart failure: Secondary | ICD-10-CM | POA: Diagnosis not present

## 2021-08-23 DIAGNOSIS — N1831 Chronic kidney disease, stage 3a: Secondary | ICD-10-CM | POA: Diagnosis not present

## 2021-08-23 DIAGNOSIS — L03114 Cellulitis of left upper limb: Secondary | ICD-10-CM | POA: Insufficient documentation

## 2021-08-23 DIAGNOSIS — X58XXXD Exposure to other specified factors, subsequent encounter: Secondary | ICD-10-CM | POA: Insufficient documentation

## 2021-08-23 DIAGNOSIS — Z79899 Other long term (current) drug therapy: Secondary | ICD-10-CM | POA: Diagnosis not present

## 2021-08-23 DIAGNOSIS — I5022 Chronic systolic (congestive) heart failure: Secondary | ICD-10-CM | POA: Diagnosis not present

## 2021-08-23 DIAGNOSIS — N401 Enlarged prostate with lower urinary tract symptoms: Secondary | ICD-10-CM

## 2021-08-23 DIAGNOSIS — N529 Male erectile dysfunction, unspecified: Secondary | ICD-10-CM

## 2021-08-23 DIAGNOSIS — I959 Hypotension, unspecified: Secondary | ICD-10-CM | POA: Diagnosis not present

## 2021-08-23 DIAGNOSIS — Z125 Encounter for screening for malignant neoplasm of prostate: Secondary | ICD-10-CM | POA: Diagnosis not present

## 2021-08-23 DIAGNOSIS — T148XXA Other injury of unspecified body region, initial encounter: Secondary | ICD-10-CM

## 2021-08-23 LAB — BLADDER SCAN AMB NON-IMAGING

## 2021-08-23 MED ORDER — TADALAFIL 10 MG PO TABS
10.0000 mg | ORAL_TABLET | Freq: Every day | ORAL | 11 refills | Status: DC | PRN
Start: 2021-08-23 — End: 2022-10-09

## 2021-08-23 MED ORDER — SACUBITRIL-VALSARTAN 49-51 MG PO TABS: 1.0000 | ORAL_TABLET | Freq: Two times a day (BID) | ORAL | 3 refills | Status: DC

## 2021-08-23 MED ORDER — TADALAFIL 10 MG PO TABS: 10.0000 mg | ORAL_TABLET | Freq: Every day | ORAL | 11 refills | Status: DC | PRN

## 2021-08-23 MED ORDER — TORSEMIDE 20 MG PO TABS: 20.0000 mg | ORAL_TABLET | Freq: Every day | ORAL | 5 refills | Status: DC

## 2021-08-23 NOTE — Patient Instructions (Signed)
Continue weighing daily and call for an overnight weight gain of 3 pounds or more or a weekly weight gain of more than 5 pounds.  ° °The [facility] will be moving around the corner to [id number] mid-February. Our phone number will remain the same ° °

## 2021-08-23 NOTE — Progress Notes (Signed)
Patient ID: Ian Mills, male    DOB: [birth year], 66 y.o.   MRN: [id number]  HPI  Ian Mills is a 66 y/o male with a history of CAD, hyperlipidemia, HTN, anxiety, aortic atherosclerosis, COPD, VT (AICD) previous tobacco use and chronic heart failure.   Echo report from 07/12/21 reviewed and showed an EF of 25-30%  LHC done 08/30/19 showed: Prox RCA to Mid RCA lesion is 20% stenosed. Prox RCA lesion is 30% stenosed. Prox LAD to Mid LAD lesion is 100% stenosed. 1st Diag-1 lesion is 60% stenosed. Previously placed 1st Diag-2 stent (unknown type) is widely patent. Ost Cx to Prox Cx lesion is 60% stenosed. Mid Cx lesion is 40% stenosed.   1. Significant underlying three-vessel coronary artery disease with chronically occluded mid LAD, patent first diagonal stent, patent RCA stents with mild in-stent restenosis, patent left circumflex stent with moderate in-stent restenosis.  Progression of ostial left circumflex stenosis to 60%. 2.  Left ventricular angiography was not performed due to chronic kidney disease. 3.  Moderately elevated left ventricular end-diastolic pressure between 25 to 30 mmHg.  Admitted 07/29/21 due to septic shock, present on admission with Streptococcus pyogenes growing out of blood cultures and left upper extremity extensive cellulitis. Wound, surgical and ID consults obtained. Initially required pressors due to hypotension.  A CT scan of the arm showed extensive cellulitis but no necrotizing fasciitis. IV antibiotics started but due to fluid overload, changed to PO antibiotics. Given IV lasix with transition to oral diuretics. Discharged after 9 days.   He presents today for his initial visit with a chief complaint of minimal fatigue upon moderate exertion. He says that he's not sure how much of this is actual fatigue versus boredom due to limitation with his left arm. He has associated left arm pain and difficulty sleeping due to left arm pain. He denies any dizziness,  abdominal distention, palpitations, pedal edema, chest pain, shortness of breath or cough.   Hasn't been weighing daily but does have working scales at home.   Past Medical History:  Diagnosis Date   Acute anterior wall MI ([place]) 07/04/1991   a.) LHC: EF 29%; 100% mLAD -> PTCA performed   Anxiety    Aortic atherosclerosis (HCC)    CHF (congestive heart failure) (HCC)    COPD (chronic obstructive pulmonary disease) (HCC)    Coronary artery disease    a.) 3 stents --> 3x18 mm Duet to OM3, 2.5x18 mm Duet to D1, 2.5x13 mm Duet to RPDA. b. 06/2013 PCI: CTO LAD, patent LAD stent, RCA 70p (FFR 0.74--> DES), 48m(DES), RPDA 50%, EF 20%; c. 08/2019 Cath: LM nl, LAD 100p/m, D1 60, D2 patent stent, LCX 60ost/p, 459mSR, RCA 30p, 2058mR.   Family history of lung cancer    HFrEF (heart failure with reduced ejection fraction) ([place]  a. 05/2014 Echo: EF 25-30%; b. 01/2018 Echo ([facility]): EF 30%; c. 06/2021 Echo: EF 25-30%, apical AK, GrI DD, nl RV fxn.   HLD (hyperlipidemia)    HTN (hypertension)    ICD  single,BSX    a. DOI 11/2011; b. S/N# [id number] cardiomyopathy    a. 05/2014 Echo: EF 25-30%; b. 01/2018 Echo ([facility]): EF 30%, c. 06/2021 Echo: EF 25-30%.   Lateral wall myocardial infarction ([place]/08/[place]   a.) LHC: EF 44%; 75% RPDA and 95% OM3; PCI performed and a 3.0 x 18 mm Duet stent placed to OM3[place] current use of antithrombotics/antiplatelets  a.) DAPT therapy (ASA + clopidogrel)   NSTEMI (non-ST elevated myocardial infarction) ([place]) 11/27/2011   a.) LHC: 100% mLAD; intervention deferred opting for medical management.   Personal history of colonic polyps    Pneumonia 08/2018   Sepsis ([place]) 07/29/2021   Syncope and collapse    Ventricular tachycardia 12/01/2011   a.) VT arrest; ROSC achieved --[place] placed.   Past Surgical History:  Procedure Laterality Date   APPENDECTOMY N/A    CARDIAC DEFIBRILLATOR PLACEMENT N/A 12/01/2011   COLONOSCOPY WITH  PROPOFOL N/A 12/22/2020   Procedure: COLONOSCOPY WITH PROPOFOL;  Surgeon: Jonathon Bellows, MD;  Location[facility] ENDOSCOPY;  Service: Gastroenterology;  Laterality: N/A;   CORONARY ANGIOPLASTY Left 07/05/1991   Procedure: CORONARY ANGIOPLASTY; Location: [facility]; Surgeon: Doristine Bosworth, MD   CORONARY ANGIOPLASTY Left 08/06/1991   Procedure: CORONARY ANGIOPLASTY; Location: [facility]; Surgeon: Ellouise Newer, MD   CORONARY ANGIOPLASTY Left 08/09/1991   Procedure: CORONARY ANGIOPLASTY; Location: [facility]; Surgeon: Ellouise Newer, MD   CORONARY ANGIOPLASTY Left 10/28/1991   Procedure: CORONARY ANGIOPLASTY; Location: [facility]; Surgeon: Ellouise Newer, MD   CORONARY ANGIOPLASTY Left 11/02/1991   Procedure: CORONARY ANGIOPLASTY; Location: [facility]; Surgeon: Ellouise Newer, MD   CORONARY ANGIOPLASTY Left 01/10/1992   Procedure: CORONARY ANGIOPLASTY; Location: [facility]; Surgeon: Carles Collet, MD   CORONARY ANGIOPLASTY Left 09/26/1993   Procedure: CORONARY ANGIOPLASTY; Location: [facility]; Surgeon: Melton Alar, MD   CORONARY ANGIOPLASTY Left 09/11/2006   Procedure: CORONARY ANGIOPLASTY; Location: [facility]   CORONARY ANGIOPLASTY Left 01/17/2010   Procedure: CORONARY ANGIOPLASTY; Location: [facility]; Surgeon: Wendee Beavers, MD   CORONARY ANGIOPLASTY Left 11/27/2011   Procedure: CORONARY ANGIOPLASTY; Location: [facility]; Surgeon: Derinda Sis, MD   CORONARY ANGIOPLASTY WITH STENT PLACEMENT Left 06/29/1997   Procedure: CORONARY ANGIOPLASTY WITH STENT PLACEMENT (3.0 x 18 mm Diet stent to OM3); Location: [facility]; Surgeon: Allie Bossier, MD   CORONARY ANGIOPLASTY WITH STENT PLACEMENT Left 07/12/1998   Procedure: CORONARY ANGIOPLASTY WITH STENT PLACEMENT (2.8 x 8 mm Duet stent to D1, 2.5 x 13 mm Duet stent to RPDA); Location: [facility]   LEFT HEART CATH AND CORONARY ANGIOGRAPHY Left 08/30/2019   Procedure: LEFT HEART CATH AND CORONARY ANGIOGRAPHY;  Surgeon: Wellington Hampshire, MD;  Location: [facility];  Service: Cardiovascular;  Laterality: Left;   PROSTATE  SURGERY     urethra grew around prostate    TUMOR REMOVAL  2001   chest thymic cyst; behind lungs [facility] benign    Family History  Problem Relation Age of Onset   Cancer - Lung Mother    Hypertension Mother    Heart attack Mother    Cancer Father        larynx   Dementia Maternal Grandmother    Heart attack Paternal Grandfather    Colon cancer Neg Hx    Stomach cancer Neg Hx    Pancreatic cancer Neg Hx    Social History   Tobacco Use   Smoking status: Former    Packs/day: 0.50    Years: 42.00    Pack years: 21.00    Types: Cigarettes    Quit date: 11/10/2019    Years since quitting: 1.7   Smokeless tobacco: Never  Substance Use Topics   Alcohol use: Yes    Alcohol/week: 8.0 standard drinks    Types: 8 Cans of beer per week    Comment: per week   Allergies  Allergen Reactions   Nitroglycerin Nausea Only and Other (See Comments)    Per patient "causes severe  headache"  Prior to Admission medications   Medication Sig Start Date End Date Taking? Authorizing Provider  albuterol (VENTOLIN HFA) 108 (90 Base) MCG/ACT inhaler Inhale 1 puff into the lungs every 6 (six) hours as needed for wheezing or shortness of breath.   Yes [provider]  ALPRAZolam (XANAX) 0.5 MG tablet Take 1 tablet (0.5 mg total) by mouth 3 (three) times daily as needed. for anxiety 06/26/21  Yes McLean-Scocuzza, Nino Glow, MD  amiodarone (PACERONE) 200 MG tablet Take 1 tablet (200 mg total) by mouth daily. 06/11/21  Yes Deboraha Sprang, MD  amoxicillin (AMOXIL) 500 MG capsule Take 1 capsule (500 mg total) by mouth 3 (three) times daily. 08/14/21  Yes Tsosie Billing, MD  atorvastatin (LIPITOR) 80 MG tablet Take 1 tablet (80 mg total) by mouth daily. 02/14/21  Yes McLean-Scocuzza, Nino Glow, MD  carvedilol (COREG) 3.125 MG tablet Take 1 tablet (3.125 mg total) by mouth 2 (two) times daily. 05/02/21  Yes Minna Merritts, MD  ezetimibe (ZETIA) 10 MG tablet Take 1 tablet (10 mg total) by mouth  daily. 06/26/21  Yes McLean-Scocuzza, Nino Glow, MD  ipratropium-albuterol (DUONEB) 0.5-2.5 (3) MG/3ML SOLN Take 3 mLs by nebulization every 6 (six) hours as needed. 08/08/20  Yes McLean-Scocuzza, Nino Glow, MD  mupirocin ointment (BACTROBAN) 2 % Apply 1 application topically 2 (two) times daily. 08/17/21  Yes McLean-Scocuzza, Nino Glow, MD  oxyCODONE-acetaminophen (PERCOCET/ROXICET) 5-325 MG tablet Take 1 tablet by mouth 2 (two) times daily. 08/17/21  Yes McLean-Scocuzza, Nino Glow, MD  potassium chloride (KLOR-CON 10) 10 MEQ tablet Take 1 tablet (10 mEq total) by mouth daily. 06/26/21  Yes McLean-Scocuzza, Nino Glow, MD  sacubitril-valsartan (ENTRESTO) 97-103 MG Take 1/2 tablet by mouth 2 (two) times daily. 08/20/21  Yes Gollan, Kathlene November, MD  silver sulfADIAZINE (SILVADENE) 1 % cream Apply topically 2 (two) times daily. 08/17/21  Yes McLean-Scocuzza, Nino Glow, MD  tadalafil (CIALIS) 10 MG tablet Take 1-2 tablets (10-20 mg total) by mouth daily as needed for erectile dysfunction. 06/29/20  Yes Billey Co, MD  Tiotropium Bromide-Olodaterol (STIOLTO RESPIMAT) 2.5-2.5 MCG/ACT AERS Inhale 2 puffs into the lungs daily. 08/12/19  Yes Tyler Pita, MD  torsemide (DEMADEX) 20 MG tablet Take 1 tablet (20 mg total) by mouth daily. 08/07/21  Yes Wieting, Richard, MD  aspirin 81 MG EC tablet Take 1 tablet (81 mg total) by mouth daily. Patient not taking: Reported on 08/23/2021 09/11/15   Minna Merritts, MD  clopidogrel (PLAVIX) 75 MG tablet Take 1 tablet (75 mg total) by mouth daily. Patient not taking: Reported on 08/23/2021 06/26/21   McLean-Scocuzza, Nino Glow, MD   Review of Systems  Constitutional:  Positive for fatigue. Negative for appetite change.  HENT:  Negative for congestion, postnasal drip and sore throat.   Eyes: Negative.   Respiratory:  Negative for chest tightness and shortness of breath.   Cardiovascular:  Negative for chest pain, palpitations and leg swelling.  Gastrointestinal:  Negative for  abdominal distention and abdominal pain.  Endocrine: Negative.   Genitourinary: Negative.   Musculoskeletal:  Positive for arthralgias (left arm). Negative for back pain.  Skin: Negative.   Allergic/Immunologic: Negative.   Neurological:  Negative for dizziness and light-headedness.  Hematological:  Negative for adenopathy. Does not bruise/bleed easily.  Psychiatric/Behavioral:  Positive for sleep disturbance (due to left arm pain). Negative for dysphoric mood. The patient is not nervous/anxious.    Vitals:   08/23/21 1208  BP: 131/74  Pulse: 64  Resp:  18  SpO2: 100%  Weight: 196 lb (88.9 kg)  Height: _0  (1.778 m)   Wt Readings from Last 3 Encounters:  08/23/21 196 lb (88.9 kg)  08/17/21 198 lb (89.8 kg)  08/14/21 198 lb (89.8 kg)   Lab Results  Component Value Date   CREATININE 1.08 08/07/2021   CREATININE 1.30 (H) 08/06/2021   CREATININE 0.99 08/04/2021   Physical Exam Vitals and nursing note reviewed. Exam conducted with a chaperone present (wife).  Constitutional:      Appearance: Normal appearance.  HENT:     Head: Normocephalic and atraumatic.  Cardiovascular:     Rate and Rhythm: Normal rate and regular rhythm.  Pulmonary:     Effort: Pulmonary effort is normal. No respiratory distress.     Breath sounds: No wheezing or rales.  Abdominal:     General: There is no distension.     Palpations: Abdomen is soft.  Musculoskeletal:        General: No tenderness.     Cervical back: Normal range of motion and neck supple.     Right lower leg: No edema.     Left lower leg: No edema.  Skin:    General: Skin is warm and dry.  Neurological:     General: No focal deficit present.     Mental Status: He is alert and oriented to person, place, and time.  Psychiatric:        Mood and Affect: Mood normal.        Behavior: Behavior normal.    Assessment & Plan:  1: Chronic heart failure with reduced ejection fraction- - NYHA class II - euvolemic today - has  working scales; instructed to weigh every morning after using the bathroom and call for an overnight weight gain of > 2 pounds or a weekly weight gain of >5 pounds - not adding salt and has been reading food labels for sodium content; low sodium cookbook provided today - saw cardiology Rockey Situ) 05/02/21; returns March 2023 - on GDMT of carvedilol & entresto (getting entresto through [employer] through PCP office) - was on spironolactone but then stopped during recent admission due to worsening renal disease thought to be due to overdiuresis - discussed jardiance use and he is interested but wants to speak with Dr Caryl Comes at his upcoming appointment next month; we can supply 30 day voucher and then pharmacist at PCP office said she would help with patient assistance forms; he will let us know if he needs anything from our office - new RX sent in for entresto 49/42m as he's currently taking 1/2 tablet of his 97/1039mdose BID and BP today is mildly elevated - no BNP to report  2: HTN- - BP mildly elevated (131/74) - saw PCP (McLean-Scocuzza) 08/17/21 - BMP 08/07/21 reviewed and showed sodium 132, potassium 4.1, creatinine 1.08 & GFR >60  3: VT- - saw EP (KCaryl Comes8/16/22; returns next month - has AICD present  4: Left arm open wound- - saw ID (Ravishankar) 08/14/21 - scheduled for myriad matrix to facilitate healing after debridement of exudative islands on 08/24/21   Medication list reviewed.   Return in 3 months, sooner if needed

## 2021-08-23 NOTE — Progress Notes (Signed)
° °  08/23/2021 3:11 PM   Ian Mills Dec 18, 1955 [id number]  Reason for visit: Follow up history of BPH, ED, PSA screening  HPI: Comorbid 66 year old male who had urinary retention 4 to 5 years ago in [place] and underwent an outlet procedure with likely a TURP at that time.  Those records are not available to me.  He has been urinating well since that time with no changes in urination over the last year, and PVR is normal at 0 mL today.  PSA November 2022 remains normal at 0.74.  He continues to take the Cialis 10 to 20 mg on demand for ED.  Risks and benefits discussed, and this was refilled.  We reviewed this could be taken as needed, or every other day.  Return precautions discussed.  Cialis refilled Continue PSA screening via PCP RTC 1 year PVR  Billey Co, MD  [facility] [address], [place], [place] [address] [phone]

## 2021-08-24 ENCOUNTER — Ambulatory Visit
Admission: RE | Admit: 2021-08-24 | Discharge: 2021-08-24 | Disposition: A | Discharge status: Home / Self Care | Payer: Medicare Other | Attending: Surgery | Admitting: Surgery

## 2021-08-24 ENCOUNTER — Encounter: Payer: Self-pay | Admitting: Surgery

## 2021-08-24 ENCOUNTER — Encounter
Admission: RE | Disposition: A | Discharge status: Home / Self Care | Payer: Self-pay | Source: Home / Self Care | Attending: Surgery

## 2021-08-24 ENCOUNTER — Ambulatory Visit: Payer: Medicare Other | Admitting: Urgent Care

## 2021-08-24 ENCOUNTER — Other Ambulatory Visit: Payer: Self-pay

## 2021-08-24 DIAGNOSIS — I110 Hypertensive heart disease with heart failure: Secondary | ICD-10-CM | POA: Diagnosis not present

## 2021-08-24 DIAGNOSIS — Z87891 Personal history of nicotine dependence: Secondary | ICD-10-CM | POA: Insufficient documentation

## 2021-08-24 DIAGNOSIS — X58XXXA Exposure to other specified factors, initial encounter: Secondary | ICD-10-CM | POA: Insufficient documentation

## 2021-08-24 DIAGNOSIS — S41102D Unspecified open wound of left upper arm, subsequent encounter: Secondary | ICD-10-CM

## 2021-08-24 DIAGNOSIS — Y939 Activity, unspecified: Secondary | ICD-10-CM | POA: Diagnosis not present

## 2021-08-24 DIAGNOSIS — S51802A Unspecified open wound of left forearm, initial encounter: Secondary | ICD-10-CM | POA: Diagnosis not present

## 2021-08-24 DIAGNOSIS — I252 Old myocardial infarction: Secondary | ICD-10-CM | POA: Diagnosis not present

## 2021-08-24 DIAGNOSIS — I2510 Atherosclerotic heart disease of native coronary artery without angina pectoris: Secondary | ICD-10-CM | POA: Diagnosis not present

## 2021-08-24 DIAGNOSIS — I5022 Chronic systolic (congestive) heart failure: Secondary | ICD-10-CM | POA: Insufficient documentation

## 2021-08-24 DIAGNOSIS — S41102A Unspecified open wound of left upper arm, initial encounter: Secondary | ICD-10-CM | POA: Diagnosis not present

## 2021-08-24 DIAGNOSIS — S91102A Unspecified open wound of left great toe without damage to nail, initial encounter: Secondary | ICD-10-CM | POA: Diagnosis not present

## 2021-08-24 DIAGNOSIS — Z955 Presence of coronary angioplasty implant and graft: Secondary | ICD-10-CM | POA: Diagnosis not present

## 2021-08-24 DIAGNOSIS — F419 Anxiety disorder, unspecified: Secondary | ICD-10-CM | POA: Diagnosis not present

## 2021-08-24 HISTORY — PX: GRAFT APPLICATION: SHX6696

## 2021-08-24 HISTORY — DX: Long term (current) use of antithrombotics/antiplatelets: Z79.02

## 2021-08-24 HISTORY — DX: Atherosclerosis of aorta: I70.0

## 2021-08-24 SURGERY — GRAFT APPLICATION
Anesthesia: General | Site: Arm Upper | Laterality: Left

## 2021-08-24 MED ORDER — FAMOTIDINE 20 MG PO TABS
ORAL_TABLET | ORAL | Status: AC
  Filled 2021-08-24: qty 1

## 2021-08-24 MED ORDER — FAMOTIDINE 20 MG PO TABS
20.0000 mg | ORAL_TABLET | Freq: Once | ORAL | Status: AC
  Administered 2021-08-24: 20 mg via ORAL

## 2021-08-24 MED ORDER — PROPOFOL 10 MG/ML IV BOLUS
INTRAVENOUS | Status: DC | PRN
  Administered 2021-08-24: 150 mg via INTRAVENOUS

## 2021-08-24 MED ORDER — DEXAMETHASONE SODIUM PHOSPHATE 10 MG/ML IJ SOLN
INTRAMUSCULAR | Status: DC | PRN
  Administered 2021-08-24: 10 mg via INTRAVENOUS

## 2021-08-24 MED ORDER — FENTANYL CITRATE (PF) 100 MCG/2ML IJ SOLN
25.0000 ug | INTRAMUSCULAR | Status: DC | PRN
  Administered 2021-08-24 (×3): 25 ug via INTRAVENOUS

## 2021-08-24 MED ORDER — ACETAMINOPHEN 325 MG PO TABS: 650.0000 mg | ORAL_TABLET | Freq: Three times a day (TID) | ORAL | 0 refills | Status: DC | PRN

## 2021-08-24 MED ORDER — BUPIVACAINE-EPINEPHRINE 0.5% -1:200000 IJ SOLN
INTRAMUSCULAR | Status: DC | PRN
Start: 2021-08-24 — End: 2021-08-24
  Administered 2021-08-24: 5 mL

## 2021-08-24 MED ORDER — CEFAZOLIN SODIUM-DEXTROSE 2-4 GM/100ML-% IV SOLN
2.0000 g | INTRAVENOUS | Status: AC
  Administered 2021-08-24: 2 g via INTRAVENOUS

## 2021-08-24 MED ORDER — LIDOCAINE HCL (CARDIAC) PF 100 MG/5ML IV SOSY
PREFILLED_SYRINGE | INTRAVENOUS | Status: DC | PRN
  Administered 2021-08-24: 100 mg via INTRAVENOUS

## 2021-08-24 MED ORDER — HYDROMORPHONE HCL 1 MG/ML IJ SOLN
INTRAMUSCULAR | Status: DC | PRN
  Administered 2021-08-24: 1 mg via INTRAVENOUS

## 2021-08-24 MED ORDER — BUPIVACAINE-EPINEPHRINE (PF) 0.5% -1:200000 IJ SOLN
INTRAMUSCULAR | Status: AC
  Filled 2021-08-24: qty 30

## 2021-08-24 MED ORDER — CHLORHEXIDINE GLUCONATE CLOTH 2 % EX PADS: 6.0000 | MEDICATED_PAD | Freq: Once | CUTANEOUS | Status: DC

## 2021-08-24 MED ORDER — CEFAZOLIN SODIUM-DEXTROSE 2-4 GM/100ML-% IV SOLN
INTRAVENOUS | Status: AC
  Filled 2021-08-24: qty 100

## 2021-08-24 MED ORDER — DOCUSATE SODIUM 100 MG PO CAPS: 100.0000 mg | ORAL_CAPSULE | Freq: Two times a day (BID) | ORAL | 0 refills | Status: AC | PRN

## 2021-08-24 MED ORDER — CHLORHEXIDINE GLUCONATE 0.12 % MT SOLN
15.0000 mL | Freq: Once | OROMUCOSAL | Status: AC
  Administered 2021-08-24: 15 mL via OROMUCOSAL

## 2021-08-24 MED ORDER — EPHEDRINE SULFATE (PRESSORS) 50 MG/ML IJ SOLN
INTRAMUSCULAR | Status: DC | PRN
  Administered 2021-08-24: 10 mg via INTRAVENOUS
  Administered 2021-08-24: 15 mg via INTRAVENOUS

## 2021-08-24 MED ORDER — PHENYLEPHRINE 40 MCG/ML (10ML) SYRINGE FOR IV PUSH (FOR BLOOD PRESSURE SUPPORT)
PREFILLED_SYRINGE | INTRAVENOUS | Status: DC | PRN
  Administered 2021-08-24: 300 ug via INTRAVENOUS

## 2021-08-24 MED ORDER — LACTATED RINGERS IV SOLN: INTRAVENOUS | Status: DC | PRN

## 2021-08-24 MED ORDER — IBUPROFEN 800 MG PO TABS: 800.0000 mg | ORAL_TABLET | Freq: Three times a day (TID) | ORAL | 0 refills | Status: DC | PRN

## 2021-08-24 MED ORDER — CHLORHEXIDINE GLUCONATE 0.12 % MT SOLN
OROMUCOSAL | Status: AC
  Filled 2021-08-24: qty 15

## 2021-08-24 MED ORDER — LACTATED RINGERS IV SOLN: INTRAVENOUS | Status: DC

## 2021-08-24 MED ORDER — FENTANYL CITRATE (PF) 100 MCG/2ML IJ SOLN
INTRAMUSCULAR | Status: AC
  Administered 2021-08-24: 25 ug via INTRAVENOUS
  Filled 2021-08-24: qty 2

## 2021-08-24 MED ORDER — KETAMINE HCL 10 MG/ML IJ SOLN
INTRAMUSCULAR | Status: DC | PRN
Start: 2021-08-24 — End: 2021-08-24
  Administered 2021-08-24: 50 mg via INTRAVENOUS

## 2021-08-24 MED ORDER — PHENYLEPHRINE HCL-NACL 20-0.9 MG/250ML-% IV SOLN
INTRAVENOUS | Status: AC
  Filled 2021-08-24: qty 250

## 2021-08-24 MED ORDER — 0.9 % SODIUM CHLORIDE (POUR BTL) OPTIME
TOPICAL | Status: DC | PRN
  Administered 2021-08-24: 500 mL

## 2021-08-24 MED ORDER — ONDANSETRON HCL 4 MG/2ML IJ SOLN
INTRAMUSCULAR | Status: DC | PRN
  Administered 2021-08-24: 4 mg via INTRAVENOUS

## 2021-08-24 MED ORDER — ORAL CARE MOUTH RINSE: 15.0000 mL | Freq: Once | OROMUCOSAL | Status: AC

## 2021-08-24 MED ORDER — HYDROMORPHONE HCL 1 MG/ML IJ SOLN
INTRAMUSCULAR | Status: AC
  Filled 2021-08-24: qty 1

## 2021-08-24 SURGICAL SUPPLY — 29 items
BNDG COHESIVE 6X5 TAN ST LF (GAUZE/BANDAGES/DRESSINGS) ×2 IMPLANT
BNDG CONFORM 2 STRL LF (GAUZE/BANDAGES/DRESSINGS) IMPLANT
CANISTER WOUND CARE 500ML ATS (WOUND CARE) ×2 IMPLANT
CHLORAPREP W/TINT 26 (MISCELLANEOUS) ×2 IMPLANT
ELECT REM PT RETURN 9FT ADLT (ELECTROSURGICAL) ×2
ELECTRODE REM PT RTRN 9FT ADLT (ELECTROSURGICAL) ×1 IMPLANT
GAUZE SPONGE 4X4 12PLY STRL (GAUZE/BANDAGES/DRESSINGS) IMPLANT
GAUZE XEROFORM 4X4 STRL (GAUZE/BANDAGES/DRESSINGS) ×1 IMPLANT
GLOVE SURG SYN 6.5 ES PF (GLOVE) ×2 IMPLANT
GLOVE SURG SYN 6.5 PF PI (GLOVE) ×1 IMPLANT
GLOVE SURG UNDER POLY LF SZ7 (GLOVE) ×2 IMPLANT
GOWN STRL REUS W/ TWL LRG LVL3 (GOWN DISPOSABLE) ×1 IMPLANT
GOWN STRL REUS W/ TWL XL LVL3 (GOWN DISPOSABLE) ×1 IMPLANT
GOWN STRL REUS W/TWL LRG LVL3 (GOWN DISPOSABLE) ×1
GOWN STRL REUS W/TWL XL LVL3 (GOWN DISPOSABLE) ×1
GRAFT MYRIAD 3 LAYER 5X5 (Graft) ×1 IMPLANT
HANDLE YANKAUER SUCT BULB TIP (MISCELLANEOUS) ×1 IMPLANT
KIT TURNOVER KIT A (KITS) ×2 IMPLANT
LABEL OR SOLS (LABEL) ×2 IMPLANT
MANIFOLD NEPTUNE II (INSTRUMENTS) ×2 IMPLANT
NS IRRIG 500ML POUR BTL (IV SOLUTION) ×2 IMPLANT
PACK EXTREMITY ARMC (MISCELLANEOUS) ×2 IMPLANT
PAD ABD DERMACEA PRESS 5X9 (GAUZE/BANDAGES/DRESSINGS) ×1 IMPLANT
PAD PREP 24X41 OB/GYN DISP (PERSONAL CARE ITEMS) ×2 IMPLANT
SOL PREP PVP 2OZ (MISCELLANEOUS) ×2
SOLUTION PREP PVP 2OZ (MISCELLANEOUS) ×1 IMPLANT
STOCKINETTE IMPERV 14X48 (MISCELLANEOUS) ×2 IMPLANT
SUT VIC AB 3-0 SH 8-18 (SUTURE) ×1 IMPLANT
WATER STERILE IRR 500ML POUR (IV SOLUTION) ×2 IMPLANT

## 2021-08-24 NOTE — Anesthesia Preprocedure Evaluation (Signed)
Anesthesia Evaluation  Patient identified by MRN, date of birth, ID band Patient awake    Reviewed: Allergy & Precautions, NPO status , Patient's Chart, lab work & pertinent test results  History of Anesthesia Complications Negative for: history of anesthetic complications  Airway Mallampati: II  TM Distance: >3 FB Neck ROM: Full    Dental  (+) Chipped, Poor Dentition, Dental Advidsory Given   Pulmonary neg shortness of breath, neg pneumonia , COPD, neg recent URI, former smoker,    Pulmonary exam normal        Cardiovascular hypertension, (-) angina+ CAD, + Past MI, + Cardiac Stents, + Peripheral Vascular Disease and +CHF  Normal cardiovascular exam(-) dysrhythmias + Cardiac Defibrillator (-) Valvular Problems/Murmurs     Neuro/Psych Anxiety negative neurological ROS  negative psych ROS   GI/Hepatic negative GI ROS, Neg liver ROS,   Endo/Other  negative endocrine ROS  Renal/GU negative Renal ROS  negative genitourinary   Musculoskeletal  (+) Arthritis , Osteoarthritis,    Abdominal   Peds negative pediatric ROS (+)  Hematology negative hematology ROS (+)   Anesthesia Other Findings  History of prostate surgery 06/09/2020  Erectile dysfunction 06/09/2020  Healthcare maintenance 03/27/2020  Pain of left heel 03/03/2020  Obesity (BMI 30-39.9) 03/03/2020  Aortic atherosclerosis (HCC) 02/20/2020  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  Essential hypertension 10/13/2019  Unstable angina (HCC)   Carotid artery stenosis 06/10/2019  Baker's cyst 06/10/2019  Bilateral impacted cerumen 43/56/8616  Chronic systolic congestive heart failure ([place]) 06/10/2019  Scoliosis 06/10/2019  Anxiety 04/15/2019  Chronic obstructive pulmonary disease ([place]) 04/15/2019  COPD exacerbation ([place]) 09/08/2018  Chest tightness 02/12/2018  Pain and swelling of left lower  extremity 02/12/2018  Sacroiliac pain 07/31/2016  Acute on chronic systolic CHF (congestive heart failure) ([place]) 06/27/2016  Hypokalemia 06/25/2016  NSTEMI (non-ST elevated myocardial infarction) ([place]) 06/23/2016  Acute exacerbation of CHF (congestive heart failure) ([place]) 06/22/2016  DDD (degenerative disc disease), lumbar 06/13/2016  Tobacco abuse 06/13/2016  Dizziness 03/30/2015  Former smoker 09/29/2014  Syncope 06/01/2014  Insomnia 06/01/2014  Ischemic cardiomyopathy   Ventricular tachycardia ([place]) 01/23/2012  Single implantable cardioverter-defibrillator-BSx 12/11/2011  SYNCOPE AND COLLAPSE 09/26/2010  Hyperlipidemia 02/23/2010  Hypotension 02/23/2010  CAD (coronary artery disease) 02/23/2010    Reproductive/Obstetrics negative OB ROS                             Anesthesia Physical  Anesthesia Plan  ASA: 4  Anesthesia Plan: General   Post-op Pain Management:    Induction: Intravenous  PONV Risk Score and Plan: 2 and Ondansetron, Dexamethasone, Treatment may vary due to age or medical condition and Midazolam  Airway Management Planned: LMA  Additional Equipment:   Intra-op Plan:   Post-operative Plan: Extubation in OR  Informed Consent: I have reviewed the patients History and Physical, chart, labs and discussed the procedure including the risks, benefits and alternatives for the proposed anesthesia with the patient or authorized representative who has indicated his/her understanding and acceptance.       Plan Discussed with: CRNA, Anesthesiologist and Surgeon  Anesthesia Plan Comments:         Anesthesia Quick Evaluation

## 2021-08-24 NOTE — Op Note (Signed)
Pre-Op Dx: left arm wound Post-Op Dx: same Anesthesia: LMA EBL: minimal Complications:  none apparent Specimen: none Procedure: Myriad matrix application for left arm wound Surgeon: Lysle Pearl  Indications for procedure: See H&P  Description of Procedure:  Consent obtained, time out performed.  Patient placed in supine position.  Area sterilized and draped in usual position.  Local infused to area around wound.  Wound bed prepped by removing minimal exudate from base and ensuring no pockets of infection or necrotic tissue.  Healthy granulation tissue visible in the end around entire wound base. Total wound measured 4cm x 2cm x 21m deep. Myriad matrix cut to size and placed over wound, secured with circumferential 3-0 vicryl interrupted sutures.  Entire area then covered with xerofoam gauze, abd pad, secured with kerlex roll.  Pt tolerated procedure well and transferred to PACU in stable conditions.  Sponge and instrument count correct at end of procedure

## 2021-08-24 NOTE — Transfer of Care (Signed)
Immediate Anesthesia Transfer of Care Note  Patient: Ian Mills  Procedure(s) Performed: GRAFT APPLICATION (Left: Arm Upper)  Patient Location: PACU  Anesthesia Type:General  Level of Consciousness: sedated  Airway & Oxygen Therapy: Patient Spontanous Breathing and Patient connected to nasal cannula oxygen  Post-op Assessment: Post -op Vital signs reviewed and stable  Post vital signs: Reviewed and stable  Last Vitals:  Vitals Value Taken Time  BP 99/55 08/24/21 1005  Temp 36.6 C 08/24/21 1005  Pulse 58 08/24/21 1013  Resp 10 08/24/21 1013  SpO2 96 % 08/24/21 1013  Vitals shown include unvalidated device data.  Last Pain:  Vitals:   08/24/21 1005  TempSrc:   PainSc: Asleep         Complications: No notable events documented.

## 2021-08-24 NOTE — Discharge Instructions (Addendum)
Graft placement, Care After This sheet gives you information about how to care for yourself after your procedure. Your health care provider may also give you more specific instructions. If you have problems or questions, contact your health care provider. What can I expect after the procedure? After the procedure, it is common to have: Soreness. Bruising. Itching. Follow these instructions at home: site care  [place].  AVOID ANY PRESSURE TO AREA.  General instructions Rest and then return to your normal activities as told by your health care provider.  tylenol and advil as needed for discomfort.  Please alternate between the two every four hours as needed for pain.    Use narcotics, if prescribed, only when tylenol and motrin is not enough to control pain.  325-650mg  every 8hrs to max of 3000mg /24hrs (including the 325mg  in every norco dose) for the tylenol.    Advil up to 800mg  per dose every 8hrs as needed for pain.   Keep all follow-up visits as told by your health care provider. This is important. Contact a health care provider if: You have redness, swelling, or pain around your site. You have fluid or blood coming from your site. Your site feels warm to the touch. You have pus or a bad smell coming from your site. You have a fever. Your sutures, skin glue, or adhesive strips loosen or come off sooner than expected. Get help right away if: You have bleeding that does not stop with pressure or a dressing. Summary After the procedure, it is common to have some soreness, bruising, and itching at the site. Follow instructions from your health care provider about how to take care of your site. Check your site every day for signs of infection. Contact a health care provider if you have redness, swelling, or pain around your site, or your site feels warm to the touch. Keep all follow-up visits as told by your health care provider. This is  important. This information is not intended to replace advice given to you by your health care provider. Make sure you discuss any questions you have with your health care provider. Document Released: 08/11/2015 Document Revised: 01/12/2018 Document Reviewed: 01/12/2018 Elsevier Interactive Patient Education  2019 [place]   The drugs that you were given will stay in your system until tomorrow so for the next 24 hours you should not:  Drive an automobile Make any legal decisions Drink any alcoholic beverage   You may resume regular meals tomorrow.  Today it is better to start with liquids and gradually work up to solid foods.  You may eat anything you prefer, but it is better to start with liquids, then soup and crackers, and gradually work up to solid foods.   Please notify your doctor immediately if you have any unusual bleeding, trouble breathing, redness and pain at the surgery site, drainage, fever, or pain not relieved by medication.    Additional Instructions:        Please contact your physician with any problems or Same Day Surgery at [phone], Monday through Friday 6 am to 4 pm, or [place] at [facility] number at [phone].

## 2021-08-24 NOTE — Anesthesia Procedure Notes (Signed)
Procedure Name: LMA Insertion Date/Time: 08/24/2021 9:23 AM Performed by: Beverely Low, CRNA Pre-anesthesia Checklist: Patient identified, Patient being monitored, Timeout performed, Emergency Drugs available and Suction available Patient Re-evaluated:Patient Re-evaluated prior to induction Oxygen Delivery Method: Circle system utilized Preoxygenation: Pre-oxygenation with 100% oxygen Induction Type: IV induction Ventilation: Mask ventilation without difficulty LMA: LMA inserted LMA Size: 4.5 Tube type: Oral Number of attempts: 1 Placement Confirmation: positive ETCO2 and breath sounds checked- equal and bilateral Tube secured with: Tape Dental Injury: Teeth and Oropharynx as per pre-operative assessment

## 2021-08-24 NOTE — Interval H&P Note (Signed)
No change. OK to proceed.

## 2021-08-24 NOTE — Anesthesia Postprocedure Evaluation (Signed)
Anesthesia Post Note  Patient: Ian Mills  Procedure(s) Performed: GRAFT APPLICATION (Left: Arm Upper)  Patient location during evaluation: PACU Anesthesia Type: General Level of consciousness: awake and alert Pain management: pain level controlled Vital Signs Assessment: post-procedure vital signs reviewed and stable Respiratory status: spontaneous breathing, nonlabored ventilation, respiratory function stable and patient connected to nasal cannula oxygen Cardiovascular status: blood pressure returned to baseline and stable Postop Assessment: no apparent nausea or vomiting Anesthetic complications: no   No notable events documented.   Last Vitals:  Vitals:   08/24/21 1058 08/24/21 1111  BP: 118/74 130/75  Pulse: 62 65  Resp: 12 15  Temp: (!) 36.1 C (!) 36.1 C  SpO2: 94% 99%    Last Pain:  Vitals:   08/24/21 1111  TempSrc: Temporal  PainSc: 0-No pain                 Martha Clan

## 2021-08-27 ENCOUNTER — Encounter: Payer: Self-pay | Admitting: Surgery

## 2021-08-27 ENCOUNTER — Ambulatory Visit: Payer: Medicare Other | Admitting: *Deleted

## 2021-08-27 DIAGNOSIS — I10 Essential (primary) hypertension: Secondary | ICD-10-CM

## 2021-08-27 DIAGNOSIS — I5042 Chronic combined systolic (congestive) and diastolic (congestive) heart failure: Secondary | ICD-10-CM

## 2021-08-27 NOTE — Chronic Care Management (AMB) (Signed)
°  Care Management   Follow Up Note   08/27/2021 Name: Ian Mills MRN: [id number] DOB: [birth year]   Referred by: McLean-Scocuzza, Nino Glow, MD Reason for referral : Chronic Care Management (INITIAL)   Successful telephone outreach to patient.  RNCM  introduced self and role.  Patient agrees to Ian Mills.  States he is unable to complete initial assessment today due ti being at laundry mat.  Request call another day and time.  Follow Up Plan: The care management team will reach out to the patient again over the next 30 days.    Hubert Azure RN, MSN RN Care Management Coordinator [place] [phone] [email or web address]

## 2021-08-28 ENCOUNTER — Encounter: Payer: Self-pay | Admitting: Surgery

## 2021-08-28 DIAGNOSIS — I5042 Chronic combined systolic (congestive) and diastolic (congestive) heart failure: Secondary | ICD-10-CM | POA: Diagnosis not present

## 2021-08-28 DIAGNOSIS — I25118 Atherosclerotic heart disease of native coronary artery with other forms of angina pectoris: Secondary | ICD-10-CM | POA: Diagnosis not present

## 2021-08-28 DIAGNOSIS — J449 Chronic obstructive pulmonary disease, unspecified: Secondary | ICD-10-CM

## 2021-08-28 DIAGNOSIS — N1831 Chronic kidney disease, stage 3a: Secondary | ICD-10-CM | POA: Diagnosis not present

## 2021-08-28 DIAGNOSIS — L03114 Cellulitis of left upper limb: Secondary | ICD-10-CM | POA: Diagnosis not present

## 2021-08-28 DIAGNOSIS — I130 Hypertensive heart and chronic kidney disease with heart failure and stage 1 through stage 4 chronic kidney disease, or unspecified chronic kidney disease: Secondary | ICD-10-CM | POA: Diagnosis not present

## 2021-08-28 DIAGNOSIS — I2510 Atherosclerotic heart disease of native coronary artery without angina pectoris: Secondary | ICD-10-CM | POA: Diagnosis not present

## 2021-08-28 DIAGNOSIS — E785 Hyperlipidemia, unspecified: Secondary | ICD-10-CM | POA: Diagnosis not present

## 2021-08-28 DIAGNOSIS — I110 Hypertensive heart disease with heart failure: Secondary | ICD-10-CM

## 2021-08-28 DIAGNOSIS — I5043 Acute on chronic combined systolic (congestive) and diastolic (congestive) heart failure: Secondary | ICD-10-CM | POA: Diagnosis not present

## 2021-08-28 DIAGNOSIS — B950 Streptococcus, group A, as the cause of diseases classified elsewhere: Secondary | ICD-10-CM | POA: Diagnosis not present

## 2021-09-03 DIAGNOSIS — Z20822 Contact with and (suspected) exposure to covid-19: Secondary | ICD-10-CM | POA: Diagnosis not present

## 2021-09-04 ENCOUNTER — Other Ambulatory Visit: Payer: Self-pay

## 2021-09-04 MED ORDER — TORSEMIDE 20 MG PO TABS: 20.0000 mg | ORAL_TABLET | Freq: Every day | ORAL | 3 refills | Status: DC

## 2021-09-04 NOTE — Telephone Encounter (Signed)
Please review for 90 day Refill Request.

## 2021-09-04 NOTE — Telephone Encounter (Signed)
*  STAT* If patient is at the pharmacy, call can be transferred to refill team.   1. Which medications need to be refilled? (please list name of each medication and dose if known) TORSEMIDE  2. Which pharmacy/location (including street and city if local pharmacy) is medication to be sent to? [facility] [place]  3. Do they need a 30 day or 90 day supply? [place]

## 2021-09-05 ENCOUNTER — Ambulatory Visit (INDEPENDENT_AMBULATORY_CARE_PROVIDER_SITE_OTHER): Payer: Medicare Other | Admitting: *Deleted

## 2021-09-05 DIAGNOSIS — I10 Essential (primary) hypertension: Secondary | ICD-10-CM

## 2021-09-05 DIAGNOSIS — T148XXA Other injury of unspecified body region, initial encounter: Secondary | ICD-10-CM

## 2021-09-05 DIAGNOSIS — I5042 Chronic combined systolic (congestive) and diastolic (congestive) heart failure: Secondary | ICD-10-CM

## 2021-09-05 NOTE — Patient Instructions (Addendum)
Visit Information   Thank you for taking time to visit with me today. Please don't hesitate to contact me if I can be of assistance to you before our next scheduled telephone appointment.  Following are the goals we discussed today:  Take medications as prescribed   Attend all scheduled provider appointments Perform all self care activities independently  Perform IADL's (shopping, preparing meals, housekeeping, managing finances) independently call office if I gain more than 2 pounds in one day or 5 pounds in one week use salt in moderation watch for swelling in feet, ankles and legs every day weigh myself daily bring diary to all appointments develop a rescue plan follow rescue plan if symptoms flare-up know when to call the doctorbased on weight, heart failure symptoms, wound assessment  Our next appointment is by telephone on 3/15 at 1100  Please call the care guide team at [phone] if you need to cancel or reschedule your appointment.   If you are experiencing a Mental Health or [place] or need someone to talk to, please call the Suicide and Crisis Lifeline: 988 call the [place] National Suicide Prevention Lifeline: 317 432 7464 or TTY: 681 507 2796 TTY (775)184-4571) to talk to a trained counselor call 1-800-273-TALK (toll free, 24 hour hotline) call 911   Following is a copy of your full care plan:  Care Plan : [place] (Adult)  Updates made by Leona Singleton, RN since 09/09/2021 12:00 AM     Problem: KNOWLEDGE DEFICIT RELATED TO SELF CARE MANAGEMENT OF CHRONIC CONDITIONS   Priority: Medium     Long-Range Goal: Patient will work with CCM team to gain knowledge of chroic conditions   Start Date: 09/05/2021  Expected End Date: 09/05/2021  Priority: Medium  Note:   Current Barriers:  Knowledge Deficits related to plan of care for management of CHF and Left arm wound;   Patient with left arm wound after sepsis episode January 2023;   underwent skin grafting 2 weeks ago.  States he does his own dressing changes, declined home health and provider aware.  Denies any issues with ski graft area.  Weight 196 pounds.  BP 110-120/50-60's  RNCM Clinical Goal(s):  Patient will demonstrate improved health management independence as evidenced by daily weight monitoring and no sigs symptoms recurrent infection        through collaboration with RN Care manager, provider, and care team.   Interventions: 1:1 collaboration with primary care provider regarding development and update of comprehensive plan of care as evidenced by provider attestation and co-signature Inter-disciplinary care team collaboration (see longitudinal plan of care) Evaluation of current treatment plan related to  self management and patient's adherence to plan as established by provider   Heart Failure Interventions:  (Status: New goal.)  Long Term Goal  Wt Readings from Last 3 Encounters:  08/24/21 195 lb (88.5 kg)  08/23/21 195 lb (88.5 kg)  08/23/21 196 lb (88.9 kg)   Basic overview and discussion of pathophysiology of Heart Failure reviewed Provided education on low sodium diet Reviewed Heart Failure Action Plan in depth and provided written copy Advised patient to weigh each morning after emptying bladder Discussed importance of daily weight and advised patient to weigh and record daily Reviewed role of diuretics in prevention of fluid overload and management of heart failure Discussed the importance of keeping all appointments with provider Provided patient with education about the role of exercise in the management of heart failure Screening for signs and symptoms of depression  related to chronic disease state  Assessed social determinant of health barriers  Left Arm Wound  (Status: New goal.) Long Term Goal  Evaluation of current treatment plan related to  arm wound , Level of care concerns self-management and patient's adherence to plan as established  by provider. Discussed plans with patient for ongoing care management follow up and provided patient with direct contact information for care management team Provided education to patient re: monitoring wound for infection; Discussed plans with patient for ongoing care management follow up and provided patient with direct contact information for care management team; Verified patient comfortable doing own dressing changes and provider aware  Patient Goals/Self-Care Activities: Take medications as prescribed   Attend all scheduled provider appointments Perform all self care activities independently  Perform IADL's (shopping, preparing meals, housekeeping, managing finances) independently call office if I gain more than 2 pounds in one day or 5 pounds in one week use salt in moderation watch for swelling in feet, ankles and legs every day weigh myself daily bring diary to all appointments develop a rescue plan follow rescue plan if symptoms flare-up know when to call the doctorbased on weight, heart failure symptoms, wound assessment       Consent to CCM Services: Ian Mills was given information about Chronic Care Management services including:  CCM service includes personalized support from designated clinical staff supervised by his physician, including individualized plan of care and coordination with other care providers 24/7 contact phone numbers for assistance for urgent and routine care needs. Service will only be billed when office clinical staff spend 20 minutes or more in a month to coordinate care. Only one practitioner may furnish and bill the service in a calendar month. The patient may stop CCM services at any time (effective at the end of the month) by phone call to the office staff. The patient will be responsible for cost sharing (co-pay) of up to 20% of the service fee (after annual deductible is met).  Patient agreed to services and verbal consent obtained.   The  patient verbalized understanding of instructions, educational materials, and care plan provided today and agreed to receive a mailed copy of patient instructions, educational materials, and care plan.   The care management team will reach out to the patient again over the next 45 days.    Ian Azure RN, MSN RN Care Management Coordinator [place] [phone] [email or web address]

## 2021-09-09 NOTE — Chronic Care Management (AMB) (Signed)
Chronic Care Management   CCM RN Visit Note  09/09/2021 Name: Ian Mills MRN: [id number] DOB: [birth year]  Subjective: Ian Mills is a 66 y.o. year old male who is a primary care patient of McLean-Scocuzza, Nino Glow, MD. The care management team was consulted for assistance with disease management and care coordination needs.    Engaged with patient by telephone for initial visit in response to provider referral for case management and/or care coordination services.   Consent to Services:  The patient was given the following information about Chronic Care Management services today, agreed to services, and gave verbal consent: 1. CCM service includes personalized support from designated clinical staff supervised by the primary care provider, including individualized plan of care and coordination with other care providers 2. 24/7 contact phone numbers for assistance for urgent and routine care needs. 3. Service will only be billed when office clinical staff spend 20 minutes or more in a month to coordinate care. 4. Only one practitioner may furnish and bill the service in a calendar month. 5.The patient may stop CCM services at any time (effective at the end of the month) by phone call to the office staff. 6. The patient will be responsible for cost sharing (co-pay) of up to 20% of the service fee (after annual deductible is met). Patient agreed to services and consent obtained.  Patient agreed to services and verbal consent obtained.   Assessment: Review of patient past medical history, allergies, medications, health status, including review of consultants reports, laboratory and other test data, was performed as part of comprehensive evaluation and provision of chronic care management services.   SDOH (Social Determinants of Health) assessments and interventions performed:  SDOH Interventions    Flowsheet Row Most Recent Value  SDOH Interventions   Food Insecurity Interventions  Intervention Not Indicated  Financial Strain Interventions Other (Comment)  [CCM PHARMACIST ASSISTING]  Housing Interventions Intervention Not Indicated  Intimate Partner Violence Interventions Intervention Not Indicated  Stress Interventions Intervention Not Indicated  Transportation Interventions Intervention Not Indicated        CCM Care Plan  Allergies  Allergen Reactions   Nitroglycerin Nausea Only and Other (See Comments)    Per patient "causes severe  headache"    Outpatient Encounter Medications as of 09/05/2021  Medication Sig Note   acetaminophen (TYLENOL) 325 MG tablet Take 2 tablets (650 mg total) by mouth every 8 (eight) hours as needed for mild pain.    albuterol (VENTOLIN HFA) 108 (90 Base) MCG/ACT inhaler Inhale 1 puff into the lungs every 6 (six) hours as needed for wheezing or shortness of breath.    ALPRAZolam (XANAX) 0.5 MG tablet Take 1 tablet (0.5 mg total) by mouth 3 (three) times daily as needed. for anxiety    amiodarone (PACERONE) 200 MG tablet Take 1 tablet (200 mg total) by mouth daily.    aspirin EC 81 MG tablet Take 81 mg by mouth daily. Swallow whole.    atorvastatin (LIPITOR) 80 MG tablet Take 1 tablet (80 mg total) by mouth daily.    carvedilol (COREG) 3.125 MG tablet Take 1 tablet (3.125 mg total) by mouth 2 (two) times daily.    clopidogrel (PLAVIX) 75 MG tablet Take 75 mg by mouth daily.    ezetimibe (ZETIA) 10 MG tablet Take 1 tablet (10 mg total) by mouth daily.    ibuprofen (ADVIL) 800 MG tablet Take 1 tablet (800 mg total) by mouth every 8 (eight) hours as needed for mild pain or  moderate pain.    ipratropium-albuterol (DUONEB) 0.5-2.5 (3) MG/3ML SOLN Take 3 mLs by nebulization every 6 (six) hours as needed.    mupirocin ointment (BACTROBAN) 2 % Apply 1 application topically 2 (two) times daily.    potassium chloride (KLOR-CON 10) 10 MEQ tablet Take 1 tablet (10 mEq total) by mouth daily.    sacubitril-valsartan (ENTRESTO) 49-51 MG Take 1 tablet  by mouth 2 (two) times daily.    silver sulfADIAZINE (SILVADENE) 1 % cream Apply topically 2 (two) times daily.    tadalafil (CIALIS) 10 MG tablet Take 1-2 tablets (10-20 mg total) by mouth daily as needed for erectile dysfunction.    Tiotropium Bromide-Olodaterol (STIOLTO RESPIMAT) 2.5-2.5 MCG/ACT AERS Inhale 2 puffs into the lungs daily.    torsemide (DEMADEX) 20 MG tablet Take 1 tablet (20 mg total) by mouth daily.    amoxicillin (AMOXIL) 500 MG capsule Take 1 capsule (500 mg total) by mouth 3 (three) times daily. (Patient not taking: Reported on 09/05/2021)    oxyCODONE-acetaminophen (PERCOCET/ROXICET) 5-325 MG tablet Take 1 tablet by mouth 2 (two) times daily. (Patient not taking: Reported on 09/05/2021) 09/05/2021: completed   No facility-administered encounter medications on file as of 09/05/2021.    Patient Active Problem List   Diagnosis Date Noted   Thrombocytopenia ([place])    Sepsis due to group A Streptococcus with acute renal failure and septic shock (HCC)    Left arm cellulitis    Transaminitis    Acute kidney injury superimposed on CKD ([place])    Septic shock ([place]) 07/29/2021   Genetic testing 02/02/2021   Family history of lung cancer 01/23/2021   Personal history of colonic polyps 01/23/2021   History of prostate surgery 06/09/2020   Erectile dysfunction 06/09/2020   Healthcare maintenance 03/27/2020   Pain of left heel 03/03/2020   Obesity (BMI 30-39.9) 03/03/2020   Aortic atherosclerosis ([place]) 02/20/2020   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   Essential hypertension 10/13/2019   Unstable angina (HCC)    Carotid artery stenosis 06/10/2019   Baker's cyst 06/10/2019   Bilateral impacted cerumen 58/52/7782   Chronic systolic CHF (congestive heart failure) ([place]) 06/10/2019   Scoliosis 06/10/2019   Anxiety 04/15/2019   Chronic obstructive pulmonary disease ([place]) 04/15/2019   COPD exacerbation ([place]) 09/08/2018   Chest tightness 02/12/2018   Pain and  swelling of left lower extremity 02/12/2018   Sacroiliac pain 07/31/2016   Acute on chronic systolic CHF (congestive heart failure) ([place]) 06/27/2016   Hypokalemia 06/25/2016   NSTEMI (non-ST elevated myocardial infarction) ([place]) 06/23/2016   Acute exacerbation of CHF (congestive heart failure) ([place]) 06/22/2016   DDD (degenerative disc disease), lumbar 06/13/2016   Tobacco abuse 06/13/2016   Dizziness 03/30/2015   Former smoker 09/29/2014   Syncope 06/01/2014   Insomnia 06/01/2014   Ischemic cardiomyopathy    Ventricular tachycardia 01/23/2012   Single implantable cardioverter-defibrillator-BSx 12/11/2011   SYNCOPE AND COLLAPSE 09/26/2010   Hyperlipidemia 02/23/2010   Hypotension 02/23/2010   CAD (coronary artery disease) 02/23/2010    Conditions to be addressed/monitored:CHF and Arm Wound  Care Plan : [place] (Adult)  Updates made by Leona Singleton, RN since 09/09/2021 12:00 AM     Problem: KNOWLEDGE DEFICIT RELATED TO SELF CARE MANAGEMENT OF CHRONIC CONDITIONS   Priority: Medium     Long-Range Goal: Patient will work with CCM team to gain knowledge of chroic conditions   Start Date: 09/05/2021  Expected End Date: 09/05/2021  Priority: Medium  Note:   Current Barriers:  Knowledge Deficits related to plan of care for management of CHF and Left arm wound;   Patient with left arm wound after sepsis episode January 2023;  underwent skin grafting 2 weeks ago.  States he does his own dressing changes, declined home health and provider aware.  Denies any issues with ski graft area.  Weight 196 pounds.  BP 110-120/50-60's  RNCM Clinical Goal(s):  Patient will demonstrate improved health management independence as evidenced by daily weight monitoring and no sigs symptoms recurrent infection        through collaboration with RN Care manager, provider, and care team.   Interventions: 1:1 collaboration with primary care provider regarding development and update of  comprehensive plan of care as evidenced by provider attestation and co-signature Inter-disciplinary care team collaboration (see longitudinal plan of care) Evaluation of current treatment plan related to  self management and patient's adherence to plan as established by provider   Heart Failure Interventions:  (Status: New goal.)  Long Term Goal  Wt Readings from Last 3 Encounters:  08/24/21 195 lb (88.5 kg)  08/23/21 195 lb (88.5 kg)  08/23/21 196 lb (88.9 kg)   Basic overview and discussion of pathophysiology of Heart Failure reviewed Provided education on low sodium diet Reviewed Heart Failure Action Plan in depth and provided written copy Advised patient to weigh each morning after emptying bladder Discussed importance of daily weight and advised patient to weigh and record daily Reviewed role of diuretics in prevention of fluid overload and management of heart failure Discussed the importance of keeping all appointments with provider Provided patient with education about the role of exercise in the management of heart failure Screening for signs and symptoms of depression related to chronic disease state  Assessed social determinant of health barriers  Left Arm Wound  (Status: New goal.) Long Term Goal  Evaluation of current treatment plan related to  arm wound , Level of care concerns self-management and patient's adherence to plan as established by provider. Discussed plans with patient for ongoing care management follow up and provided patient with direct contact information for care management team Provided education to patient re: monitoring wound for infection; Discussed plans with patient for ongoing care management follow up and provided patient with direct contact information for care management team; Verified patient comfortable doing own dressing changes and provider aware  Patient Goals/Self-Care Activities: Take medications as prescribed   Attend all scheduled  provider appointments Perform all self care activities independently  Perform IADL's (shopping, preparing meals, housekeeping, managing finances) independently call office if I gain more than 2 pounds in one day or 5 pounds in one week use salt in moderation watch for swelling in feet, ankles and legs every day weigh myself daily bring diary to all appointments develop a rescue plan follow rescue plan if symptoms flare-up know when to call the doctorbased on weight, heart failure symptoms, wound assessment       Plan:The care management team will reach out to the patient again over the next 45 days.  Hubert Azure RN, MSN RN Care Management Coordinator [place] [phone] [email or web address]

## 2021-09-11 ENCOUNTER — Ambulatory Visit: Payer: Medicare Other

## 2021-09-12 DIAGNOSIS — S41102D Unspecified open wound of left upper arm, subsequent encounter: Secondary | ICD-10-CM | POA: Diagnosis not present

## 2021-09-18 ENCOUNTER — Other Ambulatory Visit: Payer: Self-pay

## 2021-09-18 ENCOUNTER — Encounter: Payer: Self-pay | Admitting: Internal Medicine

## 2021-09-18 ENCOUNTER — Ambulatory Visit (INDEPENDENT_AMBULATORY_CARE_PROVIDER_SITE_OTHER): Payer: Medicare Other | Admitting: Internal Medicine

## 2021-09-18 VITALS — BP 136/90 | HR 57 | Ht 69.0 in | Wt 200.0 lb

## 2021-09-18 DIAGNOSIS — I5022 Chronic systolic (congestive) heart failure: Secondary | ICD-10-CM

## 2021-09-18 DIAGNOSIS — I255 Ischemic cardiomyopathy: Secondary | ICD-10-CM

## 2021-09-18 DIAGNOSIS — Z95810 Presence of automatic (implantable) cardiac defibrillator: Secondary | ICD-10-CM | POA: Diagnosis not present

## 2021-09-18 DIAGNOSIS — I472 Ventricular tachycardia, unspecified: Secondary | ICD-10-CM

## 2021-09-18 DIAGNOSIS — Z79899 Other long term (current) drug therapy: Secondary | ICD-10-CM | POA: Diagnosis not present

## 2021-09-18 LAB — PACEMAKER DEVICE OBSERVATION

## 2021-09-18 MED ORDER — SPIRONOLACTONE 25 MG PO TABS: 12.5000 mg | ORAL_TABLET | Freq: Every day | ORAL | 6 refills | Status: DC

## 2021-09-18 NOTE — Patient Instructions (Addendum)
Medication Instructions:  - Your physician has recommended you make the following change in your medication:   1) START Spironolactone 25 mg: - take 0.5 tablet (12.5 mg) by mouth once daily   *If you need a refill on your cardiac medications before your next appointment, please call your pharmacy*   Lab Work: - Your physician recommends that you return for lab work in: 2 weeks- BMP/ TSH  If you have labs (blood work) drawn today and your tests are completely normal, you will receive your results only by: [place] (if you have MyChart) OR A paper copy in the mail If you have any lab test that is abnormal or we need to change your treatment, we will call you to review the results.   Testing/Procedures: - none ordered   Follow-Up: At [facility], you and your health needs are our priority.  As part of our continuing mission to provide you with exceptional heart care, we have created designated Provider Care Teams.  These Care Teams include your primary Cardiologist (physician) and Advanced Practice Providers (APPs -  Physician Assistants and Nurse Practitioners) who all work together to provide you with the care you need, when you need it.  We recommend signing up for the patient portal called "MyChart".  Sign up information is provided on this After Visit Summary.  MyChart is used to connect with patients for Virtual Visits (Telemedicine).  Patients are able to view lab/test results, encounter notes, upcoming appointments, etc.  Non-urgent messages can be sent to your provider as well.   To learn more about what you can do with MyChart, go to [email or web address].    Your next appointment:   6 month(s)  The format for your next appointment:   In Person  Provider:   Virl Axe, MD    Other Instructions  1) Call the [place] number given to you today for assistance with you remote transmissions   Spironolactone Tablets What is this  medication? SPIRONOLACTONE (speer on oh LAK tone) treats high blood pressure and heart failure. It may also be used to reduce swelling related to heart, kidney, or liver disease. It helps your kidneys remove more fluid and salt from your blood through the urine without losing too much potassium. It belongs to a group of medications called diuretics. This medicine may be used for other purposes; ask your health care provider or pharmacist if you have questions. COMMON BRAND NAME(S): Aldactone What should I tell my care team before I take this medication? They need to know if you have any of these conditions: Addison's disease or low adrenal gland function High blood level of potassium Kidney disease Liver disease An unusual or allergic reaction to spironolactone, other medications, foods, dyes, or preservatives Pregnant or trying to get pregnant Breast-feeding How should I use this medication? Take this medication by mouth. Take it as directed on the prescription label at the same time every day. You can take it with or without food. You should always take it the same way. Keep taking it unless your care team tells you to stop. Talk to your care team about the use of this medication in children. Special care may be needed. Overdosage: If you think you have taken too much of this medicine contact a poison control center or emergency room at once. NOTE: This medicine is only for you. Do not share this medicine with others. What if I miss a dose? If you miss a dose, take it as soon  as you can. If it is almost time for your next dose, take only that dose. Do not take double or extra doses. What may interact with this medication? Do not take this medication with any of the following: Cidofovir Eplerenone Tranylcypromine This medication may also interact with the following: Aspirin Certain medications for blood pressure or heart disease like benazepril, lisinopril, losartan, valsartan Certain  medications that treat or prevent blood clots like heparin and enoxaparin Cholestyramine Cyclosporine Digoxin Lithium Medications that relax muscles for surgery NSAIDs, medications for pain and inflammation, like ibuprofen or naproxen Other diuretics Potassium salts or supplements Steroid medications like prednisone or cortisone Trimethoprim This list may not describe all possible interactions. Give your health care provider a list of all the medicines, herbs, non-prescription drugs, or dietary supplements you use. Also tell them if you smoke, drink alcohol, or use illegal drugs. Some items may interact with your medicine. What should I watch for while using this medication? Visit your care team for regular checks on your progress. Check your blood pressure as directed. Ask your care team what your blood pressure should be. Also, find out when you should contact him or her. Do not treat yourself for coughs, colds, or pain while you are using this medication without asking your care team for advice. Some medications may increase your blood pressure. Check with your care team if you have severe diarrhea, nausea, and vomiting, or if you sweat a lot. The loss of too much body fluid may make it dangerous for you to take this medication. You may need to be on a special diet while taking this medication. Ask your care team. Also, find out how many glasses of fluid you need to drink each day. You may get drowsy or dizzy. Do not drive, use machinery, or do anything that needs mental alertness until you know how this medication affects you. Do not stand or sit up quickly, especially if you are an older patient. This reduces the risk of dizzy or fainting spells. Alcohol may interfere with the effects of this medication. Avoid alcoholic drinks. Avoid salt substitutes unless you are told otherwise by your care team. What side effects may I notice from receiving this medication? Side effects that you should  report to your care team as soon as possible: Allergic reactions--skin rash, itching, hives, swelling of the face, lips, tongue, or throat Dehydration--increased thirst, dry mouth, feeling faint or lightheaded, headache, dark yellow or brown urine High potassium level--muscle weakness, fast or irregular heartbeat Kidney injury--decrease in the amount of urine, swelling of the ankles, hands, or feet Low blood pressure--dizziness, feeling faint or lightheaded, blurry vision Low sodium level--muscle weakness, fatigue, dizziness, headache, confusion Side effects that usually do not require medical attention (report to your care team if they continue or are bothersome): Breast pain or tenderness Changes in sex drive or performance Dizziness Headache Irregular menstrual cycles or spotting Unexpected breast tissue growth This list may not describe all possible side effects. Call your doctor for medical advice about side effects. You may report side effects to FDA at 1-800-FDA-1088. Where should I keep my medication? Keep out of the reach of children and pets. Store below 25 degrees C (77 degrees F). Get rid of any unused medication after the expiration date. To get rid of medications that are no longer needed or have expired: Take the medication to a medication take-back program. Check with your pharmacy or law enforcement to find a location. If you cannot return the medication,  check the label or package insert to see if the medication should be thrown out in the garbage or flushed down the toilet. If you are not sure, ask your care team. If it is safe to put into the trash, take the medication out of the container. Mix the medication with cat litter, dirt, coffee grounds, or other unwanted substance. Seal the mixture in a bag or container. Put it in the trash. NOTE: This sheet is a summary. It may not cover all possible information. If you have questions about this medicine, talk to your doctor,  pharmacist, or health care provider.  2022 Elsevier/Gold Standard (2021-04-03 00:00:00)

## 2021-09-18 NOTE — Progress Notes (Signed)
Patient Care Team: McLean-Scocuzza, Nino Glow, MD as PCP - General (Internal Medicine) Minna Merritts, MD as PCP - Cardiology (Cardiology) Deboraha Sprang, MD as PCP - Electrophysiology (Cardiology) Minna Merritts, MD as Consulting Physician (Cardiology) De Hollingshead, RPH-CPP as Pharmacist (Pharmacist) Leona Singleton, RN as [place] Management   HPI  Ian Mills is a 66 y.o. male Seen in followup for ICD implanted May 2013 following presentation with ventricular tachycardia for which he takes amiodarone .    Ischemic heart disease with prior stenting of his LAD PDA and OM last in 2008.  Most recent catheterization showed total LAD.  1/23 the patient was admitted with streptococcal infection and toxic shock syndrome; treated with prolonged antibiotics.  Was not seen by cardiology during the hospitalization.  Did not undergo TEE.   The patient denies chest pain, shortness of breath, nocturnal dyspnea, orthopnea or peripheral edema.  There have been no palpitations, lightheadedness or syncope.  And overall feels exceedingly good  Patient denies symptoms of GI intolerance, sun sensitivity, neurological symptoms attributable to amiodarone.    Date Cr K TSH LFTs  8/17   1.4 16  5/18  1.05 3.9 2.08 (9/18)    7/19  1.4 3.9    1/23 1.08 4.1 2.66 (9/22) 33   DATE TEST EF   2008 CMRI 30-35 Anterolateral Scar w/o viability       11/15 Echo   25-30 %   1/21 Myoview  <30% Prior Scar-anterior Inferolateral   2/21 LHC  LADp/m-T;CXo-60; RCA 20-30  12/22 Echo  25-30%       Past Medical History:  Diagnosis Date   Acute anterior wall MI ([place]) 07/04/1991   a.) LHC: EF 29%; 100% mLAD -> PTCA performed   Anxiety    Aortic atherosclerosis (HCC)    CHF (congestive heart failure) (HCC)    COPD (chronic obstructive pulmonary disease) (HCC)    Coronary artery disease    a.) 3 stents --> 3x18 mm Duet to OM3, 2.5x18 mm Duet to D1, 2.5x13 mm Duet to RPDA. b.  06/2013 PCI: CTO LAD, patent LAD stent, RCA 70p (FFR 0.74--> DES), 57m (DES), RPDA 50%, EF 20%; c. 08/2019 Cath: LM nl, LAD 100p/m, D1 60, D2 patent stent, LCX 60ost/p, 24m ISR, RCA 30p, 79m ISR.   Family history of lung cancer    HFrEF (heart failure with reduced ejection fraction) ([place])    a. 05/2014 Echo: EF 25-30%; b. 01/2018 Echo ([facility]): EF 30%; c. 06/2021 Echo: EF 25-30%, apical AK, GrI DD, nl RV fxn.   HLD (hyperlipidemia)    HTN (hypertension)    ICD  single,BSX    a. DOI 11/2011; b. S/N# [id number]   Ischemic cardiomyopathy    a. 05/2014 Echo: EF 25-30%; b. 01/2018 Echo ([facility]): EF 30%, c. 06/2021 Echo: EF 25-30%.   Lateral wall myocardial infarction ([place]) 06/29/1997   a.) LHC: EF 44%; 75% RPDA and 95% OM3; PCI performed and a 3.0 x 18 mm Duet stent placed to [place] term current use of antithrombotics/antiplatelets    a.) DAPT therapy (ASA + clopidogrel)   NSTEMI (non-ST elevated myocardial infarction) ([place]) 11/27/2011   a.) LHC: 100% mLAD; intervention deferred opting for medical management.   Personal history of colonic polyps    Pneumonia 08/2018   Sepsis ([place]) 07/29/2021   Syncope and collapse    Ventricular tachycardia 12/01/2011   a.) VT arrest; ROSC achieved --Esparto placed.  Past Surgical History:  Procedure Laterality Date   APPENDECTOMY N/A    CARDIAC DEFIBRILLATOR PLACEMENT N/A 12/01/2011   COLONOSCOPY WITH PROPOFOL N/A 12/22/2020   Procedure: COLONOSCOPY WITH PROPOFOL;  Surgeon: Jonathon Bellows, MD;  Location: [facility] ENDOSCOPY;  Service: Gastroenterology;  Laterality: N/A;   CORONARY ANGIOPLASTY Left 07/05/1991   Procedure: CORONARY ANGIOPLASTY; Location: [facility]; Surgeon: Doristine Bosworth, MD   CORONARY ANGIOPLASTY Left 08/06/1991   Procedure: CORONARY ANGIOPLASTY; Location: [facility]; Surgeon: Ellouise Newer, MD   CORONARY ANGIOPLASTY Left 08/09/1991   Procedure: CORONARY ANGIOPLASTY; Location: [facility]; Surgeon: Ellouise Newer, MD   CORONARY ANGIOPLASTY Left  10/28/1991   Procedure: CORONARY ANGIOPLASTY; Location: [facility]; Surgeon: Ellouise Newer, MD   CORONARY ANGIOPLASTY Left 11/02/1991   Procedure: CORONARY ANGIOPLASTY; Location: [facility]; Surgeon: Ellouise Newer, MD   CORONARY ANGIOPLASTY Left 01/10/1992   Procedure: CORONARY ANGIOPLASTY; Location: [facility]; Surgeon: Carles Collet, MD   CORONARY ANGIOPLASTY Left 09/26/1993   Procedure: CORONARY ANGIOPLASTY; Location: [facility]; Surgeon: Melton Alar, MD   CORONARY ANGIOPLASTY Left 09/11/2006   Procedure: CORONARY ANGIOPLASTY; Location: [facility]   CORONARY ANGIOPLASTY Left 01/17/2010   Procedure: CORONARY ANGIOPLASTY; Location: [facility]; Surgeon: Wendee Beavers, MD   CORONARY ANGIOPLASTY Left 11/27/2011   Procedure: CORONARY ANGIOPLASTY; Location: [facility]; Surgeon: Derinda Sis, MD   CORONARY ANGIOPLASTY WITH STENT PLACEMENT Left 06/29/1997   Procedure: CORONARY ANGIOPLASTY WITH STENT PLACEMENT (3.0 x 18 mm Diet stent to OM3); Location: [facility]; Surgeon: Allie Bossier, MD   CORONARY ANGIOPLASTY WITH STENT PLACEMENT Left 07/12/1998   Procedure: CORONARY ANGIOPLASTY WITH STENT PLACEMENT (2.8 x 8 mm Duet stent to D1, 2.5 x 13 mm Duet stent to RPDA); Location: [facility]   GRAFT APPLICATION Left 1/96/2229   Procedure: GRAFT APPLICATION;  Surgeon: Benjamine Sprague, DO;  Location: [facility] ORS;  Service: General;  Laterality: Left;   LEFT HEART CATH AND CORONARY ANGIOGRAPHY Left 08/30/2019   Procedure: LEFT HEART CATH AND CORONARY ANGIOGRAPHY;  Surgeon: Wellington Hampshire, MD;  Location: [place] CV LAB;  Service: Cardiovascular;  Laterality: Left;   PROSTATE SURGERY     urethra grew around prostate    TUMOR REMOVAL  2001   chest thymic cyst; behind lungs [facility] benign     Current Outpatient Medications  Medication Sig Dispense Refill   albuterol (VENTOLIN HFA) 108 (90 Base) MCG/ACT inhaler Inhale 1 puff into the lungs every 6 (six) hours as needed for wheezing or shortness of breath.     ALPRAZolam (XANAX) 0.5 MG tablet Take 1 tablet (0.5  mg total) by mouth 3 (three) times daily as needed. for anxiety 90 tablet 5   amiodarone (PACERONE) 200 MG tablet Take 1 tablet (200 mg total) by mouth daily. 90 tablet 0   aspirin EC 81 MG tablet Take 81 mg by mouth daily. Swallow whole.     atorvastatin (LIPITOR) 80 MG tablet Take 1 tablet (80 mg total) by mouth daily. 90 tablet 3   carvedilol (COREG) 3.125 MG tablet Take 1 tablet (3.125 mg total) by mouth 2 (two) times daily. 180 tablet 3   clopidogrel (PLAVIX) 75 MG tablet Take 75 mg by mouth daily.     ezetimibe (ZETIA) 10 MG tablet Take 1 tablet (10 mg total) by mouth daily. 90 tablet 3   ipratropium-albuterol (DUONEB) 0.5-2.5 (3) MG/3ML SOLN Take 3 mLs by nebulization every 6 (six) hours as needed. 360 mL 11   potassium chloride (KLOR-CON 10) 10 MEQ tablet Take 1 tablet (10 mEq total) by mouth daily. 90 tablet 3   sacubitril-valsartan (ENTRESTO) 49-51 MG  Take 1 tablet by mouth 2 (two) times daily. 180 tablet 3   tadalafil (CIALIS) 10 MG tablet Take 1-2 tablets (10-20 mg total) by mouth daily as needed for erectile dysfunction. 30 tablet 11   Tiotropium Bromide-Olodaterol (STIOLTO RESPIMAT) 2.5-2.5 MCG/ACT AERS Inhale 2 puffs into the lungs daily. 4 g 0   torsemide (DEMADEX) 20 MG tablet Take 1 tablet (20 mg total) by mouth daily. 90 tablet 3   silver sulfADIAZINE (SILVADENE) 1 % cream Apply topically 2 (two) times daily. (Patient not taking: Reported on 09/18/2021) 50 g 2   No current facility-administered medications for this visit.    Allergies  Allergen Reactions   Nitroglycerin Nausea Only and Other (See Comments)    Per patient "causes severe  headache"    Review of Systems negative except from HPI and PMH  Physical Exam BP 136/90 (BP Location: Right Arm, Patient Position: Sitting, Cuff Size: Normal)    Pulse (!) 57    Ht 5\' 9"  (1.753 m)    Wt 200 lb (90.7 kg)    SpO2 98%    BMI 29.53 kg/m  Well developed and well nourished in no acute distress HENT normal Neck supple with  JVP-6-8 cm Clear Device pocket well healed; without hematoma or erythema.  There is no tethering  Regular rate and rhythm, no  gallop No murmur Abd-soft with active BS No Clubbing cyanosis  edema Skin-warm and dry A & Oriented  Grossly normal sensory and motor function  ECG sinus at 57 Intervals 19/10/44 Q waves V1-V6 Left axis deviation  Assessment and  Plan  Ventricular tachycardia  Ischemic cardiomyopathy prior inferior wall and anterior wall MI  Congestive heart failure-chronic-systolic  Hypertension  Hyperlipidemia  Implantable defibrillator-. [employer]      Sinus bradycardia  Blood pressure is somewhat elevated.  With his cardiomyopathy, we will not increase his carvedilol but continue it at 3.125 because of his resting bradycardia and that currently while not ventricularly pacing about 30% of his beats are less than 60 bpm.  Continue his Entresto 49/51.  We will add spironolactone at 12.5 mg a day and check a metabolic profile in 2 weeks time. No intercurrent ventricular tachycardia.  We will continue the carvedilol and the amiodarone.  Continue to 200 mg a day.  We will check his TSH at the same time.  He is euvolemic.  We will continue him on his torsemide at 20 mg a day with potassium repletion  We will explore with [facility] [employer] as to whether the magnet inhibition would have been associated with the ability to record the cautery events.

## 2021-09-19 ENCOUNTER — Telehealth: Payer: Self-pay | Admitting: Internal Medicine

## 2021-09-19 NOTE — Telephone Encounter (Signed)
Patient calling  States he was told he had an episode on 09/24/21  Patient would like to know what time it was Please call to discuss

## 2021-09-19 NOTE — Telephone Encounter (Signed)
LVM, patient needs to send a manual transmission from his home monitor which has not connected since 09/11/21 direct number to device clinic given.

## 2021-09-19 NOTE — Telephone Encounter (Signed)
Spoke with patient's wife.  Advised that the episode on 08/24/21 has been confirmed to have occurred during his dermatological grafting procedure.  Dr. Caryl Comes is reviewing with anesthesia dept to determine why episode occurred in his device.    The time of the episode was 10:40am according to the device, however the time clock on the device needed to be reset so it was off by 1-2 hours.

## 2021-09-20 ENCOUNTER — Other Ambulatory Visit: Payer: Self-pay | Admitting: Internal Medicine

## 2021-09-20 NOTE — Telephone Encounter (Signed)
This is a [place] pt 

## 2021-09-25 DIAGNOSIS — I10 Essential (primary) hypertension: Secondary | ICD-10-CM

## 2021-09-25 DIAGNOSIS — I5042 Chronic combined systolic (congestive) and diastolic (congestive) heart failure: Secondary | ICD-10-CM

## 2021-10-02 ENCOUNTER — Ambulatory Visit: Payer: Self-pay | Admitting: Pharmacist

## 2021-10-02 NOTE — Chronic Care Management (AMB) (Signed)
?  Chronic Care Management  ? ?Note ? ?10/02/2021 ?Name: NIMROD WENDT MRN: [id number] DOB: Sep 11, 1955 ? ? ? ?Closing pharmacy CCM case at this time. Follow up with RN CM. Patient has clinic contact information for future questions or concerns.  ? ?Catie Darnelle Maffucci, PharmD, [place], CPP ?Clinical Pharmacist ?Therapist, music at [employer] ?[phone] ? ?

## 2021-10-03 ENCOUNTER — Other Ambulatory Visit (INDEPENDENT_AMBULATORY_CARE_PROVIDER_SITE_OTHER): Payer: Medicare Other

## 2021-10-03 ENCOUNTER — Other Ambulatory Visit: Payer: Self-pay

## 2021-10-03 DIAGNOSIS — Z79899 Other long term (current) drug therapy: Secondary | ICD-10-CM | POA: Diagnosis not present

## 2021-10-03 DIAGNOSIS — I255 Ischemic cardiomyopathy: Secondary | ICD-10-CM | POA: Diagnosis not present

## 2021-10-03 DIAGNOSIS — I472 Ventricular tachycardia, unspecified: Secondary | ICD-10-CM | POA: Diagnosis not present

## 2021-10-03 DIAGNOSIS — I5022 Chronic systolic (congestive) heart failure: Secondary | ICD-10-CM | POA: Diagnosis not present

## 2021-10-04 LAB — TSH: TSH: 2.32 u[IU]/mL (ref 0.450–4.500)

## 2021-10-04 LAB — BASIC METABOLIC PANEL
BUN/Creatinine Ratio: 19 (ref 10–24)
BUN: 25 mg/dL (ref 8–27)
CO2: 18 mmol/L — ABNORMAL LOW (ref 20–29)
Calcium: 9.4 mg/dL (ref 8.6–10.2)
Chloride: 105 mmol/L (ref 96–106)
Creatinine, Ser: 1.31 mg/dL — ABNORMAL HIGH (ref 0.76–1.27)
Glucose: 103 mg/dL — ABNORMAL HIGH (ref 70–99)
Potassium: 4.3 mmol/L (ref 3.5–5.2)
Sodium: 142 mmol/L (ref 134–144)
eGFR: 59 mL/min/{1.73_m2} — ABNORMAL LOW (ref >59)

## 2021-10-05 ENCOUNTER — Other Ambulatory Visit: Payer: Medicare Other

## 2021-10-10 ENCOUNTER — Telehealth: Payer: Self-pay | Admitting: *Deleted

## 2021-10-10 ENCOUNTER — Ambulatory Visit: Payer: Medicare Other | Admitting: *Deleted

## 2021-10-10 DIAGNOSIS — I10 Essential (primary) hypertension: Secondary | ICD-10-CM

## 2021-10-10 DIAGNOSIS — T148XXA Other injury of unspecified body region, initial encounter: Secondary | ICD-10-CM

## 2021-10-10 DIAGNOSIS — S41102D Unspecified open wound of left upper arm, subsequent encounter: Secondary | ICD-10-CM | POA: Diagnosis not present

## 2021-10-10 DIAGNOSIS — I5042 Chronic combined systolic (congestive) and diastolic (congestive) heart failure: Secondary | ICD-10-CM

## 2021-10-10 NOTE — Chronic Care Management (AMB) (Signed)
?  Care Management  ? ?Follow Up Note ? ? ?10/10/2021 ?Name: Ian Mills MRN: [id number] DOB: May 11, 1956 ? ? ?Referred by: McLean-Scocuzza, Nino Glow, MD ?Reason for referral : Chronic Care Management (CHF, HTN) ? ? ?RECEIVED TELEPHONE CALL BACK  FROM PATIENT.  HE IS OUT ON THE ROAD AND REQUEST TO RESCHEDULE APPOINTMENT. ? ?Follow Up Plan: The care management team will reach out to the patient again over the next 30 days.  ? ?Hubert Azure RN, MSN ?RN Care Management Coordinator ?[place] ?[phone] ?[email or web address] ? ? ?

## 2021-10-10 NOTE — Telephone Encounter (Signed)
?  Care Management  ? ?Follow Up Note ? ? ?10/10/2021 ?Name: Ian Mills MRN: [id number] DOB: 1956-04-20 ? ? ?Referred by: McLean-Scocuzza, Nino Glow, MD ?Reason for referral : Chronic Care Management (CHF, HTN) ? ? ?An unsuccessful telephone outreach was attempted today. The patient was referred to the case management team for assistance with care management and care coordination.   Received telephone call back from patient, requesting to reschedule appointment. ? ?Follow Up Plan: The care management team will reach out to the patient again over the next 30 days.  ? ?Hubert Azure RN, MSN ?RN Care Management Coordinator ?[place] ?[phone] ?[email or web address] ? ?

## 2021-10-21 NOTE — Progress Notes (Signed)
?  ?  ? ? ? ?Date:  10/22/2021  ? ?ID:  Ian Mills, DOB Jul 13, 1956, MRN [id number] ? ?Patient Location:  ?[place] ?[place] [address]  ? ?Provider location:   ?[place], [employer] office ? ?PCP:  McLean-Scocuzza, Nino Glow, MD  ?Cardiologist:  [facility] ? ? ?Chief Complaint  ?Patient presents with  ? 6 month follow up   ?  "Doing well." Medications reviewed by the patient verbally.   ? ? ?History of Present Illness:   ? ?Ian Mills is a 66 y.o. male past medical history of ?Smoking, continues to smoke 1 pack a week ?coronary artery disease,  ?3 stents placed to his mid LAD, one stent to the PDA, one stent to the OM 3 in 08/2006  ?chest pressure during his divorce with stress test at [facility],  ?cardiac catheterization at [facility], 11/2011 ?syncope x4 episodes total over the past several years ?ECHO EF 25 to 30%, 2015 ?ICD, ejection fraction 30-35%, ?chronic back and hip pain ?ICD, ischemic cardiomyopathy ?He presents today for follow-up of his coronary artery disease,  Heart catheterization last month ? ?In hospital cellulitis, sepsis left arm ?Feels he cut his arm on the barn[place] off his boat ?Had surgical debridement, stitches placed ?Has f/u with surgery today ?Stitches out already ? ?In the setting of hypotension/sepsis, many of his cardiac medicines were held. ?Spironolactone held, Entresto dose decreased ?Taking torsemide 20 mg daily ?Weight is started trending upwards, concerned about fluid retention ? ?EKG personally reviewed by myself on todays visit ?Normal sinus rhythm rate 60 bpm consider old anterior MI ? ?catheterization early 2021 ?1. Significant underlying three-vessel coronary artery disease with chronically occluded mid LAD, patent first diagonal stent, patent RCA stents with mild in-stent restenosis, patent left circumflex stent with moderate in-stent restenosis.  Progression of ostial left circumflex stenosis to 60%. ?2.  Left ventricular angiography  was not performed due to chronic kidney disease. ?3.  Moderately elevated left ventricular end-diastolic pressure between 25 to 30 mmHg. ?No  revascularization recommended ? ?Quit smoking 08/2020 ? ?Stress test August 10, 2019 showing fixed defects anterior wall ?  ?Seen in the hospital February 2020 for COPD exacerbation ?PNA ?  ?Previously with back and sciatic pain, improved with cortisone shots, two years ago ?seen by [facility] for chronic back pain ? ? ?Past Medical History:  ?Diagnosis Date  ? Acute anterior wall MI ([place]) 07/04/1991  ? a.) LHC: EF 29%; 100% mLAD -> PTCA performed  ? Anxiety   ? Aortic atherosclerosis ([place])   ? CHF (congestive heart failure) ([place])   ? COPD (chronic obstructive pulmonary disease) ([place])   ? Coronary artery disease   ? a.) 3 stents --> 3x18 mm Duet to OM3, 2.5x18 mm Duet to D1, 2.5x13 mm Duet to RPDA. b. 06/2013 PCI: CTO LAD, patent LAD stent, RCA 70p (FFR 0.74--> DES), 65m(DES), RPDA 50%, EF 20%; c. 08/2019 Cath: LM nl, LAD 100p/m, D1 60, D2 patent stent, LCX 60ost/p, 453mSR, RCA 30p, 2047mR.  ? Family history of lung cancer   ? HFrEF (heart failure with reduced ejection fraction) ([place] ? a. 05/2014 Echo: EF 25-30%; b. 01/2018 Echo ([facility]): EF 30%; c. 06/2021 Echo: EF 25-30%, apical AK, GrI DD, nl RV fxn.  ? HLD (hyperlipidemia)   ? HTN (hypertension)   ? ICD  single,BSX   ? a. DOI 11/2011; b. S/N# [id number] Ischemic cardiomyopathy   ?  a. 05/2014 Echo: EF 25-30%; b. 01/2018 Echo ([facility]): EF 30%, c. 06/2021 Echo: EF 25-30%.  ? Lateral wall myocardial infarction ([place]) 06/29/1997  ? a.) LHC: EF 44%; 75% RPDA and 95% OM3; PCI performed and a 3.0 x 18 mm Duet stent placed to OM3  ? Long term current use of antithrombotics/antiplatelets   ? a.) DAPT therapy (ASA + clopidogrel)  ? NSTEMI (non-ST elevated myocardial infarction) ([place]) 11/27/2011  ? a.) LHC: 100% mLAD; intervention deferred opting for medical management.  ? Personal history of colonic polyps   ? Pneumonia 08/2018   ? Sepsis ([place]) 07/29/2021  ? Syncope and collapse   ? Ventricular tachycardia 12/01/2011  ? a.) VT arrest; ROSC achieved --[place] placed.  ? ?Past Surgical History:  ?Procedure Laterality Date  ? APPENDECTOMY N/A   ? CARDIAC DEFIBRILLATOR PLACEMENT N/A 12/01/2011  ? COLONOSCOPY WITH PROPOFOL N/A 12/22/2020  ? Procedure: COLONOSCOPY WITH PROPOFOL;  Surgeon: Jonathon Bellows, MD;  Location[facility] ENDOSCOPY;  Service: Gastroenterology;  Laterality: N/A;  ? CORONARY ANGIOPLASTY Left 07/05/1991  ? Procedure: CORONARY ANGIOPLASTY; Location: [facility]; Surgeon: Doristine Bosworth, MD  ? CORONARY ANGIOPLASTY Left 08/06/1991  ? Procedure: CORONARY ANGIOPLASTY; Location: [facility]; Surgeon: Ellouise Newer, MD  ? CORONARY ANGIOPLASTY Left 08/09/1991  ? Procedure: CORONARY ANGIOPLASTY; Location: [facility]; Surgeon: Ellouise Newer, MD  ? CORONARY ANGIOPLASTY Left 10/28/1991  ? Procedure: CORONARY ANGIOPLASTY; Location: [facility]; Surgeon: Ellouise Newer, MD  ? CORONARY ANGIOPLASTY Left 11/02/1991  ? Procedure: CORONARY ANGIOPLASTY; Location: [facility]; Surgeon: Ellouise Newer, MD  ? CORONARY ANGIOPLASTY Left 01/10/1992  ? Procedure: CORONARY ANGIOPLASTY; Location: [facility]; Surgeon: Carles Collet, MD  ? CORONARY ANGIOPLASTY Left 09/26/1993  ? Procedure: CORONARY ANGIOPLASTY; Location: [facility]; Surgeon: Melton Alar, MD  ? CORONARY ANGIOPLASTY Left 09/11/2006  ? Procedure: CORONARY ANGIOPLASTY; Location: [facility]  ? CORONARY ANGIOPLASTY Left 01/17/2010  ? Procedure: CORONARY ANGIOPLASTY; Location: [facility]; Surgeon: Wendee Beavers, MD  ? CORONARY ANGIOPLASTY Left 11/27/2011  ? Procedure: CORONARY ANGIOPLASTY; Location: [facility]; Surgeon: Derinda Sis, MD  ? CORONARY ANGIOPLASTY WITH STENT PLACEMENT Left 06/29/1997  ? Procedure: CORONARY ANGIOPLASTY WITH STENT PLACEMENT (3.0 x 18 mm Diet stent to OM3); Location: [facility]; Surgeon: Allie Bossier, MD  ? CORONARY ANGIOPLASTY WITH STENT PLACEMENT Left 07/12/1998  ? Procedure: CORONARY ANGIOPLASTY WITH STENT PLACEMENT (2.8 x 8 mm  Duet stent to D1, 2.5 x 13 mm Duet stent to RPDA); Location: [facility]  ? GRAFT APPLICATION Left 8/46/9629  ? Procedure: GRAFT APPLICATION;  Surgeon: Benjamine Sprague, DO;  Location: [facility] ORS;  Service: General;  Laterality: Left;  ? LEFT HEART CATH AND CORONARY ANGIOGRAPHY Left 08/30/2019  ? Procedure: LEFT HEART CATH AND CORONARY ANGIOGRAPHY;  Surgeon: Wellington Hampshire, MD;  Location: [facility];  Service: Cardiovascular;  Laterality: Left;  ? PROSTATE SURGERY    ? urethra grew around prostate   ? TUMOR REMOVAL  2001  ? chest thymic cyst; behind lungs [facility] benign   ?  ? ? ?Allergies:   Nitroglycerin  ? ?Social History  ? ?Tobacco Use  ? Smoking status: Former  ?  Packs/day: 0.50  ?  Years: 42.00  ?  Pack years: 21.00  ?  Types: Cigarettes  ?  Quit date: 11/10/2019  ?  Years since quitting: 1.9  ? Smokeless tobacco: Never  ?Vaping Use  ? Vaping Use: Never used  ?Substance Use Topics  ? Alcohol use: Yes  ?  Alcohol/week: 8.0 standard drinks  ?  Types: 8 Cans of beer per week  ?  Comment: per week  ? Drug use: No  ?  ? ?Current Outpatient Medications on File Prior to Visit  ?Medication Sig Dispense Refill  ? albuterol (VENTOLIN HFA) 108 (90 Base) MCG/ACT inhaler Inhale 1 puff into the lungs every 6 (six) hours as needed for wheezing or shortness of breath.    ? ALPRAZolam (XANAX) 0.5 MG tablet Take 1 tablet (0.5 mg total) by mouth 3 (three) times daily as needed. for anxiety 90 tablet 5  ? amiodarone (PACERONE) 200 MG tablet Take 1 tablet by mouth once daily 90 tablet 1  ? aspirin EC 81 MG tablet Take 81 mg by mouth daily. Swallow whole.    ? atorvastatin (LIPITOR) 80 MG tablet Take 1 tablet (80 mg total) by mouth daily. 90 tablet 3  ? carvedilol (COREG) 3.125 MG tablet Take 1 tablet (3.125 mg total) by mouth 2 (two) times daily. 180 tablet 3  ? clopidogrel (PLAVIX) 75 MG tablet Take 75 mg by mouth daily.    ? ezetimibe (ZETIA) 10 MG tablet Take 1 tablet (10 mg total) by mouth daily. 90 tablet 3  ?  ipratropium-albuterol (DUONEB) 0.5-2.5 (3) MG/3ML SOLN Take 3 mLs by nebulization every 6 (six) hours as needed. 360 mL 11  ? potassium chloride (KLOR-CON 10) 10 MEQ tablet Take 1 tablet (10 mEq total) by mouth daily. [place]

## 2021-10-22 ENCOUNTER — Other Ambulatory Visit: Payer: Self-pay

## 2021-10-22 ENCOUNTER — Encounter: Payer: Self-pay | Admitting: Cardiovascular Disease

## 2021-10-22 ENCOUNTER — Ambulatory Visit (INDEPENDENT_AMBULATORY_CARE_PROVIDER_SITE_OTHER): Payer: Medicare Other | Admitting: Cardiovascular Disease

## 2021-10-22 VITALS — BP 120/70 | HR 60 | Ht 67.0 in | Wt 207.1 lb

## 2021-10-22 DIAGNOSIS — I255 Ischemic cardiomyopathy: Secondary | ICD-10-CM

## 2021-10-22 DIAGNOSIS — I472 Ventricular tachycardia, unspecified: Secondary | ICD-10-CM

## 2021-10-22 DIAGNOSIS — L929 Granulomatous disorder of the skin and subcutaneous tissue, unspecified: Secondary | ICD-10-CM | POA: Diagnosis not present

## 2021-10-22 DIAGNOSIS — I5022 Chronic systolic (congestive) heart failure: Secondary | ICD-10-CM | POA: Diagnosis not present

## 2021-10-22 DIAGNOSIS — I700 Atherosclerosis of aorta: Secondary | ICD-10-CM | POA: Diagnosis not present

## 2021-10-22 DIAGNOSIS — I10 Essential (primary) hypertension: Secondary | ICD-10-CM | POA: Diagnosis not present

## 2021-10-22 DIAGNOSIS — Z95810 Presence of automatic (implantable) cardiac defibrillator: Secondary | ICD-10-CM

## 2021-10-22 DIAGNOSIS — I25118 Atherosclerotic heart disease of native coronary artery with other forms of angina pectoris: Secondary | ICD-10-CM

## 2021-10-22 DIAGNOSIS — S41102D Unspecified open wound of left upper arm, subsequent encounter: Secondary | ICD-10-CM | POA: Diagnosis not present

## 2021-10-22 MED ORDER — SPIRONOLACTONE 25 MG PO TABS: 12.5000 mg | ORAL_TABLET | Freq: Every day | ORAL | 1 refills | Status: DC

## 2021-10-22 MED ORDER — SACUBITRIL-VALSARTAN 97-103 MG PO TABS: 1.0000 | ORAL_TABLET | Freq: Two times a day (BID) | ORAL | 6 refills | Status: DC

## 2021-10-22 MED ORDER — TORSEMIDE 20 MG PO TABS: ORAL_TABLET | ORAL | 1 refills | Status: DC

## 2021-10-22 NOTE — Patient Instructions (Addendum)
[facility] #: [id number] ? ?Medication Instructions:  ?- Your physician has recommended you make the following change in your medication:  ? ?1) RESTART Spironolactone 25 mg: ?- take 0.5 tablet (12.5 mg) once daily  ? ?2) INCREASE entresto back to 97/103 mg: ?- take 1 tablet by mouth twice a day (or every 12 hours)  ? ?3) START Torsemide 20 mg: ?- take 1 tablet by mouth once daily,  ?- you may take 1 extra tablet (20 mg) once daily for any weight gain, shortness of breath , Abdominal bloating ? ? ?If you need a refill on your cardiac medications before your next appointment, please call your pharmacy.  ? ? ?Lab work: ?No new labs needed ? ? ?Testing/Procedures: ?No new testing needed ? ?Follow-Up: ?At [facility], you and your health needs are our priority.  As part of our continuing mission to provide you with exceptional heart care, we have created designated Provider Care Teams.  These Care Teams include your primary Cardiologist (physician) and Advanced Practice Providers (APPs -  Physician Assistants and Nurse Practitioners) who all work together to provide you with the care you need, when you need it. ? ?You will need a follow up appointment in 6 months ? ?Providers on your designated Care Team:   ?Murray Hodgkins, NP ?Christell Faith, PA-C ?Cadence Kathlen Mody, PA-C ? ?COVID-19 Vaccine Information can be found at: [email or web address] For questions related to vaccine distribution or appointments, please email [email or web address] or call [phone].  ?Spironolacti ?

## 2021-10-23 LAB — CUP PACEART REMOTE DEVICE CHECK
Battery Remaining Longevity: 42 mo
Battery Remaining Percentage: 43 %
Brady Statistic RV Percent Paced: 0 %
Date Time Interrogation Session: 20230216120200
HighPow Impedance: 83 Ohm
Implantable Lead Implant Date: 20130503
Implantable Lead Location: 753860
Implantable Lead Model: 292
Implantable Lead Serial Number: 112568
Implantable Pulse Generator Implant Date: 20130503
Lead Channel Impedance Value: 560 Ohm
Lead Channel Pacing Threshold Amplitude: 0.7 V
Lead Channel Pacing Threshold Pulse Width: 0.5 ms
Lead Channel Setting Pacing Amplitude: 2.4 V
Lead Channel Setting Pacing Pulse Width: 0.5 ms
Lead Channel Setting Sensing Sensitivity: 0.6 mV
Pulse Gen Serial Number: 105232

## 2021-10-26 ENCOUNTER — Ambulatory Visit (INDEPENDENT_AMBULATORY_CARE_PROVIDER_SITE_OTHER): Payer: Medicare Other | Admitting: *Deleted

## 2021-10-26 DIAGNOSIS — Z20822 Contact with and (suspected) exposure to covid-19: Secondary | ICD-10-CM | POA: Diagnosis not present

## 2021-10-26 DIAGNOSIS — I5042 Chronic combined systolic (congestive) and diastolic (congestive) heart failure: Secondary | ICD-10-CM

## 2021-10-26 DIAGNOSIS — I10 Essential (primary) hypertension: Secondary | ICD-10-CM

## 2021-10-26 DIAGNOSIS — T148XXA Other injury of unspecified body region, initial encounter: Secondary | ICD-10-CM

## 2021-10-26 NOTE — Chronic Care Management (AMB) (Signed)
?Chronic Care Management  ? ?CCM RN Visit Note ? ?10/26/2021 ?Name: CARI VANDEBERG MRN: [id number] DOB: 1955-12-21 ? ?Subjective: ?DODD SCHMID is a 66 y.o. year old male who is a primary care patient of McLean-Scocuzza, Nino Glow, MD. The care management team was consulted for assistance with disease management and care coordination needs.   ? ?Engaged with patient by telephone for follow up visit in response to provider referral for case management and/or care coordination services.  ? ?Consent to Services:  ?The patient was given information about Chronic Care Management services, agreed to services, and gave verbal consent prior to initiation of services.  Please see initial visit note for detailed documentation.  ? ?Patient agreed to services and verbal consent obtained.  ? ?Assessment: Review of patient past medical history, allergies, medications, health status, including review of consultants reports, laboratory and other test data, was performed as part of comprehensive evaluation and provision of chronic care management services.  ? ?SDOH (Social Determinants of Health) assessments and interventions performed:   ? ?CCM Care Plan ? ?Allergies  ?Allergen Reactions  ? Nitroglycerin Nausea Only and Other (See Comments)  ?  Per patient "causes severe  headache"  ? ? ?Outpatient Encounter Medications as of 10/26/2021  ?Medication Sig  ? sacubitril-valsartan (ENTRESTO) 97-103 MG Take 1 tablet by mouth 2 (two) times daily.  ? spironolactone (ALDACTONE) 25 MG tablet Take 0.5 tablets (12.5 mg total) by mouth daily.  ? torsemide (DEMADEX) 20 MG tablet Take 1 tablet (20 mg) by mouth once daily. You may take 1 extra tablet (20 mg) by mouth once daily as needed for swelling/ weight gain/ shortness of breath  ? albuterol (VENTOLIN HFA) 108 (90 Base) MCG/ACT inhaler Inhale 1 puff into the lungs every 6 (six) hours as needed for wheezing or shortness of breath.  ? ALPRAZolam (XANAX) 0.5 MG tablet Take 1 tablet (0.5 mg total)  by mouth 3 (three) times daily as needed. for anxiety  ? amiodarone (PACERONE) 200 MG tablet Take 1 tablet by mouth once daily  ? aspirin EC 81 MG tablet Take 81 mg by mouth daily. Swallow whole.  ? atorvastatin (LIPITOR) 80 MG tablet Take 1 tablet (80 mg total) by mouth daily.  ? carvedilol (COREG) 3.125 MG tablet Take 1 tablet (3.125 mg total) by mouth 2 (two) times daily.  ? clopidogrel (PLAVIX) 75 MG tablet Take 75 mg by mouth daily.  ? ezetimibe (ZETIA) 10 MG tablet Take 1 tablet (10 mg total) by mouth daily.  ? ipratropium-albuterol (DUONEB) 0.5-2.5 (3) MG/3ML SOLN Take 3 mLs by nebulization every 6 (six) hours as needed.  ? potassium chloride (KLOR-CON 10) 10 MEQ tablet Take 1 tablet (10 mEq total) by mouth daily.  ? silver sulfADIAZINE (SILVADENE) 1 % cream Apply topically 2 (two) times daily. (Patient not taking: Reported on 09/18/2021)  ? tadalafil (CIALIS) 10 MG tablet Take 1-2 tablets (10-20 mg total) by mouth daily as needed for erectile dysfunction.  ? Tiotropium Bromide-Olodaterol (STIOLTO RESPIMAT) 2.5-2.5 MCG/ACT AERS Inhale 2 puffs into the lungs daily.  ? ?No facility-administered encounter medications on file as of 10/26/2021.  ? ? ?Patient Active Problem List  ? Diagnosis Date Noted  ? Thrombocytopenia ([place])   ? Sepsis due to group A Streptococcus with acute renal failure and septic shock ([place])   ? Left arm cellulitis   ? Transaminitis   ? Acute kidney injury superimposed on CKD ([place])   ? Septic shock ([place]) 07/29/2021  ? Genetic testing 02/02/2021  ?  Family history of lung cancer 01/23/2021  ? Personal history of colonic polyps 01/23/2021  ? History of prostate surgery 06/09/2020  ? Erectile dysfunction 06/09/2020  ? Healthcare maintenance 03/27/2020  ? Pain of left heel 03/03/2020  ? Obesity (BMI 30-39.9) 03/03/2020  ? Aortic atherosclerosis ([place]) 02/20/2020  ? 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 Essential hypertension 10/13/2019  ? Unstable angina ([facility])   ? Carotid  artery stenosis 06/10/2019  ? Baker's cyst 06/10/2019  ? Bilateral impacted cerumen 06/10/2019  ? Chronic systolic CHF (congestive heart failure) ([place]) 06/10/2019  ? Scoliosis 06/10/2019  ? Anxiety 04/15/2019  ? Chronic obstructive pulmonary disease ([place]) 04/15/2019  ? COPD exacerbation ([place]) 09/08/2018  ? Chest tightness 02/12/2018  ? Pain and swelling of left lower extremity 02/12/2018  ? Sacroiliac pain 07/31/2016  ? Acute on chronic systolic CHF (congestive heart failure) ([place]) 06/27/2016  ? Hypokalemia 06/25/2016  ? NSTEMI (non-ST elevated myocardial infarction) ([place]) 06/23/2016  ? Acute exacerbation of CHF (congestive heart failure) ([place]) 06/22/2016  ? DDD (degenerative disc disease), lumbar 06/13/2016  ? Tobacco abuse 06/13/2016  ? Dizziness 03/30/2015  ? Former smoker 09/29/2014  ? Syncope 06/01/2014  ? Insomnia 06/01/2014  ? Ischemic cardiomyopathy   ? Ventricular tachycardia 01/23/2012  ? Single implantable cardioverter-defibrillator-BSx 12/11/2011  ? SYNCOPE AND COLLAPSE 09/26/2010  ? Hyperlipidemia 02/23/2010  ? Hypotension 02/23/2010  ? CAD (coronary artery disease) 02/23/2010  ? ? ?Conditions to be addressed/monitored:CHF, HTN, and wound ? ?Care Plan : [facility]  General Plan of Care (Adult)  ?Updates made by Leona Singleton, RN since 10/26/2021 12:00 AM  ?  ? ?Problem: KNOWLEDGE DEFICIT RELATED TO SELF CARE MANAGEMENT OF CHRONIC CONDITIONS   ?Priority: Medium  ?  ? ?Long-Range Goal: Patient will work with CCM team to gain knowledge of chroic conditions   ?Start Date: 09/05/2021  ?Expected End Date: 09/05/2021  ?Priority: Medium  ?Note:   ?Current Barriers:  ?Knowledge Deficits related to plan of care for management of CHF and Left arm wound;   Patient with left arm wound after sepsis episode January 2023;  underwent skin grafting 2 weeks ago.  States he does his own dressing changes, declined home health and provider aware.  Denies any issues with ski graft area.  Weight 196 pounds.  BP  110-120/50-60's ?3/31--left arm wound doing well; patient applying wet to dry dressing changes at this time.  Denies any pain, sob lower extremity edema.  Sid notice increase in weight.  attended appointment with cardiology and medications adjusted based on weight.  Patient reports taking new doses of medication and an additional dose of Demadex every other day. WEIGHT DECREASED TO 204 POUNDS.  Has not been checking bp at home ? ?RNCM Clinical Goal(s):  ?Patient will demonstrate improved health management independence as evidenced by daily weight monitoring and no sigs symptoms recurrent infection        through collaboration with RN Care manager, provider, and care team.  ? ?Interventions: ?1:1 collaboration with primary care provider regarding development and update of comprehensive plan of care as evidenced by provider attestation and co-signature ?Inter-disciplinary care team collaboration (see longitudinal plan of care) ?Evaluation of current treatment plan related to  self management and patient's adherence to plan as established by provider ? ? ?Heart Failure Interventions:  (Status: Goal on track: NO.)  Long Term Goal  ?Wt Readings from Last 3 Encounters:  ?08/24/21 195 lb (88.5 kg)  ?08/23/21 195 lb (88.5 kg)  ?08/23/21 196 lb (88.9 kg)  ? ?  Basic overview and discussion of pathophysiology of Heart Failure reviewed ?Provided education on low sodium diet ?Reviewed Heart Failure Action Plan in depth and provided written copy ?Advised patient to weigh each morning after emptying bladder ?Discussed importance of daily weight and advised patient to weigh and record daily ?Reviewed role of diuretics in prevention of fluid overload and management of heart failure ?Discussed the importance of keeping all appointments with provider ?Provided patient with education about the role of exercise in the management of heart failure ?Encouraged to monitor BP at least weekly ?Verified patient taking medications as adjusted by  Cardiologist and he understood new instructions ? ?Left Arm Wound  (Status: Goal on Track (progressing): YES.) Long Term Goal  ?Evaluation of current treatment plan related to  arm wound , Level of care concerns self-managem

## 2021-10-26 NOTE — Patient Instructions (Addendum)
Visit Information ? ?Thank you for taking time to visit with me today. Please don't hesitate to contact me if I can be of assistance to you before our next scheduled telephone appointment. ? ?Following are the goals we discussed today:  ? Take medications as prescribed   ? Attend all scheduled provider appointments ? Perform all self care activities independently  ? Perform IADL's (shopping, preparing meals, housekeeping, managing finances) independently ? call office if I gain more than 2 pounds in one day or 5 pounds in one week ? use salt in moderation ? watch for swelling in feet, ankles and legs every day ? weigh myself daily ? bring diary to all appointments ? develop a rescue plan ? follow rescue plan if symptoms flare-up ? know when to call the doctorbased on weight, heart failure symptoms, wound assessment ? ?Our next appointment is by telephone on 4/21 at 0930 ? ?Please call the care guide team at [phone] if you need to cancel or reschedule your appointment.  ? ?If you are experiencing a Mental Health or [place] or need someone to talk to, please call the Suicide and Crisis Lifeline: 988 ?call the [place] National Suicide Prevention Lifeline: 331-508-4975 or TTY: (260)686-9194 TTY 604-848-8405) to talk to a trained counselor ?call 1-800-273-TALK (toll free, 24 hour hotline) ?call 911  ? ?The patient verbalized understanding of instructions, educational materials, and care plan provided today and declined offer to receive copy of patient instructions, educational materials, and care plan.  ? ?Hubert Azure RN, MSN ?RN Care Management Coordinator ?[place] ?[phone] ?[email or web address]  ?

## 2021-11-02 ENCOUNTER — Telehealth: Payer: Self-pay | Admitting: Family

## 2021-11-02 ENCOUNTER — Encounter: Payer: Self-pay | Admitting: Family

## 2021-11-02 ENCOUNTER — Ambulatory Visit (HOSPITAL_BASED_OUTPATIENT_CLINIC_OR_DEPARTMENT_OTHER): Payer: Medicare Other | Admitting: Family

## 2021-11-02 ENCOUNTER — Other Ambulatory Visit
Admission: RE | Admit: 2021-11-02 | Discharge: 2021-11-02 | Disposition: A | Discharge status: Home / Self Care | Payer: Medicare Other | Source: Ambulatory Visit | Attending: Family | Admitting: Family

## 2021-11-02 VITALS — BP 131/79 | HR 54 | Resp 20 | Ht 68.0 in | Wt 201.0 lb

## 2021-11-02 DIAGNOSIS — F419 Anxiety disorder, unspecified: Secondary | ICD-10-CM | POA: Insufficient documentation

## 2021-11-02 DIAGNOSIS — E785 Hyperlipidemia, unspecified: Secondary | ICD-10-CM | POA: Insufficient documentation

## 2021-11-02 DIAGNOSIS — I700 Atherosclerosis of aorta: Secondary | ICD-10-CM | POA: Insufficient documentation

## 2021-11-02 DIAGNOSIS — J449 Chronic obstructive pulmonary disease, unspecified: Secondary | ICD-10-CM | POA: Insufficient documentation

## 2021-11-02 DIAGNOSIS — Z8249 Family history of ischemic heart disease and other diseases of the circulatory system: Secondary | ICD-10-CM | POA: Diagnosis not present

## 2021-11-02 DIAGNOSIS — I110 Hypertensive heart disease with heart failure: Secondary | ICD-10-CM | POA: Insufficient documentation

## 2021-11-02 DIAGNOSIS — Z955 Presence of coronary angioplasty implant and graft: Secondary | ICD-10-CM | POA: Insufficient documentation

## 2021-11-02 DIAGNOSIS — I255 Ischemic cardiomyopathy: Secondary | ICD-10-CM | POA: Diagnosis not present

## 2021-11-02 DIAGNOSIS — Z79899 Other long term (current) drug therapy: Secondary | ICD-10-CM | POA: Insufficient documentation

## 2021-11-02 DIAGNOSIS — I2510 Atherosclerotic heart disease of native coronary artery without angina pectoris: Secondary | ICD-10-CM | POA: Insufficient documentation

## 2021-11-02 DIAGNOSIS — Z95810 Presence of automatic (implantable) cardiac defibrillator: Secondary | ICD-10-CM | POA: Diagnosis not present

## 2021-11-02 DIAGNOSIS — I5022 Chronic systolic (congestive) heart failure: Secondary | ICD-10-CM | POA: Insufficient documentation

## 2021-11-02 DIAGNOSIS — I10 Essential (primary) hypertension: Secondary | ICD-10-CM | POA: Diagnosis not present

## 2021-11-02 DIAGNOSIS — Z8674 Personal history of sudden cardiac arrest: Secondary | ICD-10-CM | POA: Diagnosis not present

## 2021-11-02 DIAGNOSIS — T148XXA Other injury of unspecified body region, initial encounter: Secondary | ICD-10-CM

## 2021-11-02 DIAGNOSIS — I472 Ventricular tachycardia, unspecified: Secondary | ICD-10-CM

## 2021-11-02 DIAGNOSIS — Z9049 Acquired absence of other specified parts of digestive tract: Secondary | ICD-10-CM | POA: Diagnosis not present

## 2021-11-02 LAB — BASIC METABOLIC PANEL
Anion gap: 12 (ref 5–15)
BUN: 54 mg/dL — ABNORMAL HIGH (ref 8–23)
CO2: 22 mmol/L (ref 22–32)
Calcium: 9.3 mg/dL (ref 8.9–10.3)
Chloride: 101 mmol/L (ref 98–111)
Creatinine, Ser: 2.08 mg/dL — ABNORMAL HIGH (ref 0.61–1.24)
GFR, Estimated: 34 mL/min — ABNORMAL LOW (ref >60)
Glucose, Bld: 104 mg/dL — ABNORMAL HIGH (ref 70–99)
Potassium: 4.7 mmol/L (ref 3.5–5.1)
Sodium: 135 mmol/L (ref 135–145)

## 2021-11-02 MED ORDER — SPIRONOLACTONE 25 MG PO TABS: 12.5000 mg | ORAL_TABLET | Freq: Every day | ORAL | 1 refills | Status: DC

## 2021-11-02 NOTE — Telephone Encounter (Signed)
Spoke with patient regarding BMP results obtained earlier today. Renal function has worsened since starting spironolactone. Cardiology had resumed it as 12.'5mg'$  daily but patient has been taking it as '25mg'$  daily.  ? ?Creatinine is now 2.08 (1 month ago it was 1.31) and GFR is now 34 (1 month ago it was 59). ? ?Advised patient to decrease the spironolactone to the 12.'5mg'$  daily as cardiology had ordered. Have sent cardiology a note asking if he wanted it stopped completely or continue the 12.'5mg'$  and patient was told that I would call him back on Monday to advise of long-term instructions. Did tell him labs would need to be rechecked in a couple of weeks.  ? ?Patient verbalized understanding.  ?

## 2021-11-02 NOTE — Patient Instructions (Addendum)
Resume weighing daily and call for an overnight weight gain of 3 pounds or more or a weekly weight gain of more than 5 pounds.   If you have voicemail, please make sure your mailbox is cleaned out so that we may leave a message and please make sure to listen to any voicemails.     

## 2021-11-02 NOTE — Progress Notes (Signed)
? Patient ID: Ian Mills, male    DOB: 18-Jun-1956, 66 y.o.   MRN: [id number] ? ?HPI ? ?Mr Rothgeb is a 66 y/o male with a history of CAD, hyperlipidemia, HTN, anxiety, aortic atherosclerosis, COPD, VT (AICD) previous tobacco use and chronic heart failure.  ? ?Echo report from 07/12/21 reviewed and showed an EF of 25-30% ? ?LHC done 08/30/19 showed: ?Prox RCA to Mid RCA lesion is 20% stenosed. ?Prox RCA lesion is 30% stenosed. ?Prox LAD to Mid LAD lesion is 100% stenosed. ?1st Diag-1 lesion is 60% stenosed. ?Previously placed 1st Diag-2 stent (unknown type) is widely patent. ?Ost Cx to Prox Cx lesion is 60% stenosed. ?Mid Cx lesion is 40% stenosed. ?  ?1. Significant underlying three-vessel coronary artery disease with chronically occluded mid LAD, patent first diagonal stent, patent RCA stents with mild in-stent restenosis, patent left circumflex stent with moderate in-stent restenosis.  Progression of ostial left circumflex stenosis to 60%. ?2.  Left ventricular angiography was not performed due to chronic kidney disease. ?3.  Moderately elevated left ventricular end-diastolic pressure between 25 to 30 mmHg. ? ?Admitted 07/29/21 due to septic shock, present on admission with Streptococcus pyogenes growing out of blood cultures and left upper extremity extensive cellulitis. Wound, surgical and ID consults obtained. Initially required pressors due to hypotension.  A CT scan of the arm showed extensive cellulitis but no necrotizing fasciitis. IV antibiotics started but due to fluid overload, changed to PO antibiotics. Given IV lasix with transition to oral diuretics. Discharged after 9 days.  ? ?He presents today for a follow-up visit with a chief complaint of minimal fatigue upon moderate exertion. Describes this as chronic in nature. He has associated left arm pain occasionally from his skin graft along with this. He denies any difficulty sleeping, dizziness, abdominal distention, palpitations, pedal edema, chest  pain, shortness of breath or cough.  ? ?Not weighing daily but does have scales. Resumed spironolactone after cardiology visit but thought he was to take '25mg'$  daily as opposed to cardiology note of 12.'5mg'$  daily.  ? ?Left lower arm wound continues to heal.  ? ?Past Medical History:  ?Diagnosis Date  ? Acute anterior wall MI ([place]) 07/04/1991  ? a.) LHC: EF 29%; 100% mLAD -> PTCA performed  ? Anxiety   ? Aortic atherosclerosis ([place])   ? CHF (congestive heart failure) ([place])   ? COPD (chronic obstructive pulmonary disease) ([place])   ? Coronary artery disease   ? a.) 3 stents --> 3x18 mm Duet to OM3, 2.5x18 mm Duet to D1, 2.5x13 mm Duet to RPDA. b. 06/2013 PCI: CTO LAD, patent LAD stent, RCA 70p (FFR 0.74--> DES), 41m(DES), RPDA 50%, EF 20%; c. 08/2019 Cath: LM nl, LAD 100p/m, D1 60, D2 patent stent, LCX 60ost/p, 456mSR, RCA 30p, 2068mR.  ? Family history of lung cancer   ? HFrEF (heart failure with reduced ejection fraction) ([place] ? a. 05/2014 Echo: EF 25-30%; b. 01/2018 Echo ([facility]): EF 30%; c. 06/2021 Echo: EF 25-30%, apical AK, GrI DD, nl RV fxn.  ? HLD (hyperlipidemia)   ? HTN (hypertension)   ? ICD  single,BSX   ? a. DOI 11/2011; b. S/N# [id number] Ischemic cardiomyopathy   ? a. 05/2014 Echo: EF 25-30%; b. 01/2018 Echo ([facility]): EF 30%, c. 06/2021 Echo: EF 25-30%.  ? Lateral wall myocardial infarction ([facility]/08/1996  ? a.) LHC: EF 44%; 75% RPDA and 95% OM3; PCI performed and a 3.0 x 18 mm Duet stent  placed to OM3  ? Long term current use of antithrombotics/antiplatelets   ? a.) DAPT therapy (ASA + clopidogrel)  ? NSTEMI (non-ST elevated myocardial infarction) ([place]) 11/27/2011  ? a.) LHC: 100% mLAD; intervention deferred opting for medical management.  ? Personal history of colonic polyps   ? Pneumonia 08/2018  ? Sepsis ([place]) 07/29/2021  ? Syncope and collapse   ? Ventricular tachycardia 12/01/2011  ? a.) VT arrest; ROSC achieved --[place] placed.  ? ?Past Surgical History:  ?Procedure Laterality Date   ? APPENDECTOMY N/A   ? CARDIAC DEFIBRILLATOR PLACEMENT N/A 12/01/2011  ? COLONOSCOPY WITH PROPOFOL N/A 12/22/2020  ? Procedure: COLONOSCOPY WITH PROPOFOL;  Surgeon: Jonathon Bellows, MD;  Location[facility] ENDOSCOPY;  Service: Gastroenterology;  Laterality: N/A;  ? CORONARY ANGIOPLASTY Left 07/05/1991  ? Procedure: CORONARY ANGIOPLASTY; Location: [facility]; Surgeon: Doristine Bosworth, MD  ? CORONARY ANGIOPLASTY Left 08/06/1991  ? Procedure: CORONARY ANGIOPLASTY; Location: [facility]; Surgeon: Ellouise Newer, MD  ? CORONARY ANGIOPLASTY Left 08/09/1991  ? Procedure: CORONARY ANGIOPLASTY; Location: [facility]; Surgeon: Ellouise Newer, MD  ? CORONARY ANGIOPLASTY Left 10/28/1991  ? Procedure: CORONARY ANGIOPLASTY; Location: [facility]; Surgeon: Ellouise Newer, MD  ? CORONARY ANGIOPLASTY Left 11/02/1991  ? Procedure: CORONARY ANGIOPLASTY; Location: [facility]; Surgeon: Ellouise Newer, MD  ? CORONARY ANGIOPLASTY Left 01/10/1992  ? Procedure: CORONARY ANGIOPLASTY; Location: [facility]; Surgeon: Carles Collet, MD  ? CORONARY ANGIOPLASTY Left 09/26/1993  ? Procedure: CORONARY ANGIOPLASTY; Location: [facility]; Surgeon: Melton Alar, MD  ? CORONARY ANGIOPLASTY Left 09/11/2006  ? Procedure: CORONARY ANGIOPLASTY; Location: [facility]  ? CORONARY ANGIOPLASTY Left 01/17/2010  ? Procedure: CORONARY ANGIOPLASTY; Location: [facility]; Surgeon: Wendee Beavers, MD  ? CORONARY ANGIOPLASTY Left 11/27/2011  ? Procedure: CORONARY ANGIOPLASTY; Location: [facility]; Surgeon: Derinda Sis, MD  ? CORONARY ANGIOPLASTY WITH STENT PLACEMENT Left 06/29/1997  ? Procedure: CORONARY ANGIOPLASTY WITH STENT PLACEMENT (3.0 x 18 mm Diet stent to OM3); Location: [facility]; Surgeon: Allie Bossier, MD  ? CORONARY ANGIOPLASTY WITH STENT PLACEMENT Left 07/12/1998  ? Procedure: CORONARY ANGIOPLASTY WITH STENT PLACEMENT (2.8 x 8 mm Duet stent to D1, 2.5 x 13 mm Duet stent to RPDA); Location: [facility]  ? GRAFT APPLICATION Left 3/87/5643  ? Procedure: GRAFT APPLICATION;  Surgeon: Benjamine Sprague, DO;  Location: [facility] ORS;  Service: General;  Laterality:  Left;  ? LEFT HEART CATH AND CORONARY ANGIOGRAPHY Left 08/30/2019  ? Procedure: LEFT HEART CATH AND CORONARY ANGIOGRAPHY;  Surgeon: Wellington Hampshire, MD;  Location: [place] CV LAB;  Service: Cardiovascular;  Laterality: Left;  ? PROSTATE SURGERY    ? urethra grew around prostate   ? TUMOR REMOVAL  2001  ? chest thymic cyst; behind lungs [facility] benign   ? ?Family History  ?Problem Relation Age of Onset  ? Cancer - Lung Mother   ? Hypertension Mother   ? Heart attack Mother   ? Cancer Father   ?     larynx  ? Dementia Maternal Grandmother   ? Heart attack Paternal Grandfather   ? Colon cancer Neg Hx   ? Stomach cancer Neg Hx   ? Pancreatic cancer Neg Hx   ? ?Social History  ? ?Tobacco Use  ? Smoking status: Former  ?  Packs/day: 0.50  ?  Years: 42.00  ?  Pack years: 21.00  ?  Types: Cigarettes  ?  Quit date: 11/10/2019  ?  Years since quitting: 1.9  ? Smokeless tobacco: Never  ?Substance Use Topics  ? Alcohol use: Yes  ?  Alcohol/week: 8.0 standard drinks  ?  Types: 8 Cans of beer per week  ?  Comment: per week  ? ?Allergies  ?Allergen Reactions  ? Nitroglycerin Nausea Only and Other (See Comments)  ?  Per patient "causes severe  headache"  ? ?Prior to Admission medications   ?Medication Sig Start Date End Date Taking? Authorizing Provider  ?albuterol (VENTOLIN HFA) 108 (90 Base) MCG/ACT inhaler Inhale 1 puff into the lungs every 6 (six) hours as needed for wheezing or shortness of breath.   Yes [provider]  ?ALPRAZolam (XANAX) 0.5 MG tablet Take 1 tablet (0.5 mg total) by mouth 3 (three) times daily as needed. for anxiety 06/26/21  Yes McLean-Scocuzza, Nino Glow, MD  ?amiodarone (PACERONE) 200 MG tablet Take 1 tablet by mouth once daily 09/20/21  Yes Deboraha Sprang, MD  ?aspirin EC 81 MG tablet Take 81 mg by mouth daily. Swallow whole.   Yes [provider]  ?atorvastatin (LIPITOR) 80 MG tablet Take 1 tablet (80 mg total) by mouth daily. 02/14/21  Yes McLean-Scocuzza, Nino Glow, MD  ?carvedilol  (COREG) 3.125 MG tablet Take 1 tablet (3.125 mg total) by mouth 2 (two) times daily. 05/02/21  Yes Minna Merritts, MD  ?clopidogrel (PLAVIX) 75 MG tablet Take 75 mg by mouth daily.   Yes Provider, Histor

## 2021-11-05 ENCOUNTER — Telehealth: Payer: Self-pay | Admitting: Family

## 2021-11-05 MED ORDER — TORSEMIDE 20 MG PO TABS: ORAL_TABLET | ORAL | 1 refills | Status: DC

## 2021-11-05 NOTE — Telephone Encounter (Signed)
LM on patient's phone regarding diuretic usage. Had spoken with patient's cardiologist Rockey Situ) about plan with worsening renal function. He wanted to continue 12.'5mg'$  spironolactone and decrease torsemide from daily to 5 days/ week. Will need to recheck labs in a couple of weeks.  ? ?Also LM on wife's number asking her to have patient check his messages and call back with any questions.  ?

## 2021-11-08 DIAGNOSIS — R051 Acute cough: Secondary | ICD-10-CM | POA: Diagnosis not present

## 2021-11-08 DIAGNOSIS — Z20822 Contact with and (suspected) exposure to covid-19: Secondary | ICD-10-CM | POA: Diagnosis not present

## 2021-11-08 DIAGNOSIS — R059 Cough, unspecified: Secondary | ICD-10-CM | POA: Diagnosis not present

## 2021-11-12 ENCOUNTER — Other Ambulatory Visit
Admission: RE | Admit: 2021-11-12 | Discharge: 2021-11-12 | Disposition: A | Discharge status: Home / Self Care | Payer: Medicare Other | Source: Ambulatory Visit | Attending: Family | Admitting: Family

## 2021-11-12 ENCOUNTER — Encounter
Admission: RE | Admit: 2021-11-12 | Discharge: 2021-11-12 | Disposition: A | Discharge status: Home / Self Care | Payer: Medicare Other | Source: Ambulatory Visit | Attending: Family | Admitting: Family

## 2021-11-12 ENCOUNTER — Other Ambulatory Visit: Payer: Self-pay | Admitting: Family

## 2021-11-12 ENCOUNTER — Telehealth: Payer: Self-pay | Admitting: Family

## 2021-11-12 DIAGNOSIS — I509 Heart failure, unspecified: Secondary | ICD-10-CM | POA: Diagnosis not present

## 2021-11-12 LAB — BASIC METABOLIC PANEL
Anion gap: 9 (ref 5–15)
BUN: 38 mg/dL — ABNORMAL HIGH (ref 8–23)
CO2: 23 mmol/L (ref 22–32)
Calcium: 8.8 mg/dL — ABNORMAL LOW (ref 8.9–10.3)
Chloride: 102 mmol/L (ref 98–111)
Creatinine, Ser: 1.78 mg/dL — ABNORMAL HIGH (ref 0.61–1.24)
GFR, Estimated: 41 mL/min — ABNORMAL LOW (ref >60)
Glucose, Bld: 103 mg/dL — ABNORMAL HIGH (ref 70–99)
Potassium: 4.4 mmol/L (ref 3.5–5.1)
Sodium: 134 mmol/L — ABNORMAL LOW (ref 135–145)

## 2021-11-12 MED ORDER — TORSEMIDE 20 MG PO TABS: ORAL_TABLET | ORAL | 1 refills | Status: DC

## 2021-11-12 MED ORDER — SPIRONOLACTONE 25 MG PO TABS: 25.0000 mg | ORAL_TABLET | Freq: Every day | ORAL | 1 refills | Status: DC

## 2021-11-12 NOTE — Telephone Encounter (Signed)
Spoke with patient regarding BMP results obtained earlier today. Renal function is improving with the reduction of torsemide to 5 days/ week.  ? ?Patient asks if he can take a whole '25mg'$  spironolactone daily because he says that cutting them in half is extremely difficult due to the size of the pill and the coating of the pill.  ? ?Advised patient to take '25mg'$  spironolactone daily and take his torsemide on M, W, F. Will need to recheck his labs next week. He says that he will call us back with what day of the week he can come get his labs drawn. He verbalized understanding of medication changes. ?

## 2021-11-16 ENCOUNTER — Telehealth: Payer: Self-pay | Admitting: *Deleted

## 2021-11-16 ENCOUNTER — Telehealth: Payer: Medicare Other

## 2021-11-16 NOTE — Telephone Encounter (Signed)
?  Care Management  ? ?Follow Up Note ? ? ?11/16/2021 ?Name: Ian Mills MRN: [id number] DOB: 09-02-1955 ? ? ?Referred by: McLean-Scocuzza, Nino Glow, MD ?Reason for referral : Chronic Care Management (CHF, HTN) ? ? ?An unsuccessful telephone outreach was attempted today. The patient was referred to the case management team for assistance with care management and care coordination.  ? ?Follow Up Plan: RNCM will seek assistance from Care Guides in rescheduling appointment within the next 30 days. ? ?Hubert Azure RN, MSN ?RN Care Management Coordinator ?[place] ?[phone] ?[email or web address] ? ?

## 2021-11-20 ENCOUNTER — Telehealth: Payer: Self-pay | Admitting: Family

## 2021-11-20 NOTE — Telephone Encounter (Signed)
Patient called to get clarification on his medication changes per his recent lab work from [facility], [place]. Otila Kluver advised patient on 4/17 what to do and patient stated he understood but then forgot. I re advised patient per Tina's Telephone note to take his spirolactone '25mg'$  daily and take his torsemide on M, W,  F only and patient states he understands now. ? ?Chauntelle Azpeitia, NT ?

## 2021-11-28 ENCOUNTER — Ambulatory Visit (INDEPENDENT_AMBULATORY_CARE_PROVIDER_SITE_OTHER): Payer: Medicare Other | Admitting: *Deleted

## 2021-11-28 DIAGNOSIS — I5042 Chronic combined systolic (congestive) and diastolic (congestive) heart failure: Secondary | ICD-10-CM

## 2021-11-28 DIAGNOSIS — I10 Essential (primary) hypertension: Secondary | ICD-10-CM

## 2021-11-28 NOTE — Chronic Care Management (AMB) (Signed)
?Chronic Care Management  ? ?CCM RN Visit Note ? ?11/28/2021 ?Name: Ian Mills MRN: [id number] DOB: August 03, 1955 ? ?Subjective: ?Ian Mills is a 66 y.o. year old male who is a primary care patient of McLean-Scocuzza, Nino Glow, MD. The care management team was consulted for assistance with disease management and care coordination needs.   ? ?Engaged with patient by telephone for follow up visit in response to provider referral for case management and/or care coordination services.  ? ?Consent to Services:  ?The patient was given information about Chronic Care Management services, agreed to services, and gave verbal consent prior to initiation of services.  Please see initial visit note for detailed documentation.  ? ?Patient agreed to services and verbal consent obtained.  ? ?Assessment: Review of patient past medical history, allergies, medications, health status, including review of consultants reports, laboratory and other test data, was performed as part of comprehensive evaluation and provision of chronic care management services.  ? ?SDOH (Social Determinants of Health) assessments and interventions performed:   ? ?CCM Care Plan ? ?Allergies  ?Allergen Reactions  ? Nitroglycerin Nausea Only and Other (See Comments)  ?  Per patient "causes severe  headache"  ? ? ?Outpatient Encounter Medications as of 11/28/2021  ?Medication Sig  ? sacubitril-valsartan (ENTRESTO) 97-103 MG Take 1 tablet by mouth 2 (two) times daily.  ? spironolactone (ALDACTONE) 25 MG tablet Take 1 tablet (25 mg total) by mouth daily.  ? Tiotropium Bromide-Olodaterol (STIOLTO RESPIMAT) 2.5-2.5 MCG/ACT AERS Inhale 2 puffs into the lungs daily.  ? torsemide (DEMADEX) 20 MG tablet Take 1 tablet (20 mg) by mouth on M, W, F.  You may take 1 extra tablet (20 mg) by mouth once daily as needed for swelling/ weight gain/ shortness of breath  ? albuterol (VENTOLIN HFA) 108 (90 Base) MCG/ACT inhaler Inhale 1 puff into the lungs every 6 (six) hours as  needed for wheezing or shortness of breath.  ? ALPRAZolam (XANAX) 0.5 MG tablet Take 1 tablet (0.5 mg total) by mouth 3 (three) times daily as needed. for anxiety  ? amiodarone (PACERONE) 200 MG tablet Take 1 tablet by mouth once daily  ? aspirin EC 81 MG tablet Take 81 mg by mouth daily. Swallow whole.  ? atorvastatin (LIPITOR) 80 MG tablet Take 1 tablet (80 mg total) by mouth daily.  ? carvedilol (COREG) 3.125 MG tablet Take 1 tablet (3.125 mg total) by mouth 2 (two) times daily.  ? clopidogrel (PLAVIX) 75 MG tablet Take 75 mg by mouth daily.  ? ezetimibe (ZETIA) 10 MG tablet Take 1 tablet (10 mg total) by mouth daily.  ? ipratropium-albuterol (DUONEB) 0.5-2.5 (3) MG/3ML SOLN Take 3 mLs by nebulization every 6 (six) hours as needed.  ? potassium chloride (KLOR-CON 10) 10 MEQ tablet Take 1 tablet (10 mEq total) by mouth daily.  ? tadalafil (CIALIS) 10 MG tablet Take 1-2 tablets (10-20 mg total) by mouth daily as needed for erectile dysfunction.  ? ?No facility-administered encounter medications on file as of 11/28/2021.  ? ? ?Patient Active Problem List  ? Diagnosis Date Noted  ? Thrombocytopenia ([place])   ? Sepsis due to group A Streptococcus with acute renal failure and septic shock ([place])   ? Left arm cellulitis   ? Transaminitis   ? Acute kidney injury superimposed on CKD ([place])   ? Septic shock ([place]) 07/29/2021  ? Genetic testing 02/02/2021  ? Family history of lung cancer 01/23/2021  ? Personal history of colonic polyps 01/23/2021  ?  History of prostate surgery 06/09/2020  ? Erectile dysfunction 06/09/2020  ? Healthcare maintenance 03/27/2020  ? Pain of left heel 03/03/2020  ? Obesity (BMI 30-39.9) 03/03/2020  ? Aortic atherosclerosis ([place]) 02/20/2020  ? 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 Essential hypertension 10/13/2019  ? Unstable angina ([facility])   ? Carotid artery stenosis 06/10/2019  ? Baker's cyst 06/10/2019  ? Bilateral impacted cerumen 06/10/2019  ? Chronic systolic CHF (congestive  heart failure) ([place]) 06/10/2019  ? Scoliosis 06/10/2019  ? Anxiety 04/15/2019  ? Chronic obstructive pulmonary disease ([place]) 04/15/2019  ? COPD exacerbation ([place]) 09/08/2018  ? Chest tightness 02/12/2018  ? Pain and swelling of left lower extremity 02/12/2018  ? Sacroiliac pain 07/31/2016  ? Acute on chronic systolic CHF (congestive heart failure) ([place]) 06/27/2016  ? Hypokalemia 06/25/2016  ? NSTEMI (non-ST elevated myocardial infarction) ([place]) 06/23/2016  ? Acute exacerbation of CHF (congestive heart failure) ([place]) 06/22/2016  ? DDD (degenerative disc disease), lumbar 06/13/2016  ? Tobacco abuse 06/13/2016  ? Dizziness 03/30/2015  ? Former smoker 09/29/2014  ? Syncope 06/01/2014  ? Insomnia 06/01/2014  ? Ischemic cardiomyopathy   ? Ventricular tachycardia ([place]) 01/23/2012  ? Single implantable cardioverter-defibrillator-BSx 12/11/2011  ? SYNCOPE AND COLLAPSE 09/26/2010  ? Hyperlipidemia 02/23/2010  ? Hypotension 02/23/2010  ? CAD (coronary artery disease) 02/23/2010  ? ? ?Conditions to be addressed/monitored:CHF, HTN, and arm wound ? ?Care Plan : [facility]  General Plan of Care (Adult)  ?Updates made by Leona Singleton, RN since 11/28/2021 12:00 AM  ?  ? ?Problem: KNOWLEDGE DEFICIT RELATED TO SELF CARE MANAGEMENT OF CHRONIC CONDITIONS   ?Priority: Medium  ?  ? ?Long-Range Goal: Patient will work with CCM team to gain knowledge of chroic conditions   ?Start Date: 09/05/2021  ?Expected End Date: 09/05/2021  ?Priority: Medium  ?Note:   ?Current Barriers:  ?Knowledge Deficits related to plan of care for management of CHF and Left arm wound;   Patient with left arm wound after sepsis episode January 2023;  underwent skin grafting 2 weeks ago.  States he does his own dressing changes, declined home health and provider aware.  Denies any issues with ski graft area.  Weight 196 pounds.  BP 110-120/50-60's ?3/31--left arm wound doing well; patient applying wet to dry dressing changes at this time.  Denies any pain, sob lower  extremity edema.  Sid notice increase in weight.  attended appointment with cardiology and medications adjusted based on weight.  Patient reports taking new doses of medication and an additional dose of Demadex every other day. WEIGHT DECREASED TO 204 POUNDS.  Has not been checking bp at home ?5/3--Doing well.  Denies any chest pains, SOB, or swelling.  Weight yesterday was 201 pounds, still needs to weigh today.  Able to verbalize medication adjustments correctly.  Reports arm wound is healing well, no open areas.  Still has not been checking bp at home. ? ?RNCM Clinical Goal(s):  ?Patient will demonstrate improved health management independence as evidenced by daily weight monitoring and no sigs symptoms recurrent infection        through collaboration with RN Care manager, provider, and care team.  ? ?Interventions: ?1:1 collaboration with primary care provider regarding development and update of comprehensive plan of care as evidenced by provider attestation and co-signature ?Inter-disciplinary care team collaboration (see longitudinal plan of care) ?Evaluation of current treatment plan related to  self management and patient's adherence to plan as established by provider ? ? ?Heart Failure Interventions:  (Status:  Goal on track: NO.)  Long Term Goal  ?Wt Readings from Last 3 Encounters:  ?08/24/21 195 lb (88.5 kg)  ?08/23/21 195 lb (88.5 kg)  ?08/23/21 196 lb (88.9 kg)  ? ?Basic overview and discussion of pathophysiology of Heart Failure reviewed ?Provided education on low sodium diet ?Reviewed Heart Failure Action Plan in depth and provided written copy ?Advised patient to weigh each morning after emptying bladder ?Discussed importance of daily weight and advised patient to weigh and record daily ?Reviewed role of diuretics in prevention of fluid overload and management of heart failure ?Discussed the importance of keeping all appointments with provider ?Provided patient with education about the role of  exercise in the management of heart failure ?Encouraged to monitor BP at least 3 times a week  (days you take Demadex) ?Verified patient taking medications as adjusted by Cardiologist and he understood new instructions

## 2021-11-28 NOTE — Patient Instructions (Addendum)
Visit Information ? ?Thank you for taking time to visit with me today. Please don't hesitate to contact me if I can be of assistance to you before our next scheduled telephone appointment. ? ?Following are the goals we discussed today:  ?Take medications as prescribed   ?Attend all scheduled provider appointments ?Perform all self care activities independently  ?Perform IADL's (shopping, preparing meals, housekeeping, managing finances) independently ?call office if I gain more than 2 pounds in one day or 5 pounds in one week ?use salt in moderation ?watch for swelling in feet, ankles and legs every day ?weigh myself daily ?bring diary to all appointments ?develop a rescue plan ?follow rescue plan if symptoms flare-up ?know when to call the doctor based on weight, heart failure symptoms, wound assessment ? ?Our next appointment is by telephone on 6/9 at 1030 ? ?Please call the care guide team at [phone] if you need to cancel or reschedule your appointment.  ? ?If you are experiencing a Mental Health or [place] or need someone to talk to, please call the Suicide and Crisis Lifeline: 988 ?call the [place] National Suicide Prevention Lifeline: (517)629-6884 or TTY: 269 112 5801 TTY (754)710-3540) to talk to a trained counselor ?call 1-800-273-TALK (toll free, 24 hour hotline) ?call 911  ? ?The patient verbalized understanding of instructions, educational materials, and care plan provided today and declined offer to receive copy of patient instructions, educational materials, and care plan.  ? ?Hubert Azure RN, MSN ?RN Care Management Coordinator ?[place] ?[phone] ?[email or web address] ? ?

## 2021-12-11 ENCOUNTER — Ambulatory Visit (INDEPENDENT_AMBULATORY_CARE_PROVIDER_SITE_OTHER): Payer: Medicare Other

## 2021-12-11 DIAGNOSIS — I255 Ischemic cardiomyopathy: Secondary | ICD-10-CM | POA: Diagnosis not present

## 2021-12-11 LAB — CUP PACEART REMOTE DEVICE CHECK
Battery Remaining Longevity: 36 mo
Battery Remaining Percentage: 38 %
Brady Statistic RV Percent Paced: 0 %
Date Time Interrogation Session: 20230516102300
HighPow Impedance: 80 Ohm
Implantable Lead Implant Date: 20130503
Implantable Lead Location: 753860
Implantable Lead Model: 292
Implantable Lead Serial Number: 112568
Implantable Pulse Generator Implant Date: 20130503
Lead Channel Impedance Value: 582 Ohm
Lead Channel Pacing Threshold Amplitude: 0.6 V
Lead Channel Pacing Threshold Pulse Width: 0.5 ms
Lead Channel Setting Pacing Amplitude: 2.4 V
Lead Channel Setting Pacing Pulse Width: 0.5 ms
Lead Channel Setting Sensing Sensitivity: 0.6 mV
Pulse Gen Serial Number: 105232

## 2021-12-13 ENCOUNTER — Other Ambulatory Visit: Payer: Self-pay | Admitting: Family

## 2021-12-13 MED ORDER — SPIRONOLACTONE 25 MG PO TABS: 25.0000 mg | ORAL_TABLET | Freq: Every day | ORAL | 3 refills | Status: DC

## 2021-12-14 ENCOUNTER — Telehealth: Payer: Self-pay | Admitting: Cardiovascular Disease

## 2021-12-14 MED ORDER — SACUBITRIL-VALSARTAN 97-103 MG PO TABS: 1.0000 | ORAL_TABLET | Freq: Two times a day (BID) | ORAL | 0 refills | Status: DC

## 2021-12-14 NOTE — Telephone Encounter (Signed)
Requested Prescriptions   Signed Prescriptions Disp Refills   sacubitril-valsartan (ENTRESTO) 97-103 MG 180 tablet 0    Sig: Take 1 tablet by mouth 2 (two) times daily.    Authorizing Provider: Minna Merritts    Ordering User: Raelene Bott, Jaclin Finks L

## 2021-12-14 NOTE — Telephone Encounter (Signed)
*  STAT* If patient is at the pharmacy, call can be transferred to refill team.   1. Which medications need to be refilled? (please list name of each medication and dose if known)  sacubitril-valsartan (ENTRESTO) 97-103 MG  2. Which pharmacy/location (including street and city if local pharmacy) is medication to be sent to? [facility] [place], [place]  3. Do they need a 30 day or 90 day supply?  90 day supply

## 2021-12-26 DIAGNOSIS — I509 Heart failure, unspecified: Secondary | ICD-10-CM

## 2021-12-27 NOTE — Progress Notes (Signed)
Remote ICD transmission.   

## 2022-01-04 ENCOUNTER — Ambulatory Visit (INDEPENDENT_AMBULATORY_CARE_PROVIDER_SITE_OTHER): Payer: Medicare Other | Admitting: *Deleted

## 2022-01-04 DIAGNOSIS — J449 Chronic obstructive pulmonary disease, unspecified: Secondary | ICD-10-CM

## 2022-01-04 DIAGNOSIS — I10 Essential (primary) hypertension: Secondary | ICD-10-CM

## 2022-01-04 DIAGNOSIS — I5042 Chronic combined systolic (congestive) and diastolic (congestive) heart failure: Secondary | ICD-10-CM

## 2022-01-04 NOTE — Chronic Care Management (AMB) (Signed)
Chronic Care Management   CCM RN Visit Note  01/04/2022 Name: Ian Mills MRN: [id number] DOB: 01/14/1956  Subjective: Ian Mills is a 66 y.o. year old male who is a primary care patient of McLean-Scocuzza, Nino Glow, MD. The care management team was consulted for assistance with disease management and care coordination needs.    Engaged with patient by telephone for follow up visit in response to provider referral for case management and/or care coordination services.   Consent to Services:  The patient was given information about Chronic Care Management services, agreed to services, and gave verbal consent prior to initiation of services.  Please see initial visit note for detailed documentation.   Patient agreed to services and verbal consent obtained.   Assessment: Review of patient past medical history, allergies, medications, health status, including review of consultants reports, laboratory and other test data, was performed as part of comprehensive evaluation and provision of chronic care management services.   SDOH (Social Determinants of Health) assessments and interventions performed:    CCM Care Plan  Allergies  Allergen Reactions   Nitroglycerin Nausea Only and Other (See Comments)    Per patient "causes severe  headache"    Outpatient Encounter Medications as of 01/04/2022  Medication Sig   albuterol (VENTOLIN HFA) 108 (90 Base) MCG/ACT inhaler Inhale 1 puff into the lungs every 6 (six) hours as needed for wheezing or shortness of breath.   ALPRAZolam (XANAX) 0.5 MG tablet Take 1 tablet (0.5 mg total) by mouth 3 (three) times daily as needed. for anxiety   amiodarone (PACERONE) 200 MG tablet Take 1 tablet by mouth once daily   aspirin EC 81 MG tablet Take 81 mg by mouth daily. Swallow whole.   atorvastatin (LIPITOR) 80 MG tablet Take 1 tablet (80 mg total) by mouth daily.   carvedilol (COREG) 3.125 MG tablet Take 1 tablet (3.125 mg total) by mouth 2 (two) times  daily.   clopidogrel (PLAVIX) 75 MG tablet Take 75 mg by mouth daily.   ezetimibe (ZETIA) 10 MG tablet Take 1 tablet (10 mg total) by mouth daily.   ipratropium-albuterol (DUONEB) 0.5-2.5 (3) MG/3ML SOLN Take 3 mLs by nebulization every 6 (six) hours as needed.   potassium chloride (KLOR-CON 10) 10 MEQ tablet Take 1 tablet (10 mEq total) by mouth daily.   sacubitril-valsartan (ENTRESTO) 97-103 MG Take 1 tablet by mouth 2 (two) times daily.   spironolactone (ALDACTONE) 25 MG tablet Take 1 tablet (25 mg total) by mouth daily.   tadalafil (CIALIS) 10 MG tablet Take 1-2 tablets (10-20 mg total) by mouth daily as needed for erectile dysfunction.   Tiotropium Bromide-Olodaterol (STIOLTO RESPIMAT) 2.5-2.5 MCG/ACT AERS Inhale 2 puffs into the lungs daily.   torsemide (DEMADEX) 20 MG tablet Take 1 tablet (20 mg) by mouth on M, W, F.  You may take 1 extra tablet (20 mg) by mouth once daily as needed for swelling/ weight gain/ shortness of breath   No facility-administered encounter medications on file as of 01/04/2022.    Patient Active Problem List   Diagnosis Date Noted   Thrombocytopenia ([place])    Sepsis due to group A Streptococcus with acute renal failure and septic shock (HCC)    Left arm cellulitis    Transaminitis    Acute kidney injury superimposed on CKD ([place])    Septic shock ([place]) 07/29/2021   Genetic testing 02/02/2021   Family history of lung cancer 01/23/2021   Personal history of colonic polyps 01/23/2021  History of prostate surgery 06/09/2020   Erectile dysfunction 06/09/2020   Healthcare maintenance 03/27/2020   Pain of left heel 03/03/2020   Obesity (BMI 30-39.9) 03/03/2020   Aortic atherosclerosis ([place]) 02/20/2020   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   Essential hypertension 10/13/2019   Unstable angina (HCC)    Carotid artery stenosis 06/10/2019   Baker's cyst 06/10/2019   Bilateral impacted cerumen 72/53/6644   Chronic systolic CHF (congestive  heart failure) ([place]) 06/10/2019   Scoliosis 06/10/2019   Anxiety 04/15/2019   Chronic obstructive pulmonary disease ([place]) 04/15/2019   COPD exacerbation ([place]) 09/08/2018   Chest tightness 02/12/2018   Pain and swelling of left lower extremity 02/12/2018   Sacroiliac pain 07/31/2016   Acute on chronic systolic CHF (congestive heart failure) ([place]) 06/27/2016   Hypokalemia 06/25/2016   NSTEMI (non-ST elevated myocardial infarction) ([place]) 06/23/2016   Acute exacerbation of CHF (congestive heart failure) ([place]) 06/22/2016   DDD (degenerative disc disease), lumbar 06/13/2016   Tobacco abuse 06/13/2016   Dizziness 03/30/2015   Former smoker 09/29/2014   Syncope 06/01/2014   Insomnia 06/01/2014   Ischemic cardiomyopathy    Ventricular tachycardia ([place]) 01/23/2012   Single implantable cardioverter-defibrillator-BSx 12/11/2011   SYNCOPE AND COLLAPSE 09/26/2010   Hyperlipidemia 02/23/2010   Hypotension 02/23/2010   CAD (coronary artery disease) 02/23/2010    Conditions to be addressed/monitored:CHF and arm wound  Care Plan : [place] (Adult)  Updates made by Leona Singleton, RN since 01/04/2022 12:00 AM     Problem: KNOWLEDGE DEFICIT RELATED TO SELF CARE MANAGEMENT OF CHRONIC CONDITIONS   Priority: Medium     Long-Range Goal: Patient will work with CCM team to gain knowledge of chroic conditions   Start Date: 09/05/2021  Expected End Date: 09/05/2021  Priority: Medium  Note:   Current Barriers:  Knowledge Deficits related to plan of care for management of CHF and Left arm wound;   Patient with left arm wound after sepsis episode January 2023;  underwent skin grafting 2 weeks ago.  States he does his own dressing changes, declined home health and provider aware.  Denies any issues with ski graft area.  Weight 196 pounds.  BP 110-120/50-60's 3/31--left arm wound doing well; patient applying wet to dry dressing changes at this time.  Denies any pain, sob lower extremity  edema.  Sid notice increase in weight.  attended appointment with cardiology and medications adjusted based on weight.  Patient reports taking new doses of medication and an additional dose of Demadex every other day. WEIGHT DECREASED TO 204 POUNDS.  Has not been checking bp at home 5/3--Doing well.  Denies any chest pains, SOB, or swelling.  Weight yesterday was 201 pounds, still needs to weigh today.  Able to verbalize medication adjustments correctly.  Reports arm wound is healing well, no open areas.  Still has not been checking bp at home. 6/9--reports he does not feel well, has the "sniffles", states they are getting better with use of Claritin and plans to get OTC nasal spray.  Denies any chest pains, SOB, Dizziness or Swelling.  Does report slight nonproductive cough.  Weight today was 201, within his range of 198-201.  Reports arm wound is healed.  RNCM Clinical Goal(s):  Patient will demonstrate improved health management independence as evidenced by daily weight monitoring and no sigs symptoms recurrent infection        through collaboration with RN Care manager, provider, and care team.   Interventions: 1:1 collaboration  with primary care provider regarding development and update of comprehensive plan of care as evidenced by provider attestation and co-signature Inter-disciplinary care team collaboration (see longitudinal plan of care) Evaluation of current treatment plan related to  self management and patient's adherence to plan as established by provider   Heart Failure Interventions:  (Status: Goal on track: NO.)  Long Term Goal  Wt Readings from Last 3 Encounters:  11/02/21 201 lb (91.2 kg)  10/22/21 207 lb 2 oz (94 kg)  09/18/21 200 lb (90.7 kg)   Basic overview and discussion of pathophysiology of Heart Failure reviewed Provided education on low sodium diet Reviewed Heart Failure Action Plan in depth and provided written copy Advised patient to weigh each morning after  emptying bladder Discussed importance of daily weight and advised patient to weigh and record daily Reviewed role of diuretics in prevention of fluid overload and management of heart failure Discussed the importance of keeping all appointments with provider Provided patient with education about the role of exercise in the management of heart failure Encouraged to monitor BP at least 3 times a week  Verified patient taking medications as adjusted by Cardiologist Encouraged to  contact provider if cold continues over the next week   Left Arm Wound  (Status: Goal on Track (progressing): YES.) Long Term Goal  Evaluation of current treatment plan related to  arm wound , Level of care concerns self-management and patient's adherence to plan as established by provider. Discussed plans with patient for ongoing care management follow up and provided patient with direct contact information for care management team Provided education to patient re: monitoring wound for infection; Discussed plans with patient for ongoing care management follow up and provided patient with direct contact information for care management team; Verified wound continues to heal; no open areas  Patient Goals/Self-Care Activities: Take medications as prescribed   Attend all scheduled provider appointments Perform all self care activities independently  Perform IADL's (shopping, preparing meals, housekeeping, managing finances) independently call office if I gain more than 2 pounds in one day or 5 pounds in one week use salt in moderation watch for swelling in feet, ankles and legs every day weigh myself daily bring diary to all appointments develop a rescue plan follow rescue plan if symptoms flare-up know when to call the doctor based on weight, heart failure symptoms, wound assessment       Plan:The care management team will reach out to the patient again over the next 45 days.  Hubert Azure RN, MSN RN Care  Management Coordinator [place] [phone] [email or web address]

## 2022-01-04 NOTE — Patient Instructions (Addendum)
Visit Information  Thank you for taking time to visit with me today. Please don't hesitate to contact me if I can be of assistance to you before our next scheduled telephone appointment.  Following are the goals we discussed today:  Take medications as prescribed   Attend all scheduled provider appointments Perform all self care activities independently  Perform IADL's (shopping, preparing meals, housekeeping, managing finances) independently call office if I gain more than 2 pounds in one day or 5 pounds in one week use salt in moderation watch for swelling in feet, ankles and legs every day weigh myself daily bring diary to all appointments develop a rescue plan follow rescue plan if symptoms flare-up know when to call the doctor based on weight, heart failure symptoms, wound assessment  Our next appointment is by telephone on 7/17 at 1100  Please call the care guide team at [phone] if you need to cancel or reschedule your appointment.   If you are experiencing a Mental Health or [place] or need someone to talk to, please call the Suicide and Crisis Lifeline: 988 call the [place] National Suicide Prevention Lifeline: 6700416653 or TTY: 770-084-7186 TTY (901)194-0507) to talk to a trained counselor call 1-800-273-TALK (toll free, 24 hour hotline) call 911   The patient verbalized understanding of instructions, educational materials, and care plan provided today and DECLINED offer to receive copy of patient instructions, educational materials, and care plan.   Hubert Azure RN, MSN RN Care Management Coordinator [place] [phone] [email or web address]

## 2022-01-10 ENCOUNTER — Other Ambulatory Visit: Payer: Self-pay | Admitting: Internal Medicine

## 2022-01-10 DIAGNOSIS — F419 Anxiety disorder, unspecified: Secondary | ICD-10-CM

## 2022-01-25 DIAGNOSIS — I5042 Chronic combined systolic (congestive) and diastolic (congestive) heart failure: Secondary | ICD-10-CM

## 2022-01-25 DIAGNOSIS — I10 Essential (primary) hypertension: Secondary | ICD-10-CM

## 2022-01-25 DIAGNOSIS — J449 Chronic obstructive pulmonary disease, unspecified: Secondary | ICD-10-CM

## 2022-02-08 ENCOUNTER — Ambulatory Visit (INDEPENDENT_AMBULATORY_CARE_PROVIDER_SITE_OTHER): Payer: Medicare Other | Admitting: Internal Medicine

## 2022-02-08 ENCOUNTER — Telehealth: Payer: Self-pay

## 2022-02-08 ENCOUNTER — Encounter: Payer: Self-pay | Admitting: Internal Medicine

## 2022-02-08 DIAGNOSIS — E876 Hypokalemia: Secondary | ICD-10-CM

## 2022-02-08 DIAGNOSIS — I10 Essential (primary) hypertension: Secondary | ICD-10-CM | POA: Diagnosis not present

## 2022-02-08 DIAGNOSIS — I25118 Atherosclerotic heart disease of native coronary artery with other forms of angina pectoris: Secondary | ICD-10-CM

## 2022-02-08 DIAGNOSIS — E782 Mixed hyperlipidemia: Secondary | ICD-10-CM

## 2022-02-08 DIAGNOSIS — E785 Hyperlipidemia, unspecified: Secondary | ICD-10-CM

## 2022-02-08 DIAGNOSIS — D649 Anemia, unspecified: Secondary | ICD-10-CM

## 2022-02-08 DIAGNOSIS — F419 Anxiety disorder, unspecified: Secondary | ICD-10-CM

## 2022-02-08 DIAGNOSIS — J0110 Acute frontal sinusitis, unspecified: Secondary | ICD-10-CM

## 2022-02-08 DIAGNOSIS — E559 Vitamin D deficiency, unspecified: Secondary | ICD-10-CM

## 2022-02-08 DIAGNOSIS — T148XXA Other injury of unspecified body region, initial encounter: Secondary | ICD-10-CM | POA: Diagnosis not present

## 2022-02-08 DIAGNOSIS — N1831 Chronic kidney disease, stage 3a: Secondary | ICD-10-CM

## 2022-02-08 DIAGNOSIS — J441 Chronic obstructive pulmonary disease with (acute) exacerbation: Secondary | ICD-10-CM

## 2022-02-08 MED ORDER — CARVEDILOL 3.125 MG PO TABS: 3.1250 mg | ORAL_TABLET | Freq: Two times a day (BID) | ORAL | 3 refills | Status: DC

## 2022-02-08 MED ORDER — AMIODARONE HCL 200 MG PO TABS: 200.0000 mg | ORAL_TABLET | Freq: Every day | ORAL | 0 refills | Status: DC

## 2022-02-08 MED ORDER — TORSEMIDE 20 MG PO TABS: ORAL_TABLET | ORAL | 5 refills | Status: DC

## 2022-02-08 MED ORDER — SPIRONOLACTONE 25 MG PO TABS: 25.0000 mg | ORAL_TABLET | Freq: Every day | ORAL | 3 refills | Status: DC

## 2022-02-08 MED ORDER — EZETIMIBE 10 MG PO TABS: 10.0000 mg | ORAL_TABLET | Freq: Every day | ORAL | 3 refills | Status: DC

## 2022-02-08 MED ORDER — ALBUTEROL SULFATE HFA 108 (90 BASE) MCG/ACT IN AERS: 1.0000 | INHALATION_SPRAY | Freq: Four times a day (QID) | RESPIRATORY_TRACT | 11 refills | Status: DC | PRN

## 2022-02-08 MED ORDER — ALPRAZOLAM 0.5 MG PO TABS: 0.5000 mg | ORAL_TABLET | Freq: Three times a day (TID) | ORAL | 2 refills | Status: DC | PRN

## 2022-02-08 MED ORDER — MUPIROCIN 2 % EX OINT: 1.0000 | TOPICAL_OINTMENT | Freq: Two times a day (BID) | CUTANEOUS | 2 refills | Status: DC

## 2022-02-08 MED ORDER — STIOLTO RESPIMAT 2.5-2.5 MCG/ACT IN AERS: 2.0000 | INHALATION_SPRAY | Freq: Every day | RESPIRATORY_TRACT | 11 refills | Status: DC

## 2022-02-08 MED ORDER — POTASSIUM CHLORIDE ER 10 MEQ PO TBCR: 10.0000 meq | EXTENDED_RELEASE_TABLET | Freq: Every day | ORAL | 3 refills | Status: DC

## 2022-02-08 MED ORDER — ATORVASTATIN CALCIUM 80 MG PO TABS: 80.0000 mg | ORAL_TABLET | Freq: Every day | ORAL | 3 refills | Status: DC

## 2022-02-08 MED ORDER — AMOXICILLIN-POT CLAVULANATE 875-125 MG PO TABS: 1.0000 | ORAL_TABLET | Freq: Two times a day (BID) | ORAL | 0 refills | Status: DC

## 2022-02-08 MED ORDER — CLOPIDOGREL BISULFATE 75 MG PO TABS: 75.0000 mg | ORAL_TABLET | Freq: Every day | ORAL | 3 refills | Status: DC

## 2022-02-08 MED ORDER — IPRATROPIUM-ALBUTEROL 0.5-2.5 (3) MG/3ML IN SOLN: 3.0000 mL | Freq: Four times a day (QID) | RESPIRATORY_TRACT | 11 refills | Status: DC | PRN

## 2022-02-08 NOTE — Progress Notes (Signed)
Telephone Note  I connected with Ian Mills   on 02/08/22 at 11:00 AM EDT by telephone and verified that I am speaking with the correct person using two identifiers.  Location patient: [place] Location provider:work or home office Persons participating in the virtual visit: patient, provider  I discussed the limitations and requested verbal permission for telemedicine visit. The patient expressed understanding and agreed to proceed.   HPI:  Acute telemedicine visit for : Sick visit c/o cold sx's nasal drainage, sneezing, dry cough x 2 weeks tried otc nasal spray claritin w/o relief not had covid testing [place] he is wondering if he has sinus infection  Needs refills of all meds  -Pertinent past medical history: see below -Pertinent medication allergies: Allergies  Allergen Reactions   Nitroglycerin Nausea Only and Other (See Comments)    Per patient "causes severe  headache"   -COVID-19 vaccine status:  Immunization History  Administered Date(s) Administered   Fluad Quad(high Dose 65+) 06/10/2019, 06/09/2020   Influenza,inj,Quad PF,6+ Mos 06/26/2016   Influenza-Unspecified 06/29/2015   PFIZER(Purple Top)SARS-COV-2 Vaccination 12/22/2019, 01/12/2020, 06/02/2020   Pneumococcal Polysaccharide-23 06/29/2015, 09/09/2018   Tdap 04/22/2019, 03/03/2020     ROS: See pertinent positives and negatives per HPI.  Past Medical History:  Diagnosis Date   Acute anterior wall MI ([place]) 07/04/1991   a.) LHC: EF 29%; 100% mLAD -> PTCA performed   Anxiety    Aortic atherosclerosis (HCC)    CHF (congestive heart failure) (HCC)    COPD (chronic obstructive pulmonary disease) (HCC)    Coronary artery disease    a.) 3 stents --> 3x18 mm Duet to OM3, 2.5x18 mm Duet to D1, 2.5x13 mm Duet to RPDA. b. 06/2013 PCI: CTO LAD, patent LAD stent, RCA 70p (FFR 0.74--> DES), 27m(DES), RPDA 50%, EF 20%; c. 08/2019 Cath: LM nl, LAD 100p/m, D1 60, D2 patent stent,  LCX 60ost/p, 481mSR, RCA 30p, 2050mR.   Family history of lung cancer    HFrEF (heart failure with reduced ejection fraction) ([place]  a. 05/2014 Echo: EF 25-30%; b. 01/2018 Echo ([facility]): EF 30%; c. 06/2021 Echo: EF 25-30%, apical AK, GrI DD, nl RV fxn.   HLD (hyperlipidemia)    HTN (hypertension)    ICD  single,BSX    a. DOI 11/2011; b. S/N# [id number] cardiomyopathy    a. 05/2014 Echo: EF 25-30%; b. 01/2018 Echo ([facility]): EF 30%, c. 06/2021 Echo: EF 25-30%.   Lateral wall myocardial infarction ([place]CC[place]2/08/1996   a.) LHC: EF 44%; 75% RPDA and 95% OM3; PCI performed and a 3.0 x 18 mm Duet stent placed to OM3[place] current use of antithrombotics/antiplatelets    a.) DAPT therapy (ASA + clopidogrel)   NSTEMI (non-ST elevated myocardial infarction) ([place]CC[place]5/07/2011   a.) LHC: 100% mLAD; intervention deferred opting for medical management.   Personal history of colonic polyps    Pneumonia 08/2018   Sepsis ([place]CC[place]1/07/2021   Syncope and collapse    Ventricular tachycardia (HCCFlorala5/11/2011   a.) VT arrest; ROSC achieved --> BPoplaraced.    Past Surgical History:  Procedure Laterality Date   APPENDECTOMY N/A    CARDIAC DEFIBRILLATOR PLACEMENT N/A 12/01/2011   COLONOSCOPY WITH PROPOFOL N/A 12/22/2020   Procedure: COLONOSCOPY WITH PROPOFOL;  Surgeon: AnnJonathon BellowsD;  [facility];  Service: Gastroenterology;  Laterality: N/A;   CORONARY ANGIOPLASTY Left 07/05/1991   Procedure:  CORONARY ANGIOPLASTY; Location: [facility]; Surgeon: Doristine Bosworth, MD   CORONARY ANGIOPLASTY Left 08/06/1991   Procedure: CORONARY ANGIOPLASTY; Location: [facility]; Surgeon: Ellouise Newer, MD   CORONARY ANGIOPLASTY Left 08/09/1991   Procedure: CORONARY ANGIOPLASTY; Location: [facility]; Surgeon: Ellouise Newer, MD   CORONARY ANGIOPLASTY Left 10/28/1991   Procedure: CORONARY ANGIOPLASTY; Location: [facility]; Surgeon: Ellouise Newer, MD   CORONARY ANGIOPLASTY Left 11/02/1991   Procedure: CORONARY  ANGIOPLASTY; Location: [facility]; Surgeon: Ellouise Newer, MD   CORONARY ANGIOPLASTY Left 01/10/1992   Procedure: CORONARY ANGIOPLASTY; Location: [facility]; Surgeon: Carles Collet, MD   CORONARY ANGIOPLASTY Left 09/26/1993   Procedure: CORONARY ANGIOPLASTY; Location: [facility]; Surgeon: Melton Alar, MD   CORONARY ANGIOPLASTY Left 09/11/2006   Procedure: CORONARY ANGIOPLASTY; Location: [facility]   CORONARY ANGIOPLASTY Left 01/17/2010   Procedure: CORONARY ANGIOPLASTY; Location: [facility]; Surgeon: Wendee Beavers, MD   CORONARY ANGIOPLASTY Left 11/27/2011   Procedure: CORONARY ANGIOPLASTY; Location: [facility]; Surgeon: Derinda Sis, MD   CORONARY ANGIOPLASTY WITH STENT PLACEMENT Left 06/29/1997   Procedure: CORONARY ANGIOPLASTY WITH STENT PLACEMENT (3.0 x 18 mm Diet stent to OM3); Location: [facility]; Surgeon: Allie Bossier, MD   CORONARY ANGIOPLASTY WITH STENT PLACEMENT Left 07/12/1998   Procedure: CORONARY ANGIOPLASTY WITH STENT PLACEMENT (2.8 x 8 mm Duet stent to D1, 2.5 x 13 mm Duet stent to RPDA); Location: [facility]   GRAFT APPLICATION Left 5/42/7062   Procedure: GRAFT APPLICATION;  Surgeon: Benjamine Sprague, DO;  Location: [facility];  Service: General;  Laterality: Left;   LEFT HEART CATH AND CORONARY ANGIOGRAPHY Left 08/30/2019   Procedure: LEFT HEART CATH AND CORONARY ANGIOGRAPHY;  Surgeon: Wellington Hampshire, MD;  Location: [facility];  Service: Cardiovascular;  Laterality: Left;   PROSTATE SURGERY     urethra grew around prostate    TUMOR REMOVAL  2001   chest thymic cyst; behind lungs [facility] benign      Current Outpatient Medications:    amoxicillin-clavulanate (AUGMENTIN) 875-125 MG tablet, Take 1 tablet by mouth 2 (two) times daily. With food, Disp: 20 tablet, Rfl: 0   aspirin EC 81 MG tablet, Take 81 mg by mouth daily. Swallow whole., Disp: , Rfl:    mupirocin ointment (BACTROBAN) 2 %, Apply 1 Application topically 2 (two) times daily., Disp: 30 g, Rfl: 2   sacubitril-valsartan (ENTRESTO) 97-103 MG, Take 1 tablet  by mouth 2 (two) times daily., Disp: 180 tablet, Rfl: 0   tadalafil (CIALIS) 10 MG tablet, Take 1-2 tablets (10-20 mg total) by mouth daily as needed for erectile dysfunction., Disp: 30 tablet, Rfl: 11   albuterol (VENTOLIN HFA) 108 (90 Base) MCG/ACT inhaler, Inhale 1-2 puffs into the lungs every 6 (six) hours as needed for wheezing or shortness of breath., Disp: 18 g, Rfl: 11   ALPRAZolam (XANAX) 0.5 MG tablet, Take 1 tablet (0.5 mg total) by mouth 3 (three) times daily as needed. for anxiety, Disp: 90 tablet, Rfl: 2   amiodarone (PACERONE) 200 MG tablet, Take 1 tablet (200 mg total) by mouth daily., Disp: 90 tablet, Rfl: 0   atorvastatin (LIPITOR) 80 MG tablet, Take 1 tablet (80 mg total) by mouth daily., Disp: 90 tablet, Rfl: 3   carvedilol (COREG) 3.125 MG tablet, Take 1 tablet (3.125 mg total) by mouth 2 (two) times daily., Disp: 180 tablet, Rfl: 3   clopidogrel (PLAVIX) 75 MG tablet, Take 1 tablet (75 mg total) by mouth daily., Disp: 90 tablet, Rfl: 3   ezetimibe (ZETIA) 10 MG tablet, Take 1 tablet (10 mg total) by mouth daily., Disp: 90 tablet,  Rfl: 3   ipratropium-albuterol (DUONEB) 0.5-2.5 (3) MG/3ML SOLN, Take 3 mLs by nebulization every 6 (six) hours as needed., Disp: 360 mL, Rfl: 11   potassium chloride (KLOR-CON 10) 10 MEQ tablet, Take 1 tablet (10 mEq total) by mouth daily., Disp: 90 tablet, Rfl: 3   spironolactone (ALDACTONE) 25 MG tablet, Take 1 tablet (25 mg total) by mouth daily., Disp: 90 tablet, Rfl: 3   Tiotropium Bromide-Olodaterol (STIOLTO RESPIMAT) 2.5-2.5 MCG/ACT AERS, Inhale 2 puffs into the lungs daily., Disp: 4 g, Rfl: 11   torsemide (DEMADEX) 20 MG tablet, Take 1 tablet (20 mg) by mouth on M, W, F.  You may take 1 extra tablet (20 mg) by mouth once daily as needed for swelling/ weight gain/ shortness of breath, Disp: 135 tablet, Rfl: 5  EXAM:  VITALS per patient if applicable:  GENERAL: alert, oriented, appears well and in no acute distress   PSYCH/NEURO: pleasant  and cooperative, no obvious depression or anxiety, speech and thought processing grossly intact  ASSESSMENT AND PLAN:  Discussed the following assessment and plan:  COPD exacerbation (HCC) Acute frontal sinusitis, recurrence not specified - Plan: amoxicillin-clavulanate (AUGMENTIN) 875-125 MG tablet bid x 7 days   Anxiety - Plan: ALPRAZolam (XANAX) 0.5 MG tablet  Mixed hyperlipidemia - Plan: atorvastatin (LIPITOR) 80 MG tablet  Essential hypertension - Plan: atorvastatin (LIPITOR) 80 MG tablet, carvedilol (COREG) 3.125 MG tablet  Coronary artery disease of native artery of native heart with stable angina pectoris ([place]) - Plan: carvedilol (COREG) 3.125 MG tablet  Hyperlipidemia, unspecified hyperlipidemia type - Plan: ezetimibe (ZETIA) 10 MG tablet  Hypokalemia - Plan: potassium chloride (KLOR-CON 10) 10 MEQ tablet  Open wounds - Plan: mupirocin ointment (BACTROBAN) 2 %  HM -fasting labs Flu shot had in 2021 pna 23 utd prevnar 20 due in future covid vaccine 3/3 pfizer consider booster   Tdap utd 03/03/20  Consider shingrix in the future    hcv 06/29/97 negative   Skin no issues   Colonoscopy [facility]  -5/22 and 6/22 egd colonoscopy   PSA sees Dr. Vernice [facility] urology appt sch 05/2020 06/09/20 0.35 -referred [facility] Dr. Diamantina [place] f/u 06/2021    Smoker CT chest 01/19/19 COPD still smoking rec cessation   smoking <1 ppd some times <1/2 ppd or 5-6 cig qd since age 52 y.o smoker and quit x 5 year FH lung cancer in mother trying to get help with chantix  As of 12/28/19 quit smoking x 5 weeks congratulated    02/16/20 CT chest  IMPRESSION: 1. Lung-RADS 2, benign appearance or behavior. Continue annual screening with low-dose chest CT without contrast in 12 months. 2. Aortic atherosclerosis (ICD10-I70.0). Coronary artery calcification. 3.  Emphysema (ICD10-J43.9   06/29/2019 [facility] cerumen impaction hearing loss with hearing aids improved after wax removal       Cards Dr. Rockey Situ and EP Dr. Jolyn Nap , CHF clinic    -we discussed possible serious and likely etiologies, options for evaluation and workup, limitations of telemedicine visit vs in person visit, treatment, treatment risks and precautions. Pt is agreeable to treatment via telemedicine at this moment.    I discussed the assessment and treatment plan with the patient. The patient was provided an opportunity to ask questions and all were answered. The patient agreed with the plan and demonstrated an understanding of the instructions.    Time spent 11 minutes  Delorise Jackson, MD

## 2022-02-08 NOTE — Telephone Encounter (Signed)
PA has been started via covermymeds on 02/08/22 for pt's Stiolto Respimat 2.5-2.5MCG/ACT aerosol  YHT:[id number] Rx #:[id number]  Awaiting denial or approval

## 2022-02-11 ENCOUNTER — Ambulatory Visit (INDEPENDENT_AMBULATORY_CARE_PROVIDER_SITE_OTHER): Payer: Medicare Other | Admitting: *Deleted

## 2022-02-11 ENCOUNTER — Telehealth: Payer: Self-pay | Admitting: Internal Medicine

## 2022-02-11 DIAGNOSIS — I5022 Chronic systolic (congestive) heart failure: Secondary | ICD-10-CM

## 2022-02-11 DIAGNOSIS — I10 Essential (primary) hypertension: Secondary | ICD-10-CM

## 2022-02-11 NOTE — Patient Instructions (Signed)
CONGRATULATIONS ON COMPLETING YOUR GOALS.  IT AS BEEN A PLEASURE WORKING WITH AND TALKING TO YOU.  IF  NEEDS [place] IN THE FUTURE PLEASE DO NOT HESITATE TO CONTACT ME  [phone]  Judithe Keetch RN, MSN RN Care Management Coordinator [facility] [phone] [email or web address]  

## 2022-02-11 NOTE — Telephone Encounter (Signed)
Patient came into office to have labs done. There are no orders for labs, does patient need labs?

## 2022-02-11 NOTE — Chronic Care Management (AMB) (Signed)
  Care Management   Follow Up Note   02/11/2022 Name: Ian Mills MRN: [id number] DOB: September 26, 1955   Referred by: McLean-Scocuzza, Nino Glow, MD Reason for referral : Case Closure   Successful outreach to patient.  States she is doing well without complaints.  Discussed goals and both agree patient has met goals of the program.  Follow Up Plan: The patient has been provided with contact information for the care management team and has been advised to call with any health-related questions or concerns.  No further follow up required: as personal goals have been met.  Hubert Azure RN, MSN RN Care Management Coordinator [place] [phone] [email or web address]

## 2022-02-20 DIAGNOSIS — N1831 Chronic kidney disease, stage 3a: Secondary | ICD-10-CM | POA: Insufficient documentation

## 2022-02-20 NOTE — Telephone Encounter (Signed)
Sch fasting labs please orders in

## 2022-02-25 DIAGNOSIS — I5022 Chronic systolic (congestive) heart failure: Secondary | ICD-10-CM

## 2022-02-25 DIAGNOSIS — I10 Essential (primary) hypertension: Secondary | ICD-10-CM | POA: Diagnosis not present

## 2022-02-26 ENCOUNTER — Other Ambulatory Visit: Payer: Medicare Other

## 2022-02-27 ENCOUNTER — Other Ambulatory Visit (INDEPENDENT_AMBULATORY_CARE_PROVIDER_SITE_OTHER): Payer: Medicare Other

## 2022-02-27 DIAGNOSIS — I10 Essential (primary) hypertension: Secondary | ICD-10-CM

## 2022-02-27 DIAGNOSIS — E785 Hyperlipidemia, unspecified: Secondary | ICD-10-CM | POA: Diagnosis not present

## 2022-02-27 DIAGNOSIS — D649 Anemia, unspecified: Secondary | ICD-10-CM | POA: Diagnosis not present

## 2022-02-27 DIAGNOSIS — E782 Mixed hyperlipidemia: Secondary | ICD-10-CM

## 2022-02-27 DIAGNOSIS — N1831 Chronic kidney disease, stage 3a: Secondary | ICD-10-CM | POA: Diagnosis not present

## 2022-02-27 DIAGNOSIS — E559 Vitamin D deficiency, unspecified: Secondary | ICD-10-CM

## 2022-02-28 LAB — CBC WITH DIFFERENTIAL/PLATELET
Basophils Absolute: 0.1 10*3/uL (ref 0.0–0.1)
Basophils Relative: 1.2 % (ref 0.0–3.0)
Eosinophils Absolute: 0.3 10*3/uL (ref 0.0–0.7)
Eosinophils Relative: 4.2 % (ref 0.0–5.0)
HCT: 41.8 % (ref 39.0–52.0)
Hemoglobin: 14.1 g/dL (ref 13.0–17.0)
Lymphocytes Relative: 46.6 % — ABNORMAL HIGH (ref 12.0–46.0)
Lymphs Abs: 3.1 10*3/uL (ref 0.7–4.0)
MCHC: 33.6 g/dL (ref 30.0–36.0)
MCV: 89.4 fl (ref 78.0–100.0)
Monocytes Absolute: 0.8 10*3/uL (ref 0.1–1.0)
Monocytes Relative: 11.4 % (ref 3.0–12.0)
Neutro Abs: 2.4 10*3/uL (ref 1.4–7.7)
Neutrophils Relative %: 36.6 % — ABNORMAL LOW (ref 43.0–77.0)
Platelets: 167 10*3/uL (ref 150.0–400.0)
RBC: 4.68 Mil/uL (ref 4.22–5.81)
RDW: 15.1 % (ref 11.5–15.5)
WBC: 6.6 10*3/uL (ref 4.0–10.5)

## 2022-02-28 LAB — COMPREHENSIVE METABOLIC PANEL
ALT: 20 U/L (ref 0–53)
AST: 13 U/L (ref 0–37)
Albumin: 4.2 g/dL (ref 3.5–5.2)
Alkaline Phosphatase: 83 U/L (ref 39–117)
BUN: 30 mg/dL — ABNORMAL HIGH (ref 6–23)
CO2: 23 mEq/L (ref 19–32)
Calcium: 9 mg/dL (ref 8.4–10.5)
Chloride: 104 mEq/L (ref 96–112)
Creatinine, Ser: 1.38 mg/dL (ref 0.40–1.50)
GFR: 52.53 mL/min — ABNORMAL LOW (ref >60.00)
Glucose, Bld: 87 mg/dL (ref 70–99)
Potassium: 4.5 mEq/L (ref 3.5–5.1)
Sodium: 135 mEq/L (ref 135–145)
Total Bilirubin: 0.5 mg/dL (ref 0.2–1.2)
Total Protein: 6.8 g/dL (ref 6.0–8.3)

## 2022-02-28 LAB — LIPID PANEL
Cholesterol: 122 mg/dL (ref 0–200)
HDL: 38.3 mg/dL — ABNORMAL LOW (ref >39.00)
LDL Cholesterol: 66 mg/dL (ref 0–99)
NonHDL: 83.6
Total CHOL/HDL Ratio: 3
Triglycerides: 90 mg/dL (ref 0.0–149.0)
VLDL: 18 mg/dL (ref 0.0–40.0)

## 2022-02-28 LAB — VITAMIN D 25 HYDROXY (VIT D DEFICIENCY, FRACTURES): VITD: 28.31 ng/mL — ABNORMAL LOW (ref 30.00–100.00)

## 2022-02-28 LAB — IBC + FERRITIN
Ferritin: 96.5 ng/mL (ref 22.0–322.0)
Iron: 119 ug/dL (ref 42–165)
Saturation Ratios: 35 % (ref 20.0–50.0)
TIBC: 340.2 ug/dL (ref 250.0–450.0)
Transferrin: 243 mg/dL (ref 212.0–360.0)

## 2022-03-09 NOTE — Progress Notes (Deleted)
Patient ID: Ian Mills, male    DOB: 04/08/1956, 66 y.o.   MRN: [id number]  HPI  [name] is a 66 y/o male with a history of CAD, hyperlipidemia, HTN, anxiety, aortic atherosclerosis, COPD, VT (AICD) previous tobacco use and chronic heart failure.   Echo report from 07/12/21 reviewed and showed an EF of 25-30%  LHC done 08/30/19 showed: Prox RCA to Mid RCA lesion is 20% stenosed. Prox RCA lesion is 30% stenosed. Prox LAD to Mid LAD lesion is 100% stenosed. 1st Diag-1 lesion is 60% stenosed. Previously placed 1st Diag-2 stent (unknown type) is widely patent. Ost Cx to Prox Cx lesion is 60% stenosed. Mid Cx lesion is 40% stenosed.   1. Significant underlying three-vessel coronary artery disease with chronically occluded mid LAD, patent first diagonal stent, patent RCA stents with mild in-stent restenosis, patent left circumflex stent with moderate in-stent restenosis.  Progression of ostial left circumflex stenosis to 60%. 2.  Left ventricular angiography was not performed due to chronic kidney disease. 3.  Moderately elevated left ventricular end-diastolic pressure between 25 to 30 mmHg.  Has not been admitted or been in the ED in the last 6 months.  He presents today for a follow-up visit with a chief complaint of  Past Medical History:  Diagnosis Date   Acute anterior wall MI ([place]) 07/04/1991   a.) LHC: EF 29%; 100% mLAD -> PTCA performed   Anxiety    Aortic atherosclerosis (HCC)    CHF (congestive heart failure) (HCC)    COPD (chronic obstructive pulmonary disease) (HCC)    Coronary artery disease    a.) 3 stents --> 3x18 mm Duet to OM3, 2.5x18 mm Duet to D1, 2.5x13 mm Duet to RPDA. b. 06/2013 PCI: CTO LAD, patent LAD stent, RCA 70p (FFR 0.74--> DES), 45m(DES), RPDA 50%, EF 20%; c. 08/2019 Cath: LM nl, LAD 100p/m, D1 60, D2 patent stent, LCX 60ost/p, 43mSR, RCA 30p, 2085mR.   Family history of lung cancer    HFrEF (heart failure with reduced ejection fraction) ([place]   a. 05/2014 Echo: EF 25-30%; b. 01/2018 Echo ([facility]): EF 30%; c. 06/2021 Echo: EF 25-30%, apical AK, GrI DD, nl RV fxn.   HLD (hyperlipidemia)    HTN (hypertension)    ICD  single,BSX    a. DOI 11/2011; b. S/N# [id number] cardiomyopathy    a. 05/2014 Echo: EF 25-30%; b. 01/2018 Echo ([facility]): EF 30%, c. 06/2021 Echo: EF 25-30%.   Lateral wall myocardial infarction ([place]/08/1996   a.) LHC: EF 44%; 75% RPDA and 95% OM3; PCI performed and a 3.0 x 18 mm Duet stent placed to [place] current use of antithrombotics/antiplatelets    a.) DAPT therapy (ASA + clopidogrel)   NSTEMI (non-ST elevated myocardial infarction) ([place]CC[place]/07/[place]   a.) LHC: 100% mLAD; intervention deferred opting for medical management.   Personal history of colonic polyps    Pneumonia 08/2018   Sepsis ([place]CC[place]1/07/2021   Syncope and collapse    Ventricular tachycardia ([place]CC[place]5/11/2011   a.) VT arrest; ROSC achieved --> [place]inetop Country Club[place].   Past Surgical History:  Procedure Laterality Date   APPENDECTOMY N/A    CARDIAC DEFIBRILLATOR PLACEMENT N/A 12/01/2011   COLONOSCOPY WITH PROPOFOL N/A 12/22/2020   Procedure: COLONOSCOPY WITH PROPOFOL;  Surgeon: AnnJonathon BellowsD;  [facility];  Service: Gastroenterology;  Laterality: N/A;   CORONARY ANGIOPLASTY Left 07/05/1991   Procedure: CORONARY ANGIOPLASTY; Location:  [facility]; Surgeon: Doristine Bosworth, MD   CORONARY ANGIOPLASTY Left 08/06/1991   Procedure: CORONARY ANGIOPLASTY; Location: [facility]; Surgeon: Ellouise Newer, MD   CORONARY ANGIOPLASTY Left 08/09/1991   Procedure: CORONARY ANGIOPLASTY; Location: [facility]; Surgeon: Ellouise Newer, MD   CORONARY ANGIOPLASTY Left 10/28/1991   Procedure: CORONARY ANGIOPLASTY; Location: [facility]; Surgeon: Ellouise Newer, MD   CORONARY ANGIOPLASTY Left 11/02/1991   Procedure: CORONARY ANGIOPLASTY; Location: [facility]; Surgeon: Ellouise Newer, MD   CORONARY ANGIOPLASTY Left 01/10/1992   Procedure: CORONARY ANGIOPLASTY; Location: [facility];  Surgeon: Carles Collet, MD   CORONARY ANGIOPLASTY Left 09/26/1993   Procedure: CORONARY ANGIOPLASTY; Location: [facility]; Surgeon: Melton Alar, MD   CORONARY ANGIOPLASTY Left 09/11/2006   Procedure: CORONARY ANGIOPLASTY; Location: [facility]   CORONARY ANGIOPLASTY Left 01/17/2010   Procedure: CORONARY ANGIOPLASTY; Location: [facility]; Surgeon: Wendee Beavers, MD   CORONARY ANGIOPLASTY Left 11/27/2011   Procedure: CORONARY ANGIOPLASTY; Location: [facility]; Surgeon: Derinda Sis, MD   CORONARY ANGIOPLASTY WITH STENT PLACEMENT Left 06/29/1997   Procedure: CORONARY ANGIOPLASTY WITH STENT PLACEMENT (3.0 x 18 mm Diet stent to OM3); Location: [facility]; Surgeon: Allie Bossier, MD   CORONARY ANGIOPLASTY WITH STENT PLACEMENT Left 07/12/1998   Procedure: CORONARY ANGIOPLASTY WITH STENT PLACEMENT (2.8 x 8 mm Duet stent to D1, 2.5 x 13 mm Duet stent to RPDA); Location: [facility]   GRAFT APPLICATION Left 10/19/5571   Procedure: GRAFT APPLICATION;  Surgeon: Benjamine Sprague, DO;  Location: [facility] ORS;  Service: General;  Laterality: Left;   LEFT HEART CATH AND CORONARY ANGIOGRAPHY Left 08/30/2019   Procedure: LEFT HEART CATH AND CORONARY ANGIOGRAPHY;  Surgeon: Wellington Hampshire, MD;  Location: [place] [facility] LAB;  Service: Cardiovascular;  Laterality: Left;   PROSTATE SURGERY     urethra grew around prostate    TUMOR REMOVAL  2001   chest thymic cyst; behind lungs [facility] benign    Family History  Problem Relation Age of Onset   Cancer - Lung Mother    Hypertension Mother    Heart attack Mother    Cancer Father        larynx   Dementia Maternal Grandmother    Heart attack Paternal Grandfather    Colon cancer Neg Hx    Stomach cancer Neg Hx    Pancreatic cancer Neg Hx    Social History   Tobacco Use   Smoking status: Former    Packs/day: 0.50    Years: 42.00    Total pack years: 21.00    Types: Cigarettes    Quit date: 11/10/2019    Years since quitting: 2.3   Smokeless tobacco: Never  Substance Use Topics   Alcohol use: Yes     Alcohol/week: 8.0 standard drinks of alcohol    Types: 8 Cans of beer per week    Comment: per week   Allergies  Allergen Reactions   Nitroglycerin Nausea Only and Other (See Comments)    Per patient "causes severe  headache"     Review of Systems  Constitutional:  Positive for fatigue. Negative for appetite change.  HENT:  Negative for congestion, postnasal drip and sore throat.   Eyes: Negative.   Respiratory:  Negative for chest tightness and shortness of breath.   Cardiovascular:  Negative for chest pain, palpitations and leg swelling.  Gastrointestinal:  Negative for abdominal distention and abdominal pain.  Endocrine: Negative.   Genitourinary: Negative.   Musculoskeletal:  Positive for arthralgias (left arm). Negative for back pain.  Skin: Negative.   Allergic/Immunologic: Negative.   Neurological:  Negative for dizziness and  light-headedness.  Hematological:  Negative for adenopathy. Does not bruise/bleed easily.  Psychiatric/Behavioral:  Negative for dysphoric mood and sleep disturbance. The patient is not nervous/anxious.       Physical Exam Vitals and nursing note reviewed. Exam conducted with a chaperone present (wife).  Constitutional:      Appearance: Normal appearance.  HENT:     Head: Normocephalic and atraumatic.  Cardiovascular:     Rate and Rhythm: Regular rhythm. Bradycardia present.  Pulmonary:     Effort: Pulmonary effort is normal. No respiratory distress.     Breath sounds: No wheezing or rales.  Abdominal:     General: There is no distension.     Palpations: Abdomen is soft.  Musculoskeletal:        General: No tenderness.     Cervical back: Normal range of motion and neck supple.     Right lower leg: No edema.     Left lower leg: No edema.  Skin:    General: Skin is warm and dry.  Neurological:     General: No focal deficit present.     Mental Status: He is alert and oriented to person, place, and time.  Psychiatric:        Mood and  Affect: Mood normal.        Behavior: Behavior normal.     Assessment & Plan:  1: Chronic heart failure with reduced ejection fraction- - NYHA class II - euvolemic today - has working scales; reviewed the importance of weighing daily so that he can call for an overnight weight gain of > 2 pounds or a weekly weight gain of >5 pounds - weight 201 pounds from last visit here 4 months ago - not adding salt and has been reading food labels for sodium content - saw cardiology Rockey Situ) 10/22/21 - on GDMT of carvedilol, spironolactone & entresto (getting entresto through [employer] through PCP office) - no BNP to report  2: HTN- - BP  - saw PCP (McLean-Scocuzza) 02/08/22 - BMP 02/27/22 reviewed and showed sodium 135, potassium 4.5, creatinine 1.38 & GFR 52.63  3: VT- - saw EP Caryl Comes) 09/18/21 - has AICD present  4: Left arm open wound- - saw ID (Ravishankar) 08/14/21 - had myriad matrix to facilitate healing after debridement of exudative islands on 08/24/21 - saw surgeon Lysle Pearl) 10/22/21   Patient did not bring his medications nor a list. Each medication was verbally reviewed with the patient and he was encouraged to bring the bottles to every visit to confirm accuracy of list.

## 2022-03-11 ENCOUNTER — Telehealth: Payer: Self-pay | Admitting: Family

## 2022-03-11 ENCOUNTER — Ambulatory Visit: Payer: Medicare Other | Admitting: Family

## 2022-03-11 NOTE — Telephone Encounter (Signed)
Patient did not show for his [facility] appointment on 03/11/22. Will attempt to reschedule.

## 2022-03-12 ENCOUNTER — Ambulatory Visit (INDEPENDENT_AMBULATORY_CARE_PROVIDER_SITE_OTHER): Payer: Medicare Other

## 2022-03-12 DIAGNOSIS — I255 Ischemic cardiomyopathy: Secondary | ICD-10-CM | POA: Diagnosis not present

## 2022-03-12 LAB — CUP PACEART REMOTE DEVICE CHECK
Battery Remaining Longevity: 30 mo
Battery Remaining Percentage: 36 %
Brady Statistic RV Percent Paced: 0 %
Date Time Interrogation Session: 20230815022100
HighPow Impedance: 90 Ohm
Implantable Lead Implant Date: 20130503
Implantable Lead Location: 753860
Implantable Lead Model: 292
Implantable Lead Serial Number: 112568
Implantable Pulse Generator Implant Date: 20130503
Lead Channel Impedance Value: 649 Ohm
Lead Channel Pacing Threshold Amplitude: 0.6 V
Lead Channel Pacing Threshold Pulse Width: 0.5 ms
Lead Channel Setting Pacing Amplitude: 2.4 V
Lead Channel Setting Pacing Pulse Width: 0.5 ms
Lead Channel Setting Sensing Sensitivity: 0.6 mV
Pulse Gen Serial Number: 105232

## 2022-03-13 ENCOUNTER — Encounter: Payer: Self-pay | Admitting: Family

## 2022-03-13 ENCOUNTER — Ambulatory Visit: Payer: Medicare Other | Attending: Family | Admitting: Family

## 2022-03-13 ENCOUNTER — Telehealth: Payer: Self-pay | Admitting: Internal Medicine

## 2022-03-13 ENCOUNTER — Telehealth: Payer: Self-pay

## 2022-03-13 ENCOUNTER — Other Ambulatory Visit: Payer: Self-pay | Admitting: Family

## 2022-03-13 VITALS — BP 118/58 | HR 47 | Resp 20 | Ht 69.0 in | Wt 201.2 lb

## 2022-03-13 DIAGNOSIS — Z9049 Acquired absence of other specified parts of digestive tract: Secondary | ICD-10-CM | POA: Diagnosis not present

## 2022-03-13 DIAGNOSIS — I255 Ischemic cardiomyopathy: Secondary | ICD-10-CM | POA: Diagnosis not present

## 2022-03-13 DIAGNOSIS — I2510 Atherosclerotic heart disease of native coronary artery without angina pectoris: Secondary | ICD-10-CM | POA: Diagnosis not present

## 2022-03-13 DIAGNOSIS — I5022 Chronic systolic (congestive) heart failure: Secondary | ICD-10-CM | POA: Diagnosis not present

## 2022-03-13 DIAGNOSIS — M25512 Pain in left shoulder: Secondary | ICD-10-CM | POA: Diagnosis not present

## 2022-03-13 DIAGNOSIS — I10 Essential (primary) hypertension: Secondary | ICD-10-CM | POA: Diagnosis not present

## 2022-03-13 DIAGNOSIS — Z95810 Presence of automatic (implantable) cardiac defibrillator: Secondary | ICD-10-CM | POA: Diagnosis not present

## 2022-03-13 DIAGNOSIS — I472 Ventricular tachycardia, unspecified: Secondary | ICD-10-CM

## 2022-03-13 DIAGNOSIS — E785 Hyperlipidemia, unspecified: Secondary | ICD-10-CM | POA: Insufficient documentation

## 2022-03-13 DIAGNOSIS — F419 Anxiety disorder, unspecified: Secondary | ICD-10-CM | POA: Insufficient documentation

## 2022-03-13 DIAGNOSIS — Z8674 Personal history of sudden cardiac arrest: Secondary | ICD-10-CM | POA: Diagnosis not present

## 2022-03-13 DIAGNOSIS — Z87891 Personal history of nicotine dependence: Secondary | ICD-10-CM | POA: Insufficient documentation

## 2022-03-13 DIAGNOSIS — Z8249 Family history of ischemic heart disease and other diseases of the circulatory system: Secondary | ICD-10-CM | POA: Insufficient documentation

## 2022-03-13 DIAGNOSIS — I110 Hypertensive heart disease with heart failure: Secondary | ICD-10-CM | POA: Insufficient documentation

## 2022-03-13 DIAGNOSIS — R200 Anesthesia of skin: Secondary | ICD-10-CM | POA: Insufficient documentation

## 2022-03-13 DIAGNOSIS — J449 Chronic obstructive pulmonary disease, unspecified: Secondary | ICD-10-CM | POA: Insufficient documentation

## 2022-03-13 DIAGNOSIS — Z7984 Long term (current) use of oral hypoglycemic drugs: Secondary | ICD-10-CM | POA: Insufficient documentation

## 2022-03-13 DIAGNOSIS — G8929 Other chronic pain: Secondary | ICD-10-CM | POA: Diagnosis not present

## 2022-03-13 MED ORDER — DAPAGLIFLOZIN PROPANEDIOL 10 MG PO TABS: 10.0000 mg | ORAL_TABLET | Freq: Every day | ORAL | 5 refills | Status: DC

## 2022-03-13 MED ORDER — DAPAGLIFLOZIN PROPANEDIOL 10 MG PO TABS: 10.0000 mg | ORAL_TABLET | Freq: Every day | ORAL | 3 refills | Status: DC

## 2022-03-13 NOTE — Telephone Encounter (Signed)
Me to Ian Arnold, FNP      03/13/22  9:52 AM Spoke with pt, he stated that his left shoulder has pain that he can feel near his elbow at times. He wanted a referral I advised pt that he would need to be seen for a provider to order a referral for him. I got him sched 8/18 with Dr. Derrel Nip @ 10 am.     AD   03/13/22  9:22 AM Burt Knack D routed this conversation to Me  Burt Knack D   AD   03/13/22  9:22 AM Note Pt called wanting to get a referral for his severe left shoulder to his elbow pain

## 2022-03-13 NOTE — Telephone Encounter (Signed)
Pt called wanting to get a referral for his severe left shoulder to his elbow pain

## 2022-03-13 NOTE — Patient Instructions (Addendum)
Continue weighing daily and call for an overnight weight gain of 3 pounds or more or a weekly weight gain of more than 5 pounds.   If you have voicemail, please make sure your mailbox is cleaned out so that we may leave a message and please make sure to listen to any voicemails.    Start farxiga as 1 tablet every morning.

## 2022-03-13 NOTE — Progress Notes (Signed)
Patient ID: Ian Mills, male    DOB: Sep 10, 1955, 66 y.o.   MRN: [id number]  HPI  Ian Mills is a 66 y/o male with a history of CAD, hyperlipidemia, HTN, anxiety, aortic atherosclerosis, COPD, VT (AICD) previous tobacco use and chronic heart failure.   Echo report from 07/12/21 reviewed and showed an EF of 25-30%  LHC done 08/30/19 showed: Prox RCA to Mid RCA lesion is 20% stenosed. Prox RCA lesion is 30% stenosed. Prox LAD to Mid LAD lesion is 100% stenosed. 1st Diag-1 lesion is 60% stenosed. Previously placed 1st Diag-2 stent (unknown type) is widely patent. Ost Cx to Prox Cx lesion is 60% stenosed. Mid Cx lesion is 40% stenosed.   1. Significant underlying three-vessel coronary artery disease with chronically occluded mid LAD, patent first diagonal stent, patent RCA stents with mild in-stent restenosis, patent left circumflex stent with moderate in-stent restenosis.  Progression of ostial left circumflex stenosis to 60%. 2.  Left ventricular angiography was not performed due to chronic kidney disease. 3.  Moderately elevated left ventricular end-diastolic pressure between 25 to 30 mmHg.  Has not been admitted or been in the ED in the last 6 months.  He presents today for a follow-up visit with a chief complaint of left shoulder/arm pain. Describes this as chronic in nature. Says that it's interfering with his sleep and occasionally causes numbness in his arm. No injury that he can recall. He has associated wheezing along with this. He denies any dizziness, abdominal distention, palpitations, pedal edema, chest pain, shortness of breath, cough, fatigue or weight gain.   Weighing daily and taking his torsemide on M, W, F. Has not needed to take any additional diuretic.   Getting his entresto through [employer] patient assistance through his PCP office.   Past Medical History:  Diagnosis Date   Acute anterior wall MI ([place]) 07/04/1991   a.) LHC: EF 29%; 100% mLAD -> PTCA performed    Anxiety    Aortic atherosclerosis (HCC)    CHF (congestive heart failure) (HCC)    COPD (chronic obstructive pulmonary disease) (HCC)    Coronary artery disease    a.) 3 stents --> 3x18 mm Duet to OM3, 2.5x18 mm Duet to D1, 2.5x13 mm Duet to RPDA. b. 06/2013 PCI: CTO LAD, patent LAD stent, RCA 70p (FFR 0.74--> DES), 5m(DES), RPDA 50%, EF 20%; c. 08/2019 Cath: LM nl, LAD 100p/m, D1 60, D2 patent stent, LCX 60ost/p, 457mSR, RCA 30p, 2032mR.   Family history of lung cancer    HFrEF (heart failure with reduced ejection fraction) ([place]  a. 05/2014 Echo: EF 25-30%; b. 01/2018 Echo ([facility]): EF 30%; c. 06/2021 Echo: EF 25-30%, apical AK, GrI DD, nl RV fxn.   HLD (hyperlipidemia)    HTN (hypertension)    ICD  single,BSX    a. DOI 11/2011; b. S/N# [id number] cardiomyopathy    a. 05/2014 Echo: EF 25-30%; b. 01/2018 Echo ([facility]): EF 30%, c. 06/2021 Echo: EF 25-30%.   Lateral wall myocardial infarction ([place]CC[place]2/08/1996   a.) LHC: EF 44%; 75% RPDA and 95% OM3; PCI performed and a 3.0 x 18 mm Duet stent placed to OM3[place] current use of antithrombotics/antiplatelets    a.) DAPT therapy (ASA + clopidogrel)   NSTEMI (non-ST elevated myocardial infarction) ([place]5/07/2011   a.) LHC: 100% mLAD; intervention deferred opting for medical management.   Personal history of colonic polyps    Pneumonia 08/2018  Sepsis ([place]) 07/29/2021   Syncope and collapse    Ventricular tachycardia ([place]) 12/01/2011   a.) VT arrest; ROSC achieved --[place] placed.   Past Surgical History:  Procedure Laterality Date   APPENDECTOMY N/A    CARDIAC DEFIBRILLATOR PLACEMENT N/A 12/01/2011   COLONOSCOPY WITH PROPOFOL N/A 12/22/2020   Procedure: COLONOSCOPY WITH PROPOFOL;  Surgeon: Jonathon Bellows, MD;  [facility];  Service: Gastroenterology;  Laterality: N/A;   CORONARY ANGIOPLASTY Left 07/05/1991   Procedure: CORONARY ANGIOPLASTY; Location: [facility]; Surgeon: Doristine Bosworth, MD   CORONARY  ANGIOPLASTY Left 08/06/1991   Procedure: CORONARY ANGIOPLASTY; Location: [facility]; Surgeon: Ellouise Newer, MD   CORONARY ANGIOPLASTY Left 08/09/1991   Procedure: CORONARY ANGIOPLASTY; Location: [facility]; Surgeon: Ellouise Newer, MD   CORONARY ANGIOPLASTY Left 10/28/1991   Procedure: CORONARY ANGIOPLASTY; Location: [facility]; Surgeon: Ellouise Newer, MD   CORONARY ANGIOPLASTY Left 11/02/1991   Procedure: CORONARY ANGIOPLASTY; Location: [facility]; Surgeon: Ellouise Newer, MD   CORONARY ANGIOPLASTY Left 01/10/1992   Procedure: CORONARY ANGIOPLASTY; Location: [facility]; Surgeon: Carles Collet, MD   CORONARY ANGIOPLASTY Left 09/26/1993   Procedure: CORONARY ANGIOPLASTY; Location: [facility]; Surgeon: Melton Alar, MD   CORONARY ANGIOPLASTY Left 09/11/2006   Procedure: CORONARY ANGIOPLASTY; Location: [facility]   CORONARY ANGIOPLASTY Left 01/17/2010   Procedure: CORONARY ANGIOPLASTY; Location: [facility]; Surgeon: Wendee Beavers, MD   CORONARY ANGIOPLASTY Left 11/27/2011   Procedure: CORONARY ANGIOPLASTY; Location: [facility]; Surgeon: Derinda Sis, MD   CORONARY ANGIOPLASTY WITH STENT PLACEMENT Left 06/29/1997   Procedure: CORONARY ANGIOPLASTY WITH STENT PLACEMENT (3.0 x 18 mm Diet stent to OM3); Location: [facility]; Surgeon: Allie Bossier, MD   CORONARY ANGIOPLASTY WITH STENT PLACEMENT Left 07/12/1998   Procedure: CORONARY ANGIOPLASTY WITH STENT PLACEMENT (2.8 x 8 mm Duet stent to D1, 2.5 x 13 mm Duet stent to RPDA); Location: [facility]   GRAFT APPLICATION Left 0/03/2329   Procedure: GRAFT APPLICATION;  Surgeon: Benjamine Sprague, DO;  Location: [facility] ORS;  Service: General;  Laterality: Left;   LEFT HEART CATH AND CORONARY ANGIOGRAPHY Left 08/30/2019   Procedure: LEFT HEART CATH AND CORONARY ANGIOGRAPHY;  Surgeon: Wellington Hampshire, MD;  Location: [place] CV LAB;  Service: Cardiovascular;  Laterality: Left;   PROSTATE SURGERY     urethra grew around prostate    TUMOR REMOVAL  2001   chest thymic cyst; behind lungs [facility] benign    Family History  Problem  Relation Age of Onset   Cancer - Lung Mother    Hypertension Mother    Heart attack Mother    Cancer Father        larynx   Dementia Maternal Grandmother    Heart attack Paternal Grandfather    Colon cancer Neg Hx    Stomach cancer Neg Hx    Pancreatic cancer Neg Hx    Social History   Tobacco Use   Smoking status: Former    Packs/day: 0.50    Years: 42.00    Total pack years: 21.00    Types: Cigarettes    Quit date: 11/10/2019    Years since quitting: 2.3   Smokeless tobacco: Never  Substance Use Topics   Alcohol use: Yes    Alcohol/week: 8.0 standard drinks of alcohol    Types: 8 Cans of beer per week    Comment: per week   Allergies  Allergen Reactions   Nitroglycerin Nausea Only and Other (See Comments)    Per patient "causes severe  headache"   Prior to Admission medications   Medication Sig Start Date End  Date Taking? Authorizing Provider  albuterol (VENTOLIN HFA) 108 (90 Base) MCG/ACT inhaler Inhale 1-2 puffs into the lungs every 6 (six) hours as needed for wheezing or shortness of breath. 02/08/22  Yes McLean-Scocuzza, Nino Glow, MD  ALPRAZolam Duanne Moron) 0.5 MG tablet Take 1 tablet (0.5 mg total) by mouth 3 (three) times daily as needed. for anxiety 02/08/22  Yes McLean-Scocuzza, Nino Glow, MD  amiodarone (PACERONE) 200 MG tablet Take 1 tablet (200 mg total) by mouth daily. 02/08/22  Yes McLean-Scocuzza, Nino Glow, MD  amoxicillin-clavulanate (AUGMENTIN) 875-125 MG tablet Take 1 tablet by mouth 2 (two) times daily. With food 02/08/22  Yes McLean-Scocuzza, Nino Glow, MD  aspirin EC 81 MG tablet Take 81 mg by mouth daily. Swallow whole.   Yes [provider]  atorvastatin (LIPITOR) 80 MG tablet Take 1 tablet (80 mg total) by mouth daily. 02/08/22  Yes McLean-Scocuzza, Nino Glow, MD  carvedilol (COREG) 3.125 MG tablet Take 1 tablet (3.125 mg total) by mouth 2 (two) times daily. 02/08/22  Yes McLean-Scocuzza, Nino Glow, MD  clopidogrel (PLAVIX) 75 MG tablet Take 1 tablet (75 mg  total) by mouth daily. 02/08/22  Yes McLean-Scocuzza, Nino Glow, MD  ezetimibe (ZETIA) 10 MG tablet Take 1 tablet (10 mg total) by mouth daily. 02/08/22  Yes McLean-Scocuzza, Nino Glow, MD  potassium chloride (KLOR-CON 10) 10 MEQ tablet Take 1 tablet (10 mEq total) by mouth daily. 02/08/22  Yes McLean-Scocuzza, Nino Glow, MD  sacubitril-valsartan (ENTRESTO) 97-103 MG Take 1 tablet by mouth 2 (two) times daily. 12/14/21  Yes Minna Merritts, MD  spironolactone (ALDACTONE) 25 MG tablet Take 1 tablet (25 mg total) by mouth daily. 02/08/22  Yes McLean-Scocuzza, Nino Glow, MD  tadalafil (CIALIS) 10 MG tablet Take 1-2 tablets (10-20 mg total) by mouth daily as needed for erectile dysfunction. 08/23/21  Yes Billey Co, MD  Tiotropium Bromide-Olodaterol (STIOLTO RESPIMAT) 2.5-2.5 MCG/ACT AERS Inhale 2 puffs into the lungs daily. 02/08/22  Yes McLean-Scocuzza, Nino Glow, MD  torsemide (DEMADEX) 20 MG tablet Take 1 tablet (20 mg) by mouth on M, W, F.  You may take 1 extra tablet (20 mg) by mouth once daily as needed for swelling/ weight gain/ shortness of breath 02/08/22  Yes McLean-Scocuzza, Nino Glow, MD  ipratropium-albuterol (DUONEB) 0.5-2.5 (3) MG/3ML SOLN Take 3 mLs by nebulization every 6 (six) hours as needed. Patient not taking: Reported on 03/13/2022 02/08/22   McLean-Scocuzza, Nino Glow, MD   Review of Systems  Constitutional:  Negative for appetite change and fatigue.  HENT:  Negative for congestion, postnasal drip and sore throat.   Eyes: Negative.   Respiratory:  Positive for wheezing. Negative for cough, chest tightness and shortness of breath.   Cardiovascular:  Negative for chest pain, palpitations and leg swelling.  Gastrointestinal:  Negative for abdominal distention and abdominal pain.  Endocrine: Negative.   Genitourinary: Negative.   Musculoskeletal:  Positive for arthralgias (left shoulder/ arm). Negative for back pain.  Skin: Negative.   Allergic/Immunologic: Negative.   Neurological:  Negative  for dizziness and light-headedness.  Hematological:  Negative for adenopathy. Does not bruise/bleed easily.  Psychiatric/Behavioral:  Positive for sleep disturbance (trouble sleeping due to arm pain). Negative for dysphoric mood. The patient is not nervous/anxious.    Vitals:   03/13/22 0824  BP: (!) 118/58  Pulse: (!) 47  Resp: 20  SpO2: 100%  Weight: 201 lb 4 oz (91.3 kg)  Height: '5\' 9"'$  (1.753 m)   Wt Readings from Last 3 Encounters:  03/13/22 201 lb 4 oz (91.3 kg)  11/02/21 201 lb (91.2 kg)  10/22/21 207 lb 2 oz (94 kg)   Lab Results  Component Value Date   CREATININE 1.38 02/27/2022   CREATININE 1.78 (H) 11/12/2021   CREATININE 2.08 (H) 11/02/2021   Physical Exam Vitals and nursing note reviewed.  Constitutional:      Appearance: Normal appearance.  HENT:     Head: Normocephalic and atraumatic.  Cardiovascular:     Rate and Rhythm: Regular rhythm. Bradycardia present.  Pulmonary:     Effort: Pulmonary effort is normal. No respiratory distress.     Breath sounds: No wheezing or rales.  Abdominal:     General: There is no distension.     Palpations: Abdomen is soft.  Musculoskeletal:        General: No tenderness.     Cervical back: Normal range of motion and neck supple.     Right lower leg: No edema.     Left lower leg: No edema.  Skin:    General: Skin is warm and dry.  Neurological:     General: No focal deficit present.     Mental Status: He is alert and oriented to person, place, and time.  Psychiatric:        Mood and Affect: Mood normal.        Behavior: Behavior normal.    Assessment & Plan:  1: Chronic heart failure with reduced ejection fraction- - NYHA class I - euvolemic today - weighing daily; reminded to call for an overnight weight gain of > 2 pounds or a weekly weight gain of >5 pounds - weight unchanged from last visit here 4 months ago - not adding salt and has been reading food labels for sodium content - saw cardiology Rockey Situ)  10/22/21; returns 04/24/22 - on GDMT of carvedilol, spironolactone & entresto (getting entresto through [employer] through PCP office) - will add farxiga '10mg'$  daily - 30 day voucher and 3 weeks samples provided; patient assistance forms filled out today - will ask cardiology to do BMET at their visit end of September - may be able to use torsemide PRN after he's been on farxiga - no BNP to report  2: HTN- - BP looks good (118/58) - had video visit with PCP (McLean-Scocuzza) 02/08/22 - BMP 02/27/22 reviewed and showed sodium 135, potassium 4.5, creatinine 1.38 & GFR 52.63  3: VT- - saw EP Caryl Comes) 09/18/21 - has AICD present - device check being done 03/26/22  4: Left shoulder pain- - says that this has been present for "awhile" but he didn't think to mention it to his PCP during last video call - interfering some with his sleep and reports occasional numbness - can't raise his arm completely up without increase in pain - emphasized that he call his PCP regarding this    Patient did not bring his medications nor a list. Each medication was verbally reviewed with the patient and he was encouraged to bring the bottles to every visit to confirm accuracy of list.   Return in 2 months, sooner if needed.

## 2022-03-15 ENCOUNTER — Encounter: Payer: Self-pay | Admitting: Internal Medicine

## 2022-03-15 ENCOUNTER — Ambulatory Visit (INDEPENDENT_AMBULATORY_CARE_PROVIDER_SITE_OTHER): Payer: Medicare Other | Admitting: Internal Medicine

## 2022-03-15 DIAGNOSIS — J069 Acute upper respiratory infection, unspecified: Secondary | ICD-10-CM | POA: Diagnosis not present

## 2022-03-15 DIAGNOSIS — I700 Atherosclerosis of aorta: Secondary | ICD-10-CM | POA: Diagnosis not present

## 2022-03-15 DIAGNOSIS — J3089 Other allergic rhinitis: Secondary | ICD-10-CM

## 2022-03-15 DIAGNOSIS — J309 Allergic rhinitis, unspecified: Secondary | ICD-10-CM | POA: Insufficient documentation

## 2022-03-15 DIAGNOSIS — M25512 Pain in left shoulder: Secondary | ICD-10-CM | POA: Insufficient documentation

## 2022-03-15 DIAGNOSIS — J42 Unspecified chronic bronchitis: Secondary | ICD-10-CM | POA: Diagnosis not present

## 2022-03-15 MED ORDER — PREDNISONE 10 MG PO TABS: ORAL_TABLET | ORAL | 0 refills | Status: DC

## 2022-03-15 NOTE — Progress Notes (Unsigned)
Telephone  Note    This format is felt to be most appropriate for this patient at this time.  All issues noted in this document were discussed and addressed.  No physical exam was performed (except for noted visual exam findings with Video Visits).   I connected with  Mr Ian Mills  on 03/15/22 at 10:00 AM EDT by  telephone and verified that I am speaking with the correct person using two identifiers. Location patient: home Location provider: work or home office Persons participating in the virtual visit: patient, provider  I discussed the limitations, risks, security and privacy concerns of performing an evaluation and management service by telephone and the availability of in person appointments. I also discussed with the patient that there may be a patient responsible charge related to this service. The patient expressed understanding and agreed to proceed.  Reason for visit: left shoulder pain x 4 weeks,   sinus congestion, rhinitis, conjunctivitis x 2 weeks   HPI:  66 yr old ex smoker presents with rhinitis, hoarseness for the past 2 weeks .  Had exposure to smoke a few days prior to onset of symptoms.  Sneezing  a lot , but denies sore throat., fevers, maxillary or frontal  sinus tenderness , and shortness of breath.  Was scheduled for an in office visit for evaluation of shoulder pain, but states that he " Felt too  bad to be around a lot of people." No headache this time compared to last month when he was treated for acute sinusitis.  Currently taking zyrtec   Left shoulder pain:  History of fractured collarbone (left) remotely  at age 73.  Shoulder left intermittently  bothers him,  but has been constantly painful for the last 4 weeks   and is  aggravated by flexion of shoulder to horizontal plane . Pain starting to radiate to elbow but not below.  Denies numbness and tingling , denies clumsiness of hand \   CAD/CHF :  h/o defibrillator placement. Denies chest pain .  Seen recently and  [place] added as a trial with  samples given  but has not started it. Takes torsemide,  weights have been stable.    ROS: See pertinent positives and negatives per HPI.  Past Medical History:  Diagnosis Date   Acute anterior wall MI ([place]) 07/04/1991   a.) LHC: EF 29%; 100% mLAD -> PTCA performed   Anxiety    Aortic atherosclerosis (HCC)    CHF (congestive heart failure) (HCC)    COPD (chronic obstructive pulmonary disease) (HCC)    Coronary artery disease    a.) 3 stents --> 3x18 mm Duet to OM3, 2.5x18 mm Duet to D1, 2.5x13 mm Duet to RPDA. b. 06/2013 PCI: CTO LAD, patent LAD stent, RCA 70p (FFR 0.74--> DES), 48m(DES), RPDA 50%, EF 20%; c. 08/2019 Cath: LM nl, LAD 100p/m, D1 60, D2 patent stent, LCX 60ost/p, 485mSR, RCA 30p, 2015mR.   Family history of lung cancer    HFrEF (heart failure with reduced ejection fraction) ([facility]  a. 05/2014 Echo: EF 25-30%; b. 01/2018 Echo ([facility]): EF 30%; c. 06/2021 Echo: EF 25-30%, apical AK, GrI DD, nl RV fxn.   HLD (hyperlipidemia)    HTN (hypertension)    ICD  single,BSX    a. DOI 11/2011; b. S/N# [id number] cardiomyopathy    a. 05/2014 Echo: EF 25-30%; b. 01/2018 Echo ([facility]): EF 30%, c. 06/2021 Echo: EF 25-30%.   Lateral wall myocardial  infarction ([place]) 06/29/1997   a.) LHC: EF 44%; 75% RPDA and 95% OM3; PCI performed and a 3.0 x 18 mm Duet stent placed to [place] term current use of antithrombotics/antiplatelets    a.) DAPT therapy (ASA + clopidogrel)   NSTEMI (non-ST elevated myocardial infarction) ([place]) 11/27/2011   a.) LHC: 100% mLAD; intervention deferred opting for medical management.   Personal history of colonic polyps    Pneumonia 08/2018   Sepsis ([place]) 07/29/2021   Sepsis due to group A Streptococcus with acute renal failure and septic shock (HCC)    Syncope and collapse    Ventricular tachycardia ([place]) 12/01/2011   a.) VT arrest; ROSC achieved --[place] placed.    Past Surgical History:  Procedure  Laterality Date   APPENDECTOMY N/A    CARDIAC DEFIBRILLATOR PLACEMENT N/A 12/01/2011   COLONOSCOPY WITH PROPOFOL N/A 12/22/2020   Procedure: COLONOSCOPY WITH PROPOFOL;  Surgeon: Jonathon Bellows, MD;  Location: [facility] ENDOSCOPY;  Service: Gastroenterology;  Laterality: N/A;   CORONARY ANGIOPLASTY Left 07/05/1991   Procedure: CORONARY ANGIOPLASTY; Location: [facility]; Surgeon: Doristine Bosworth, MD   CORONARY ANGIOPLASTY Left 08/06/1991   Procedure: CORONARY ANGIOPLASTY; Location: [facility]; Surgeon: Ellouise Newer, MD   CORONARY ANGIOPLASTY Left 08/09/1991   Procedure: CORONARY ANGIOPLASTY; Location: [facility]; Surgeon: Ellouise Newer, MD   CORONARY ANGIOPLASTY Left 10/28/1991   Procedure: CORONARY ANGIOPLASTY; Location: [facility]; Surgeon: Ellouise Newer, MD   CORONARY ANGIOPLASTY Left 11/02/1991   Procedure: CORONARY ANGIOPLASTY; Location: [facility]; Surgeon: Ellouise Newer, MD   CORONARY ANGIOPLASTY Left 01/10/1992   Procedure: CORONARY ANGIOPLASTY; Location: [facility]; Surgeon: Carles Collet, MD   CORONARY ANGIOPLASTY Left 09/26/1993   Procedure: CORONARY ANGIOPLASTY; Location: [facility]; Surgeon: Melton Alar, MD   CORONARY ANGIOPLASTY Left 09/11/2006   Procedure: CORONARY ANGIOPLASTY; Location: [facility]   CORONARY ANGIOPLASTY Left 01/17/2010   Procedure: CORONARY ANGIOPLASTY; Location: [facility]; Surgeon: Wendee Beavers, MD   CORONARY ANGIOPLASTY Left 11/27/2011   Procedure: CORONARY ANGIOPLASTY; Location: [facility]; Surgeon: Derinda Sis, MD   CORONARY ANGIOPLASTY WITH STENT PLACEMENT Left 06/29/1997   Procedure: CORONARY ANGIOPLASTY WITH STENT PLACEMENT (3.0 x 18 mm Diet stent to OM3); Location: [facility]; Surgeon: Allie Bossier, MD   CORONARY ANGIOPLASTY WITH STENT PLACEMENT Left 07/12/1998   Procedure: CORONARY ANGIOPLASTY WITH STENT PLACEMENT (2.8 x 8 mm Duet stent to D1, 2.5 x 13 mm Duet stent to RPDA); Location: [facility]   GRAFT APPLICATION Left 1/61/0960   Procedure: GRAFT APPLICATION;  Surgeon: Benjamine Sprague, DO;  Location: [facility] ORS;  Service:  General;  Laterality: Left;   LEFT HEART CATH AND CORONARY ANGIOGRAPHY Left 08/30/2019   Procedure: LEFT HEART CATH AND CORONARY ANGIOGRAPHY;  Surgeon: Wellington Hampshire, MD;  Location: [facility];  Service: Cardiovascular;  Laterality: Left;   PROSTATE SURGERY     urethra grew around prostate    TUMOR REMOVAL  2001   chest thymic cyst; behind lungs [facility] benign     Family History  Problem Relation Age of Onset   Cancer - Lung Mother    Hypertension Mother    Heart attack Mother    Cancer Father        larynx   Dementia Maternal Grandmother    Heart attack Paternal Grandfather    Colon cancer Neg Hx    Stomach cancer Neg Hx    Pancreatic cancer Neg Hx     SOCIAL HX:  reports that he quit smoking about 2 years ago. His smoking use included cigarettes. He has a 21.00 pack-year smoking  history. He has never used smokeless tobacco. He reports current alcohol use of about 8.0 standard drinks of alcohol per week. He reports that he does not use drugs.    Current Outpatient Medications:    albuterol (VENTOLIN HFA) 108 (90 Base) MCG/ACT inhaler, Inhale 1-2 puffs into the lungs every 6 (six) hours as needed for wheezing or shortness of breath., Disp: 18 g, Rfl: 11   ALPRAZolam (XANAX) 0.5 MG tablet, Take 1 tablet (0.5 mg total) by mouth 3 (three) times daily as needed. for anxiety, Disp: 90 tablet, Rfl: 2   amiodarone (PACERONE) 200 MG tablet, Take 1 tablet (200 mg total) by mouth daily., Disp: 90 tablet, Rfl: 0   aspirin EC 81 MG tablet, Take 81 mg by mouth daily. Swallow whole., Disp: , Rfl:    atorvastatin (LIPITOR) 80 MG tablet, Take 1 tablet (80 mg total) by mouth daily., Disp: 90 tablet, Rfl: 3   carvedilol (COREG) 3.125 MG tablet, Take 1 tablet (3.125 mg total) by mouth 2 (two) times daily., Disp: 180 tablet, Rfl: 3   clopidogrel (PLAVIX) 75 MG tablet, Take 1 tablet (75 mg total) by mouth daily., Disp: 90 tablet, Rfl: 3   dapagliflozin propanediol (FARXIGA) 10 MG TABS tablet,  Take 1 tablet (10 mg total) by mouth daily before breakfast., Disp: 90 tablet, Rfl: 3   ezetimibe (ZETIA) 10 MG tablet, Take 1 tablet (10 mg total) by mouth daily., Disp: 90 tablet, Rfl: 3   ipratropium-albuterol (DUONEB) 0.5-2.5 (3) MG/3ML SOLN, Take 3 mLs by nebulization every 6 (six) hours as needed., Disp: 360 mL, Rfl: 11   potassium chloride (KLOR-CON 10) 10 MEQ tablet, Take 1 tablet (10 mEq total) by mouth daily., Disp: 90 tablet, Rfl: 3   predniSONE (DELTASONE) 10 MG tablet, 6 tablets on Day 1 , then reduce by 1 tablet daily until gone, Disp: 21 tablet, Rfl: 0   sacubitril-valsartan (ENTRESTO) 97-103 MG, Take 1 tablet by mouth 2 (two) times daily., Disp: 180 tablet, Rfl: 0   spironolactone (ALDACTONE) 25 MG tablet, Take 1 tablet (25 mg total) by mouth daily., Disp: 90 tablet, Rfl: 3   tadalafil (CIALIS) 10 MG tablet, Take 1-2 tablets (10-20 mg total) by mouth daily as needed for erectile dysfunction., Disp: 30 tablet, Rfl: 11   Tiotropium Bromide-Olodaterol (STIOLTO RESPIMAT) 2.5-2.5 MCG/ACT AERS, Inhale 2 puffs into the lungs daily., Disp: 4 g, Rfl: 11   torsemide (DEMADEX) 20 MG tablet, Take 1 tablet (20 mg) by mouth on M, W, F.  You may take 1 extra tablet (20 mg) by mouth once daily as needed for swelling/ weight gain/ shortness of breath, Disp: 135 tablet, Rfl: 5  EXAM:   General impression: alert, cooperative and articulate.  No signs of being in distress  Lungs: speech is fluent sentence length suggests that patient is not short of breath and not punctuated by cough, sneezing or sniffing. Marland Kitchen   Psych: affect normal.  speech is articulate and non pressured .  Denies suicidal thoughts     ASSESSMENT AND PLAN:  Discussed the following assessment and plan:  Allergic rhinitis due to other allergic trigger, unspecified seasonality  Nontraumatic pain of left shoulder  Aortic atherosclerosis (HCC)  Viral upper respiratory tract infection  Chronic bronchitis, unspecified chronic  bronchitis type (HCC)  Allergic rhinitis Prednisone taper prescribed.  Advised to use zyrtec bid   Nontraumatic pain of left shoulder Persistent for 4 weeks  He has a remote history of clavicular fracture and no recent fall or  overuse incident.  Will treat with prednisone taper and refer to  [facility] if no improvement after prednisone taper   Aortic atherosclerosis ([place]) Reviewed prior imaging films,  Confirmed that patient is tolerating zetia/atorvastatin for risk reduction of stroke   URI (upper respiratory infection) Supportive care outlined.  Etiology Seems to be allergy mediated   Prednisone taper given.   Chronic obstructive pulmonary disease (HCC) Secondary to history of tobacco abyse  Managed by Pulmonology with Maintain well on Stiolto Respimat.  He currently denies wheezing and dyspnea.  Lung RADS 2 with mild centrilobular emphysema on lung cancer screening CT in July/2021    I discussed the assessment and treatment plan with the patient. The patient was provided an opportunity to ask questions and all were answered. The patient agreed with the plan and demonstrated an understanding of the instructions.   The patient was advised to call back or seek an in-person evaluation if the symptoms worsen or if the condition fails to improve as anticipated.   I spent 23 minutes dedicated to the care of this patient on the date of this encounter to include pre-visit review of his medical history,  a non Face-to-face time with the patient , and post visit ordering of testing and therapeutics.    Crecencio Mc, MD

## 2022-03-15 NOTE — Assessment & Plan Note (Signed)
Persistent for 4 weeks  History of clavicular fracture, remote.  Advised to go to [facility] if no improvement after prednisone taper

## 2022-03-15 NOTE — Assessment & Plan Note (Signed)
Prednisone taper prescribed.  Advised to use zyrtec bid

## 2022-03-17 ENCOUNTER — Encounter: Payer: Self-pay | Admitting: Internal Medicine

## 2022-03-17 DIAGNOSIS — J069 Acute upper respiratory infection, unspecified: Secondary | ICD-10-CM

## 2022-03-17 HISTORY — DX: Acute upper respiratory infection, unspecified: J06.9

## 2022-03-17 NOTE — Assessment & Plan Note (Signed)
Reviewed prior imaging films,  Confirmed that patient is tolerating zetia/atorvastatin for risk reduction of stroke

## 2022-03-17 NOTE — Assessment & Plan Note (Addendum)
Secondary to history of tobacco abyse  Managed by Pulmonology with Maintain well on Stiolto Respimat.  He currently denies wheezing and dyspnea.  Lung RADS 2 with mild centrilobular emphysema on lung cancer screening CT in July/2021

## 2022-03-17 NOTE — Assessment & Plan Note (Signed)
Supportive care outlined.  Etiology Seems to be allergy mediated   Prednisone taper given.

## 2022-03-18 DIAGNOSIS — M7542 Impingement syndrome of left shoulder: Secondary | ICD-10-CM | POA: Diagnosis not present

## 2022-03-21 ENCOUNTER — Telehealth: Payer: Self-pay | Admitting: Internal Medicine

## 2022-03-21 DIAGNOSIS — M25512 Pain in left shoulder: Secondary | ICD-10-CM

## 2022-03-21 NOTE — Telephone Encounter (Signed)
FYI

## 2022-03-21 NOTE — Telephone Encounter (Signed)
Pt called to thank tullo for recommending him to the [facility] because he found out he had rota cuff tear.

## 2022-03-26 ENCOUNTER — Ambulatory Visit: Payer: Medicare Other | Admitting: Internal Medicine

## 2022-03-27 ENCOUNTER — Ambulatory Visit (INDEPENDENT_AMBULATORY_CARE_PROVIDER_SITE_OTHER): Payer: Medicare Other | Admitting: Internal Medicine

## 2022-03-27 ENCOUNTER — Encounter: Payer: Self-pay | Admitting: Internal Medicine

## 2022-03-27 VITALS — BP 100/60 | HR 52 | Temp 98.2°F | Ht 69.0 in | Wt 198.2 lb

## 2022-03-27 DIAGNOSIS — I959 Hypotension, unspecified: Secondary | ICD-10-CM

## 2022-03-27 DIAGNOSIS — M67912 Unspecified disorder of synovium and tendon, left shoulder: Secondary | ICD-10-CM | POA: Diagnosis not present

## 2022-03-27 DIAGNOSIS — S46012A Strain of muscle(s) and tendon(s) of the rotator cuff of left shoulder, initial encounter: Secondary | ICD-10-CM | POA: Diagnosis not present

## 2022-03-27 MED ORDER — TRAMADOL HCL 50 MG PO TABS: 50.0000 mg | ORAL_TABLET | Freq: Three times a day (TID) | ORAL | 0 refills | Status: DC | PRN

## 2022-03-27 NOTE — Patient Instructions (Addendum)
Consider [facility] in [place] if you need physical therapy   Dr. Thornton Park  Phone Fax E-mail Address  [phone] Not available [place] [address]     Specialties     Orthopedic Surgery        Aspercream with lidocaine  Or  Lidocaine pain patch  Tramadol 1 to up to 3 x per day as needed  d F/u Dr. Forde Dandy  Rotator Cuff Tendinitis  Rotator cuff tendinitis is inflammation of the tendons in the rotator cuff. Tendons are tough, cord-like bands that connect muscle to bone. The rotator cuff includes all of the muscles and tendons that connect the arm to the shoulder. The rotator cuff holds the head of the humerus, or the upper arm bone, in the cup of the shoulder blade (scapula). This condition can lead to a long-term or chronic tear. The tear may be partial or complete. What are the causes? This condition is usually caused by overusing the rotator cuff. What increases the risk? This condition is more likely to develop in athletes and workers who frequently use their shoulder or reach over their heads. This can include activities such as: Tennis. Baseball or softball. Swimming. Construction work. Painting. What are the signs or symptoms? Symptoms of this condition include: Pain that spreads (radiates) from the shoulder to the upper arm. Swelling and tenderness in front of the shoulder. Pain when reaching, pulling, or lifting the arm above the head. Pain when lowering the arm from above the head. Minor pain in the shoulder when resting. Increased pain in the shoulder at night. Difficulty placing the arm behind the back. How is this diagnosed? This condition is diagnosed with a physical exam and medical history. Tests may also be done, including: X-rays. MRI. Ultrasound. CT with or without contrast. How is this treated? Treatment for this condition depends on the severity of the condition. In less severe cases,  treatment may include: Rest. This may be done with a sling that holds the shoulder still (immobilization). Your health care provider may also recommend avoiding activities that involve lifting your arm over your head. Icing the shoulder. Anti-inflammatory medicines, such as aspirin or ibuprofen. In more severe cases, treatment may include: Physical therapy. Steroid injections. Surgery. Follow these instructions at home: If you have a sling: Wear the sling as told by your health care provider. Remove it only as told by your health care provider. Loosen it if your fingers tingle, become numb, or turn cold and blue. Keep it clean. If the sling is not waterproof: Do not let it get wet. Cover it with a watertight covering when you take a bath or shower. Managing pain, stiffness, and swelling  If directed, put ice on the injured area. To do this: If you have a removable sling, remove it as told by your health care provider. Put ice in a plastic bag. Place a towel between your skin and the bag. Leave the ice on for 20 minutes, 2-3 times a day. Move your fingers often to reduce stiffness and swelling. Raise (elevate) the injured area above the level of your heart while you are lying down. Find a comfortable sleeping position, or sleep in a recliner, if available. Activity Rest your shoulder as told by your health care provider. Ask your health care provider when it is safe to drive if you have a sling on your arm. Return to your normal activities as told by your health care  provider. Ask your health care provider what activities are safe for you. Do any exercises or stretches as told by your health care provider or physical therapist. If you do repetitive overhead tasks, take small breaks in between and include stretching exercises as told by your health care provider. General instructions Do not use any products that contain nicotine or tobacco, such as cigarettes, e-cigarettes, and chewing  tobacco. These can delay healing. If you need help quitting, ask your health care provider. Take over-the-counter and prescription medicines only as told by your health care provider. Keep all follow-up visits as told by your health care provider. This is important. Contact a health care provider if: Your pain gets worse. You have new pain in your arm, hands, or fingers. Your pain is not relieved with medicine or does not get better after 6 weeks of treatment. You have crackling sensations when moving your shoulder in certain directions. You hear a snapping sound after using your shoulder, followed by severe pain and weakness. Get help right away if: Your arm, hand, or fingers are numb or tingling. Your arm, hand, or fingers are swollen or painful or they turn white or blue. Summary Rotator cuff tendinitis is inflammation of the tendons in the rotator cuff. Tendons are tough, cord-like bands that connect muscle to bone. This condition is usually caused by overusing the rotator cuff, which includes all of the muscles and tendons that connect the arm to the shoulder. This condition is more likely to develop in athletes and workers who frequently use their shoulder or reach over their heads. Treatment generally includes rest, anti-inflammatory medicines, and icing. In some cases, physical therapy and steroid injections may be needed. In severe cases, surgery may be needed. This information is not intended to replace advice given to you by your health care provider. Make sure you discuss any questions you have with your health care provider. Document Revised: 04/19/2019 Document Reviewed: 04/19/2019 Elsevier Patient Education  [place].  Shoulder Impingement Syndrome  Shoulder impingement syndrome is a condition that causes pain when connective tissues (tendons) surrounding the shoulder joint become pinched. These tendons are part of the group including muscles and tissues that help to  stabilize the shoulder (rotator cuff). Beneath the rotator cuff is a fluid-filled sac (bursa) that allows the muscles and tendons to glide smoothly. The bursa may become swollen or irritated (bursitis). Bursitis, swelling in the rotator cuff tendons, or both conditions can decrease how much space is under a bone in the shoulder joint (acromion), resulting in impingement. What are the causes? Shoulder impingement syndrome may be caused by bursitis or swelling of the rotator cuff tendons, which may result from: Repetitive overhead arm movements. Falling onto the shoulder. Weakness in the shoulder muscles. What increases the risk? You may be more likely to develop this condition if you: Play sports that involve throwing, such as baseball. Participate in sports such as tennis, volleyball, and swimming. Work as a Curator, Games developer, or Architect. Some people are also more likely to develop impingement syndrome because of the shape of their acromion bone. What are the signs or symptoms? The main symptom of this condition is pain on the front or side of the shoulder. The pain may: Get worse when lifting or raising the arm. Get worse at night. Wake you up from sleeping. Feel sharp when the shoulder is moved and then fade to an ache. Other symptoms may include: Tenderness. Stiffness. Inability to raise the arm above shoulder level or  behind the body. Weakness. How is this diagnosed? This condition may be diagnosed based on: Your symptoms and medical history. A physical exam. Imaging tests, such as: X-rays. MRI. Ultrasound. How is this treated? This condition may be treated by: Resting your shoulder and avoiding all activities that cause pain or put stress on the shoulder. Icing your shoulder. NSAIDs to help reduce pain and swelling. One or more injections of medicines to numb the area and reduce inflammation. Physical therapy. Surgery. This may be needed if nonsurgical treatments  have not helped. Surgery may involve repairing the rotator cuff, reshaping the acromion, or removing the bursa. Follow these instructions at home: Managing pain, stiffness, and swelling  If directed, put ice on the injured area. Put ice in a plastic bag. Place a towel between your skin and the bag. Leave the ice on for 20 minutes, 2-3 times a day. Activity Rest and return to your normal activities as told by your health care provider. Ask your health care provider what activities are safe for you. Do exercises as told by your health care provider. General instructions Do not use any products that contain nicotine or tobacco, such as cigarettes, e-cigarettes, and chewing tobacco. These can delay healing. If you need help quitting, ask your health care provider. Ask your health care provider when it is safe for you to drive. Take over-the-counter and prescription medicines only as told by your health care provider. Keep all follow-up visits as told by your health care provider. This is important. How is this prevented? Give your body time to rest between periods of activity. Be safe and responsible while being active. This will help you avoid falls. Maintain physical fitness, including strength and flexibility. Contact a health care provider if: Your symptoms have not improved after 1-2 months of treatment and rest. You cannot lift your arm away from your body. Summary Shoulder impingement syndrome is a condition that causes pain when connective tissues (tendons) surrounding the shoulder joint become pinched. The main symptom of this condition is pain on the front or side of the shoulder. This condition is usually treated with rest, ice, and pain medicines as needed. This information is not intended to replace advice given to you by your health care provider. Make sure you discuss any questions you have with your health care provider. Document Revised: 04/25/2021 Document Reviewed:  04/25/2021 Elsevier Patient Education  [place].

## 2022-03-27 NOTE — Progress Notes (Signed)
Chief Complaint  Patient presents with   Follow-up    F/u to look at xray   F/u  1. Left rotator cuff pain 7-8/10 h/o break collar bone military at age 66 and bone did not fuse right  Tried otc voltaren gel w/o help had urgent visit [facility] and an upcoming visit  Will get ROI today  2. BP low normal today w/o sx's will CC Darylene Price np started [place] 03/13/22 will see if other meds need to be adjusted      Review of Systems  Constitutional:  Negative for weight loss.  HENT:  Negative for hearing loss.   Eyes:  Negative for blurred vision.  Respiratory:  Negative for shortness of breath.   Cardiovascular:  Negative for chest pain.  Gastrointestinal:  Negative for abdominal pain and blood in stool.  Genitourinary:  Negative for dysuria.  Musculoskeletal:  Negative for back pain, falls and joint pain.  Skin:  Negative for rash.  Neurological:  Negative for headaches.  Psychiatric/Behavioral:  Negative for depression.    Past Medical History:  Diagnosis Date   Acute anterior wall MI ([place]) 07/04/1991   a.) LHC: EF 29%; 100% mLAD -> PTCA performed   Anxiety    Aortic atherosclerosis (HCC)    CHF (congestive heart failure) (HCC)    COPD (chronic obstructive pulmonary disease) (HCC)    Coronary artery disease    a.) 3 stents --> 3x18 mm Duet to OM3, 2.5x18 mm Duet to D1, 2.5x13 mm Duet to RPDA. b. 06/2013 PCI: CTO LAD, patent LAD stent, RCA 70p (FFR 0.74--> DES), 40m(DES), RPDA 50%, EF 20%; c. 08/2019 Cath: LM nl, LAD 100p/m, D1 60, D2 patent stent, LCX 60ost/p, 48mSR, RCA 30p, 2081mR.   Family history of lung cancer    HFrEF (heart failure with reduced ejection fraction) ([place]  a. 05/2014 Echo: EF 25-30%; b. 01/2018 Echo ([facility]): EF 30%; c. 06/2021 Echo: EF 25-30%, apical AK, GrI DD, nl RV fxn.   HLD (hyperlipidemia)    HTN (hypertension)    ICD  single,BSX    a. DOI 11/2011; b. S/N# [id number] cardiomyopathy    a. 05/2014 Echo: EF 25-30%; b. 01/2018 Echo ([facility]):  EF 30%, c. 06/2021 Echo: EF 25-30%.   Lateral wall myocardial infarction ([place]2/08/1996   a.) LHC: EF 44%; 75% RPDA and 95% OM3; PCI performed and a 3.0 x 18 mm Duet stent placed to OM3Graysonrm current use of antithrombotics/antiplatelets    a.) DAPT therapy (ASA + clopidogrel)   NSTEMI (non-ST elevated myocardial infarction) ([place]5/07/2011   a.) LHC: 100% mLAD; intervention deferred opting for medical management.   Personal history of colonic polyps    Pneumonia 08/2018   Sepsis ([place]   Sepsis due to group A Streptococcus with acute renal failure and septic shock (HCC)    Syncope and collapse    Ventricular tachycardia ([place]CC[place]5/11/2011   a.) VT arrest; ROSC achieved --> BPotteraced.   Past Surgical History:  Procedure Laterality Date   APPENDECTOMY N/A    CARDIAC DEFIBRILLATOR PLACEMENT N/A 12/01/2011   COLONOSCOPY WITH PROPOFOL N/A 12/22/2020   Procedure: COLONOSCOPY WITH PROPOFOL;  Surgeon: AnnJonathon BellowsD;  [facility];  Service: Gastroenterology;  Laterality: N/A;   CORONARY ANGIOPLASTY Left 07/05/1991   Procedure: CORONARY ANGIOPLASTY; Location: [facility]; Surgeon: HarDoristine BosworthD   CORONARY ANGIOPLASTY Left 08/06/1991   Procedure: CORONARY ANGIOPLASTY; Location: [facility]; Surgeon:  Ellouise Newer, MD   CORONARY ANGIOPLASTY Left 08/09/1991   Procedure: CORONARY ANGIOPLASTY; Location: [facility]; Surgeon: Ellouise Newer, MD   CORONARY ANGIOPLASTY Left 10/28/1991   Procedure: CORONARY ANGIOPLASTY; Location: [facility]; Surgeon: Ellouise Newer, MD   CORONARY ANGIOPLASTY Left 11/02/1991   Procedure: CORONARY ANGIOPLASTY; Location: [facility]; Surgeon: Ellouise Newer, MD   CORONARY ANGIOPLASTY Left 01/10/1992   Procedure: CORONARY ANGIOPLASTY; Location: [facility]; Surgeon: Carles Collet, MD   CORONARY ANGIOPLASTY Left 09/26/1993   Procedure: CORONARY ANGIOPLASTY; Location: [facility]; Surgeon: Melton Alar, MD   CORONARY ANGIOPLASTY Left 09/11/2006   Procedure: CORONARY  ANGIOPLASTY; Location: [facility]   CORONARY ANGIOPLASTY Left 01/17/2010   Procedure: CORONARY ANGIOPLASTY; Location: [facility]; Surgeon: Wendee Beavers, MD   CORONARY ANGIOPLASTY Left 11/27/2011   Procedure: CORONARY ANGIOPLASTY; Location: [facility]; Surgeon: Derinda Sis, MD   CORONARY ANGIOPLASTY WITH STENT PLACEMENT Left 06/29/1997   Procedure: CORONARY ANGIOPLASTY WITH STENT PLACEMENT (3.0 x 18 mm Diet stent to OM3); Location: [facility]; Surgeon: Allie Bossier, MD   CORONARY ANGIOPLASTY WITH STENT PLACEMENT Left 07/12/1998   Procedure: CORONARY ANGIOPLASTY WITH STENT PLACEMENT (2.8 x 8 mm Duet stent to D1, 2.5 x 13 mm Duet stent to RPDA); Location: [facility]   GRAFT APPLICATION Left 01/09/4314   Procedure: GRAFT APPLICATION;  Surgeon: Benjamine Sprague, DO;  Location: [facility];  Service: General;  Laterality: Left;   LEFT HEART CATH AND CORONARY ANGIOGRAPHY Left 08/30/2019   Procedure: LEFT HEART CATH AND CORONARY ANGIOGRAPHY;  Surgeon: Wellington Hampshire, MD;  Location: [place] [facility];  Service: Cardiovascular;  Laterality: Left;   PROSTATE SURGERY     urethra grew around prostate    TUMOR REMOVAL  2001   chest thymic cyst; behind lungs [facility] benign    Family History  Problem Relation Age of Onset   Cancer - Lung Mother    Hypertension Mother    Heart attack Mother    Cancer Father        larynx   Dementia Maternal Grandmother    Heart attack Paternal Grandfather    Colon cancer Neg Hx    Stomach cancer Neg Hx    Pancreatic cancer Neg Hx    Social History   Socioeconomic History   Marital status: Married    Spouse name: Hilda Blades    Number of children: 2   Years of education: Not on file   Highest education level: Not on file  Occupational History   Occupation: retired   Tobacco Use   Smoking status: Former    Packs/day: 0.50    Years: 42.00    Total pack years: 21.00    Types: Cigarettes    Quit date: 11/10/2019    Years since quitting: 2.3   Smokeless tobacco: Never  Vaping Use   Vaping Use:  Never used  Substance and Sexual Activity   Alcohol use: Yes    Alcohol/week: 8.0 standard drinks of alcohol    Types: 8 Cans of beer per week    Comment: per week   Drug use: No   Sexual activity: Not on file  Other Topics Concern   Not on file  Social History Narrative   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    Lives in [place]    2 kids daughters    Gets regular exercise   Smoker   [employer]    Social Determinants of Health   Financial Resource Strain: Low Risk  (09/05/2021)  Overall Financial Resource Strain (CARDIA)    Difficulty of Paying Living Expenses: Not very hard  Recent Concern: Financial Resource Strain - Medium Risk (08/20/2021)   Overall Financial Resource Strain (CARDIA)    Difficulty of Paying Living Expenses: Somewhat hard  Food Insecurity: No Food Insecurity (09/05/2021)   Hunger Vital Sign    Worried About Running Out of Food in the Last Year: Never true    Ran Out of Food in the Last Year: Never true  Transportation Needs: No Transportation Needs (09/05/2021)   PRAPARE - Hydrologist (Medical): No    Lack of Transportation (Non-Medical): No  Physical Activity: Insufficiently Active (04/16/2021)   Exercise Vital Sign    Days of Exercise per Week: 4 days    Minutes of Exercise per Session: 20 min  Stress: No Stress Concern Present (09/05/2021)   [place]    Feeling of Stress : Not at all  Social Connections: Unknown (04/16/2021)   Social Connection and Isolation Panel [NHANES]    Frequency of Communication with Friends and Family: More than three times a week    Frequency of Social Gatherings with Friends and Family: Not on file    Attends Religious Services: Not on file    Active Member of Clubs or Organizations: Not on file    Attends Archivist Meetings: Not on file    Marital  Status: Not on file  Intimate Partner Violence: Not At Risk (09/05/2021)   Humiliation, Afraid, Rape, and Kick questionnaire    Fear of Current or Ex-Partner: No    Emotionally Abused: No    Physically Abused: No    Sexually Abused: No   Current Meds  Medication Sig   albuterol (VENTOLIN HFA) 108 (90 Base) MCG/ACT inhaler Inhale 1-2 puffs into the lungs every 6 (six) hours as needed for wheezing or shortness of breath.   ALPRAZolam (XANAX) 0.5 MG tablet Take 1 tablet (0.5 mg total) by mouth 3 (three) times daily as needed. for anxiety   amiodarone (PACERONE) 200 MG tablet Take 1 tablet (200 mg total) by mouth daily.   aspirin EC 81 MG tablet Take 81 mg by mouth daily. Swallow whole.   atorvastatin (LIPITOR) 80 MG tablet Take 1 tablet (80 mg total) by mouth daily.   carvedilol (COREG) 3.125 MG tablet Take 1 tablet (3.125 mg total) by mouth 2 (two) times daily.   clopidogrel (PLAVIX) 75 MG tablet Take 1 tablet (75 mg total) by mouth daily.   dapagliflozin propanediol (FARXIGA) 10 MG TABS tablet Take 1 tablet (10 mg total) by mouth daily before breakfast.   ezetimibe (ZETIA) 10 MG tablet Take 1 tablet (10 mg total) by mouth daily.   ipratropium-albuterol (DUONEB) 0.5-2.5 (3) MG/3ML SOLN Take 3 mLs by nebulization every 6 (six) hours as needed.   potassium chloride (KLOR-CON 10) 10 MEQ tablet Take 1 tablet (10 mEq total) by mouth daily.   sacubitril-valsartan (ENTRESTO) 97-103 MG Take 1 tablet by mouth 2 (two) times daily.   spironolactone (ALDACTONE) 25 MG tablet Take 1 tablet (25 mg total) by mouth daily.   tadalafil (CIALIS) 10 MG tablet Take 1-2 tablets (10-20 mg total) by mouth daily as needed for erectile dysfunction.   Tiotropium Bromide-Olodaterol (STIOLTO RESPIMAT) 2.5-2.5 MCG/ACT AERS Inhale 2 puffs into the lungs daily.   torsemide (DEMADEX) 20 MG tablet Take 1 tablet (20 mg) by mouth on M, W, F.  You may take  1 extra tablet (20 mg) by mouth once daily as needed for swelling/ weight  gain/ shortness of breath   traMADol (ULTRAM) 50 MG tablet Take 1 tablet (50 mg total) by mouth every 8 (eight) hours as needed.   Allergies  Allergen Reactions   Nitroglycerin Nausea Only and Other (See Comments)    Per patient "causes severe  headache"   Recent Results (from the past 2160 hour(s))  Vitamin D (25 hydroxy)     Status: Abnormal   Collection Time: 02/27/22  2:18 PM  Result Value Ref Range   VITD 28.31 (L) 30.00 - 100.00 ng/mL  Lipid panel     Status: Abnormal   Collection Time: 02/27/22  2:18 PM  Result Value Ref Range   Cholesterol 122 0 - 200 mg/dL    Comment: ATP III Classification       Desirable:  < 200 mg/dL               Borderline High:  200 - 239 mg/dL          High:  > = 240 mg/dL   Triglycerides 90.0 0.0 - 149.0 mg/dL    Comment: Normal:  <150 mg/dLBorderline High:  150 - 199 mg/dL   HDL 38.30 (L) >39.00 mg/dL   VLDL 18.0 0.0 - 40.0 mg/dL   LDL Cholesterol 66 0 - 99 mg/dL   Total CHOL/HDL Ratio 3     Comment:                Men          Women1/2 Average Risk     3.4          3.3Average Risk          5.0          4.42X Average Risk          9.6          7.13X Average Risk          15.0          11.0                       NonHDL 83.60     Comment: NOTE:  Non-HDL goal should be 30 mg/dL higher than patient's LDL goal (i.e. LDL goal of < 70 mg/dL, would have non-HDL goal of < 100 mg/dL)  IBC + Ferritin     Status: None   Collection Time: 02/27/22  2:18 PM  Result Value Ref Range   Iron 119 42 - 165 ug/dL   Transferrin 243.0 212.0 - 360.0 mg/dL   Saturation Ratios 35.0 20.0 - 50.0 %   Ferritin 96.5 22.0 - 322.0 ng/mL   TIBC 340.2 250.0 - 450.0 mcg/dL  CBC with Differential/Platelet     Status: Abnormal   Collection Time: 02/27/22  2:18 PM  Result Value Ref Range   WBC 6.6 4.0 - 10.5 K/uL   RBC 4.68 4.22 - 5.81 Mil/uL   Hemoglobin 14.1 13.0 - 17.0 g/dL   HCT 41.8 39.0 - 52.0 %   MCV 89.4 78.0 - 100.0 fl   MCHC 33.6 30.0 - 36.0 g/dL   RDW 15.1 11.5 -  15.5 %   Platelets 167.0 150.0 - 400.0 K/uL   Neutrophils Relative % 36.6 (L) 43.0 - 77.0 %   Lymphocytes Relative 46.6 (H) 12.0 - 46.0 %   Monocytes Relative 11.4 3.0 - 12.0 %   Eosinophils Relative 4.2 0.0 -  5.0 %   Basophils Relative 1.2 0.0 - 3.0 %   Neutro Abs 2.4 1.4 - 7.7 K/uL   Lymphs Abs 3.1 0.7 - 4.0 K/uL   Monocytes Absolute 0.8 0.1 - 1.0 K/uL   Eosinophils Absolute 0.3 0.0 - 0.7 K/uL   Basophils Absolute 0.1 0.0 - 0.1 K/uL  Comprehensive metabolic panel     Status: Abnormal   Collection Time: 02/27/22  2:18 PM  Result Value Ref Range   Sodium 135 135 - 145 mEq/L   Potassium 4.5 3.5 - 5.1 mEq/L   Chloride 104 96 - 112 mEq/L   CO2 23 19 - 32 mEq/L   Glucose, Bld 87 70 - 99 mg/dL   BUN 30 (H) 6 - 23 mg/dL   Creatinine, Ser 1.38 0.40 - 1.50 mg/dL   Total Bilirubin 0.5 0.2 - 1.2 mg/dL   Alkaline Phosphatase 83 39 - 117 U/L   AST 13 0 - 37 U/L   ALT 20 0 - 53 U/L   Total Protein 6.8 6.0 - 8.3 g/dL   Albumin 4.2 3.5 - 5.2 g/dL   GFR 52.53 (L) >60.00 mL/min    Comment: Calculated using the CKD-EPI Creatinine Equation (2021)   Calcium 9.0 8.4 - 10.5 mg/dL  CUP PACEART REMOTE DEVICE CHECK     Status: None   Collection Time: 03/12/22  2:21 AM  Result Value Ref Range   Date Time Interrogation Session [id number]    Pulse Generator Manufacturer BOST    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    Pulse Gen Serial Number [id number]    Clinic [facility]    Implantable Pulse Generator Type Implantable Cardiac Defibulator    Implantable Pulse Generator Implant Date [id number]    Implantable Lead Manufacturer BOST    Implantable Lead Model 0292 [employer] Reliance 4-Site SG    Implantable Lead Serial Number [id number]    Implantable Lead Implant Date [id number]    Implantable Lead Location [phone]    Lead Channel Setting Sensing Sensitivity 0.6 mV   Lead Channel Setting Sensing Adaptation Mode Adaptive Sensing    Lead Channel Setting Pacing Pulse Width 0.5 ms   Lead Channel Setting  Pacing Amplitude 2.4 V   Lead Channel Impedance Value 649 ohm   Lead Channel Pacing Threshold Amplitude 0.6 V   Lead Channel Pacing Threshold Pulse Width 0.5 ms   HighPow Impedance 90 ohm   Battery Status BOS    Battery Remaining Longevity 30 mo   Battery Remaining Percentage 36 %   Brady Statistic RV Percent Paced 0 %   Objective  Body mass index is 29.27 kg/m. Wt Readings from Last 3 Encounters:  03/27/22 198 lb 3.2 oz (89.9 kg)  03/15/22 201 lb 8 oz (91.4 kg)  03/13/22 201 lb 4 oz (91.3 kg)   Temp Readings from Last 3 Encounters:  03/27/22 98.2 F (36.8 C) (Oral)  08/24/21 (!) 97 F (36.1 C) (Temporal)  08/17/21 98 F (36.7 C) (Oral)   BP Readings from Last 3 Encounters:  03/27/22 100/60  03/13/22 (!) 118/58  11/02/21 131/79   Pulse Readings from Last 3 Encounters:  03/27/22 (!) 52  03/13/22 (!) 47  11/02/21 (!) 54    Physical Exam Vitals and nursing note reviewed.  Constitutional:      Appearance: Normal appearance. He is well-developed and well-groomed.  HENT:     Head: Normocephalic and atraumatic.  Eyes:     Conjunctiva/sclera: Conjunctivae normal.     Pupils: Pupils are  equal, round, and reactive to light.  Cardiovascular:     Rate and Rhythm: Normal rate and regular rhythm.     Heart sounds: Normal heart sounds.  Pulmonary:     Effort: Pulmonary effort is normal. No respiratory distress.     Breath sounds: Normal breath sounds.  Abdominal:     Tenderness: There is no abdominal tenderness.  Skin:    General: Skin is warm and moist.  Neurological:     General: No focal deficit present.     Mental Status: He is alert and oriented to person, place, and time. Mental status is at baseline.     Sensory: Sensation is intact.     Motor: Motor function is intact.     Coordination: Coordination is intact.     Gait: Gait is intact. Gait normal.  Psychiatric:        Attention and Perception: Attention and perception normal.        Mood and Affect: Mood and  affect normal.        Speech: Speech normal.        Behavior: Behavior normal. Behavior is cooperative.        Thought Content: Thought content normal.        Cognition and Memory: Cognition and memory normal.        Judgment: Judgment normal.     Assessment  Plan  Disorder of left rotator cuff - Plan: traMADol (ULTRAM) 50 MG tablet [facility] ROI today  Hypotension, unspecified hypotension type  low normal today w/o sx's will CC [employer] np started [place] 03/13/22 will see if other meds need to be adjusted I.e CHF meds farxiga is only new Med   HM Flu shot due high dose pna 23 utd prevnar 20 due in future covid vaccine 3/3 pfizer consider booster   Tdap utd 03/03/20  Consider shingrix in the future    hcv 06/29/97 negative   Skin no issues   Colonoscopy [facility]  -5/22 and 6/22 [facility] colonoscopy   PSA sees Dr. Vernice [facility] urology appt sch 05/2020 06/09/20 0.35 -referred [facility] Dr. Diamantina Providence f/u 07/2022    Smoker CT chest 01/19/19 COPD still smoking rec cessation   smoking <1 ppd some times <1/2 ppd or 5-6 cig qd since age 65 y.o smoker and quit x 5 year FH lung cancer in mother trying to get help with chantix  As of 12/28/19 quit smoking x 5 weeks congratulated    02/16/20 CT chest  IMPRESSION: 1. Lung-RADS 2, benign appearance or behavior. Continue annual screening with low-dose chest CT without contrast in 12 months. 2. Aortic atherosclerosis (ICD10-I70.0). Coronary artery calcification. 3.  Emphysema (ICD10-J43.9   06/29/2019 [facility] cerumen impaction hearing loss with hearing aids improved after wax removal      Cards Dr. Rockey Situ and EP Dr. Jolyn Nap , CHF clinic  Provider: Dr. Olivia Mackie McLean-Scocuzza-Internal Medicine

## 2022-03-27 NOTE — Progress Notes (Signed)
Inform pt his BP running low normal today spoke with heart failure clinic  Have pt monitor blood pressure at home and per heart failure clinic for low blood pressure he could:  Alisa Graff, FNP  McLean-Scocuzza, Nino Glow, MD  Take his torsemide ONLY (fluid pill) if needed for weight gain of 5 lbs, shortness of breath, abdominal bloating, leg swelling but if he's not comfortable with that, he could decrease his entresto in half to 1/2 tablet BID   He has a f/u with CHF/heart failure clinic in 04/2022 as well and they can discuss at appt  Darylene Price, [facility]  [phone]

## 2022-04-05 ENCOUNTER — Telehealth: Payer: Self-pay

## 2022-04-05 NOTE — Telephone Encounter (Signed)
LMOM on wifes phone to have her husband give [place] a cb in regards to what FNP, Darylene Price said:   Author: McLean-Scocuzza, Nino Glow, MD Service: Internal Medicine Author Type: Physician  Filed: 03/27/2022  6:55 PM Encounter Date: 03/27/2022 Status: Signed  Editor: McLean-Scocuzza, Nino Glow, MD (Physician)                  Inform pt his BP running low normal today spoke with heart failure clinic   Have pt monitor blood pressure at home and per heart failure clinic for low blood pressure he could:  Alisa Graff, FNP  McLean-Scocuzza, Nino Glow, MD   Take his torsemide ONLY (fluid pill) if needed for weight gain of 5 lbs, shortness of breath, abdominal bloating, leg swelling but if he's not comfortable with that, he could decrease his entresto in half to 1/2 tablet BID    He has a f/u with CHF/heart failure clinic in 04/2022 as well and they can discuss at appt  Darylene Price, [facility]  [phone]

## 2022-04-07 ENCOUNTER — Encounter: Payer: Self-pay | Admitting: Internal Medicine

## 2022-04-07 DIAGNOSIS — M25812 Other specified joint disorders, left shoulder: Secondary | ICD-10-CM | POA: Insufficient documentation

## 2022-04-09 DIAGNOSIS — M75112 Incomplete rotator cuff tear or rupture of left shoulder, not specified as traumatic: Secondary | ICD-10-CM | POA: Diagnosis not present

## 2022-04-09 DIAGNOSIS — M25512 Pain in left shoulder: Secondary | ICD-10-CM | POA: Diagnosis not present

## 2022-04-12 DIAGNOSIS — M75112 Incomplete rotator cuff tear or rupture of left shoulder, not specified as traumatic: Secondary | ICD-10-CM | POA: Diagnosis not present

## 2022-04-12 DIAGNOSIS — M25512 Pain in left shoulder: Secondary | ICD-10-CM | POA: Diagnosis not present

## 2022-04-15 NOTE — Progress Notes (Signed)
Remote ICD transmission.   

## 2022-04-22 DIAGNOSIS — M25512 Pain in left shoulder: Secondary | ICD-10-CM | POA: Diagnosis not present

## 2022-04-22 DIAGNOSIS — M75112 Incomplete rotator cuff tear or rupture of left shoulder, not specified as traumatic: Secondary | ICD-10-CM | POA: Diagnosis not present

## 2022-04-23 NOTE — Progress Notes (Unsigned)
Date:  04/24/2022   ID:  [name], DOB 05-May-1956, MRN [id number]  Patient Location:  [place] [address]   Provider location:   [facility], [place] office  PCP:  McLean-Scocuzza, Nino Glow, MD  Cardiologist:  Patsy Baltimore   Chief Complaint  Patient presents with   6 month follow up     Patient c/o syncopal spell last week. Medications reviewed by the patient verbally.     History of Present Illness:    Ian Mills is a 66 y.o. male past medical history of Smoking, continues to smoke 1 pack a week coronary artery disease,  3 stents placed to his mid LAD, one stent to the PDA, one stent to the OM 3 in 08/2006  chest pressure during his divorce with stress test at [facility],  cardiac catheterization at [facility], 11/2011 syncope x4 episodes total over the past several years ECHO EF 25 to 30%, 2015 ICD, ejection fraction 30-35%, chronic back and hip pain ischemic cardiomyopathy He presents today for follow-up of his coronary artery disease,  Heart catheterization , syncope  Last seen in clinic by myself March 2023 Followed by CHF clinic last seen August 2023, pulse at that time 54  Reports he was weighing daily, torsemide Monday Wednesday Friday, not taking extra torsemide Getting Entresto through [employer] patient assistance  [place] added 02/2022 by CHF clinic  Reports he in general has been feeling well, does lots of kayaking  Had an episode of syncope last week, Sunday, 2-3 in the PM Acute onset, with no warning, was standing in the house, Does not eat much lunch Skinned his arm on falling  Chronic left arm pain, [facility]  Pressures at home systolic 21-224 Denies orthostatic symptoms in general Felt he had been doing okay until episode of syncope as detailed above  Orthostatics checked today systolic pressure 825 supine heart rate 76 pressure down to 100 with standing heart rate 80  EKG  personally reviewed by myself on todays visit Normal sinus rhythm rate 76 bpm consider old anterior MI  catheterization early 2021 1. Significant underlying three-vessel coronary artery disease with chronically occluded mid LAD, patent first diagonal stent, patent RCA stents with mild in-stent restenosis, patent left circumflex stent with moderate in-stent restenosis.  Progression of ostial left circumflex stenosis to 60%. 2.  Left ventricular angiography was not performed due to chronic kidney disease. 3.  Moderately elevated left ventricular end-diastolic pressure between 25 to 30 mmHg. No  revascularization recommended  Quit smoking 08/2020  Stress test August 10, 2019 showing fixed defects anterior wall   Seen in the hospital February 2020 for COPD exacerbation PNA   Previously with back and sciatic pain, improved with cortisone shots, two years ago seen by [facility] for chronic back pain   Past Medical History:  Diagnosis Date   Acute anterior wall MI ([place]) 07/04/1991   a.) LHC: EF 29%; 100% mLAD -> PTCA performed   Anxiety    Aortic atherosclerosis (HCC)    CHF (congestive heart failure) (HCC)    COPD (chronic obstructive pulmonary disease) ([place])    Coronary artery disease    a.) 3 stents --> 3x18 mm Duet to OM3, 2.5x18 mm Duet to D1, 2.5x13 mm Duet to RPDA. b. 06/2013 PCI: CTO LAD, patent LAD stent, RCA 70p (FFR 0.74--> DES), 61m(DES), RPDA 50%, EF 20%; c. 08/2019 Cath: LM nl, LAD 100p/m,  D1 60, D2 patent stent, LCX 60ost/p, 78mISR, RCA 30p, 259mSR.   Family history of lung cancer    HFrEF (heart failure with reduced ejection fraction) ([place]   a. 05/2014 Echo: EF 25-30%; b. 01/2018 Echo ([facility]): EF 30%; c. 06/2021 Echo: EF 25-30%, apical AK, GrI DD, nl RV fxn.   HLD (hyperlipidemia)    HTN (hypertension)    ICD  single,BSX    a. DOI 11/2011; b. S/N# [id number] Ischemic cardiomyopathy    a. 05/2014 Echo: EF 25-30%; b. 01/2018 Echo ([facility]): EF 30%, c. 06/2021 Echo:  EF 25-30%.   Lateral wall myocardial infarction (HCM[place]12/08/1996   a.) LHC: EF 44%; 75% RPDA and 95% OM3; PCI performed and a 3.0 x 18 mm Duet stent placed to [place] current use of antithrombotics/antiplatelets    a.) DAPT therapy (ASA + clopidogrel)   NSTEMI (non-ST elevated myocardial infarction) ([place]05/07/2011   a.) LHC: 100% mLAD; intervention deferred opting for medical management.   Personal history of colonic polyps    Pneumonia 08/2018   Sepsis (HCCarlos01/07/2021   Sepsis due to group A Streptococcus with acute renal failure and septic shock (HCC)    Syncope and collapse    Ventricular tachycardia (HCM[place]05/11/2011   a.) VT arrest; ROSC achieved --> [place].   Past Surgical History:  Procedure Laterality Date   APPENDECTOMY N/A    CARDIAC DEFIBRILLATOR PLACEMENT N/A 12/01/2011   COLONOSCOPY WITH PROPOFOL N/A 12/22/2020   Procedure: COLONOSCOPY WITH PROPOFOL;  Surgeon: AnJonathon BellowsMD;  [facility];  Service: Gastroenterology;  Laterality: N/A;   CORONARY ANGIOPLASTY Left 07/05/1991   Procedure: CORONARY ANGIOPLASTY; Location: [facility]; Surgeon: HaDoristine BosworthMD   CORONARY ANGIOPLASTY Left 08/06/1991   Procedure: CORONARY ANGIOPLASTY; Location: [facility]; Surgeon: ViEllouise NewerMD   CORONARY ANGIOPLASTY Left 08/09/1991   Procedure: CORONARY ANGIOPLASTY; Location: [facility]; Surgeon: ViEllouise NewerMD   CORONARY ANGIOPLASTY Left 10/28/1991   Procedure: CORONARY ANGIOPLASTY; Location: [facility]; Surgeon: ViEllouise NewerMD   CORONARY ANGIOPLASTY Left 11/02/1991   Procedure: CORONARY ANGIOPLASTY; Location: [facility]; Surgeon: ViEllouise NewerMD   CORONARY ANGIOPLASTY Left 01/10/1992   Procedure: CORONARY ANGIOPLASTY; Location: [facility]; Surgeon: RiCarles ColletMD   CORONARY ANGIOPLASTY Left 09/26/1993   Procedure: CORONARY ANGIOPLASTY; Location: [facility]; Surgeon: RiMelton AlarMD   CORONARY ANGIOPLASTY Left 09/11/2006   Procedure: CORONARY ANGIOPLASTY; Location:  [facility]   CORONARY ANGIOPLASTY Left 01/17/2010   Procedure: CORONARY ANGIOPLASTY; Location: [facility]; Surgeon: AnWendee BeaversMD   CORONARY ANGIOPLASTY Left 11/27/2011   Procedure: CORONARY ANGIOPLASTY; Location: [facility]; Surgeon: AnDerinda SisMD   CORONARY ANGIOPLASTY WITH STENT PLACEMENT Left 06/29/1997   Procedure: CORONARY ANGIOPLASTY WITH STENT PLACEMENT (3.0 x 18 mm Diet stent to OM3); Location: [facility]; Surgeon: JaAllie BossierMD   CORONARY ANGIOPLASTY WITH STENT PLACEMENT Left 07/12/1998   Procedure: CORONARY ANGIOPLASTY WITH STENT PLACEMENT (2.8 x 8 mm Duet stent to D1, 2.5 x 13 mm Duet stent to RPDA); Location: [facility]   GRAFT APPLICATION Left 07/31/30/9518 Procedure: GRAFT APPLICATION;  Surgeon: SaBenjamine SpragueDO;  Location: [facility];  Service: General;  Laterality: Left;   LEFT HEART CATH AND CORONARY ANGIOGRAPHY Left 08/30/2019   Procedure: LEFT HEART CATH AND CORONARY ANGIOGRAPHY;  Surgeon: ArWellington HampshireMD;  [facility];  Service: Cardiovascular;  Laterality: Left;   PROSTATE SURGERY     urethra grew around prostate    TUMOR REMOVAL  2001   chest  thymic cyst; behind lungs [facility] benign       Allergies:   Nitroglycerin   Social History   Tobacco Use   Smoking status: Former    Packs/day: 0.50    Years: 42.00    Total pack years: 21.00    Types: Cigarettes    Quit date: 11/10/2019    Years since quitting: 2.4   Smokeless tobacco: Never  Vaping Use   Vaping Use: Never used  Substance Use Topics   Alcohol use: Yes    Alcohol/week: 8.0 standard drinks of alcohol    Types: 8 Cans of beer per week    Comment: per week   Drug use: No     Current Outpatient Medications on File Prior to Visit  Medication Sig Dispense Refill   albuterol (VENTOLIN HFA) 108 (90 Base) MCG/ACT inhaler Inhale 1-2 puffs into the lungs every 6 (six) hours as needed for wheezing or shortness of breath. 18 g 11   ALPRAZolam (XANAX) 0.5 MG tablet Take 1 tablet (0.5 mg total) by mouth 3 (three)  times daily as needed. for anxiety 90 tablet 2   amiodarone (PACERONE) 200 MG tablet Take 1 tablet (200 mg total) by mouth daily. 90 tablet 0   aspirin EC 81 MG tablet Take 81 mg by mouth daily. Swallow whole.     atorvastatin (LIPITOR) 80 MG tablet Take 1 tablet (80 mg total) by mouth daily. 90 tablet 3   carvedilol (COREG) 3.125 MG tablet Take 1 tablet (3.125 mg total) by mouth 2 (two) times daily. 180 tablet 3   clopidogrel (PLAVIX) 75 MG tablet Take 1 tablet (75 mg total) by mouth daily. 90 tablet 3   dapagliflozin propanediol (FARXIGA) 10 MG TABS tablet Take 1 tablet (10 mg total) by mouth daily before breakfast. 90 tablet 3   ezetimibe (ZETIA) 10 MG tablet Take 1 tablet (10 mg total) by mouth daily. 90 tablet 3   ipratropium-albuterol (DUONEB) 0.5-2.5 (3) MG/3ML SOLN Take 3 mLs by nebulization every 6 (six) hours as needed. 360 mL 11   potassium chloride (KLOR-CON 10) 10 MEQ tablet Take 1 tablet (10 mEq total) by mouth daily. 90 tablet 3   sacubitril-valsartan (ENTRESTO) 97-103 MG Take 1 tablet by mouth 2 (two) times daily. 180 tablet 0   spironolactone (ALDACTONE) 25 MG tablet Take 1 tablet (25 mg total) by mouth daily. 90 tablet 3   tadalafil (CIALIS) 10 MG tablet Take 1-2 tablets (10-20 mg total) by mouth daily as needed for erectile dysfunction. 30 tablet 11   Tiotropium Bromide-Olodaterol (STIOLTO RESPIMAT) 2.5-2.5 MCG/ACT AERS Inhale 2 puffs into the lungs daily. 4 g 11   torsemide (DEMADEX) 20 MG tablet Take 1 tablet (20 mg) by mouth on M, W, F.  You may take 1 extra tablet (20 mg) by mouth once daily as needed for swelling/ weight gain/ shortness of breath 135 tablet 5   predniSONE (DELTASONE) 10 MG tablet 6 tablets on Day 1 , then reduce by 1 tablet daily until gone (Patient not taking: Reported on 03/27/2022) 21 tablet 0   traMADol (ULTRAM) 50 MG tablet Take 1 tablet (50 mg total) by mouth every 8 (eight) hours as needed. (Patient not taking: Reported on 04/24/2022) 21 tablet 0   No  current facility-administered medications on file prior to visit.     Family Hx: The patient's family history includes Cancer in his father; Cancer - Lung in his mother; Dementia in his maternal grandmother; Heart attack in his mother and  paternal grandfather; Hypertension in his mother. There is no history of Colon cancer, Stomach cancer, or Pancreatic cancer.  ROS:   Please see the history of present illness.    Review of Systems  Constitutional: Negative.   HENT: Negative.    Respiratory: Negative.    Cardiovascular: Negative.   Gastrointestinal: Negative.   Musculoskeletal: Negative.   Neurological:  Positive for loss of consciousness.  Psychiatric/Behavioral: Negative.    All other systems reviewed and are negative.    Labs/Other Tests and Data Reviewed:    Recent Labs: 07/31/2021: Magnesium 2.4 10/03/2021: TSH 2.320 02/27/2022: ALT 20; BUN 30; Creatinine, Ser 1.38; Hemoglobin 14.1; Platelets 167.0; Potassium 4.5; Sodium 135   Recent Lipid Panel Lab Results  Component Value Date/Time   CHOL 122 02/27/2022 02:18 PM   CHOL 170 12/09/2016 11:03 AM   CHOL 111 01/20/2014 09:29 AM   TRIG 90.0 02/27/2022 02:18 PM   TRIG 88 01/20/2014 09:29 AM   HDL 38.30 (L) 02/27/2022 02:18 PM   HDL 53 12/09/2016 11:03 AM   HDL 39 (L) 01/20/2014 09:29 AM   CHOLHDL 3 02/27/2022 02:18 PM   LDLCALC 66 02/27/2022 02:18 PM   LDLCALC 72 03/03/2020 02:17 PM   LDLCALC 54 01/20/2014 09:29 AM    Wt Readings from Last 3 Encounters:  04/24/22 191 lb (86.6 kg)  03/27/22 198 lb 3.2 oz (89.9 kg)  03/15/22 201 lb 8 oz (91.4 kg)     Exam:    Vital Signs: Vital signs may also be detailed in the HPI BP 100/70 (BP Location: Left Arm, Patient Position: Sitting, Cuff Size: Normal)   Pulse 76   Ht '5\' 8"'$  (1.727 m)   Wt 191 lb (86.6 kg)   SpO2 98%   BMI 29.04 kg/m   Constitutional:  oriented to person, place, and time. No distress.  HENT:  Head: Grossly normal Eyes:  no discharge. No scleral icterus.   Neck: No JVD, no carotid bruits  Cardiovascular: Regular rate and rhythm, no murmurs appreciated Pulmonary/Chest: Clear to auscultation bilaterally, no wheezes or rails Abdominal: Soft.  no distension.  no tenderness.  Musculoskeletal: Normal range of motion Neurological:  normal muscle tone. Coordination normal. No atrophy Skin: Skin warm and dry Psychiatric: normal affect, pleasant   ASSESSMENT & PLAN:    Problem List Items Addressed This Visit       Cardiology Problems   Aortic atherosclerosis (HCC)   Chronic systolic CHF (congestive heart failure) (HCC)   Essential hypertension   Carotid artery stenosis   CAD (coronary artery disease)   Ischemic cardiomyopathy     Other   Single implantable cardioverter-defibrillator-BSx   Other Visit Diagnoses     Chronic systolic congestive heart failure (HCC)    -  Primary   Ventricular tachycardia (HCC)       HFrEF (heart failure with reduced ejection fraction) ([place])         Syncope Etiology unclear, possibly secondary to hypotension ICD downloaded today, no alerts recorded, no ventricular arrhythmia recorded SGLT2 inhibitor added in August We have ordered a CMP today Recommended he cut his Entresto down to 49/51 mg twice daily from 97/103 twice daily Driving precautions discussed  Ischemic cardiomyopathy Recommended he decrease Entresto as above, continue carvedilol 3.125 twice daily, Farxiga 10 mg daily, spironolactone 25 daily, torsemide Monday Wednesday Friday  Essential hypertension Hypotension noted and recent syncope Medication changes as above  History of VT Ventricular tachyarrhythmia during skin graft procedure January 2023,  EP aware , no recent  ICD shocks Download today in the office with Dr. Quentin Ore, no arrhythmia noted  Hyperlipidemia Cholesterol is at goal on the current lipid regimen. No changes to the medications were made.  Coronary disease with stable angina Currently with no symptoms of angina. No  further workup at this time. Continue current medication regimen.  Active, does regular kayaking without anginal symptoms    Total encounter time more than 30 minutes  Greater than 50% was spent in counseling and coordination of care with the patient     Signed, Ida Rogue, [place] [address], [place], [place] [address]

## 2022-04-24 ENCOUNTER — Ambulatory Visit: Payer: Medicare Other | Attending: Cardiovascular Disease | Admitting: Cardiovascular Disease

## 2022-04-24 ENCOUNTER — Encounter: Payer: Self-pay | Admitting: Cardiovascular Disease

## 2022-04-24 ENCOUNTER — Other Ambulatory Visit
Admission: RE | Admit: 2022-04-24 | Discharge: 2022-04-24 | Disposition: A | Discharge status: Home / Self Care | Payer: Medicare Other | Source: Ambulatory Visit | Attending: Cardiovascular Disease | Admitting: Cardiovascular Disease

## 2022-04-24 VITALS — BP 100/70 | HR 76 | Ht 68.0 in | Wt 191.0 lb

## 2022-04-24 DIAGNOSIS — I6523 Occlusion and stenosis of bilateral carotid arteries: Secondary | ICD-10-CM | POA: Diagnosis not present

## 2022-04-24 DIAGNOSIS — I255 Ischemic cardiomyopathy: Secondary | ICD-10-CM | POA: Insufficient documentation

## 2022-04-24 DIAGNOSIS — Z95810 Presence of automatic (implantable) cardiac defibrillator: Secondary | ICD-10-CM | POA: Diagnosis not present

## 2022-04-24 DIAGNOSIS — I472 Ventricular tachycardia, unspecified: Secondary | ICD-10-CM | POA: Insufficient documentation

## 2022-04-24 DIAGNOSIS — I700 Atherosclerosis of aorta: Secondary | ICD-10-CM | POA: Insufficient documentation

## 2022-04-24 DIAGNOSIS — I5020 Unspecified systolic (congestive) heart failure: Secondary | ICD-10-CM | POA: Diagnosis not present

## 2022-04-24 DIAGNOSIS — I5022 Chronic systolic (congestive) heart failure: Secondary | ICD-10-CM | POA: Insufficient documentation

## 2022-04-24 DIAGNOSIS — I10 Essential (primary) hypertension: Secondary | ICD-10-CM | POA: Insufficient documentation

## 2022-04-24 DIAGNOSIS — I25118 Atherosclerotic heart disease of native coronary artery with other forms of angina pectoris: Secondary | ICD-10-CM | POA: Insufficient documentation

## 2022-04-24 LAB — COMPREHENSIVE METABOLIC PANEL
ALT: 26 U/L (ref 0–44)
AST: 20 U/L (ref 15–41)
Albumin: 4 g/dL (ref 3.5–5.0)
Alkaline Phosphatase: 76 U/L (ref 38–126)
Anion gap: 10 (ref 5–15)
BUN: 44 mg/dL — ABNORMAL HIGH (ref 8–23)
CO2: 23 mmol/L (ref 22–32)
Calcium: 9.2 mg/dL (ref 8.9–10.3)
Chloride: 103 mmol/L (ref 98–111)
Creatinine, Ser: 1.72 mg/dL — ABNORMAL HIGH (ref 0.61–1.24)
GFR, Estimated: 43 mL/min — ABNORMAL LOW (ref >60)
Glucose, Bld: 113 mg/dL — ABNORMAL HIGH (ref 70–99)
Potassium: 4.2 mmol/L (ref 3.5–5.1)
Sodium: 136 mmol/L (ref 135–145)
Total Bilirubin: 0.7 mg/dL (ref 0.3–1.2)
Total Protein: 7.8 g/dL (ref 6.5–8.1)

## 2022-04-24 MED ORDER — ENTRESTO 49-51 MG PO TABS
1.0000 | ORAL_TABLET | Freq: Two times a day (BID) | ORAL | 1 refills | Status: DC
Start: 2022-04-24 — End: 2022-10-22

## 2022-04-24 NOTE — Patient Instructions (Addendum)
Medication Instructions:  Please decrease the entresto down to 49/51 mg twice a day (0.5 tablet twice daily)   If you need a refill on your cardiac medications before your next appointment, please call your pharmacy.   Lab work: CMP  Testing/Procedures: No new testing needed  Follow-Up: At [facility], you and your health needs are our priority.  As part of our continuing mission to provide you with exceptional heart care, we have created designated Provider Care Teams.  These Care Teams include your primary Cardiologist (physician) and Advanced Practice Providers (APPs -  Physician Assistants and Nurse Practitioners) who all work together to provide you with the care you need, when you need it.  You will need a follow up appointment in 6 months  Providers on your designated Care Team:   Murray Hodgkins, NP Christell Faith, PA-C Cadence Kathlen Mody, [place]  COVID-19 Vaccine Information can be found at: [email or web address].uk For questions related to vaccine distribution or appointments, please email [email or web address] or call [phone].

## 2022-04-26 ENCOUNTER — Telehealth: Payer: Self-pay | Admitting: *Deleted

## 2022-04-26 NOTE — Telephone Encounter (Signed)
Left message on voicemail to call and schedule follow up LCS CT scan.

## 2022-04-29 ENCOUNTER — Encounter: Payer: Self-pay | Admitting: Family Medicine

## 2022-04-29 ENCOUNTER — Telehealth: Payer: Self-pay | Admitting: Internal Medicine

## 2022-04-29 ENCOUNTER — Ambulatory Visit (INDEPENDENT_AMBULATORY_CARE_PROVIDER_SITE_OTHER): Payer: Medicare Other | Admitting: Family Medicine

## 2022-04-29 VITALS — BP 132/76 | HR 61 | Temp 98.8°F | Ht 68.0 in | Wt 198.6 lb

## 2022-04-29 DIAGNOSIS — Z23 Encounter for immunization: Secondary | ICD-10-CM

## 2022-04-29 DIAGNOSIS — I5022 Chronic systolic (congestive) heart failure: Secondary | ICD-10-CM | POA: Diagnosis not present

## 2022-04-29 DIAGNOSIS — F419 Anxiety disorder, unspecified: Secondary | ICD-10-CM | POA: Diagnosis not present

## 2022-04-29 DIAGNOSIS — I10 Essential (primary) hypertension: Secondary | ICD-10-CM

## 2022-04-29 DIAGNOSIS — M25812 Other specified joint disorders, left shoulder: Secondary | ICD-10-CM | POA: Diagnosis not present

## 2022-04-29 MED ORDER — ALPRAZOLAM 0.5 MG PO TABS: 0.5000 mg | ORAL_TABLET | Freq: Three times a day (TID) | ORAL | 2 refills | Status: DC | PRN

## 2022-04-29 NOTE — Telephone Encounter (Signed)
Called patient per request. No answer. Left message to call office back as needed.

## 2022-04-29 NOTE — Patient Instructions (Signed)
It was a pleasure meeting you today. Thank you for allowing me to take part in your health care.  Our goals for today as we discussed include:  Please call in the name of the Orthopedic doctor you would like the referral sent to for your shoulder.  I have refilled your Xanax 0.5 mg three times a day.  We will try to start to decrease this in the future as we discussed.  Continue to follow up with your Cardiologist.  If you are having issues with the Demadex and low blood pressure on the days you take it you can suggest to take half the dose daily.  You received the Pneumonia and Flu vaccines today You received the first Shingles vaccine today.  Please follow up in 1-2 months in nurse clinic to receive the next dose.  Recommend RSV vaccine.   Please talk to your pharmacist about this.   Please follow-up with PCP in 3 months.  If you have any questions or concerns, please do not hesitate to call the office at [phone].  I look forward to our next visit and until then take care and stay safe.  Regards,   Carollee Leitz, MD   [facility]

## 2022-04-29 NOTE — Progress Notes (Signed)
    SUBJECTIVE:   CHIEF COMPLAINT / HPI: Transfer care  Patient presents to clinic to transfer care.  No acute concerns today  Anxiety Takes Xanax 0.5 mg TID for anxiety.  Has been on medication for 3 years.  Tolerating well.  Denies any SI/HI  Left shoulder pain. Previously had 2 steroid injections.  Would like to have referral to Ortho. Does not feel that previous shoulder injections helped. Denies any fevers, weakness, numbness or tingling. No pain today.    HFrEF Recently seen Cardiology and had a decrease in [place]. Reports having some low blood pressures on days that he takes Demadex. Denies any exertional shortness of breath, chest pain or lower extremity edema today.    PERTINENT  PMH / PSH:  CAD HFrEF HTN   OBJECTIVE:   BP 132/76 (BP Location: Right Arm, Patient Position: Sitting, Cuff Size: Normal)   Pulse 61   Temp 98.8 F (37.1 C) (Oral)   Ht '5\' 8"'$  (1.727 m)   Wt 198 lb 9.6 oz (90.1 kg)   SpO2 98%   BMI 30.20 kg/m    General: Alert, no acute distress Cardio: Normal S1 and S2, RRR, no r/m/g Pulm: CTAB, normal work of breathing Abdomen: Bowel sounds normal. Abdomen soft and non-tender.  Extremities: No peripheral edema.  Neuro: Cranial nerves grossly intact   ASSESSMENT/PLAN:   Anxiety Chronic. Stable.  Tolerating medication well.  Discussed with patient weaning  Xanax in future and agreeable to plan -Continue Alprazolam 0.5 mg BID prn x 90 tabs.  3 refills -Plan to wean at next visit. -Follow up with PCP in 3 months -Strict return precautions provided  Impingement of left shoulder Chronic.  Stable. Followed by [place].  Injections not helping -Patient to [place] name of Orthopedics doctor he would like referral sent to -Follow up as needed  Essential hypertension Well controlled.    Follows with Cardiology.  Compliant with current medications -Continue Entresto -Continue Spironolactone 25 mg daily -Continue Carvedilol 3.125 mg  BID -Continue to follow up with Cardiology   Chronic systolic CHF (congestive heart failure) ([place]) HFrEF with ICD.  Follows with Cardiology -Continue GDMT per cards -Follow up with Cards as scheduled   HCM Flu vaccine today Pneumonia 20 vaccine today Shingrix vaccine today Recommend RSV vaccine  PDMP reviewed  Carollee Leitz, MD

## 2022-04-29 NOTE — Telephone Encounter (Signed)
The patient was seen in the office today for a TOC visit . He stopped to check out and asked if he could talk with [facility].

## 2022-05-02 ENCOUNTER — Ambulatory Visit (INDEPENDENT_AMBULATORY_CARE_PROVIDER_SITE_OTHER): Payer: Medicare Other

## 2022-05-02 VITALS — Ht 68.0 in | Wt 198.0 lb

## 2022-05-02 DIAGNOSIS — Z0000 Encounter for general adult medical examination without abnormal findings: Secondary | ICD-10-CM | POA: Diagnosis not present

## 2022-05-02 NOTE — Patient Instructions (Addendum)
Ian Mills , Thank you for taking time to come for your Medicare Wellness Visit. I appreciate your ongoing commitment to your health goals. Please review the following plan we discussed and let me know if I can assist you in the future.   These are the goals we discussed:  Goals       Patient Stated     Maintain Healthy Lifestyle (pt-stated)      Stay active. Healthy diet.        This is a list of the screening recommended for you and due dates:  Health Maintenance  Topic Date Due   COVID-19 Vaccine (7 - Mixed Product risk series) 05/12/2022*   Yearly kidney health urinalysis for diabetes  04/30/2027*   Zoster (Shingles) Vaccine (2 of 2) 06/24/2022   Yearly kidney function blood test for diabetes  04/25/2023   Tetanus Vaccine  03/03/2030   Colon Cancer Screening  12/23/2030   Pneumonia Vaccine  Completed   Flu Shot  Completed   Hepatitis C Screening: USPSTF Recommendation to screen - Ages 18-79 yo.  Completed   HPV Vaccine  Aged Out   Complete foot exam   Discontinued   Hemoglobin A1C  Discontinued   Eye exam for diabetics  Discontinued  *Topic was postponed. The date shown is not the original due date.    Advanced directives: on file  Conditions/risks identified: none new  Next appointment: Follow up in one year for your annual wellness visit.   Preventive Care 26 Years and Older, Male  Preventive care refers to lifestyle choices and visits with your health care provider that can promote health and wellness. What does preventive care include? A yearly physical exam. This is also called an annual well check. Dental exams once or twice a year. Routine eye exams. Ask your health care provider how often you should have your eyes checked. Personal lifestyle choices, including: Daily care of your teeth and gums. Regular physical activity. Eating a healthy diet. Avoiding tobacco and drug use. Limiting alcohol use. Practicing safe sex. Taking low doses of aspirin every  day. Taking vitamin and mineral supplements as recommended by your health care provider. What happens during an annual well check? The services and screenings done by your health care provider during your annual well check will depend on your age, overall health, lifestyle risk factors, and family history of disease. Counseling  Your health care provider may ask you questions about your: Alcohol use. Tobacco use. Drug use. Emotional well-being. Home and relationship well-being. Sexual activity. Eating habits. History of falls. Memory and ability to understand (cognition). Work and work Statistician. Screening  You may have the following tests or measurements: Height, weight, and BMI. Blood pressure. Lipid and cholesterol levels. These may be checked every 5 years, or more frequently if you are over 4 years old. Skin check. Lung cancer screening. You may have this screening every year starting at age 72 if you have a 30-pack-year history of smoking and currently smoke or have quit within the past 15 years. Fecal occult blood test (FOBT) of the stool. You may have this test every year starting at age 21. Flexible sigmoidoscopy or colonoscopy. You may have a sigmoidoscopy every 5 years or a colonoscopy every 10 years starting at age 22. Prostate cancer screening. Recommendations will vary depending on your family history and other risks. Hepatitis C blood test. Hepatitis B blood test. Sexually transmitted disease (STD) testing. Diabetes screening. This is done by checking your blood sugar (glucose)  after you have not eaten for a while (fasting). You may have this done every 1-3 years. Abdominal aortic aneurysm (AAA) screening. You may need this if you are a current or former smoker. Osteoporosis. You may be screened starting at age 43 if you are at high risk. Talk with your health care provider about your test results, treatment options, and if necessary, the need for more  tests. Vaccines  Your health care provider may recommend certain vaccines, such as: Influenza vaccine. This is recommended every year. Tetanus, diphtheria, and acellular pertussis (Tdap, Td) vaccine. You may need a Td booster every 10 years. Zoster vaccine. You may need this after age 26. Pneumococcal 13-valent conjugate (PCV13) vaccine. One dose is recommended after age 3. Pneumococcal polysaccharide (PPSV23) vaccine. One dose is recommended after age 30. Talk to your health care provider about which screenings and vaccines you need and how often you need them. This information is not intended to replace advice given to you by your health care provider. Make sure you discuss any questions you have with your health care provider. Document Released: 08/11/2015 Document Revised: 04/03/2016 Document Reviewed: 05/16/2015 Elsevier Interactive Patient Education  2017 [place] Prevention in the Home Falls can cause injuries. They can happen to people of all ages. There are many things you can do to make your home safe and to help prevent falls. What can I do on the outside of my home? Regularly fix the edges of walkways and driveways and fix any cracks. Remove anything that might make you trip as you walk through a door, such as a raised step or threshold. Trim any bushes or trees on the path to your home. Use bright outdoor lighting. Clear any walking paths of anything that might make someone trip, such as rocks or tools. Regularly check to see if handrails are loose or broken. Make sure that both sides of any steps have handrails. Any raised decks and porches should have guardrails on the edges. Have any leaves, snow, or ice cleared regularly. Use sand or salt on walking paths during winter. Clean up any spills in your garage right away. This includes oil or grease spills. What can I do in the bathroom? Use night lights. Install grab bars by the toilet and in the tub and shower.  Do not use towel bars as grab bars. Use non-skid mats or decals in the tub or shower. If you need to sit down in the shower, use a plastic, non-slip stool. Keep the floor dry. Clean up any water that spills on the floor as soon as it happens. Remove soap buildup in the tub or shower regularly. Attach bath mats securely with double-sided non-slip rug tape. Do not have throw rugs and other things on the floor that can make you trip. What can I do in the bedroom? Use night lights. Make sure that you have a light by your bed that is easy to reach. Do not use any sheets or blankets that are too big for your bed. They should not hang down onto the floor. Have a firm chair that has side arms. You can use this for support while you get dressed. Do not have throw rugs and other things on the floor that can make you trip. What can I do in the kitchen? Clean up any spills right away. Avoid walking on wet floors. Keep items that you use a lot in easy-to-reach places. If you need to reach something above you, use a  strong step stool that has a grab bar. Keep electrical cords out of the way. Do not use floor polish or wax that makes floors slippery. If you must use wax, use non-skid floor wax. Do not have throw rugs and other things on the floor that can make you trip. What can I do with my stairs? Do not leave any items on the stairs. Make sure that there are handrails on both sides of the stairs and use them. Fix handrails that are broken or loose. Make sure that handrails are as long as the stairways. Check any carpeting to make sure that it is firmly attached to the stairs. Fix any carpet that is loose or worn. Avoid having throw rugs at the top or bottom of the stairs. If you do have throw rugs, attach them to the floor with carpet tape. Make sure that you have a light switch at the top of the stairs and the bottom of the stairs. If you do not have them, ask someone to add them for you. What else  can I do to help prevent falls? Wear shoes that: Do not have high heels. Have rubber bottoms. Are comfortable and fit you well. Are closed at the toe. Do not wear sandals. If you use a stepladder: Make sure that it is fully opened. Do not climb a closed stepladder. Make sure that both sides of the stepladder are locked into place. Ask someone to hold it for you, if possible. Clearly mark and make sure that you can see: Any grab bars or handrails. First and last steps. Where the edge of each step is. Use tools that help you move around (mobility aids) if they are needed. These include: Canes. Walkers. Scooters. Crutches. Turn on the lights when you go into a dark area. Replace any light bulbs as soon as they burn out. Set up your furniture so you have a clear path. Avoid moving your furniture around. If any of your floors are uneven, fix them. If there are any pets around you, be aware of where they are. Review your medicines with your doctor. Some medicines can make you feel dizzy. This can increase your chance of falling. Ask your doctor what other things that you can do to help prevent falls. This information is not intended to replace advice given to you by your health care provider. Make sure you discuss any questions you have with your health care provider. Document Released: 05/11/2009 Document Revised: 12/21/2015 Document Reviewed: 08/19/2014 Elsevier Interactive Patient Education  2017 [employer].

## 2022-05-02 NOTE — Progress Notes (Signed)
Subjective:   Ian Mills is a 66 y.o. male who presents for Medicare Annual/Subsequent preventive examination.  Review of Systems    No ROS.  Medicare Wellness Virtual Visit.  Visual/audio telehealth visit, UTA vital signs.   See social history for additional risk factors.   Cardiac Risk Factors include: advanced age (>26mn, >>55women);male gender     Objective:    Today's Vitals   05/02/22 1340  Weight: 198 lb (89.8 kg)  Height: '5\' 8"'$  (1.727 m)   Body mass index is 30.11 kg/m.     05/02/2022    1:44 PM 09/05/2021    3:44 PM 08/24/2021    7:29 AM 07/30/2021    7:55 AM 07/29/2021    6:36 PM 04/16/2021    1:51 PM 12/22/2020    7:35 AM  Advanced Directives  Does Patient Have a Medical Advance Directive? Yes Yes No  No Yes Yes  Type of AParamedicof AChadwicksLiving will HH[place]lockLiving will    HColwynLiving will HSouth WhitleyLiving will  Does patient want to make changes to medical advance directive? No - Patient declined No - Patient declined    No - Patient declined   Copy of HFlora Vistain Chart? Yes - validated most recent copy scanned in chart (See row information) Yes - validated most recent copy scanned in chart (See row information)    Yes - validated most recent copy scanned in chart (See row information) No - copy requested  Would patient like information on creating a medical advance directive?   No - Patient declined No - Patient declined       Current Medications (verified) Outpatient Encounter Medications as of 05/02/2022  Medication Sig   albuterol (VENTOLIN HFA) 108 (90 Base) MCG/ACT inhaler Inhale 1-2 puffs into the lungs every 6 (six) hours as needed for wheezing or shortness of breath.   ALPRAZolam (XANAX) 0.5 MG tablet Take 1 tablet (0.5 mg total) by mouth 3 (three) times daily as needed for anxiety. for anxiety   amiodarone (PACERONE) 200 MG tablet Take 1 tablet  (200 mg total) by mouth daily.   aspirin EC 81 MG tablet Take 81 mg by mouth daily. Swallow whole.   atorvastatin (LIPITOR) 80 MG tablet Take 1 tablet (80 mg total) by mouth daily.   carvedilol (COREG) 3.125 MG tablet Take 1 tablet (3.125 mg total) by mouth 2 (two) times daily.   clopidogrel (PLAVIX) 75 MG tablet Take 1 tablet (75 mg total) by mouth daily.   dapagliflozin propanediol (FARXIGA) 10 MG TABS tablet Take 1 tablet (10 mg total) by mouth daily before breakfast.   ezetimibe (ZETIA) 10 MG tablet Take 1 tablet (10 mg total) by mouth daily.   ipratropium-albuterol (DUONEB) 0.5-2.5 (3) MG/3ML SOLN Take 3 mLs by nebulization every 6 (six) hours as needed.   potassium chloride (KLOR-CON 10) 10 MEQ tablet Take 1 tablet (10 mEq total) by mouth daily.   sacubitril-valsartan (ENTRESTO) 49-51 MG Take 1 tablet by mouth 2 (two) times daily.   spironolactone (ALDACTONE) 25 MG tablet Take 1 tablet (25 mg total) by mouth daily.   tadalafil (CIALIS) 10 MG tablet Take 1-2 tablets (10-20 mg total) by mouth daily as needed for erectile dysfunction.   Tiotropium Bromide-Olodaterol (STIOLTO RESPIMAT) 2.5-2.5 MCG/ACT AERS Inhale 2 puffs into the lungs daily.   torsemide (DEMADEX) 20 MG tablet Take 1 tablet (20 mg) by mouth on M, W, F.  You may take 1 extra tablet (20 mg) by mouth once daily as needed for swelling/ weight gain/ shortness of breath   No facility-administered encounter medications on file as of 05/02/2022.    Allergies (verified) Nitroglycerin   History: Past Medical History:  Diagnosis Date   Acute anterior wall MI ([place]) 07/04/1991   a.) LHC: EF 29%; 100% mLAD -> PTCA performed   Anxiety    Aortic atherosclerosis (HCC)    CHF (congestive heart failure) (HCC)    COPD (chronic obstructive pulmonary disease) (HCC)    Coronary artery disease    a.) 3 stents --> 3x18 mm Duet to OM3, 2.5x18 mm Duet to D1, 2.5x13 mm Duet to RPDA. b. 06/2013 PCI: CTO LAD, patent LAD stent, RCA 70p (FFR 0.74-->  DES), 73m(DES), RPDA 50%, EF 20%; c. 08/2019 Cath: LM nl, LAD 100p/m, D1 60, D2 patent stent, LCX 60ost/p, 448mSR, RCA 30p, 205mR.   Family history of lung cancer    HFrEF (heart failure with reduced ejection fraction) ([place]  a. 05/2014 Echo: EF 25-30%; b. 01/2018 Echo ([facility]): EF 30%; c. 06/2021 Echo: EF 25-30%, apical AK, GrI DD, nl RV fxn.   HLD (hyperlipidemia)    HTN (hypertension)    ICD  single,BSX    a. DOI 11/2011; b. S/N# [id number] cardiomyopathy    a. 05/2014 Echo: EF 25-30%; b. 01/2018 Echo ([facility]): EF 30%, c. 06/2021 Echo: EF 25-30%.   Lateral wall myocardial infarction ([place]   a.) LHC: EF 44%; 75% RPDA and 95% OM3; PCI performed and a 3.0 x 18 mm Duet stent placed to OM3Altamontrm current use of antithrombotics/antiplatelets    a.) DAPT therapy (ASA + clopidogrel)   NSTEMI (non-ST elevated myocardial infarction) ([place]/07/2011   a.) LHC: 100% mLAD; intervention deferred opting for medical management.   Personal history of colonic polyps    Pneumonia 08/2018   Sepsis ([place]/07/2021   Sepsis due to group A Streptococcus with acute renal failure and septic shock (HCC)    Syncope and collapse    Ventricular tachycardia ([facility]CCFort [place]5/11/2011   a.) VT arrest; ROSC achieved --> BStonewallaced.   Past Surgical History:  Procedure Laterality Date   APPENDECTOMY N/A    CARDIAC DEFIBRILLATOR PLACEMENT N/A 12/01/2011   COLONOSCOPY WITH PROPOFOL N/A 12/22/2020   Procedure: COLONOSCOPY WITH PROPOFOL;  Surgeon: AnnJonathon BellowsD;  [facility];  Service: Gastroenterology;  Laterality: N/A;   CORONARY ANGIOPLASTY Left 07/05/1991   Procedure: CORONARY ANGIOPLASTY; Location: [facility]; Surgeon: HarDoristine BosworthD   CORONARY ANGIOPLASTY Left 08/06/1991   Procedure: CORONARY ANGIOPLASTY; Location: [facility]; Surgeon: VicEllouise NewerD   CORONARY ANGIOPLASTY Left 08/09/1991   Procedure: CORONARY ANGIOPLASTY; Location: [facility]; Surgeon: VicEllouise NewerD    CORONARY ANGIOPLASTY Left 10/28/1991   Procedure: CORONARY ANGIOPLASTY; Location: [facility]; Surgeon: VicEllouise NewerD   CORONARY ANGIOPLASTY Left 11/02/1991   Procedure: CORONARY ANGIOPLASTY; Location: [facility]; Surgeon: VicEllouise NewerD   CORONARY ANGIOPLASTY Left 01/10/1992   Procedure: CORONARY ANGIOPLASTY; Location: [facility]; Surgeon: RicCarles ColletD   CORONARY ANGIOPLASTY Left 09/26/1993   Procedure: CORONARY ANGIOPLASTY; Location: [facility]; Surgeon: RicMelton AlarD   CORONARY ANGIOPLASTY Left 09/11/2006   Procedure: CORONARY ANGIOPLASTY; Location: [facility]   CORONARY ANGIOPLASTY Left 01/17/2010   Procedure: CORONARY ANGIOPLASTY; Location: [facility]; Surgeon: AndWendee BeaversD   CORONARY ANGIOPLASTY Left 11/27/2011   Procedure: CORONARY ANGIOPLASTY; Location: [facility]; Surgeon: AndDerinda SisD   CORONARY ANGIOPLASTY WITH  STENT PLACEMENT Left 06/29/1997   Procedure: CORONARY ANGIOPLASTY WITH STENT PLACEMENT (3.0 x 18 mm Diet stent to OM3); Location: [facility]; Surgeon: Allie Bossier, MD   CORONARY ANGIOPLASTY WITH STENT PLACEMENT Left 07/12/1998   Procedure: CORONARY ANGIOPLASTY WITH STENT PLACEMENT (2.8 x 8 mm Duet stent to D1, 2.5 x 13 mm Duet stent to RPDA); Location: [facility]   GRAFT APPLICATION Left 3/76/2831   Procedure: GRAFT APPLICATION;  Surgeon: Benjamine Sprague, DO;  Location: [facility];  Service: General;  Laterality: Left;   LEFT HEART CATH AND CORONARY ANGIOGRAPHY Left 08/30/2019   Procedure: LEFT HEART CATH AND CORONARY ANGIOGRAPHY;  Surgeon: Wellington Hampshire, MD;  Location: [facility];  Service: Cardiovascular;  Laterality: Left;   PROSTATE SURGERY     urethra grew around prostate    TUMOR REMOVAL  2001   chest thymic cyst; behind lungs [facility] benign    Family History  Problem Relation Age of Onset   Cancer - Lung Mother    Hypertension Mother    Heart attack Mother    Cancer Father        larynx   Dementia Maternal Grandmother    Heart attack Paternal Grandfather    Colon cancer Neg Hx     Stomach cancer Neg Hx    Pancreatic cancer Neg Hx    Social History   Socioeconomic History   Marital status: Legally Separated    Spouse name: Hilda Blades    Number of children: 2   Years of education: Not on file   Highest education level: Not on file  Occupational History   Occupation: retired   Tobacco Use   Smoking status: Former    Packs/day: 0.50    Years: 42.00    Total pack years: 21.00    Types: Cigarettes    Quit date: 11/10/2019    Years since quitting: 2.4   Smokeless tobacco: Never  Vaping Use   Vaping Use: Never used  Substance and Sexual Activity   Alcohol use: Yes    Alcohol/week: 4.0 standard drinks of alcohol    Types: 4 Cans of beer per week    Comment: per week   Drug use: No   Sexual activity: Not on file  Other Topics Concern   Not on file  Social History Narrative   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    Lives in [place]    2 kids daughters    Gets regular exercise   Smoker   [employer]    Social Determinants of Health   Financial Resource Strain: Low Risk  (05/02/2022)   Overall Financial Resource Strain (CARDIA)    Difficulty of Paying Living Expenses: Not hard at all  Food Insecurity: No Food Insecurity (05/02/2022)   Hunger Vital Sign    Worried About Running Out of Food in the Last Year: Never true    Ran Out of Food in the Last Year: Never true  Transportation Needs: No Transportation Needs (05/02/2022)   PRAPARE - Hydrologist (Medical): No    Lack of Transportation (Non-Medical): No  Physical Activity: Insufficiently Active (05/02/2022)   Exercise Vital Sign    Days of Exercise per Week: 4 days    Minutes of Exercise per Session: 20 min  Stress: No Stress Concern Present (05/02/2022)   [place]    Feeling  of Stress : Not at all  Social Connections: Unknown (05/02/2022)    Social Connection and Isolation Panel [NHANES]    Frequency of Communication with Friends and Family: Not on file    Frequency of Social Gatherings with Friends and Family: Not on file    Attends Religious Services: Not on Advertising copywriter or Organizations: Not on file    Attends Archivist Meetings: Not on file    Marital Status: Married    Tobacco Counseling Counseling given: Not Answered   Clinical Intake:  Pre-visit preparation completed: Yes        Diabetes: No  How often do you need to have someone help you when you read instructions, pamphlets, or other written materials from your doctor or pharmacy?: 1 - Never  Interpreter Needed?: No    Activities of Daily Living    05/02/2022    1:46 PM 03/13/2022    8:25 AM  In your present state of health, do you have any difficulty performing the following activities:  Hearing? 0 0  Vision? 0 0  Difficulty concentrating or making decisions? 0 0  Walking or climbing stairs? 0 0  Dressing or bathing? 0 0  Doing errands, shopping? 0 0  Preparing Food and eating ? N   Using the Toilet? N   In the past six months, have you accidently leaked urine? N   Do you have problems with loss of bowel control? N   Managing your Medications? N   Managing your Finances? N   Housekeeping or managing your Housekeeping? N     Patient Care Team: Carollee Leitz, MD as PCP - General (Family Medicine) Minna Merritts, MD as PCP - Cardiology (Cardiology) Deboraha Sprang, MD as PCP - Electrophysiology (Cardiology) Minna Merritts, MD as Consulting Physician (Cardiology)  Indicate any recent Medical Services you may have received from other than [facility] providers in the past year (date may be approximate).     Assessment:   This is a routine wellness examination for Ian Mills.  I connected with  [name] on 05/02/22 by a audio enabled telemedicine application and verified that I am speaking with the correct  person using two identifiers.  Patient Location: Home  Provider Location: Office/Clinic  I discussed the limitations of evaluation and management by telemedicine. The patient expressed understanding and agreed to proceed.   Hearing/Vision screen Hearing Screening - Comments:: Patient is able to hear conversational tones without difficulty. No issues reported. Vision Screening - Comments:: Wears corrective lenses  They have seen their ophthalmologist in the last 12 months.   Dietary issues and exercise activities discussed: Current Exercise Habits: Home exercise routine, Type of exercise: walking, Time (Minutes): 20, Frequency (Times/Week): 4, Weekly Exercise (Minutes/Week): 80, Intensity: Mild   Goals Addressed               This Visit's Progress     Patient Stated     Maintain Healthy Lifestyle (pt-stated)        Stay active. Healthy diet.       Depression Screen    05/02/2022    1:43 PM 04/29/2022   11:11 AM 03/27/2022    1:12 PM 03/15/2022   10:04 AM 02/08/2022   10:49 AM 09/05/2021    3:59 PM 04/16/2021   12:57 PM  PHQ 2/9 Scores  PHQ - 2 Score 0 0 0 0 0 0 0    Fall Risk    05/02/2022  1:45 PM 04/29/2022   11:11 AM 03/27/2022    1:12 PM 03/15/2022   10:04 AM 03/13/2022    8:25 AM  Fall Risk   Falls in the past year?  1 0 0 0  Number falls in past yr:  0 0  0  Injury with Fall?  0 0  0  Risk for fall due to : No Fall Risks History of fall(s) No Fall Risks No Fall Risks   Follow up Falls evaluation completed Falls evaluation completed Falls evaluation completed Falls evaluation completed Falls evaluation completed    [place]: Home free of loose throw rugs in walkways, pet beds, electrical cords, etc? Yes  Adequate lighting in your home to reduce risk of falls? Yes   ASSISTIVE DEVICES UTILIZED TO PREVENT FALLS: Life alert? No  Use of a cane, walker or w/c? No   TIMED UP AND GO: Was the test performed? No .   Cognitive  Function:        05/02/2022    1:52 PM 04/13/2020   12:49 PM 04/13/2019   12:56 PM  6CIT Screen  What Year? 0 points 0 points 0 points  What month? 0 points 0 points 0 points  What time? 0 points 0 points 0 points  Count back from 20 0 points  0 points  Months in reverse 0 points  0 points  Repeat phrase 0 points    Total Score 0 points      Immunizations Immunization History  Administered Date(s) Administered   Fluad Quad(high Dose 65+) 06/10/2019, 06/09/2020, 04/29/2022   Influenza,inj,Quad PF,6+ Mos 06/26/2016   Influenza-Unspecified 06/29/2015   Moderna Covid-19 Vaccine Bivalent Booster 19yr & up 12/15/2019, 01/12/2020, 05/30/2020   PFIZER(Purple Top)SARS-COV-2 Vaccination 12/22/2019, 01/12/2020, 06/02/2020   PNEUMOCOCCAL CONJUGATE-20 04/29/2022   Pneumococcal Polysaccharide-23 06/29/2015, 09/09/2018   Tdap 04/22/2019, 03/03/2020   Zoster Recombinat (Shingrix) 04/29/2022   Zoster, Live 12/29/2013   Screening Tests Health Maintenance  Topic Date Due   COVID-19 Vaccine (7 - Mixed Product risk series) 05/12/2022 (Originally 07/28/2020)   Diabetic kidney evaluation - Urine ACR  04/30/2027 (Originally 03/03/2021)   Zoster Vaccines- Shingrix (2 of 2) 06/24/2022   Diabetic kidney evaluation - GFR measurement  04/25/2023   TETANUS/TDAP  03/03/2030   COLONOSCOPY (Pts 45-415yrInsurance coverage will need to be confirmed)  12/23/2030   Pneumonia Vaccine 6549Years old  Completed   INFLUENZA VACCINE  Completed   Hepatitis C Screening  Completed   HPV VACCINES  Aged Out   FOOT EXAM  Discontinued   HEMOGLOBIN A1C  Discontinued   OPHTHALMOLOGY EXAM  Discontinued   Health Maintenance There are no preventive care reminders to display for this patient.  Lung Cancer Screening: (Low Dose CT Chest recommended if Age 68-80ears, 30 pack-year currently smoking OR have quit w/in 15years.) does not qualify.   Hepatitis C Screening: Completed 1998.  Vision Screening: Recommended annual  ophthalmology exams for early detection of glaucoma and other disorders of the eye.  Dental Screening: Recommended annual dental exams for proper oral hygiene  Community Resource Referral / Chronic Care Management: CRR required this visit?  No   CCM required this visit?  No      Plan:     I have personally reviewed and noted the following in the patient's chart:   Medical and social history Use of alcohol, tobacco or illicit drugs  Current medications and supplements including opioid prescriptions. Patient is not currently taking  opioid prescriptions. Functional ability and status Nutritional status Physical activity Advanced directives List of other physicians Hospitalizations, surgeries, and ER visits in previous 12 months Vitals Screenings to include cognitive, depression, and falls Referrals and appointments  In addition, I have reviewed and discussed with patient certain preventive protocols, quality metrics, and best practice recommendations. A written personalized care plan for preventive services as well as general preventive health recommendations were provided to patient.     Varney Biles, LPN   [id number]

## 2022-05-03 ENCOUNTER — Encounter: Payer: Self-pay | Admitting: Family Medicine

## 2022-05-03 NOTE — Assessment & Plan Note (Signed)
Chronic. Stable.  Tolerating medication well.  Discussed with patient weaning  Xanax in future and agreeable to plan -Continue Alprazolam 0.5 mg BID prn x 90 tabs.  3 refills -Plan to wean at next visit. -Follow up with PCP in 3 months -Strict return precautions provided

## 2022-05-03 NOTE — Assessment & Plan Note (Signed)
Chronic.  Stable. Followed by [facility].  Injections not helping -Patient to [facility] name of Orthopedics doctor he would like referral sent to -Follow up as needed

## 2022-05-03 NOTE — Assessment & Plan Note (Signed)
HFrEF with ICD.  Follows with Cardiology -Continue GDMT per cards -Follow up with Cards as scheduled

## 2022-05-03 NOTE — Assessment & Plan Note (Addendum)
Well controlled.    Follows with Cardiology.  Compliant with current medications -Continue Entresto -Continue Spironolactone 25 mg daily -Continue Carvedilol 3.125 mg BID -Continue to follow up with Cardiology

## 2022-05-09 ENCOUNTER — Telehealth: Payer: Self-pay | Admitting: Family Medicine

## 2022-05-09 NOTE — Telephone Encounter (Signed)
Patient is already scheduled with Dr. Caryl Bis on 04/10/22

## 2022-05-09 NOTE — Telephone Encounter (Signed)
Pt called in stating his left arm is hurting and BP is running low. Sent to access nurse.

## 2022-05-09 NOTE — Telephone Encounter (Signed)
Patient states he spoke with Access Nurse and she told him he needs to have an appointment with [place] either today or tomorrow and he needs to get a referral for an orthopaedic doctor.

## 2022-05-10 ENCOUNTER — Ambulatory Visit (INDEPENDENT_AMBULATORY_CARE_PROVIDER_SITE_OTHER): Payer: Medicare Other | Admitting: Family Medicine

## 2022-05-10 ENCOUNTER — Encounter: Payer: Self-pay | Admitting: Family Medicine

## 2022-05-10 VITALS — BP 120/70 | HR 67 | Temp 98.2°F | Ht 68.0 in | Wt 191.6 lb

## 2022-05-10 DIAGNOSIS — R55 Syncope and collapse: Secondary | ICD-10-CM

## 2022-05-10 DIAGNOSIS — M546 Pain in thoracic spine: Secondary | ICD-10-CM

## 2022-05-10 DIAGNOSIS — L03116 Cellulitis of left lower limb: Secondary | ICD-10-CM | POA: Insufficient documentation

## 2022-05-10 DIAGNOSIS — M25512 Pain in left shoulder: Secondary | ICD-10-CM

## 2022-05-10 DIAGNOSIS — M25812 Other specified joint disorders, left shoulder: Secondary | ICD-10-CM

## 2022-05-10 DIAGNOSIS — I255 Ischemic cardiomyopathy: Secondary | ICD-10-CM | POA: Diagnosis not present

## 2022-05-10 DIAGNOSIS — M549 Dorsalgia, unspecified: Secondary | ICD-10-CM | POA: Insufficient documentation

## 2022-05-10 HISTORY — DX: Cellulitis of left lower limb: L03.116

## 2022-05-10 NOTE — Assessment & Plan Note (Addendum)
Patient likely has a torn rotator cuff and also has frozen shoulder at this time.  Discussed the need for imaging and given that he cannot have an MRI he will need to see orthopedics to determine the best way to evaluate this.  Referral placed to [facility] in [place] for further management evaluation of this as he does not want to continue to see [facility].  Discussed shoulder rotations while dangling his arm to help with range of motion and reducing risk of further progression of frozen shoulder.

## 2022-05-10 NOTE — Patient Instructions (Signed)
If you do not hear from orthopedics in the next week please let us know.

## 2022-05-10 NOTE — Assessment & Plan Note (Signed)
Patient had a syncopal episode previously.  Cardiology downloaded his ICD and did not note any discharges or arrhythmias.  It sounds as though they may have thought it was related to drop in blood pressure and they reduced his Entresto.  Patient will continue to monitor his blood pressure and if he continues to have blood pressures in the 90s over 60s or if he has lightheadedness or passes out again he will contact cardiology immediately.

## 2022-05-10 NOTE — Assessment & Plan Note (Signed)
Patient injured his back when he passed out.  He has no bony tenderness.  Suspect muscular injury that has been improving.  If it does not continue to improve or if it worsens he will let us know.

## 2022-05-10 NOTE — Progress Notes (Signed)
Tommi Rumps, MD Phone: [phone]  Ian Mills is a 66 y.o. male who presents today for same day visit.   Left shoulder pain: Patient notes this has been going on for 5 weeks.  [facility] and notes he did physical therapy for 3 weeks and had 2 cortisone injections in the shoulder.  He just hurts anteriorly and down into his ribs.  He notes he had x-ray imaging.  He has a defibrillator in place and notes he cannot have an MRI.  He notes the pain has progressively gotten worse.  Notes he is unable to abduct it or do full range of motion without pain.  CHF/hypertension: Patient notes his blood pressure had been getting low.  He has been taking carvedilol, Farxiga, Entresto, Aldactone, and torsemide.  He notes a syncopal episode where he thought his defibrillator went off and he subsequently saw cardiology and notes they cut his Entresto in half.  He notes he had recently been started on Farxiga.  He notes occasionally now his blood pressure will get to 90s over 60s and he will feel a little low energy though has had recurrent syncopal episodes.  Back pain: Patient notes when he passed out he landed on his back and had some discomfort there that this has been progressively improving.  No radiation, numbness, or weakness associated with the pain in his back.  He noted no midline pain.  Social History   Tobacco Use  Smoking Status Former   Packs/day: 0.50   Years: 42.00   Total pack years: 21.00   Types: Cigarettes   Quit date: 11/10/2019   Years since quitting: 2.4  Smokeless Tobacco Never    Current Outpatient Medications on File Prior to Visit  Medication Sig Dispense Refill   albuterol (VENTOLIN HFA) 108 (90 Base) MCG/ACT inhaler Inhale 1-2 puffs into the lungs every 6 (six) hours as needed for wheezing or shortness of breath. 18 g 11   ALPRAZolam (XANAX) 0.5 MG tablet Take 1 tablet (0.5 mg total) by mouth 3 (three) times daily as needed for anxiety. for  anxiety 90 tablet 2   amiodarone (PACERONE) 200 MG tablet Take 1 tablet (200 mg total) by mouth daily. 90 tablet 0   aspirin EC 81 MG tablet Take 81 mg by mouth daily. Swallow whole.     atorvastatin (LIPITOR) 80 MG tablet Take 1 tablet (80 mg total) by mouth daily. 90 tablet 3   carvedilol (COREG) 3.125 MG tablet Take 1 tablet (3.125 mg total) by mouth 2 (two) times daily. 180 tablet 3   clopidogrel (PLAVIX) 75 MG tablet Take 1 tablet (75 mg total) by mouth daily. 90 tablet 3   dapagliflozin propanediol (FARXIGA) 10 MG TABS tablet Take 1 tablet (10 mg total) by mouth daily before breakfast. 90 tablet 3   ezetimibe (ZETIA) 10 MG tablet Take 1 tablet (10 mg total) by mouth daily. 90 tablet 3   ipratropium-albuterol (DUONEB) 0.5-2.5 (3) MG/3ML SOLN Take 3 mLs by nebulization every 6 (six) hours as needed. 360 mL 11   potassium chloride (KLOR-CON 10) 10 MEQ tablet Take 1 tablet (10 mEq total) by mouth daily. 90 tablet 3   sacubitril-valsartan (ENTRESTO) 49-51 MG Take 1 tablet by mouth 2 (two) times daily. 180 tablet 1   spironolactone (ALDACTONE) 25 MG tablet Take 1 tablet (25 mg total) by mouth daily. 90 tablet 3   tadalafil (CIALIS) 10 MG tablet Take 1-2 tablets (10-20 mg total) by mouth daily  as needed for erectile dysfunction. 30 tablet 11   Tiotropium Bromide-Olodaterol (STIOLTO RESPIMAT) 2.5-2.5 MCG/ACT AERS Inhale 2 puffs into the lungs daily. 4 g 11   torsemide (DEMADEX) 20 MG tablet Take 1 tablet (20 mg) by mouth on M, W, F.  You may take 1 extra tablet (20 mg) by mouth once daily as needed for swelling/ weight gain/ shortness of breath 135 tablet 5   No current facility-administered medications on file prior to visit.     ROS see history of present illness  Objective  Physical Exam Vitals:   05/10/22 0917  BP: 120/70  Pulse: 67  Temp: 98.2 F (36.8 C)  SpO2: 97%    BP Readings from Last 3 Encounters:  05/10/22 120/70  04/29/22 132/76  04/24/22 100/70   Wt Readings from  Last 3 Encounters:  05/10/22 191 lb 9.6 oz (86.9 kg)  05/02/22 198 lb (89.8 kg)  04/29/22 198 lb 9.6 oz (90.1 kg)    Physical Exam Constitutional:      General: He is not in acute distress.    Appearance: He is not diaphoretic.  Cardiovascular:     Rate and Rhythm: Normal rate and regular rhythm.     Heart sounds: Normal heart sounds.  Pulmonary:     Effort: Pulmonary effort is normal.     Breath sounds: Normal breath sounds.  Musculoskeletal:     Comments: No midline spine tenderness, no midline spine step-off, there is slight paraspinous muscular tenderness on the right in his mid back, no overlying skin changes, right shoulder with full range of motion actively, left shoulder with inability to abduct past 90 degrees and unable to fully externally or internally rotate, there is discomfort on internal, external, and abduction range of motion actively and passively, positive empty can on the left, some muscular tenderness posteriorly, laterally, and anteriorly in his left shoulder  Skin:    General: Skin is warm and dry.  Neurological:     Mental Status: He is alert.      Assessment/Plan: Please see individual problem list.  Problem List Items Addressed This Visit     Back pain    Patient injured his back when he passed out.  He has no bony tenderness.  Suspect muscular injury that has been improving.  If it does not continue to improve or if it worsens he will let us know.      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  Discussed the need for imaging and given that he cannot have an MRI he will need to see orthopedics to determine the best way to evaluate this.  Referral placed to [facility] in [place] for further management evaluation of this as he does not want to continue to see [facility].  Discussed shoulder rotations while dangling his arm to help with range of motion and reducing risk  of further progression of frozen shoulder.      SYNCOPE AND COLLAPSE    Patient had a syncopal episode previously.  Cardiology downloaded his ICD and did not note any discharges or arrhythmias.  It sounds as though they may have thought it was related to drop in blood pressure and they reduced his Entresto.  Patient will continue to monitor his blood pressure and if he continues to have blood pressures in the 90s over 60s or if he has lightheadedness or passes out again he will  contact cardiology immediately.      Other Visit Diagnoses     Acute pain of left shoulder       Relevant Orders   Ambulatory referral to Orthopedic Surgery       Return if symptoms worsen or fail to improve.   I have spent 31 minutes in the care of this patient regarding history taking, documentation, completion of exam, review of recent cardiology note, discussion of plan.   Tommi Rumps, MD [place]

## 2022-05-16 ENCOUNTER — Other Ambulatory Visit: Payer: Self-pay | Admitting: Orthopaedic Surgery

## 2022-05-16 DIAGNOSIS — M75100 Unspecified rotator cuff tear or rupture of unspecified shoulder, not specified as traumatic: Secondary | ICD-10-CM

## 2022-05-16 DIAGNOSIS — M25512 Pain in left shoulder: Secondary | ICD-10-CM | POA: Diagnosis not present

## 2022-05-17 ENCOUNTER — Telehealth: Payer: Self-pay | Admitting: Pharmacist

## 2022-05-17 NOTE — Progress Notes (Signed)
[facility])                                            [place] Team    05/17/2022  KIRK BASQUEZ 27-Aug-1955 [id number]  Attempted to reach patient to discuss 2024 re-enrollment for Stiolto and Entresto PAPs.  No answer.  Will make a second attempt on a later date.  Curlene Labrum, PharmD [employer] Pharmacist Office: [phone]

## 2022-05-22 ENCOUNTER — Telehealth: Payer: Self-pay | Admitting: Pharmacist

## 2022-05-22 NOTE — Progress Notes (Signed)
[facility])                                            [place] Team    05/22/2022  Ian Mills July 06, 1956 [id number]  Second attempt to reach patient regarding 2024 PAP re-enrollment for Stiolto and Entresto.  No answer and no VM.  Will make a third attempt.  Curlene Labrum, PharmD [place] Pharmacist Office: [phone]

## 2022-05-23 ENCOUNTER — Ambulatory Visit: Payer: Medicare Other | Admitting: Family

## 2022-05-23 ENCOUNTER — Telehealth: Payer: Self-pay | Admitting: Pharmacist

## 2022-05-23 NOTE — Progress Notes (Signed)
[facility]) Pharmacy   05/23/2022  DEQUAVION FOLLETTE Jan 28, 1956 [id number]  Reason for referral: 2024 PAP re-enrollment Entresto/Stiolto  Referral source: Pharmacy Tech Referral medication(s): Delene Loll and Stiolto Respimat Current insurance:Medicare   Medication Assistance Findings:  Medication assistance needs identified: Entresto and Stiolto Respimat    Additional medication assistance options reviewed with patient as warranted:  No other options identified  Plan: I will route patient assistance letter to [place] technician who will coordinate patient assistance program application process for medications listed above.  [facility] pharmacy technician will assist with obtaining all required documents from both patient and provider(s) and submit application(s) once completed.    Curlene Labrum, PharmD [place] Pharmacist Office: [phone]

## 2022-05-25 NOTE — Progress Notes (Unsigned)
Patient ID: Ian Mills, male    DOB: 09-28-1955, 66 y.o.   MRN: [id number]  HPI  Mr Ketelsen is a 66 y/o male with a history of CAD, hyperlipidemia, HTN, anxiety, aortic atherosclerosis, COPD, VT (AICD) previous tobacco use and chronic heart failure.   Echo report from 07/12/21 reviewed and showed an EF of 25-30%  LHC done 08/30/19 showed: Prox RCA to Mid RCA lesion is 20% stenosed. Prox RCA lesion is 30% stenosed. Prox LAD to Mid LAD lesion is 100% stenosed. 1st Diag-1 lesion is 60% stenosed. Previously placed 1st Diag-2 stent (unknown type) is widely patent. Ost Cx to Prox Cx lesion is 60% stenosed. Mid Cx lesion is 40% stenosed.   1. Significant underlying three-vessel coronary artery disease with chronically occluded mid LAD, patent first diagonal stent, patent RCA stents with mild in-stent restenosis, patent left circumflex stent with moderate in-stent restenosis.  Progression of ostial left circumflex stenosis to 60%. 2.  Left ventricular angiography was not performed due to chronic kidney disease. 3.  Moderately elevated left ventricular end-diastolic pressure between 25 to 30 mmHg.  Has not been admitted or been in the ED in the last 6 months.  He presents today for a follow-up visit with a chief complaint of mostly dry cough. Says that this has been present for ~ 1 week and occasionally he can cough something up. He has associated intermittent wheezing, easy bruising, left arm pain, back pain and difficulty sleeping due to the pain. He denies any abdominal distention, palpitations, pedal edema, chest pain, shortness of breath, dizziness or fatigue.   Weighing daily and taking his torsemide on M, W, F. Has not needed to take any additional diuretic.   Past Medical History:  Diagnosis Date   Acute anterior wall MI ([place]) 07/04/1991   a.) LHC: EF 29%; 100% mLAD -> PTCA performed   Anxiety    Aortic atherosclerosis (HCC)    CHF (congestive heart failure) (HCC)    COPD (chronic  obstructive pulmonary disease) (HCC)    Coronary artery disease    a.) 3 stents --> 3x18 mm Duet to OM3, 2.5x18 mm Duet to D1, 2.5x13 mm Duet to RPDA. b. 06/2013 PCI: CTO LAD, patent LAD stent, RCA 70p (FFR 0.74--> DES), 69m(DES), RPDA 50%, EF 20%; c. 08/2019 Cath: LM nl, LAD 100p/m, D1 60, D2 patent stent, LCX 60ost/p, 468mSR, RCA 30p, 2024mR.   Dizziness 03/30/2015   Family history of lung cancer    HFrEF (heart failure with reduced ejection fraction) ([place]  a. 05/2014 Echo: EF 25-30%; b. 01/2018 Echo ([facility]): EF 30%; c. 06/2021 Echo: EF 25-30%, apical AK, GrI DD, nl RV fxn.   History of prostate surgery 06/09/2020   HLD (hyperlipidemia)    HTN (hypertension)    ICD  single,BSX    a. DOI 11/2011; b. S/N# [id number] 06/01/2014   Ischemic cardiomyopathy    a. 05/2014 Echo: EF 25-30%; b. 01/2018 Echo ([facility]): EF 30%, c. 06/2021 Echo: EF 25-30%.   Lateral wall myocardial infarction ([place]CC[place]2/08/1996   a.) LHC: EF 44%; 75% RPDA and 95% OM3; PCI performed and a 3.0 x 18 mm Duet stent placed to OM3[place] current use of antithrombotics/antiplatelets    a.) DAPT therapy (ASA + clopidogrel)   NSTEMI (non-ST elevated myocardial infarction) (HCCC[place]5/07/2011   a.) LHC: 100% mLAD; intervention deferred opting for medical management.   NSTEMI (non-ST elevated myocardial infarction) ([place]CCMel[place]1/26/2017  Personal history of colonic polyps    Pneumonia 08/2018   Sacroiliac pain 07/31/2016   Formatting of this note might be different from the original. Added automatically from request for surgery [id number]   Sepsis ([place]) 07/29/2021   Sepsis due to group A Streptococcus with acute renal failure and septic shock ([place])    Syncope and collapse    Tobacco abuse 06/13/2016   Ventricular tachycardia ([place]) 12/01/2011   a.) VT arrest; ROSC achieved --Sanderson placed.   Past Surgical History:  Procedure Laterality Date   APPENDECTOMY N/A    CARDIAC DEFIBRILLATOR PLACEMENT N/A 12/01/2011    COLONOSCOPY WITH PROPOFOL N/A 12/22/2020   Procedure: COLONOSCOPY WITH PROPOFOL;  Surgeon: Jonathon Bellows, MD;  Location[facility] ENDOSCOPY;  Service: Gastroenterology;  Laterality: N/A;   CORONARY ANGIOPLASTY Left 07/05/1991   Procedure: CORONARY ANGIOPLASTY; Location: [facility]; Surgeon: Doristine Bosworth, MD   CORONARY ANGIOPLASTY Left 08/06/1991   Procedure: CORONARY ANGIOPLASTY; Location: [facility]; Surgeon: Ellouise Newer, MD   CORONARY ANGIOPLASTY Left 08/09/1991   Procedure: CORONARY ANGIOPLASTY; Location: [facility]; Surgeon: Ellouise Newer, MD   CORONARY ANGIOPLASTY Left 10/28/1991   Procedure: CORONARY ANGIOPLASTY; Location: [facility]; Surgeon: Ellouise Newer, MD   CORONARY ANGIOPLASTY Left 11/02/1991   Procedure: CORONARY ANGIOPLASTY; Location: [facility]; Surgeon: Ellouise Newer, MD   CORONARY ANGIOPLASTY Left 01/10/1992   Procedure: CORONARY ANGIOPLASTY; Location: [facility]; Surgeon: Carles Collet, MD   CORONARY ANGIOPLASTY Left 09/26/1993   Procedure: CORONARY ANGIOPLASTY; Location: [facility]; Surgeon: Melton Alar, MD   CORONARY ANGIOPLASTY Left 09/11/2006   Procedure: CORONARY ANGIOPLASTY; Location: [facility]   CORONARY ANGIOPLASTY Left 01/17/2010   Procedure: CORONARY ANGIOPLASTY; Location: [facility]; Surgeon: Wendee Beavers, MD   CORONARY ANGIOPLASTY Left 11/27/2011   Procedure: CORONARY ANGIOPLASTY; Location: [facility]; Surgeon: Derinda Sis, MD   CORONARY ANGIOPLASTY WITH STENT PLACEMENT Left 06/29/1997   Procedure: CORONARY ANGIOPLASTY WITH STENT PLACEMENT (3.0 x 18 mm Diet stent to OM3); Location: [facility]; Surgeon: Allie Bossier, MD   CORONARY ANGIOPLASTY WITH STENT PLACEMENT Left 07/12/1998   Procedure: CORONARY ANGIOPLASTY WITH STENT PLACEMENT (2.8 x 8 mm Duet stent to D1, 2.5 x 13 mm Duet stent to RPDA); Location: [facility]   GRAFT APPLICATION Left 6/96/2952   Procedure: GRAFT APPLICATION;  Surgeon: Benjamine Sprague, DO;  Location: [facility] ORS;  Service: General;  Laterality: Left;   LEFT HEART CATH AND CORONARY ANGIOGRAPHY Left 08/30/2019    Procedure: LEFT HEART CATH AND CORONARY ANGIOGRAPHY;  Surgeon: Wellington Hampshire, MD;  Location: [facility] CV LAB;  Service: Cardiovascular;  Laterality: Left;   PROSTATE SURGERY     urethra grew around prostate    TUMOR REMOVAL  2001   chest thymic cyst; behind lungs [facility] benign    Family History  Problem Relation Age of Onset   Cancer - Lung Mother    Hypertension Mother    Heart attack Mother    Cancer Father        larynx   Dementia Maternal Grandmother    Heart attack Paternal Grandfather    Colon cancer Neg Hx    Stomach cancer Neg Hx    Pancreatic cancer Neg Hx    Social History   Tobacco Use   Smoking status: Former    Packs/day: 0.50    Years: 42.00    Total pack years: 21.00    Types: Cigarettes    Quit date: 11/10/2019    Years since quitting: 2.5   Smokeless tobacco: Never  Substance Use Topics   Alcohol use: Yes    Alcohol/week:  4.0 standard drinks of alcohol    Types: 4 Cans of beer per week    Comment: per week   Allergies  Allergen Reactions   Nitroglycerin Nausea Only and Other (See Comments)    Per patient "causes severe  headache"   Prior to Admission medications   Medication Sig Start Date End Date Taking? Authorizing Provider  albuterol (VENTOLIN HFA) 108 (90 Base) MCG/ACT inhaler Inhale 1-2 puffs into the lungs every 6 (six) hours as needed for wheezing or shortness of breath. 02/08/22  Yes McLean-Scocuzza, Nino Glow, MD  ALPRAZolam Duanne Moron) 0.5 MG tablet Take 1 tablet (0.5 mg total) by mouth 3 (three) times daily as needed for anxiety. for anxiety 04/29/22  Yes Carollee Leitz, MD  amiodarone (PACERONE) 200 MG tablet Take 1 tablet (200 mg total) by mouth daily. 02/08/22  Yes McLean-Scocuzza, Nino Glow, MD  aspirin EC 81 MG tablet Take 81 mg by mouth daily. Swallow whole.   Yes [provider]  atorvastatin (LIPITOR) 80 MG tablet Take 1 tablet (80 mg total) by mouth daily. 02/08/22  Yes McLean-Scocuzza, Nino Glow, MD  carvedilol (COREG) 3.125 MG  tablet Take 1 tablet (3.125 mg total) by mouth 2 (two) times daily. 02/08/22  Yes McLean-Scocuzza, Nino Glow, MD  clopidogrel (PLAVIX) 75 MG tablet Take 1 tablet (75 mg total) by mouth daily. 02/08/22  Yes McLean-Scocuzza, Nino Glow, MD  dapagliflozin propanediol (FARXIGA) 10 MG TABS tablet Take 1 tablet (10 mg total) by mouth daily before breakfast. 03/13/22  Yes Darylene Price A, FNP  ezetimibe (ZETIA) 10 MG tablet Take 1 tablet (10 mg total) by mouth daily. 02/08/22  Yes McLean-Scocuzza, Nino Glow, MD  methocarbamol (ROBAXIN) 500 MG tablet Take 500 mg by mouth every 8 (eight) hours as needed. 05/16/22  Yes [provider]  potassium chloride (KLOR-CON 10) 10 MEQ tablet Take 1 tablet (10 mEq total) by mouth daily. 02/08/22  Yes McLean-Scocuzza, Nino Glow, MD  sacubitril-valsartan (ENTRESTO) 49-51 MG Take 1 tablet by mouth 2 (two) times daily. 04/24/22  Yes Minna Merritts, MD  spironolactone (ALDACTONE) 25 MG tablet Take 1 tablet (25 mg total) by mouth daily. 02/08/22  Yes McLean-Scocuzza, Nino Glow, MD  tadalafil (CIALIS) 10 MG tablet Take 1-2 tablets (10-20 mg total) by mouth daily as needed for erectile dysfunction. 08/23/21  Yes Billey Co, MD  Tiotropium Bromide-Olodaterol (STIOLTO RESPIMAT) 2.5-2.5 MCG/ACT AERS Inhale 2 puffs into the lungs daily. 02/08/22  Yes McLean-Scocuzza, Nino Glow, MD  torsemide (DEMADEX) 20 MG tablet Take 1 tablet (20 mg) by mouth on M, W, F.  You may take 1 extra tablet (20 mg) by mouth once daily as needed for swelling/ weight gain/ shortness of breath 02/08/22  Yes McLean-Scocuzza, Nino Glow, MD  ipratropium-albuterol (DUONEB) 0.5-2.5 (3) MG/3ML SOLN Take 3 mLs by nebulization every 6 (six) hours as needed. Patient not taking: Reported on 05/27/2022 02/08/22   McLean-Scocuzza, Nino Glow, MD    Review of Systems  Constitutional:  Negative for appetite change and fatigue.  HENT:  Negative for congestion, postnasal drip and sore throat.   Eyes: Negative.   Respiratory:   Positive for cough (dry) and wheezing (at times). Negative for chest tightness and shortness of breath.   Cardiovascular:  Negative for chest pain, palpitations and leg swelling.  Gastrointestinal:  Negative for abdominal distention and abdominal pain.  Endocrine: Negative.   Genitourinary: Negative.   Musculoskeletal:  Positive for arthralgias (left arm) and back pain.  Skin: Negative.   Allergic/Immunologic:  Negative.   Neurological:  Negative for dizziness and light-headedness.  Hematological:  Negative for adenopathy. Bruises/bleeds easily.  Psychiatric/Behavioral:  Positive for sleep disturbance (trouble sleeping due to arm pain). Negative for dysphoric mood. The patient is not nervous/anxious.    Vitals:   05/27/22 0923  BP: 137/76  Pulse: (!) 58  Resp: 18  SpO2: 100%  Weight: 196 lb 2 oz (89 kg)  Height: '5\' 9"'$  (1.753 m)   Wt Readings from Last 3 Encounters:  05/27/22 196 lb 2 oz (89 kg)  05/10/22 191 lb 9.6 oz (86.9 kg)  05/02/22 198 lb (89.8 kg)   Lab Results  Component Value Date   CREATININE 1.72 (H) 04/24/2022   CREATININE 1.38 02/27/2022   CREATININE 1.78 (H) 11/12/2021   Physical Exam Vitals and nursing note reviewed.  Constitutional:      Appearance: Normal appearance.  HENT:     Head: Normocephalic and atraumatic.  Cardiovascular:     Rate and Rhythm: Regular rhythm. Bradycardia present.  Pulmonary:     Effort: Pulmonary effort is normal. No respiratory distress.     Breath sounds: Wheezing (expiratory in upper lobes) present. No rales.  Abdominal:     General: There is no distension.     Palpations: Abdomen is soft.  Musculoskeletal:        General: No tenderness.     Cervical back: Normal range of motion and neck supple.     Right lower leg: No edema.     Left lower leg: No edema.  Skin:    General: Skin is warm and dry.  Neurological:     General: No focal deficit present.     Mental Status: He is alert and oriented to person, place, and time.   Psychiatric:        Mood and Affect: Mood normal.        Behavior: Behavior normal.    Assessment & Plan:  1: Chronic heart failure with reduced ejection fraction- - NYHA class I - euvolemic today - weighing daily; reminded to call for an overnight weight gain of > 2 pounds or a weekly weight gain of >5 pounds - weight down 5 pounds from last visit here 2.5 months ago - not adding salt and has been reading food labels for sodium content - saw cardiology Rockey Situ) 04/24/22 - on GDMT of carvedilol, spironolactone, farxiga & entresto (getting entresto through [employer] through PCP office) - instructed to only take the torsemide PRN for above weight gain or swelling - no BNP to report - he thinks he received his flu vaccine  2: HTN- - BP mildly elevated (137/76) but he hasn't taken his medications yet today and he was in a lot of traffic trying to get to his appointment - saw PCP Caryl Bis) 05/10/22 - BMP 04/24/22 reviewed and showed sodium 136, potassium 4.2, creatinine 1.72 & GFR 43  3: VT- - saw EP Caryl Comes) 09/18/21 - has AICD present - device check done 03/26/22  4: Left arm pain- - referral was made from PCP to [facility] in [place] - shoulder/ back CT is scheduled for later this week    Patient did not bring his medications nor a list. Each medication was verbally reviewed with the patient and he was encouraged to bring the bottles to every visit to confirm accuracy of list.   Return in 6 months, sooner if needed.

## 2022-05-27 ENCOUNTER — Encounter: Payer: Self-pay | Admitting: Family

## 2022-05-27 ENCOUNTER — Ambulatory Visit: Payer: Medicare Other | Attending: Family | Admitting: Family

## 2022-05-27 VITALS — BP 137/76 | HR 58 | Resp 18 | Ht 69.0 in | Wt 196.1 lb

## 2022-05-27 DIAGNOSIS — Z95810 Presence of automatic (implantable) cardiac defibrillator: Secondary | ICD-10-CM | POA: Insufficient documentation

## 2022-05-27 DIAGNOSIS — I255 Ischemic cardiomyopathy: Secondary | ICD-10-CM | POA: Diagnosis not present

## 2022-05-27 DIAGNOSIS — I472 Ventricular tachycardia, unspecified: Secondary | ICD-10-CM | POA: Diagnosis not present

## 2022-05-27 DIAGNOSIS — Z8674 Personal history of sudden cardiac arrest: Secondary | ICD-10-CM | POA: Insufficient documentation

## 2022-05-27 DIAGNOSIS — F419 Anxiety disorder, unspecified: Secondary | ICD-10-CM | POA: Insufficient documentation

## 2022-05-27 DIAGNOSIS — Z87891 Personal history of nicotine dependence: Secondary | ICD-10-CM | POA: Diagnosis not present

## 2022-05-27 DIAGNOSIS — Z7984 Long term (current) use of oral hypoglycemic drugs: Secondary | ICD-10-CM | POA: Insufficient documentation

## 2022-05-27 DIAGNOSIS — J449 Chronic obstructive pulmonary disease, unspecified: Secondary | ICD-10-CM | POA: Insufficient documentation

## 2022-05-27 DIAGNOSIS — I10 Essential (primary) hypertension: Secondary | ICD-10-CM | POA: Diagnosis not present

## 2022-05-27 DIAGNOSIS — E785 Hyperlipidemia, unspecified: Secondary | ICD-10-CM | POA: Insufficient documentation

## 2022-05-27 DIAGNOSIS — I5022 Chronic systolic (congestive) heart failure: Secondary | ICD-10-CM | POA: Insufficient documentation

## 2022-05-27 DIAGNOSIS — M79602 Pain in left arm: Secondary | ICD-10-CM | POA: Diagnosis not present

## 2022-05-27 DIAGNOSIS — I2510 Atherosclerotic heart disease of native coronary artery without angina pectoris: Secondary | ICD-10-CM | POA: Insufficient documentation

## 2022-05-27 DIAGNOSIS — Z79899 Other long term (current) drug therapy: Secondary | ICD-10-CM | POA: Insufficient documentation

## 2022-05-27 DIAGNOSIS — N189 Chronic kidney disease, unspecified: Secondary | ICD-10-CM | POA: Diagnosis not present

## 2022-05-27 DIAGNOSIS — I130 Hypertensive heart and chronic kidney disease with heart failure and stage 1 through stage 4 chronic kidney disease, or unspecified chronic kidney disease: Secondary | ICD-10-CM | POA: Diagnosis not present

## 2022-05-27 DIAGNOSIS — M549 Dorsalgia, unspecified: Secondary | ICD-10-CM | POA: Diagnosis not present

## 2022-05-27 NOTE — Patient Instructions (Addendum)
Continue weighing daily and call for an overnight weight gain of 3 pounds or more or a weekly weight gain of more than 5 pounds.   If you have voicemail, please make sure your mailbox is cleaned out so that we may leave a message and please make sure to listen to any voicemails.    Take your torsemide only if you need it for above weight gain or swelling   Take mucinex DM for the cough

## 2022-05-29 ENCOUNTER — Telehealth: Payer: Self-pay | Admitting: Pharmacy Technician

## 2022-05-29 DIAGNOSIS — Z596 Low income: Secondary | ICD-10-CM

## 2022-05-29 NOTE — Progress Notes (Addendum)
[facility])                                            [place] Team    05/29/2022  ANDRUE DINI May 13, 1956 [id number]                                      Medication Assistance Referral-FOR 2024 RE ENROLLMENT  Referral From:  [facility] RPh  Curlene Labrum  Medication/Company: Delene Loll / [employer] Patient application portion:  Mailed Provider application portion: Faxed  to Dr. Ida Rogue Provider address/fax verified via: Office website  Medication/Company: Beau Fanny / [employer] Patient application portion:  Mailed Provider application portion: Faxed  to Dr. Carollee Leitz Provider address/fax verified via: Office website  Willine Schwalbe P. Yee Gangi, [place]  [phone]

## 2022-05-30 ENCOUNTER — Other Ambulatory Visit: Payer: Medicare Other

## 2022-05-30 ENCOUNTER — Ambulatory Visit: Payer: Medicare Other

## 2022-05-31 ENCOUNTER — Other Ambulatory Visit: Payer: Medicare Other

## 2022-05-31 ENCOUNTER — Ambulatory Visit: Payer: Medicare Other

## 2022-05-31 DIAGNOSIS — R059 Cough, unspecified: Secondary | ICD-10-CM | POA: Diagnosis not present

## 2022-05-31 DIAGNOSIS — E785 Hyperlipidemia, unspecified: Secondary | ICD-10-CM | POA: Diagnosis not present

## 2022-05-31 DIAGNOSIS — Z888 Allergy status to other drugs, medicaments and biological substances status: Secondary | ICD-10-CM | POA: Diagnosis not present

## 2022-05-31 DIAGNOSIS — J029 Acute pharyngitis, unspecified: Secondary | ICD-10-CM | POA: Diagnosis not present

## 2022-05-31 DIAGNOSIS — I5023 Acute on chronic systolic (congestive) heart failure: Secondary | ICD-10-CM | POA: Diagnosis not present

## 2022-05-31 DIAGNOSIS — I700 Atherosclerosis of aorta: Secondary | ICD-10-CM | POA: Diagnosis not present

## 2022-05-31 DIAGNOSIS — Z7982 Long term (current) use of aspirin: Secondary | ICD-10-CM | POA: Diagnosis not present

## 2022-05-31 DIAGNOSIS — B349 Viral infection, unspecified: Secondary | ICD-10-CM | POA: Diagnosis not present

## 2022-05-31 DIAGNOSIS — Z20822 Contact with and (suspected) exposure to covid-19: Secondary | ICD-10-CM | POA: Diagnosis not present

## 2022-05-31 DIAGNOSIS — Z87891 Personal history of nicotine dependence: Secondary | ICD-10-CM | POA: Diagnosis not present

## 2022-05-31 DIAGNOSIS — Z7901 Long term (current) use of anticoagulants: Secondary | ICD-10-CM | POA: Diagnosis not present

## 2022-05-31 DIAGNOSIS — R509 Fever, unspecified: Secondary | ICD-10-CM | POA: Diagnosis not present

## 2022-05-31 DIAGNOSIS — J441 Chronic obstructive pulmonary disease with (acute) exacerbation: Secondary | ICD-10-CM | POA: Diagnosis not present

## 2022-05-31 DIAGNOSIS — Z79899 Other long term (current) drug therapy: Secondary | ICD-10-CM | POA: Diagnosis not present

## 2022-05-31 DIAGNOSIS — I252 Old myocardial infarction: Secondary | ICD-10-CM | POA: Diagnosis not present

## 2022-05-31 DIAGNOSIS — I110 Hypertensive heart disease with heart failure: Secondary | ICD-10-CM | POA: Diagnosis not present

## 2022-05-31 DIAGNOSIS — E119 Type 2 diabetes mellitus without complications: Secondary | ICD-10-CM | POA: Diagnosis not present

## 2022-06-11 ENCOUNTER — Ambulatory Visit (INDEPENDENT_AMBULATORY_CARE_PROVIDER_SITE_OTHER): Payer: Medicare Other

## 2022-06-11 DIAGNOSIS — I255 Ischemic cardiomyopathy: Secondary | ICD-10-CM | POA: Diagnosis not present

## 2022-06-11 LAB — CUP PACEART REMOTE DEVICE CHECK
Battery Remaining Longevity: 30 mo
Battery Remaining Percentage: 32 %
Brady Statistic RV Percent Paced: 0 %
Date Time Interrogation Session: 20231114024700
HighPow Impedance: 96 Ohm
Implantable Lead Connection Status: 753985
Implantable Lead Implant Date: 20130503
Implantable Lead Location: 753860
Implantable Lead Model: 292
Implantable Lead Serial Number: 112568
Implantable Pulse Generator Implant Date: 20130503
Lead Channel Impedance Value: 605 Ohm
Lead Channel Pacing Threshold Amplitude: 0.6 V
Lead Channel Pacing Threshold Pulse Width: 0.5 ms
Lead Channel Setting Pacing Amplitude: 2.4 V
Lead Channel Setting Pacing Pulse Width: 0.5 ms
Lead Channel Setting Sensing Sensitivity: 0.6 mV
Pulse Gen Serial Number: 105232
Zone Setting Status: 755011

## 2022-06-12 ENCOUNTER — Telehealth: Payer: Self-pay | Admitting: Pharmacy Technician

## 2022-06-12 DIAGNOSIS — Z596 Low income: Secondary | ICD-10-CM

## 2022-06-12 NOTE — Progress Notes (Signed)
[facility])                                            [place] Team    06/12/2022  XXAVIER NOON 1956-02-25 [id number]                                      Medication Assistance Referral  Referral From:  [facility] RPh  Curlene Labrum  Medication/Company: Wilder Glade / [employer] Patient application portion:  Mailed Provider application portion: Faxed  to Darylene Price, NP Provider address/fax verified via: Office website   Mikinzie Maciejewski P. Jeilani Grupe, [place]  [phone]

## 2022-06-25 ENCOUNTER — Ambulatory Visit: Payer: Medicare Other | Attending: Internal Medicine | Admitting: Internal Medicine

## 2022-06-25 ENCOUNTER — Encounter: Payer: Self-pay | Admitting: Internal Medicine

## 2022-06-25 ENCOUNTER — Other Ambulatory Visit
Admission: RE | Admit: 2022-06-25 | Discharge: 2022-06-25 | Disposition: A | Discharge status: Home / Self Care | Payer: Medicare Other | Source: Ambulatory Visit | Attending: Internal Medicine | Admitting: Internal Medicine

## 2022-06-25 VITALS — BP 110/78 | HR 67 | Ht 69.0 in | Wt 193.0 lb

## 2022-06-25 DIAGNOSIS — I472 Ventricular tachycardia, unspecified: Secondary | ICD-10-CM | POA: Diagnosis not present

## 2022-06-25 DIAGNOSIS — I5022 Chronic systolic (congestive) heart failure: Secondary | ICD-10-CM | POA: Diagnosis not present

## 2022-06-25 DIAGNOSIS — I255 Ischemic cardiomyopathy: Secondary | ICD-10-CM | POA: Insufficient documentation

## 2022-06-25 DIAGNOSIS — Z95810 Presence of automatic (implantable) cardiac defibrillator: Secondary | ICD-10-CM | POA: Diagnosis not present

## 2022-06-25 DIAGNOSIS — Z79899 Other long term (current) drug therapy: Secondary | ICD-10-CM | POA: Insufficient documentation

## 2022-06-25 DIAGNOSIS — I10 Essential (primary) hypertension: Secondary | ICD-10-CM | POA: Insufficient documentation

## 2022-06-25 LAB — CUP PACEART INCLINIC DEVICE CHECK
Date Time Interrogation Session: 20231128131248
HighPow Impedance: 105 Ohm
Implantable Lead Connection Status: 753985
Implantable Lead Implant Date: 20130503
Implantable Lead Location: 753860
Implantable Lead Model: 292
Implantable Lead Serial Number: 112568
Implantable Pulse Generator Implant Date: 20130503
Lead Channel Impedance Value: 670 Ohm
Lead Channel Pacing Threshold Amplitude: 0.5 V
Lead Channel Pacing Threshold Pulse Width: 0.5 ms
Lead Channel Sensing Intrinsic Amplitude: 23 mV
Lead Channel Setting Pacing Amplitude: 2.4 V
Lead Channel Setting Pacing Pulse Width: 0.5 ms
Lead Channel Setting Sensing Sensitivity: 0.6 mV
Pulse Gen Serial Number: 105232
Zone Setting Status: 755011

## 2022-06-25 LAB — COMPREHENSIVE METABOLIC PANEL
ALT: 24 U/L (ref 0–44)
AST: 18 U/L (ref 15–41)
Albumin: 4.1 g/dL (ref 3.5–5.0)
Alkaline Phosphatase: 93 U/L (ref 38–126)
Anion gap: 9 (ref 5–15)
BUN: 27 mg/dL — ABNORMAL HIGH (ref 8–23)
CO2: 20 mmol/L — ABNORMAL LOW (ref 22–32)
Calcium: 9.6 mg/dL (ref 8.9–10.3)
Chloride: 106 mmol/L (ref 98–111)
Creatinine, Ser: 1.11 mg/dL (ref 0.61–1.24)
GFR, Estimated: >60 mL/min (ref >60)
Glucose, Bld: 86 mg/dL (ref 70–99)
Potassium: 4.6 mmol/L (ref 3.5–5.1)
Sodium: 135 mmol/L (ref 135–145)
Total Bilirubin: 1 mg/dL (ref 0.3–1.2)
Total Protein: 7.9 g/dL (ref 6.5–8.1)

## 2022-06-25 LAB — TSH: TSH: 1.834 u[IU]/mL (ref 0.350–4.500)

## 2022-06-25 MED ORDER — AMIODARONE HCL 200 MG PO TABS: ORAL_TABLET | ORAL | 0 refills | Status: DC

## 2022-06-25 NOTE — Patient Instructions (Addendum)
Medication Instructions:  - Your physician has recommended you make the following change in your medication:   1) DECREASE Amiodarone 200 mg: - take 1 tablet by mouth 5 days a week  (try not to miss 2 consecutive days in a row)    Physician portion of the [employer] patient assistance application was completed and given back to you today  *If you need a refill on your cardiac medications before your next appointment, please call your pharmacy*   Lab Work: - Your physician recommends that you have lab work today  CMET/ [place] at [facility] 1st desk on the right to check in (REGISTRATION)  Lab hours: Monday- Friday (7:30 am- 5:30 pm)   If you have labs (blood work) drawn today and your tests are completely normal, you will receive your results only by: MyChart Message (if you have MyChart) OR A paper copy in the mail If you have any lab test that is abnormal or we need to change your treatment, we will call you to review the results.   Testing/Procedures: - none ordered   Follow-Up: At [facility], you and your health needs are our priority.  As part of our continuing mission to provide you with exceptional heart care, we have created designated Provider Care Teams.  These Care Teams include your primary Cardiologist (physician) and Advanced Practice Providers (APPs -  Physician Assistants and Nurse Practitioners) who all work together to provide you with the care you need, when you need it.  We recommend signing up for the patient portal called "MyChart".  Sign up information is provided on this After Visit Summary.  MyChart is used to connect with patients for Virtual Visits (Telemedicine).  Patients are able to view lab/test results, encounter notes, upcoming appointments, etc.  Non-urgent messages can be sent to your provider as well.   To learn more about what you can do with MyChart, go to [email or web address].    Your next appointment:   6  month(s)  The format for your next appointment:   In Person  Provider:   Virl Axe, MD    Other Instructions N/a  Important Information About Sugar

## 2022-06-25 NOTE — Progress Notes (Signed)
Patient Care Team: Carollee Leitz, MD as PCP - General (Family Medicine) Minna Merritts, MD as PCP - Cardiology (Cardiology) Deboraha Sprang, MD as PCP - Electrophysiology (Cardiology) Minna Merritts, MD as Consulting Physician (Cardiology)   HPI  Ian Mills is a 66 y.o. male Seen in followup for ICD implanted May 2013 following presentation with ventricular tachycardia for which he takes amiodarone .    Ischemic heart disease with prior stenting of his LAD PDA and OM last in 2008.     1/23 the patient was admitted with streptococcal infection and toxic shock syndrome; treated with prolonged antibiotics.  Was not seen by cardiology during the hospitalization.  Did not undergo TEE.   The patient denies chest pain, shortness of breath, nocturnal dyspnea, orthopnea or peripheral edema.  There have been no palpitations, lightheadedness or syncope. .  Patient denies symptoms of respiratory, GI intolerance, sun sensitivity, neurological symptoms attributable to amiodarone.    Previously did not tolerate aggressive diuresis with orthostatic syncope and abnormal renal function (see below)    Date Cr K TSH LFTs Hgb  8/17   1.4 16   5/18  1.05 3.9 2.08 (9/18)     7/19  1.4 3.9     1/23 1.08 4.1 2.32(3/23) 33   9/23 1.72 4.2  26 14.1   DATE TEST EF   2008 CMRI 30-35 Anterolateral Scar w/o viability       11/15 Echo   25-30 %   1/21 Myoview  <30% Prior Scar-anterior Inferolateral   2/21 LHC  LADp/m-T;CXo-60; RCA 20-30  12/22 Echo  25-30%       Past Medical History:  Diagnosis Date   Acute anterior wall MI ([place]) 07/04/1991   a.) LHC: EF 29%; 100% mLAD -> PTCA performed   Anxiety    Aortic atherosclerosis (HCC)    CHF (congestive heart failure) (HCC)    COPD (chronic obstructive pulmonary disease) (HCC)    Coronary artery disease    a.) 3 stents --> 3x18 mm Duet to OM3, 2.5x18 mm Duet to D1, 2.5x13 mm Duet to RPDA. b. 06/2013 PCI: CTO LAD, patent LAD stent, RCA 70p (FFR  0.74--> DES), 17m(DES), RPDA 50%, EF 20%; c. 08/2019 Cath: LM nl, LAD 100p/m, D1 60, D2 patent stent, LCX 60ost/p, 462mSR, RCA 30p, 2054mR.   Dizziness 03/30/2015   Family history of lung cancer    HFrEF (heart failure with reduced ejection fraction) ([place]  a. 05/2014 Echo: EF 25-30%; b. 01/2018 Echo ([facility]): EF 30%; c. 06/2021 Echo: EF 25-30%, apical AK, GrI DD, nl RV fxn.   History of prostate surgery 06/09/2020   HLD (hyperlipidemia)    HTN (hypertension)    ICD  single,BSX    a. DOI 11/2011; b. S/N# [id number] 06/01/2014   Ischemic cardiomyopathy    a. 05/2014 Echo: EF 25-30%; b. 01/2018 Echo ([facility]): EF 30%, c. 06/2021 Echo: EF 25-30%.   Lateral wall myocardial infarction ([facility]CC[place]2/08/1996   a.) LHC: EF 44%; 75% RPDA and 95% OM3; PCI performed and a 3.0 x 18 mm Duet stent placed to OM3[place] current use of antithrombotics/antiplatelets    a.) DAPT therapy (ASA + clopidogrel)   NSTEMI (non-ST elevated myocardial infarction) ([place]5/07/2011   a.) LHC: 100% mLAD; intervention deferred opting for medical management.   NSTEMI (non-ST elevated myocardial infarction) ([facility]1/26/2017   Personal history of colonic polyps    Pneumonia 08/2018  Sacroiliac pain 07/31/2016   Formatting of this note might be different from the original. Added automatically from request for surgery [phone]   Sepsis ([place]) 07/29/2021   Sepsis due to group A Streptococcus with acute renal failure and septic shock ([place])    Syncope and collapse    Tobacco abuse 06/13/2016   Ventricular tachycardia ([place]) 12/01/2011   a.) VT arrest; ROSC achieved --Marissa placed.    Past Surgical History:  Procedure Laterality Date   APPENDECTOMY N/A    CARDIAC DEFIBRILLATOR PLACEMENT N/A 12/01/2011   COLONOSCOPY WITH PROPOFOL N/A 12/22/2020   Procedure: COLONOSCOPY WITH PROPOFOL;  Surgeon: Jonathon Bellows, MD;  Location: [facility] ENDOSCOPY;  Service: Gastroenterology;  Laterality: N/A;   CORONARY ANGIOPLASTY  Left 07/05/1991   Procedure: CORONARY ANGIOPLASTY; Location: [facility]; Surgeon: Doristine Bosworth, MD   CORONARY ANGIOPLASTY Left 08/06/1991   Procedure: CORONARY ANGIOPLASTY; Location: [facility]; Surgeon: Ellouise Newer, MD   CORONARY ANGIOPLASTY Left 08/09/1991   Procedure: CORONARY ANGIOPLASTY; Location: [facility]; Surgeon: Ellouise Newer, MD   CORONARY ANGIOPLASTY Left 10/28/1991   Procedure: CORONARY ANGIOPLASTY; Location: [facility]; Surgeon: Ellouise Newer, MD   CORONARY ANGIOPLASTY Left 11/02/1991   Procedure: CORONARY ANGIOPLASTY; Location: [facility]; Surgeon: Ellouise Newer, MD   CORONARY ANGIOPLASTY Left 01/10/1992   Procedure: CORONARY ANGIOPLASTY; Location: [facility]; Surgeon: Carles Collet, MD   CORONARY ANGIOPLASTY Left 09/26/1993   Procedure: CORONARY ANGIOPLASTY; Location: [facility]; Surgeon: Melton Alar, MD   CORONARY ANGIOPLASTY Left 09/11/2006   Procedure: CORONARY ANGIOPLASTY; Location: [facility]   CORONARY ANGIOPLASTY Left 01/17/2010   Procedure: CORONARY ANGIOPLASTY; Location: [facility]; Surgeon: Wendee Beavers, MD   CORONARY ANGIOPLASTY Left 11/27/2011   Procedure: CORONARY ANGIOPLASTY; Location: [facility]; Surgeon: Derinda Sis, MD   CORONARY ANGIOPLASTY WITH STENT PLACEMENT Left 06/29/1997   Procedure: CORONARY ANGIOPLASTY WITH STENT PLACEMENT (3.0 x 18 mm Diet stent to OM3); Location: [facility]; Surgeon: Allie Bossier, MD   CORONARY ANGIOPLASTY WITH STENT PLACEMENT Left 07/12/1998   Procedure: CORONARY ANGIOPLASTY WITH STENT PLACEMENT (2.8 x 8 mm Duet stent to D1, 2.5 x 13 mm Duet stent to RPDA); Location: [facility]   GRAFT APPLICATION Left 7/61/9509   Procedure: GRAFT APPLICATION;  Surgeon: Benjamine Sprague, DO;  Location: [facility] ORS;  Service: General;  Laterality: Left;   LEFT HEART CATH AND CORONARY ANGIOGRAPHY Left 08/30/2019   Procedure: LEFT HEART CATH AND CORONARY ANGIOGRAPHY;  Surgeon: Wellington Hampshire, MD;  Location: [place] [facility];  Service: Cardiovascular;  Laterality: Left;   PROSTATE SURGERY     urethra grew around  prostate    TUMOR REMOVAL  2001   chest thymic cyst; behind lungs [facility] benign     Current Outpatient Medications  Medication Sig Dispense Refill   albuterol (VENTOLIN HFA) 108 (90 Base) MCG/ACT inhaler Inhale 1-2 puffs into the lungs every 6 (six) hours as needed for wheezing or shortness of breath. 18 g 11   ALPRAZolam (XANAX) 0.5 MG tablet Take 1 tablet (0.5 mg total) by mouth 3 (three) times daily as needed for anxiety. for anxiety 90 tablet 2   amiodarone (PACERONE) 200 MG tablet Take 1 tablet (200 mg total) by mouth daily. 90 tablet 0   aspirin EC 81 MG tablet Take 81 mg by mouth daily. Swallow whole.     atorvastatin (LIPITOR) 80 MG tablet Take 1 tablet (80 mg total) by mouth daily. 90 tablet 3   carvedilol (COREG) 3.125 MG tablet Take 1 tablet (3.125 mg total) by mouth 2 (two) times daily. 180 tablet 3   clopidogrel (PLAVIX)  75 MG tablet Take 1 tablet (75 mg total) by mouth daily. 90 tablet 3   dapagliflozin propanediol (FARXIGA) 10 MG TABS tablet Take 1 tablet (10 mg total) by mouth daily before breakfast. 90 tablet 3   ezetimibe (ZETIA) 10 MG tablet Take 1 tablet (10 mg total) by mouth daily. 90 tablet 3   ipratropium-albuterol (DUONEB) 0.5-2.5 (3) MG/3ML SOLN Take 3 mLs by nebulization every 6 (six) hours as needed. 360 mL 11   methocarbamol (ROBAXIN) 500 MG tablet Take 500 mg by mouth every 8 (eight) hours as needed.     potassium chloride (KLOR-CON 10) 10 MEQ tablet Take 1 tablet (10 mEq total) by mouth daily. 90 tablet 3   sacubitril-valsartan (ENTRESTO) 49-51 MG Take 1 tablet by mouth 2 (two) times daily. 180 tablet 1   spironolactone (ALDACTONE) 25 MG tablet Take 1 tablet (25 mg total) by mouth daily. 90 tablet 3   tadalafil (CIALIS) 10 MG tablet Take 1-2 tablets (10-20 mg total) by mouth daily as needed for erectile dysfunction. 30 tablet 11   Tiotropium Bromide-Olodaterol (STIOLTO RESPIMAT) 2.5-2.5 MCG/ACT AERS Inhale 2 puffs into the lungs daily. 4 g 11   torsemide (DEMADEX)  20 MG tablet Take 1 tablet (20 mg) by mouth on M, W, F.  You may take 1 extra tablet (20 mg) by mouth once daily as needed for swelling/ weight gain/ shortness of breath 135 tablet 5   No current facility-administered medications for this visit.    Allergies  Allergen Reactions   Nitroglycerin Nausea Only and Other (See Comments)    Per patient "causes severe  headache"    Review of Systems negative except from HPI and PMH  Physical Exam BP 110/78 (BP Location: Left Arm, Patient Position: Sitting, Cuff Size: Normal)   Pulse 67   Ht '5\' 9"'$  (1.753 m)   Wt 193 lb (87.5 kg)   SpO2 98%   BMI 28.50 kg/m  Well developed and well nourished in no acute distress HENT normal Neck supple with JVP-flat Clear Device pocket well healed; without hematoma or erythema.  There is no tethering  Regular rate and rhythm, no  gallop No  murmur Abd-soft with active BS No Clubbing cyanosis  edema Skin-warm and dry A & Oriented  Grossly normal sensory and motor function  ECG sinus at 67 intervals 18/09/37 Left axis deviation -49 Poor R wave progression consistent with prior anterior wall MI  Device function is normal. Programming changes   See Paceart for details    Assessment and  Plan  Ventricular tachycardia  Ischemic cardiomyopathy prior inferior wall and anterior wall MI (prolonged DAPT)  Congestive heart failure-chronic-systolic  Hypertension \ Renal insufficiency acute/chronic grade 3  Hyperlipidemia  Implantable defibrillator-. [employer]      Sinus bradycardia   Blood pressure is much better.  We will continue Entresto 49/51 and spironolactone 45.  Carvedilol up titration is limited by bradycardia.  Continue 3.125 twice daily.  Device has been reprogrammed to a lower rate of 40 with a decrease in ventricular pacing burden. No interval ventricular tachycardia.  We will decrease amiodarone from 200 milligrams daily--200 mg 5 days a week.  With his last creatinine  having gone up to 1.72, we will repeat it today.  Baseline is in the 1.1-1.3 range  Blood pressure is somewhat elevated.  With his cardiomyopathy, we will not increase his carvedilol but continue it at 3.125 because of his resting bradycardia and that currently while not ventricularly pacing about 30%  of his beats are less than 60 bpm.  Continue his Entresto 49/51.   He is euvolemic.  We will continue him on Demadex taken 3 days a week

## 2022-06-27 ENCOUNTER — Telehealth: Payer: Self-pay | Admitting: Pharmacy Technician

## 2022-06-27 DIAGNOSIS — Z596 Low income: Secondary | ICD-10-CM

## 2022-06-27 NOTE — Progress Notes (Addendum)
[facility])                                            [place] Team   07/07/2022 Incoming call received from patient. HIPAA verified. Patient called to inform that he received the AZ&ME application for Farxiga and has placed in the mail back to [facility]. Ian Mills, [place]  [phone]  06/27/2022  Ian Mills [birth year] [id number]  Incoming call received from patient. HIPAA verified.  Patient called to inform he received the [employer] application for re enrollment for Entresto and the [facility] application for [employer] but has not received the A&ME application for [place].  Reminded patient that they were sent at different times. Also reminded patient that the mail may be delayed with the holiday but that it was mailed. Informed him to let me know if not received in the next 10 business days. Patient was agreeable to this plan.  Liahna Brickner P. [name], [place]  [phone]

## 2022-06-28 ENCOUNTER — Telehealth: Payer: Self-pay | Admitting: Internal Medicine

## 2022-06-28 NOTE — Telephone Encounter (Signed)
I spoke with the patient. He was calling to follow up on the results of his lab work with Dr. Caryl Comes from this week- CMET/ TSH.  I advised the patient that the results have not been reviewed by Dr. Caryl Comes, but preliminary results showed thyroid/ sodium/ potassium/ kidneys/ liver within normal limits.   The patient voices understanding and is agreeable.  He was appreciative of the call back.

## 2022-06-28 NOTE — Telephone Encounter (Signed)
Patient called and wanted to talk with Dr. Caryl Comes or nurse. Please call back

## 2022-07-03 ENCOUNTER — Encounter: Payer: Medicare Other | Admitting: Family Medicine

## 2022-07-07 ENCOUNTER — Telehealth: Payer: Self-pay | Admitting: Pharmacy Technician

## 2022-07-07 DIAGNOSIS — Z596 Low income: Secondary | ICD-10-CM

## 2022-07-07 NOTE — Progress Notes (Signed)
[facility])                                            [place] Team    07/07/2022  Ian Mills 06/23/1956 [id number]  Received patient and provider portion(s) of patient assistance application(s) for [employer]. Faxed completed application and required documents into [employer] and BI.    Micaella Gitto P. Quinto Tippy, [place]  [phone]

## 2022-07-09 NOTE — Progress Notes (Signed)
Remote ICD transmission.   

## 2022-07-10 ENCOUNTER — Telehealth: Payer: Self-pay | Admitting: Pharmacy Technician

## 2022-07-10 DIAGNOSIS — Z596 Low income: Secondary | ICD-10-CM

## 2022-07-10 NOTE — Progress Notes (Signed)
[facility])                                            [place] Team    07/10/2022  Ian Mills June 22, 1956 [id number]  Received both patient and provider portion(s) of patient assistance application(s) for [place]. Faxed completed application and required documents into AZ&ME.    Ashrith Sagan P. Edyth Glomb, [place]  [phone]

## 2022-07-16 ENCOUNTER — Telehealth: Payer: Self-pay

## 2022-07-16 ENCOUNTER — Telehealth: Payer: Self-pay | Admitting: Cardiovascular Disease

## 2022-07-16 NOTE — Telephone Encounter (Signed)
Received fax from [place] stating pt was approved form 07/29/22-07-29-2023  Received fax from [employer] stating pt was approved for Entresto until 07/29/23  Attempted to contact pt to make aware. Left message to call back.

## 2022-07-16 NOTE — Telephone Encounter (Signed)
Pt c/o medication issue:  1. Name of Medication: sacubitril-valsartan (ENTRESTO) 49-51 MG   2. How are you currently taking this medication (dosage and times per day)? Take 1 tablet by mouth 2 (two) times daily   3. Are you having a reaction (difficulty breathing--STAT)? no  4. What is your medication issue? Patient was seeing if our office have any samples. Please advise

## 2022-07-16 NOTE — Telephone Encounter (Signed)
Left a detail message stating samples placed up front for pick up.  Entresto 49-51 mg Qty: 1 bottle Lot # [id number] Exp: 3/25

## 2022-08-01 ENCOUNTER — Telehealth: Payer: Self-pay | Admitting: Internal Medicine

## 2022-08-01 ENCOUNTER — Telehealth: Payer: Self-pay

## 2022-08-01 MED ORDER — AMIODARONE HCL 200 MG PO TABS: ORAL_TABLET | ORAL | 0 refills | Status: DC

## 2022-08-01 NOTE — Telephone Encounter (Signed)
*  STAT* If patient is at the pharmacy, call can be transferred to refill team.   1. Which medications need to be refilled? (please list name of each medication and dose if known)  amiodarone (PACERONE) 200 MG tablet   2. Which pharmacy/location (including street and city if local pharmacy) is medication to be sent to?  [place], [place]    3. Do they need a 30 day or 90 day supply? 90 day supply    Patient is out of medication.

## 2022-08-01 NOTE — Telephone Encounter (Signed)
Spoke to Patient to let him know that the Cardiologist manages that medication.

## 2022-08-07 ENCOUNTER — Telehealth: Payer: Self-pay | Admitting: Family Medicine

## 2022-08-07 ENCOUNTER — Emergency Department
Admission: EM | Admit: 2022-08-07 | Discharge: 2022-08-07 | Disposition: A | Discharge status: Home / Self Care | Payer: Medicare Other | Attending: Student in an Organized Health Care Education/Training Program | Admitting: Student in an Organized Health Care Education/Training Program

## 2022-08-07 ENCOUNTER — Other Ambulatory Visit: Payer: Self-pay

## 2022-08-07 ENCOUNTER — Encounter: Payer: Self-pay | Admitting: Emergency Medicine

## 2022-08-07 ENCOUNTER — Emergency Department: Payer: Medicare Other

## 2022-08-07 DIAGNOSIS — I509 Heart failure, unspecified: Secondary | ICD-10-CM | POA: Insufficient documentation

## 2022-08-07 DIAGNOSIS — J441 Chronic obstructive pulmonary disease with (acute) exacerbation: Secondary | ICD-10-CM | POA: Diagnosis not present

## 2022-08-07 DIAGNOSIS — Z1152 Encounter for screening for COVID-19: Secondary | ICD-10-CM | POA: Diagnosis not present

## 2022-08-07 DIAGNOSIS — R059 Cough, unspecified: Secondary | ICD-10-CM | POA: Diagnosis not present

## 2022-08-07 DIAGNOSIS — R0602 Shortness of breath: Secondary | ICD-10-CM | POA: Diagnosis not present

## 2022-08-07 DIAGNOSIS — J40 Bronchitis, not specified as acute or chronic: Secondary | ICD-10-CM | POA: Insufficient documentation

## 2022-08-07 DIAGNOSIS — Z87891 Personal history of nicotine dependence: Secondary | ICD-10-CM | POA: Diagnosis not present

## 2022-08-07 LAB — CBC
HCT: 50.6 % (ref 39.0–52.0)
Hemoglobin: 15.9 g/dL (ref 13.0–17.0)
MCH: 29.9 pg (ref 26.0–34.0)
MCHC: 31.4 g/dL (ref 30.0–36.0)
MCV: 95.1 fL (ref 80.0–100.0)
Platelets: 196 10*3/uL (ref 150–400)
RBC: 5.32 MIL/uL (ref 4.22–5.81)
RDW: 13.7 % (ref 11.5–15.5)
WBC: 13.2 10*3/uL — ABNORMAL HIGH (ref 4.0–10.5)
nRBC: 0 % (ref 0.0–0.2)

## 2022-08-07 LAB — BASIC METABOLIC PANEL
Anion gap: 9 (ref 5–15)
BUN: 18 mg/dL (ref 8–23)
CO2: 21 mmol/L — ABNORMAL LOW (ref 22–32)
Calcium: 9 mg/dL (ref 8.9–10.3)
Chloride: 105 mmol/L (ref 98–111)
Creatinine, Ser: 1.08 mg/dL (ref 0.61–1.24)
GFR, Estimated: >60 mL/min (ref >60)
Glucose, Bld: 106 mg/dL — ABNORMAL HIGH (ref 70–99)
Potassium: 4.2 mmol/L (ref 3.5–5.1)
Sodium: 135 mmol/L (ref 135–145)

## 2022-08-07 LAB — RESP PANEL BY RT-PCR (RSV, FLU A&B, COVID)  RVPGX2
Influenza A by PCR: NEGATIVE
Influenza B by PCR: NEGATIVE
Resp Syncytial Virus by PCR: NEGATIVE
SARS Coronavirus 2 by RT PCR: NEGATIVE

## 2022-08-07 LAB — BRAIN NATRIURETIC PEPTIDE: B Natriuretic Peptide: 110.3 pg/mL — ABNORMAL HIGH (ref 0.0–100.0)

## 2022-08-07 MED ORDER — ACETAMINOPHEN 325 MG PO TABS
650.0000 mg | ORAL_TABLET | Freq: Once | ORAL | Status: AC
  Administered 2022-08-07: 650 mg via ORAL
  Filled 2022-08-07: qty 2

## 2022-08-07 MED ORDER — IPRATROPIUM-ALBUTEROL 0.5-2.5 (3) MG/3ML IN SOLN
3.0000 mL | Freq: Once | RESPIRATORY_TRACT | Status: AC
  Administered 2022-08-07: 3 mL via RESPIRATORY_TRACT
  Filled 2022-08-07: qty 3

## 2022-08-07 MED ORDER — PREDNISONE 20 MG PO TABS: 40.0000 mg | ORAL_TABLET | Freq: Every day | ORAL | 0 refills | Status: AC

## 2022-08-07 MED ORDER — BENZONATATE 100 MG PO CAPS
100.0000 mg | ORAL_CAPSULE | Freq: Once | ORAL | Status: AC
  Administered 2022-08-07: 100 mg via ORAL
  Filled 2022-08-07: qty 1

## 2022-08-07 MED ORDER — MINOCYCLINE HCL 100 MG PO CAPS: 100.0000 mg | ORAL_CAPSULE | Freq: Two times a day (BID) | ORAL | 0 refills | Status: AC

## 2022-08-07 MED ORDER — BENZONATATE 100 MG PO CAPS: 100.0000 mg | ORAL_CAPSULE | Freq: Three times a day (TID) | ORAL | 0 refills | Status: DC | PRN

## 2022-08-07 MED ORDER — IPRATROPIUM-ALBUTEROL 0.5-2.5 (3) MG/3ML IN SOLN: 3.0000 mL | Freq: Four times a day (QID) | RESPIRATORY_TRACT | 11 refills | Status: DC | PRN

## 2022-08-07 MED ORDER — PREDNISONE 20 MG PO TABS
60.0000 mg | ORAL_TABLET | Freq: Once | ORAL | Status: AC
  Administered 2022-08-07: 60 mg via ORAL
  Filled 2022-08-07: qty 3

## 2022-08-07 NOTE — Telephone Encounter (Signed)
Patient wanted to see you but was advised to go to the ED. Patient states he is just leaving [facility] ED and he was diagnosed with Bronchitis and COPD excarbation .Patient states they gave him a few nebulizer treatments and some other medications, EKG & lab work. Patient is going home to take his medications and rest. Patient has an appointment with you on 08/14/22. Does Patient need another appointment?

## 2022-08-07 NOTE — ED Provider Notes (Signed)
[facility] Provider Note    Event Date/Time   First MD Initiated Contact with Patient 08/07/22 1116     (approximate)   History   Shortness of Breath   HPI  Ian Mills is a 67 y.o. male with a history of COPD previous smoking history as well as extensive cardiac history presents to the ER for evaluation of several days of cough congestion generalized malaise.  States productive cough.  No measured temperature has had some chills.  No recent antibiotics or prednisone.     Physical Exam   Triage Vital Signs: ED Triage Vitals [08/07/22 1049]  Enc Vitals Group     BP (!) 161/95     Pulse Rate 79     Resp 18     Temp 98.5 F (36.9 C)     Temp Source Oral     SpO2 98 %     Weight 195 lb (88.5 kg)     Height '5\' 8"'$  (1.727 m)     Head Circumference      Peak Flow      Pain Score 8     Pain Loc      Pain Edu?      Excl. in [place]?     Most recent vital signs: Vitals:   08/07/22 1049  BP: (!) 161/95  Pulse: 79  Resp: 18  Temp: 98.5 F (36.9 C)  SpO2: 98%     Constitutional: Alert  Eyes: Conjunctivae are normal.  Head: Atraumatic. Nose: No congestion/rhinnorhea. Mouth/Throat: Mucous membranes are moist.   Neck: Painless ROM.  Cardiovascular:   Good peripheral circulation. Respiratory: Normal respiratory effort.  Diffuse coarse expiratory wheeze throughout.  No crackles.  No lower extremity swelling. Gastrointestinal: Soft and nontender.  Musculoskeletal:  no deformity Neurologic:  MAE spontaneously. No gross focal neurologic deficits are appreciated.  Skin:  Skin is warm, dry and intact. No rash noted. Psychiatric: Mood and affect are normal. Speech and behavior are normal.    ED Results / Procedures / Treatments   Labs (all labs ordered are listed, but only abnormal results are displayed) Labs Reviewed  BASIC METABOLIC PANEL - Abnormal; Notable for the following components:      Result Value   CO2 21 (*)    Glucose, Bld 106  (*)    All other components within normal limits  CBC - Abnormal; Notable for the following components:   WBC 13.2 (*)    All other components within normal limits  BRAIN NATRIURETIC PEPTIDE - Abnormal; Notable for the following components:   B Natriuretic Peptide 110.3 (*)    All other components within normal limits  RESP PANEL BY RT-PCR (RSV, FLU A&B, COVID)  [id number]     EKG  ED ECG REPORT I, Merlyn Lot, the attending physician, personally viewed and interpreted this ECG.   Date: 08/07/2022  EKG Time: 10":56  Rate: 77  Rhythm: sinus  Axis: left  Intervals: normal  ST&T Change:  no stemi, no depressions    RADIOLOGY Please see ED Course for my review and interpretation.  I personally reviewed all radiographic images ordered to evaluate for the above acute complaints and reviewed radiology reports and findings.  These findings were personally discussed with the patient.  Please see medical record for radiology report.    PROCEDURES:  Critical Care performed: No  Procedures   MEDICATIONS ORDERED IN ED: Medications  ipratropium-albuterol (DUONEB) 0.5-2.5 (3) MG/3ML nebulizer solution 3 mL (3 mLs Nebulization  Given 08/07/22 1208)  predniSONE (DELTASONE) tablet 60 mg (60 mg Oral Given 08/07/22 1206)  benzonatate (TESSALON) capsule 100 mg (100 mg Oral Given 08/07/22 1206)  acetaminophen (TYLENOL) tablet 650 mg (650 mg Oral Given 08/07/22 1213)  ipratropium-albuterol (DUONEB) 0.5-2.5 (3) MG/3ML nebulizer solution 3 mL (3 mLs Nebulization Given 08/07/22 1233)     IMPRESSION / MDM / ASSESSMENT AND PLAN / ED COURSE  I reviewed the triage vital signs and the nursing notes.                              Differential diagnosis includes, but is not limited to, Asthma, copd, CHF, pna, ptx, malignancy, Pe, anemia  Patient presenting to the ER for evaluation of symptoms as described above.  Based on symptoms, risk factors and considered above differential, this presenting  complaint could reflect a potentially life-threatening illness therefore the patient will be placed on continuous pulse oximetry and telemetry for monitoring.  Laboratory evaluation will be sent to evaluate for the above complaints.  Patient nontoxic-appearing exam concerning for COPD exacerbation, bronchitis.  Chest x-ray on my review and interpretation does not show any evidence of consolidation or effusion.  Will test for viral pathogens.  Will give nebulizer treatment as well as prednisone.  Have a low suspicion for PE.  Does not seem consistent with CHF.   Clinical Course as of 08/07/22 1240  Wed Aug 07, 2022  1234 Patient with significant improvement after nebulizer.  Will treat for bronchitis.  Do not feel the patient requires hospitalization as he is not hypoxic in no respiratory distress.  Does appear stable and appropriate for outpatient management. [PR]    Clinical Course User Index [PR] Merlyn Lot, MD      FINAL CLINICAL IMPRESSION(S) / ED DIAGNOSES   Final diagnoses:  Bronchitis  COPD exacerbation ([name])     Rx / DC Orders   ED Discharge Orders          Ordered    ipratropium-albuterol (DUONEB) 0.5-2.5 (3) MG/3ML SOLN  Every 6 hours PRN        08/07/22 1232    benzonatate (TESSALON PERLES) 100 MG capsule  3 times daily PRN        08/07/22 1232    predniSONE (DELTASONE) 20 MG tablet  Daily        08/07/22 1232    minocycline (MINOCIN) 100 MG capsule  2 times daily        08/07/22 1232             Note:  This document was prepared using Dragon voice recognition software and may include unintentional dictation errors.    Merlyn Lot, MD 08/07/22 1240

## 2022-08-07 NOTE — Telephone Encounter (Signed)
Pt called stating he is congested, has a cough, his chest hurt and he can't breathe sent to access nurse

## 2022-08-07 NOTE — ED Triage Notes (Signed)
Patient to ED via POV for SOB for the past week. Patient has productive cough with grey colored sputum. Patient also c/o headache and body aches. Hx of CHF and COPD.

## 2022-08-07 NOTE — ED Provider Triage Note (Signed)
Emergency Medicine Provider Triage Evaluation Note  Ian Mills , a 67 y.o. male  was evaluated in triage.  Pt complains of productive cough and short of breath for the past week. Hx of CHF, COPD and MI.    Review of Systems  Positive: Headache, cough Negative:   Physical Exam  BP (!) 161/95   Pulse 79   Temp 98.5 F (36.9 C) (Oral)   Resp 18   Ht '5\' 8"'$  (1.727 m)   Wt 88.5 kg   SpO2 98%   BMI 29.65 kg/m  Gen:   Awake, no distress   Resp:  Normal effort.  Congested cough, occasional rales.   MSK:   Moves extremities without difficulty  Other:    Medical Decision Making  Medically screening exam initiated at 11:01 AM.  Appropriate orders placed.  Ian Mills was informed that the remainder of the evaluation will be completed by another provider, this initial triage assessment does not replace that evaluation, and the importance of remaining in the ED until their evaluation is complete.     Johnn Hai, PA-C 08/07/22 1104

## 2022-08-11 ENCOUNTER — Other Ambulatory Visit: Payer: Self-pay | Admitting: Family Medicine

## 2022-08-11 DIAGNOSIS — F419 Anxiety disorder, unspecified: Secondary | ICD-10-CM

## 2022-08-12 ENCOUNTER — Telehealth: Payer: Self-pay | Admitting: Pharmacy Technician

## 2022-08-12 DIAGNOSIS — Z596 Low income: Secondary | ICD-10-CM

## 2022-08-12 NOTE — Progress Notes (Signed)
[place] [facility])                                            [place] Team    08/12/2022  ABDELAZIZ WESTENBERGER October 03, 1955 [id number]  Care coordination call placed to [facility] in regard to [facility] application.  Spoke to Essie who informs patient is APPROVED 07/29/22-07/29/23. Medication will need to be refilled each time with patient calling BI for his refills and once processed they will be delivered to his home address.  Sahas Sluka P. Hydie Langan, [place]  [phone]

## 2022-08-13 ENCOUNTER — Telehealth: Payer: Self-pay | Admitting: Pharmacy Technician

## 2022-08-13 DIAGNOSIS — Z596 Low income: Secondary | ICD-10-CM

## 2022-08-13 NOTE — Progress Notes (Signed)
[facility])                                            [place] Team    08/13/2022  Ian Mills 04-15-1956 [id number]  Care coordination calls placed to [employer] and [employer].  Care coordination call #1 placed to [employer] in regard to [facility] application.  Spoke to [place] who informs patient is APPROVED 07/29/22-07/29/23. Patient will need to call [employer] as he needs refills allowing 14 business days for them to process and ship out medication to the patient's home.  Care coordination call #2 placed to [employer] in regard to [facility] application.  Spoke to [place] who informs patient is APPROVED 07/29/22-07/29/23. Medication will auto fill and deliver to the patinet's home based on last fill in 2023.   [name] P. Kamaya Keckler, [place]  [phone]

## 2022-08-14 ENCOUNTER — Ambulatory Visit: Payer: Medicare Other | Admitting: Family Medicine

## 2022-08-14 ENCOUNTER — Telehealth: Payer: Self-pay | Admitting: Family Medicine

## 2022-08-14 NOTE — Telephone Encounter (Signed)
Caller name:   Relationship to patient: Can be reached: Pharmacy: Last visit:   Situation: (What circumstance or reason brought patient into the office?)  Background   Symptoms:cough, hard breathing, cough    '@LASTVISITSECTIONDTEXT'$ @    Attributing factors (medication changes, positional changes, etc. )      Duration :10 days   Pain Scale?  On 1-10 how woiuld you rate your pain? What makes it better or worse?    Blood pressure            Pulse             Temp           Dizziness : Orthostatic BP: Lying           Sitting         Standing      Urinary Symptoms POCT UA. Normal           Abnormal  Palpitations : Chest pain:  Increased Pulse : Get ECG . ( Make PCP aware first patient having chest pain).

## 2022-08-14 NOTE — Telephone Encounter (Signed)
Spoke with pt and he stated that he is still coughing, and wheezing. Pt is requesting another round of abx and benzonatate refill. Pt is scheduled for tomorrow with Theresia Lo, NP tomorrow at 1:40 pm.

## 2022-08-15 ENCOUNTER — Ambulatory Visit (INDEPENDENT_AMBULATORY_CARE_PROVIDER_SITE_OTHER): Payer: Medicare Other | Admitting: Nurse Practitioner

## 2022-08-15 ENCOUNTER — Telehealth: Payer: Self-pay | Admitting: Family Medicine

## 2022-08-15 ENCOUNTER — Encounter: Payer: Self-pay | Admitting: Nurse Practitioner

## 2022-08-15 VITALS — BP 120/70 | HR 64 | Temp 97.4°F | Resp 18 | Ht 68.0 in | Wt 196.8 lb

## 2022-08-15 DIAGNOSIS — R051 Acute cough: Secondary | ICD-10-CM | POA: Diagnosis not present

## 2022-08-15 DIAGNOSIS — D72829 Elevated white blood cell count, unspecified: Secondary | ICD-10-CM

## 2022-08-15 LAB — CBC WITH DIFFERENTIAL/PLATELET
Basophils Absolute: 0.1 10*3/uL (ref 0.0–0.1)
Basophils Relative: 0.7 % (ref 0.0–3.0)
Eosinophils Absolute: 0.1 10*3/uL (ref 0.0–0.7)
Eosinophils Relative: 1.1 % (ref 0.0–5.0)
HCT: 47.6 % (ref 39.0–52.0)
Hemoglobin: 15.5 g/dL (ref 13.0–17.0)
Lymphocytes Relative: 39.4 % (ref 12.0–46.0)
Lymphs Abs: 4.1 10*3/uL — ABNORMAL HIGH (ref 0.7–4.0)
MCHC: 32.6 g/dL (ref 30.0–36.0)
MCV: 93.5 fl (ref 78.0–100.0)
Monocytes Absolute: 1.3 10*3/uL — ABNORMAL HIGH (ref 0.1–1.0)
Monocytes Relative: 12 % (ref 3.0–12.0)
Neutro Abs: 4.9 10*3/uL (ref 1.4–7.7)
Neutrophils Relative %: 46.8 % (ref 43.0–77.0)
Platelets: 220 10*3/uL (ref 150.0–400.0)
RBC: 5.09 Mil/uL (ref 4.22–5.81)
RDW: 14.3 % (ref 11.5–15.5)
WBC: 10.5 10*3/uL (ref 4.0–10.5)

## 2022-08-15 MED ORDER — HYDROCOD POLI-CHLORPHE POLI ER 10-8 MG/5ML PO SUER: 5.0000 mL | Freq: Every evening | ORAL | 0 refills | Status: DC | PRN

## 2022-08-15 NOTE — Telephone Encounter (Signed)
Pt notified that Tussionex has been sent in

## 2022-08-15 NOTE — Progress Notes (Signed)
Established Patient Office Visit  Subjective:  Patient ID: Ian Mills, male    DOB: 1956/02/02  Age: 67 y.o. MRN: [id number]  CC:  Chief Complaint  Patient presents with   Cough    Coughing up thick clear mucous fo over a week. C/O wheezing, chest rattling, & lungs feel tight. Denies fever, N/V, PND, chest pain. Went to ER on 08/07/22     HPI  [name] presents for cough  Cough This is a recurrent problem. The current episode started 1 to 4 weeks ago. The problem has been unchanged. The cough is Productive of sputum. Associated symptoms include headaches and shortness of breath. Pertinent negatives include no chest pain or fever.    Patient has been to the ER 8 days ago with shortness of breath and cough and was diagnosed with bronchitis and COPD exacerbation.  He had elevated WBCs while in the ER.  And was treated with minocycline 100 mg twice a day for 7 days, prednisone 40 mg for 5 days, benzonatate and DuoNeb.  Patient has completed a dose of antibiotic and prednisone but he still complains of cough.  Past Medical History:  Diagnosis Date   Acute anterior wall MI ([place]) 07/04/1991   a.) LHC: EF 29%; 100% mLAD -> PTCA performed   Anxiety    Aortic atherosclerosis (HCC)    CHF (congestive heart failure) (HCC)    COPD (chronic obstructive pulmonary disease) (HCC)    Coronary artery disease    a.) 3 stents --> 3x18 mm Duet to OM3, 2.5x18 mm Duet to D1, 2.5x13 mm Duet to RPDA. b. 06/2013 PCI: CTO LAD, patent LAD stent, RCA 70p (FFR 0.74--> DES), 9m(DES), RPDA 50%, EF 20%; c. 08/2019 Cath: LM nl, LAD 100p/m, D1 60, D2 patent stent, LCX 60ost/p, 460mSR, RCA 30p, 2033mR.   Dizziness 03/30/2015   Family history of lung cancer    HFrEF (heart failure with reduced ejection fraction) ([place]CC[place]ulph[place]  a. 05/2014 Echo: EF 25-30%; b. 01/2018 Echo ([facility]): EF 30%; c. 06/2021 Echo: EF 25-30%, apical AK, GrI DD, nl RV fxn.   History of prostate surgery 06/09/2020   HLD (hyperlipidemia)     HTN (hypertension)    ICD  single,BSX    a. DOI 11/2011; b. S/N# [id number] 06/01/2014   Ischemic cardiomyopathy    a. 05/2014 Echo: EF 25-30%; b. 01/2018 Echo ([facility]): EF 30%, c. 06/2021 Echo: EF 25-30%.   Lateral wall myocardial infarction (HCCHammont[place]2/08/1996   a.) LHC: EF 44%; 75% RPDA and 95% OM3; PCI performed and a 3.0 x 18 mm Duet stent placed to [place] current use of antithrombotics/antiplatelets    a.) DAPT therapy (ASA + clopidogrel)   NSTEMI (non-ST elevated myocardial infarction) (HCCSible[place]5/07/2011   a.) LHC: 100% mLAD; intervention deferred opting for medical management.   NSTEMI (non-ST elevated myocardial infarction) (HCCMur[place]1/26/2017   Personal history of colonic polyps    Pneumonia 08/2018   Sacroiliac pain 07/31/2016   Formatting of this note might be different from the original. Added automatically from request for surgery [id number] (HCCDumfries1/07/2021   Sepsis due to group A Streptococcus with acute renal failure and septic shock (HCCDesch[place]  Syncope and collapse    Tobacco abuse 06/13/2016   Ventricular tachycardia (HCCNew Boston5/11/2011   a.) VT arrest; ROSC achieved --> [place]hach[place].    Past Surgical History:  Procedure Laterality Date  APPENDECTOMY N/A    CARDIAC DEFIBRILLATOR PLACEMENT N/A 12/01/2011   COLONOSCOPY WITH PROPOFOL N/A 12/22/2020   Procedure: COLONOSCOPY WITH PROPOFOL;  Surgeon: Jonathon Bellows, MD;  [facility] ENDOSCOPY;  Service: Gastroenterology;  Laterality: N/A;   CORONARY ANGIOPLASTY Left 07/05/1991   Procedure: CORONARY ANGIOPLASTY; Location: [facility]; Surgeon: Doristine Bosworth, MD   CORONARY ANGIOPLASTY Left 08/06/1991   Procedure: CORONARY ANGIOPLASTY; Location: [facility]; Surgeon: Ellouise Newer, MD   CORONARY ANGIOPLASTY Left 08/09/1991   Procedure: CORONARY ANGIOPLASTY; Location: [facility]; Surgeon: Ellouise Newer, MD   CORONARY ANGIOPLASTY Left 10/28/1991   Procedure: CORONARY ANGIOPLASTY; Location: [facility]; Surgeon: Ellouise Newer, MD    CORONARY ANGIOPLASTY Left 11/02/1991   Procedure: CORONARY ANGIOPLASTY; Location: [facility]; Surgeon: Ellouise Newer, MD   CORONARY ANGIOPLASTY Left 01/10/1992   Procedure: CORONARY ANGIOPLASTY; Location: [facility]; Surgeon: Carles Collet, MD   CORONARY ANGIOPLASTY Left 09/26/1993   Procedure: CORONARY ANGIOPLASTY; Location: [facility]; Surgeon: Melton Alar, MD   CORONARY ANGIOPLASTY Left 09/11/2006   Procedure: CORONARY ANGIOPLASTY; Location: [facility]   CORONARY ANGIOPLASTY Left 01/17/2010   Procedure: CORONARY ANGIOPLASTY; Location: [facility]; Surgeon: Wendee Beavers, MD   CORONARY ANGIOPLASTY Left 11/27/2011   Procedure: CORONARY ANGIOPLASTY; Location: [facility]; Surgeon: Derinda Sis, MD   CORONARY ANGIOPLASTY WITH STENT PLACEMENT Left 06/29/1997   Procedure: CORONARY ANGIOPLASTY WITH STENT PLACEMENT (3.0 x 18 mm Diet stent to OM3); Location: [facility]; Surgeon: Allie Bossier, MD   CORONARY ANGIOPLASTY WITH STENT PLACEMENT Left 07/12/1998   Procedure: CORONARY ANGIOPLASTY WITH STENT PLACEMENT (2.8 x 8 mm Duet stent to D1, 2.5 x 13 mm Duet stent to RPDA); Location: [facility]   GRAFT APPLICATION Left 9/51/8841   Procedure: GRAFT APPLICATION;  Surgeon: Benjamine Sprague, DO;  Location: [facility] ORS;  Service: General;  Laterality: Left;   LEFT HEART CATH AND CORONARY ANGIOGRAPHY Left 08/30/2019   Procedure: LEFT HEART CATH AND CORONARY ANGIOGRAPHY;  Surgeon: Wellington Hampshire, MD;  Location: [facility] CV LAB;  Service: Cardiovascular;  Laterality: Left;   PROSTATE SURGERY     urethra grew around prostate    TUMOR REMOVAL  2001   chest thymic cyst; behind lungs [facility] benign     Family History  Problem Relation Age of Onset   Cancer - Lung Mother    Hypertension Mother    Heart attack Mother    Cancer Father        larynx   Dementia Maternal Grandmother    Heart attack Paternal Grandfather    Colon cancer Neg Hx    Stomach cancer Neg Hx    Pancreatic cancer Neg Hx     Social History   Socioeconomic History   Marital  status: Legally Separated    Spouse name: Ian Mills    Number of children: 2   Years of education: Not on file   Highest education level: Not on file  Occupational History   Occupation: retired   Tobacco Use   Smoking status: Former    Packs/day: 0.50    Years: 42.00    Total pack years: 21.00    Types: Cigarettes    Quit date: 11/10/2019    Years since quitting: 2.7   Smokeless tobacco: Never  Vaping Use   Vaping Use: Never used  Substance and Sexual Activity   Alcohol use: Yes    Alcohol/week: 4.0 standard drinks of alcohol    Types: 4 Cans of beer per week    Comment: per week   Drug use: No   Sexual activity: Not on file  Other Topics Concern  Not on file  Social History Narrative   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    Lives in [place]    2 kids daughters    Gets regular exercise   Smoker   [employer]    Social Determinants of Health   Financial Resource Strain: Low Risk  (05/02/2022)   Overall Financial Resource Strain (CARDIA)    Difficulty of Paying Living Expenses: Not hard at all  Food Insecurity: No Food Insecurity (05/02/2022)   Hunger Vital Sign    Worried About Running Out of Food in the Last Year: Never true    Ran Out of Food in the Last Year: Never true  Transportation Needs: No Transportation Needs (05/02/2022)   PRAPARE - Hydrologist (Medical): No    Lack of Transportation (Non-Medical): No  Physical Activity: Insufficiently Active (05/02/2022)   Exercise Vital Sign    Days of Exercise per Week: 4 days    Minutes of Exercise per Session: 20 min  Stress: No Stress Concern Present (05/02/2022)   [place]    Feeling of Stress : Not at all  Social Connections: Unknown (05/02/2022)   Social Connection and Isolation Panel [NHANES]    Frequency of Communication with Friends and Family:  Not on file    Frequency of Social Gatherings with Friends and Family: Not on file    Attends Religious Services: Not on file    Active Member of Clubs or Organizations: Not on file    Attends Archivist Meetings: Not on file    Marital Status: Married  Intimate Partner Violence: Not At Risk (05/02/2022)   Humiliation, Afraid, Rape, and Kick questionnaire    Fear of Current or Ex-Partner: No    Emotionally Abused: No    Physically Abused: No    Sexually Abused: No     Outpatient Medications Prior to Visit  Medication Sig Dispense Refill   albuterol (VENTOLIN HFA) 108 (90 Base) MCG/ACT inhaler Inhale 1-2 puffs into the lungs every 6 (six) hours as needed for wheezing or shortness of breath. 18 g 11   ALPRAZolam (XANAX) 0.5 MG tablet TAKE 1 TABLET BY MOUTH THREE TIMES DAILY AS NEEDED FOR ANXIETY 90 tablet 0   amiodarone (PACERONE) 200 MG tablet Take 1 tablet (200 mg) by mouth 5 days a week 90 tablet 0   aspirin EC 81 MG tablet Take 81 mg by mouth daily. Swallow whole.     atorvastatin (LIPITOR) 80 MG tablet Take 1 tablet (80 mg total) by mouth daily. 90 tablet 3   carvedilol (COREG) 3.125 MG tablet Take 1 tablet (3.125 mg total) by mouth 2 (two) times daily. 180 tablet 3   clopidogrel (PLAVIX) 75 MG tablet Take 1 tablet (75 mg total) by mouth daily. 90 tablet 3   dapagliflozin propanediol (FARXIGA) 10 MG TABS tablet Take 1 tablet (10 mg total) by mouth daily before breakfast. 90 tablet 3   ezetimibe (ZETIA) 10 MG tablet Take 1 tablet (10 mg total) by mouth daily. 90 tablet 3   methocarbamol (ROBAXIN) 500 MG tablet Take 500 mg by mouth every 8 (eight) hours as needed.     potassium chloride (KLOR-CON 10) 10 MEQ tablet Take 1 tablet (10 mEq total) by mouth daily. 90 tablet 3   sacubitril-valsartan (ENTRESTO) 49-51 MG Take 1 tablet by mouth 2 (two)  times daily. 180 tablet 1   spironolactone (ALDACTONE) 25 MG tablet Take 1 tablet (25 mg total) by mouth daily. 90 tablet 3   tadalafil  (CIALIS) 10 MG tablet Take 1-2 tablets (10-20 mg total) by mouth daily as needed for erectile dysfunction. 30 tablet 11   Tiotropium Bromide-Olodaterol (STIOLTO RESPIMAT) 2.5-2.5 MCG/ACT AERS Inhale 2 puffs into the lungs daily. 4 g 11   torsemide (DEMADEX) 20 MG tablet Take 1 tablet (20 mg) by mouth on M, W, F.  You may take 1 extra tablet (20 mg) by mouth once daily as needed for swelling/ weight gain/ shortness of breath 135 tablet 5   benzonatate (TESSALON PERLES) 100 MG capsule Take 1 capsule (100 mg total) by mouth 3 (three) times daily as needed for cough. 30 capsule 0   ipratropium-albuterol (DUONEB) 0.5-2.5 (3) MG/3ML SOLN Take 3 mLs by nebulization every 6 (six) hours as needed. (Patient not taking: Reported on 08/15/2022) 360 mL 11   No facility-administered medications prior to visit.    Allergies  Allergen Reactions   Nitroglycerin Nausea Only and Other (See Comments)    Per patient "causes severe  headache"    ROS Review of Systems  Constitutional:  Negative for fever.  HENT: Negative.    Eyes: Negative.   Respiratory:  Positive for cough and shortness of breath.   Cardiovascular:  Negative for chest pain.  Gastrointestinal: Negative.   Genitourinary: Negative.   Musculoskeletal: Negative.   Neurological:  Positive for headaches.  Psychiatric/Behavioral: Negative.        Objective:    Physical Exam Constitutional:      Appearance: Normal appearance. He is normal weight.  HENT:     Right Ear: Tympanic membrane normal.     Left Ear: Tympanic membrane normal.     Nose: Congestion present.  Eyes:     Pupils: Pupils are equal, round, and reactive to light.  Cardiovascular:     Rate and Rhythm: Normal rate and regular rhythm.     Pulses: Normal pulses.     Heart sounds: Normal heart sounds.  Pulmonary:     Effort: Pulmonary effort is normal.     Breath sounds: Normal breath sounds.  Abdominal:     General: Bowel sounds are normal.     Palpations: Abdomen is  soft.  Skin:    General: Skin is warm.  Neurological:     General: No focal deficit present.     Mental Status: He is alert and oriented to person, place, and time. Mental status is at baseline.  Psychiatric:        Mood and Affect: Mood normal.        Behavior: Behavior normal.        Thought Content: Thought content normal.        Judgment: Judgment normal.     BP 120/70   Pulse 64   Temp (!) 97.4 F (36.3 C) (Oral)   Resp 18   Ht '5\' 8"'$  (1.727 m)   Wt 196 lb 12 oz (89.2 kg)   SpO2 99%   BMI 29.92 kg/m  Wt Readings from Last 3 Encounters:  08/15/22 196 lb 12 oz (89.2 kg)  08/07/22 195 lb (88.5 kg)  06/25/22 193 lb (87.5 kg)     Health Maintenance  Topic Date Due   Lung Cancer Screening  02/15/2021   COVID-19 Vaccine (7 - 2023-24 season) 03/29/2022   Zoster Vaccines- Shingrix (2 of 2) 06/24/2022   Diabetic kidney evaluation -  Urine ACR  04/30/2027 (Originally 03/03/2021)   Medicare Annual Wellness (AWV)  05/03/2023   Diabetic kidney evaluation - eGFR measurement  08/08/2023   DTaP/Tdap/Td (3 - Td or Tdap) 03/03/2030   COLONOSCOPY (Pts 45-29yr Insurance coverage will need to be confirmed)  12/23/2030   Pneumonia Vaccine 67 Years old  Completed   INFLUENZA VACCINE  Completed   Hepatitis C Screening  Completed   HPV VACCINES  Aged Out   FOOT EXAM  Discontinued   HEMOGLOBIN A1C  Discontinued   OPHTHALMOLOGY EXAM  Discontinued    There are no preventive care reminders to display for this patient.  Lab Results  Component Value Date   TSH 1.834 06/25/2022   Lab Results  Component Value Date   WBC 10.5 08/15/2022   HGB 15.5 08/15/2022   HCT 47.6 08/15/2022   MCV 93.5 08/15/2022   PLT 220.0 08/15/2022   Lab Results  Component Value Date   NA 135 08/07/2022   K 4.2 08/07/2022   CO2 21 (L) 08/07/2022   GLUCOSE 106 (H) 08/07/2022   BUN 18 08/07/2022   CREATININE 1.08 08/07/2022   BILITOT 1.0 06/25/2022   ALKPHOS 93 06/25/2022   AST 18 06/25/2022   ALT 24  06/25/2022   PROT 7.9 06/25/2022   ALBUMIN 4.1 06/25/2022   CALCIUM 9.0 08/07/2022   ANIONGAP 9 08/07/2022   EGFR 59 (L) 10/03/2021   GFR 52.53 (L) 02/27/2022   Lab Results  Component Value Date   CHOL 122 02/27/2022   Lab Results  Component Value Date   HDL 38.30 (L) 02/27/2022   Lab Results  Component Value Date   LDLCALC 66 02/27/2022   Lab Results  Component Value Date   TRIG 90.0 02/27/2022   Lab Results  Component Value Date   CHOLHDL 3 02/27/2022   Lab Results  Component Value Date   HGBA1C 5.6 04/18/2021      Assessment & Plan:   Problem List Items Addressed This Visit       Other   Acute cough - Primary    No adventitious sounds heard on auscultation. Encourage patient to use albuterol every 4 hours as needed and stiolto respimat inhaler 2 puff daily. Started him on Tussionex 5 ml at bedtime for cough as needed.       Leukocytosis   Relevant Orders   CBC w/Diff (Completed)     Meds ordered this encounter  Medications   chlorpheniramine-HYDROcodone (TUSSIONEX) 10-8 MG/5ML    Sig: Take 5 mLs by mouth at bedtime as needed for cough.    Dispense:  70 mL    Refill:  0     Follow-up: Return if symptoms worsen or fail to improve.    CTheresia Lo NP

## 2022-08-15 NOTE — Telephone Encounter (Signed)
Pt called stating the medication is not at the pharmacy that the provider gave

## 2022-08-16 DIAGNOSIS — R051 Acute cough: Secondary | ICD-10-CM | POA: Insufficient documentation

## 2022-08-16 DIAGNOSIS — D72829 Elevated white blood cell count, unspecified: Secondary | ICD-10-CM | POA: Insufficient documentation

## 2022-08-16 HISTORY — DX: Acute cough: R05.1

## 2022-08-16 HISTORY — DX: Elevated white blood cell count, unspecified: D72.829

## 2022-08-16 NOTE — Assessment & Plan Note (Signed)
He had elevated leukocytes 13.2 on 08/07/2022. Will repeat CBC and call patient with result.

## 2022-08-16 NOTE — Progress Notes (Signed)
Results are normal. No infection. Please call the pt.

## 2022-08-16 NOTE — Assessment & Plan Note (Signed)
No adventitious sounds heard on auscultation. Encourage patient to use albuterol every 4 hours as needed and stiolto respimat inhaler 2 puff daily. Started him on Tussionex 5 ml at bedtime for cough as needed.

## 2022-08-19 ENCOUNTER — Telehealth: Payer: Self-pay

## 2022-08-19 NOTE — Telephone Encounter (Signed)
        Patient  visited [place] on 1/10    Telephone encounter attempt : [place], [facility]  [phone] [address], [place], [place] [address] Phone: [phone] Email: [email or web address]

## 2022-08-20 ENCOUNTER — Telehealth: Payer: Self-pay

## 2022-08-20 NOTE — Telephone Encounter (Signed)
        Patient  visited [place] on 1/10   Telephone encounter attempt :  2nd  A HIPAA compliant voice message was left requesting a return call.  Instructed patient to call back     [place], [place] [facility]  [phone] [address], [place], [place] [address] Phone: [phone] Email: [email or web address]

## 2022-08-22 ENCOUNTER — Ambulatory Visit: Payer: Medicare Other | Admitting: Urology

## 2022-08-26 ENCOUNTER — Encounter: Payer: Self-pay | Admitting: Family Medicine

## 2022-08-26 ENCOUNTER — Ambulatory Visit (INDEPENDENT_AMBULATORY_CARE_PROVIDER_SITE_OTHER): Payer: Medicare Other | Admitting: Family Medicine

## 2022-08-26 VITALS — BP 110/70 | HR 72 | Temp 97.2°F | Resp 17 | Ht 68.0 in | Wt 196.5 lb

## 2022-08-26 DIAGNOSIS — F419 Anxiety disorder, unspecified: Secondary | ICD-10-CM

## 2022-08-26 DIAGNOSIS — F39 Unspecified mood [affective] disorder: Secondary | ICD-10-CM | POA: Diagnosis not present

## 2022-08-26 DIAGNOSIS — I10 Essential (primary) hypertension: Secondary | ICD-10-CM | POA: Diagnosis not present

## 2022-08-26 DIAGNOSIS — I5022 Chronic systolic (congestive) heart failure: Secondary | ICD-10-CM

## 2022-08-26 DIAGNOSIS — I700 Atherosclerosis of aorta: Secondary | ICD-10-CM | POA: Diagnosis not present

## 2022-08-26 DIAGNOSIS — N1831 Chronic kidney disease, stage 3a: Secondary | ICD-10-CM | POA: Diagnosis not present

## 2022-08-26 DIAGNOSIS — Z122 Encounter for screening for malignant neoplasm of respiratory organs: Secondary | ICD-10-CM | POA: Diagnosis not present

## 2022-08-26 MED ORDER — ALPRAZOLAM 0.5 MG PO TABS: 0.5000 mg | ORAL_TABLET | Freq: Three times a day (TID) | ORAL | 0 refills | Status: DC | PRN

## 2022-08-26 MED ORDER — ALPRAZOLAM 0.5 MG PO TABS: 0.5000 mg | ORAL_TABLET | Freq: Three times a day (TID) | ORAL | 0 refills | Status: AC | PRN

## 2022-08-26 NOTE — Patient Instructions (Addendum)
It was a pleasure meeting you today. Thank you for allowing me to take part in your health care.  Our goals for today as we discussed include:  Refilled Xanax  Due for CT lung cancer screening. Referral sent.  Recommend Shingles vaccine.  This is a 2 dose series and can be given at your local pharmacy.  Please talk to your pharmacist about this.    If you have any questions or concerns, please do not hesitate to call the office at [phone].  I look forward to our next visit and until then take care and stay safe.  Regards,   Carollee Leitz, MD   [facility]

## 2022-08-26 NOTE — Progress Notes (Signed)
SUBJECTIVE:   Chief Complaint  Patient presents with   Follow-up    3 mth f/u-Xanax is helping, He is no longer smoking or drinking   HPI Patient presents to clinic for refill Xanax. Doing well.  Tried decreasing to twice daily but was not effective.  Does better with 3 times daily dosing.  Denies any SI/HI.  HFrEF Asymptomatic.  Compliant with current medications.  Entresto dose recently decreased.  Has not been taking Demadex as prescribed given low blood pressures.  Denies any recent weight gain or lower extremity edema.  Follows with cardiology.     PERTINENT PMH / PSH: Previous tobacco use  OBJECTIVE:  BP 110/70   Pulse 72   Temp (!) 97.2 F (36.2 C) (Oral)   Resp 17   Ht '5\' 8"'$  (1.727 m)   Wt 196 lb 8 oz (89.1 kg)   SpO2 98%   BMI 29.88 kg/m    Physical Exam Vitals reviewed.  Constitutional:      General: He is not in acute distress.    Appearance: Normal appearance. He is normal weight. He is not ill-appearing, toxic-appearing or diaphoretic.  Eyes:     General:        Right eye: No discharge.        Left eye: No discharge.  Cardiovascular:     Rate and Rhythm: Normal rate and regular rhythm.     Pulses: Normal pulses.     Heart sounds: Normal heart sounds.  Pulmonary:     Effort: Pulmonary effort is normal.     Breath sounds: Normal breath sounds. No wheezing or rhonchi.  Abdominal:     General: Bowel sounds are normal.  Musculoskeletal:        General: Normal range of motion.     Cervical back: Normal range of motion.  Skin:    General: Skin is warm and dry.  Neurological:     Mental Status: He is alert and oriented to person, place, and time. Mental status is at baseline.  Psychiatric:        Mood and Affect: Mood normal.        Behavior: Behavior normal.        Thought Content: Thought content normal.        Judgment: Judgment normal.     ASSESSMENT/PLAN:  Mood disorder (HCC) Assessment & Plan: Chronic. Stable.  Tolerating medication  well.  Tried weaning to twice daily but more relief with 3 times daily dosing.  Xanax in future and agreeable to plan -Continue Alprazolam 0.5 mg TID prn x 90 tabs.  3 refills   Orders: -     ALPRAZolam; Take 1 tablet (0.5 mg total) by mouth 3 (three) times daily as needed. for anxiety  Dispense: 90 tablet; Refill: 0 -     ALPRAZolam; Take 1 tablet (0.5 mg total) by mouth 3 (three) times daily as needed. for anxiety  Dispense: 90 tablet; Refill: 0 -     ALPRAZolam; Take 1 tablet (0.5 mg total) by mouth 3 (three) times daily as needed. for anxiety  Dispense: 90 tablet; Refill: 0  Aortic atherosclerosis (HCC) Assessment & Plan: Chronic.  Stable. Tolerating statin and Zetia.  No myalgias. Continue atorvastatin 80 mg daily Continue Zetia 10 mg daily   Stage 3a chronic kidney disease (HCC) Assessment & Plan: Chronic.  Stable.  Recent GFR 59.    Chronic systolic CHF (congestive heart failure) (HCC) Assessment & Plan: HFrEF with ICD.  Follows with Cardiology.  Entresto recently decreased.  Euvolemic on exam. -Follow up with Cards as scheduled   Screening for lung cancer -     Ambulatory Referral for Lung Cancer Scre  Essential hypertension Assessment & Plan: Chronic.  Stable.  Well controlled.    Compliant with current medications -Continue Entresto -Continue Spironolactone 25 mg daily -Continue Carvedilol 3.125 mg BID -Continue to follow up with Cardiology     PDMP reviewed  Return if symptoms worsen or fail to improve.  Carollee Leitz, MD

## 2022-08-30 ENCOUNTER — Encounter: Payer: Self-pay | Admitting: Family Medicine

## 2022-08-30 DIAGNOSIS — Z122 Encounter for screening for malignant neoplasm of respiratory organs: Secondary | ICD-10-CM | POA: Insufficient documentation

## 2022-08-30 DIAGNOSIS — F39 Unspecified mood [affective] disorder: Secondary | ICD-10-CM | POA: Insufficient documentation

## 2022-08-30 NOTE — Assessment & Plan Note (Signed)
Chronic.  Stable.  Recent GFR 59.

## 2022-08-30 NOTE — Assessment & Plan Note (Signed)
Chronic.  Stable. Tolerating statin and Zetia.  No myalgias. Continue atorvastatin 80 mg daily Continue Zetia 10 mg daily

## 2022-08-30 NOTE — Assessment & Plan Note (Signed)
HFrEF with ICD.  Follows with Cardiology.  Entresto recently decreased.  Euvolemic on exam. -Follow up with Cards as scheduled

## 2022-08-30 NOTE — Assessment & Plan Note (Signed)
Chronic.  Stable.  Well controlled.    Compliant with current medications -Continue Entresto -Continue Spironolactone 25 mg daily -Continue Carvedilol 3.125 mg BID -Continue to follow up with Cardiology

## 2022-08-30 NOTE — Assessment & Plan Note (Signed)
Chronic. Stable.  Tolerating medication well.  Tried weaning to twice daily but more relief with 3 times daily dosing.  Xanax in future and agreeable to plan -Continue Alprazolam 0.5 mg TID prn x 90 tabs.  3 refills

## 2022-09-05 ENCOUNTER — Telehealth: Payer: Self-pay | Admitting: Family

## 2022-09-05 NOTE — Telephone Encounter (Signed)
Patient assistance for farxiga has been approved til the end of the year and patient has been receiving shippment.   Lorrena Goranson, NT

## 2022-09-06 ENCOUNTER — Telehealth: Payer: Self-pay

## 2022-09-06 NOTE — Telephone Encounter (Signed)
Received a fax from [facility] stating: Shipment was sent via [facility] [id number] on 09/05/2022 12:00 AM with tracking number:  [id number]. Shipment was sent to the patient's home address.

## 2022-09-10 ENCOUNTER — Ambulatory Visit: Payer: Medicare Other

## 2022-09-10 DIAGNOSIS — I255 Ischemic cardiomyopathy: Secondary | ICD-10-CM

## 2022-09-10 LAB — CUP PACEART REMOTE DEVICE CHECK
Battery Remaining Longevity: 30 mo
Battery Remaining Percentage: 30 %
Brady Statistic RV Percent Paced: 0 %
Date Time Interrogation Session: 20240213022200
HighPow Impedance: 90 Ohm
Implantable Lead Connection Status: 753985
Implantable Lead Implant Date: 20130503
Implantable Lead Location: 753860
Implantable Lead Model: 292
Implantable Lead Serial Number: 112568
Implantable Pulse Generator Implant Date: 20130503
Lead Channel Impedance Value: 642 Ohm
Lead Channel Pacing Threshold Amplitude: 0.5 V
Lead Channel Pacing Threshold Pulse Width: 0.5 ms
Lead Channel Setting Pacing Amplitude: 2.4 V
Lead Channel Setting Pacing Pulse Width: 0.5 ms
Lead Channel Setting Sensing Sensitivity: 0.6 mV
Pulse Gen Serial Number: 105232
Zone Setting Status: 755011

## 2022-09-11 ENCOUNTER — Ambulatory Visit: Payer: Medicare Other | Admitting: Urology

## 2022-09-17 DIAGNOSIS — Z7982 Long term (current) use of aspirin: Secondary | ICD-10-CM | POA: Diagnosis not present

## 2022-09-17 DIAGNOSIS — Z87891 Personal history of nicotine dependence: Secondary | ICD-10-CM | POA: Diagnosis not present

## 2022-09-17 DIAGNOSIS — I5032 Chronic diastolic (congestive) heart failure: Secondary | ICD-10-CM | POA: Diagnosis not present

## 2022-09-17 DIAGNOSIS — R9431 Abnormal electrocardiogram [ECG] [EKG]: Secondary | ICD-10-CM | POA: Diagnosis not present

## 2022-09-17 DIAGNOSIS — I6529 Occlusion and stenosis of unspecified carotid artery: Secondary | ICD-10-CM | POA: Diagnosis not present

## 2022-09-17 DIAGNOSIS — I2510 Atherosclerotic heart disease of native coronary artery without angina pectoris: Secondary | ICD-10-CM | POA: Diagnosis not present

## 2022-09-17 DIAGNOSIS — Z955 Presence of coronary angioplasty implant and graft: Secondary | ICD-10-CM | POA: Diagnosis not present

## 2022-09-17 DIAGNOSIS — D170 Benign lipomatous neoplasm of skin and subcutaneous tissue of head, face and neck: Secondary | ICD-10-CM | POA: Diagnosis not present

## 2022-09-17 DIAGNOSIS — I6523 Occlusion and stenosis of bilateral carotid arteries: Secondary | ICD-10-CM | POA: Diagnosis not present

## 2022-09-17 DIAGNOSIS — R0789 Other chest pain: Secondary | ICD-10-CM | POA: Diagnosis not present

## 2022-09-17 DIAGNOSIS — Z7902 Long term (current) use of antithrombotics/antiplatelets: Secondary | ICD-10-CM | POA: Diagnosis not present

## 2022-09-17 DIAGNOSIS — M542 Cervicalgia: Secondary | ICD-10-CM | POA: Diagnosis not present

## 2022-09-17 DIAGNOSIS — Z79899 Other long term (current) drug therapy: Secondary | ICD-10-CM | POA: Diagnosis not present

## 2022-09-17 DIAGNOSIS — I491 Atrial premature depolarization: Secondary | ICD-10-CM | POA: Diagnosis not present

## 2022-09-17 DIAGNOSIS — I110 Hypertensive heart disease with heart failure: Secondary | ICD-10-CM | POA: Diagnosis not present

## 2022-09-17 DIAGNOSIS — K029 Dental caries, unspecified: Secondary | ICD-10-CM | POA: Diagnosis not present

## 2022-09-17 DIAGNOSIS — I6522 Occlusion and stenosis of left carotid artery: Secondary | ICD-10-CM | POA: Diagnosis not present

## 2022-09-17 DIAGNOSIS — I444 Left anterior fascicular block: Secondary | ICD-10-CM | POA: Diagnosis not present

## 2022-09-18 ENCOUNTER — Ambulatory Visit: Payer: Medicare Other | Admitting: Urology

## 2022-09-18 DIAGNOSIS — I6522 Occlusion and stenosis of left carotid artery: Secondary | ICD-10-CM | POA: Diagnosis not present

## 2022-09-18 DIAGNOSIS — I110 Hypertensive heart disease with heart failure: Secondary | ICD-10-CM | POA: Diagnosis not present

## 2022-09-18 DIAGNOSIS — R9431 Abnormal electrocardiogram [ECG] [EKG]: Secondary | ICD-10-CM | POA: Diagnosis not present

## 2022-09-18 DIAGNOSIS — I444 Left anterior fascicular block: Secondary | ICD-10-CM | POA: Diagnosis not present

## 2022-09-18 DIAGNOSIS — M542 Cervicalgia: Secondary | ICD-10-CM | POA: Diagnosis not present

## 2022-09-18 DIAGNOSIS — K029 Dental caries, unspecified: Secondary | ICD-10-CM | POA: Diagnosis not present

## 2022-09-18 DIAGNOSIS — R0789 Other chest pain: Secondary | ICD-10-CM | POA: Diagnosis not present

## 2022-09-18 DIAGNOSIS — I2510 Atherosclerotic heart disease of native coronary artery without angina pectoris: Secondary | ICD-10-CM | POA: Diagnosis not present

## 2022-09-18 DIAGNOSIS — I491 Atrial premature depolarization: Secondary | ICD-10-CM | POA: Diagnosis not present

## 2022-09-18 DIAGNOSIS — I6523 Occlusion and stenosis of bilateral carotid arteries: Secondary | ICD-10-CM | POA: Diagnosis not present

## 2022-09-18 DIAGNOSIS — I6529 Occlusion and stenosis of unspecified carotid artery: Secondary | ICD-10-CM | POA: Diagnosis not present

## 2022-09-18 DIAGNOSIS — R221 Localized swelling, mass and lump, neck: Secondary | ICD-10-CM | POA: Diagnosis not present

## 2022-09-18 DIAGNOSIS — I5032 Chronic diastolic (congestive) heart failure: Secondary | ICD-10-CM | POA: Diagnosis not present

## 2022-09-18 DIAGNOSIS — I10 Essential (primary) hypertension: Secondary | ICD-10-CM | POA: Diagnosis not present

## 2022-09-18 LAB — CBC AND DIFFERENTIAL
HCT: 46 (ref 41–53)
Hemoglobin: 15.4 (ref 13.5–17.5)
Platelets: 176 10*3/uL (ref 150–400)
WBC: 7.9

## 2022-09-18 LAB — HEMOGLOBIN A1C: Hemoglobin A1C: 5.7

## 2022-09-18 LAB — LIPID PANEL
Cholesterol: 157 (ref 0–200)
HDL: 38 (ref 35–70)
LDL Cholesterol: 90
Triglycerides: 145 (ref 40–160)

## 2022-09-18 LAB — BASIC METABOLIC PANEL
BUN: 27 — AB (ref 4–21)
CO2: 22 (ref 13–22)
Chloride: 22 — AB (ref 99–108)
Creatinine: 1.6 — AB (ref 0.6–1.3)
Glucose: 105
Potassium: 3.8 mEq/L (ref 3.5–5.1)
Sodium: 133 — AB (ref 137–147)

## 2022-09-19 ENCOUNTER — Telehealth: Payer: Self-pay | Admitting: Cardiovascular Disease

## 2022-09-19 ENCOUNTER — Telehealth: Payer: Self-pay | Admitting: Family Medicine

## 2022-09-19 DIAGNOSIS — M542 Cervicalgia: Secondary | ICD-10-CM | POA: Diagnosis not present

## 2022-09-19 DIAGNOSIS — I6522 Occlusion and stenosis of left carotid artery: Secondary | ICD-10-CM | POA: Diagnosis not present

## 2022-09-19 DIAGNOSIS — I10 Essential (primary) hypertension: Secondary | ICD-10-CM | POA: Diagnosis not present

## 2022-09-19 DIAGNOSIS — I2510 Atherosclerotic heart disease of native coronary artery without angina pectoris: Secondary | ICD-10-CM | POA: Diagnosis not present

## 2022-09-19 DIAGNOSIS — I5032 Chronic diastolic (congestive) heart failure: Secondary | ICD-10-CM | POA: Diagnosis not present

## 2022-09-19 DIAGNOSIS — I110 Hypertensive heart disease with heart failure: Secondary | ICD-10-CM | POA: Diagnosis not present

## 2022-09-19 NOTE — Telephone Encounter (Signed)
Daughter called in with concerns with patient. States that patient is in the hospital but she doesn't feel like he is getting the proper care. Would like to speak the the dr or for the dr to call patient. Please advise

## 2022-09-19 NOTE — Telephone Encounter (Signed)
Spoke with patient and he is currently admitted at a different hospital. He mentioned some testing and that they will not do anything about it and reported his provider would need to put in referrals. Recommended that when he is discharged to get copies of reports and any imaging that has been done for our provider to review. We reviewed appointment date and time and that we could add him onto a wait list if anything opens up. He was appreciative for the call back with no further questions at this time.

## 2022-09-19 NOTE — Telephone Encounter (Signed)
Patient is scheduled with Dr. Derrel Nip on 09/23/2022.  ng

## 2022-09-19 NOTE — Telephone Encounter (Signed)
Patient is being release from the hospital today and is requesting a hospital follow up. At time of call there was not openings.

## 2022-09-23 ENCOUNTER — Encounter: Payer: Self-pay | Admitting: Internal Medicine

## 2022-09-23 ENCOUNTER — Ambulatory Visit (INDEPENDENT_AMBULATORY_CARE_PROVIDER_SITE_OTHER): Payer: Medicare Other | Admitting: Internal Medicine

## 2022-09-23 VITALS — BP 126/78 | HR 67 | Temp 97.4°F | Ht 68.0 in | Wt 200.0 lb

## 2022-09-23 DIAGNOSIS — D170 Benign lipomatous neoplasm of skin and subcutaneous tissue of head, face and neck: Secondary | ICD-10-CM | POA: Diagnosis not present

## 2022-09-23 DIAGNOSIS — Z09 Encounter for follow-up examination after completed treatment for conditions other than malignant neoplasm: Secondary | ICD-10-CM

## 2022-09-23 DIAGNOSIS — M25812 Other specified joint disorders, left shoulder: Secondary | ICD-10-CM | POA: Diagnosis not present

## 2022-09-23 DIAGNOSIS — G8929 Other chronic pain: Secondary | ICD-10-CM

## 2022-09-23 DIAGNOSIS — M25512 Pain in left shoulder: Secondary | ICD-10-CM | POA: Diagnosis not present

## 2022-09-23 DIAGNOSIS — I255 Ischemic cardiomyopathy: Secondary | ICD-10-CM | POA: Diagnosis not present

## 2022-09-23 DIAGNOSIS — I6521 Occlusion and stenosis of right carotid artery: Secondary | ICD-10-CM | POA: Diagnosis not present

## 2022-09-23 MED ORDER — TIZANIDINE HCL 4 MG PO TABS: 4.0000 mg | ORAL_TABLET | Freq: Four times a day (QID) | ORAL | 0 refills | Status: DC | PRN

## 2022-09-23 NOTE — Progress Notes (Signed)
Subjective:  Patient ID: Ian Mills, male    DOB: 02-Aug-1955  Age: 67 y.o. MRN: [id number]  CC: The primary encounter diagnosis was Carotid atherosclerosis, right. Diagnoses of Lipoma of skin and subcutaneous tissue of neck, Chronic left shoulder pain, Impingement of left shoulder, Ischemic cardiomyopathy, and Hospital discharge follow-up were also pertinent to this visit.   HPI Ian Mills presents for  Chief Complaint  Patient presents with   Hospitalization [facility] follow up for left side neck and shoulder pain. Current pain level is a 6.    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 Cardiac enzymes and EKG unchanged,  CT angiogram negative for PE< but noted critical right  ICA stenosis (but hospitalist dc summary states LEFT ICA stenosis) and He was admitted for chest pain :  Cardiolite: nor reversible deficits,  ECHO : severe systolic dysfunction EF 0000000  (EF was 25 to 30% on Dec 2022 ECHO by his cardiologist Dr Rockey Situ) . Additionally , a left supraclavicular lipoma was found on ultrasound.   His neck pain was not treated, and he was advised to seek ENT/vascular surgery consults at follow up(not available at [facility])  Patient continues to report pain in left supraclavicular region rated at 6/10.  Has chronic  left shoulder pain  with prior  orthopedic evaluations.  Has received I/A injections in the past  States that he had been using ibuprofen at home. Prior to admission.        Outpatient Medications Prior to Visit  Medication Sig Dispense Refill   albuterol (VENTOLIN HFA)  108 (90 Base) MCG/ACT inhaler Inhale 1-2 puffs into the lungs every 6 (six) hours as needed for wheezing or shortness of breath. 18 g 11   ALPRAZolam (XANAX) 0.5 MG tablet Take 1 tablet (0.5 mg total) by mouth 3 (three) times daily as needed. for anxiety 90 tablet 0   [START ON 10/10/2022] ALPRAZolam (XANAX) 0.5 MG tablet Take 1 tablet (0.5 mg total) by mouth 3 (three) times daily as needed. for anxiety 90 tablet 0   [START ON 11/10/2022] ALPRAZolam (XANAX) 0.5 MG tablet Take 1 tablet (0.5 mg total) by mouth 3 (three) times daily as needed. for anxiety 90 tablet 0   amiodarone (PACERONE) 200 MG tablet Take 1 tablet (200 mg) by mouth 5 days a week 90 tablet 0   aspirin EC 81 MG tablet Take 81 mg by mouth daily. Swallow whole.     atorvastatin (LIPITOR) 80 MG tablet Take 1 tablet (80 mg total) by mouth daily. 90 tablet 3   carvedilol (COREG) 3.125 MG tablet Take 1 tablet (3.125 mg total) by mouth 2 (two) times daily. 180 tablet 3   clopidogrel (PLAVIX) 75 MG tablet Take 1 tablet (75 mg total) by mouth daily. 90 tablet 3   dapagliflozin propanediol (FARXIGA) 10 MG TABS tablet Take 1 tablet (10 mg total) by mouth daily before breakfast. 90 tablet 3   ezetimibe (ZETIA) 10 MG tablet Take 1 tablet (10 mg total) by mouth daily. 90 tablet 3  ipratropium-albuterol (DUONEB) 0.5-2.5 (3) MG/3ML SOLN Take 3 mLs by nebulization every 6 (six) hours as needed. 360 mL 11   potassium chloride (KLOR-CON 10) 10 MEQ tablet Take 1 tablet (10 mEq total) by mouth daily. 90 tablet 3   sacubitril-valsartan (ENTRESTO) 49-51 MG Take 1 tablet by mouth 2 (two) times daily. 180 tablet 1   spironolactone (ALDACTONE) 25 MG tablet Take 1 tablet (25 mg total) by mouth daily. 90 tablet 3   tadalafil (CIALIS) 10 MG tablet Take 1-2 tablets (10-20 mg total) by mouth daily as needed for erectile dysfunction. 30 tablet 11   Tiotropium Bromide-Olodaterol (STIOLTO RESPIMAT) 2.5-2.5 MCG/ACT AERS Inhale 2 puffs into the lungs daily. 4 g 11    torsemide (DEMADEX) 20 MG tablet Take 1 tablet (20 mg) by mouth on M, W, F.  You may take 1 extra tablet (20 mg) by mouth once daily as needed for swelling/ weight gain/ shortness of breath 135 tablet 5   No facility-administered medications prior to visit.    Review of Systems;  Patient denies headache, fevers, malaise, unintentional weight loss, skin rash, eye pain, sinus congestion and sinus pain, sore throat, dysphagia,  hemoptysis , cough, dyspnea, wheezing, , palpitations, orthopnea, edema, abdominal pain, nausea, melena, diarrhea, constipation, flank pain, dysuria, hematuria, urinary  Frequency, nocturia, numbness, tingling, seizures,  Focal weakness, Loss of consciousness,  Tremor, insomnia, depression, anxiety, and suicidal ideation.      Objective:  BP 126/78   Pulse 67   Temp (!) 97.4 F (36.3 C) (Oral)   Ht '5\' 8"'$  (1.727 m)   Wt 200 lb (90.7 kg)   SpO2 98%   BMI 30.41 kg/m   BP Readings from Last 3 Encounters:  09/23/22 126/78  08/26/22 110/70  08/15/22 120/70    Wt Readings from Last 3 Encounters:  09/23/22 200 lb (90.7 kg)  08/26/22 196 lb 8 oz (89.1 kg)  08/15/22 196 lb 12 oz (89.2 kg)    Physical Exam Vitals reviewed.  Constitutional:      General: He is not in acute distress.    Appearance: Normal appearance. He is normal weight. He is not ill-appearing, toxic-appearing or diaphoretic.  HENT:     Head: Normocephalic.  Eyes:     General: No scleral icterus.       Right eye: No discharge.        Left eye: No discharge.     Conjunctiva/sclera: Conjunctivae normal.  Cardiovascular:     Rate and Rhythm: Normal rate and regular rhythm.     Heart sounds: Normal heart sounds.  Pulmonary:     Effort: Pulmonary effort is normal. No respiratory distress.     Breath sounds: Normal breath sounds.  Musculoskeletal:        General: Normal range of motion.     Cervical back: Normal range of motion.  Skin:    General: Skin is warm and dry.     Findings:  Bruising and ecchymosis present.       Neurological:     General: No focal deficit present.     Mental Status: He is alert and oriented to person, place, and time. Mental status is at baseline.  Psychiatric:        Mood and Affect: Mood normal.        Behavior: Behavior normal.        Thought Content: Thought content normal.        Judgment: Judgment normal.     Lab Results  Component  Value Date   HGBA1C 5.7 09/18/2022   HGBA1C 5.6 04/18/2021   HGBA1C 5.4 03/03/2020    Lab Results  Component Value Date   CREATININE 1.6 (A) 09/18/2022   CREATININE 1.08 08/07/2022   CREATININE 1.11 06/25/2022    Lab Results  Component Value Date   WBC 7.9 09/18/2022   HGB 15.4 09/18/2022   HCT 46 09/18/2022   PLT 176 09/18/2022   GLUCOSE 106 (H) 08/07/2022   CHOL 157 09/18/2022   TRIG 145 09/18/2022   HDL 38 09/18/2022   LDLCALC 90 09/18/2022   ALT 24 06/25/2022   AST 18 06/25/2022   NA 133 (A) 09/18/2022   K 3.8 09/18/2022   CL 22 (A) 09/18/2022   CREATININE 1.6 (A) 09/18/2022   BUN 27 (A) 09/18/2022   CO2 22 09/18/2022   TSH 1.834 06/25/2022   PSA 0.35 06/09/2020   INR 0.97 03/06/2015   HGBA1C 5.7 09/18/2022   MICROALBUR 0.8 03/03/2020    DG Chest 2 View  Result Date: 08/07/2022 CLINICAL DATA:  Short of breath for the past week.  Cough. EXAM: CHEST - 2 VIEW COMPARISON:  07/29/2021. FINDINGS: Previous median sternotomy. Cardiac silhouette normal in size and configuration. No mediastinal or hilar masses. No evidence of adenopathy. Stable left anterior chest wall AICD. Clear lungs.  No pleural effusion or pneumothorax. Skeletal structures are intact. IMPRESSION: No active cardiopulmonary disease. Electronically Signed   By: Lajean Manes M.D.   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  Ct neck noted no  cervical lymphadenopathy  Orders: -     Ambulatory referral to ENT  Chronic left shoulder pain -     Ambulatory referral to Orthopedic Surgery  Impingement of left shoulder Assessment & Plan: Pain started last summer after capng with grandon, but increased in intensity and he attributed the pain to the lipoma on his neck, so he went to [facility].  He may not be a candidate for surgery given his ischemic cardiomyopathy (er 20 to 25%) but will send to shoulder  specialist at [facility]. Tylenol and tizanidine prescribed .  He was reminded to avoid NSAIDS  given his CKD and use of Plavix s/p PTCA w/ multiple stent placement history    Ischemic cardiomyopathy Assessment & Plan: His Feb 21  admission for chest and neck/shoulder pain included a Cardiolote (negative for reversible changes) and ECHO   ( EF  20 to 25 %) with global hypokinesis )    Hospital discharge follow-up Assessment & Plan: Patient is stable post discharge and has no new issues or questions about discharge plans at the visit today for hospital follow up.  I have reviewed the records from the hospital admission in detail with patient today.   All labs have been abstracted into chart .  I have  made referrals to vascular surgery for the critical carotid stenosis,  ENT for the lipoma removal,  and orthopedics ([facility]) for the chronic shoulder pain   Other orders -     tiZANidine HCl; Take 1 tablet (4 mg total) by mouth every 6 (six) hours as needed for muscle spasms.  Dispense: 30 tablet; Refill: 0     I provided 30 minutes of face-to-face time during this encounter reviewing patient's last visit with me, patient's  most recent visit with  cardiology,  nephrology,  and neurology,  recent surgical and non surgical procedures, previous  labs and imaging studies, counseling on currently addressed issues,  and post visit ordering to diagnostics and therapeutics .   Follow-up: Return in about 3 months (around  12/22/2022).   Crecencio Mc, MD

## 2022-09-23 NOTE — Patient Instructions (Addendum)
I'm sorry that your recent hospitalization did not address your pain issues.   I think  Your left shoulder pain is likely from multiple  causes:    1) your shoulder joint:  2) you may be having a  muscle spasm of the trapezius muscle    3) The lipoma (fatty tumor) is probably the least likely to be causing the shoulder pain because it is superficial and would not be pressing on any nerves   Please take tylenol 1000 mg every 12 hours  and I will add a muscle relaxer  called tizanidine .  Let me know if this does not improve your pain level   Take the muscle relaxer in the evening because it may make you sleepy   3) I will make the referral to [facility]  to address removal of  the left sided lipoma   4) I will make the referral to [facility] to address the stenosis of the Right Internal carotid artery   5)  I will refer you to Dr Roland Rack at [facility].  He specializes in shoulders

## 2022-09-23 NOTE — Assessment & Plan Note (Addendum)
Patient is stable post discharge and has no new issues or questions about discharge plans at the visit today for hospital follow up.  I have reviewed the records from the hospital admission in detail with patient today.   All labs have been abstracted into chart .  I have  made referrals to vascular surgery for the critical carotid stenosis,  ENT for the lipoma removal,  and orthopedics ([facility]) for the chronic shoulder pain

## 2022-09-23 NOTE — Assessment & Plan Note (Signed)
His Feb 21  admission for chest and neck/shoulder pain included a Cardiolote (negative for reversible changes) and ECHO   ( EF  20 to 25 %) with global hypokinesis )

## 2022-09-23 NOTE — Assessment & Plan Note (Signed)
Noted on ultrasound.  Ct neck noted no cervical lymphadenopathy

## 2022-09-23 NOTE — Assessment & Plan Note (Addendum)
Pain started last summer after capng with grandon, but increased in intensity and he attributed the pain to the lipoma on his neck, so he went to [facility].  He may not be a candidate for surgery given his ischemic cardiomyopathy (er 20 to 25%) but will send to shoulder  specialist at [facility]. Tylenol and tizanidine prescribed .  He was reminded to avoid NSAIDS  given his CKD and use of Plavix s/p PTCA w/ multiple stent placement history

## 2022-10-03 ENCOUNTER — Other Ambulatory Visit (INDEPENDENT_AMBULATORY_CARE_PROVIDER_SITE_OTHER): Payer: Self-pay | Admitting: Vascular Surgery

## 2022-10-03 DIAGNOSIS — I6521 Occlusion and stenosis of right carotid artery: Secondary | ICD-10-CM

## 2022-10-08 ENCOUNTER — Encounter (INDEPENDENT_AMBULATORY_CARE_PROVIDER_SITE_OTHER): Payer: Self-pay | Admitting: Vascular Surgery

## 2022-10-08 ENCOUNTER — Ambulatory Visit (INDEPENDENT_AMBULATORY_CARE_PROVIDER_SITE_OTHER): Payer: Medicare Other

## 2022-10-08 ENCOUNTER — Ambulatory Visit (INDEPENDENT_AMBULATORY_CARE_PROVIDER_SITE_OTHER): Payer: Medicare Other | Admitting: Vascular Surgery

## 2022-10-08 VITALS — BP 151/87 | HR 58 | Resp 18 | Ht 69.0 in | Wt 199.0 lb

## 2022-10-08 DIAGNOSIS — N1831 Chronic kidney disease, stage 3a: Secondary | ICD-10-CM | POA: Diagnosis not present

## 2022-10-08 DIAGNOSIS — I10 Essential (primary) hypertension: Secondary | ICD-10-CM | POA: Diagnosis not present

## 2022-10-08 DIAGNOSIS — I6521 Occlusion and stenosis of right carotid artery: Secondary | ICD-10-CM

## 2022-10-08 DIAGNOSIS — I6523 Occlusion and stenosis of bilateral carotid arteries: Secondary | ICD-10-CM

## 2022-10-08 DIAGNOSIS — D170 Benign lipomatous neoplasm of skin and subcutaneous tissue of head, face and neck: Secondary | ICD-10-CM

## 2022-10-08 DIAGNOSIS — E782 Mixed hyperlipidemia: Secondary | ICD-10-CM

## 2022-10-08 NOTE — Assessment & Plan Note (Signed)
blood pressure control important in reducing the progression of atherosclerotic disease. On appropriate oral medications.  

## 2022-10-08 NOTE — Assessment & Plan Note (Signed)
Has been referred to ENT as well.

## 2022-10-08 NOTE — Assessment & Plan Note (Signed)
Carotid duplex showed heterogeneous plaque bilaterally with velocities that would fall into the upper end of the 1 to 39% range. Already on DAPT and a statin agent. No role for intervention.  Recheck with duplex in 6 months.

## 2022-10-08 NOTE — Assessment & Plan Note (Signed)
Follow with duplex and avoid contrast if possible.

## 2022-10-08 NOTE — Progress Notes (Signed)
Patient ID: Ian Mills, male   DOB: 1956-02-12, 67 y.o.   MRN: [id number]  Chief Complaint  Patient presents with   New Patient (Initial Visit)    np carotid +consult; Nonrheumatic aortic valve stenosis    HPI Ian Mills is a 67 y.o. male.  I am asked to see the patient by Dr. Derrel Nip for evaluation of carotid artery stenosis.  The patient has a longstanding history of heart disease with multiple previous cardiac interventions and has been having left neck and shoulder pain.  As part of her workup at an outside center, some degree of carotid disease was identified.  Patient does not have a recent history of stroke or TIA symptoms. Specifically, the patient denies amaurosis fugax, speech or swallowing difficulties, or arm or leg weakness or numbness.  To a further evaluate his carotid disease, duplex was performed today.  Carotid duplex showed heterogeneous plaque bilaterally with velocities that would fall into the upper end of the 1 to 39% range.   Past Medical History:  Diagnosis Date   Acute anterior wall MI ([place]) 07/04/1991   a.) LHC: EF 29%; 100% mLAD -> PTCA performed   Acute cough 08/16/2022   Anxiety    Aortic atherosclerosis ([place])    Baker's cyst 06/10/2019   5.3 cm 01/2018 noted [place] no repair as of 06/10/2019      Bilateral impacted cerumen 06/10/2019   Cellulitis of left foot 05/10/2022   CHF (congestive heart failure) (HCC)    COPD (chronic obstructive pulmonary disease) ([place])    Coronary artery disease    a.) 3 stents --> 3x18 mm Duet to OM3, 2.5x18 mm Duet to D1, 2.5x13 mm Duet to RPDA. b. 06/2013 PCI: CTO LAD, patent LAD stent, RCA 70p (FFR 0.74--> DES), 31m(DES), RPDA 50%, EF 20%; c. 08/2019 Cath: LM nl, LAD 100p/m, D1 60, D2 patent stent, LCX 60ost/p, 421mSR, RCA 30p, 2056mR.   Dizziness 03/30/2015   Family history of lung cancer    HFrEF (heart failure with reduced ejection fraction) ([place]  a. 05/2014 Echo: EF 25-30%; b. 01/2018 Echo ([facility]): EF 30%; c.  06/2021 Echo: EF 25-30%, apical AK, GrI DD, nl RV fxn.   History of prostate surgery 06/09/2020   History of ventricular tachycardia 01/23/2012   HLD (hyperlipidemia)    HTN (hypertension)    ICD  single,BSX    a. DOI 11/2011; b. S/N# [id number] 06/01/2014   Ischemic cardiomyopathy    a. 05/2014 Echo: EF 25-30%; b. 01/2018 Echo ([facility]): EF 30%, c. 06/2021 Echo: EF 25-30%.   Lateral wall myocardial infarction ([facility]2/08/1996   a.) LHC: EF 44%; 75% RPDA and 95% OM3; PCI performed and a 3.0 x 18 mm Duet stent placed to OM3   Leukocytosis 08/16/2022   Long term current use of antithrombotics/antiplatelets    a.) DAPT therapy (ASA + clopidogrel)   NSTEMI (non-ST elevated myocardial infarction) ([place]5/07/2011   a.) LHC: 100% mLAD; intervention deferred opting for medical management.   NSTEMI (non-ST elevated myocardial infarction) ([place]CC[place]1/26/2017   Personal history of colonic polyps    Pneumonia 08/2018   Sacroiliac pain 07/31/2016   Formatting of this note might be different from the original. Added automatically from request for surgery [id number] ([place]1/07/2021   Sepsis due to group A Streptococcus with acute renal failure and septic shock (HCC)    Syncope and collapse    Syncope and collapse 09/26/2010  Qualifier: Diagnosis of   By: Rockey Situ MD, Tim       Tobacco abuse 06/13/2016   URI (upper respiratory infection) 03/17/2022   Ventricular tachycardia ([place]) 12/01/2011   a.) VT arrest; ROSC achieved --[place] placed.    Past Surgical History:  Procedure Laterality Date   APPENDECTOMY N/A    CARDIAC DEFIBRILLATOR PLACEMENT N/A 12/01/2011   COLONOSCOPY WITH PROPOFOL N/A 12/22/2020   Procedure: COLONOSCOPY WITH PROPOFOL;  Surgeon: Jonathon Bellows, MD;  Location: [facility] ENDOSCOPY;  Service: Gastroenterology;  Laterality: N/A;   CORONARY ANGIOPLASTY Left 07/05/1991   Procedure: CORONARY ANGIOPLASTY; Location: [facility]; Surgeon: Doristine Bosworth, MD   CORONARY  ANGIOPLASTY Left 08/06/1991   Procedure: CORONARY ANGIOPLASTY; Location: [facility]; Surgeon: Ellouise Newer, MD   CORONARY ANGIOPLASTY Left 08/09/1991   Procedure: CORONARY ANGIOPLASTY; Location: [facility]; Surgeon: Ellouise Newer, MD   CORONARY ANGIOPLASTY Left 10/28/1991   Procedure: CORONARY ANGIOPLASTY; Location: [facility]; Surgeon: Ellouise Newer, MD   CORONARY ANGIOPLASTY Left 11/02/1991   Procedure: CORONARY ANGIOPLASTY; Location: [facility]; Surgeon: Ellouise Newer, MD   CORONARY ANGIOPLASTY Left 01/10/1992   Procedure: CORONARY ANGIOPLASTY; Location: [facility]; Surgeon: Carles Collet, MD   CORONARY ANGIOPLASTY Left 09/26/1993   Procedure: CORONARY ANGIOPLASTY; Location: [facility]; Surgeon: Melton Alar, MD   CORONARY ANGIOPLASTY Left 09/11/2006   Procedure: CORONARY ANGIOPLASTY; Location: [facility]   CORONARY ANGIOPLASTY Left 01/17/2010   Procedure: CORONARY ANGIOPLASTY; Location: [facility]; Surgeon: Wendee Beavers, MD   CORONARY ANGIOPLASTY Left 11/27/2011   Procedure: CORONARY ANGIOPLASTY; Location: [facility]; Surgeon: Derinda Sis, MD   CORONARY ANGIOPLASTY WITH STENT PLACEMENT Left 06/29/1997   Procedure: CORONARY ANGIOPLASTY WITH STENT PLACEMENT (3.0 x 18 mm Diet stent to OM3); Location: [facility]; Surgeon: Allie Bossier, MD   CORONARY ANGIOPLASTY WITH STENT PLACEMENT Left 07/12/1998   Procedure: CORONARY ANGIOPLASTY WITH STENT PLACEMENT (2.8 x 8 mm Duet stent to D1, 2.5 x 13 mm Duet stent to RPDA); Location: [facility]   GRAFT APPLICATION Left [id number]   Procedure: GRAFT APPLICATION;  Surgeon: Benjamine Sprague, DO;  Location: [facility] ORS;  Service: General;  Laterality: Left;   LEFT HEART CATH AND CORONARY ANGIOGRAPHY Left 08/30/2019   Procedure: LEFT HEART CATH AND CORONARY ANGIOGRAPHY;  Surgeon: Wellington Hampshire, MD;  Location: [facility] LAB;  Service: Cardiovascular;  Laterality: Left;   PROSTATE SURGERY     urethra grew around prostate    TUMOR REMOVAL  2001   chest thymic cyst; behind lungs [facility] benign      Family History   Problem Relation Age of Onset   Cancer - Lung Mother    Hypertension Mother    Heart attack Mother    Cancer Father        larynx   Dementia Maternal Grandmother    Heart attack Paternal Grandfather    Colon cancer Neg Hx    Stomach cancer Neg Hx    Pancreatic cancer Neg Hx       Social History   Tobacco Use   Smoking status: Former    Packs/day: 0.50    Years: 42.00    Total pack years: 21.00    Types: Cigarettes    Quit date: 11/10/2019    Years since quitting: 2.9   Smokeless tobacco: Never  Vaping Use   Vaping Use: Never used  Substance Use Topics   Alcohol use: Yes    Alcohol/week: 4.0 standard drinks of alcohol    Types: 4 Cans of beer per week    Comment: per week   Drug use: No  Allergies  Allergen Reactions   Nitroglycerin Nausea Only and Other (See Comments)    Per patient "causes severe  headache"    Current Outpatient Medications  Medication Sig Dispense Refill   albuterol (VENTOLIN HFA) 108 (90 Base) MCG/ACT inhaler Inhale 1-2 puffs into the lungs every 6 (six) hours as needed for wheezing or shortness of breath. 18 g 11   ALPRAZolam (XANAX) 0.5 MG tablet Take 1 tablet (0.5 mg total) by mouth 3 (three) times daily as needed. for anxiety 90 tablet 0   [START ON 10/10/2022] ALPRAZolam (XANAX) 0.5 MG tablet Take 1 tablet (0.5 mg total) by mouth 3 (three) times daily as needed. for anxiety 90 tablet 0   [START ON 11/10/2022] ALPRAZolam (XANAX) 0.5 MG tablet Take 1 tablet (0.5 mg total) by mouth 3 (three) times daily as needed. for anxiety 90 tablet 0   amiodarone (PACERONE) 200 MG tablet Take 1 tablet (200 mg) by mouth 5 days a week 90 tablet 0   aspirin EC 81 MG tablet Take 81 mg by mouth daily. Swallow whole.     atorvastatin (LIPITOR) 80 MG tablet Take 1 tablet (80 mg total) by mouth daily. 90 tablet 3   carvedilol (COREG) 3.125 MG tablet Take 1 tablet (3.125 mg total) by mouth 2 (two) times daily. 180 tablet 3   clopidogrel (PLAVIX) 75 MG tablet  Take 1 tablet (75 mg total) by mouth daily. 90 tablet 3   dapagliflozin propanediol (FARXIGA) 10 MG TABS tablet Take 1 tablet (10 mg total) by mouth daily before breakfast. 90 tablet 3   ezetimibe (ZETIA) 10 MG tablet Take 1 tablet (10 mg total) by mouth daily. 90 tablet 3   potassium chloride (KLOR-CON 10) 10 MEQ tablet Take 1 tablet (10 mEq total) by mouth daily. 90 tablet 3   sacubitril-valsartan (ENTRESTO) 49-51 MG Take 1 tablet by mouth 2 (two) times daily. 180 tablet 1   spironolactone (ALDACTONE) 25 MG tablet Take 1 tablet (25 mg total) by mouth daily. 90 tablet 3   tadalafil (CIALIS) 10 MG tablet Take 1-2 tablets (10-20 mg total) by mouth daily as needed for erectile dysfunction. 30 tablet 11   Tiotropium Bromide-Olodaterol (STIOLTO RESPIMAT) 2.5-2.5 MCG/ACT AERS Inhale 2 puffs into the lungs daily. 4 g 11   tiZANidine (ZANAFLEX) 4 MG tablet Take 1 tablet (4 mg total) by mouth every 6 (six) hours as needed for muscle spasms. 30 tablet 0   torsemide (DEMADEX) 20 MG tablet Take 1 tablet (20 mg) by mouth on M, W, F.  You may take 1 extra tablet (20 mg) by mouth once daily as needed for swelling/ weight gain/ shortness of breath 135 tablet 5   ipratropium-albuterol (DUONEB) 0.5-2.5 (3) MG/3ML SOLN Take 3 mLs by nebulization every 6 (six) hours as needed. (Patient not taking: Reported on 10/08/2022) 360 mL 11   No current facility-administered medications for this visit.      REVIEW OF SYSTEMS (Negative unless checked)  Constitutional: '[]'$ Weight loss  '[]'$ Fever  '[]'$ Chills Cardiac: '[x]'$ Chest pain   '[]'$ Chest pressure   '[x]'$ Palpitations   '[]'$ Shortness of breath when laying flat   '[]'$ Shortness of breath at rest   '[x]'$ Shortness of breath with exertion. Vascular:  '[]'$ Pain in legs with walking   '[]'$ Pain in legs at rest   '[]'$ Pain in legs when laying flat   '[]'$ Claudication   '[]'$ Pain in feet when walking  '[]'$ Pain in feet at rest  '[]'$ Pain in feet when laying flat   '[]'$ History of DVT   '[]'$   Phlebitis   '[]'$ Swelling in legs    '[]'$ Varicose veins   '[]'$ Non-healing ulcers Pulmonary:   '[]'$ Uses home oxygen   '[]'$ Productive cough   '[]'$ Hemoptysis   '[]'$ Wheeze  '[]'$ COPD   '[]'$ Asthma Neurologic:  '[]'$ Dizziness  '[]'$ Blackouts   '[]'$ Seizures   '[]'$ History of stroke   '[]'$ History of TIA  '[]'$ Aphasia   '[]'$ Temporary blindness   '[]'$ Dysphagia   '[]'$ Weakness or numbness in arms   '[]'$ Weakness or numbness in legs Musculoskeletal:  '[x]'$ Arthritis   '[]'$ Joint swelling   '[]'$ Joint pain   '[]'$ Low back pain Hematologic:  '[]'$ Easy bruising  '[]'$ Easy bleeding   '[]'$ Hypercoagulable state   '[]'$ Anemic  '[]'$ Hepatitis Gastrointestinal:  '[]'$ Blood in stool   '[]'$ Vomiting blood  '[]'$ Gastroesophageal reflux/heartburn   '[]'$ Abdominal pain Genitourinary:  '[]'$ Chronic kidney disease   '[]'$ Difficult urination  '[]'$ Frequent urination  '[]'$ Burning with urination   '[]'$ Hematuria Skin:  '[]'$ Rashes   '[]'$ Ulcers   '[]'$ Wounds Psychological:  '[]'$ History of anxiety   '[]'$  History of major depression.    Physical Exam BP (!) 151/87 (BP Location: Right Arm)   Pulse (!) 58   Resp 18   Ht '5\' 9"'$  (1.753 m)   Wt 199 lb (90.3 kg)   BMI 29.39 kg/m  Gen:  WD/WN, NAD Head: [place]/AT, No temporalis wasting.  Ear/Nose/Throat: Hearing grossly intact, nares w/o erythema or drainage, oropharynx w/o Erythema/Exudate Eyes: Conjunctiva clear, sclera non-icteric  Neck: trachea midline.  No JVD.  Pulmonary:  Good air movement, respirations not labored, no use of accessory muscles  Cardiac: RRR, no JVD Vascular:  Vessel Right Left  Radial Palpable Palpable                                   Gastrointestinal:. No masses, surgical incisions, or scars. Musculoskeletal: M/S 5/5 throughout.  Extremities without ischemic changes.  No deformity or atrophy. No edema. Neurologic: Sensation grossly intact in extremities.  Symmetrical.  Speech is fluent. Motor exam as listed above. Psychiatric: Judgment intact, Mood & affect appropriate for pt's clinical situation. Dermatologic: No rashes or ulcers noted.  No cellulitis or open  wounds.    Radiology CUP PACEART REMOTE DEVICE CHECK  Result Date: 09/10/2022 Scheduled remote reviewed. Normal device function.  Next remote 91 days- JJB   Labs Recent Results (from the past 2160 hour(s))  Basic metabolic panel     Status: Abnormal   Collection Time: 08/07/22 10:52 AM  Result Value Ref Range   Sodium 135 135 - 145 mmol/L   Potassium 4.2 3.5 - 5.1 mmol/L   Chloride 105 98 - 111 mmol/L   CO2 21 (L) 22 - 32 mmol/L   Glucose, Bld 106 (H) 70 - 99 mg/dL    Comment: Glucose reference range applies only to samples taken after fasting for at least 8 hours.   BUN 18 8 - 23 mg/dL   Creatinine, Ser 1.08 0.61 - 1.24 mg/dL   Calcium 9.0 8.9 - 10.3 mg/dL   GFR, Estimated >60 >60 mL/min    Comment: (NOTE) Calculated using the CKD-EPI Creatinine Equation (2021)    Anion gap 9 5 - 15    Comment: Performed at [facility], [address] [place],  [address]  CBC     Status: Abnormal   Collection Time: 08/07/22 10:52 AM  Result Value Ref Range   WBC 13.2 (H) 4.0 - 10.5 K/uL   RBC 5.32 4.22 - 5.81 MIL/uL   Hemoglobin 15.9 13.0 - 17.0 g/dL  HCT 50.6 39.0 - 52.0 %   MCV 95.1 80.0 - 100.0 fL   MCH 29.9 26.0 - 34.0 pg   MCHC 31.4 30.0 - 36.0 g/dL   RDW 13.7 11.5 - 15.5 %   Platelets 196 150 - 400 K/uL   nRBC 0.0 0.0 - 0.2 %    Comment: Performed at [facility], [address] [place], [place] [address]  Brain natriuretic peptide     Status: Abnormal   Collection Time: 08/07/22 10:52 AM  Result Value Ref Range   B Natriuretic Peptide 110.3 (H) 0.0 - 100.0 pg/mL    Comment: Performed at [facility], [address] [place], [place] [address]  Resp panel by RT-PCR (RSV, Flu A&B, Covid) Anterior Nasal Swab     Status: None   Collection Time: 08/07/22 11:02 AM   Specimen: Anterior Nasal Swab  Result Value Ref Range   SARS Coronavirus 2 by RT PCR NEGATIVE NEGATIVE    Comment: (NOTE) SARS-CoV-2 target nucleic acids are NOT  DETECTED.  The SARS-CoV-2 RNA is generally detectable in upper respiratory specimens during the acute phase of infection. The lowest concentration of SARS-CoV-2 viral copies this assay can detect is 138 copies/mL. A negative result does not preclude SARS-Cov-2 infection and should not be used as the sole basis for treatment or other patient management decisions. A negative result may occur with  improper specimen collection/handling, submission of specimen other than nasopharyngeal swab, presence of viral mutation(s) within the areas targeted by this assay, and inadequate number of viral copies(<138 copies/mL). A negative result must be combined with clinical observations, patient history, and epidemiological information. The expected result is Negative.  Fact Sheet for Patients:  EntrepreneurPulse.com.au  Fact Sheet for Healthcare Providers:  IncredibleEmployment.be  This test is no t yet approved or cleared by the [place] FDA and  has been authorized for detection and/or diagnosis of SARS-CoV-2 by FDA under an Emergency Use Authorization (EUA). This EUA will remain  in effect (meaning this test can be used) for the duration of the COVID-19 declaration under Section 564(b)(1) of the Act, 21 U.S.C.section 360bbb-3(b)(1), unless the authorization is terminated  or revoked sooner.       Influenza A by PCR NEGATIVE NEGATIVE   Influenza B by PCR NEGATIVE NEGATIVE    Comment: (NOTE) The Xpert Xpress SARS-CoV-2/FLU/RSV plus assay is intended as an aid in the diagnosis of influenza from Nasopharyngeal swab specimens and should not be used as a sole basis for treatment. Nasal washings and aspirates are unacceptable for Xpert Xpress SARS-CoV-2/FLU/RSV testing.  Fact Sheet for Patients: EntrepreneurPulse.com.au  Fact Sheet for Healthcare Providers: IncredibleEmployment.be  This test is not yet approved or  cleared by the [place] FDA and has been authorized for detection and/or diagnosis of SARS-CoV-2 by FDA under an Emergency Use Authorization (EUA). This EUA will remain in effect (meaning this test can be used) for the duration of the COVID-19 declaration under Section 564(b)(1) of the Act, 21 U.S.C. section 360bbb-3(b)(1), unless the authorization is terminated or revoked.     Resp Syncytial Virus by PCR NEGATIVE NEGATIVE    Comment: (NOTE) Fact Sheet for Patients: EntrepreneurPulse.com.au  Fact Sheet for Healthcare Providers: IncredibleEmployment.be  This test is not yet approved or cleared by the [place] FDA and has been authorized for detection and/or diagnosis of SARS-CoV-2 by FDA under an Emergency Use Authorization (EUA). This EUA will remain in effect (meaning this test can be used) for the duration of the  COVID-19 declaration under Section 564(b)(1) of the Act, 21 U.S.C. section 360bbb-3(b)(1), unless the authorization is terminated or revoked.  Performed at [facility], [address] [place], [place] [address]   CBC w/Diff     Status: Abnormal   Collection Time: 08/15/22  1:56 PM  Result Value Ref Range   WBC 10.5 4.0 - 10.5 K/uL   RBC 5.09 4.22 - 5.81 Mil/uL   Hemoglobin 15.5 13.0 - 17.0 g/dL   HCT 47.6 39.0 - 52.0 %   MCV 93.5 78.0 - 100.0 fl   MCHC 32.6 30.0 - 36.0 g/dL   RDW 14.3 11.5 - 15.5 %   Platelets 220.0 150.0 - 400.0 K/uL   Neutrophils Relative % 46.8 43.0 - 77.0 %   Lymphocytes Relative 39.4 12.0 - 46.0 %   Monocytes Relative 12.0 3.0 - 12.0 %   Eosinophils Relative 1.1 0.0 - 5.0 %   Basophils Relative 0.7 0.0 - 3.0 %   Neutro Abs 4.9 1.4 - 7.7 K/uL   Lymphs Abs 4.1 (H) 0.7 - 4.0 K/uL   Monocytes Absolute 1.3 (H) 0.1 - 1.0 K/uL   Eosinophils Absolute 0.1 0.0 - 0.7 K/uL   Basophils Absolute 0.1 0.0 - 0.1 K/uL  CUP PACEART REMOTE DEVICE CHECK     Status: None   Collection Time: 09/10/22   2:22 AM  Result Value Ref Range   Date Time Interrogation Session [id number]    Pulse Generator Manufacturer BOST    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    Pulse Gen Serial Number [id number]    Clinic Name [facility]    Implantable Pulse Generator Type Implantable Cardiac Defibulator    Implantable Pulse Generator Implant Date [id number]:[id number]    Implantable Lead Manufacturer BOST    Implantable Lead Model 0292 [employer]iance 4-Site SG    Implantable Lead Serial Number [id number]    Implantable Lead Implant Date YS:[id number]    Implantable Lead Location [id number]    Implantable Lead Connection Status [id number]    Lead Channel Setting Sensing Sensitivity 0.6 mV   Lead Channel Setting Sensing Adaptation Mode Adaptive Sensing    Lead Channel Setting Pacing Pulse Width 0.5 ms   Lead Channel Setting Pacing Amplitude 2.4 V   Zone Setting Status Active    Zone Setting Status [phone]    Lead Channel Impedance Value 642 ohm   Lead Channel Pacing Threshold Amplitude 0.5 V   Lead Channel Pacing Threshold Pulse Width 0.5 ms   HighPow Impedance 90 ohm   Battery Status BOS    Battery Remaining Longevity 30 mo   Battery Remaining Percentage 30 %   Brady Statistic RV Percent Paced 0 %  CBC and differential     Status: None   Collection Time: 09/18/22 12:00 AM  Result Value Ref Range   Hemoglobin 15.4 13.5 - 17.5   HCT 46 41 - 53   Platelets 176 150 - 400 K/uL   WBC 7.9   Basic metabolic panel     Status: Abnormal   Collection Time: 09/18/22 12:00 AM  Result Value Ref Range   Glucose 105    BUN 27 (A) 4 - 21   CO2 22 13 - 22   Creatinine 1.6 (A) 0.6 - 1.3   Potassium 3.8 3.5 - 5.1 mEq/L   Sodium 133 (A) 137 - 147   Chloride 22 (A) 99 - 108  Lipid panel     Status: None   Collection Time: 09/18/22 12:00 AM  Result Value Ref Range   Triglycerides 145 40 - 160   Cholesterol 157 0 - 200   HDL 38 35 - 70   LDL Cholesterol 90   Hemoglobin A1c     Status: None   Collection Time: 09/18/22 12:00 AM  Result  Value Ref Range   Hemoglobin A1C 5.7     Assessment/Plan:  Carotid artery stenosis Carotid duplex showed heterogeneous plaque bilaterally with velocities that would fall into the upper end of the 1 to 39% range. Already on DAPT and a statin agent. No role for intervention.  Recheck with duplex in 6 months.   Essential hypertension blood pressure control important in reducing the progression of atherosclerotic disease. On appropriate oral medications.   Stage 3a chronic kidney disease ([place]) Follow with duplex and avoid contrast if possible.  Lipoma of skin and subcutaneous tissue of neck Has been referred to ENT as well.  Hyperlipidemia lipid control important in reducing the progression of atherosclerotic disease. Continue statin therapy      Leotis Pain 10/08/2022, 12:19 PM   This note was created with Dragon medical transcription system.  Any errors from dictation are unintentional.

## 2022-10-08 NOTE — Assessment & Plan Note (Signed)
lipid control important in reducing the progression of atherosclerotic disease. Continue statin therapy  

## 2022-10-09 ENCOUNTER — Ambulatory Visit (INDEPENDENT_AMBULATORY_CARE_PROVIDER_SITE_OTHER): Payer: Medicare Other | Admitting: Urology

## 2022-10-09 ENCOUNTER — Encounter: Payer: Self-pay | Admitting: Urology

## 2022-10-09 VITALS — BP 143/82 | HR 62 | Ht 69.0 in | Wt 190.0 lb

## 2022-10-09 DIAGNOSIS — N529 Male erectile dysfunction, unspecified: Secondary | ICD-10-CM

## 2022-10-09 DIAGNOSIS — N138 Other obstructive and reflux uropathy: Secondary | ICD-10-CM

## 2022-10-09 DIAGNOSIS — N401 Enlarged prostate with lower urinary tract symptoms: Secondary | ICD-10-CM

## 2022-10-09 LAB — BLADDER SCAN AMB NON-IMAGING

## 2022-10-09 MED ORDER — TADALAFIL 20 MG PO TABS: 10.0000 mg | ORAL_TABLET | Freq: Every day | ORAL | 11 refills | Status: DC | PRN

## 2022-10-09 NOTE — Progress Notes (Signed)
   10/09/2022 1:31 PM   Ian Mills 1956-07-10 [id number]  Reason for visit: Follow up history of BPH, ED, PSA screening  HPI: Comorbid 67 year old male who had urinary retention ~5 years ago in [place] and underwent an outlet procedure with likely a TURP at that time.  Those records are not available to me.  He has been urinating well since that time with no changes in urination over the last year, and PVR is normal at 19 mL today.,  Gross hematuria, or incontinence.He denies any urinary complaint  PSA November 2022 normal at 0.74.  Likely can discontinue further screening with his comorbidities and age  He continues to take the Cialis 10 to 20 mg on demand for ED. he feels the 10 mg dose is not quite enough, and he would like to increase to 20 mg tablets.  Cialis increased to 20 mg on demand RTC 1 year PVR  Billey Co, MD  [facility] [address], [place], [place] [address] [phone]

## 2022-10-10 ENCOUNTER — Encounter: Payer: Self-pay | Admitting: Internal Medicine

## 2022-10-11 DIAGNOSIS — H6122 Impacted cerumen, left ear: Secondary | ICD-10-CM | POA: Diagnosis not present

## 2022-10-11 DIAGNOSIS — R221 Localized swelling, mass and lump, neck: Secondary | ICD-10-CM | POA: Diagnosis not present

## 2022-10-14 NOTE — Progress Notes (Signed)
Remote ICD transmission.   

## 2022-10-16 ENCOUNTER — Other Ambulatory Visit: Payer: Self-pay | Admitting: Cardiovascular Disease

## 2022-10-16 ENCOUNTER — Other Ambulatory Visit: Payer: Self-pay | Admitting: Internal Medicine

## 2022-10-21 NOTE — Progress Notes (Unsigned)
Date:  10/22/2022   ID:  Ian Mills, DOB 03-27-1956, MRN [id number]  Patient Location:  [id number] [place] [id number]   Provider location:   Arthor Captain, [place] office  PCP:  Carollee Leitz, MD  Cardiologist:  Patsy Baltimore   Chief Complaint  Patient presents with   6 month follow up     Hospital admission at [facility] for rotator cuff tear in 09/2022.  Referred to Dr. Lucky Cowboy  carotid artery stenosis.  Medications reviewed verbally    History of Present Illness:    Ian Mills is a 67 y.o. male past medical history of Smoking, continues to smoke 1 pack a week coronary artery disease,  3 stents placed to his mid LAD, one stent to the PDA, one stent to the OM 3 in 08/2006  chest pressure during his divorce with stress test at [facility],  cardiac catheterization at [facility], 11/2011 syncope x4 episodes total over the past several years ECHO EF 25 to 30%, 2015 ICD, ejection fraction 30-35%, chronic back and hip pain ischemic cardiomyopathy He presents today for follow-up of his coronary artery disease,  Heart catheterization , syncope  Last seen in clinic by myself sept 2023 Followed by CHF clinic last seen August 2023, pulse at that time 68  In hospital 2/24: [facility] Presented with left shoulder pain, reports that they ruled out a cardiac issue Echo ef 25%,  Continues to have left shoulder pain, scheduled to see orthopedics Told he has a tear in rotator cuff , left side  B/l  ICA are consistent with a 1-39%   Feels his medications are working well, compliant with his list Torsemide as needed Stopped smoking 3 years ago Getting Entresto through [employer] patient assistance  In the past spends much of his time on the water in the sun, kayaking  Denies chest pain concerning for angina  ICD downloads reviewed, no significant arrhythmia noted  EKG personally reviewed by myself on todays visit Normal  sinus rhythm rate 63 bpm old anterior MI, left axis deviation  Remote history of syncope  Acute onset, with no warning, was standing in the house, Prior history of orthostasis  catheterization early 2021 1. Significant underlying three-vessel coronary artery disease with chronically occluded mid LAD, patent first diagonal stent, patent RCA stents with mild in-stent restenosis, patent left circumflex stent with moderate in-stent restenosis.  Progression of ostial left circumflex stenosis to 60%. 2.  Left ventricular angiography was not performed due to chronic kidney disease. 3.  Moderately elevated left ventricular end-diastolic pressure between 25 to 30 mmHg. No  revascularization recommended  Quit smoking 08/2020  Stress test August 10, 2019 showing fixed defects anterior wall   Seen in the hospital February 2020 for COPD exacerbation PNA   Previously with back and sciatic pain, improved with cortisone shots, two years ago seen by [facility] for chronic back pain   Past Medical History:  Diagnosis Date   Acute anterior wall MI ([place]) 07/04/1991   a.) LHC: EF 29%; 100% mLAD -> PTCA performed   Acute cough 08/16/2022   Anxiety    Aortic atherosclerosis ([place])    Baker's cyst 06/10/2019   5.3 cm 01/2018 noted [place] no repair as of 06/10/2019      Bilateral impacted cerumen 06/10/2019   Cellulitis of left foot 05/10/2022   CHF (congestive heart failure) (HCC)    COPD (chronic obstructive pulmonary disease) (  [place])    Coronary artery disease    a.) 3 stents --> 3x18 mm Duet to OM3, 2.5x18 mm Duet to D1, 2.5x13 mm Duet to RPDA. b. 06/2013 PCI: CTO LAD, patent LAD stent, RCA 70p (FFR 0.74--> DES), 29m (DES), RPDA 50%, EF 20%; c. 08/2019 Cath: LM nl, LAD 100p/m, D1 60, D2 patent stent, LCX 60ost/p, 78m ISR, RCA 30p, 18m ISR.   Dizziness 03/30/2015   Family history of lung cancer    HFrEF (heart failure with reduced ejection fraction) ([place])    a. 05/2014 Echo: EF 25-30%; b. 01/2018  Echo ([facility]): EF 30%; c. 06/2021 Echo: EF 25-30%, apical AK, GrI DD, nl RV fxn.   History of prostate surgery 06/09/2020   History of ventricular tachycardia 01/23/2012   HLD (hyperlipidemia)    HTN (hypertension)    ICD  single,BSX    a. DOI 11/2011; b. S/N# [id number]   Insomnia 06/01/2014   Ischemic cardiomyopathy    a. 05/2014 Echo: EF 25-30%; b. 01/2018 Echo ([facility]): EF 30%, c. 06/2021 Echo: EF 25-30%.   Lateral wall myocardial infarction ([place]) 06/29/1997   a.) LHC: EF 44%; 75% RPDA and 95% OM3; PCI performed and a 3.0 x 18 mm Duet stent placed to OM3   Leukocytosis 08/16/2022   Long term current use of antithrombotics/antiplatelets    a.) DAPT therapy (ASA + clopidogrel)   NSTEMI (non-ST elevated myocardial infarction) ([place]) 11/27/2011   a.) LHC: 100% mLAD; intervention deferred opting for medical management.   NSTEMI (non-ST elevated myocardial infarction) ([place]) 06/23/2016   Personal history of colonic polyps    Pneumonia 08/2018   Sacroiliac pain 07/31/2016   Formatting of this note might be different from the original. Added automatically from request for surgery [id number]   Sepsis ([place]) 07/29/2021   Sepsis due to group A Streptococcus with acute renal failure and septic shock ([place])    Syncope and collapse    Syncope and collapse 09/26/2010   Qualifier: Diagnosis of   By: Rockey Situ MD, Tim       Tobacco abuse 06/13/2016   URI (upper respiratory infection) 03/17/2022   Ventricular tachycardia ([place]) 12/01/2011   a.) VT arrest; ROSC achieved --[place] placed.   Past Surgical History:  Procedure Laterality Date   APPENDECTOMY N/A    CARDIAC DEFIBRILLATOR PLACEMENT N/A 12/01/2011   COLONOSCOPY WITH PROPOFOL N/A 12/22/2020   Procedure: COLONOSCOPY WITH PROPOFOL;  Surgeon: Jonathon Bellows, MD;  Location: [facility] ENDOSCOPY;  Service: Gastroenterology;  Laterality: N/A;   CORONARY ANGIOPLASTY Left 07/05/1991   Procedure: CORONARY ANGIOPLASTY; Location: [facility]; Surgeon: Doristine Bosworth, MD   CORONARY ANGIOPLASTY Left 08/06/1991   Procedure: CORONARY ANGIOPLASTY; Location: [facility]; Surgeon: Ellouise Newer, MD   CORONARY ANGIOPLASTY Left 08/09/1991   Procedure: CORONARY ANGIOPLASTY; Location: [facility]; Surgeon: Ellouise Newer, MD   CORONARY ANGIOPLASTY Left 10/28/1991   Procedure: CORONARY ANGIOPLASTY; Location: [facility]; Surgeon: Ellouise Newer, MD   CORONARY ANGIOPLASTY Left 11/02/1991   Procedure: CORONARY ANGIOPLASTY; Location: [facility]; Surgeon: Ellouise Newer, MD   CORONARY ANGIOPLASTY Left 01/10/1992   Procedure: CORONARY ANGIOPLASTY; Location: [facility]; Surgeon: Carles Collet, MD   CORONARY ANGIOPLASTY Left 09/26/1993   Procedure: CORONARY ANGIOPLASTY; Location: [facility]; Surgeon: Melton Alar, MD   CORONARY ANGIOPLASTY Left 09/11/2006   Procedure: CORONARY ANGIOPLASTY; Location: [facility]   CORONARY ANGIOPLASTY Left 01/17/2010   Procedure: CORONARY ANGIOPLASTY; Location: [facility]; Surgeon: Wendee Beavers, MD   CORONARY ANGIOPLASTY Left 11/27/2011   Procedure: CORONARY ANGIOPLASTY; Location: [facility]; Surgeon: Derinda Sis, MD   CORONARY ANGIOPLASTY  WITH STENT PLACEMENT Left 06/29/1997   Procedure: CORONARY ANGIOPLASTY WITH STENT PLACEMENT (3.0 x 18 mm Diet stent to OM3); Location: [facility]; Surgeon: Allie Bossier, MD   CORONARY ANGIOPLASTY WITH STENT PLACEMENT Left 07/12/1998   Procedure: CORONARY ANGIOPLASTY WITH STENT PLACEMENT (2.8 x 8 mm Duet stent to D1, 2.5 x 13 mm Duet stent to RPDA); Location: [facility]   GRAFT APPLICATION Left [id number]   Procedure: GRAFT APPLICATION;  Surgeon: Benjamine Sprague, DO;  Location: [facility] ORS;  Service: General;  Laterality: Left;   LEFT HEART CATH AND CORONARY ANGIOGRAPHY Left 08/30/2019   Procedure: LEFT HEART CATH AND CORONARY ANGIOGRAPHY;  Surgeon: Wellington Hampshire, MD;  Location: [facility];  Service: Cardiovascular;  Laterality: Left;   PROSTATE SURGERY     urethra grew around prostate    TUMOR REMOVAL  2001   chest thymic cyst; behind lungs [facility] benign       Allergies:   Nitroglycerin   Social History   Tobacco Use   Smoking status: Former    Packs/day: 0.50    Years: 42.00    Additional pack years: 0.00    Total pack years: 21.00    Types: Cigarettes    Quit date: 11/10/2019    Years since quitting: 2.9   Smokeless tobacco: Never  Vaping Use   Vaping Use: Never used  Substance Use Topics   Alcohol use: Yes    Alcohol/week: 4.0 standard drinks of alcohol    Types: 4 Cans of beer per week    Comment: per week   Drug use: No     Current Outpatient Medications on File Prior to Visit  Medication Sig Dispense Refill   albuterol (VENTOLIN HFA) 108 (90 Base) MCG/ACT inhaler Inhale 1-2 puffs into the lungs every 6 (six) hours as needed for wheezing or shortness of breath. 18 g 11   ALPRAZolam (XANAX) 0.5 MG tablet Take 1 tablet (0.5 mg total) by mouth 3 (three) times daily as needed. for anxiety 90 tablet 0   [START ON 11/10/2022] ALPRAZolam (XANAX) 0.5 MG tablet Take 1 tablet (0.5 mg total) by mouth 3 (three) times daily as needed. for anxiety 90 tablet 0   amiodarone (PACERONE) 200 MG tablet TAKE 1 TABLET BY MOUTH 5 DAYS A WEEK 90 tablet 0   aspirin EC 81 MG tablet Take 81 mg by mouth daily. Swallow whole.     atorvastatin (LIPITOR) 80 MG tablet Take 1 tablet (80 mg total) by mouth daily. 90 tablet 3   carvedilol (COREG) 3.125 MG tablet Take 1 tablet (3.125 mg total) by mouth 2 (two) times daily. 180 tablet 3   clopidogrel (PLAVIX) 75 MG tablet Take 1 tablet (75 mg total) by mouth daily. 90 tablet 3   dapagliflozin propanediol (FARXIGA) 10 MG TABS tablet Take 1 tablet (10 mg total) by mouth daily before breakfast. 90 tablet 3   ezetimibe (ZETIA) 10 MG tablet Take 1 tablet (10 mg total) by mouth daily. 90 tablet 3   potassium chloride (KLOR-CON 10) 10 MEQ tablet Take 1 tablet (10 mEq total) by mouth daily. 90 tablet 3   sacubitril-valsartan (ENTRESTO) 49-51 MG Take 1 tablet by mouth 2 (two) times daily. 180 tablet 1   spironolactone  (ALDACTONE) 25 MG tablet Take 1 tablet by mouth once daily 30 tablet 0   tadalafil (CIALIS) 20 MG tablet Take 0.5-1 tablets (10-20 mg total) by mouth daily as needed for erectile dysfunction (take 1 hour prior to sexual activity). 30 tablet 11  Tiotropium Bromide-Olodaterol (STIOLTO RESPIMAT) 2.5-2.5 MCG/ACT AERS Inhale 2 puffs into the lungs daily. 4 g 11   tiZANidine (ZANAFLEX) 4 MG tablet Take 1 tablet (4 mg total) by mouth every 6 (six) hours as needed for muscle spasms. 30 tablet 0   ipratropium-albuterol (DUONEB) 0.5-2.5 (3) MG/3ML SOLN Take 3 mLs by nebulization every 6 (six) hours as needed. (Patient not taking: Reported on 10/08/2022) 360 mL 11   torsemide (DEMADEX) 20 MG tablet Take 1 tablet (20 mg) by mouth on M, W, F.  You may take 1 extra tablet (20 mg) by mouth once daily as needed for swelling/ weight gain/ shortness of breath (Patient not taking: Reported on 10/09/2022) 135 tablet 5   No current facility-administered medications on file prior to visit.     Family Hx: The patient's family history includes Cancer in his father; Cancer - Lung in his mother; Dementia in his maternal grandmother; Heart attack in his mother and paternal grandfather; Hypertension in his mother. There is no history of Colon cancer, Stomach cancer, or Pancreatic cancer.  ROS:   Please see the history of present illness.    Review of Systems  Constitutional: Negative.   HENT: Negative.    Respiratory: Negative.    Cardiovascular: Negative.   Gastrointestinal: Negative.   Musculoskeletal: Negative.   Neurological:  Positive for loss of consciousness.  Psychiatric/Behavioral: Negative.    All other systems reviewed and are negative.    Labs/Other Tests and Data Reviewed:    Recent Labs: 06/25/2022: ALT 24; TSH 1.834 08/07/2022: B Natriuretic Peptide 110.3 09/18/2022: BUN 27; Creatinine 1.6; Hemoglobin 15.4; Platelets 176; Potassium 3.8; Sodium 133   Recent Lipid Panel Lab Results  Component Value  Date/Time   CHOL 157 09/18/2022 12:00 AM   CHOL 170 12/09/2016 11:03 AM   CHOL 111 01/20/2014 09:29 AM   TRIG 145 09/18/2022 12:00 AM   TRIG 88 01/20/2014 09:29 AM   HDL 38 09/18/2022 12:00 AM   HDL 53 12/09/2016 11:03 AM   HDL 39 (L) 01/20/2014 09:29 AM   CHOLHDL 3 02/27/2022 02:18 PM   LDLCALC 90 09/18/2022 12:00 AM   LDLCALC 72 03/03/2020 02:17 PM   LDLCALC 54 01/20/2014 09:29 AM    Wt Readings from Last 3 Encounters:  10/22/22 202 lb 12.8 oz (92 kg)  10/09/22 190 lb (86.2 kg)  10/08/22 199 lb (90.3 kg)     Exam:    Vital Signs: Vital signs may also be detailed in the HPI BP 130/76 (BP Location: Right Arm, Patient Position: Sitting, Cuff Size: Normal)   Pulse 63   Ht 5\' 8"  (1.727 m)   Wt 202 lb 12.8 oz (92 kg)   SpO2 97%   BMI 30.84 kg/m   Constitutional:  oriented to person, place, and time. No distress.  HENT:  Head: Grossly normal Eyes:  no discharge. No scleral icterus.  Neck: No JVD, no carotid bruits  Cardiovascular: Regular rate and rhythm, no murmurs appreciated Pulmonary/Chest: Clear to auscultation bilaterally, no wheezes or rails Abdominal: Soft.  no distension.  no tenderness.  Musculoskeletal: Normal range of motion Neurological:  normal muscle tone. Coordination normal. No atrophy Skin: Skin warm and dry Psychiatric: normal affect, pleasant   ASSESSMENT & PLAN:    Problem List Items Addressed This Visit       Cardiology Problems   Essential hypertension   Ischemic cardiomyopathy - Primary   Coronary artery disease of native artery of native heart with stable angina pectoris ([place])  Aortic atherosclerosis (HCC)     Other   Single implantable cardioverter-defibrillator-BSx   Other Visit Diagnoses     Chronic systolic congestive heart failure (HCC)       Ventricular tachycardia (HCC)         Syncope Remote episodes felt secondary to orthostasis Denies any near-syncope or syncope episodes, Blood pressure stable, no medication changes  made No significant arrhythmia on ICD downloads  Ischemic cardiomyopathy Continue current medication regiment Recently seen in [facility] for atypical left shoulder pain Echocardiogram done through their system, EF 25% Appears euvolemic, takes torsemide as needed  Essential hypertension Hypotension noted and recent syncope Medication changes as above  History of VT Ventricular tachyarrhythmia during skin graft procedure January 2023,  No recent ICD shocks  Hyperlipidemia Stressed importance of taking his Lipitor and Zetia  no changes to the medications were made.  Coronary disease with stable angina Active, denies angina, enjoys kayaking Reports that he stopped smoking 3 years ago    Total encounter time more than 30 minutes  Greater than 50% was spent in counseling and coordination of care with the patient     Signed, Ida Rogue, [address], [place], [place] [address]

## 2022-10-22 ENCOUNTER — Encounter: Payer: Self-pay | Admitting: Cardiovascular Disease

## 2022-10-22 ENCOUNTER — Ambulatory Visit: Payer: Medicare Other | Attending: Cardiovascular Disease | Admitting: Cardiovascular Disease

## 2022-10-22 VITALS — BP 130/76 | HR 63 | Ht 68.0 in | Wt 202.8 lb

## 2022-10-22 DIAGNOSIS — I700 Atherosclerosis of aorta: Secondary | ICD-10-CM

## 2022-10-22 DIAGNOSIS — I5022 Chronic systolic (congestive) heart failure: Secondary | ICD-10-CM

## 2022-10-22 DIAGNOSIS — I25118 Atherosclerotic heart disease of native coronary artery with other forms of angina pectoris: Secondary | ICD-10-CM

## 2022-10-22 DIAGNOSIS — I10 Essential (primary) hypertension: Secondary | ICD-10-CM

## 2022-10-22 DIAGNOSIS — I472 Ventricular tachycardia, unspecified: Secondary | ICD-10-CM

## 2022-10-22 DIAGNOSIS — Z95810 Presence of automatic (implantable) cardiac defibrillator: Secondary | ICD-10-CM

## 2022-10-22 DIAGNOSIS — I255 Ischemic cardiomyopathy: Secondary | ICD-10-CM

## 2022-10-22 DIAGNOSIS — G8929 Other chronic pain: Secondary | ICD-10-CM | POA: Diagnosis not present

## 2022-10-22 DIAGNOSIS — M7502 Adhesive capsulitis of left shoulder: Secondary | ICD-10-CM | POA: Diagnosis not present

## 2022-10-22 DIAGNOSIS — M19012 Primary osteoarthritis, left shoulder: Secondary | ICD-10-CM | POA: Diagnosis not present

## 2022-10-22 MED ORDER — ENTRESTO 49-51 MG PO TABS: 1.0000 | ORAL_TABLET | Freq: Two times a day (BID) | ORAL | 3 refills | Status: DC

## 2022-10-22 MED ORDER — SPIRONOLACTONE 25 MG PO TABS: 25.0000 mg | ORAL_TABLET | Freq: Every day | ORAL | 3 refills | Status: DC

## 2022-10-22 NOTE — Patient Instructions (Addendum)
Medication Instructions:  No changes  If you need a refill on your cardiac medications before your next appointment, please call your pharmacy.   Lab work: No new labs needed  Testing/Procedures: No new testing needed  Follow-Up: At [facility], you and your health needs are our priority.  As part of our continuing mission to provide you with exceptional heart care, we have created designated Provider Care Teams.  These Care Teams include your primary Cardiologist (physician) and Advanced Practice Providers (APPs -  Physician Assistants and Nurse Practitioners) who all work together to provide you with the care you need, when you need it.  You will need a follow up appointment in 6 months, APP ok  Providers on your designated Care Team:   Christopher Berge, NP Ryan Dunn, PA-C Cadence Furth, PA-C  COVID-19 Vaccine Information can be found at: [email or web address] For questions related to vaccine distribution or appointments, please email [email or web address] or call [phone].   

## 2022-10-24 ENCOUNTER — Telehealth: Payer: Self-pay | Admitting: Family Medicine

## 2022-10-24 NOTE — Telephone Encounter (Signed)
Kathlee Nations from [facility] need a copy of a CT neck scan

## 2022-10-28 NOTE — Telephone Encounter (Signed)
I faxed the neck image results to [facility], fax confirmation given.  Parke Jandreau,cma

## 2022-10-29 ENCOUNTER — Encounter (INDEPENDENT_AMBULATORY_CARE_PROVIDER_SITE_OTHER): Payer: Medicare Other | Admitting: Vascular Surgery

## 2022-10-29 ENCOUNTER — Encounter (INDEPENDENT_AMBULATORY_CARE_PROVIDER_SITE_OTHER): Payer: Medicare Other

## 2022-11-01 ENCOUNTER — Encounter (INDEPENDENT_AMBULATORY_CARE_PROVIDER_SITE_OTHER): Payer: Medicare Other

## 2022-11-01 ENCOUNTER — Encounter (INDEPENDENT_AMBULATORY_CARE_PROVIDER_SITE_OTHER): Payer: Medicare Other | Admitting: Vascular Surgery

## 2022-11-04 ENCOUNTER — Telehealth: Payer: Self-pay | Admitting: Cardiovascular Disease

## 2022-11-04 NOTE — Telephone Encounter (Signed)
Fax received from [facility] & ME PAF.  [place] & [place] Shipment Notification: Below is an update on your patient's shipment from the [facility] & [place] Prescription Savings Program.   Allow 1-2 business days for shipment to arrive. Shipment was sent via [employer] [id number] on 11/01/22 with tracking #: [id number] to: [address], [place], [place] [address]. Check status of your patient's shipment or request a refill via [email or web address] or 1-800AZandMe 978-609-9816).

## 2022-11-26 ENCOUNTER — Encounter: Payer: Self-pay | Admitting: Family

## 2022-11-26 ENCOUNTER — Ambulatory Visit: Payer: Medicare Other | Attending: Family | Admitting: Family

## 2022-11-26 VITALS — BP 129/68 | HR 59 | Wt 205.1 lb

## 2022-11-26 DIAGNOSIS — Z7982 Long term (current) use of aspirin: Secondary | ICD-10-CM | POA: Insufficient documentation

## 2022-11-26 DIAGNOSIS — I6529 Occlusion and stenosis of unspecified carotid artery: Secondary | ICD-10-CM | POA: Insufficient documentation

## 2022-11-26 DIAGNOSIS — I472 Ventricular tachycardia, unspecified: Secondary | ICD-10-CM | POA: Insufficient documentation

## 2022-11-26 DIAGNOSIS — M79602 Pain in left arm: Secondary | ICD-10-CM

## 2022-11-26 DIAGNOSIS — I2510 Atherosclerotic heart disease of native coronary artery without angina pectoris: Secondary | ICD-10-CM | POA: Insufficient documentation

## 2022-11-26 DIAGNOSIS — Z87891 Personal history of nicotine dependence: Secondary | ICD-10-CM | POA: Insufficient documentation

## 2022-11-26 DIAGNOSIS — I255 Ischemic cardiomyopathy: Secondary | ICD-10-CM | POA: Insufficient documentation

## 2022-11-26 DIAGNOSIS — I25118 Atherosclerotic heart disease of native coronary artery with other forms of angina pectoris: Secondary | ICD-10-CM

## 2022-11-26 DIAGNOSIS — I5022 Chronic systolic (congestive) heart failure: Secondary | ICD-10-CM | POA: Diagnosis not present

## 2022-11-26 DIAGNOSIS — Z79899 Other long term (current) drug therapy: Secondary | ICD-10-CM | POA: Diagnosis not present

## 2022-11-26 DIAGNOSIS — E785 Hyperlipidemia, unspecified: Secondary | ICD-10-CM | POA: Insufficient documentation

## 2022-11-26 DIAGNOSIS — I10 Essential (primary) hypertension: Secondary | ICD-10-CM | POA: Diagnosis not present

## 2022-11-26 DIAGNOSIS — J449 Chronic obstructive pulmonary disease, unspecified: Secondary | ICD-10-CM | POA: Insufficient documentation

## 2022-11-26 DIAGNOSIS — I252 Old myocardial infarction: Secondary | ICD-10-CM | POA: Insufficient documentation

## 2022-11-26 DIAGNOSIS — Z955 Presence of coronary angioplasty implant and graft: Secondary | ICD-10-CM | POA: Diagnosis not present

## 2022-11-26 DIAGNOSIS — Z95810 Presence of automatic (implantable) cardiac defibrillator: Secondary | ICD-10-CM | POA: Insufficient documentation

## 2022-11-26 DIAGNOSIS — F419 Anxiety disorder, unspecified: Secondary | ICD-10-CM | POA: Diagnosis not present

## 2022-11-26 DIAGNOSIS — M25512 Pain in left shoulder: Secondary | ICD-10-CM | POA: Diagnosis not present

## 2022-11-26 DIAGNOSIS — I110 Hypertensive heart disease with heart failure: Secondary | ICD-10-CM | POA: Insufficient documentation

## 2022-11-26 NOTE — Progress Notes (Signed)
[facility] HEART FAILURE CLINIC - Pharmacist Education Note  Assessment Ian Mills is a 67 y.o. male with HFrEF (EF <40%) presenting to the Heart Failure Clinic for follow up. Patient reports no issues with current regimen. He reports checking his weight at least every 2 weeks and that his usual weight is around 197 lbs. He reports no issues with volume overload since stopping torsemide. Patient to have ICD investigated today. He reports no issues and no recent ICD fires. He also reports regular ICD checks at home as directed by the manufacturer.  Recent ED Visit (past 6 months):  Date: 08/07/2022, CC: SOB  Guideline-Directed Medical Therapy/Evidence Based Medicine ACE/ARB/ARNI: Sacubitril/valsartan 49/51 mg BID Beta Blocker: Carvedilol 3.125 mg twice daily Aldosterone Antagonist: Spironolactone 25 mg daily Diuretic:  none  SGLT2i: Dapagliflozin 10 mg daily  Adherence Assessment Do you ever forget to take your medication? [] Yes [x] No  Do you ever skip doses due to side effects? [] Yes [x] No  Do you have trouble affording your medicines? [] Yes [x] No  Are you ever unable to pick up your medication due to transportation difficulties? [] Yes [x] No  Do you ever stop taking your medications because you don't believe they are helping? [] Yes [x] No  Do you check your weight daily? [] Yes [x] No (at least Q2wk)  Adherence strategy: Patient states that taking his medications is part of his routine. Barriers to obtaining medications: None reported  Diagnostics ECHO: Date 09/18/2022, EF 20-25%, Technically difficult study   Vitals    11/26/2022   10:49 AM 10/22/2022   11:54 AM 10/09/2022    1:24 PM  Vitals with BMI  Height  5\' 8"  5\' 9"   Weight 205 lbs 2 oz 202 lbs 13 oz 190 lbs  BMI  30.84 28.05  Systolic 129 130 161  Diastolic 68 76 82  Pulse 59 63 62     Recent Labs    Latest Ref Rng & Units 09/18/2022   12:00 AM 08/07/2022   10:52 AM 06/25/2022    1:40 PM  BMP  Glucose 70 - 99 mg/dL   096  86   BUN 4 - 21 27     18  27    Creatinine 0.6 - 1.3 1.6     1.08  1.11   Sodium 137 - 147 133     135  135   Potassium 3.5 - 5.1 mEq/L 3.8     4.2  4.6   Chloride 99 - 108 22     105  106   CO2 13 - 22 22     21  20    Calcium 8.9 - 10.3 mg/dL  9.0  9.6      This result is from an external source.    Past Medical History Past Medical History:  Diagnosis Date   Acute anterior wall MI (HCC) 07/04/1991   a.) LHC: EF 29%; 100% mLAD -> PTCA performed   Acute cough 08/16/2022   Anxiety    Aortic atherosclerosis (HCC)    Baker's cyst 06/10/2019   5.3 cm 01/2018 noted [place] no repair as of 06/10/2019      Bilateral impacted cerumen 06/10/2019   Cellulitis of left foot 05/10/2022   CHF (congestive heart failure) (HCC)    COPD (chronic obstructive pulmonary disease) (HCC)    Coronary artery disease    a.) 3 stents --> 3x18 mm Duet to OM3, 2.5x18 mm Duet to D1, 2.5x13 mm Duet to RPDA. b. 06/2013 PCI: CTO LAD, patent LAD stent, RCA 70p (FFR  0.74--> DES), 34m (DES), RPDA 50%, EF 20%; c. 08/2019 Cath: LM nl, LAD 100p/m, D1 60, D2 patent stent, LCX 60ost/p, 10m ISR, RCA 30p, 2m ISR.   Dizziness 03/30/2015   Family history of lung cancer    HFrEF (heart failure with reduced ejection fraction) (HCC)    a. 05/2014 Echo: EF 25-30%; b. 01/2018 Echo ([facility]): EF 30%; c. 06/2021 Echo: EF 25-30%, apical AK, GrI DD, nl RV fxn.   History of prostate surgery 06/09/2020   History of ventricular tachycardia 01/23/2012   HLD (hyperlipidemia)    HTN (hypertension)    ICD  single,BSX    a. DOI 11/2011; b. S/N# [id number]   Insomnia 06/01/2014   Ischemic cardiomyopathy    a. 05/2014 Echo: EF 25-30%; b. 01/2018 Echo ([facility]): EF 30%, c. 06/2021 Echo: EF 25-30%.   Lateral wall myocardial infarction (HCC) 06/29/1997   a.) LHC: EF 44%; 75% RPDA and 95% OM3; PCI performed and a 3.0 x 18 mm Duet stent placed to OM3   Leukocytosis 08/16/2022   Long term current use of antithrombotics/antiplatelets    a.) DAPT therapy  (ASA + clopidogrel)   NSTEMI (non-ST elevated myocardial infarction) (HCC) 11/27/2011   a.) LHC: 100% mLAD; intervention deferred opting for medical management.   NSTEMI (non-ST elevated myocardial infarction) (HCC) 06/23/2016   Personal history of colonic polyps    Pneumonia 08/2018   Sacroiliac pain 07/31/2016   Formatting of this note might be different from the original. Added automatically from request for surgery [phone]   Sepsis (HCC) 07/29/2021   Sepsis due to group A Streptococcus with acute renal failure and septic shock (HCC)    Syncope and collapse    Syncope and collapse 09/26/2010   Qualifier: Diagnosis of   By: Mariah Milling MD, Tim       Tobacco abuse 06/13/2016   URI (upper respiratory infection) 03/17/2022   Ventricular tachycardia (HCC) 12/01/2011   a.) VT arrest; ROSC achieved --> [employer] AICD placed.    Plan CHF Continue on carvedilol 3.125 mg twice daily, spironolactone 25 mg daily, dapagliflozin 10 mg daily, and Entresto 49/51 twice daily Repeat labs today  Encourage daily weight checks at home   Time spent: 15 minutes  Celene Squibb, PharmD PGY1 Pharmacy Resident 11/26/2022 11:21 AM

## 2022-11-26 NOTE — Progress Notes (Signed)
Patient ID: Ian Mills, male    DOB: Aug 25, 1955, 67 y.o.   MRN: [id number]  Primary cardiologist: Julien Nordmann, MD (last seen 03/24) PCP: Dana Allan, MD (last seen 01/24) EP: Sherryl Manges, MD (last seen 11/23)  HPI  Ian Mills is a 67 y/o male with a history of CAD, hyperlipidemia, HTN, anxiety, aortic atherosclerosis, COPD, VT (AICD) previous tobacco use and chronic heart failure.   Echo 09/18/22: EF 25%.  Echo 07/12/21: EF of 25-30%  LHC done 08/30/19 showed: Prox RCA to Mid RCA lesion is 20% stenosed. Prox RCA lesion is 30% stenosed. Prox LAD to Mid LAD lesion is 100% stenosed. 1st Diag-1 lesion is 60% stenosed. Previously placed 1st Diag-2 stent (unknown type) is widely patent. Ost Cx to Prox Cx lesion is 60% stenosed. Mid Cx lesion is 40% stenosed.   1. Significant underlying three-vessel coronary artery disease with chronically occluded mid LAD, patent first diagonal stent, patent RCA stents with mild in-stent restenosis, patent left circumflex stent with moderate in-stent restenosis.  Progression of ostial left circumflex stenosis to 60%. 2.  Left ventricular angiography was not performed due to chronic kidney disease. 3.  Moderately elevated left ventricular end-diastolic pressure between 25 to 30 mmHg.  Admitted 03/24 d/t rotator cuff tear.   He presents today for a HF follow-up visit with a chief complaint of intermittent cough/ wheezing. Chronic in nature but feels like it's improving as the pollen is improving. Has associated back pain and easy bruising along with this. Denies difficulty sleeping, dizziness, abdominal distention, palpitations, pedal edema, chest pain, SOB or fatigue.   Has scales but not weighing daily.   Since he was last here he had a hospital admission at [facility] for possible rotator cuff tear in 08/2022. Referred to Dr. Wyn Quaker for carotid artery stenosis.   Past Medical History:  Diagnosis Date   Acute anterior wall MI (HCC) 07/04/1991    a.) LHC: EF 29%; 100% mLAD -> PTCA performed   Acute cough 08/16/2022   Anxiety    Aortic atherosclerosis (HCC)    Baker's cyst 06/10/2019   5.3 cm 01/2018 noted [place] no repair as of 06/10/2019      Bilateral impacted cerumen 06/10/2019   Cellulitis of left foot 05/10/2022   CHF (congestive heart failure) (HCC)    COPD (chronic obstructive pulmonary disease) (HCC)    Coronary artery disease    a.) 3 stents --> 3x18 mm Duet to OM3, 2.5x18 mm Duet to D1, 2.5x13 mm Duet to RPDA. b. 06/2013 PCI: CTO LAD, patent LAD stent, RCA 70p (FFR 0.74--> DES), 51m (DES), RPDA 50%, EF 20%; c. 08/2019 Cath: LM nl, LAD 100p/m, D1 60, D2 patent stent, LCX 60ost/p, 59m ISR, RCA 30p, 26m ISR.   Dizziness 03/30/2015   Family history of lung cancer    HFrEF (heart failure with reduced ejection fraction) (HCC)    a. 05/2014 Echo: EF 25-30%; b. 01/2018 Echo ([facility]): EF 30%; c. 06/2021 Echo: EF 25-30%, apical AK, GrI DD, nl RV fxn.   History of prostate surgery 06/09/2020   History of ventricular tachycardia 01/23/2012   HLD (hyperlipidemia)    HTN (hypertension)    ICD  single,BSX    a. DOI 11/2011; b. S/N# [id number]   Insomnia 06/01/2014   Ischemic cardiomyopathy    a. 05/2014 Echo: EF 25-30%; b. 01/2018 Echo ([facility]): EF 30%, c. 06/2021 Echo: EF 25-30%.   Lateral wall myocardial infarction (HCC) 06/29/1997   a.) LHC: EF 44%; 75% RPDA and  95% OM3; PCI performed and a 3.0 x 18 mm Duet stent placed to OM3   Leukocytosis 08/16/2022   Long term current use of antithrombotics/antiplatelets    a.) DAPT therapy (ASA + clopidogrel)   NSTEMI (non-ST elevated myocardial infarction) (HCC) 11/27/2011   a.) LHC: 100% mLAD; intervention deferred opting for medical management.   NSTEMI (non-ST elevated myocardial infarction) (HCC) 06/23/2016   Personal history of colonic polyps    Pneumonia 08/2018   Sacroiliac pain 07/31/2016   Formatting of this note might be different from the original. Added automatically from request for  surgery [phone]   Sepsis (HCC) 07/29/2021   Sepsis due to group A Streptococcus with acute renal failure and septic shock (HCC)    Syncope and collapse    Syncope and collapse 09/26/2010   Qualifier: Diagnosis of   By: Mariah Milling MD, Tim       Tobacco abuse 06/13/2016   URI (upper respiratory infection) 03/17/2022   Ventricular tachycardia (HCC) 12/01/2011   a.) VT arrest; ROSC achieved --> [employer] AICD placed.   Past Surgical History:  Procedure Laterality Date   APPENDECTOMY N/A    CARDIAC DEFIBRILLATOR PLACEMENT N/A 12/01/2011   COLONOSCOPY WITH PROPOFOL N/A 12/22/2020   Procedure: COLONOSCOPY WITH PROPOFOL;  Surgeon: Wyline Mood, MD;  Location: [facility] ENDOSCOPY;  Service: Gastroenterology;  Laterality: N/A;   CORONARY ANGIOPLASTY Left 07/05/1991   Procedure: CORONARY ANGIOPLASTY; Location: [facility]; Surgeon: Eugenia Pancoast, MD   CORONARY ANGIOPLASTY Left 08/06/1991   Procedure: CORONARY ANGIOPLASTY; Location: [facility]; Surgeon: Ned Clines, MD   CORONARY ANGIOPLASTY Left 08/09/1991   Procedure: CORONARY ANGIOPLASTY; Location: [facility]; Surgeon: Ned Clines, MD   CORONARY ANGIOPLASTY Left 10/28/1991   Procedure: CORONARY ANGIOPLASTY; Location: [facility]; Surgeon: Ned Clines, MD   CORONARY ANGIOPLASTY Left 11/02/1991   Procedure: CORONARY ANGIOPLASTY; Location: [facility]; Surgeon: Ned Clines, MD   CORONARY ANGIOPLASTY Left 01/10/1992   Procedure: CORONARY ANGIOPLASTY; Location: [facility]; Surgeon: Philomena Course, MD   CORONARY ANGIOPLASTY Left 09/26/1993   Procedure: CORONARY ANGIOPLASTY; Location: [facility]; Surgeon: Theone Murdoch, MD   CORONARY ANGIOPLASTY Left 09/11/2006   Procedure: CORONARY ANGIOPLASTY; Location: [facility]   CORONARY ANGIOPLASTY Left 01/17/2010   Procedure: CORONARY ANGIOPLASTY; Location: [facility]; Surgeon: Freddi Che, MD   CORONARY ANGIOPLASTY Left 11/27/2011   Procedure: CORONARY ANGIOPLASTY; Location: [facility]; Surgeon: Nolon Rod, MD   CORONARY ANGIOPLASTY WITH STENT PLACEMENT Left  06/29/1997   Procedure: CORONARY ANGIOPLASTY WITH STENT PLACEMENT (3.0 x 18 mm Diet stent to OM3); Location: [facility]; Surgeon: Samuella Cota, MD   CORONARY ANGIOPLASTY WITH STENT PLACEMENT Left 07/12/1998   Procedure: CORONARY ANGIOPLASTY WITH STENT PLACEMENT (2.8 x 8 mm Duet stent to D1, 2.5 x 13 mm Duet stent to RPDA); Location: [facility]   GRAFT APPLICATION Left 08/24/2021   Procedure: GRAFT APPLICATION;  Surgeon: Sung Amabile, DO;  Location: [facility] ORS;  Service: General;  Laterality: Left;   LEFT HEART CATH AND CORONARY ANGIOGRAPHY Left 08/30/2019   Procedure: LEFT HEART CATH AND CORONARY ANGIOGRAPHY;  Surgeon: Iran Ouch, MD;  Location: [facility] INVASIVE CV LAB;  Service: Cardiovascular;  Laterality: Left;   PROSTATE SURGERY     urethra grew around prostate    TUMOR REMOVAL  2001   chest thymic cyst; behind lungs [facility] benign    Family History  Problem Relation Age of Onset   Cancer - Lung Mother    Hypertension Mother    Heart attack Mother    Cancer Father        larynx   Dementia Maternal  Grandmother    Heart attack Paternal Grandfather    Colon cancer Neg Hx    Stomach cancer Neg Hx    Pancreatic cancer Neg Hx    Social History   Tobacco Use   Smoking status: Former    Packs/day: 0.50    Years: 42.00    Additional pack years: 0.00    Total pack years: 21.00    Types: Cigarettes    Quit date: 11/10/2019    Years since quitting: 3.0   Smokeless tobacco: Never  Substance Use Topics   Alcohol use: Yes    Alcohol/week: 4.0 standard drinks of alcohol    Types: 4 Cans of beer per week    Comment: per week   Allergies  Allergen Reactions   Nitroglycerin Nausea Only and Other (See Comments)    Per patient "causes severe  headache"   Prior to Admission medications   Medication Sig Start Date End Date Taking? Authorizing Provider  albuterol (VENTOLIN HFA) 108 (90 Base) MCG/ACT inhaler Inhale 1-2 puffs into the lungs every 6 (six) hours as needed for wheezing or shortness of  breath. 02/08/22  Yes McLean-Scocuzza, Pasty Spillers, MD  ALPRAZolam Prudy Feeler) 0.5 MG tablet Take 1 tablet (0.5 mg total) by mouth 3 (three) times daily as needed. for anxiety 11/10/22 12/10/22 Yes Dana Allan, MD  amiodarone (PACERONE) 200 MG tablet TAKE 1 TABLET BY MOUTH 5 DAYS A WEEK 10/16/22  Yes [facility] Salvia, MD  aspirin EC 81 MG tablet Take 81 mg by mouth daily. Swallow whole.   Yes [provider]  atorvastatin (LIPITOR) 80 MG tablet Take 1 tablet (80 mg total) by mouth daily. 02/08/22  Yes McLean-Scocuzza, Pasty Spillers, MD  carvedilol (COREG) 3.125 MG tablet Take 1 tablet (3.125 mg total) by mouth 2 (two) times daily. 02/08/22  Yes McLean-Scocuzza, Pasty Spillers, MD  clopidogrel (PLAVIX) 75 MG tablet Take 1 tablet (75 mg total) by mouth daily. 02/08/22  Yes McLean-Scocuzza, Pasty Spillers, MD  dapagliflozin propanediol (FARXIGA) 10 MG TABS tablet Take 1 tablet (10 mg total) by mouth daily before breakfast. 03/13/22  Yes Clarisa Kindred A, FNP  ezetimibe (ZETIA) 10 MG tablet Take 1 tablet (10 mg total) by mouth daily. 02/08/22  Yes McLean-Scocuzza, Pasty Spillers, MD  ipratropium-albuterol (DUONEB) 0.5-2.5 (3) MG/3ML SOLN Take 3 mLs by nebulization every 6 (six) hours as needed. 08/07/22  Yes Willy Eddy, MD  potassium chloride (KLOR-CON 10) 10 MEQ tablet Take 1 tablet (10 mEq total) by mouth daily. 02/08/22  Yes McLean-Scocuzza, Pasty Spillers, MD  sacubitril-valsartan (ENTRESTO) 49-51 MG Take 1 tablet by mouth 2 (two) times daily. 10/22/22  Yes Antonieta Iba, MD  spironolactone (ALDACTONE) 25 MG tablet Take 1 tablet (25 mg total) by mouth daily. 10/22/22  Yes Antonieta Iba, MD  tadalafil (CIALIS) 20 MG tablet Take 0.5-1 tablets (10-20 mg total) by mouth daily as needed for erectile dysfunction (take 1 hour prior to sexual activity). 10/09/22  Yes Sondra Come, MD  Tiotropium Bromide-Olodaterol (STIOLTO RESPIMAT) 2.5-2.5 MCG/ACT AERS Inhale 2 puffs into the lungs daily. 02/08/22  Yes McLean-Scocuzza, Pasty Spillers, MD     Review of Systems  Constitutional:  Negative for appetite change and fatigue.  HENT:  Negative for congestion, postnasal drip and sore throat.   Eyes: Negative.   Respiratory:  Positive for cough (dry) and wheezing (at times). Negative for chest tightness and shortness of breath.   Cardiovascular:  Negative for chest pain, palpitations and leg swelling.  Gastrointestinal:  Negative for abdominal distention and abdominal pain.  Endocrine: Negative.   Genitourinary: Negative.   Musculoskeletal:  Positive for back pain. Negative for arthralgias.  Skin: Negative.   Allergic/Immunologic: Negative.   Neurological:  Negative for dizziness and light-headedness.  Hematological:  Negative for adenopathy. Bruises/bleeds easily.  Psychiatric/Behavioral:  Negative for dysphoric mood and sleep disturbance (sleeping on 1 pillow). The patient is not nervous/anxious.    Vitals:   11/26/22 1049  BP: 129/68  Pulse: (!) 59  SpO2: 100%  Weight: 205 lb 2 oz (93 kg)   Wt Readings from Last 3 Encounters:  11/26/22 205 lb 2 oz (93 kg)  10/22/22 202 lb 12.8 oz (92 kg)  10/09/22 190 lb (86.2 kg)   Lab Results  Component Value Date   CREATININE 1.6 (A) 09/18/2022   CREATININE 1.08 08/07/2022   CREATININE 1.11 06/25/2022   Physical Exam Vitals and nursing note reviewed.  Constitutional:      Appearance: Normal appearance.  HENT:     Head: Normocephalic and atraumatic.  Cardiovascular:     Rate and Rhythm: Regular rhythm. Bradycardia present.  Pulmonary:     Effort: Pulmonary effort is normal. No respiratory distress.     Breath sounds: No wheezing or rales.  Abdominal:     General: There is no distension.     Palpations: Abdomen is soft.  Musculoskeletal:        General: No tenderness.     Cervical back: Normal range of motion and neck supple.     Right lower leg: No edema.     Left lower leg: No edema.  Skin:    General: Skin is warm and dry.  Neurological:     General: No focal  deficit present.     Mental Status: He is alert and oriented to person, place, and time.  Psychiatric:        Mood and Affect: Mood normal.        Behavior: Behavior normal.    Assessment & Plan:  1: Ischemic heart failure with reduced ejection fraction- - NYHA class I - euvolemic today - not weighing daily; encouraged to resume daily weight so that he can call for an overnight weight gain of > 2 pounds or a weekly weight gain of >5 pounds - weight up 9 pounds from last visit here 6 months ago - Echo 09/18/22: EF 25%.  Echo 07/12/21: EF of 25-30% - etiology likely CAD - not adding salt and has been reading food labels for sodium content - continue carvedilol 3.125mg  BID - continue spironolactone 25mg  daily - continue farxiga 10mg  daily - continue entresto 49/51mg  BID - continue torsemide 20mg  PRN - BNP 08/07/22 was 110.3 - PharmD reconciled meds w/ patient  2: HTN- - BP 129/68 - saw PCP Clent Ridges) 01/24 - BMP 09/18/22 reviewed and showed sodium 133, potassium 3.8, creatinine 1.6 - BMP ordered  3: VT- - saw EP Graciela Husbands) 11/23 - has AICD present - ICD downloads reviewed today; no significant arrhythmia noted - continue amiodarone 200mg  5 days/ week  4: CAD- - saw cardiology Mariah Milling) 03/24 - continue ASA 81mg  daily - continue atorvastatin 80mg  daily - lipid panel ordered - LHC done 08/30/19 showed: Prox RCA to Mid RCA lesion is 20% stenosed. Prox RCA lesion is 30% stenosed. Prox LAD to Mid LAD lesion is 100% stenosed. 1st Diag-1 lesion is 60% stenosed. Previously placed 1st Diag-2 stent (unknown type) is widely patent. Ost Cx to Prox Cx lesion is 60% stenosed. Mid Cx  lesion is 40% stenosed.  5: Left shoulder pain- - saw orthopaedics Landry Mellow) 03/24 & had injection of left shoulder for possible adhesive capsulitis - reports currently no shoulder pain and "great" ROM   Return in 2 months, sooner if needed. At that time, discussed using [place] PRN

## 2022-11-26 NOTE — Patient Instructions (Addendum)
Go to [facility] in a couple of weeks and get your lab work drawn. You will go to the registration desk first before going to the lab.    Resume weighing daily and call for an overnight weight gain of 3 pounds or more or a weekly weight gain of more than 5 pounds.   If you receive a satisfaction survey regarding the [facility], please take the time to fill it out. This way we can continue to provide excellent care and make any changes that need to be made.

## 2022-11-28 DIAGNOSIS — H2513 Age-related nuclear cataract, bilateral: Secondary | ICD-10-CM | POA: Diagnosis not present

## 2022-11-28 DIAGNOSIS — H52223 Regular astigmatism, bilateral: Secondary | ICD-10-CM | POA: Diagnosis not present

## 2022-11-28 DIAGNOSIS — H5203 Hypermetropia, bilateral: Secondary | ICD-10-CM | POA: Diagnosis not present

## 2022-12-02 LAB — FUNGUS CULTURE, BLOOD
Culture: NO GROWTH
Special Requests: ADEQUATE

## 2022-12-09 ENCOUNTER — Ambulatory Visit (INDEPENDENT_AMBULATORY_CARE_PROVIDER_SITE_OTHER): Payer: Medicare Other

## 2022-12-09 DIAGNOSIS — I472 Ventricular tachycardia, unspecified: Secondary | ICD-10-CM | POA: Diagnosis not present

## 2022-12-10 LAB — CUP PACEART REMOTE DEVICE CHECK
Battery Remaining Longevity: 24 mo
Battery Remaining Percentage: 25 %
Brady Statistic RV Percent Paced: 0 %
Date Time Interrogation Session: 20240513023300
HighPow Impedance: 101 Ohm
Implantable Lead Connection Status: 753985
Implantable Lead Implant Date: 20130503
Implantable Lead Location: 753860
Implantable Lead Model: 292
Implantable Lead Serial Number: 112568
Implantable Pulse Generator Implant Date: 20130503
Lead Channel Impedance Value: 698 Ohm
Lead Channel Pacing Threshold Amplitude: 0.5 V
Lead Channel Pacing Threshold Pulse Width: 0.5 ms
Lead Channel Setting Pacing Amplitude: 2.4 V
Lead Channel Setting Pacing Pulse Width: 0.5 ms
Lead Channel Setting Sensing Sensitivity: 0.6 mV
Pulse Gen Serial Number: 105232
Zone Setting Status: 755011

## 2022-12-11 ENCOUNTER — Other Ambulatory Visit
Admission: RE | Admit: 2022-12-11 | Discharge: 2022-12-11 | Disposition: A | Discharge status: Home / Self Care | Payer: Medicare Other | Source: Ambulatory Visit | Attending: Family | Admitting: Family

## 2022-12-11 DIAGNOSIS — I255 Ischemic cardiomyopathy: Secondary | ICD-10-CM | POA: Diagnosis not present

## 2022-12-11 LAB — BASIC METABOLIC PANEL
Anion gap: 8 (ref 5–15)
BUN: 29 mg/dL — ABNORMAL HIGH (ref 8–23)
CO2: 22 mmol/L (ref 22–32)
Calcium: 9.3 mg/dL (ref 8.9–10.3)
Chloride: 108 mmol/L (ref 98–111)
Creatinine, Ser: 1.19 mg/dL (ref 0.61–1.24)
GFR, Estimated: >60 mL/min (ref >60)
Glucose, Bld: 108 mg/dL — ABNORMAL HIGH (ref 70–99)
Potassium: 4.5 mmol/L (ref 3.5–5.1)
Sodium: 138 mmol/L (ref 135–145)

## 2022-12-11 LAB — LIPID PANEL
Cholesterol: 110 mg/dL (ref 0–200)
HDL: 35 mg/dL — ABNORMAL LOW (ref >40)
LDL Cholesterol: 60 mg/dL (ref 0–99)
Total CHOL/HDL Ratio: 3.1 RATIO
Triglycerides: 74 mg/dL (ref <150)
VLDL: 15 mg/dL (ref 0–40)

## 2022-12-13 ENCOUNTER — Other Ambulatory Visit: Payer: Self-pay | Admitting: Family Medicine

## 2022-12-13 DIAGNOSIS — F39 Unspecified mood [affective] disorder: Secondary | ICD-10-CM

## 2022-12-18 ENCOUNTER — Telehealth: Payer: Self-pay

## 2022-12-18 NOTE — Telephone Encounter (Signed)
Patient called clinic requesting lab results from 12/11/22.

## 2022-12-20 ENCOUNTER — Telehealth: Payer: Self-pay

## 2022-12-20 DIAGNOSIS — R001 Bradycardia, unspecified: Secondary | ICD-10-CM | POA: Insufficient documentation

## 2022-12-20 DIAGNOSIS — I472 Ventricular tachycardia, unspecified: Secondary | ICD-10-CM | POA: Insufficient documentation

## 2022-12-20 NOTE — Telephone Encounter (Signed)
Patient contacted the office this morning to obtain lab results. Per Gorin Simpson patient was given lab results.

## 2022-12-24 ENCOUNTER — Ambulatory Visit: Payer: Medicare Other | Attending: Internal Medicine | Admitting: Internal Medicine

## 2022-12-24 ENCOUNTER — Encounter: Payer: Self-pay | Admitting: Internal Medicine

## 2022-12-24 VITALS — BP 124/80 | HR 63 | Ht 68.0 in | Wt 205.0 lb

## 2022-12-24 DIAGNOSIS — R001 Bradycardia, unspecified: Secondary | ICD-10-CM | POA: Insufficient documentation

## 2022-12-24 DIAGNOSIS — I5022 Chronic systolic (congestive) heart failure: Secondary | ICD-10-CM | POA: Diagnosis not present

## 2022-12-24 DIAGNOSIS — I255 Ischemic cardiomyopathy: Secondary | ICD-10-CM | POA: Diagnosis not present

## 2022-12-24 DIAGNOSIS — Z95810 Presence of automatic (implantable) cardiac defibrillator: Secondary | ICD-10-CM | POA: Insufficient documentation

## 2022-12-24 DIAGNOSIS — I472 Ventricular tachycardia, unspecified: Secondary | ICD-10-CM | POA: Diagnosis not present

## 2022-12-24 LAB — PACEMAKER DEVICE OBSERVATION

## 2022-12-24 NOTE — Patient Instructions (Signed)
Medication Instructions:  Your physician has recommended you make the following change in your medication:   ** Stop Amiodarone  *If you need a refill on your cardiac medications before your next appointment, please call your pharmacy*   Lab Work: None ordered.  If you have labs (blood work) drawn today and your tests are completely normal, you will receive your results only by: MyChart Message (if you have MyChart) OR A paper copy in the mail If you have any lab test that is abnormal or we need to change your treatment, we will call you to review the results.   Testing/Procedures: None ordered.    Follow-Up: At [facility], you and your health needs are our priority.  As part of our continuing mission to provide you with exceptional heart care, we have created designated Provider Care Teams.  These Care Teams include your primary Cardiologist (physician) and Advanced Practice Providers (APPs -  Physician Assistants and Nurse Practitioners) who all work together to provide you with the care you need, when you need it.  We recommend signing up for the patient portal called "MyChart".  Sign up information is provided on this After Visit Summary.  MyChart is used to connect with patients for Virtual Visits (Telemedicine).  Patients are able to view lab/test results, encounter notes, upcoming appointments, etc.  Non-urgent messages can be sent to your provider as well.   To learn more about what you can do with MyChart, go to [email or web address].    Your next appointment:   12 months with Dr Graciela Husbands

## 2022-12-24 NOTE — Progress Notes (Signed)
Patient Care Team: Dana Allan, MD as PCP - General (Family Medicine) Antonieta Iba, MD as PCP - Cardiology (Cardiology) Duke Salvia, MD as PCP - Electrophysiology (Cardiology) Antonieta Iba, MD as Consulting Physician (Cardiology)   HPI  Ian Mills is a 67 y.o. male Seen in followup for ICD [facility] [employer] implanted May 2013 for secondary prevention-- ventricular tachycardia for which he also takes amiodarone .    Ischemic heart disease with prior stenting of his LAD PDA and OM last in 2008.     1/23 the patient was admitted with streptococcal infection and toxic shock syndrome; treated with prolonged antibiotics.  Was not seen by cardiology during the hospitalization.  Did not undergo TEE.   The patient denies chest pain, shortness of breath, nocturnal dyspnea, orthopnea or peripheral edema.  There have been no palpitations, lightheadedness or syncope.        Date Cr K Hgb  8/17     5/18  1.05 3.9   7/19  1.4 3.9   1/23 1.08 4.1   9/23 1.72 4.2 14.1  5/24 1.19 4.5 15.4   DATE TEST EF   2008 CMRI 30-35 Anterolateral Scar w/o viability       11/15 Echo   25-30 %   1/21 Myoview  <30% Prior Scar-anterior Inferolateral   2/21 LHC  LADp/m-T;CXo-60; RCA 20-30  12/22 Echo  25-30%   2/24 Myoview 33% Fixed defects (As above)      Past Medical History:  Diagnosis Date   Acute anterior wall MI (HCC) 07/04/1991   a.) LHC: EF 29%; 100% mLAD -> PTCA performed   Acute cough 08/16/2022   Anxiety    Aortic atherosclerosis (HCC)    Baker's cyst 06/10/2019   5.3 cm 01/2018 noted [place] no repair as of 06/10/2019      Bilateral impacted cerumen 06/10/2019   Cellulitis of left foot 05/10/2022   CHF (congestive heart failure) (HCC)    COPD (chronic obstructive pulmonary disease) (HCC)    Coronary artery disease    a.) 3 stents --> 3x18 mm Duet to OM3, 2.5x18 mm Duet to D1, 2.5x13 mm Duet to RPDA. b. 06/2013 PCI: CTO LAD, patent LAD stent, RCA 70p (FFR 0.74--> DES), 21m  (DES), RPDA 50%, EF 20%; c. 08/2019 Cath: LM nl, LAD 100p/m, D1 60, D2 patent stent, LCX 60ost/p, 26m ISR, RCA 30p, 13m ISR.   Dizziness 03/30/2015   Family history of lung cancer    HFrEF (heart failure with reduced ejection fraction) (HCC)    a. 05/2014 Echo: EF 25-30%; b. 01/2018 Echo ([facility]): EF 30%; c. 06/2021 Echo: EF 25-30%, apical AK, GrI DD, nl RV fxn.   History of prostate surgery 06/09/2020   History of ventricular tachycardia 01/23/2012   HLD (hyperlipidemia)    HTN (hypertension)    ICD  single,BSX    a. DOI 11/2011; b. S/N# 161096   Insomnia 06/01/2014   Ischemic cardiomyopathy    a. 05/2014 Echo: EF 25-30%; b. 01/2018 Echo ([facility]): EF 30%, c. 06/2021 Echo: EF 25-30%.   Lateral wall myocardial infarction (HCC) 06/29/1997   a.) LHC: EF 44%; 75% RPDA and 95% OM3; PCI performed and a 3.0 x 18 mm Duet stent placed to OM3   Leukocytosis 08/16/2022   Long term current use of antithrombotics/antiplatelets    a.) DAPT therapy (ASA + clopidogrel)   NSTEMI (non-ST elevated myocardial infarction) (HCC) 11/27/2011   a.) LHC: 100% mLAD; intervention deferred opting for medical management.  NSTEMI (non-ST elevated myocardial infarction) (HCC) 06/23/2016   Personal history of colonic polyps    Pneumonia 08/2018   Sacroiliac pain 07/31/2016   Formatting of this note might be different from the original. Added automatically from request for surgery [phone]   Sepsis (HCC) 07/29/2021   Sepsis due to group A Streptococcus with acute renal failure and septic shock (HCC)    Syncope and collapse    Syncope and collapse 09/26/2010   Qualifier: Diagnosis of   By: Mariah Milling MD, Tim       Tobacco abuse 06/13/2016   URI (upper respiratory infection) 03/17/2022   Ventricular tachycardia (HCC) 12/01/2011   a.) VT arrest; ROSC achieved --> [employer] AICD placed.    Past Surgical History:  Procedure Laterality Date   APPENDECTOMY N/A    CARDIAC DEFIBRILLATOR PLACEMENT N/A 12/01/2011    COLONOSCOPY WITH PROPOFOL N/A 12/22/2020   Procedure: COLONOSCOPY WITH PROPOFOL;  Surgeon: Wyline Mood, MD;  Location: [facility] ENDOSCOPY;  Service: Gastroenterology;  Laterality: N/A;   CORONARY ANGIOPLASTY Left 07/05/1991   Procedure: CORONARY ANGIOPLASTY; Location: [facility]; Surgeon: Eugenia Pancoast, MD   CORONARY ANGIOPLASTY Left 08/06/1991   Procedure: CORONARY ANGIOPLASTY; Location: [facility]; Surgeon: Ned Clines, MD   CORONARY ANGIOPLASTY Left 08/09/1991   Procedure: CORONARY ANGIOPLASTY; Location: [facility]; Surgeon: Ned Clines, MD   CORONARY ANGIOPLASTY Left 10/28/1991   Procedure: CORONARY ANGIOPLASTY; Location: [facility]; Surgeon: Ned Clines, MD   CORONARY ANGIOPLASTY Left 11/02/1991   Procedure: CORONARY ANGIOPLASTY; Location: [facility]; Surgeon: Ned Clines, MD   CORONARY ANGIOPLASTY Left 01/10/1992   Procedure: CORONARY ANGIOPLASTY; Location: [facility]; Surgeon: Philomena Course, MD   CORONARY ANGIOPLASTY Left 09/26/1993   Procedure: CORONARY ANGIOPLASTY; Location: [facility]; Surgeon: Theone Murdoch, MD   CORONARY ANGIOPLASTY Left 09/11/2006   Procedure: CORONARY ANGIOPLASTY; Location: [facility]   CORONARY ANGIOPLASTY Left 01/17/2010   Procedure: CORONARY ANGIOPLASTY; Location: [facility]; Surgeon: Freddi Che, MD   CORONARY ANGIOPLASTY Left 11/27/2011   Procedure: CORONARY ANGIOPLASTY; Location: [facility]; Surgeon: Nolon Rod, MD   CORONARY ANGIOPLASTY WITH STENT PLACEMENT Left 06/29/1997   Procedure: CORONARY ANGIOPLASTY WITH STENT PLACEMENT (3.0 x 18 mm Diet stent to OM3); Location: [facility]; Surgeon: Samuella Cota, MD   CORONARY ANGIOPLASTY WITH STENT PLACEMENT Left 07/12/1998   Procedure: CORONARY ANGIOPLASTY WITH STENT PLACEMENT (2.8 x 8 mm Duet stent to D1, 2.5 x 13 mm Duet stent to RPDA); Location: [facility]   GRAFT APPLICATION Left 08/24/2021   Procedure: GRAFT APPLICATION;  Surgeon: Sung Amabile, DO;  Location: [facility] ORS;  Service: General;  Laterality: Left;   LEFT HEART CATH AND CORONARY ANGIOGRAPHY Left 08/30/2019    Procedure: LEFT HEART CATH AND CORONARY ANGIOGRAPHY;  Surgeon: Iran Ouch, MD;  Location: [facility] INVASIVE CV LAB;  Service: Cardiovascular;  Laterality: Left;   PROSTATE SURGERY     urethra grew around prostate    TUMOR REMOVAL  2001   chest thymic cyst; behind lungs [facility] benign     Current Outpatient Medications  Medication Sig Dispense Refill   albuterol (VENTOLIN HFA) 108 (90 Base) MCG/ACT inhaler Inhale 1-2 puffs into the lungs every 6 (six) hours as needed for wheezing or shortness of breath. 18 g 11   ALPRAZolam (XANAX) 0.5 MG tablet TAKE 1 TABLET BY MOUTH THREE TIMES DAILY AS NEEDED FOR ANXIETY 90 tablet 0   amiodarone (PACERONE) 200 MG tablet TAKE 1 TABLET BY MOUTH 5 DAYS A WEEK 90 tablet 0   aspirin EC 81 MG tablet Take 81 mg by mouth daily. Swallow whole.  atorvastatin (LIPITOR) 80 MG tablet Take 1 tablet (80 mg total) by mouth daily. 90 tablet 3   carvedilol (COREG) 3.125 MG tablet Take 1 tablet (3.125 mg total) by mouth 2 (two) times daily. 180 tablet 3   clopidogrel (PLAVIX) 75 MG tablet Take 1 tablet (75 mg total) by mouth daily. 90 tablet 3   dapagliflozin propanediol (FARXIGA) 10 MG TABS tablet Take 1 tablet (10 mg total) by mouth daily before breakfast. 90 tablet 3   ezetimibe (ZETIA) 10 MG tablet Take 1 tablet (10 mg total) by mouth daily. 90 tablet 3   ipratropium-albuterol (DUONEB) 0.5-2.5 (3) MG/3ML SOLN Take 3 mLs by nebulization every 6 (six) hours as needed. 360 mL 11   potassium chloride (KLOR-CON 10) 10 MEQ tablet Take 1 tablet (10 mEq total) by mouth daily. 90 tablet 3   sacubitril-valsartan (ENTRESTO) 49-51 MG Take 1 tablet by mouth 2 (two) times daily. 180 tablet 3   spironolactone (ALDACTONE) 25 MG tablet Take 1 tablet (25 mg total) by mouth daily. 90 tablet 3   tadalafil (CIALIS) 20 MG tablet Take 0.5-1 tablets (10-20 mg total) by mouth daily as needed for erectile dysfunction (take 1 hour prior to sexual activity). 30 tablet 11   Tiotropium  Bromide-Olodaterol (STIOLTO RESPIMAT) 2.5-2.5 MCG/ACT AERS Inhale 2 puffs into the lungs daily. 4 g 11   No current facility-administered medications for this visit.    Allergies  Allergen Reactions   Nitroglycerin Nausea Only and Other (See Comments)    Per patient "causes severe  headache"    Review of Systems negative except from HPI and PMH  Physical Exam BP 124/80   Pulse 63   Ht 5\' 8"  (1.727 m)   Wt 205 lb (93 kg)   SpO2 98%   BMI 31.17 kg/m  Well developed and well nourished in no acute distress HENT normal Neck supple with JVP-flat Clear Device pocket well healed; without hematoma or erythema.  There is no tethering  Regular rate and rhythm, no  gallop No / murmur Abd-soft with active BS No Clubbing cyanosis  edema Skin-warm and dry A & Oriented  Grossly normal sensory and motor function  ECG sinus at 63 Intervals 19/10/40 Antero-lateral MI  Device function is normal. Programming changes none See Paceart for details    Assessment and  Plan  Ventricular tachycardia  Ischemic cardiomyopathy prior inferior wall and anterior wall MI (prolonged DAPT)  Congestive heart failure-chronic-systolic  Hypertension \ Renal insufficiency acute/chronic grade 3  Hyperlipidemia  Implantable defibrillator-. [employer]      Sinus bradycardia  Blood pressure is well-controlled, will continue him on the Entresto 49/51 spironolactone and carvedilol 3.125; \ Renal function surprisingly significantly improved.  No symptoms of angina, continue aspirin; he is also on Plavix; I do not see the indication for DAPT, I have reached out to Drs. Do and Gollan for clarification.  No interval ventricular tachycardia and indeed no ventricular tachycardia has been treated since 2013 and device implantation.  Will discontinue the amiodarone  Euvolemic.  Continue his spironolactone.

## 2022-12-25 ENCOUNTER — Ambulatory Visit (INDEPENDENT_AMBULATORY_CARE_PROVIDER_SITE_OTHER): Payer: Medicare Other | Admitting: Family Medicine

## 2022-12-25 ENCOUNTER — Encounter: Payer: Self-pay | Admitting: Family Medicine

## 2022-12-25 VITALS — BP 118/62 | HR 48 | Ht 68.0 in | Wt 205.0 lb

## 2022-12-25 DIAGNOSIS — F419 Anxiety disorder, unspecified: Secondary | ICD-10-CM

## 2022-12-25 DIAGNOSIS — E669 Obesity, unspecified: Secondary | ICD-10-CM

## 2022-12-25 DIAGNOSIS — F39 Unspecified mood [affective] disorder: Secondary | ICD-10-CM | POA: Diagnosis not present

## 2022-12-25 DIAGNOSIS — N1831 Chronic kidney disease, stage 3a: Secondary | ICD-10-CM

## 2022-12-25 DIAGNOSIS — I5022 Chronic systolic (congestive) heart failure: Secondary | ICD-10-CM

## 2022-12-25 DIAGNOSIS — Z79899 Other long term (current) drug therapy: Secondary | ICD-10-CM | POA: Diagnosis not present

## 2022-12-25 DIAGNOSIS — I10 Essential (primary) hypertension: Secondary | ICD-10-CM

## 2022-12-25 NOTE — Progress Notes (Signed)
   SUBJECTIVE:   Chief Complaint  Patient presents with   Medical Management of Chronic Issues   HPI Patient presents to clinic for chronic disease management  No acute concerns today.  Mood disorder Doing well on current dose of Xanax.  Recently refilled.  Denies any SI/HI.  HFrEF/ischemic heart disease Asymptomatic.  Taking Farxiga 10 mg daily, Entresto 49-51 mg daily, spironolactone 25 mg daily. Amiodarone was recently discontinued.  Follows with cardiology.     PERTINENT PMH / PSH: Previous tobacco use ICD HFrEF Bradycardia Hypertension Hyperlipidemia  OBJECTIVE:  BP 118/62   Pulse (!) 48   Ht 5\' 8"  (1.727 m)   Wt 205 lb (93 kg)   SpO2 99%   BMI 31.17 kg/m    Physical Exam Vitals reviewed.  Constitutional:      General: He is not in acute distress.    Appearance: Normal appearance. He is normal weight. He is not ill-appearing, toxic-appearing or diaphoretic.  Eyes:     General:        Right eye: No discharge.        Left eye: No discharge.  Cardiovascular:     Rate and Rhythm: Normal rate and regular rhythm.     Pulses: Normal pulses.     Heart sounds: Normal heart sounds.  Pulmonary:     Effort: Pulmonary effort is normal.     Breath sounds: Normal breath sounds. No wheezing or rhonchi.  Abdominal:     General: Bowel sounds are normal.  Musculoskeletal:        General: Normal range of motion.     Cervical back: Normal range of motion.  Skin:    General: Skin is warm and dry.  Neurological:     Mental Status: He is alert and oriented to person, place, and time. Mental status is at baseline.  Psychiatric:        Mood and Affect: Mood normal.        Behavior: Behavior normal.        Thought Content: Thought content normal.        Judgment: Judgment normal.     ASSESSMENT/PLAN:  Mood disorder (HCC) Assessment & Plan: Chronic. Stable.   None opioid contract today UDS Continue Alprazolam 0.5 mg TID prn    Chronic use of benzodiazepine for  therapeutic purpose -     ToxASSURE Select 13 (MW), Urine  Chronic systolic CHF (congestive heart failure) (HCC) Assessment & Plan: HFrEF with ICD.  Follows with Cardiology.  Amiodarone discontinued.  Euvolemic on exam. Continue carvedilol 3.125 mg twice daily Continue Farxiga 10 mg daily Continue Entresto 49-51 mg twice daily Continue Aldactone 25 mg daily Follow-up with cardiology    PDMP reviewed  Return for PCP.  3 months for video visit in 6 months office visit.  Dana Allan, MD

## 2022-12-25 NOTE — Patient Instructions (Signed)
It was a pleasure meeting you today. Thank you for allowing me to take part in your health care.  Our goals for today as we discussed include:  Please call 1 week before your prescription of Xanax has run out for a refill.  Follow up in 3 months for video visit  Follow up in 6 months  If you have any questions or concerns, please do not hesitate to call the office at [phone].  I look forward to our next visit and until then take care and stay safe.  Regards,   Dana Allan, MD   [facility]

## 2022-12-29 DIAGNOSIS — Z79899 Other long term (current) drug therapy: Secondary | ICD-10-CM | POA: Insufficient documentation

## 2022-12-29 NOTE — Assessment & Plan Note (Signed)
HFrEF with ICD.  Follows with Cardiology.  Amiodarone discontinued.  Euvolemic on exam. Continue carvedilol 3.125 mg twice daily Continue Farxiga 10 mg daily Continue Entresto 49-51 mg twice daily Continue Aldactone 25 mg daily Follow-up with cardiology

## 2022-12-29 NOTE — Assessment & Plan Note (Signed)
Chronic. Stable.   None opioid contract today UDS Continue Alprazolam 0.5 mg TID prn

## 2022-12-31 ENCOUNTER — Telehealth: Payer: Self-pay

## 2022-12-31 NOTE — Telephone Encounter (Signed)
Received a fax from [facility] stating that a shipment was sent via [employer] [id number] on 12/30/2022 with tracking number:[id number] to the patient's address. Notification states to allow 1-2 business days for shipment to arrive.

## 2023-01-02 NOTE — Progress Notes (Signed)
Remote ICD transmission.   

## 2023-01-13 ENCOUNTER — Other Ambulatory Visit: Payer: Self-pay | Admitting: Family Medicine

## 2023-01-13 DIAGNOSIS — F39 Unspecified mood [affective] disorder: Secondary | ICD-10-CM

## 2023-01-21 ENCOUNTER — Telehealth: Payer: Self-pay | Admitting: Family

## 2023-01-21 ENCOUNTER — Encounter: Payer: Medicare Other | Admitting: Family

## 2023-01-21 NOTE — Progress Notes (Deleted)
PCP: Primary Cardiologist:  HPI:  Primary cardiologist: Julien Nordmann, MD (last seen 03/24) PCP: Dana Allan, MD (last seen 01/24) EP: Sherryl Manges, MD (last seen 11/23)  HPI  Ian Mills is a 67 y/o male with a history of CAD, hyperlipidemia, HTN, anxiety, aortic atherosclerosis, COPD, VT (AICD) previous tobacco use and chronic heart failure.   Echo 09/18/22: EF 25%.  Echo 07/12/21: EF of 25-30%  LHC done 08/30/19 showed: Prox RCA to Mid RCA lesion is 20% stenosed. Prox RCA lesion is 30% stenosed. Prox LAD to Mid LAD lesion is 100% stenosed. 1st Diag-1 lesion is 60% stenosed. Previously placed 1st Diag-2 stent (unknown type) is widely patent. Ost Cx to Prox Cx lesion is 60% stenosed. Mid Cx lesion is 40% stenosed.   1. Significant underlying three-vessel coronary artery disease with chronically occluded mid LAD, patent first diagonal stent, patent RCA stents with mild in-stent restenosis, patent left circumflex stent with moderate in-stent restenosis.  Progression of ostial left circumflex stenosis to 60%. 2.  Left ventricular angiography was not performed due to chronic kidney disease. 3.  Moderately elevated left ventricular end-diastolic pressure between 25 to 30 mmHg.  Admitted 03/24 d/t rotator cuff tear.   He presents today for a HF follow-up visit with a chief complaint of intermittent cough/ wheezing. Chronic in nature but feels like it's improving as the pollen is improving. Has associated back pain and easy bruising along with this. Denies difficulty sleeping, dizziness, abdominal distention, palpitations, pedal edema, chest pain, SOB or fatigue.   Has scales but not weighing daily.   Since he was last here he had a hospital admission at [facility] for possible rotator cuff tear in 08/2022. Referred to Dr. Wyn Quaker for carotid artery stenosis.      ROS: All systems negative except as listed in HPI, PMH and Problem List.  SH:  Social History   Socioeconomic  History   Marital status: Legally Separated    Spouse name: Stanton Kidney    Number of children: 2   Years of education: Not on file   Highest education level: Not on file  Occupational History   Occupation: retired   Tobacco Use   Smoking status: Former    Packs/day: 0.50    Years: 42.00    Additional pack years: 0.00    Total pack years: 21.00    Types: Cigarettes    Quit date: 11/10/2019    Years since quitting: 3.2   Smokeless tobacco: Never  Vaping Use   Vaping Use: Never used  Substance and Sexual Activity   Alcohol use: Yes    Alcohol/week: 6.0 standard drinks of alcohol    Types: 6 Cans of beer per week    Comment: per week   Drug use: No   Sexual activity: Not on file  Other Topics Concern   Not on file  Social History Narrative   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    Lives in [place]    2 kids daughters    Gets regular exercise   Smoker   [employer]    Social Determinants of Health   Financial Resource Strain: Low Risk  (05/02/2022)   Overall Financial Resource Strain (CARDIA)    Difficulty of Paying Living Expenses: Not hard at all  Food Insecurity: No Food Insecurity (05/02/2022)   Hunger Vital Sign    Worried About Running Out of Food in the Last  Year: Never true    Ran Out of Food in the Last Year: Never true  Transportation Needs: No Transportation Needs (05/02/2022)   PRAPARE - Administrator, Civil Service (Medical): No    Lack of Transportation (Non-Medical): No  Physical Activity: Insufficiently Active (05/02/2022)   Exercise Vital Sign    Days of Exercise per Week: 4 days    Minutes of Exercise per Session: 20 min  Stress: No Stress Concern Present (05/02/2022)   [employer] of Occupational Health - Occupational Stress Questionnaire    Feeling of Stress : Not at all  Social Connections: Unknown (05/02/2022)   Social Connection and Isolation Panel [NHANES]     Frequency of Communication with Friends and Family: Not on file    Frequency of Social Gatherings with Friends and Family: Not on file    Attends Religious Services: Not on file    Active Member of Clubs or Organizations: Not on file    Attends Banker Meetings: Not on file    Marital Status: Married  Intimate Partner Violence: Not At Risk (05/02/2022)   Humiliation, Afraid, Rape, and Kick questionnaire    Fear of Current or Ex-Partner: No    Emotionally Abused: No    Physically Abused: No    Sexually Abused: No    FH:  Family History  Problem Relation Age of Onset   Cancer - Lung Mother    Hypertension Mother    Heart attack Mother    Cancer Father        larynx   Dementia Maternal Grandmother    Heart attack Paternal Grandfather    Colon cancer Neg Hx    Stomach cancer Neg Hx    Pancreatic cancer Neg Hx     Past Medical History:  Diagnosis Date   Acute anterior wall MI (HCC) 07/04/1991   a.) LHC: EF 29%; 100% mLAD -> PTCA performed   Acute cough 08/16/2022   Anxiety    Aortic atherosclerosis (HCC)    Baker's cyst 06/10/2019   5.3 cm 01/2018 noted [place] no repair as of 06/10/2019      Bilateral impacted cerumen 06/10/2019   Cellulitis of left foot 05/10/2022   CHF (congestive heart failure) (HCC)    COPD (chronic obstructive pulmonary disease) (HCC)    Coronary artery disease    a.) 3 stents --> 3x18 mm Duet to OM3, 2.5x18 mm Duet to D1, 2.5x13 mm Duet to RPDA. b. 06/2013 PCI: CTO LAD, patent LAD stent, RCA 70p (FFR 0.74--> DES), 26m (DES), RPDA 50%, EF 20%; c. 08/2019 Cath: LM nl, LAD 100p/m, D1 60, D2 patent stent, LCX 60ost/p, 29m ISR, RCA 30p, 62m ISR.   Dizziness 03/30/2015   Family history of lung cancer    HFrEF (heart failure with reduced ejection fraction) (HCC)    a. 05/2014 Echo: EF 25-30%; b. 01/2018 Echo ([facility]): EF 30%; c. 06/2021 Echo: EF 25-30%, apical AK, GrI DD, nl RV fxn.   History of prostate surgery 06/09/2020   History of ventricular  tachycardia 01/23/2012   HLD (hyperlipidemia)    HTN (hypertension)    ICD  single,BSX    a. DOI 11/2011; b. S/N# 347425   Insomnia 06/01/2014   Ischemic cardiomyopathy    a. 05/2014 Echo: EF 25-30%; b. 01/2018 Echo ([facility]): EF 30%, c. 06/2021 Echo: EF 25-30%.   Lateral wall myocardial infarction (HCC) 06/29/1997   a.) LHC: EF 44%; 75% RPDA and 95% OM3; PCI performed and a 3.0 x  18 mm Duet stent placed to OM3   Leukocytosis 08/16/2022   Long term current use of antithrombotics/antiplatelets    a.) DAPT therapy (ASA + clopidogrel)   NSTEMI (non-ST elevated myocardial infarction) (HCC) 11/27/2011   a.) LHC: 100% mLAD; intervention deferred opting for medical management.   NSTEMI (non-ST elevated myocardial infarction) (HCC) 06/23/2016   Personal history of colonic polyps    Pneumonia 08/2018   Sacroiliac pain 07/31/2016   Formatting of this note might be different from the original. Added automatically from request for surgery [phone]   Sepsis (HCC) 07/29/2021   Sepsis due to group A Streptococcus with acute renal failure and septic shock (HCC)    Syncope and collapse    Syncope and collapse 09/26/2010   Qualifier: Diagnosis of   By: Mariah Milling MD, Tim       Tobacco abuse 06/13/2016   URI (upper respiratory infection) 03/17/2022   Ventricular tachycardia (HCC) 12/01/2011   a.) VT arrest; ROSC achieved --> [employer] AICD placed.    Current Outpatient Medications  Medication Sig Dispense Refill   albuterol (VENTOLIN HFA) 108 (90 Base) MCG/ACT inhaler Inhale 1-2 puffs into the lungs every 6 (six) hours as needed for wheezing or shortness of breath. 18 g 11   ALPRAZolam (XANAX) 0.5 MG tablet TAKE 1 TABLET BY MOUTH THREE TIMES DAILY AS NEEDED FOR ANXIETY 90 tablet 0   aspirin EC 81 MG tablet Take 81 mg by mouth daily. Swallow whole.     atorvastatin (LIPITOR) 80 MG tablet Take 1 tablet (80 mg total) by mouth daily. 90 tablet 3   carvedilol (COREG) 3.125 MG tablet Take 1 tablet (3.125 mg  total) by mouth 2 (two) times daily. 180 tablet 3   clopidogrel (PLAVIX) 75 MG tablet Take 1 tablet (75 mg total) by mouth daily. 90 tablet 3   dapagliflozin propanediol (FARXIGA) 10 MG TABS tablet Take 1 tablet (10 mg total) by mouth daily before breakfast. 90 tablet 3   ezetimibe (ZETIA) 10 MG tablet Take 1 tablet (10 mg total) by mouth daily. 90 tablet 3   ipratropium-albuterol (DUONEB) 0.5-2.5 (3) MG/3ML SOLN Take 3 mLs by nebulization every 6 (six) hours as needed. 360 mL 11   potassium chloride (KLOR-CON 10) 10 MEQ tablet Take 1 tablet (10 mEq total) by mouth daily. 90 tablet 3   sacubitril-valsartan (ENTRESTO) 49-51 MG Take 1 tablet by mouth 2 (two) times daily. 180 tablet 3   spironolactone (ALDACTONE) 25 MG tablet Take 1 tablet (25 mg total) by mouth daily. 90 tablet 3   tadalafil (CIALIS) 20 MG tablet Take 0.5-1 tablets (10-20 mg total) by mouth daily as needed for erectile dysfunction (take 1 hour prior to sexual activity). 30 tablet 11   Tiotropium Bromide-Olodaterol (STIOLTO RESPIMAT) 2.5-2.5 MCG/ACT AERS Inhale 2 puffs into the lungs daily. 4 g 11   No current facility-administered medications for this visit.      PHYSICAL EXAM:  General:  Well appearing. No resp difficulty HEENT: normal Neck: supple. JVP flat. Carotids 2+ bilaterally; no bruits. No lymphadenopathy or thryomegaly appreciated. Cor: PMI normal. Regular rate & rhythm. No rubs, gallops or murmurs. Lungs: clear Abdomen: soft, nontender, nondistended. No hepatosplenomegaly. No bruits or masses. Good bowel sounds. Extremities: no cyanosis, clubbing, rash, edema Neuro: alert & orientedx3, cranial nerves grossly intact. Moves all 4 extremities w/o difficulty. Affect pleasant.   ECG:   ASSESSMENT & PLAN:  1: Ischemic heart failure with reduced ejection fraction- - NYHA class I - euvolemic today -  not weighing daily; encouraged to resume daily weight so that he can call for an overnight weight gain of > 2  pounds or a weekly weight gain of >5 pounds - weight up 9 pounds from last visit here 6 months ago - Echo 09/18/22: EF 25%.  Echo 07/12/21: EF of 25-30% - etiology likely CAD - not adding salt and has been reading food labels for sodium content - continue carvedilol 3.125mg  BID - continue spironolactone 25mg  daily - continue farxiga 10mg  daily - continue entresto 49/51mg  BID - continue torsemide 20mg  PRN - BNP 08/07/22 was 110.3 - PharmD reconciled meds w/ patient  2: HTN- - BP 129/68 - saw PCP Clent Ridges) 01/24 - BMP 09/18/22 reviewed and showed sodium 133, potassium 3.8, creatinine 1.6 - BMP ordered  3: VT- - saw EP Graciela Husbands) 11/23 - has AICD present - ICD downloads reviewed today; no significant arrhythmia noted - continue amiodarone 200mg  5 days/ week  4: CAD- - saw cardiology Mariah Milling) 03/24 - continue ASA 81mg  daily - continue atorvastatin 80mg  daily - lipid panel ordered - LHC done 08/30/19 showed: Prox RCA to Mid RCA lesion is 20% stenosed. Prox RCA lesion is 30% stenosed. Prox LAD to Mid LAD lesion is 100% stenosed. 1st Diag-1 lesion is 60% stenosed. Previously placed 1st Diag-2 stent (unknown type) is widely patent. Ost Cx to Prox Cx lesion is 60% stenosed. Mid Cx lesion is 40% stenosed.  5: Left shoulder pain- - saw orthopaedics Landry Mellow) 03/24 & had injection of left shoulder for possible adhesive capsulitis - reports currently no shoulder pain and "great" ROM   Return in 2 months, sooner if needed. At that time, discussed using [place] PRN

## 2023-01-21 NOTE — Telephone Encounter (Signed)
Patient did not show for his [facility] Failure [facility] appointment on 01/21/23.   

## 2023-02-11 ENCOUNTER — Other Ambulatory Visit: Payer: Self-pay | Admitting: Family Medicine

## 2023-02-11 DIAGNOSIS — F39 Unspecified mood [affective] disorder: Secondary | ICD-10-CM

## 2023-02-14 ENCOUNTER — Encounter: Payer: Self-pay | Admitting: Family

## 2023-02-14 ENCOUNTER — Ambulatory Visit: Payer: Medicare Other | Admitting: Family

## 2023-02-14 VITALS — BP 117/63 | HR 57 | Wt 200.2 lb

## 2023-02-14 DIAGNOSIS — I472 Ventricular tachycardia, unspecified: Secondary | ICD-10-CM | POA: Diagnosis not present

## 2023-02-14 DIAGNOSIS — F419 Anxiety disorder, unspecified: Secondary | ICD-10-CM | POA: Diagnosis not present

## 2023-02-14 DIAGNOSIS — I255 Ischemic cardiomyopathy: Secondary | ICD-10-CM

## 2023-02-14 DIAGNOSIS — I5022 Chronic systolic (congestive) heart failure: Secondary | ICD-10-CM | POA: Insufficient documentation

## 2023-02-14 DIAGNOSIS — N189 Chronic kidney disease, unspecified: Secondary | ICD-10-CM | POA: Diagnosis not present

## 2023-02-14 DIAGNOSIS — Z87891 Personal history of nicotine dependence: Secondary | ICD-10-CM | POA: Diagnosis not present

## 2023-02-14 DIAGNOSIS — J449 Chronic obstructive pulmonary disease, unspecified: Secondary | ICD-10-CM | POA: Diagnosis not present

## 2023-02-14 DIAGNOSIS — Z8674 Personal history of sudden cardiac arrest: Secondary | ICD-10-CM | POA: Diagnosis not present

## 2023-02-14 DIAGNOSIS — Z7982 Long term (current) use of aspirin: Secondary | ICD-10-CM | POA: Insufficient documentation

## 2023-02-14 DIAGNOSIS — Z95810 Presence of automatic (implantable) cardiac defibrillator: Secondary | ICD-10-CM | POA: Diagnosis not present

## 2023-02-14 DIAGNOSIS — M7989 Other specified soft tissue disorders: Secondary | ICD-10-CM | POA: Insufficient documentation

## 2023-02-14 DIAGNOSIS — I2510 Atherosclerotic heart disease of native coronary artery without angina pectoris: Secondary | ICD-10-CM | POA: Insufficient documentation

## 2023-02-14 DIAGNOSIS — Z8249 Family history of ischemic heart disease and other diseases of the circulatory system: Secondary | ICD-10-CM | POA: Diagnosis not present

## 2023-02-14 DIAGNOSIS — I10 Essential (primary) hypertension: Secondary | ICD-10-CM

## 2023-02-14 DIAGNOSIS — E785 Hyperlipidemia, unspecified: Secondary | ICD-10-CM | POA: Insufficient documentation

## 2023-02-14 DIAGNOSIS — I130 Hypertensive heart and chronic kidney disease with heart failure and stage 1 through stage 4 chronic kidney disease, or unspecified chronic kidney disease: Secondary | ICD-10-CM | POA: Diagnosis not present

## 2023-02-14 DIAGNOSIS — Z79899 Other long term (current) drug therapy: Secondary | ICD-10-CM | POA: Diagnosis not present

## 2023-02-14 DIAGNOSIS — Z955 Presence of coronary angioplasty implant and graft: Secondary | ICD-10-CM | POA: Insufficient documentation

## 2023-02-14 DIAGNOSIS — I700 Atherosclerosis of aorta: Secondary | ICD-10-CM | POA: Insufficient documentation

## 2023-02-14 MED ORDER — TORSEMIDE 20 MG PO TABS: 20.0000 mg | ORAL_TABLET | ORAL | 3 refills | Status: DC | PRN

## 2023-02-14 NOTE — Progress Notes (Signed)
PCP: Dana Allan, MD (last seen 05/24) Primary Cardiologist: Julien Nordmann, MD (last seen 03/24) EP: Sherryl Manges, MD (last seen 05/24)   HPI:  Ian Mills is a 67 y/o male with a history of CAD, hyperlipidemia, HTN, anxiety, aortic atherosclerosis, COPD, VT (AICD) previous tobacco use and chronic heart failure.   Echo 07/12/21: EF of 25-30% Echo 09/18/22: EF 25%.    LHC done 08/30/19 showed: Prox RCA to Mid RCA lesion is 20% stenosed. Prox RCA lesion is 30% stenosed. Prox LAD to Mid LAD lesion is 100% stenosed. 1st Diag-1 lesion is 60% stenosed. Previously placed 1st Diag-2 stent (unknown type) is widely patent. Ost Cx to Prox Cx lesion is 60% stenosed. Mid Cx lesion is 40% stenosed.   1. Significant underlying three-vessel coronary artery disease with chronically occluded mid LAD, patent first diagonal stent, patent RCA stents with mild in-stent restenosis, patent left circumflex stent with moderate in-stent restenosis.  Progression of ostial left circumflex stenosis to 60%. 2.  Left ventricular angiography was not performed due to chronic kidney disease. 3.  Moderately elevated left ventricular end-diastolic pressure between 25 to 30 mmHg.  Admitted 03/24 d/t rotator cuff tear.   He presents today for a HF follow-up visit with a chief complaint of intermittent swelling in the legs at the end of the day. Says that they resolve overnight and he's wondering if he can take his torsemide as needed and, if so, he will need a new RX. Denies any SOB, fatigue, chest pain, palpitations, abdominal distention, dizziness, weight gain or difficulty sleeping.   ROS: All systems negative except as listed in HPI, PMH and Problem List.  SH:  Social History   Socioeconomic History   Marital status: Legally Separated    Spouse name: Stanton Kidney    Number of children: 2   Years of education: Not on file   Highest education level: Not on file  Occupational History   Occupation: retired   Tobacco Use    Smoking status: Former    Current packs/day: 0.00    Average packs/day: 0.5 packs/day for 42.0 years (21.0 ttl pk-yrs)    Types: Cigarettes    Start date: 11/09/1977    Quit date: 11/10/2019    Years since quitting: 3.2   Smokeless tobacco: Never  Vaping Use   Vaping status: Never Used  Substance and Sexual Activity   Alcohol use: Yes    Alcohol/week: 6.0 standard drinks of alcohol    Types: 6 Cans of beer per week    Comment: per week   Drug use: No   Sexual activity: Not on file  Other Topics Concern   Not on file  Social History Narrative   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    Lives in [place]    2 kids daughters    Gets regular exercise   Smoker   [employer]    Social Determinants of Health   Financial Resource Strain: Low Risk  (05/02/2022)   Overall Financial Resource Strain (CARDIA)    Difficulty of Paying Living Expenses: Not hard at all  Food Insecurity: No Food Insecurity (05/02/2022)   Hunger Vital Sign    Worried About Running Out of Food in the Last Year: Never true    Ran Out of Food in the Last Year: Never true  Transportation Needs: No Transportation Needs (05/02/2022)   PRAPARE - Transportation    Lack of  Transportation (Medical): No    Lack of Transportation (Non-Medical): No  Physical Activity: Insufficiently Active (05/02/2022)   Exercise Vital Sign    Days of Exercise per Week: 4 days    Minutes of Exercise per Session: 20 min  Stress: No Stress Concern Present (05/02/2022)   [employer] of Occupational Health - Occupational Stress Questionnaire    Feeling of Stress : Not at all  Social Connections: Unknown (05/02/2022)   Social Connection and Isolation Panel [NHANES]    Frequency of Communication with Friends and Family: Not on file    Frequency of Social Gatherings with Friends and Family: Not on file    Attends Religious Services: Not on file    Active Member of  Clubs or Organizations: Not on file    Attends Banker Meetings: Not on file    Marital Status: Married  Intimate Partner Violence: Not At Risk (05/02/2022)   Humiliation, Afraid, Rape, and Kick questionnaire    Fear of Current or Ex-Partner: No    Emotionally Abused: No    Physically Abused: No    Sexually Abused: No    FH:  Family History  Problem Relation Age of Onset   Cancer - Lung Mother    Hypertension Mother    Heart attack Mother    Cancer Father        larynx   Dementia Maternal Grandmother    Heart attack Paternal Grandfather    Colon cancer Neg Hx    Stomach cancer Neg Hx    Pancreatic cancer Neg Hx     Past Medical History:  Diagnosis Date   Acute anterior wall MI (HCC) 07/04/1991   a.) LHC: EF 29%; 100% mLAD -> PTCA performed   Acute cough 08/16/2022   Anxiety    Aortic atherosclerosis (HCC)    Baker's cyst 06/10/2019   5.3 cm 01/2018 noted [place] no repair as of 06/10/2019      Bilateral impacted cerumen 06/10/2019   Cellulitis of left foot 05/10/2022   CHF (congestive heart failure) (HCC)    COPD (chronic obstructive pulmonary disease) (HCC)    Coronary artery disease    a.) 3 stents --> 3x18 mm Duet to OM3, 2.5x18 mm Duet to D1, 2.5x13 mm Duet to RPDA. b. 06/2013 PCI: CTO LAD, patent LAD stent, RCA 70p (FFR 0.74--> DES), 56m (DES), RPDA 50%, EF 20%; c. 08/2019 Cath: LM nl, LAD 100p/m, D1 60, D2 patent stent, LCX 60ost/p, 73m ISR, RCA 30p, 62m ISR.   Dizziness 03/30/2015   Family history of lung cancer    HFrEF (heart failure with reduced ejection fraction) (HCC)    a. 05/2014 Echo: EF 25-30%; b. 01/2018 Echo ([facility]): EF 30%; c. 06/2021 Echo: EF 25-30%, apical AK, GrI DD, nl RV fxn.   History of prostate surgery 06/09/2020   History of ventricular tachycardia 01/23/2012   HLD (hyperlipidemia)    HTN (hypertension)    ICD  single,BSX    a. DOI 11/2011; b. S/N# 213086   Insomnia 06/01/2014   Ischemic cardiomyopathy    a. 05/2014 Echo: EF 25-30%;  b. 01/2018 Echo ([facility]): EF 30%, c. 06/2021 Echo: EF 25-30%.   Lateral wall myocardial infarction (HCC) 06/29/1997   a.) LHC: EF 44%; 75% RPDA and 95% OM3; PCI performed and a 3.0 x 18 mm Duet stent placed to OM3   Leukocytosis 08/16/2022   Long term current use of antithrombotics/antiplatelets    a.) DAPT therapy (ASA + clopidogrel)   NSTEMI (non-ST elevated  myocardial infarction) (HCC) 11/27/2011   a.) LHC: 100% mLAD; intervention deferred opting for medical management.   NSTEMI (non-ST elevated myocardial infarction) (HCC) 06/23/2016   Personal history of colonic polyps    Pneumonia 08/2018   Sacroiliac pain 07/31/2016   Formatting of this note might be different from the original. Added automatically from request for surgery [id number]   Sepsis (HCC) 07/29/2021   Sepsis due to group A Streptococcus with acute renal failure and septic shock (HCC)    Syncope and collapse    Syncope and collapse 09/26/2010   Qualifier: Diagnosis of   By: Mariah Milling MD, Tim       Tobacco abuse 06/13/2016   URI (upper respiratory infection) 03/17/2022   Ventricular tachycardia (HCC) 12/01/2011   a.) VT arrest; ROSC achieved --> [employer] AICD placed.    Current Outpatient Medications  Medication Sig Dispense Refill   albuterol (VENTOLIN HFA) 108 (90 Base) MCG/ACT inhaler Inhale 1-2 puffs into the lungs every 6 (six) hours as needed for wheezing or shortness of breath. 18 g 11   ALPRAZolam (XANAX) 0.5 MG tablet TAKE 1 TABLET BY MOUTH THREE TIMES DAILY AS NEEDED FOR ANXIETY 90 tablet 0   aspirin EC 81 MG tablet Take 81 mg by mouth daily. Swallow whole.     atorvastatin (LIPITOR) 80 MG tablet Take 1 tablet (80 mg total) by mouth daily. 90 tablet 3   carvedilol (COREG) 3.125 MG tablet Take 1 tablet (3.125 mg total) by mouth 2 (two) times daily. 180 tablet 3   clopidogrel (PLAVIX) 75 MG tablet Take 1 tablet (75 mg total) by mouth daily. 90 tablet 3   dapagliflozin propanediol (FARXIGA) 10 MG TABS tablet Take  1 tablet (10 mg total) by mouth daily before breakfast. 90 tablet 3   ezetimibe (ZETIA) 10 MG tablet Take 1 tablet (10 mg total) by mouth daily. 90 tablet 3   ipratropium-albuterol (DUONEB) 0.5-2.5 (3) MG/3ML SOLN Take 3 mLs by nebulization every 6 (six) hours as needed. 360 mL 11   potassium chloride (KLOR-CON 10) 10 MEQ tablet Take 1 tablet (10 mEq total) by mouth daily. 90 tablet 3   sacubitril-valsartan (ENTRESTO) 49-51 MG Take 1 tablet by mouth 2 (two) times daily. 180 tablet 3   spironolactone (ALDACTONE) 25 MG tablet Take 1 tablet (25 mg total) by mouth daily. 90 tablet 3   tadalafil (CIALIS) 20 MG tablet Take 0.5-1 tablets (10-20 mg total) by mouth daily as needed for erectile dysfunction (take 1 hour prior to sexual activity). 30 tablet 11   Tiotropium Bromide-Olodaterol (STIOLTO RESPIMAT) 2.5-2.5 MCG/ACT AERS Inhale 2 puffs into the lungs daily. 4 g 11   No current facility-administered medications for this visit.   Vitals:   02/14/23 1107  BP: 117/63  Pulse: (!) 57  SpO2: 100%  Weight: 200 lb 3.2 oz (90.8 kg)   Wt Readings from Last 3 Encounters:  02/14/23 200 lb 3.2 oz (90.8 kg)  12/25/22 205 lb (93 kg)  12/24/22 205 lb (93 kg)   Lab Results  Component Value Date   CREATININE 1.19 12/11/2022   CREATININE 1.6 (A) 09/18/2022   CREATININE 1.08 08/07/2022   PHYSICAL EXAM:  General:  Well appearing. No resp difficulty HEENT: normal Neck: supple. JVP flat. No lymphadenopathy or thryomegaly appreciated. Cor: PMI normal. Regular rate & rhythm. No rubs, gallops or murmurs. Lungs: clear Abdomen: soft, nontender, nondistended. No hepatosplenomegaly. No bruits or masses.  Extremities: no cyanosis, clubbing, rash, trace pitting edema in bilateral lower legs  Neuro: alert & orientedx3, cranial nerves grossly intact. Moves all 4 extremities w/o difficulty. Affect pleasant.   ECG: not done   ASSESSMENT & PLAN:  1: Ischemic heart failure with reduced ejection fraction- - cath  showed multivessel disease - NYHA class I - euvolemic today - weighing daily; reminded to call for an overnight weight gain of > 2 pounds or a weekly weight gain of >5 pounds - weight down 5 pounds from last visit here 3 months ago - Echo 07/12/21: EF of 25-30% - Echo 09/18/22: EF 25%.   - not adding salt and has been reading food labels for sodium content - continue carvedilol 3.125mg  BID - continue spironolactone 25mg  daily - continue farxiga 10mg  daily - continue entresto 49/51mg  BID - resume torsemide 20mg  PRN; if he takes the torsemide, he needs to take 1 additional potassium tablet with it - BNP 08/07/22 was 110.3  2: HTN- - BP 117/63 - saw PCP Clent Ridges) 05/24 - BMP 09/18/22 reviewed and showed sodium 133, potassium 3.8, creatinine 1.6  3: VT- - saw EP Graciela Husbands) 05/25 - has AICD present  4: CAD- - saw cardiology Mariah Milling) 03/24 - continue ASA 81mg  daily - continue atorvastatin 80mg  daily - LDL 12/11/22 was 60 - LHC done 08/30/19 showed: Prox RCA to Mid RCA lesion is 20% stenosed. Prox RCA lesion is 30% stenosed. Prox LAD to Mid LAD lesion is 100% stenosed. 1st Diag-1 lesion is 60% stenosed. Previously placed 1st Diag-2 stent (unknown type) is widely patent. Ost Cx to Prox Cx lesion is 60% stenosed. Mid Cx lesion is 40% stenosed.  Return PRN and emphasized that he follow closely with cardiology. He was comfortable with this plan.

## 2023-02-14 NOTE — Patient Instructions (Addendum)
If you take the torsemide, take an additional potassium tablet with it.   Call us in the future if you need [place] for anything

## 2023-02-18 ENCOUNTER — Encounter: Payer: Self-pay | Admitting: Internal Medicine

## 2023-03-03 ENCOUNTER — Other Ambulatory Visit: Payer: Self-pay

## 2023-03-03 ENCOUNTER — Telehealth: Payer: Self-pay | Admitting: Cardiovascular Disease

## 2023-03-03 DIAGNOSIS — E876 Hypokalemia: Secondary | ICD-10-CM

## 2023-03-03 MED ORDER — POTASSIUM CHLORIDE ER 10 MEQ PO TBCR
10.0000 meq | EXTENDED_RELEASE_TABLET | Freq: Every day | ORAL | 3 refills | Status: DC
Start: 2023-03-03 — End: 2023-06-30

## 2023-03-03 NOTE — Telephone Encounter (Signed)
Disp Refills Start End   potassium chloride (KLOR-CON 10) 10 MEQ tablet 90 tablet 3 03/03/2023 --   Sig - Route: Take 1 tablet (10 mEq total) by mouth daily. - Oral   Sent to pharmacy as: potassium chloride (KLOR-CON 10) 10 MEQ tablet   E-Prescribing Status: Receipt confirmed by pharmacy (03/03/2023 12:56 PM EDT)

## 2023-03-03 NOTE — Telephone Encounter (Signed)
*  STAT* If patient is at the pharmacy, call can be transferred to refill team.   1. Which medications need to be refilled? (please list name of each medication and dose if known) potassium chloride (KLOR-CON 10) 10 MEQ tablet  2. Which pharmacy/location (including street and city if local pharmacy) is medication to be sent to? [id number] - [place], [place] - [address]    3. Do they need a 30 day or 90 day supply?  90 day supply

## 2023-03-10 ENCOUNTER — Ambulatory Visit (INDEPENDENT_AMBULATORY_CARE_PROVIDER_SITE_OTHER): Payer: Medicare Other

## 2023-03-10 DIAGNOSIS — I472 Ventricular tachycardia, unspecified: Secondary | ICD-10-CM | POA: Diagnosis not present

## 2023-03-11 LAB — CUP PACEART REMOTE DEVICE CHECK
Battery Remaining Longevity: 18 mo
Battery Remaining Percentage: 20 %
Brady Statistic RV Percent Paced: 0 %
Date Time Interrogation Session: 20240813022100
HighPow Impedance: 98 Ohm
Implantable Lead Connection Status: 753985
Implantable Lead Implant Date: 20130503
Implantable Lead Location: 753860
Implantable Lead Model: 292
Implantable Lead Serial Number: 112568
Implantable Pulse Generator Implant Date: 20130503
Lead Channel Impedance Value: 674 Ohm
Lead Channel Pacing Threshold Amplitude: 0.5 V
Lead Channel Pacing Threshold Pulse Width: 0.5 ms
Lead Channel Setting Pacing Amplitude: 2.4 V
Lead Channel Setting Pacing Pulse Width: 0.5 ms
Lead Channel Setting Sensing Sensitivity: 0.6 mV
Pulse Gen Serial Number: 105232
Zone Setting Status: 755011

## 2023-03-14 ENCOUNTER — Telehealth: Payer: Self-pay | Admitting: Family

## 2023-03-14 DIAGNOSIS — F39 Unspecified mood [affective] disorder: Secondary | ICD-10-CM

## 2023-03-19 ENCOUNTER — Other Ambulatory Visit: Payer: Self-pay

## 2023-03-19 DIAGNOSIS — F39 Unspecified mood [affective] disorder: Secondary | ICD-10-CM

## 2023-03-19 MED ORDER — ALPRAZOLAM 0.5 MG PO TABS: 0.5000 mg | ORAL_TABLET | Freq: Three times a day (TID) | ORAL | 0 refills | Status: DC | PRN

## 2023-03-19 NOTE — Telephone Encounter (Signed)
Patient just called checking on his refill for prescription ALPRAZolam (XANAX) 0.5 MG tablet. His pharmacy he uses is [facility] [address] [place] - [address] [place], [place] [address] Phone: [phone]  Fax: [phone]

## 2023-03-19 NOTE — Telephone Encounter (Signed)
Refill request sent to provider.

## 2023-03-20 ENCOUNTER — Other Ambulatory Visit: Payer: Self-pay

## 2023-03-20 DIAGNOSIS — E785 Hyperlipidemia, unspecified: Secondary | ICD-10-CM

## 2023-03-20 MED ORDER — EZETIMIBE 10 MG PO TABS: 10.0000 mg | ORAL_TABLET | Freq: Every day | ORAL | 3 refills | Status: DC

## 2023-03-24 NOTE — Progress Notes (Signed)
Remote ICD transmission.   

## 2023-04-10 ENCOUNTER — Ambulatory Visit (INDEPENDENT_AMBULATORY_CARE_PROVIDER_SITE_OTHER): Payer: Medicare Other | Admitting: Nurse Practitioner

## 2023-04-10 VITALS — BP 106/66 | HR 64 | Resp 19 | Ht 68.0 in | Wt 196.0 lb

## 2023-04-10 DIAGNOSIS — E782 Mixed hyperlipidemia: Secondary | ICD-10-CM | POA: Diagnosis not present

## 2023-04-10 DIAGNOSIS — I10 Essential (primary) hypertension: Secondary | ICD-10-CM | POA: Diagnosis not present

## 2023-04-10 DIAGNOSIS — I6521 Occlusion and stenosis of right carotid artery: Secondary | ICD-10-CM | POA: Diagnosis not present

## 2023-04-11 ENCOUNTER — Encounter (INDEPENDENT_AMBULATORY_CARE_PROVIDER_SITE_OTHER): Payer: Self-pay | Admitting: Nurse Practitioner

## 2023-04-11 NOTE — Progress Notes (Signed)
Subjective:    Patient ID: Ian Mills, male    DOB: 1956-04-02, 67 y.o.   MRN: [id number] Chief Complaint  Patient presents with   Venous Insufficiency    Ian Mills is a 67 year old male who presents to the office today in order for evaluation of his chronic duplex however no carotid duplex was scheduled today.  Due to time constraints and the patient's schedule or unable to move forward with getting his duplex scheduled today.  However in the interim he notes that he has not had any worsening or developing symptoms such as pyrosis fugax TIA strokelike symptoms.  He denies any worsening peripheral arterial disease such as claudication or rest pain.    Review of Systems  All other systems reviewed and are negative.      Objective:   Physical Exam Vitals reviewed.  HENT:     Head: Normocephalic.  Cardiovascular:     Rate and Rhythm: Normal rate.     Pulses: Normal pulses.  Pulmonary:     Effort: Pulmonary effort is normal.  Skin:    General: Skin is warm and dry.  Neurological:     Mental Status: He is alert and oriented to person, place, and time.  Psychiatric:        Mood and Affect: Mood normal.        Behavior: Behavior normal.        Thought Content: Thought content normal.        Judgment: Judgment normal.     BP 106/66 (BP Location: Left Arm)   Pulse 64   Resp 19   Ht 5\' 8"  (1.727 m)   Wt 196 lb (88.9 kg)   BMI 29.80 kg/m   Past Medical History:  Diagnosis Date   Acute anterior wall MI (HCC) 07/04/1991   a.) LHC: EF 29%; 100% mLAD -> PTCA performed   Acute cough 08/16/2022   Anxiety    Aortic atherosclerosis (HCC)    Baker's cyst 06/10/2019   5.3 cm 01/2018 noted [place] no repair as of 06/10/2019      Bilateral impacted cerumen 06/10/2019   Cellulitis of left foot 05/10/2022   CHF (congestive heart failure) (HCC)    COPD (chronic obstructive pulmonary disease) (HCC)    Coronary artery disease    a.) 3 stents --> 3x18 mm Duet to OM3, 2.5x18 mm Duet  to D1, 2.5x13 mm Duet to RPDA. b. 06/2013 PCI: CTO LAD, patent LAD stent, RCA 70p (FFR 0.74--> DES), 59m (DES), RPDA 50%, EF 20%; c. 08/2019 Cath: LM nl, LAD 100p/m, D1 60, D2 patent stent, LCX 60ost/p, 72m ISR, RCA 30p, 49m ISR.   Dizziness 03/30/2015   Family history of lung cancer    HFrEF (heart failure with reduced ejection fraction) (HCC)    a. 05/2014 Echo: EF 25-30%; b. 01/2018 Echo ([facility]): EF 30%; c. 06/2021 Echo: EF 25-30%, apical AK, GrI DD, nl RV fxn.   History of prostate surgery 06/09/2020   History of ventricular tachycardia 01/23/2012   HLD (hyperlipidemia)    HTN (hypertension)    ICD  single,BSX    a. DOI 11/2011; b. S/N# [id number]   Insomnia 06/01/2014   Ischemic cardiomyopathy    a. 05/2014 Echo: EF 25-30%; b. 01/2018 Echo ([facility]): EF 30%, c. 06/2021 Echo: EF 25-30%.   Lateral wall myocardial infarction (HCC) 06/29/1997   a.) LHC: EF 44%; 75% RPDA and 95% OM3; PCI performed and a 3.0 x 18 mm Duet stent placed to OM3  Leukocytosis 08/16/2022   Long term current use of antithrombotics/antiplatelets    a.) DAPT therapy (ASA + clopidogrel)   NSTEMI (non-ST elevated myocardial infarction) (HCC) 11/27/2011   a.) LHC: 100% mLAD; intervention deferred opting for medical management.   NSTEMI (non-ST elevated myocardial infarction) (HCC) 06/23/2016   Personal history of colonic polyps    Pneumonia 08/2018   Sacroiliac pain 07/31/2016   Formatting of this note might be different from the original. Added automatically from request for surgery [phone]   Sepsis (HCC) 07/29/2021   Sepsis due to group A Streptococcus with acute renal failure and septic shock (HCC)    Syncope and collapse    Syncope and collapse 09/26/2010   Qualifier: Diagnosis of   By: Mariah Milling MD, Tim       Tobacco abuse 06/13/2016   URI (upper respiratory infection) 03/17/2022   Ventricular tachycardia (HCC) 12/01/2011   a.) VT arrest; ROSC achieved --> [employer] AICD placed.    Social History    Socioeconomic History   Marital status: Legally Separated    Spouse name: Stanton Kidney    Number of children: 2   Years of education: Not on file   Highest education level: Not on file  Occupational History   Occupation: retired   Tobacco Use   Smoking status: Former    Current packs/day: 0.00    Average packs/day: 0.5 packs/day for 42.0 years (21.0 ttl pk-yrs)    Types: Cigarettes    Start date: 11/09/1977    Quit date: 11/10/2019    Years since quitting: 3.4   Smokeless tobacco: Never  Vaping Use   Vaping status: Never Used  Substance and Sexual Activity   Alcohol use: Yes    Alcohol/week: 6.0 standard drinks of alcohol    Types: 6 Cans of beer per week    Comment: per week   Drug use: No   Sexual activity: Not on file  Other Topics Concern   Not on file  Social History Narrative   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    Lives in [place]    2 kids daughters    Gets regular exercise   Smoker   [employer]    Social Determinants of Health   Financial Resource Strain: Low Risk  (05/02/2022)   Overall Financial Resource Strain (CARDIA)    Difficulty of Paying Living Expenses: Not hard at all  Food Insecurity: No Food Insecurity (05/02/2022)   Hunger Vital Sign    Worried About Running Out of Food in the Last Year: Never true    Ran Out of Food in the Last Year: Never true  Transportation Needs: No Transportation Needs (05/02/2022)   PRAPARE - Administrator, Civil Service (Medical): No    Lack of Transportation (Non-Medical): No  Physical Activity: Insufficiently Active (05/02/2022)   Exercise Vital Sign    Days of Exercise per Week: 4 days    Minutes of Exercise per Session: 20 min  Stress: No Stress Concern Present (05/02/2022)   [employer] of Occupational Health - Occupational Stress Questionnaire    Feeling of Stress : Not at all  Social Connections: Unknown (05/02/2022)   Social  Connection and Isolation Panel [NHANES]    Frequency of Communication with Friends and Family: Not on file    Frequency of Social Gatherings with Friends and Family: Not on file    Attends Religious Services: Not  on file    Active Member of Clubs or Organizations: Not on file    Attends Club or Organization Meetings: Not on file    Marital Status: Married  Intimate Partner Violence: Not At Risk (05/02/2022)   Humiliation, Afraid, Rape, and Kick questionnaire    Fear of Current or Ex-Partner: No    Emotionally Abused: No    Physically Abused: No    Sexually Abused: No    Past Surgical History:  Procedure Laterality Date   APPENDECTOMY N/A    CARDIAC DEFIBRILLATOR PLACEMENT N/A 12/01/2011   COLONOSCOPY WITH PROPOFOL N/A 12/22/2020   Procedure: COLONOSCOPY WITH PROPOFOL;  Surgeon: Wyline Mood, MD;  Location: [facility] ENDOSCOPY;  Service: Gastroenterology;  Laterality: N/A;   CORONARY ANGIOPLASTY Left 07/05/1991   Procedure: CORONARY ANGIOPLASTY; Location: [facility]; Surgeon: Eugenia Pancoast, MD   CORONARY ANGIOPLASTY Left 08/06/1991   Procedure: CORONARY ANGIOPLASTY; Location: [facility]; Surgeon: Ned Clines, MD   CORONARY ANGIOPLASTY Left 08/09/1991   Procedure: CORONARY ANGIOPLASTY; Location: [facility]; Surgeon: Ned Clines, MD   CORONARY ANGIOPLASTY Left 10/28/1991   Procedure: CORONARY ANGIOPLASTY; Location: [facility]; Surgeon: Ned Clines, MD   CORONARY ANGIOPLASTY Left 11/02/1991   Procedure: CORONARY ANGIOPLASTY; Location: [facility]; Surgeon: Ned Clines, MD   CORONARY ANGIOPLASTY Left 01/10/1992   Procedure: CORONARY ANGIOPLASTY; Location: [facility]; Surgeon: Philomena Course, MD   CORONARY ANGIOPLASTY Left 09/26/1993   Procedure: CORONARY ANGIOPLASTY; Location: [facility]; Surgeon: Theone Murdoch, MD   CORONARY ANGIOPLASTY Left 09/11/2006   Procedure: CORONARY ANGIOPLASTY; Location: [facility]   CORONARY ANGIOPLASTY Left 01/17/2010   Procedure: CORONARY ANGIOPLASTY; Location: [facility]; Surgeon: Freddi Che, MD    CORONARY ANGIOPLASTY Left 11/27/2011   Procedure: CORONARY ANGIOPLASTY; Location: [facility]; Surgeon: Nolon Rod, MD   CORONARY ANGIOPLASTY WITH STENT PLACEMENT Left 06/29/1997   Procedure: CORONARY ANGIOPLASTY WITH STENT PLACEMENT (3.0 x 18 mm Diet stent to OM3); Location: [facility]; Surgeon: Samuella Cota, MD   CORONARY ANGIOPLASTY WITH STENT PLACEMENT Left 07/12/1998   Procedure: CORONARY ANGIOPLASTY WITH STENT PLACEMENT (2.8 x 8 mm Duet stent to D1, 2.5 x 13 mm Duet stent to RPDA); Location: [facility]   GRAFT APPLICATION Left 08/24/2021   Procedure: GRAFT APPLICATION;  Surgeon: Sung Amabile, DO;  Location: [facility] ORS;  Service: General;  Laterality: Left;   LEFT HEART CATH AND CORONARY ANGIOGRAPHY Left 08/30/2019   Procedure: LEFT HEART CATH AND CORONARY ANGIOGRAPHY;  Surgeon: Iran Ouch, MD;  Location: [facility] INVASIVE CV LAB;  Service: Cardiovascular;  Laterality: Left;   PROSTATE SURGERY     urethra grew around prostate    TUMOR REMOVAL  2001   chest thymic cyst; behind lungs [facility] benign     Family History  Problem Relation Age of Onset   Cancer - Lung Mother    Hypertension Mother    Heart attack Mother    Cancer Father        larynx   Dementia Maternal Grandmother    Heart attack Paternal Grandfather    Colon cancer Neg Hx    Stomach cancer Neg Hx    Pancreatic cancer Neg Hx     Allergies  Allergen Reactions   Nitroglycerin Nausea Only and Other (See Comments)    Per patient "causes severe  headache"       Latest Ref Rng & Units 09/18/2022   12:00 AM 08/15/2022    1:56 PM 08/07/2022   10:52 AM  CBC  WBC  7.9     10.5  13.2   Hemoglobin 13.5 - 17.5 15.4  15.5  15.9   Hematocrit 41 - 53 46     47.6  50.6   Platelets 150 - 400 K/uL 176     220.0  196      This result is from an external source.      CMP     Component Value Date/Time   NA 138 12/11/2022 1128   NA 133 (A) 09/18/2022 0000   NA 137 05/30/2014 0455   K 4.5 12/11/2022 1128   K 3.9 05/30/2014 0455   CL 108  12/11/2022 1128   CL 106 05/30/2014 0455   CO2 22 12/11/2022 1128   CO2 26 05/30/2014 0455   GLUCOSE 108 (H) 12/11/2022 1128   GLUCOSE 104 (H) 05/30/2014 0455   BUN 29 (H) 12/11/2022 1128   BUN 27 (A) 09/18/2022 0000   BUN 12 05/30/2014 0455   CREATININE 1.19 12/11/2022 1128   CREATININE 1.33 (H) 06/09/2020 1447   CREATININE 1.33 (H) 06/09/2020 1447   CALCIUM 9.3 12/11/2022 1128   CALCIUM 8.1 (L) 05/30/2014 0455   PROT 7.9 06/25/2022 1340   PROT 6.1 04/06/2021 1407   PROT 7.2 05/29/2014 1136   ALBUMIN 4.1 06/25/2022 1340   ALBUMIN 3.8 04/06/2021 1407   ALBUMIN 3.6 05/29/2014 1136   AST 18 06/25/2022 1340   AST 21 05/29/2014 1136   ALT 24 06/25/2022 1340   ALT 24 05/29/2014 1136   ALKPHOS 93 06/25/2022 1340   ALKPHOS 116 05/29/2014 1136   BILITOT 1.0 06/25/2022 1340   BILITOT 0.4 04/06/2021 1407   BILITOT 0.5 05/29/2014 1136   GFR 52.53 (L) 02/27/2022 1418   EGFR 59 (L) 10/03/2021 1139   GFRNONAA >60 12/11/2022 1128   GFRNONAA 55 (L) 06/09/2020 1447     No results found.     Assessment & Plan:   1. Carotid stenosis, right Patient will return in the next few weeks in order to undergo carotid duplex.  Patient will continue with conservative therapy including aspirin and Plavix  2. Essential hypertension Continue antihypertensive medications as already ordered, these medications have been reviewed and there are no changes at this time.  3. Mixed hyperlipidemia Continue statin as ordered and reviewed, no changes at this time   Current Outpatient Medications on File Prior to Visit  Medication Sig Dispense Refill   albuterol (VENTOLIN HFA) 108 (90 Base) MCG/ACT inhaler Inhale 1-2 puffs into the lungs every 6 (six) hours as needed for wheezing or shortness of breath. 18 g 11   ALPRAZolam (XANAX) 0.5 MG tablet Take 1 tablet (0.5 mg total) by mouth 3 (three) times daily as needed. for anxiety 90 tablet 0   aspirin EC 81 MG tablet Take 81 mg by mouth daily. Swallow whole.      atorvastatin (LIPITOR) 80 MG tablet Take 1 tablet (80 mg total) by mouth daily. 90 tablet 3   carvedilol (COREG) 3.125 MG tablet Take 1 tablet (3.125 mg total) by mouth 2 (two) times daily. 180 tablet 3   clopidogrel (PLAVIX) 75 MG tablet Take 1 tablet (75 mg total) by mouth daily. 90 tablet 3   dapagliflozin propanediol (FARXIGA) 10 MG TABS tablet Take 1 tablet (10 mg total) by mouth daily before breakfast. 90 tablet 3   ezetimibe (ZETIA) 10 MG tablet Take 1 tablet (10 mg total) by mouth daily. 90 tablet 3   ipratropium-albuterol (DUONEB) 0.5-2.5 (3) MG/3ML SOLN Take 3 mLs by nebulization every 6 (six) hours as needed. 360 mL 11   potassium chloride (KLOR-CON 10)  10 MEQ tablet Take 1 tablet (10 mEq total) by mouth daily. 90 tablet 3   sacubitril-valsartan (ENTRESTO) 49-51 MG Take 1 tablet by mouth 2 (two) times daily. 180 tablet 3   spironolactone (ALDACTONE) 25 MG tablet Take 1 tablet (25 mg total) by mouth daily. 90 tablet 3   tadalafil (CIALIS) 20 MG tablet Take 0.5-1 tablets (10-20 mg total) by mouth daily as needed for erectile dysfunction (take 1 hour prior to sexual activity). 30 tablet 11   Tiotropium Bromide-Olodaterol (STIOLTO RESPIMAT) 2.5-2.5 MCG/ACT AERS Inhale 2 puffs into the lungs daily. 4 g 11   torsemide (DEMADEX) 20 MG tablet Take 1 tablet (20 mg total) by mouth as needed. 180 tablet 3   No current facility-administered medications on file prior to visit.    There are no Patient Instructions on file for this visit. No follow-ups on file.   Georgiana Spinner, NP

## 2023-04-14 DIAGNOSIS — F17210 Nicotine dependence, cigarettes, uncomplicated: Secondary | ICD-10-CM | POA: Diagnosis not present

## 2023-04-14 DIAGNOSIS — M47816 Spondylosis without myelopathy or radiculopathy, lumbar region: Secondary | ICD-10-CM | POA: Diagnosis not present

## 2023-04-15 ENCOUNTER — Other Ambulatory Visit: Payer: Self-pay | Admitting: Family Medicine

## 2023-04-15 DIAGNOSIS — F39 Unspecified mood [affective] disorder: Secondary | ICD-10-CM

## 2023-04-18 ENCOUNTER — Other Ambulatory Visit: Payer: Self-pay

## 2023-04-18 DIAGNOSIS — E782 Mixed hyperlipidemia: Secondary | ICD-10-CM

## 2023-04-18 DIAGNOSIS — I10 Essential (primary) hypertension: Secondary | ICD-10-CM

## 2023-04-18 MED ORDER — ATORVASTATIN CALCIUM 80 MG PO TABS
80.0000 mg | ORAL_TABLET | Freq: Every day | ORAL | 3 refills | Status: DC
Start: 2023-04-18 — End: 2023-06-30

## 2023-04-18 NOTE — Telephone Encounter (Signed)
LOV 12/25/2022 NOV Per Dr. Clent Ridges 3 months for video visit in 6 months office visit

## 2023-04-28 ENCOUNTER — Ambulatory Visit: Payer: Medicare Other | Admitting: Cardiology

## 2023-04-28 DIAGNOSIS — M48061 Spinal stenosis, lumbar region without neurogenic claudication: Secondary | ICD-10-CM | POA: Diagnosis not present

## 2023-04-28 DIAGNOSIS — M5136 Other intervertebral disc degeneration, lumbar region: Secondary | ICD-10-CM | POA: Diagnosis not present

## 2023-04-28 DIAGNOSIS — N132 Hydronephrosis with renal and ureteral calculous obstruction: Secondary | ICD-10-CM | POA: Diagnosis not present

## 2023-04-28 DIAGNOSIS — M47816 Spondylosis without myelopathy or radiculopathy, lumbar region: Secondary | ICD-10-CM | POA: Diagnosis not present

## 2023-04-29 ENCOUNTER — Other Ambulatory Visit: Payer: Self-pay

## 2023-04-29 DIAGNOSIS — I25118 Atherosclerotic heart disease of native coronary artery with other forms of angina pectoris: Secondary | ICD-10-CM

## 2023-04-29 DIAGNOSIS — I10 Essential (primary) hypertension: Secondary | ICD-10-CM

## 2023-04-30 MED ORDER — CARVEDILOL 3.125 MG PO TABS
3.1250 mg | ORAL_TABLET | Freq: Two times a day (BID) | ORAL | 0 refills | Status: DC
Start: 2023-04-30 — End: 2023-06-09

## 2023-05-01 ENCOUNTER — Telehealth: Payer: Self-pay | Admitting: Cardiovascular Disease

## 2023-05-01 NOTE — Telephone Encounter (Signed)
Outpatient Medication Detail Refill sent in by Clarisa Kindred, FNP on 04/30/2023.   Disp Refills Start End   carvedilol (COREG) 3.125 MG tablet 60 tablet 0 04/30/2023 --   Sig - Route: Take 1 tablet (3.125 mg total) by mouth 2 (two) times daily. - Oral   Sent to pharmacy as: carvedilol (COREG) 3.125 MG tablet   Notes to Pharmacy: Please place on hold if medication is pending pick up.   E-Prescribing Status: Receipt confirmed by pharmacy (04/30/2023 10:12 AM EDT)

## 2023-05-01 NOTE — Telephone Encounter (Signed)
*  STAT* If patient is at the pharmacy, call can be transferred to refill team.   1. Which medications need to be refilled? (please list name of each medication and dose if known)   carvedilol (COREG) 3.125 MG tablet    2. Would you like to learn more about the convenience, safety, & potential cost savings by using the [facility]?   3. Are you open to using the [facility] (Type [facility]. ).  4. Which pharmacy/location (including street and city if local pharmacy) is medication to be sent to?  [facility] - [place], [place] - [address]   5. Do they need a 30 day or 90 day supply?   90 day  Patient stated he is completely out of this medication.   Patient has appointment scheduled on 11/5.

## 2023-05-05 ENCOUNTER — Other Ambulatory Visit (HOSPITAL_COMMUNITY): Payer: Self-pay

## 2023-05-06 ENCOUNTER — Ambulatory Visit (INDEPENDENT_AMBULATORY_CARE_PROVIDER_SITE_OTHER): Payer: Medicare Other | Admitting: Nurse Practitioner

## 2023-05-06 ENCOUNTER — Ambulatory Visit (INDEPENDENT_AMBULATORY_CARE_PROVIDER_SITE_OTHER): Payer: Medicare Other

## 2023-05-06 ENCOUNTER — Encounter (INDEPENDENT_AMBULATORY_CARE_PROVIDER_SITE_OTHER): Payer: Self-pay | Admitting: Nurse Practitioner

## 2023-05-06 VITALS — BP 122/66 | HR 71 | Resp 16 | Wt 199.8 lb

## 2023-05-06 DIAGNOSIS — I6523 Occlusion and stenosis of bilateral carotid arteries: Secondary | ICD-10-CM

## 2023-05-06 DIAGNOSIS — E782 Mixed hyperlipidemia: Secondary | ICD-10-CM

## 2023-05-06 DIAGNOSIS — I10 Essential (primary) hypertension: Secondary | ICD-10-CM

## 2023-05-07 ENCOUNTER — Encounter (INDEPENDENT_AMBULATORY_CARE_PROVIDER_SITE_OTHER): Payer: Self-pay | Admitting: Nurse Practitioner

## 2023-05-07 ENCOUNTER — Other Ambulatory Visit (HOSPITAL_COMMUNITY): Payer: Self-pay

## 2023-05-07 ENCOUNTER — Telehealth: Payer: Self-pay

## 2023-05-07 ENCOUNTER — Ambulatory Visit (INDEPENDENT_AMBULATORY_CARE_PROVIDER_SITE_OTHER): Payer: Medicare Other | Admitting: *Deleted

## 2023-05-07 VITALS — Ht 68.0 in | Wt 194.0 lb

## 2023-05-07 DIAGNOSIS — Z0000 Encounter for general adult medical examination without abnormal findings: Secondary | ICD-10-CM | POA: Diagnosis not present

## 2023-05-07 NOTE — Progress Notes (Signed)
Subjective:   Ian Mills is a 67 y.o. male who presents for Medicare Annual/Subsequent preventive examination.  Visit Complete: Virtual I connected with  Margot Ables on 05/07/23 by a audio enabled telemedicine application and verified that I am speaking with the correct person using two identifiers.  Patient Location: Home  Provider Location: Office/Clinic  I discussed the limitations of evaluation and management by telemedicine. The patient expressed understanding and agreed to proceed.  Vital Signs: Because this visit was a virtual/telehealth visit, some criteria may be missing or patient reported. Any vitals not documented were not able to be obtained and vitals that have been documented are patient reported.  Cardiac Risk Factors include: advanced age (>40men, >60 women);male gender;dyslipidemia;hypertension;Other (see comment), Risk factor comments: CAD     Objective:    Today's Vitals   05/07/23 1037 05/07/23 1038  Weight: 194 lb (88 kg)   Height: 5\' 8"  (1.727 m)   PainSc:  7    Body mass index is 29.5 kg/m.     05/07/2023   10:51 AM 08/07/2022   10:50 AM 05/02/2022    1:44 PM 09/05/2021    3:44 PM 08/24/2021    7:29 AM 07/30/2021    7:55 AM 07/29/2021    6:36 PM  Advanced Directives  Does Patient Have a Medical Advance Directive? Yes No Yes Yes No  No  Type of Estate agent of [place];Living will  Healthcare Power of [place];Living will Healthcare Power of [place];Living will     Does patient want to make changes to medical advance directive? No - Patient declined  No - Patient declined No - Patient declined     Copy of Healthcare Power of Attorney in Chart? Yes - validated most recent copy scanned in chart (See row information)  Yes - validated most recent copy scanned in chart (See row information) Yes - validated most recent copy scanned in chart (See row information)     Would patient like information on creating a medical advance  directive?     No - Patient declined No - Patient declined     Current Medications (verified) Outpatient Encounter Medications as of 05/07/2023  Medication Sig   albuterol (VENTOLIN HFA) 108 (90 Base) MCG/ACT inhaler Inhale 1-2 puffs into the lungs every 6 (six) hours as needed for wheezing or shortness of breath.   ALPRAZolam (XANAX) 0.5 MG tablet TAKE 1 TABLET BY MOUTH THREE TIMES DAILY AS NEEDED FOR ANXIETY   aspirin EC 81 MG tablet Take 81 mg by mouth daily. Swallow whole.   atorvastatin (LIPITOR) 80 MG tablet Take 1 tablet (80 mg total) by mouth daily.   carvedilol (COREG) 3.125 MG tablet Take 1 tablet (3.125 mg total) by mouth 2 (two) times daily.   clopidogrel (PLAVIX) 75 MG tablet Take 1 tablet (75 mg total) by mouth daily.   dapagliflozin propanediol (FARXIGA) 10 MG TABS tablet Take 1 tablet (10 mg total) by mouth daily before breakfast.   ezetimibe (ZETIA) 10 MG tablet Take 1 tablet (10 mg total) by mouth daily.   ipratropium-albuterol (DUONEB) 0.5-2.5 (3) MG/3ML SOLN Take 3 mLs by nebulization every 6 (six) hours as needed.   potassium chloride (KLOR-CON 10) 10 MEQ tablet Take 1 tablet (10 mEq total) by mouth daily.   sacubitril-valsartan (ENTRESTO) 49-51 MG Take 1 tablet by mouth 2 (two) times daily.   spironolactone (ALDACTONE) 25 MG tablet Take 1 tablet (25 mg total) by mouth daily.   tadalafil (CIALIS) 20 MG  tablet Take 0.5-1 tablets (10-20 mg total) by mouth daily as needed for erectile dysfunction (take 1 hour prior to sexual activity).   Tiotropium Bromide-Olodaterol (STIOLTO RESPIMAT) 2.5-2.5 MCG/ACT AERS Inhale 2 puffs into the lungs daily.   torsemide (DEMADEX) 20 MG tablet Take 1 tablet (20 mg total) by mouth as needed.   No facility-administered encounter medications on file as of 05/07/2023.    Allergies (verified) Nitroglycerin   History: Past Medical History:  Diagnosis Date   Acute anterior wall MI (HCC) 07/04/1991   a.) LHC: EF 29%; 100% mLAD -> PTCA  performed   Acute cough 08/16/2022   Anxiety    Aortic atherosclerosis (HCC)    Baker's cyst 06/10/2019   5.3 cm 01/2018 noted [place] no repair as of 06/10/2019      Bilateral impacted cerumen 06/10/2019   Cellulitis of left foot 05/10/2022   CHF (congestive heart failure) (HCC)    COPD (chronic obstructive pulmonary disease) (HCC)    Coronary artery disease    a.) 3 stents --> 3x18 mm Duet to OM3, 2.5x18 mm Duet to D1, 2.5x13 mm Duet to RPDA. b. 06/2013 PCI: CTO LAD, patent LAD stent, RCA 70p (FFR 0.74--> DES), 68m (DES), RPDA 50%, EF 20%; c. 08/2019 Cath: LM nl, LAD 100p/m, D1 60, D2 patent stent, LCX 60ost/p, 25m ISR, RCA 30p, 18m ISR.   Dizziness 03/30/2015   Family history of lung cancer    HFrEF (heart failure with reduced ejection fraction) (HCC)    a. 05/2014 Echo: EF 25-30%; b. 01/2018 Echo ([facility]): EF 30%; c. 06/2021 Echo: EF 25-30%, apical AK, GrI DD, nl RV fxn.   History of prostate surgery 06/09/2020   History of ventricular tachycardia 01/23/2012   HLD (hyperlipidemia)    HTN (hypertension)    ICD  single,BSX    a. DOI 11/2011; b. S/N# 161096   Insomnia 06/01/2014   Ischemic cardiomyopathy    a. 05/2014 Echo: EF 25-30%; b. 01/2018 Echo ([facility]): EF 30%, c. 06/2021 Echo: EF 25-30%.   Lateral wall myocardial infarction (HCC) 06/29/1997   a.) LHC: EF 44%; 75% RPDA and 95% OM3; PCI performed and a 3.0 x 18 mm Duet stent placed to OM3   Leukocytosis 08/16/2022   Long term current use of antithrombotics/antiplatelets    a.) DAPT therapy (ASA + clopidogrel)   NSTEMI (non-ST elevated myocardial infarction) (HCC) 11/27/2011   a.) LHC: 100% mLAD; intervention deferred opting for medical management.   NSTEMI (non-ST elevated myocardial infarction) (HCC) 06/23/2016   Personal history of colonic polyps    Pneumonia 08/2018   Sacroiliac pain 07/31/2016   Formatting of this note might be different from the original. Added automatically from request for surgery [id number]   Sepsis (HCC) 07/29/2021    Sepsis due to group A Streptococcus with acute renal failure and septic shock (HCC)    Syncope and collapse    Syncope and collapse 09/26/2010   Qualifier: Diagnosis of   By: Mariah Milling MD, Tim       Tobacco abuse 06/13/2016   URI (upper respiratory infection) 03/17/2022   Ventricular tachycardia (HCC) 12/01/2011   a.) VT arrest; ROSC achieved --> [employer] AICD placed.   Past Surgical History:  Procedure Laterality Date   APPENDECTOMY N/A    CARDIAC DEFIBRILLATOR PLACEMENT N/A 12/01/2011   COLONOSCOPY WITH PROPOFOL N/A 12/22/2020   Procedure: COLONOSCOPY WITH PROPOFOL;  Surgeon: Wyline Mood, MD;  Location: [facility] ENDOSCOPY;  Service: Gastroenterology;  Laterality: N/A;   CORONARY ANGIOPLASTY Left 07/05/1991  Procedure: CORONARY ANGIOPLASTY; Location: [facility]; Surgeon: Eugenia Pancoast, MD   CORONARY ANGIOPLASTY Left 08/06/1991   Procedure: CORONARY ANGIOPLASTY; Location: [facility]; Surgeon: Ned Clines, MD   CORONARY ANGIOPLASTY Left 08/09/1991   Procedure: CORONARY ANGIOPLASTY; Location: [facility]; Surgeon: Ned Clines, MD   CORONARY ANGIOPLASTY Left 10/28/1991   Procedure: CORONARY ANGIOPLASTY; Location: [facility]; Surgeon: Ned Clines, MD   CORONARY ANGIOPLASTY Left 11/02/1991   Procedure: CORONARY ANGIOPLASTY; Location: [facility]; Surgeon: Ned Clines, MD   CORONARY ANGIOPLASTY Left 01/10/1992   Procedure: CORONARY ANGIOPLASTY; Location: [facility]; Surgeon: Philomena Course, MD   CORONARY ANGIOPLASTY Left 09/26/1993   Procedure: CORONARY ANGIOPLASTY; Location: [facility]; Surgeon: Theone Murdoch, MD   CORONARY ANGIOPLASTY Left 09/11/2006   Procedure: CORONARY ANGIOPLASTY; Location: [facility]   CORONARY ANGIOPLASTY Left 01/17/2010   Procedure: CORONARY ANGIOPLASTY; Location: [facility]; Surgeon: Freddi Che, MD   CORONARY ANGIOPLASTY Left 11/27/2011   Procedure: CORONARY ANGIOPLASTY; Location: [facility]; Surgeon: Nolon Rod, MD   CORONARY ANGIOPLASTY WITH STENT PLACEMENT Left 06/29/1997   Procedure: CORONARY  ANGIOPLASTY WITH STENT PLACEMENT (3.0 x 18 mm Diet stent to OM3); Location: [facility]; Surgeon: Samuella Cota, MD   CORONARY ANGIOPLASTY WITH STENT PLACEMENT Left 07/12/1998   Procedure: CORONARY ANGIOPLASTY WITH STENT PLACEMENT (2.8 x 8 mm Duet stent to D1, 2.5 x 13 mm Duet stent to RPDA); Location: [facility]   GRAFT APPLICATION Left 08/24/2021   Procedure: GRAFT APPLICATION;  Surgeon: Sung Amabile, DO;  Location: [facility] ORS;  Service: General;  Laterality: Left;   LEFT HEART CATH AND CORONARY ANGIOGRAPHY Left 08/30/2019   Procedure: LEFT HEART CATH AND CORONARY ANGIOGRAPHY;  Surgeon: Iran Ouch, MD;  Location: [facility] INVASIVE CV LAB;  Service: Cardiovascular;  Laterality: Left;   PROSTATE SURGERY     urethra grew around prostate    TUMOR REMOVAL  2001   chest thymic cyst; behind lungs [facility] benign    Family History  Problem Relation Age of Onset   Cancer - Lung Mother    Hypertension Mother    Heart attack Mother    Cancer Father        larynx   Dementia Maternal Grandmother    Heart attack Paternal Grandfather    Colon cancer Neg Hx    Stomach cancer Neg Hx    Pancreatic cancer Neg Hx    Social History   Socioeconomic History   Marital status: Legally Separated    Spouse name: Stanton Kidney    Number of children: 2   Years of education: Not on file   Highest education level: Not on file  Occupational History   Occupation: retired   Tobacco Use   Smoking status: Former    Current packs/day: 0.00    Average packs/day: 0.5 packs/day for 42.0 years (21.0 ttl pk-yrs)    Types: Cigarettes    Start date: 11/09/1977    Quit date: 11/10/2019    Years since quitting: 3.4   Smokeless tobacco: Never  Vaping Use   Vaping status: Never Used  Substance and Sexual Activity   Alcohol use: Yes    Alcohol/week: 6.0 standard drinks of alcohol    Types: 6 Cans of beer per week    Comment: per week   Drug use: No   Sexual activity: Not on file  Other Topics Concern   Not on file  Social History  Narrative   Separated   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    Lives in [place]    2 kids daughters  Gets regular exercise   Smoker   [employer]    Social Determinants of Health   Financial Resource Strain: Low Risk  (05/07/2023)   Overall Financial Resource Strain (CARDIA)    Difficulty of Paying Living Expenses: Not hard at all  Food Insecurity: No Food Insecurity (05/07/2023)   Hunger Vital Sign    Worried About Running Out of Food in the Last Year: Never true    Ran Out of Food in the Last Year: Never true  Transportation Needs: No Transportation Needs (05/07/2023)   PRAPARE - Administrator, Civil Service (Medical): No    Lack of Transportation (Non-Medical): No  Physical Activity: Insufficiently Active (05/07/2023)   Exercise Vital Sign    Days of Exercise per Week: 7 days    Minutes of Exercise per Session: 20 min  Stress: No Stress Concern Present (05/07/2023)   [employer] of Occupational Health - Occupational Stress Questionnaire    Feeling of Stress : Only a little  Social Connections: Moderately Isolated (05/07/2023)   Social Connection and Isolation Panel [NHANES]    Frequency of Communication with Friends and Family: More than three times a week    Frequency of Social Gatherings with Friends and Family: More than three times a week    Attends Religious Services: More than 4 times per year    Active Member of [employer] or Organizations: No    Attends Banker Meetings: Never    Marital Status: Separated    Tobacco Counseling Counseling given: Not Answered   Clinical Intake:  Pre-visit preparation completed: Yes  Pain : 0-10 Pain Score: 7  Pain Type: Chronic pain Pain Location: Back Pain Orientation: Lower Pain Descriptors / Indicators: Nagging Pain Onset: More than a month ago Pain Frequency: Constant     BMI - recorded: 29.5 Nutritional Status: BMI 25 -29 Overweight Nutritional  Risks: None Diabetes: No  How often do you need to have someone help you when you read instructions, pamphlets, or other written materials from your doctor or pharmacy?: 1 - Never  Interpreter Needed?: No  Information entered by :: R. Cozy Veale LPN   Activities of Daily Living    05/07/2023   10:39 AM  In your present state of health, do you have any difficulty performing the following activities:  Hearing? 0  Vision? 0  Comment contacts  Difficulty concentrating or making decisions? 0  Walking or climbing stairs? 0  Dressing or bathing? 0  Doing errands, shopping? 0  Preparing Food and eating ? N  Using the Toilet? N  In the past six months, have you accidently leaked urine? N  Do you have problems with loss of bowel control? N  Managing your Medications? N  Managing your Finances? N  Housekeeping or managing your Housekeeping? N    Patient Care Team: Dana Allan, MD as PCP - General (Family Medicine) Antonieta Iba, MD as PCP - Cardiology (Cardiology) [facility] Salvia, MD as PCP - Electrophysiology (Cardiology) Antonieta Iba, MD as Consulting Physician (Cardiology)  Indicate any recent Medical Services you may have received from other than [facility] providers in the past year (date may be approximate).     Assessment:   This is a routine wellness examination for [place].  Hearing/Vision screen Hearing Screening - Comments:: No issues Vision Screening - Comments:: contacts   Goals Addressed             This Visit's Progress    Patient Stated  Wants to continue a good diet and walk more       Depression Screen    05/07/2023   10:46 AM 12/25/2022    2:32 PM 09/23/2022   10:53 AM 08/26/2022    2:52 PM 08/15/2022    1:31 PM 05/02/2022    1:43 PM 04/29/2022   11:11 AM  PHQ 2/9 Scores  PHQ - 2 Score 0 0 0 0 0 0 0  PHQ- 9 Score 1 0 0 0 0      Fall Risk    05/07/2023   10:41 AM 12/25/2022    2:32 PM 09/23/2022   10:53 AM 08/26/2022    2:51 PM 08/15/2022     1:30 PM  Fall Risk   Falls in the past year? 0 0 0 0 1  Number falls in past yr: 0 0 0 0 0  Injury with Fall? 0 0 0 0 0  Risk for fall due to : No Fall Risks No Fall Risks No Fall Risks No Fall Risks No Fall Risks  Follow up Falls prevention discussed;Falls evaluation completed  Falls evaluation completed  Falls evaluation completed    MEDICARE RISK AT HOME: Medicare Risk at Home Any stairs in or around the home?: Yes If so, are there any without handrails?: No Home free of loose throw rugs in walkways, pet beds, electrical cords, etc?: Yes Adequate lighting in your home to reduce risk of falls?: Yes Life alert?: No Use of a cane, walker or w/c?: No Grab bars in the bathroom?: No Shower chair or bench in shower?: No Elevated toilet seat or a handicapped toilet?: No   Cognitive Function:        05/07/2023   10:51 AM 05/02/2022    1:52 PM 04/13/2020   12:49 PM 04/13/2019   12:56 PM  6CIT Screen  What Year? 0 points 0 points 0 points 0 points  What month? 0 points 0 points 0 points 0 points  What time? 0 points 0 points 0 points 0 points  Count back from 20 0 points 0 points  0 points  Months in reverse 0 points 0 points  0 points  Repeat phrase 2 points 0 points    Total Score 2 points 0 points      Immunizations Immunization History  Administered Date(s) Administered   Fluad Quad(high Dose 65+) 06/10/2019, 06/09/2020, 04/29/2022   Influenza,inj,Quad PF,6+ Mos 06/26/2016   Influenza-Unspecified 06/29/2015   Moderna Covid-19 Vaccine Bivalent Booster 39yrs & up 12/15/2019, 01/12/2020, 05/30/2020   PFIZER(Purple Top)SARS-COV-2 Vaccination 12/22/2019, 01/12/2020, 06/02/2020   PNEUMOCOCCAL CONJUGATE-20 04/29/2022   Pneumococcal Polysaccharide-23 06/29/2015, 09/09/2018   Tdap 04/22/2019, 03/03/2020   Zoster Recombinant(Shingrix) 04/29/2022   Zoster, Live 12/29/2013    TDAP status: Up to date  Flu Vaccine status: Due, Education has been provided regarding the importance  of this vaccine. Advised may receive this vaccine at local pharmacy or Health Dept. Aware to provide a copy of the vaccination record if obtained from local pharmacy or Health Dept. Verbalized acceptance and understanding.  Pneumococcal vaccine status: Up to date  Covid-19 vaccine status: Information provided on how to obtain vaccines.   Qualifies for Shingles Vaccine? Yes   Zostavax completed Yes   Shingrix Completed?: No.    Education has been provided regarding the importance of this vaccine. Patient has been advised to call insurance company to determine out of pocket expense if they have not yet received this vaccine. Advised may also receive vaccine at local pharmacy or  Health Dept. Verbalized acceptance and understanding.  Screening Tests Health Maintenance  Topic Date Due   Lung Cancer Screening  02/15/2021   Zoster Vaccines- Shingrix (2 of 2) 06/24/2022   INFLUENZA VACCINE  02/27/2023   COVID-19 Vaccine (7 - 2023-24 season) 03/30/2023   Medicare Annual Wellness (AWV)  05/03/2023   Diabetic kidney evaluation - Urine ACR  04/30/2027 (Originally 03/03/2021)   Diabetic kidney evaluation - eGFR measurement  12/11/2023   DTaP/Tdap/Td (3 - Td or Tdap) 03/03/2030   Colonoscopy  12/23/2030   Pneumonia Vaccine 59+ Years old  Completed   Hepatitis C Screening  Completed   HPV VACCINES  Aged Out   FOOT EXAM  Discontinued   HEMOGLOBIN A1C  Discontinued   OPHTHALMOLOGY EXAM  Discontinued    Health Maintenance  Health Maintenance Due  Topic Date Due   Lung Cancer Screening  02/15/2021   Zoster Vaccines- Shingrix (2 of 2) 06/24/2022   INFLUENZA VACCINE  02/27/2023   COVID-19 Vaccine (7 - 2023-24 season) 03/30/2023   Medicare Annual Wellness (AWV)  05/03/2023    Colorectal Cancer Status Colonoscopy 11/2020 Next Due in 1 year. Patient given information regarding this and he is to contact GI as soon as we finish this call Letter was sent to patient from GI which was read to him.  Lung  Cancer Screening: (Low Dose CT Chest recommended if Age 66-80 years, 20 pack-year currently smoking OR have quit w/in 15years.) does qualify. Order placed 07/2022 Patient will reach out for an appointment    Additional Screening:  Hepatitis C Screening: does qualify; Completed 06/1997  Vision Screening: Recommended annual ophthalmology exams for early detection of glaucoma and other disorders of the eye. Is the patient up to date with their annual eye exam?  Yes  Who is the provider or what is the name of the office in which the patient attends annual eye exams? [facility] If pt is not established with a provider, would they like to be referred to a provider to establish care? No .   Dental Screening: Recommended annual dental exams for proper oral hygiene    Community Resource Referral / Chronic Care Management: CRR required this visit?  No   CCM required this visit?  No     Plan:     I have personally reviewed and noted the following in the patient's chart:   Medical and social history Use of alcohol, tobacco or illicit drugs  Current medications and supplements including opioid prescriptions. Patient is not currently taking opioid prescriptions. Functional ability and status Nutritional status Physical activity Advanced directives List of other physicians Hospitalizations, surgeries, and ER visits in previous 12 months Vitals Screenings to include cognitive, depression, and falls Referrals and appointments  In addition, I have reviewed and discussed with patient certain preventive protocols, quality metrics, and best practice recommendations. A written personalized care plan for preventive services as well as general preventive health recommendations were provided to patient.     Sydell Axon, LPN   [id number]   After Visit Summary: (MyChart) Due to this being a telephonic visit, the after visit summary with patients personalized plan was offered to  patient via MyChart   Nurse Notes: None

## 2023-05-07 NOTE — Telephone Encounter (Signed)
RE-ENROLLMENT PAP: PAP application for [employer] and [employer], ([employer] ([employer]) and [employer]) has been mailed to [employer] home address on file. Will fax provider portion of application to provider's office when pt's portion is received.  PLEASE BE ADVISED

## 2023-05-07 NOTE — Progress Notes (Signed)
Subjective:    Patient ID: Ian Mills, male    DOB: Dec 20, 1955, 67 y.o.   MRN: [id number] Chief Complaint  Patient presents with   Carotid    Pt conv u/s follow up    Ian Mills is a 67 y.o. male.  He returns today for follow-up of his carotid artery stenosis.  Patient does not have a recent history of stroke or TIA symptoms. Specifically, the patient denies amaurosis fugax, speech or swallowing difficulties, or arm or leg weakness or numbness.  To a further evaluate his carotid disease, duplex was performed today.  Carotid duplex showed heterogeneous plaque bilaterally with velocities that would fall into the upper end of the 1 to 39% range.  Additionally noted biphasic waveforms in his right subclavian artery but he continues to have antegrade flow in his bilateral vertebral arteries, indicating there is no subclavian steal currently.    Review of Systems  All other systems reviewed and are negative.      Objective:   Physical Exam Vitals reviewed.  HENT:     Head: Normocephalic.  Cardiovascular:     Rate and Rhythm: Normal rate.     Pulses:          Radial pulses are 2+ on the right side and 2+ on the left side.  Pulmonary:     Effort: Pulmonary effort is normal.  Skin:    General: Skin is warm and dry.  Neurological:     Mental Status: He is alert and oriented to person, place, and time.  Psychiatric:        Mood and Affect: Mood normal.        Behavior: Behavior normal.        Thought Content: Thought content normal.        Judgment: Judgment normal.     BP 122/66 (BP Location: Left Arm)   Pulse 71   Resp 16   Wt 199 lb 12.8 oz (90.6 kg)   BMI 30.38 kg/m   Past Medical History:  Diagnosis Date   Acute anterior wall MI (HCC) 07/04/1991   a.) LHC: EF 29%; 100% mLAD -> PTCA performed   Acute cough 08/16/2022   Anxiety    Aortic atherosclerosis (HCC)    Baker's cyst 06/10/2019   5.3 cm 01/2018 noted [place] no repair as of 06/10/2019      Bilateral  impacted cerumen 06/10/2019   Cellulitis of left foot 05/10/2022   CHF (congestive heart failure) (HCC)    COPD (chronic obstructive pulmonary disease) (HCC)    Coronary artery disease    a.) 3 stents --> 3x18 mm Duet to OM3, 2.5x18 mm Duet to D1, 2.5x13 mm Duet to RPDA. b. 06/2013 PCI: CTO LAD, patent LAD stent, RCA 70p (FFR 0.74--> DES), 24m (DES), RPDA 50%, EF 20%; c. 08/2019 Cath: LM nl, LAD 100p/m, D1 60, D2 patent stent, LCX 60ost/p, 29m ISR, RCA 30p, 43m ISR.   Dizziness 03/30/2015   Family history of lung cancer    HFrEF (heart failure with reduced ejection fraction) (HCC)    a. 05/2014 Echo: EF 25-30%; b. 01/2018 Echo ([facility]): EF 30%; c. 06/2021 Echo: EF 25-30%, apical AK, GrI DD, nl RV fxn.   History of prostate surgery 06/09/2020   History of ventricular tachycardia 01/23/2012   HLD (hyperlipidemia)    HTN (hypertension)    ICD  single,BSX    a. DOI 11/2011; b. S/N# [id number]   Insomnia 06/01/2014   Ischemic cardiomyopathy  a. 05/2014 Echo: EF 25-30%; b. 01/2018 Echo ([facility]): EF 30%, c. 06/2021 Echo: EF 25-30%.   Lateral wall myocardial infarction (HCC) 06/29/1997   a.) LHC: EF 44%; 75% RPDA and 95% OM3; PCI performed and a 3.0 x 18 mm Duet stent placed to OM3   Leukocytosis 08/16/2022   Long term current use of antithrombotics/antiplatelets    a.) DAPT therapy (ASA + clopidogrel)   NSTEMI (non-ST elevated myocardial infarction) (HCC) 11/27/2011   a.) LHC: 100% mLAD; intervention deferred opting for medical management.   NSTEMI (non-ST elevated myocardial infarction) (HCC) 06/23/2016   Personal history of colonic polyps    Pneumonia 08/2018   Sacroiliac pain 07/31/2016   Formatting of this note might be different from the original. Added automatically from request for surgery [id number]   Sepsis (HCC) 07/29/2021   Sepsis due to group A Streptococcus with acute renal failure and septic shock (HCC)    Syncope and collapse    Syncope and collapse 09/26/2010   Qualifier: Diagnosis of    By: Mariah Milling MD, Tim       Tobacco abuse 06/13/2016   URI (upper respiratory infection) 03/17/2022   Ventricular tachycardia (HCC) 12/01/2011   a.) VT arrest; ROSC achieved --> [employer] AICD placed.    Social History   Socioeconomic History   Marital status: Legally Separated    Spouse name: Stanton Kidney    Number of children: 2   Years of education: Not on file   Highest education level: Not on file  Occupational History   Occupation: retired   Tobacco Use   Smoking status: Former    Current packs/day: 0.00    Average packs/day: 0.5 packs/day for 42.0 years (21.0 ttl pk-yrs)    Types: Cigarettes    Start date: 11/09/1977    Quit date: 11/10/2019    Years since quitting: 3.4   Smokeless tobacco: Never  Vaping Use   Vaping status: Never Used  Substance and Sexual Activity   Alcohol use: Yes    Alcohol/week: 6.0 standard drinks of alcohol    Types: 6 Cans of beer per week    Comment: per week   Drug use: No   Sexual activity: Not on file  Other Topics Concern   Not on file  Social History Narrative   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    Lives in [place]    2 kids daughters    Gets regular exercise   Smoker   [employer]    Social Determinants of Health   Financial Resource Strain: Low Risk  (05/02/2022)   Overall Financial Resource Strain (CARDIA)    Difficulty of Paying Living Expenses: Not hard at all  Food Insecurity: No Food Insecurity (05/02/2022)   Hunger Vital Sign    Worried About Running Out of Food in the Last Year: Never true    Ran Out of Food in the Last Year: Never true  Transportation Needs: No Transportation Needs (05/02/2022)   PRAPARE - Administrator, Civil Service (Medical): No    Lack of Transportation (Non-Medical): No  Physical Activity: Insufficiently Active (05/02/2022)   Exercise Vital Sign    Days of Exercise per Week: 4 days    Minutes of Exercise per  Session: 20 min  Stress: No Stress Concern Present (05/02/2022)   [employer] of Occupational Health - Occupational Stress Questionnaire    Feeling of Stress :  Not at all  Social Connections: Unknown (05/02/2022)   Social Connection and Isolation Panel [NHANES]    Frequency of Communication with Friends and Family: Not on file    Frequency of Social Gatherings with Friends and Family: Not on file    Attends Religious Services: Not on file    Active Member of Clubs or Organizations: Not on file    Attends Banker Meetings: Not on file    Marital Status: Married  Intimate Partner Violence: Not At Risk (05/02/2022)   Humiliation, Afraid, Rape, and Kick questionnaire    Fear of Current or Ex-Partner: No    Emotionally Abused: No    Physically Abused: No    Sexually Abused: No    Past Surgical History:  Procedure Laterality Date   APPENDECTOMY N/A    CARDIAC DEFIBRILLATOR PLACEMENT N/A 12/01/2011   COLONOSCOPY WITH PROPOFOL N/A 12/22/2020   Procedure: COLONOSCOPY WITH PROPOFOL;  Surgeon: Wyline Mood, MD;  Location: [facility] ENDOSCOPY;  Service: Gastroenterology;  Laterality: N/A;   CORONARY ANGIOPLASTY Left 07/05/1991   Procedure: CORONARY ANGIOPLASTY; Location: [facility]; Surgeon: Eugenia Pancoast, MD   CORONARY ANGIOPLASTY Left 08/06/1991   Procedure: CORONARY ANGIOPLASTY; Location: [facility]; Surgeon: Ned Clines, MD   CORONARY ANGIOPLASTY Left 08/09/1991   Procedure: CORONARY ANGIOPLASTY; Location: [facility]; Surgeon: Ned Clines, MD   CORONARY ANGIOPLASTY Left 10/28/1991   Procedure: CORONARY ANGIOPLASTY; Location: [facility]; Surgeon: Ned Clines, MD   CORONARY ANGIOPLASTY Left 11/02/1991   Procedure: CORONARY ANGIOPLASTY; Location: [facility]; Surgeon: Ned Clines, MD   CORONARY ANGIOPLASTY Left 01/10/1992   Procedure: CORONARY ANGIOPLASTY; Location: [facility]; Surgeon: Philomena Course, MD   CORONARY ANGIOPLASTY Left 09/26/1993   Procedure: CORONARY ANGIOPLASTY; Location: [facility]; Surgeon: Theone Murdoch, MD   CORONARY ANGIOPLASTY Left 09/11/2006   Procedure: CORONARY ANGIOPLASTY; Location: [facility]   CORONARY ANGIOPLASTY Left 01/17/2010   Procedure: CORONARY ANGIOPLASTY; Location: [facility]; Surgeon: Freddi Che, MD   CORONARY ANGIOPLASTY Left 11/27/2011   Procedure: CORONARY ANGIOPLASTY; Location: [facility]; Surgeon: Nolon Rod, MD   CORONARY ANGIOPLASTY WITH STENT PLACEMENT Left 06/29/1997   Procedure: CORONARY ANGIOPLASTY WITH STENT PLACEMENT (3.0 x 18 mm Diet stent to OM3); Location: [facility]; Surgeon: Samuella Cota, MD   CORONARY ANGIOPLASTY WITH STENT PLACEMENT Left 07/12/1998   Procedure: CORONARY ANGIOPLASTY WITH STENT PLACEMENT (2.8 x 8 mm Duet stent to D1, 2.5 x 13 mm Duet stent to RPDA); Location: [facility]   GRAFT APPLICATION Left 08/24/2021   Procedure: GRAFT APPLICATION;  Surgeon: Sung Amabile, DO;  Location: [facility] ORS;  Service: General;  Laterality: Left;   LEFT HEART CATH AND CORONARY ANGIOGRAPHY Left 08/30/2019   Procedure: LEFT HEART CATH AND CORONARY ANGIOGRAPHY;  Surgeon: Iran Ouch, MD;  Location: [facility] INVASIVE CV LAB;  Service: Cardiovascular;  Laterality: Left;   PROSTATE SURGERY     urethra grew around prostate    TUMOR REMOVAL  2001   chest thymic cyst; behind lungs [facility] benign     Family History  Problem Relation Age of Onset   Cancer - Lung Mother    Hypertension Mother    Heart attack Mother    Cancer Father        larynx   Dementia Maternal Grandmother    Heart attack Paternal Grandfather    Colon cancer Neg Hx    Stomach cancer Neg Hx    Pancreatic cancer Neg Hx     Allergies  Allergen Reactions   Nitroglycerin Nausea Only and Other (See Comments)    Per patient "causes severe  headache"  Latest Ref Rng & Units 09/18/2022   12:00 AM 08/15/2022    1:56 PM 08/07/2022   10:52 AM  CBC  WBC  7.9     10.5  13.2   Hemoglobin 13.5 - 17.5 15.4     15.5  15.9   Hematocrit 41 - 53 46     47.6  50.6   Platelets 150 - 400 K/uL 176     220.0  196       This result is from an external source.      CMP     Component Value Date/Time   NA 138 12/11/2022 1128   NA 133 (A) 09/18/2022 0000   NA 137 05/30/2014 0455   K 4.5 12/11/2022 1128   K 3.9 05/30/2014 0455   CL 108 12/11/2022 1128   CL 106 05/30/2014 0455   CO2 22 12/11/2022 1128   CO2 26 05/30/2014 0455   GLUCOSE 108 (H) 12/11/2022 1128   GLUCOSE 104 (H) 05/30/2014 0455   BUN 29 (H) 12/11/2022 1128   BUN 27 (A) 09/18/2022 0000   BUN 12 05/30/2014 0455   CREATININE 1.19 12/11/2022 1128   CREATININE 1.33 (H) 06/09/2020 1447   CREATININE 1.33 (H) 06/09/2020 1447   CALCIUM 9.3 12/11/2022 1128   CALCIUM 8.1 (L) 05/30/2014 0455   PROT 7.9 06/25/2022 1340   PROT 6.1 04/06/2021 1407   PROT 7.2 05/29/2014 1136   ALBUMIN 4.1 06/25/2022 1340   ALBUMIN 3.8 04/06/2021 1407   ALBUMIN 3.6 05/29/2014 1136   AST 18 06/25/2022 1340   AST 21 05/29/2014 1136   ALT 24 06/25/2022 1340   ALT 24 05/29/2014 1136   ALKPHOS 93 06/25/2022 1340   ALKPHOS 116 05/29/2014 1136   BILITOT 1.0 06/25/2022 1340   BILITOT 0.4 04/06/2021 1407   BILITOT 0.5 05/29/2014 1136   GFR 52.53 (L) 02/27/2022 1418   EGFR 59 (L) 10/03/2021 1139   GFRNONAA >60 12/11/2022 1128   GFRNONAA 55 (L) 06/09/2020 1447     No results found.     Assessment & Plan:   1. Bilateral carotid artery stenosis Today the patient has minimal stenosis in bilateral internal carotid arteries.  There is some evidence of early stenosis of his right subclavian artery but no evidence of steal as of yet as he currently has antegrade flow in his vertebral arteries and there is no major difference between blood pressures in his upper extremities.  I do not believe that the pain in his neck and shoulders were related to this.  We will continue to follow-up and have him return in 6 months with noninvasive studies.  2. Essential hypertension Continue antihypertensive medications as already ordered, these medications have been reviewed and  there are no changes at this time.  3. Mixed hyperlipidemia Continue statin as ordered and reviewed, no changes at this time   Current Outpatient Medications on File Prior to Visit  Medication Sig Dispense Refill   albuterol (VENTOLIN HFA) 108 (90 Base) MCG/ACT inhaler Inhale 1-2 puffs into the lungs every 6 (six) hours as needed for wheezing or shortness of breath. 18 g 11   ALPRAZolam (XANAX) 0.5 MG tablet TAKE 1 TABLET BY MOUTH THREE TIMES DAILY AS NEEDED FOR ANXIETY 90 tablet 0   aspirin EC 81 MG tablet Take 81 mg by mouth daily. Swallow whole.     atorvastatin (LIPITOR) 80 MG tablet Take 1 tablet (80 mg total) by mouth daily. 90 tablet 3   carvedilol (COREG) 3.125 MG  tablet Take 1 tablet (3.125 mg total) by mouth 2 (two) times daily. 60 tablet 0   clopidogrel (PLAVIX) 75 MG tablet Take 1 tablet (75 mg total) by mouth daily. 90 tablet 3   dapagliflozin propanediol (FARXIGA) 10 MG TABS tablet Take 1 tablet (10 mg total) by mouth daily before breakfast. 90 tablet 3   ezetimibe (ZETIA) 10 MG tablet Take 1 tablet (10 mg total) by mouth daily. 90 tablet 3   ipratropium-albuterol (DUONEB) 0.5-2.5 (3) MG/3ML SOLN Take 3 mLs by nebulization every 6 (six) hours as needed. 360 mL 11   potassium chloride (KLOR-CON 10) 10 MEQ tablet Take 1 tablet (10 mEq total) by mouth daily. 90 tablet 3   sacubitril-valsartan (ENTRESTO) 49-51 MG Take 1 tablet by mouth 2 (two) times daily. 180 tablet 3   spironolactone (ALDACTONE) 25 MG tablet Take 1 tablet (25 mg total) by mouth daily. 90 tablet 3   tadalafil (CIALIS) 20 MG tablet Take 0.5-1 tablets (10-20 mg total) by mouth daily as needed for erectile dysfunction (take 1 hour prior to sexual activity). 30 tablet 11   Tiotropium Bromide-Olodaterol (STIOLTO RESPIMAT) 2.5-2.5 MCG/ACT AERS Inhale 2 puffs into the lungs daily. 4 g 11   torsemide (DEMADEX) 20 MG tablet Take 1 tablet (20 mg total) by mouth as needed. 180 tablet 3   No current facility-administered  medications on file prior to visit.    There are no Patient Instructions on file for this visit. No follow-ups on file.   Georgiana Spinner, NP

## 2023-05-07 NOTE — Patient Instructions (Addendum)
Ian Mills , Thank you for taking time to come for your Medicare Wellness Visit. I appreciate your ongoing commitment to your health goals. Please review the following plan we discussed and let me know if I can assist you in the future.   Referrals/Orders/Follow-Ups/Clinician Recommendations: Remember to reach out to your GI doctor . Update your vaccines.  This is a list of the screening recommended for you and due dates:  Health Maintenance  Topic Date Due   Screening for Lung Cancer  02/15/2021   Zoster (Shingles) Vaccine (2 of 2) 06/24/2022   Flu Shot  02/27/2023   COVID-19 Vaccine (7 - 2023-24 season) 03/30/2023   Yearly kidney health urinalysis for diabetes  04/30/2027*   Yearly kidney function blood test for diabetes  12/11/2023   Medicare Annual Wellness Visit  05/06/2024   DTaP/Tdap/Td vaccine (3 - Td or Tdap) 03/03/2030   Colon Cancer Screening  12/23/2030   Pneumonia Vaccine  Completed   Hepatitis C Screening  Completed   HPV Vaccine  Aged Out   Complete foot exam   Discontinued   Hemoglobin A1C  Discontinued   Eye exam for diabetics  Discontinued  *Topic was postponed. The date shown is not the original due date.    Advanced directives: (In Chart) A copy of your advanced directives are scanned into your chart should your provider ever need it.  Next Medicare Annual Wellness Visit scheduled for next year: Yes 05/11/24 @ 11:15

## 2023-05-14 ENCOUNTER — Telehealth: Payer: Self-pay | Admitting: Cardiovascular Disease

## 2023-05-14 MED ORDER — CLOPIDOGREL BISULFATE 75 MG PO TABS: 75.0000 mg | ORAL_TABLET | Freq: Every day | ORAL | 0 refills | Status: DC

## 2023-05-14 NOTE — Telephone Encounter (Signed)
..  Requested Prescriptions   Pending Prescriptions Disp Refills  . clopidogrel (PLAVIX) 75 MG tablet 90 tablet 0    Sig: Take 1 tablet (75 mg total) by mouth daily.

## 2023-05-14 NOTE — Telephone Encounter (Signed)
 *  STAT* If patient is at the pharmacy, call can be transferred to refill team.   1. Which medications need to be refilled? (please list name of each medication and dose if known) clopidogrel (PLAVIX) 75 MG tablet    2. Would you like to learn more about the convenience, safety, & potential cost savings by using the [facility]?    3. Are you open to using the [facility] (Type [facility].    4. Which pharmacy/location (including street and city if local pharmacy) is medication to be sent to?  [facility] - [place], [place] - [address]     5. Do they need a 30 day or 90 day supply? 90 days   Pt been out of meds for 4 days

## 2023-05-16 ENCOUNTER — Encounter: Payer: Self-pay | Admitting: Family Medicine

## 2023-05-16 ENCOUNTER — Ambulatory Visit (INDEPENDENT_AMBULATORY_CARE_PROVIDER_SITE_OTHER): Payer: Medicare Other | Admitting: Family Medicine

## 2023-05-16 VITALS — BP 112/68 | HR 63 | Temp 97.5°F | Ht 68.0 in | Wt 201.0 lb

## 2023-05-16 DIAGNOSIS — Z23 Encounter for immunization: Secondary | ICD-10-CM

## 2023-05-16 DIAGNOSIS — M25812 Other specified joint disorders, left shoulder: Secondary | ICD-10-CM

## 2023-05-16 DIAGNOSIS — F39 Unspecified mood [affective] disorder: Secondary | ICD-10-CM

## 2023-05-16 DIAGNOSIS — M51369 Other intervertebral disc degeneration, lumbar region without mention of lumbar back pain or lower extremity pain: Secondary | ICD-10-CM | POA: Diagnosis not present

## 2023-05-16 MED ORDER — ALPRAZOLAM 0.5 MG PO TABS
0.5000 mg | ORAL_TABLET | Freq: Three times a day (TID) | ORAL | 5 refills | Status: DC | PRN
Start: 2023-05-16 — End: 2023-11-17

## 2023-05-16 NOTE — Progress Notes (Unsigned)
SUBJECTIVE:  No chief complaint on file.  HPI Presents to clinic for follow up   PERTINENT PMH / PSH: As above  OBJECTIVE:  BP 112/68   Pulse 63   Temp (!) 97.5 F (36.4 C) (Oral)   Ht 5\' 8"  (1.727 m)   Wt 201 lb (91.2 kg)   SpO2 99%   BMI 30.56 kg/m    Physical Exam Vitals reviewed.  Constitutional:      General: He is not in acute distress.    Appearance: Normal appearance. He is obese. He is not ill-appearing, toxic-appearing or diaphoretic.  Eyes:     General:        Right eye: No discharge.        Left eye: No discharge.  Cardiovascular:     Rate and Rhythm: Normal rate and regular rhythm.     Heart sounds: Normal heart sounds.  Pulmonary:     Effort: Pulmonary effort is normal.     Breath sounds: Normal breath sounds.  Abdominal:     General: Bowel sounds are normal.  Musculoskeletal:        General: Normal range of motion.     Cervical back: Normal range of motion.  Skin:    General: Skin is warm and dry.  Neurological:     Mental Status: He is alert and oriented to person, place, and time. Mental status is at baseline.  Psychiatric:        Mood and Affect: Mood normal.        Behavior: Behavior normal.        Thought Content: Thought content normal.        Judgment: Judgment normal.        05/07/2023   10:46 AM 12/25/2022    2:32 PM 09/23/2022   10:53 AM 08/26/2022    2:52 PM 08/15/2022    1:31 PM  Depression screen PHQ 2/9  Decreased Interest 0 0 0 0 0  Down, Depressed, Hopeless 0 0 0 0 0  PHQ - 2 Score 0 0 0 0 0  Altered sleeping 1 0 0 0 0  Tired, decreased energy 0 0 0 0 0  Change in appetite 0 0 0 0 0  Feeling bad or failure about yourself  0 0 0 0 0  Trouble concentrating 0 0 0 0 0  Moving slowly or fidgety/restless 0 0 0 0 0  Suicidal thoughts 0 0 0 0 0  PHQ-9 Score 1 0 0 0 0  Difficult doing work/chores Not difficult at all Not difficult at all Not difficult at all Not difficult at all Not difficult at all      12/25/2022    2:33  PM 08/26/2022    2:52 PM 08/15/2022    1:31 PM 11/02/2021   11:10 AM  GAD 7 : Generalized Anxiety Score  Nervous, Anxious, on Edge 0 0 0 0  Control/stop worrying 0 0 0 0  Worry too much - different things 0 0 0 0  Trouble relaxing 0 0 0 0  Restless 0 0 0 0  Easily annoyed or irritable 0 0 0 0  Afraid - awful might happen 0 0 0 0  Total GAD 7 Score 0 0 0 0  Anxiety Difficulty Not difficult at all Not difficult at all Not difficult at all Not difficult at all    ASSESSMENT/PLAN:  Need for influenza vaccination -     Flu Vaccine Trivalent High Dose (Fluad)  Mood disorder (HCC) -  ALPRAZolam; Take 1 tablet (0.5 mg total) by mouth 3 (three) times daily as needed. for anxiety  Dispense: 90 tablet; Refill: 5   PDMP reviewed  Return in about 6 months (around 11/14/2023).  Dana Allan, MD

## 2023-05-16 NOTE — Patient Instructions (Addendum)
It was a pleasure meeting you today. Thank you for allowing me to take part in your health care.  Our goals for today as we discussed include:  Refill sent for Xanax If any issues with refills please notify MD  Received Flu vaccine today  You received the Pneumonia 209 vaccine 04/2022.  No further vaccines required  Follow up with Cardiology, Pain management for injection, Pulmonology and Urology as scheduled  Follow up PCP 6 months  This is a list of the screening recommended for you and due dates:  Health Maintenance  Topic Date Due   Screening for Lung Cancer  02/15/2021   Zoster (Shingles) Vaccine (2 of 2) 06/24/2022   COVID-19 Vaccine (7 - 2023-24 season) 03/30/2023   Yearly kidney health urinalysis for diabetes  04/30/2027*   Yearly kidney function blood test for diabetes  12/11/2023   Medicare Annual Wellness Visit  05/06/2024   DTaP/Tdap/Td vaccine (3 - Td or Tdap) 03/03/2030   Colon Cancer Screening  12/23/2030   Pneumonia Vaccine  Completed   Flu Shot  Completed   Hepatitis C Screening  Completed   HPV Vaccine  Aged Out   Complete foot exam   Discontinued   Hemoglobin A1C  Discontinued   Eye exam for diabetics  Discontinued  *Topic was postponed. The date shown is not the original due date.      If you have any questions or concerns, please do not hesitate to call the office at [phone].  I look forward to our next visit and until then take care and stay safe.  Regards,   Dana Allan, MD   [facility]

## 2023-05-20 DIAGNOSIS — Z23 Encounter for immunization: Secondary | ICD-10-CM | POA: Insufficient documentation

## 2023-05-20 NOTE — Assessment & Plan Note (Signed)
Stable on Xanax 3 times daily. No reported issues with medication. -Refill Xanax prescription with 90 tablets and 3 refills.

## 2023-05-20 NOTE — Assessment & Plan Note (Signed)
Recent injection for shoulder arthritis, reported improvement. -Continue current management plan.

## 2023-05-20 NOTE — Assessment & Plan Note (Signed)
Scheduled for spinal injection in [place]. -Continue current management plan.

## 2023-05-21 ENCOUNTER — Telehealth: Payer: Self-pay | Admitting: Cardiovascular Disease

## 2023-05-21 DIAGNOSIS — Z95810 Presence of automatic (implantable) cardiac defibrillator: Secondary | ICD-10-CM | POA: Diagnosis not present

## 2023-05-21 DIAGNOSIS — M47816 Spondylosis without myelopathy or radiculopathy, lumbar region: Secondary | ICD-10-CM | POA: Diagnosis not present

## 2023-05-21 DIAGNOSIS — M899 Disorder of bone, unspecified: Secondary | ICD-10-CM | POA: Diagnosis not present

## 2023-05-21 DIAGNOSIS — M48062 Spinal stenosis, lumbar region with neurogenic claudication: Secondary | ICD-10-CM | POA: Diagnosis not present

## 2023-05-21 DIAGNOSIS — Z87891 Personal history of nicotine dependence: Secondary | ICD-10-CM | POA: Diagnosis not present

## 2023-05-21 DIAGNOSIS — Z79899 Other long term (current) drug therapy: Secondary | ICD-10-CM | POA: Diagnosis not present

## 2023-05-21 NOTE — Telephone Encounter (Signed)
Pre-operative Risk Assessment    Patient Name: Ian Mills  DOB: Aug 20, 1955 MRN: [id number]      Request for Surgical Clearance    Procedure:   ESI  Date of Surgery:  Clearance TBD                                 Surgeon:  not indicated Surgeon's Group or Practice Name:  [facility] Phone number:  [phone] Fax number:  [phone]   Type of Clearance Requested:   - Medical  - Pharmacy:  Hold Clopidogrel (Plavix) stop taking for 5 to 7 days   Type of Anesthesia:  Not Indicated   Additional requests/questions:    Burnett Sheng   05/21/2023, 11:12 AM

## 2023-05-21 NOTE — Telephone Encounter (Signed)
D/w the pre op APP Edd Fabian, FNP ok to defer clearance to appt with Charlsie Quest, NP 06/03/23. Our first tele visit for pre op is not until 06/04/23, so pt will be seen sooner in office, than tele appt.   I will update all parties involved.

## 2023-05-21 NOTE — Telephone Encounter (Signed)
Name: Ian Mills  DOB: 03/16/1956  MRN: [id number]  Primary Cardiologist: Julien Nordmann, MD   Preoperative team, please contact this patient and set up a phone call appointment for further preoperative risk assessment. Please obtain consent and complete medication review. Thank you for your help.  I confirm that guidance regarding antiplatelet and oral anticoagulation therapy has been completed and, if necessary, noted below.  His Plavix may be held for 5-7 days prior to his procedure.  Please resume as soon as hemostasis is achieved.  I also confirmed the patient resides in the state of [place]. As per [facility] Medical Board telemedicine laws, the patient must reside in the state in which the provider is licensed.   Ronney Asters, NP 05/21/2023, 12:58 PM  [facility]

## 2023-05-28 NOTE — Telephone Encounter (Signed)
FAXED TO PROVIDER OFFICE OF Julien Nordmann  for ENTRESTO 49/51(NOVARTIS)  FAXED TO PROVIDER OFFICE OF [facility] FOR STIOLTO(BICARES)

## 2023-06-02 ENCOUNTER — Telehealth: Payer: Self-pay | Admitting: Family Medicine

## 2023-06-02 NOTE — Telephone Encounter (Signed)
[facility] called about a fax that was sent over on 05-21-23. The fax number is [phone]. They just need the provider to sign and fax back to them.

## 2023-06-02 NOTE — Telephone Encounter (Signed)
PAP: Application for Sherryll Burger has been submitted to PAP Companies: [employer], via fax   PLEASE BE ADVISED   Melanee Spry CPhT [facility] [phone]

## 2023-06-03 ENCOUNTER — Telehealth: Payer: Self-pay

## 2023-06-03 ENCOUNTER — Ambulatory Visit: Payer: Medicare Other | Admitting: Cardiology

## 2023-06-03 NOTE — Telephone Encounter (Signed)
Received forms from [employer] and call pt to see if he authorized it. He stated that a crazy man called him and he advised them he was not interested. Patient ask me to shred the paperwork and any other further one we get from the company.

## 2023-06-03 NOTE — Telephone Encounter (Signed)
Called pt and he said that he did not want to do it and to shred the paperwork. See previous phone note.

## 2023-06-04 ENCOUNTER — Telehealth: Payer: Self-pay | Admitting: Cardiovascular Disease

## 2023-06-04 NOTE — Telephone Encounter (Signed)
   Patient Name: [name]  DOB: 1955/09/07 MRN: [id number]  Primary Cardiologist: Julien Nordmann, MD  Chart reviewed as part of pre-operative protocol coverage. Given past medical history and time since last visit, based on ACC/AHA guidelines, JEVEN TOPPER is at acceptable risk for the planned procedure without further cardiovascular testing.   The patient was advised that if he develops new symptoms prior to surgery to contact our office to arrange for a follow-up visit, and he verbalized understanding.  Patient can hold Plavix 5 days prior to procedure and should restart postprocedure when surgically safe and hemostasis is achieved.  I will route this recommendation to the requesting party via Epic fax function and remove from pre-op pool.  Please call with questions.  Napoleon Form, Leodis Rains, NP 06/04/2023, 4:42 PM

## 2023-06-04 NOTE — Telephone Encounter (Signed)
Patient states he has never had to have an appt to be cleared before and wants to know if he can just stop the medication. He says Dr. Mariah Milling just told him he could stop the medication last time. He would like a call back today, because he says he would need to stop his medication for a week. He says he has to have this injection in his back and he cannot put it off any longer.

## 2023-06-04 NOTE — Telephone Encounter (Signed)
   Pre-operative Risk Assessment    Patient Name: Ian Mills  DOB: 1956/06/28 MRN: [id number]      Request for Surgical Clearance    Procedure:  lumbar epideral steroid injection  Date of Surgery:  Clearance TBD                                 Surgeon:  Dr. Towanda Octave Surgeon's Group or Practice Name:  [facility] Phone number:  [phone] Fax number:  [phone]   Type of Clearance Requested:  Pharmacy hold Plavix 3-7 days     Type of Anesthesia:  Not Indicated   Additional requests/questions:    Queen Slough   06/04/2023, 4:13 PM

## 2023-06-05 NOTE — Telephone Encounter (Addendum)
Error

## 2023-06-05 NOTE — Telephone Encounter (Signed)
Patient has already been cleared for surgery. See phone note on 06/04/23

## 2023-06-09 ENCOUNTER — Telehealth: Payer: Self-pay | Admitting: Cardiovascular Disease

## 2023-06-09 ENCOUNTER — Ambulatory Visit: Payer: Medicare Other

## 2023-06-09 DIAGNOSIS — I472 Ventricular tachycardia, unspecified: Secondary | ICD-10-CM | POA: Diagnosis not present

## 2023-06-09 DIAGNOSIS — I10 Essential (primary) hypertension: Secondary | ICD-10-CM

## 2023-06-09 DIAGNOSIS — I25118 Atherosclerotic heart disease of native coronary artery with other forms of angina pectoris: Secondary | ICD-10-CM

## 2023-06-09 MED ORDER — CARVEDILOL 3.125 MG PO TABS
3.1250 mg | ORAL_TABLET | Freq: Two times a day (BID) | ORAL | 0 refills | Status: DC
Start: 2023-06-09 — End: 2023-06-30

## 2023-06-09 NOTE — Telephone Encounter (Signed)
Requested Prescriptions   Signed Prescriptions Disp Refills   carvedilol (COREG) 3.125 MG tablet 180 tablet 0    Sig: Take 1 tablet (3.125 mg total) by mouth 2 (two) times daily.    Authorizing Provider: Antonieta Iba    Ordering User: Guerry Minors

## 2023-06-09 NOTE — Telephone Encounter (Signed)
last visit 10/22/22 with plan to f/u in  6 months.    next visit:  06/30/23

## 2023-06-09 NOTE — Telephone Encounter (Signed)
*  STAT* If patient is at the pharmacy, call can be transferred to refill team.   1. Which medications need to be refilled? (please list name of each medication and dose if known)   carvedilol (COREG) 3.125 MG tablet   2. Would you like to learn more about the convenience, safety, & potential cost savings by using the [facility]?   3. Are you open to using the [facility] (Type [facility]. ).  4. Which pharmacy/location (including street and city if local pharmacy) is medication to be sent to?  [facility] - [place], [place] - [address]   5. Do they need a 30 day or 90 day supply?   90 day  Patient stated he is completely out of this medication.

## 2023-06-10 LAB — CUP PACEART REMOTE DEVICE CHECK
Battery Remaining Longevity: 18 mo
Battery Remaining Percentage: 18 %
Brady Statistic RV Percent Paced: 0 %
Date Time Interrogation Session: 20241112022100
HighPow Impedance: 93 Ohm
Implantable Lead Connection Status: 753985
Implantable Lead Implant Date: 20130503
Implantable Lead Location: 753860
Implantable Lead Model: 292
Implantable Lead Serial Number: 112568
Implantable Pulse Generator Implant Date: 20130503
Lead Channel Impedance Value: 648 Ohm
Lead Channel Pacing Threshold Amplitude: 0.5 V
Lead Channel Pacing Threshold Pulse Width: 0.5 ms
Lead Channel Setting Pacing Amplitude: 2.4 V
Lead Channel Setting Pacing Pulse Width: 0.5 ms
Lead Channel Setting Sensing Sensitivity: 0.6 mV
Pulse Gen Serial Number: 105232
Zone Setting Status: 755011

## 2023-06-12 DIAGNOSIS — M5416 Radiculopathy, lumbar region: Secondary | ICD-10-CM | POA: Diagnosis not present

## 2023-06-12 DIAGNOSIS — M48062 Spinal stenosis, lumbar region with neurogenic claudication: Secondary | ICD-10-CM | POA: Diagnosis not present

## 2023-06-18 DIAGNOSIS — M899 Disorder of bone, unspecified: Secondary | ICD-10-CM | POA: Diagnosis not present

## 2023-06-20 DIAGNOSIS — H25813 Combined forms of age-related cataract, bilateral: Secondary | ICD-10-CM | POA: Diagnosis not present

## 2023-06-23 ENCOUNTER — Telehealth: Payer: Self-pay | Admitting: Family Medicine

## 2023-06-23 NOTE — Telephone Encounter (Signed)
Per last telephone encounter pt didn't want anything to do with this company. Informed Shanda Bumps of this information

## 2023-06-23 NOTE — Telephone Encounter (Signed)
[employer] [employer] just called. Shanda Bumps wanted to know did we receive the order for neuro testing. She said they faxed it twice one on November 20th and November 22nd. Their number is [phone]. Her name is Shanda Bumps if you have any questions. She just needs prior authorization from provider.

## 2023-06-25 NOTE — Progress Notes (Signed)
Pharmacy Medication Assistance Program Note    06/25/2023  Patient ID: RONON GESLER, male   DOB: [birth year], 67 y.o.   MRN: [id number]     05/28/2023 06/02/2023  Outreach Medication One  Initial Outreach Date (Medication One) 05/01/2023   Manufacturer Medication One [employer]   [employer] Entresto   Dose of Entresto 49-51 mg   Type of Radiographer, therapeutic Assistance   Date Application Sent to Patient 05/07/2023   Application Items Requested Application;Proof of Income   Date Application Sent to Prescriber 05/28/2023   Name of Prescriber Julien Nordmann MD   Date Application Received From Patient 05/28/2023   Application Items Received From Patient Application;Proof of Income   Date Application Received From Provider  06/02/2023       Signature    06/25/2023  Patient ID: Margot Ables, male  DOB: [birth year], 67 y.o.  MRN:  [id number]     05/28/2023  Outreach Medication Two  Initial Outreach Date (Medication Two) 05/01/2023  Manufacturer Medication Two [employer]  [employer] Drugs Stiolto  Dose of Stiolto 2.5 MCG/2.5 MG  Type of Radiographer, therapeutic Assistance  Date Application Sent to Patient 05/07/2023  Application Items Requested Application;Proof of Income  Date Application Sent to Prescriber 05/28/2023  Name of Prescriber Judie Grieve, TRACY  Date Application Received From Patient 05/28/2023  Application Items Received From Patient Application;Proof of Income       Signature

## 2023-06-25 NOTE — Telephone Encounter (Signed)
RECEIVED PT AND PROVIDER PAGES AND PAP: Application for STIOLTO has been submitted to PAP Companies: [employer], via fax    PLEASE BE ADVISED

## 2023-06-30 ENCOUNTER — Ambulatory Visit: Payer: Medicare Other | Attending: Cardiology | Admitting: Cardiology

## 2023-06-30 ENCOUNTER — Encounter: Payer: Self-pay | Admitting: Cardiology

## 2023-06-30 VITALS — BP 106/76 | HR 63 | Ht 69.0 in | Wt 198.8 lb

## 2023-06-30 DIAGNOSIS — R001 Bradycardia, unspecified: Secondary | ICD-10-CM | POA: Diagnosis not present

## 2023-06-30 DIAGNOSIS — I472 Ventricular tachycardia, unspecified: Secondary | ICD-10-CM | POA: Diagnosis not present

## 2023-06-30 DIAGNOSIS — E785 Hyperlipidemia, unspecified: Secondary | ICD-10-CM | POA: Diagnosis not present

## 2023-06-30 DIAGNOSIS — I10 Essential (primary) hypertension: Secondary | ICD-10-CM | POA: Diagnosis not present

## 2023-06-30 DIAGNOSIS — I6523 Occlusion and stenosis of bilateral carotid arteries: Secondary | ICD-10-CM

## 2023-06-30 DIAGNOSIS — E782 Mixed hyperlipidemia: Secondary | ICD-10-CM

## 2023-06-30 DIAGNOSIS — E876 Hypokalemia: Secondary | ICD-10-CM | POA: Diagnosis not present

## 2023-06-30 DIAGNOSIS — I25118 Atherosclerotic heart disease of native coronary artery with other forms of angina pectoris: Secondary | ICD-10-CM | POA: Diagnosis not present

## 2023-06-30 DIAGNOSIS — I255 Ischemic cardiomyopathy: Secondary | ICD-10-CM

## 2023-06-30 DIAGNOSIS — I5022 Chronic systolic (congestive) heart failure: Secondary | ICD-10-CM

## 2023-06-30 MED ORDER — SPIRONOLACTONE 25 MG PO TABS: 25.0000 mg | ORAL_TABLET | Freq: Every day | ORAL | 3 refills | Status: DC

## 2023-06-30 MED ORDER — TORSEMIDE 20 MG PO TABS: 20.0000 mg | ORAL_TABLET | ORAL | 3 refills | Status: DC | PRN

## 2023-06-30 MED ORDER — CARVEDILOL 3.125 MG PO TABS: 3.1250 mg | ORAL_TABLET | Freq: Two times a day (BID) | ORAL | 3 refills | Status: DC

## 2023-06-30 MED ORDER — POTASSIUM CHLORIDE ER 10 MEQ PO TBCR: 10.0000 meq | EXTENDED_RELEASE_TABLET | Freq: Every day | ORAL | 3 refills | Status: DC

## 2023-06-30 MED ORDER — ATORVASTATIN CALCIUM 80 MG PO TABS: 80.0000 mg | ORAL_TABLET | Freq: Every day | ORAL | 3 refills | Status: DC

## 2023-06-30 MED ORDER — CLOPIDOGREL BISULFATE 75 MG PO TABS: 75.0000 mg | ORAL_TABLET | Freq: Every day | ORAL | 3 refills | Status: DC

## 2023-06-30 MED ORDER — EZETIMIBE 10 MG PO TABS: 10.0000 mg | ORAL_TABLET | Freq: Every day | ORAL | 3 refills | Status: DC

## 2023-06-30 NOTE — Patient Instructions (Signed)
Medication Instructions:  - No changes *If you need a refill on your cardiac medications before your next appointment, please call your pharmacy*  Lab Work: Your provider would like for you to have following labs drawn today BMP, CBC & Lipid panel.   If you have labs (blood work) drawn today and your tests are completely normal, you will receive your results only by: MyChart Message (if you have MyChart) OR A paper copy in the mail If you have any lab test that is abnormal or we need to change your treatment, we will call you to review the results.  Testing/Procedures: - None ordered  Follow-Up: At [facility], you and your health needs are our priority.  As part of our continuing mission to provide you with exceptional heart care, we have created designated Provider Care Teams.  These Care Teams include your primary Cardiologist (physician) and Advanced Practice Providers (APPs -  Physician Assistants and Nurse Practitioners) who all work together to provide you with the care you need, when you need it.  Your next appointment:   6 month(s)  Provider:   Julien Nordmann, MD

## 2023-06-30 NOTE — Progress Notes (Signed)
Cardiology Office Note:  .   Date:  06/30/2023  ID:  Ian Mills, DOB 09-26-1955, MRN [id number] PCP: Dana Allan, MD  [facility] Providers Cardiologist:  Julien Nordmann, MD Electrophysiologist:  Sherryl Manges, MD    History of Present Illness: .   Ian Mills is a 67 y.o. 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   Patient previously undergone PCI to the LAD, OM 3, PDA in 08/2006.  Subsequent cardiac cath 12/14 revealed severe proximal and mid RCA stenosis requiring PCI.  Echo in 01/2018 showed an EF of 30%.  He underwent a Lexiscan Myoview in 08/10/2019 which showed a large in size, moderate to severe, minimally reversible mid and apical anterior, anteroseptal, and anterior lateral defect that was consistent with scar possible mild peri-infarct ischemia.  There was also moderate in size moderate to severe, mid and apical fixed inferior/inferior lateral defect consistent with scar.  LVEF was less than 30% with mid and apical akinesis.  He was scheduled for left heart catheterization which showed significant underlying three-vessel coronary artery disease with chronically occluded mid LAD, patent first diagonal stent, patent RCA stents with mild in-stent restenosis, patent left circumflex stent with moderate in-stent restenosis.  Progression of ostial left circumflex stenosis to 60%.  Left ventricular angiography was not performed due to chronic kidney disease.  Moderately elevated left ventricular end systolic pressure measuring 25-30 mmHg.  He was recommended he continue aggressive medical therapy with no indication for revascularization.  CT of the chest revealed aortic atherosclerosis, sequela of prior coronary stenting was noted.  There were also some scattered  groundglass and ill-defined nodular basal disease in the bilateral lungs which could have reflected edema or atypical infection.  He was noted pulmonary nodules were previously patent and followed with recommendation for the patient to continue lung cancer screening CTs with his PCP.  He continues to follow with heart failure clinic and was receiving Entresto through [employer] patient assistance.  Continue to follow with the EP for ICD downloads and device management with no arrhythmias noted on device.  He was hospitalized 08/2022 [name] presenting with left shoulder pain he reports that he ruled out cardiac issue.  Repeat echocardiogram revealed an EF of 25%.  He was scheduled to see orthopedics and was noted to have a tear in his rotator cuff on the left side.  He had carotid duplexes completed which revealed bilateral ICAs were consistent with 1-99% stenosis.   He was last seen in clinic 10/22/2022 after recent hospitalization at [facility] for rotator cuff tear.  He was also noted to have carotid artery stenosis and he been following up with Dr. Wyn Quaker from vascular.  He had noted improvement episodes that were likely secondary to orthostasis but had syncope or near syncope.  Blood pressure been stable and medication changes.  He was only taking diuretic therapy on an as-needed basis and was euvolemic on exam.  No further testing was ordered.   He had a follow-up visit with Dr. Graciela Husbands from EP on 12/24/2022.  He was continued on Entresto, spironolactone, carvedilol.  There was a question at that time if he was able to come off of clopidogrel since it was not indicated.  No interval ventricular tachycardia have been treated or noted since 2013  with device implantation amiodarone with discontinued.  He returns to clinic today stating that he has been doing well with a cardiac perspective.  He denies any chest pain, shortness of breath, palpitations, peripheral edema.  Stated he continued to have back  discomfort and recently had an injection.  Denies any recent hospitalizations or visits to the emergency department.  States that he has been compliant with his current medication and needs refills today.  ROS: 10 point review of systems has been reviewed and considered negative with exception of what is been listed in the HPI  Studies Reviewed: Marland Kitchen   EKG Interpretation Date/Time:  Monday June 30 2023 14:01:18 EST Ventricular Rate:  63 PR Interval:  172 QRS Duration:  96 QT Interval:  408 QTC Calculation: 417 R Axis:   -55  Text Interpretation: Normal sinus rhythm Left anterior fascicular block No significant change was found Confirmed by Charlsie Quest ([id number]) on 06/30/2023 2:03:16 PM    2D echo 07/12/2021 1. LV apex not well visualized. apex appears akinetic. could not evaluate  for thrombus presence due to image quality. limited echo with enhancing  agent recommended.. Left ventricular ejection fraction, by estimation, is  25 to 30%. The left ventricle has   severely decreased function. Left ventricular endocardial border not  optimally defined to evaluate regional wall motion. Left ventricular  diastolic parameters are consistent with Grade I diastolic dysfunction  (impaired relaxation). There is akinesis of  the left ventricular, apical segment.   2. Right ventricular systolic function is normal. The right ventricular  size is normal.   3. The mitral valve is normal in structure. No evidence of mitral valve  regurgitation.   4. The aortic valve is tricuspid. Aortic valve regurgitation is not  visualized.   5. The inferior vena cava is normal in size with greater than 50%  respiratory variability, suggesting right atrial pressure of 3 mmHg.   LHC 08/30/2019 Prox RCA to Mid RCA lesion is 20% stenosed. Prox RCA lesion is 30% stenosed. Prox LAD to Mid LAD lesion is 100% stenosed. 1st Diag-1 lesion is 60% stenosed. Previously placed 1st Diag-2 stent (unknown type) is widely  patent. Ost Cx to Prox Cx lesion is 60% stenosed. Mid Cx lesion is 40% stenosed.   1. Significant underlying three-vessel coronary artery disease with chronically occluded mid LAD, patent first diagonal stent, patent RCA stents with mild in-stent restenosis, patent left circumflex stent with moderate in-stent restenosis.  Progression of ostial left circumflex stenosis to 60%. 2.  Left ventricular angiography was not performed due to chronic kidney disease. 3.  Moderately elevated left ventricular end-diastolic pressure between 25 to 30 mmHg.   Recommendations: Continue aggressive medical therapy.  No indication for revascularization. The patient is volume overloaded and will stop cath IV hydration.  Continue furosemide. Recommend checking renal function upon follow-up.  2D echo 02/13/2018 ([facility]) INTERPRETATION ---------------------------------------------------------------    SEVERE LV DYSFUNCTION (See above) WITH MILD LVH    NORMAL RIGHT VENTRICULAR SYSTOLIC FUNCTION    VALVULAR REGURGITATION: TRIVIAL MR, TRIVIAL TR    NO VALVULAR STENOSIS   2D echo 05/2014: EF 25 to 30%, akinesis of the mid-distal anterior, apical, and distal inferior walls, moderate LVH, diastolic dysfunction, mildly to moderately increased LV internal cavity size, moderately dilated left atrium, mildly dilated right atrium, mild mitral regurgitation, mild aortic valve sclerosis without stenosis. Risk Assessment/Calculations:             Physical Exam:   VS:  BP 106/76 (BP Location: Left  Arm, Patient Position: Sitting, Cuff Size: Normal)   Pulse 63   Ht 5\' 9"  (1.753 m)   Wt 198 lb 12.8 oz (90.2 kg)   SpO2 96%   BMI 29.36 kg/m    Wt Readings from Last 3 Encounters:  06/30/23 198 lb 12.8 oz (90.2 kg)  05/16/23 201 lb (91.2 kg)  05/07/23 194 lb (88 kg)    GEN: Well nourished, well developed in no acute distress NECK: No JVD; No carotid bruits CARDIAC: RRR, no murmurs, rubs, gallops RESPIRATORY:  Clear to  auscultation without rales, wheezing or rhonchi  ABDOMEN: Soft, non-tender, non-distended EXTREMITIES:  No edema; No deformity   ASSESSMENT AND PLAN: .   Coronary artery disease of on the native coronary arteries with stable angina.  Patient denies any recurrent chest discomfort or anginal equivalents.  He is continued on aspirin 81 mg daily, atorvastatin 80 mg daily, clopidogrel 75 mg daily, and ezetimibe 10 mg daily.  EKG today revealed sinus rhythm with a left anterior fascicular block with rate of 63 with no acute changes found.  No further ischemic workup needed at this time.  HFrEF secondary to ICM.  He is euvolemic and well compensated.  Recent echocardiogram done at [facility] reveals LVEF of 25%.  He is continued on GDMT of carvedilol, Farxiga, Entresto, Aldactone, and torsemide as needed.  He previously was following with advanced heart failure clinic but states he is no longer following and requires refills to his medications today.  History of ventricular tachycardia status post ICD implantation.  He denies any shocks since implantation.  He is continued on carvedilol.  He is being sent for labs today.  He also continues to follow with EP.  Hypertension with blood pressure today of 106/76.  Blood pressures remain stable.  He is continued on his current therapy without changes today.  Hyperlipidemia where he is continued on atorvastatin 80 mg daily and ezetimibe 10 mg daily.  He has been sent for blood work today for an updated lipid panel. Tobacco use rare complete cessation is recommended.  CKD stage II-3.  Has been stable.  He has been sent for follow-up labs today.  He also states that he continues to follow with nephrology.  Carotid artery disease with recent carotid duplex completed that was unchanged from prior study.  He continues to be followed by [facility].       Dispo: Patient to return to clinic to see MD/APP in 6 months or sooner if needed  Signed, Aragorn Recker, NP

## 2023-07-01 LAB — CBC
Hematocrit: 48.7 % (ref 37.5–51.0)
Hemoglobin: 16 g/dL (ref 13.0–17.7)
MCH: 29.9 pg (ref 26.6–33.0)
MCHC: 32.9 g/dL (ref 31.5–35.7)
MCV: 91 fL (ref 79–97)
Platelets: 185 10*3/uL (ref 150–450)
RBC: 5.35 x10E6/uL (ref 4.14–5.80)
RDW: 13 % (ref 11.6–15.4)
WBC: 8.2 10*3/uL (ref 3.4–10.8)

## 2023-07-01 LAB — BASIC METABOLIC PANEL
BUN/Creatinine Ratio: 21 (ref 10–24)
BUN: 23 mg/dL (ref 8–27)
CO2: 20 mmol/L (ref 20–29)
Calcium: 9.4 mg/dL (ref 8.6–10.2)
Chloride: 103 mmol/L (ref 96–106)
Creatinine, Ser: 1.09 mg/dL (ref 0.76–1.27)
Glucose: 77 mg/dL (ref 70–99)
Potassium: 4.8 mmol/L (ref 3.5–5.2)
Sodium: 140 mmol/L (ref 134–144)
eGFR: 73 mL/min/{1.73_m2} (ref >59)

## 2023-07-01 LAB — LIPID PANEL
Chol/HDL Ratio: 2.9 {ratio} (ref 0.0–5.0)
Cholesterol, Total: 130 mg/dL (ref 100–199)
HDL: 45 mg/dL (ref >39)
LDL Chol Calc (NIH): 68 mg/dL (ref 0–99)
Triglycerides: 87 mg/dL (ref 0–149)
VLDL Cholesterol Cal: 17 mg/dL (ref 5–40)

## 2023-07-02 ENCOUNTER — Telehealth: Payer: Self-pay | Admitting: Cardiovascular Disease

## 2023-07-02 NOTE — Telephone Encounter (Signed)
Spoke to patient and informed him of recent lab results as follows:  "All blood work remains stable. No current medication changes recommended."  Patient understood with read back

## 2023-07-02 NOTE — Progress Notes (Signed)
All blood work remains stable. No current medication changes recommended.

## 2023-07-02 NOTE — Telephone Encounter (Signed)
 Patient is returning call for results. Please advise

## 2023-07-03 NOTE — Progress Notes (Signed)
Remote ICD transmission.   

## 2023-07-04 DIAGNOSIS — H04123 Dry eye syndrome of bilateral lacrimal glands: Secondary | ICD-10-CM | POA: Diagnosis not present

## 2023-07-04 DIAGNOSIS — H2513 Age-related nuclear cataract, bilateral: Secondary | ICD-10-CM | POA: Diagnosis not present

## 2023-07-04 DIAGNOSIS — H538 Other visual disturbances: Secondary | ICD-10-CM | POA: Diagnosis not present

## 2023-07-08 NOTE — Telephone Encounter (Signed)
PAP: Application for Sherryll Burger has been submitted to PAP Companies: [employer], via fax  PLEASE BE ADVISED NEEDED PROOF OF INCOME AND HOLD SIZE FILLED OUT AND RESENT TO COMPANY.

## 2023-07-10 DIAGNOSIS — G8929 Other chronic pain: Secondary | ICD-10-CM | POA: Diagnosis not present

## 2023-07-10 DIAGNOSIS — M542 Cervicalgia: Secondary | ICD-10-CM | POA: Diagnosis not present

## 2023-07-10 DIAGNOSIS — M4312 Spondylolisthesis, cervical region: Secondary | ICD-10-CM | POA: Diagnosis not present

## 2023-07-10 DIAGNOSIS — M778 Other enthesopathies, not elsewhere classified: Secondary | ICD-10-CM | POA: Diagnosis not present

## 2023-07-10 DIAGNOSIS — M4726 Other spondylosis with radiculopathy, lumbar region: Secondary | ICD-10-CM | POA: Diagnosis not present

## 2023-07-10 DIAGNOSIS — M4802 Spinal stenosis, cervical region: Secondary | ICD-10-CM | POA: Diagnosis not present

## 2023-07-10 DIAGNOSIS — M5416 Radiculopathy, lumbar region: Secondary | ICD-10-CM | POA: Diagnosis not present

## 2023-07-15 DIAGNOSIS — H2512 Age-related nuclear cataract, left eye: Secondary | ICD-10-CM | POA: Diagnosis not present

## 2023-07-16 ENCOUNTER — Telehealth: Payer: Self-pay | Admitting: Internal Medicine

## 2023-07-16 NOTE — Telephone Encounter (Signed)
Returned call to Pt.  Advised of device information.  Gave device phone/fax number for him to give to his eye doctor in case they need a clearance form.  Pt thanked nurse for information.

## 2023-07-16 NOTE — Telephone Encounter (Signed)
Pt needs to know what kind of defib he has because he is have cataract Surgery. He would like a call back.

## 2023-07-18 ENCOUNTER — Telehealth: Payer: Self-pay

## 2023-07-18 NOTE — Telephone Encounter (Signed)
PAP: Patient assistance application for Stioloto has been approved by PAP Companies: [employer] from 07/17/2023 to 07/28/2024. Medication should be delivered to PAP Delivery: Home For further shipping updates, please contact [employer] ([employer]) at [phone] Pt ID is: [id number]

## 2023-07-18 NOTE — Telephone Encounter (Signed)
PAP: Application for Sherryll Burger  has been submitted TO  PAP Companies: [employer], via fax   PLEASE BE ADVISED NEEDED PROOF OF  INCOME AND HOLD SIZE FILLED OUT  AND RESENT TO COMPANY.

## 2023-07-24 NOTE — Anesthesia Preprocedure Evaluation (Addendum)
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  Mid dn apical segments akinetic.  Mild anterior, mid anteroseptal, inferoseptal, inferior, inferolateral, anterolateral, apical anterior segment, apical septal segment, apical lateral segment and apex are akinetic  EF 20-25%, trivial MR.  Trivial TR. PASP 31 mm Hg    Neuro/Psych    GI/Hepatic   Endo/Other    Renal/GU      Musculoskeletal   Abdominal   Peds  Hematology   Anesthesia Other Findings Medical History   Ischemic cardiomyopathy  Sinus bradycardia HLD (hyperlipidemia) HTN (hypertension)  Ventricular tachycardia  ICD  single,BSX ' Coronary artery disease Syncope and collapse  HFrEF (heart failure with reduced ejection fraction COPD (chronic obstructive pulmonary disease) (HCC)  Anxiety Acute anterior wall MI (HCC)  Lateral wall myocardial infarction (HCC) NSTEMI (non-ST elevated myocardial infarction)  Long term current use of antithrombotics/antiplatelets Aortic atherosclerosis (HCC)  CHF (congestive heart failure)  Sepsis due to group A Streptococcus with acute renal failure and septic shock (HCC) Dizziness Insomnia  Tobacco abuse Sacroiliac pain NSTEMI (non-ST elevated myocardial infarction)  History of prostate surgery  Bilateral impacted cerumen  Baker's cyst Cellulitis of left foot  Acute cough Syncope and collapse Leukocytosis History of ventricular tachycardia     Reproductive/Obstetrics                              Anesthesia Physical Anesthesia  Plan  ASA: 4  Anesthesia Plan: MAC   Post-op Pain Management:    Induction: Intravenous  PONV Risk Score and Plan:   Airway Management Planned: Natural Airway and Nasal Cannula  Additional Equipment:   Intra-op Plan:   Post-operative Plan:   Informed Consent: I have reviewed the patients History and Physical, chart, labs and discussed the procedure including the risks, benefits and alternatives for the proposed anesthesia with the patient or authorized representative who has indicated his/her understanding and acceptance.     Dental Advisory Given  Plan Discussed with: Anesthesiologist, CRNA and Surgeon  Anesthesia Plan Comments: (Patient consented for risks of anesthesia including but not limited to:  - adverse reactions to medications - damage to eyes, teeth, lips or other oral mucosa - nerve damage due to positioning  - sore throat or hoarseness - Damage to heart, brain, nerves, lungs, other parts of body or loss of life  Patient voiced understanding and assent.)         Anesthesia Quick Evaluation

## 2023-07-28 ENCOUNTER — Encounter: Payer: Self-pay | Admitting: Ophthalmology

## 2023-07-29 ENCOUNTER — Ambulatory Visit: Payer: Medicare Other | Admitting: Physician Assistant

## 2023-07-30 HISTORY — PX: LITHOTRIPSY: SUR834

## 2023-08-04 NOTE — Discharge Instructions (Signed)

## 2023-08-06 ENCOUNTER — Ambulatory Visit
Admission: RE | Admit: 2023-08-06 | Discharge: 2023-08-06 | Disposition: A | Discharge status: Home / Self Care | Payer: Medicare Other | Source: Ambulatory Visit | Attending: Ophthalmology | Admitting: Ophthalmology

## 2023-08-06 ENCOUNTER — Encounter: Payer: Self-pay | Admitting: Ophthalmology

## 2023-08-06 ENCOUNTER — Encounter
Admission: RE | Disposition: A | Discharge status: Home / Self Care | Payer: Self-pay | Source: Ambulatory Visit | Attending: Ophthalmology

## 2023-08-06 ENCOUNTER — Other Ambulatory Visit: Payer: Self-pay

## 2023-08-06 ENCOUNTER — Ambulatory Visit: Payer: Medicare Other | Admitting: Anesthesiology

## 2023-08-06 DIAGNOSIS — I2510 Atherosclerotic heart disease of native coronary artery without angina pectoris: Secondary | ICD-10-CM | POA: Insufficient documentation

## 2023-08-06 DIAGNOSIS — H2512 Age-related nuclear cataract, left eye: Secondary | ICD-10-CM | POA: Insufficient documentation

## 2023-08-06 DIAGNOSIS — I25118 Atherosclerotic heart disease of native coronary artery with other forms of angina pectoris: Secondary | ICD-10-CM | POA: Diagnosis not present

## 2023-08-06 DIAGNOSIS — I130 Hypertensive heart and chronic kidney disease with heart failure and stage 1 through stage 4 chronic kidney disease, or unspecified chronic kidney disease: Secondary | ICD-10-CM | POA: Insufficient documentation

## 2023-08-06 DIAGNOSIS — Z955 Presence of coronary angioplasty implant and graft: Secondary | ICD-10-CM | POA: Diagnosis not present

## 2023-08-06 DIAGNOSIS — I5022 Chronic systolic (congestive) heart failure: Secondary | ICD-10-CM | POA: Insufficient documentation

## 2023-08-06 DIAGNOSIS — N183 Chronic kidney disease, stage 3 unspecified: Secondary | ICD-10-CM | POA: Insufficient documentation

## 2023-08-06 DIAGNOSIS — I700 Atherosclerosis of aorta: Secondary | ICD-10-CM | POA: Insufficient documentation

## 2023-08-06 DIAGNOSIS — Z95810 Presence of automatic (implantable) cardiac defibrillator: Secondary | ICD-10-CM | POA: Diagnosis not present

## 2023-08-06 DIAGNOSIS — H269 Unspecified cataract: Secondary | ICD-10-CM | POA: Diagnosis not present

## 2023-08-06 DIAGNOSIS — J449 Chronic obstructive pulmonary disease, unspecified: Secondary | ICD-10-CM | POA: Insufficient documentation

## 2023-08-06 DIAGNOSIS — Z7902 Long term (current) use of antithrombotics/antiplatelets: Secondary | ICD-10-CM | POA: Diagnosis not present

## 2023-08-06 DIAGNOSIS — I252 Old myocardial infarction: Secondary | ICD-10-CM | POA: Insufficient documentation

## 2023-08-06 DIAGNOSIS — Z87891 Personal history of nicotine dependence: Secondary | ICD-10-CM | POA: Insufficient documentation

## 2023-08-06 DIAGNOSIS — N1831 Chronic kidney disease, stage 3a: Secondary | ICD-10-CM | POA: Diagnosis not present

## 2023-08-06 HISTORY — DX: Abnormal findings on diagnostic imaging of heart and coronary circulation: R93.1

## 2023-08-06 HISTORY — DX: Presence of automatic (implantable) cardiac defibrillator: Z95.810

## 2023-08-06 HISTORY — DX: Abnormal result of cardiovascular function study, unspecified: R94.30

## 2023-08-06 HISTORY — PX: CATARACT EXTRACTION W/PHACO: SHX586

## 2023-08-06 SURGERY — PHACOEMULSIFICATION, CATARACT, WITH IOL INSERTION
Anesthesia: Monitor Anesthesia Care | Site: Eye | Laterality: Left

## 2023-08-06 MED ORDER — FENTANYL CITRATE (PF) 100 MCG/2ML IJ SOLN
INTRAMUSCULAR | Status: DC | PRN
  Administered 2023-08-06: 50 ug via INTRAVENOUS

## 2023-08-06 MED ORDER — SIGHTPATH DOSE#1 BSS IO SOLN
INTRAOCULAR | Status: DC | PRN
  Administered 2023-08-06: 1 mL

## 2023-08-06 MED ORDER — TETRACAINE HCL 0.5 % OP SOLN
OPHTHALMIC | Status: AC
  Filled 2023-08-06: qty 4

## 2023-08-06 MED ORDER — SIGHTPATH DOSE#1 BSS IO SOLN
INTRAOCULAR | Status: DC | PRN
  Administered 2023-08-06: 15 mL via INTRAOCULAR

## 2023-08-06 MED ORDER — SIGHTPATH DOSE#1 BSS IO SOLN
INTRAOCULAR | Status: DC | PRN
  Administered 2023-08-06: 69 mL via OPHTHALMIC

## 2023-08-06 MED ORDER — MIDAZOLAM HCL 2 MG/2ML IJ SOLN
INTRAMUSCULAR | Status: AC
  Filled 2023-08-06: qty 2

## 2023-08-06 MED ORDER — TETRACAINE HCL 0.5 % OP SOLN
1.0000 [drp] | OPHTHALMIC | Status: DC | PRN
  Administered 2023-08-06 (×3): 1 [drp] via OPHTHALMIC

## 2023-08-06 MED ORDER — FENTANYL CITRATE (PF) 100 MCG/2ML IJ SOLN
INTRAMUSCULAR | Status: AC
  Filled 2023-08-06: qty 2

## 2023-08-06 MED ORDER — ARMC OPHTHALMIC DILATING DROPS
OPHTHALMIC | Status: AC
  Filled 2023-08-06: qty 0.5

## 2023-08-06 MED ORDER — MIDAZOLAM HCL 2 MG/2ML IJ SOLN
INTRAMUSCULAR | Status: DC | PRN
  Administered 2023-08-06 (×2): 1 mg via INTRAVENOUS

## 2023-08-06 MED ORDER — ARMC OPHTHALMIC DILATING DROPS
1.0000 | OPHTHALMIC | Status: DC | PRN
  Administered 2023-08-06 (×3): 1 via OPHTHALMIC

## 2023-08-06 MED ORDER — BRIMONIDINE TARTRATE-TIMOLOL 0.2-0.5 % OP SOLN
OPHTHALMIC | Status: DC | PRN
  Administered 2023-08-06: 1 [drp] via OPHTHALMIC

## 2023-08-06 MED ORDER — SIGHTPATH DOSE#1 NA HYALUR & NA CHOND-NA HYALUR IO KIT
PACK | INTRAOCULAR | Status: DC | PRN
  Administered 2023-08-06: 1 via OPHTHALMIC

## 2023-08-06 MED ORDER — CEFUROXIME OPHTHALMIC INJECTION 1 MG/0.1 ML
INJECTION | OPHTHALMIC | Status: DC | PRN
  Administered 2023-08-06: .1 mL via INTRACAMERAL

## 2023-08-06 SURGICAL SUPPLY — 8 items
CATARACT SUITE SIGHTPATH (MISCELLANEOUS) ×1
FEE CATARACT SUITE SIGHTPATH (MISCELLANEOUS) ×1 IMPLANT
GLOVE SRG 8 PF TXTR STRL LF DI (GLOVE) ×1 IMPLANT
GLOVE SURG ENC TEXT LTX SZ7.5 (GLOVE) ×1 IMPLANT
LENS IOL TECNIS EYHANCE 21.5 (Intraocular Lens) IMPLANT
NDL FILTER BLUNT 18X1 1/2 (NEEDLE) ×1 IMPLANT
NEEDLE FILTER BLUNT 18X1 1/2 (NEEDLE) ×1
SYR 3ML LL SCALE MARK (SYRINGE) ×1 IMPLANT

## 2023-08-06 NOTE — Anesthesia Postprocedure Evaluation (Signed)
 Anesthesia Post Note  Patient: BRENTT FREAD  Procedure(s) Performed: CATARACT EXTRACTION PHACO AND INTRAOCULAR LENS PLACEMENT (IOC) LEFT 4.61, 00:28.3 (Left: Eye)  Patient location during evaluation: PACU Anesthesia Type: MAC Level of consciousness: awake and alert Pain management: pain level controlled Vital Signs Assessment: post-procedure vital signs reviewed and stable Respiratory status: spontaneous breathing, nonlabored ventilation, respiratory function stable and patient connected to nasal cannula oxygen  Cardiovascular status: stable and blood pressure returned to baseline Postop Assessment: no apparent nausea or vomiting Anesthetic complications: no   No notable events documented.   Last Vitals:  Vitals:   08/06/23 0954 08/06/23 0959  BP: 99/74 (!) 104/48  Pulse: 66 66  Resp: 11 18  Temp: (!) 36.1 C   SpO2: 98% 97%    Last Pain:  Vitals:   08/06/23 0959  TempSrc:   PainSc: 0-No pain                 Donny JAYSON Mu

## 2023-08-06 NOTE — Op Note (Signed)
 OPERATIVE NOTE  SHEY BARTMESS [id number] 08/06/2023   PREOPERATIVE DIAGNOSIS:  Nuclear sclerotic cataract left eye. H25.12   POSTOPERATIVE DIAGNOSIS:    Nuclear sclerotic cataract left eye.     PROCEDURE:  Phacoemusification with posterior chamber intraocular lens placement of the left eye  Ultrasound time: Procedure(s): CATARACT EXTRACTION PHACO AND INTRAOCULAR LENS PLACEMENT (IOC) LEFT 4.61, 00:28.3 (Left)  LENS:   Implant Name Type Inv. Item Serial No. Manufacturer Lot No. LRB No. Used Action  LENS IOL TECNIS EYHANCE 21.5 - [id number] Intraocular Lens LENS IOL TECNIS EYHANCE 21.5 [id number] [employer]  Left 1 Implanted      SURGEON:  Dene FABIENE Etienne, MD   ANESTHESIA:  Topical with tetracaine  drops and 2% Xylocaine  jelly, augmented with 1% preservative-free intracameral lidocaine .    COMPLICATIONS:  None.   DESCRIPTION OF PROCEDURE:  The patient was identified in the holding room and transported to the operating room and placed in the supine position under the operating microscope.  The left eye was identified as the operative eye and it was prepped and draped in the usual sterile ophthalmic fashion.   A 1 millimeter clear-corneal paracentesis was made at the 1:30 position.  0.5 ml of preservative-free 1% lidocaine  was injected into the anterior chamber.  The anterior chamber was filled with Viscoat viscoelastic.  A 2.4 millimeter keratome was used to make a near-clear corneal incision at the 10:30 position.  .  A curvilinear capsulorrhexis was made with a cystotome and capsulorrhexis forceps.  Balanced salt solution was used to hydrodissect and hydrodelineate the nucleus.   Phacoemulsification was then used in stop and chop fashion to remove the lens nucleus and epinucleus.  The remaining cortex was then removed using the irrigation and aspiration handpiece. Provisc was then placed into the capsular bag to distend it for lens placement.  A lens was then injected into the  capsular bag.  The remaining viscoelastic was aspirated.   Wounds were hydrated with balanced salt solution.  The anterior chamber was inflated to a physiologic pressure with balanced salt solution.  No wound leaks were noted. Cefuroxime  0.1 ml of a 10mg /ml solution was injected into the anterior chamber for a dose of 1 mg of intracameral antibiotic at the completion of the case.   Timolol  and Brimonidine  drops were applied to the eye.  The patient was taken to the recovery room in stable condition without complications of anesthesia or surgery.  Lowry Bala 08/06/2023, 9:52 AM

## 2023-08-06 NOTE — H&P (Signed)
 [facility]   Primary Care Physician:  Hope Merle, MD Ophthalmologist: Dr. Dene Etienne  Pre-Procedure History & Physical: HPI:  Ian Mills is a 68 y.o. male here for ophthalmic surgery.   Past Medical History:  Diagnosis Date   Acute anterior wall MI (HCC) 07/04/1991   a.) LHC: EF 29%; 100% mLAD -> PTCA performed   Acute cough 08/16/2022   Anxiety    Aortic atherosclerosis (HCC)    Baker's cyst 06/10/2019   5.3 cm 01/2018 noted US  no repair as of 06/10/2019      Bilateral impacted cerumen 06/10/2019   Cellulitis of left foot 05/10/2022   CHF (congestive heart failure) (HCC)    COPD (chronic obstructive pulmonary disease) (HCC)    Coronary artery disease    a.) 3 stents --> 3x18 mm Duet to OM3, 2.5x18 mm Duet to D1, 2.5x13 mm Duet to RPDA. b. 06/2013 PCI: CTO LAD, patent LAD stent, RCA 70p (FFR 0.74--> DES), 56m (DES), RPDA 50%, EF 20%; c. 08/2019 Cath: LM nl, LAD 100p/m, D1 60, D2 patent stent, LCX 60ost/p, 30m ISR, RCA 30p, 25m ISR.   Dizziness 03/30/2015   Family history of lung cancer    HFrEF (heart failure with reduced ejection fraction) (HCC)    a. 05/2014 Echo: EF 25-30%; b. 01/2018 Echo ([facility]): EF 30%; c. 06/2021 Echo: EF 25-30%, apical AK, GrI DD, nl RV fxn.   History of prostate surgery 06/09/2020   History of ventricular tachycardia 01/23/2012   HLD (hyperlipidemia)    HTN (hypertension)    ICD  single,BSX    a. DOI 11/2011; b. S/N# 894767   Insomnia 06/01/2014   Ischemic cardiomyopathy    a. 05/2014 Echo: EF 25-30%; b. 01/2018 Echo ([facility]): EF 30%, c. 06/2021 Echo: EF 25-30%.   Lateral wall myocardial infarction (HCC) 06/29/1997   a.) LHC: EF 44%; 75% RPDA and 95% OM3; PCI performed and a 3.0 x 18 mm Duet stent placed to OM3   Leukocytosis 08/16/2022   Long term current use of antithrombotics/antiplatelets    a.) DAPT therapy (ASA + clopidogrel )   Low left ventricular ejection fraction    NSTEMI (non-ST elevated myocardial infarction) (HCC)  11/27/2011   a.) LHC: 100% mLAD; intervention deferred opting for medical management.   NSTEMI (non-ST elevated myocardial infarction) (HCC) 06/23/2016   Personal history of colonic polyps    Pneumonia 08/2018   Sacroiliac pain 07/31/2016   Formatting of this note might be different from the original. Added automatically from request for surgery [id number]   Sepsis (HCC) 07/29/2021   Sepsis due to group A Streptococcus with acute renal failure and septic shock (HCC)    Syncope and collapse    Syncope and collapse 09/26/2010   Qualifier: Diagnosis of   By: Perla MD, Tim       Tobacco abuse 06/13/2016   URI (upper respiratory infection) 03/17/2022   Ventricular tachycardia (HCC) 12/01/2011   a.) VT arrest; ROSC achieved --> [employer] AICD placed.    Past Surgical History:  Procedure Laterality Date   APPENDECTOMY N/A    CARDIAC DEFIBRILLATOR PLACEMENT N/A 12/01/2011   COLONOSCOPY WITH PROPOFOL  N/A 12/22/2020   Procedure: COLONOSCOPY WITH PROPOFOL ;  Surgeon: Therisa Bi, MD;  Location: [facility] ENDOSCOPY;  Service: Gastroenterology;  Laterality: N/A;   CORONARY ANGIOPLASTY Left 07/05/1991   Procedure: CORONARY ANGIOPLASTY; Location: [facility]; Surgeon: Miquel Ellen, MD   CORONARY ANGIOPLASTY Left 08/06/1991   Procedure: CORONARY ANGIOPLASTY; Location: [facility]; Surgeon: Steffan Score, MD   CORONARY  ANGIOPLASTY Left 08/09/1991   Procedure: CORONARY ANGIOPLASTY; Location: [facility]; Surgeon: Steffan Score, MD   CORONARY ANGIOPLASTY Left 10/28/1991   Procedure: CORONARY ANGIOPLASTY; Location: [facility]; Surgeon: Steffan Score, MD   CORONARY ANGIOPLASTY Left 11/02/1991   Procedure: CORONARY ANGIOPLASTY; Location: [facility]; Surgeon: Steffan Score, MD   CORONARY ANGIOPLASTY Left 01/10/1992   Procedure: CORONARY ANGIOPLASTY; Location: [facility]; Surgeon: Charlie Ross, MD   CORONARY ANGIOPLASTY Left 09/26/1993   Procedure: CORONARY ANGIOPLASTY; Location: [facility]; Surgeon: Charlie Hasten, MD   CORONARY ANGIOPLASTY Left  09/11/2006   Procedure: CORONARY ANGIOPLASTY; Location: [facility]   CORONARY ANGIOPLASTY Left 01/17/2010   Procedure: CORONARY ANGIOPLASTY; Location: [facility]; Surgeon: Prentice Risk, MD   CORONARY ANGIOPLASTY Left 11/27/2011   Procedure: CORONARY ANGIOPLASTY; Location: [facility]; Surgeon: Prentice Flatten, MD   CORONARY ANGIOPLASTY WITH STENT PLACEMENT Left 06/29/1997   Procedure: CORONARY ANGIOPLASTY WITH STENT PLACEMENT (3.0 x 18 mm Diet stent to OM3); Location: [facility]; Surgeon: Clovis Zidar, MD   CORONARY ANGIOPLASTY WITH STENT PLACEMENT Left 07/12/1998   Procedure: CORONARY ANGIOPLASTY WITH STENT PLACEMENT (2.8 x 8 mm Duet stent to D1, 2.5 x 13 mm Duet stent to RPDA); Location: [facility]   GRAFT APPLICATION Left 08/24/2021   Procedure: GRAFT APPLICATION;  Surgeon: Tye Millet, DO;  Location: [facility] ORS;  Service: General;  Laterality: Left;   LEFT HEART CATH AND CORONARY ANGIOGRAPHY Left 08/30/2019   Procedure: LEFT HEART CATH AND CORONARY ANGIOGRAPHY;  Surgeon: Darron Deatrice LABOR, MD;  Location: [facility] INVASIVE CV LAB;  Service: Cardiovascular;  Laterality: Left;   PROSTATE SURGERY     urethra grew around prostate    TUMOR REMOVAL  2001   chest thymic cyst; behind lungs [facility] benign     Prior to Admission medications   Medication Sig Start Date End Date Taking? Authorizing Provider  ALPRAZolam  (XANAX ) 0.5 MG tablet Take 1 tablet (0.5 mg total) by mouth 3 (three) times daily as needed. for anxiety 05/16/23  Yes Hope Merle, MD  aspirin  EC 81 MG tablet Take 81 mg by mouth daily. Swallow whole.   Yes [provider]  atorvastatin  (LIPITOR) 80 MG tablet Take 1 tablet (80 mg total) by mouth daily. 06/30/23  Yes Hammock, Tylene, NP  carvedilol  (COREG ) 3.125 MG tablet Take 1 tablet (3.125 mg total) by mouth 2 (two) times daily. 06/30/23  Yes Hammock, Tylene, NP  clopidogrel  (PLAVIX ) 75 MG tablet Take 1 tablet (75 mg total) by mouth daily. 06/30/23  Yes Hammock, Tylene, NP  dapagliflozin  propanediol (FARXIGA ) 10 MG TABS  tablet Take 1 tablet (10 mg total) by mouth daily before breakfast. 03/13/22  Yes [place] A, FNP  ezetimibe  (ZETIA ) 10 MG tablet Take 1 tablet (10 mg total) by mouth daily. 06/30/23  Yes Hammock, Sheri, NP  potassium chloride  (KLOR-CON  10) 10 MEQ tablet Take 1 tablet (10 mEq total) by mouth daily. 06/30/23  Yes Hammock, Tylene, NP  sacubitril -valsartan  (ENTRESTO ) 49-51 MG Take 1 tablet by mouth 2 (two) times daily. 10/22/22  Yes Gollan, Timothy J, MD  spironolactone  (ALDACTONE ) 25 MG tablet Take 1 tablet (25 mg total) by mouth daily. 06/30/23  Yes Hammock, Tylene, NP  tadalafil  (CIALIS ) 20 MG tablet Take 0.5-1 tablets (10-20 mg total) by mouth daily as needed for erectile dysfunction (take 1 hour prior to sexual activity). 10/09/22  Yes Francisca Redell BROCKS, MD  Tiotropium Bromide -Olodaterol (STIOLTO RESPIMAT ) 2.5-2.5 MCG/ACT AERS Inhale 2 puffs into the lungs daily. 02/08/22  Yes McLean-Scocuzza, Randine SAILOR, MD  torsemide  (DEMADEX ) 20 MG tablet Take 1 tablet (20 mg  total) by mouth as needed. 06/30/23 09/28/23 Yes Hammock, Tylene, NP  albuterol  (VENTOLIN  HFA) 108 (90 Base) MCG/ACT inhaler Inhale 1-2 puffs into the lungs every 6 (six) hours as needed for wheezing or shortness of breath. Patient not taking: Reported on 07/28/2023 02/08/22   McLean-Scocuzza, Randine SAILOR, MD  ipratropium-albuterol  (DUONEB) 0.5-2.5 (3) MG/3ML SOLN Take 3 mLs by nebulization every 6 (six) hours as needed. Patient not taking: Reported on 07/28/2023 08/07/22   Lang Dover, MD    Allergies as of 07/09/2023 - Review Complete 06/30/2023  Allergen Reaction Noted   Nitroglycerin  Nausea Only and Other (See Comments) 01/23/2012    Family History  Problem Relation Age of Onset   Cancer - Lung Mother    Hypertension Mother    Heart attack Mother    Cancer Father        larynx   Dementia Maternal Grandmother    Heart attack Paternal Grandfather    Colon cancer Neg Hx    Stomach cancer Neg Hx    Pancreatic cancer Neg Hx     Social  History   Socioeconomic History   Marital status: Married    Spouse name: Adrien    Number of children: 2   Years of education: Not on file   Highest education level: Not on file  Occupational History   Occupation: retired   Tobacco Use   Smoking status: Former    Current packs/day: 0.00    Average packs/day: 0.5 packs/day for 42.0 years (21.0 ttl pk-yrs)    Types: Cigarettes    Start date: 11/09/1977    Quit date: 11/10/2019    Years since quitting: 3.7   Smokeless tobacco: Never  Vaping Use   Vaping status: Never Used  Substance and Sexual Activity   Alcohol  use: Yes    Alcohol /week: 1.0 standard drink of alcohol     Types: 1 Cans of beer per week    Comment: Rare   Drug use: No   Sexual activity: Not on file  Other Topics Concern   Not on file  Social History Narrative   Separated   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    Lives in [place]    2 kids daughters    Gets regular exercise   Smoker   [employer]    Social Drivers of Health   Financial Resource Strain: Low Risk  (05/07/2023)   Overall Financial Resource Strain (CARDIA)    Difficulty of Paying Living Expenses: Not hard at all  Food Insecurity: No Food Insecurity (05/07/2023)   Hunger Vital Sign    Worried About Running Out of Food in the Last Year: Never true    Ran Out of Food in the Last Year: Never true  Transportation Needs: No Transportation Needs (05/07/2023)   PRAPARE - Administrator, Civil Service (Medical): No    Lack of Transportation (Non-Medical): No  Physical Activity: Insufficiently Active (05/07/2023)   Exercise Vital Sign    Days of Exercise per Week: 7 days    Minutes of Exercise per Session: 20 min  Stress: No Stress Concern Present (05/07/2023)   [employer] of Occupational Health - Occupational Stress Questionnaire    Feeling of Stress : Only a little  Social Connections: Moderately Isolated (05/07/2023)   Social Connection  and Isolation Panel [NHANES]    Frequency of Communication with Friends and Family: More than three times a week    Frequency of Social Gatherings  with Friends and Family: More than three times a week    Attends Religious Services: More than 4 times per year    Active Member of Clubs or Organizations: No    Attends Banker Meetings: Never    Marital Status: Separated  Intimate Partner Violence: Not At Risk (05/07/2023)   Humiliation, Afraid, Rape, and Kick questionnaire    Fear of Current or Ex-Partner: No    Emotionally Abused: No    Physically Abused: No    Sexually Abused: No    Review of Systems: See HPI, otherwise negative ROS  Physical Exam: BP 110/70   Pulse 65   Temp (!) 97 F (36.1 C) (Temporal)   Resp 15   Ht 5' 10 (1.778 m)   Wt 91.4 kg   SpO2 99%   BMI 28.91 kg/m  General:   Alert,  pleasant and cooperative in NAD Head:  Normocephalic and atraumatic. Lungs:  Clear to auscultation.    Heart:  Regular rate and rhythm.   Impression/Plan: Ian Mills is here for ophthalmic surgery.  Risks, benefits, limitations, and alternatives regarding ophthalmic surgery have been reviewed with the patient.  Questions have been answered.  All parties agreeable.   MITTIE GASKIN, MD  08/06/2023, 8:54 AM

## 2023-08-06 NOTE — Transfer of Care (Signed)
 Immediate Anesthesia Transfer of Care Note  Patient: Ian Mills  Procedure(s) Performed: CATARACT EXTRACTION PHACO AND INTRAOCULAR LENS PLACEMENT (IOC) LEFT 4.61, 00:28.3 (Left: Eye)  Patient Location: PACU  Anesthesia Type: MAC  Level of Consciousness: awake, alert  and patient cooperative  Airway and Oxygen  Therapy: Patient Spontanous Breathing and Patient connected to supplemental oxygen   Post-op Assessment: Post-op Vital signs reviewed, Patient's Cardiovascular Status Stable, Respiratory Function Stable, Patent Airway and No signs of Nausea or vomiting  Post-op Vital Signs: Reviewed and stable  Complications: No notable events documented.

## 2023-08-07 NOTE — Telephone Encounter (Signed)
 RE Faxed application FOR MISSING DATE ON PROVIDER PG AND PT PAGE to [employer] at [phone]   PLEASE BE ADVISED

## 2023-08-09 ENCOUNTER — Encounter: Payer: Self-pay | Admitting: Ophthalmology

## 2023-08-13 ENCOUNTER — Telehealth: Payer: Self-pay

## 2023-08-13 NOTE — Telephone Encounter (Signed)
 PAP: Patient assistance application [employer] for has been approved by PAP Companies: [employer] from 07/30/2023 to 07/28/2024. Medication should be delivered to PAP Delivery: Home For further shipping updates, please contact [employer] ([employer]) at [phone] Pt ID is: No ID

## 2023-08-21 ENCOUNTER — Other Ambulatory Visit: Payer: Self-pay | Admitting: *Deleted

## 2023-08-21 MED ORDER — DAPAGLIFLOZIN PROPANEDIOL 10 MG PO TABS: 10.0000 mg | ORAL_TABLET | Freq: Every day | ORAL | 3 refills | Status: DC

## 2023-08-27 ENCOUNTER — Telehealth: Payer: Self-pay

## 2023-08-27 NOTE — Telephone Encounter (Signed)
Copied from CRM [id number]. Topic: Clinical - Request for Lab/Test Order >> Aug 27, 2023 11:18 AM [name] wrote: Reason for CRM: Patient is calling to see if Dr.Walsh can place the order for his yearly lung screening. Please call patient to [phone] and provider phone number to scheduling for the test.

## 2023-08-28 ENCOUNTER — Telehealth: Payer: Self-pay

## 2023-08-28 DIAGNOSIS — F17210 Nicotine dependence, cigarettes, uncomplicated: Secondary | ICD-10-CM

## 2023-08-28 DIAGNOSIS — Z122 Encounter for screening for malignant neoplasm of respiratory organs: Secondary | ICD-10-CM

## 2023-08-28 DIAGNOSIS — Z87891 Personal history of nicotine dependence: Secondary | ICD-10-CM

## 2023-08-28 NOTE — Telephone Encounter (Signed)
They have been trying to reach patient since 08/21/23 & mailed him a letter. I also informed pt and gave him the number to call. Order is already there.

## 2023-08-28 NOTE — Telephone Encounter (Signed)
.  Lung Cancer Screening Narrative/Criteria Questionnaire (Cigarette Smokers Only- No Cigars/Pipes/vapes)   Ian Mills   SDMV:09/09/2023 at 9:30am with Marcelline Mates, RN   11-12-1955   LDCT: 09/09/2023 at 1:00pm @ [facility]    68 y.o.   Phone: [phone]  Lung Screening Narrative (confirm age 41-77 yrs Medicare / 50-80 yrs Private pay insurance)   Insurance information:Medicare   Referring Provider:Walsh   This screening involves an initial phone call with a team member from our program. It is called a shared decision making visit. The initial meeting is required by  insurance and Medicare to make sure you understand the program. This appointment takes about 15-20 minutes to complete. You will complete the screening scan at your scheduled date/time.  This scan takes about 5-10 minutes to complete. You can eat and drink normally before and after the scan.  Criteria questions for Lung Cancer Screening:   Are you a current or former smoker? Current Age began smoking: 54   If you are a former smoker, what year did you quit smoking? Quit 5 years (within 15 yrs)   To calculate your smoking history, I need an accurate estimate of how many packs of cigarettes you smoked per day and for how many years. (Not just the number of PPD you are now smoking)   Years smoking 48 x Packs per day 1.5 = Pack years 72   (at least 20 pack yrs)   (Make sure they understand that we need to know how much they have smoked in the past, not just the number of PPD they are smoking now)  Do you have a personal history of cancer?  No    Do you have a family history of cancer? Yes  (cancer type and and relative) Father throat Mother lung  Are you coughing up blood?  Yes  Have you had unexplained weight loss of 15 lbs or more in the last 6 months? No  It looks like you meet all criteria.  When would be a good time for [place] to schedule you for this screening?   Additional information:

## 2023-09-01 ENCOUNTER — Telehealth: Payer: Self-pay | Admitting: Acute Care

## 2023-09-01 NOTE — Telephone Encounter (Signed)
Returned call. Advised we did not reach back out since call on 08/28/2023. No additional needs. Patient verbalized understanding.

## 2023-09-01 NOTE — Telephone Encounter (Signed)
Patient is returning phone call. Patient phone number is [phone].

## 2023-09-03 ENCOUNTER — Telehealth: Payer: Self-pay | Admitting: Cardiovascular Disease

## 2023-09-03 NOTE — Telephone Encounter (Signed)
 Ian Mills is calling about patient assistance application. She states pt dropped it off yesterday and it needs to be faxed over back to her. Please advise on the status of this.   I did reach out to front desk reps but we still aren't able to locate paperwork.   Fax: [phone]

## 2023-09-04 NOTE — Telephone Encounter (Signed)
 Spoke with pharmacist and she needs fax for this patient. Checked with ladies up front and also my files. I do not have it. Message sent to pharmacy  Sent secure chat that we don't have any forms for this patient. She was appreciative with no further questions or needs at this time.

## 2023-09-04 NOTE — Telephone Encounter (Signed)
 Left voicemail message to call back in reference to cost of her medication.  We have not received documentation needed, He will come into office to assist with SSA.gov to see if she would qualify

## 2023-09-05 ENCOUNTER — Telehealth: Payer: Self-pay

## 2023-09-05 DIAGNOSIS — M47816 Spondylosis without myelopathy or radiculopathy, lumbar region: Secondary | ICD-10-CM | POA: Diagnosis not present

## 2023-09-05 DIAGNOSIS — M5416 Radiculopathy, lumbar region: Secondary | ICD-10-CM | POA: Diagnosis not present

## 2023-09-05 NOTE — Telephone Encounter (Signed)
 Application sent to [employer] for re-enrollment on 07/08/23- needed updated income document from patient. Sent application with new Income document on 09-05-2023.

## 2023-09-08 ENCOUNTER — Ambulatory Visit (INDEPENDENT_AMBULATORY_CARE_PROVIDER_SITE_OTHER): Payer: Medicare Other | Admitting: Acute Care

## 2023-09-08 ENCOUNTER — Ambulatory Visit (INDEPENDENT_AMBULATORY_CARE_PROVIDER_SITE_OTHER): Payer: Medicare Other

## 2023-09-08 DIAGNOSIS — F17210 Nicotine dependence, cigarettes, uncomplicated: Secondary | ICD-10-CM | POA: Diagnosis not present

## 2023-09-08 DIAGNOSIS — I472 Ventricular tachycardia, unspecified: Secondary | ICD-10-CM

## 2023-09-08 NOTE — Telephone Encounter (Signed)
 PAP: Patient assistance application for Entresto  has been approved by PAP Companies: [employer] from 09/05/2023 to 07/28/2024. Medication should be delivered to PAP Delivery: Home. For further shipping updates, please contact [employer] at [phone]. Patient ID is: [id number]   PLEASE BE ADVISED LETTER OF APPROVAL HAS BEEN SCANNED INTO MEDIA

## 2023-09-08 NOTE — Progress Notes (Addendum)
 Provider Attestation I agree with the documentation of the Shared Decision Making visit,  smoking cessation counseling if appropriate, and verification or eligibility for lung cancer screening as documented by the RN Nurse Navigator.   Raejean Bullock, MSN, AGACNP-BC [facility] See Amion for personal pager PCCM on call pager [phone]      Virtual Visit via Telephone Note  I connected with Ian Mills on 09/08/23 at  9:30 AM EST by telephone and verified that I am speaking with the correct person using two identifiers.  Location: Patient: Ian Mills Provider: Alyse Bach, RN   I discussed the limitations, risks, security and privacy concerns of performing an evaluation and management service by telephone and the availability of in person appointments. I also discussed with the patient that there may be a patient responsible charge related to this service. The patient expressed understanding and agreed to proceed.   Shared Decision Making Visit Lung Cancer Screening Program [phone])   Eligibility: Age 68 y.o. Pack Years Smoking History Calculation 21 (# packs/per year x # years smoked) Recent History of coughing up blood  no Unexplained weight loss? no ( >Than 15 pounds within the last 6 months ) Prior History Lung / other cancer no (Diagnosis within the last 5 years already requiring surveillance chest CT Scans). Smoking Status Current Smoker Former Smokers: Years since quit: n/a  Quit Date: n/a  Visit Components: Discussion included one or more decision making aids. yes Discussion included risk/benefits of screening. yes Discussion included potential follow up diagnostic testing for abnormal scans. yes Discussion included meaning and risk of over diagnosis. yes Discussion included meaning and risk of False Positives. yes Discussion included meaning of total radiation exposure. yes  Counseling Included: Importance of adherence  to annual lung cancer LDCT screening. yes Impact of comorbidities on ability to participate in the program. yes Ability and willingness to under diagnostic treatment. yes  Smoking Cessation Counseling: Current Smokers:  Discussed importance of smoking cessation. yes Information about tobacco cessation classes and interventions provided to patient. yes Patient provided with "ticket" for LDCT Scan. no Symptomatic Patient. no  Counseling(Intermediate counseling: > three minutes) 99406 Diagnosis Code: Tobacco Use Z72.0 Asymptomatic Patient yes  Counseling (Intermediate counseling: > three minutes counseling) B1478 Former Smokers:  Discussed the importance of maintaining cigarette abstinence. yes Diagnosis Code: Personal History of Nicotine  Dependence. G95.621 Information about tobacco cessation classes and interventions provided to patient. Yes Patient provided with "ticket" for LDCT Scan. no Written Order for Lung Cancer Screening with LDCT placed in Epic. Yes (CT Chest Lung Cancer Screening Low Dose W/O CM) [id number] Z12.2-Screening of respiratory organs Z87.891-Personal history of nicotine  dependence   Alyse Bach, RN

## 2023-09-08 NOTE — Patient Instructions (Signed)

## 2023-09-09 ENCOUNTER — Ambulatory Visit (INDEPENDENT_AMBULATORY_CARE_PROVIDER_SITE_OTHER)
Admission: RE | Admit: 2023-09-09 | Discharge: 2023-09-09 | Disposition: A | Discharge status: Home / Self Care | Payer: Medicare Other | Source: Ambulatory Visit | Attending: Internal Medicine | Admitting: Internal Medicine

## 2023-09-09 DIAGNOSIS — Z122 Encounter for screening for malignant neoplasm of respiratory organs: Secondary | ICD-10-CM

## 2023-09-09 DIAGNOSIS — F17210 Nicotine dependence, cigarettes, uncomplicated: Secondary | ICD-10-CM | POA: Diagnosis not present

## 2023-09-09 DIAGNOSIS — Z87891 Personal history of nicotine dependence: Secondary | ICD-10-CM

## 2023-09-09 LAB — CUP PACEART REMOTE DEVICE CHECK
Battery Remaining Longevity: 12 mo
Battery Remaining Percentage: 13 %
Brady Statistic RV Percent Paced: 0 %
Date Time Interrogation Session: 20250211022200
HighPow Impedance: 90 Ohm
Implantable Lead Connection Status: 753985
Implantable Lead Implant Date: 20130503
Implantable Lead Location: 753860
Implantable Lead Model: 292
Implantable Lead Serial Number: 112568
Implantable Pulse Generator Implant Date: 20130503
Lead Channel Impedance Value: 681 Ohm
Lead Channel Pacing Threshold Amplitude: 0.5 V
Lead Channel Pacing Threshold Pulse Width: 0.5 ms
Lead Channel Setting Pacing Amplitude: 2.4 V
Lead Channel Setting Pacing Pulse Width: 0.5 ms
Lead Channel Setting Sensing Sensitivity: 0.6 mV
Pulse Gen Serial Number: 105232
Zone Setting Status: 755011

## 2023-09-24 ENCOUNTER — Other Ambulatory Visit: Payer: Self-pay

## 2023-09-24 DIAGNOSIS — Z122 Encounter for screening for malignant neoplasm of respiratory organs: Secondary | ICD-10-CM

## 2023-09-24 DIAGNOSIS — Z87891 Personal history of nicotine dependence: Secondary | ICD-10-CM

## 2023-09-24 DIAGNOSIS — F17210 Nicotine dependence, cigarettes, uncomplicated: Secondary | ICD-10-CM

## 2023-09-29 ENCOUNTER — Encounter: Payer: Self-pay | Admitting: Internal Medicine

## 2023-10-06 IMAGING — CT CT HEAD W/O CM
4 series · 17 of 47 positions shown, 19 images · non-contrast
Comparison: Head CT 08/23/2019.

CLINICAL DATA: Head trauma with hypotension and dizziness.

EXAM:
CT HEAD WITHOUT CONTRAST
TECHNIQUE: Contiguous axial images were obtained from the base of the skull
through the vertex without intravenous contrast.

[Series 2: head wo · axial · 0.47mm/px · z∈[-108,+17]mm · 7 of 35 slices shown, 9 images]
[im 5/35  brain]
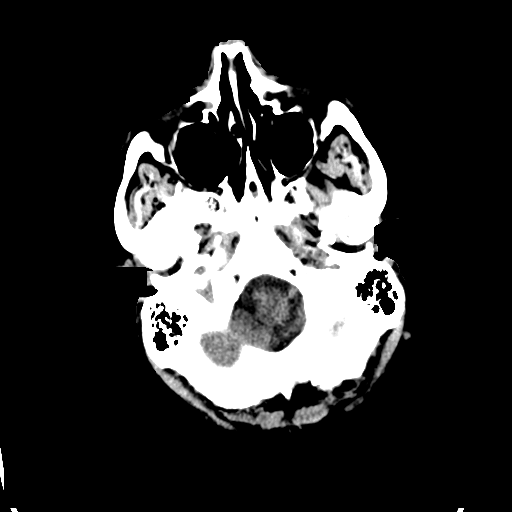
[im 5/35  bone]
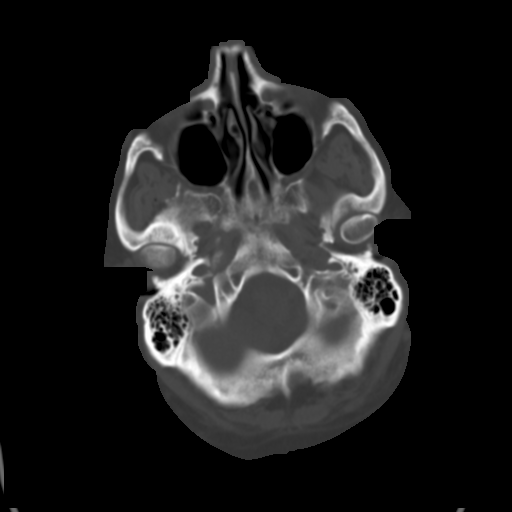
[im 9/35  brain]
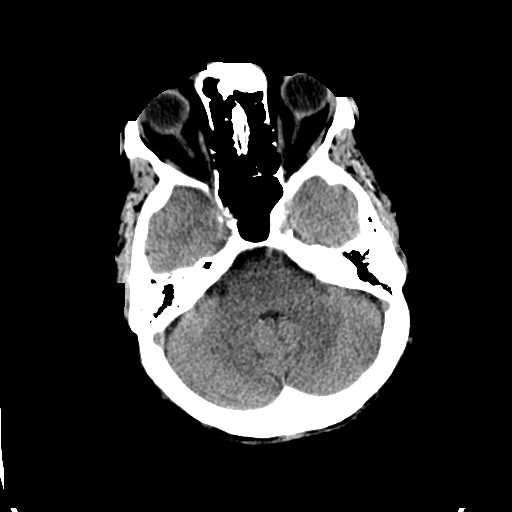
[im 13/35  brain]
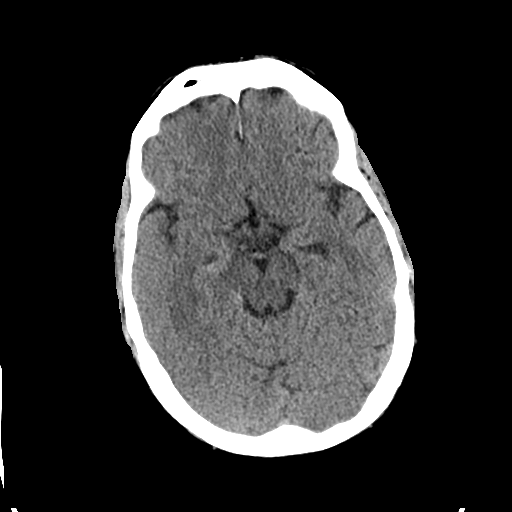
[im 18/35  brain]
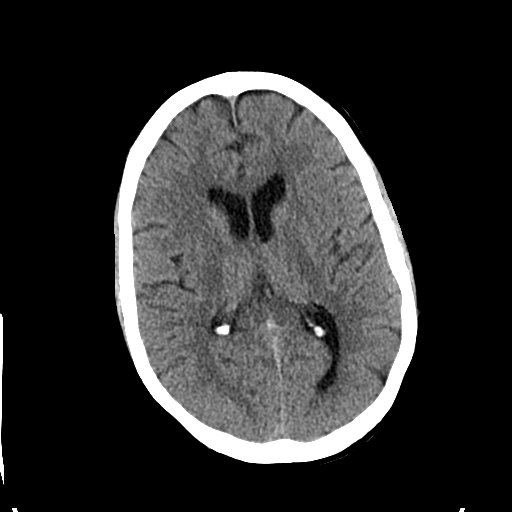
[im 22/35  brain]
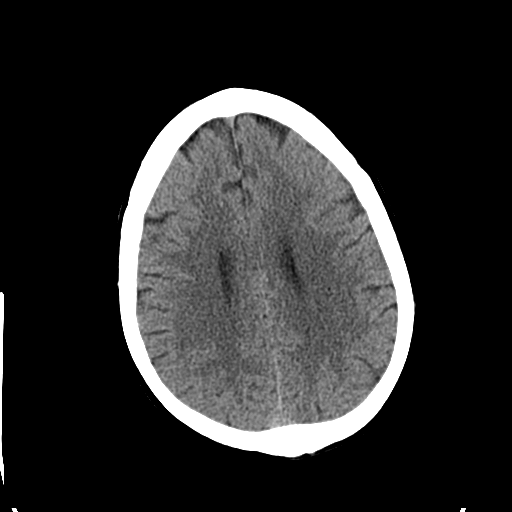
[im 22/35  bone]
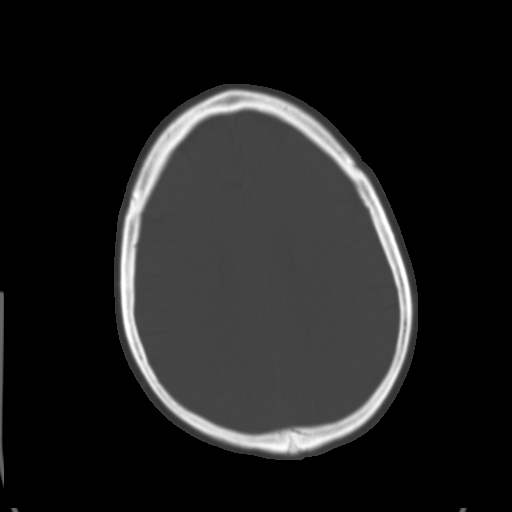
[im 26/35  brain]
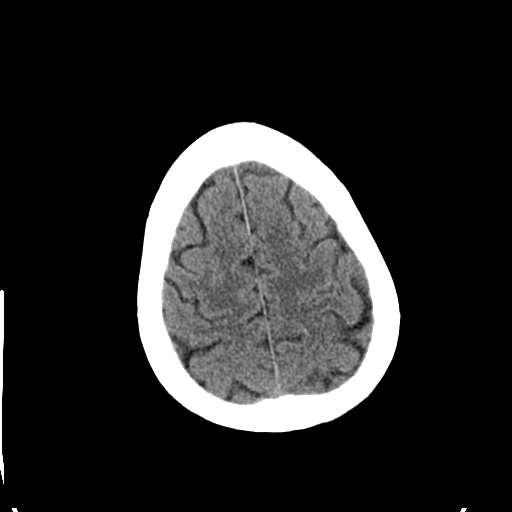
[im 30/35  brain]
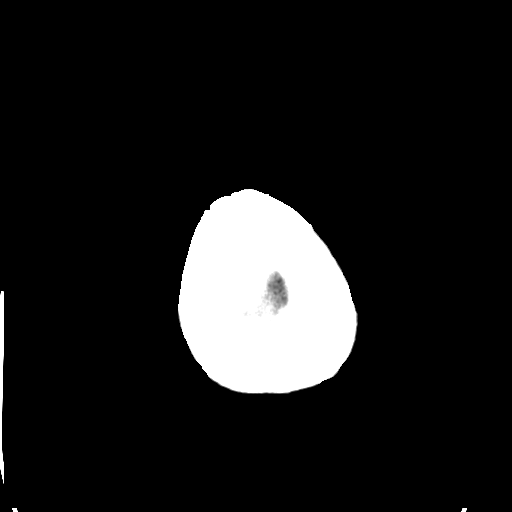

[Series 3: head bone · axial · 0.47mm/px · z∈[-112,-52]mm · 4 of 86 slices shown]
[im 9/86  bone]
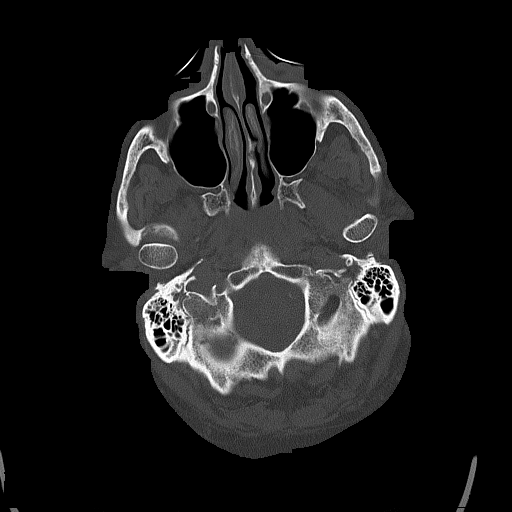
[im 18/86  bone]
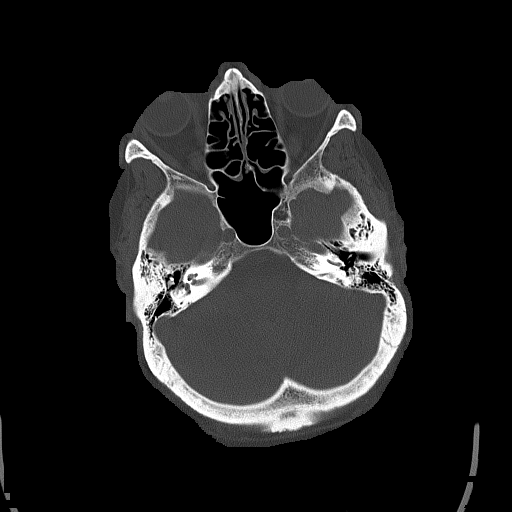
[im 26/86  bone]
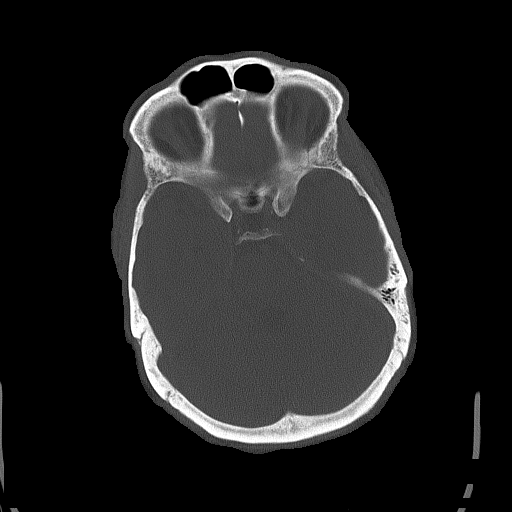
[im 39/86  bone]
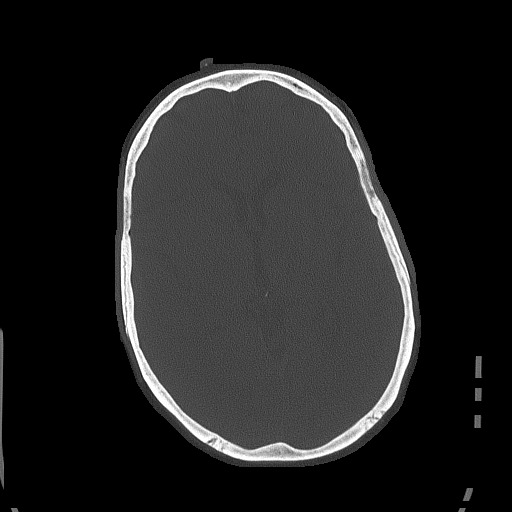

[Series 4: coronal soft tissue · coronal · 0.33mm/px · 3 of 75 slices shown]
[im 25/75  brain]
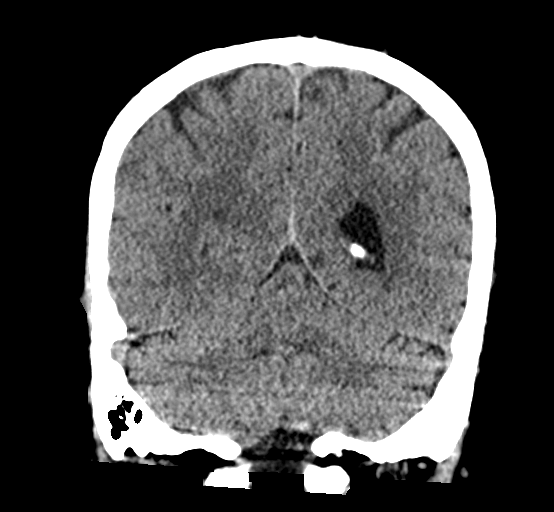
[im 33/75  brain]
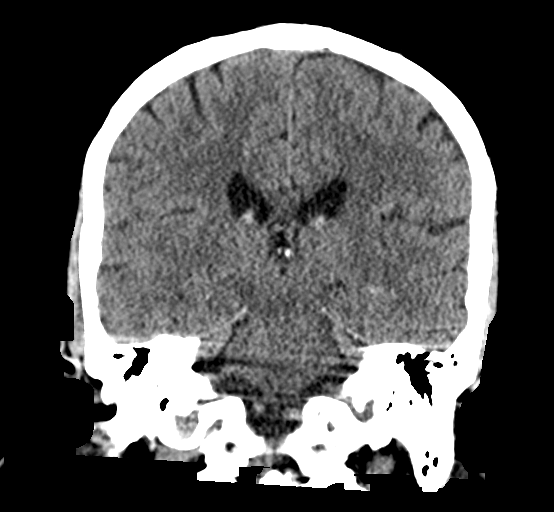
[im 42/75  brain]
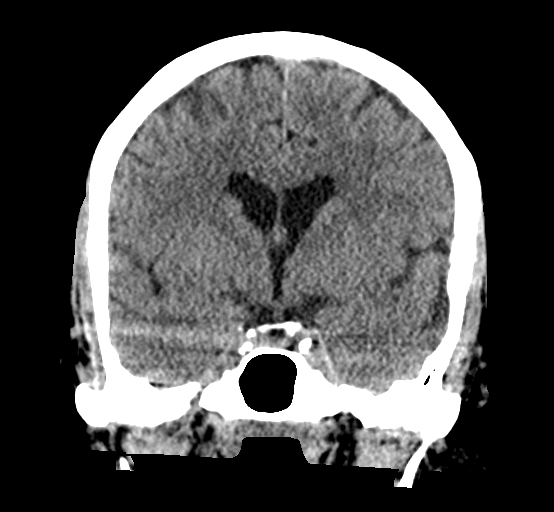

[Series 5: sagittal soft tissue · sagittal · 0.35mm/px · 3 of 61 slices shown]
[im 21/61  brain]
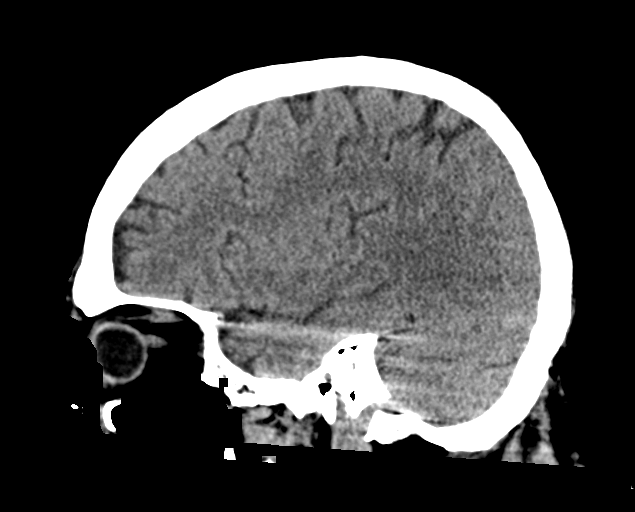
[im 31/61  brain]
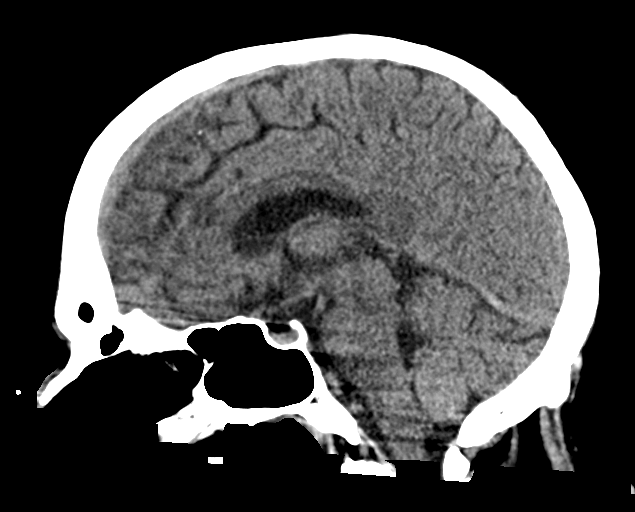
[im 41/61  brain]
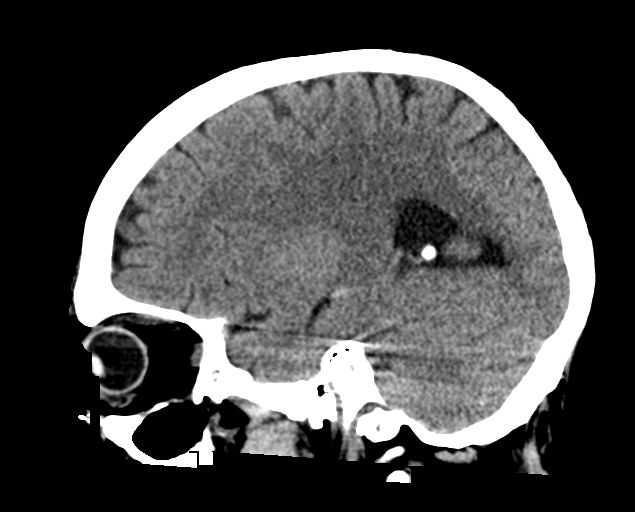

[17 of 47 positions shown; findings below may reference images not displayed]

FINDINGS: Brain: No evidence of acute infarction, hemorrhage, hydrocephalus,
extra-axial collection or mass lesion/mass effect. There is early
cerebral atrophy and small vessel disease, mild atrophic
ventriculomegaly without midline shift.

Vascular: There are scattered calcifications in the siphons and
distal vertebral arteries. There are no hyperdense central vessels.

Skull: Normal. Negative for fracture or focal lesion.

Sinuses/Orbits: No acute finding.

Other: No scalp hematoma is seen. There are scattered subcutaneous
calcifications in the frontal scalp chronically.
IMPRESSION: No acute intracranial CT findings, depressed skull fractures, or
interval changes.

## 2023-10-07 IMAGING — DX DG FOREARM 2V*L*
2 series · 2 of 2 positions shown · non-contrast
Comparison: None.

CLINICAL DATA: Soft tissue infection suspected. Left forearm
infection/abscess.

EXAM:
LEFT FOREARM - 2 VIEW

[forearm ap]
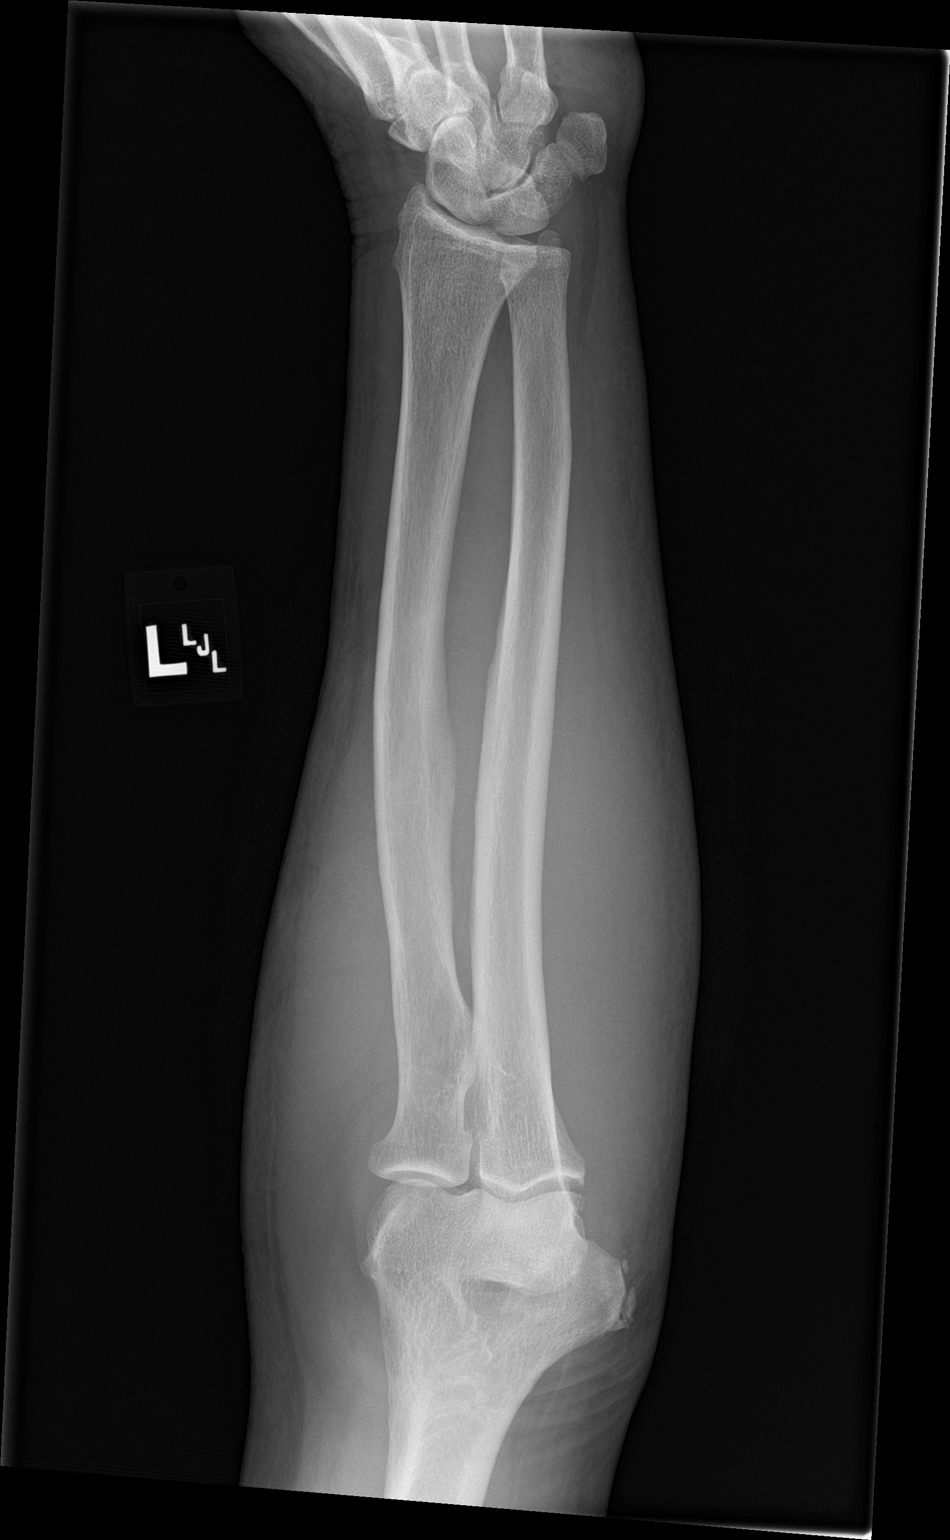

[forearm lat]
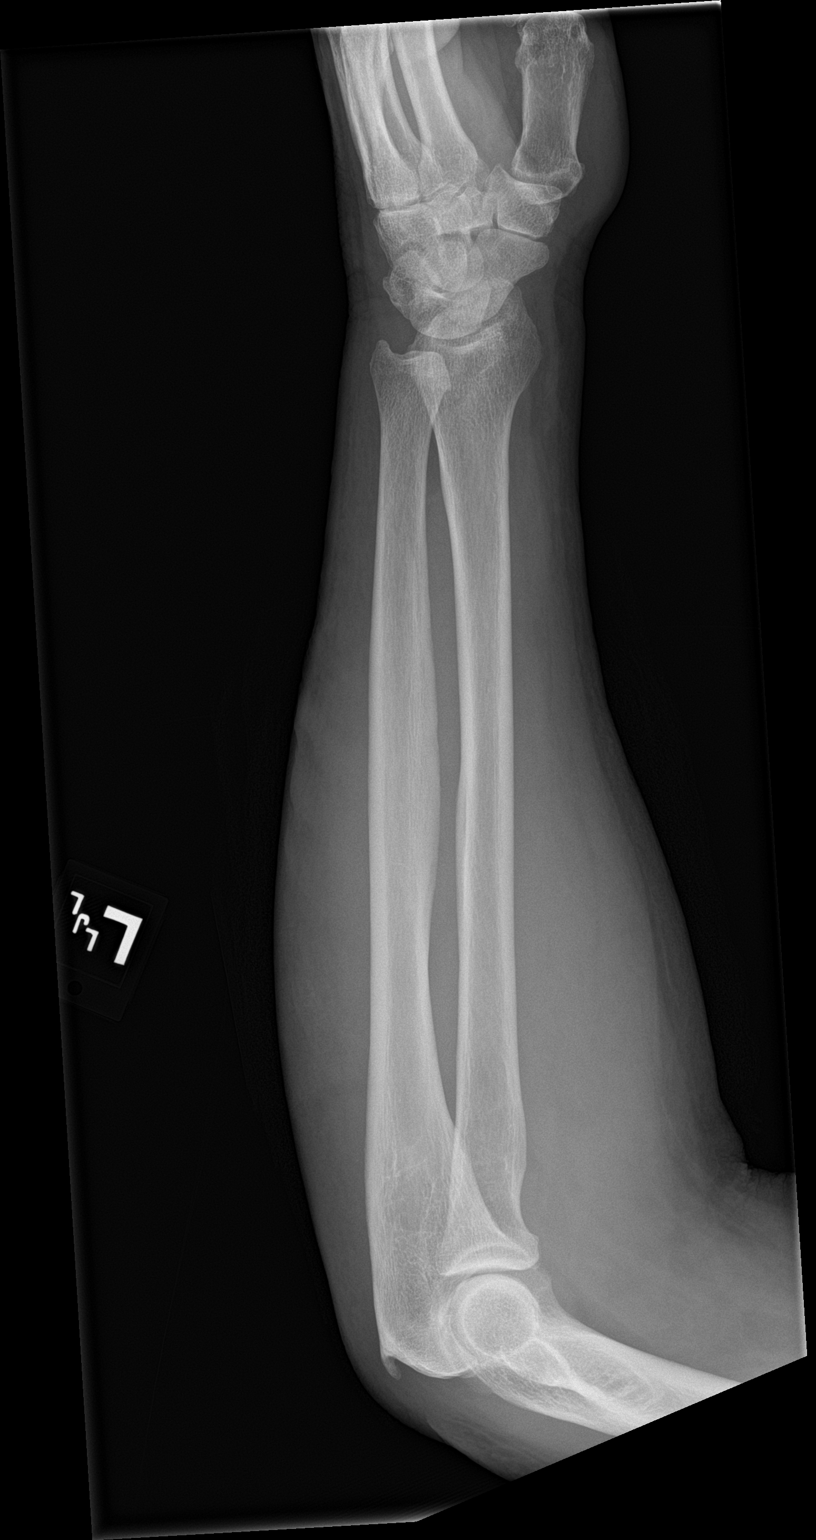

[2 of 2 positions shown; findings below may reference images not displayed]

FINDINGS: There is no evidence of fracture or other focal bone lesions.
Diffuse soft tissue swelling centered around the proximal forearm is
noted.
IMPRESSION: 1. No acute bone abnormality.
2. Soft tissue swelling centered around the proximal forearm.

## 2023-10-07 IMAGING — US US ABDOMEN LIMITED
1 series · 14 of 25 positions shown · non-contrast
Comparison: None.

CLINICAL DATA: Transaminitis.

EXAM:
ULTRASOUND ABDOMEN LIMITED RIGHT UPPER QUADRANT

[Series 1: us abdomen limited ruq (liver/gb) · 14 of 28 slices shown]
[im 1/28]
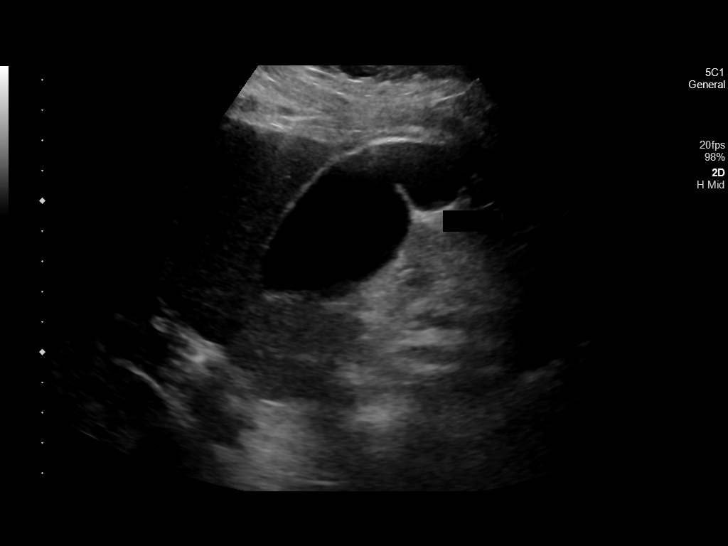
[im 3/28]
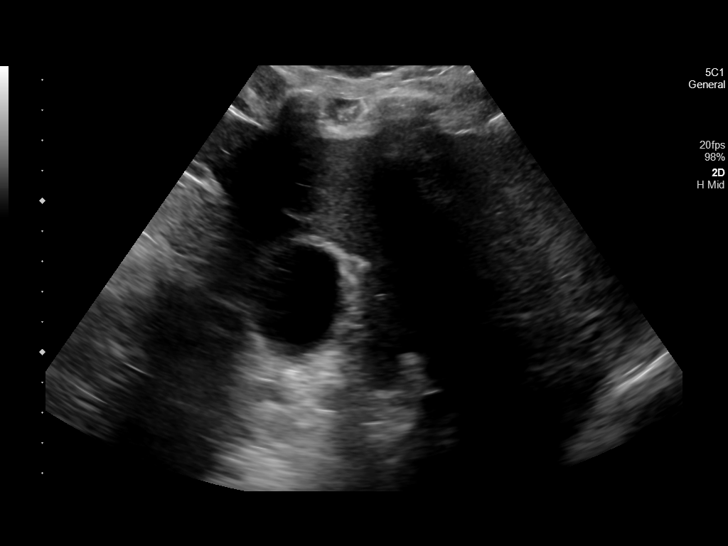
[im 5/28]
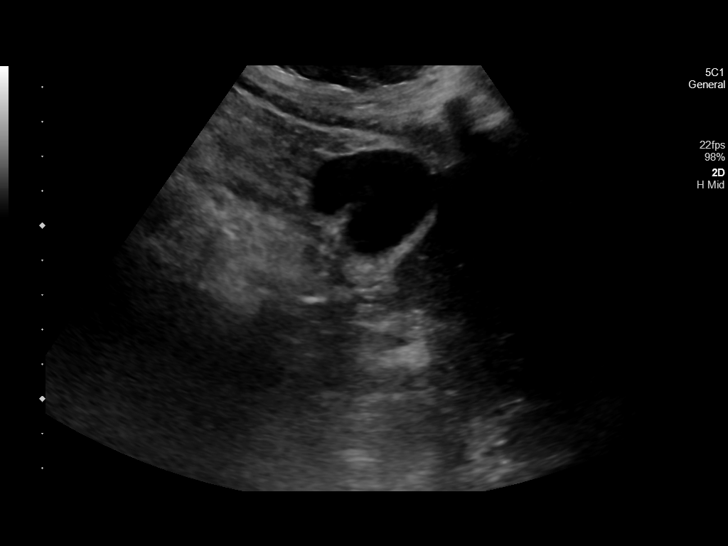
[im 7/28]
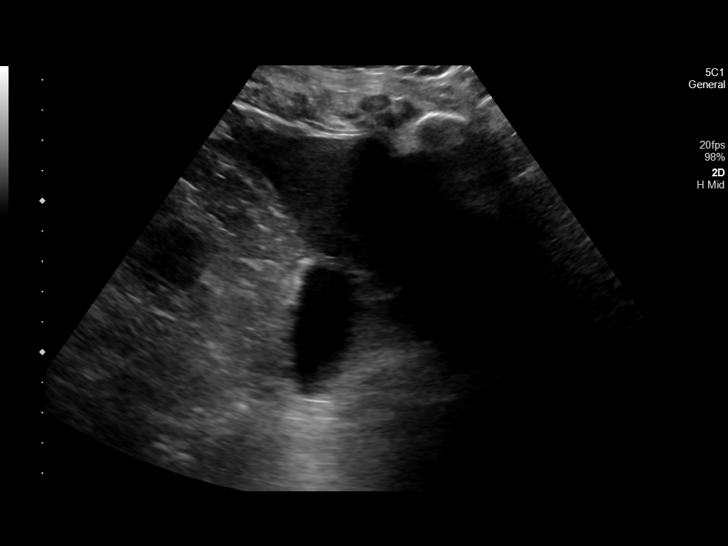
[im 10/28]
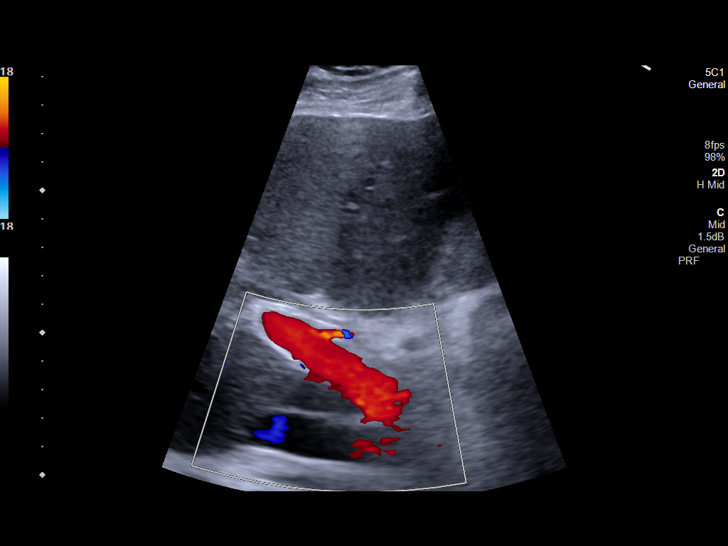
[im 11/28]
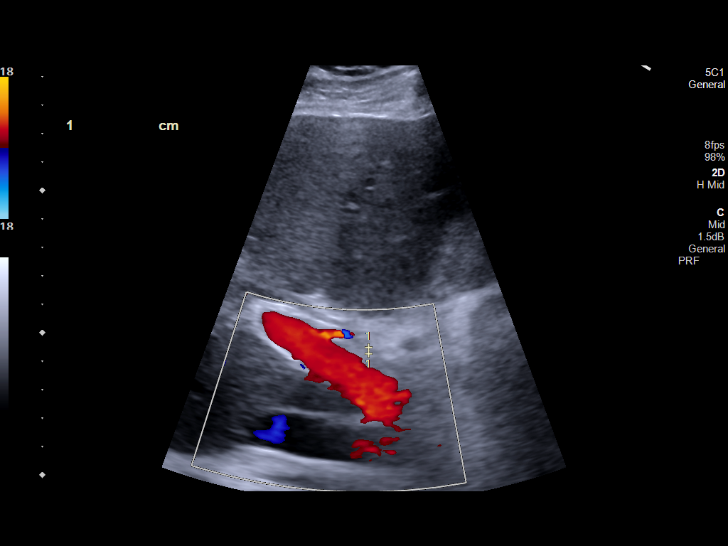
[im 13/28]
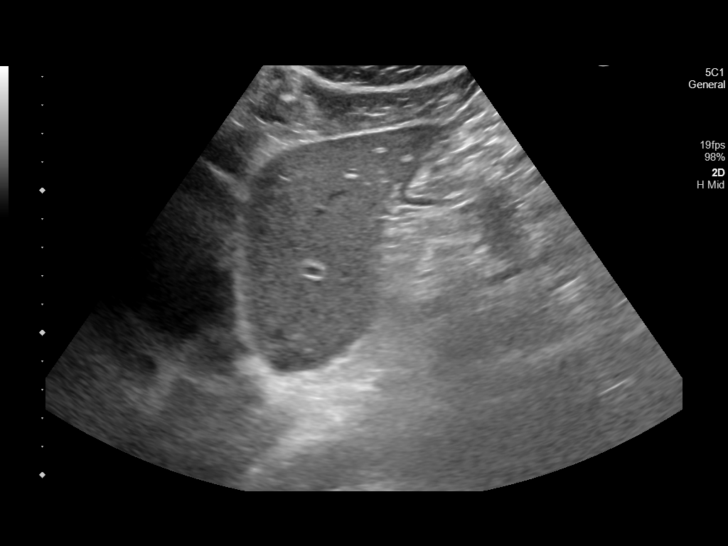
[im 15/28]
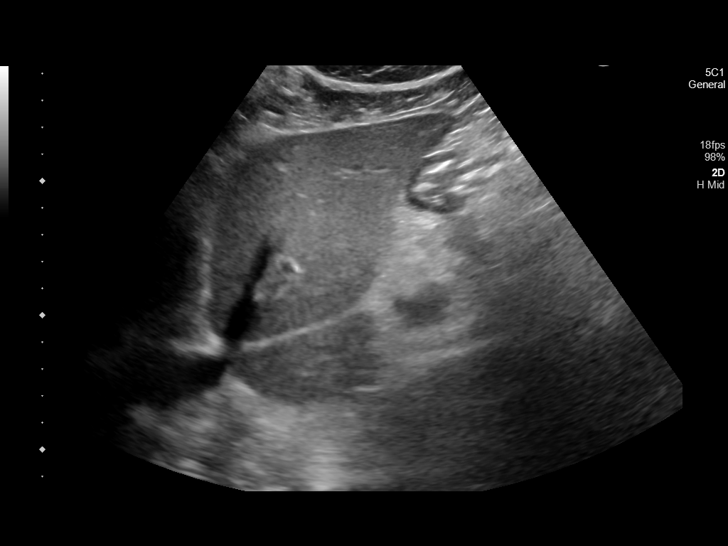
[im 17/28]
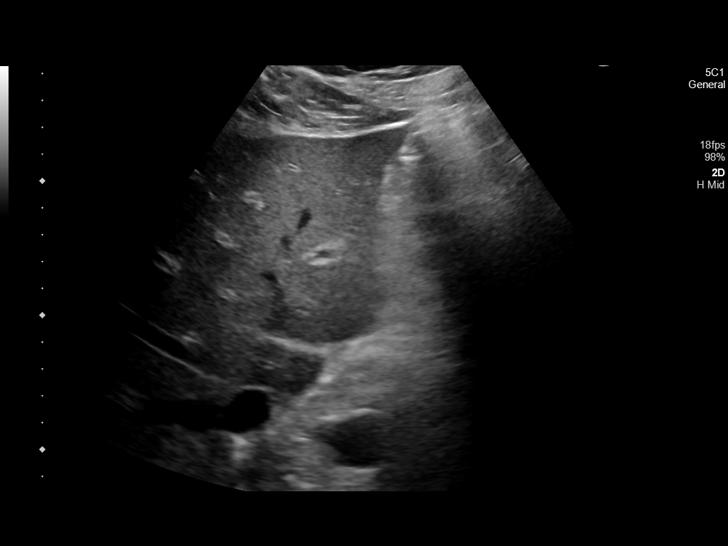
[im 19/28]
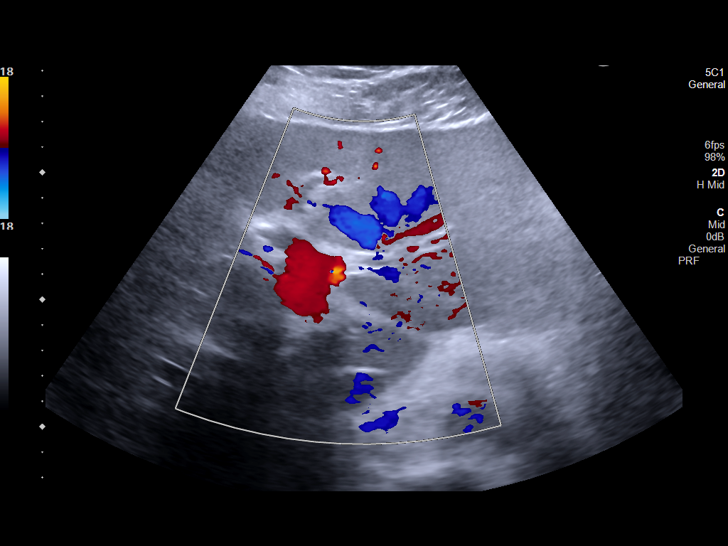
[im 21/28]
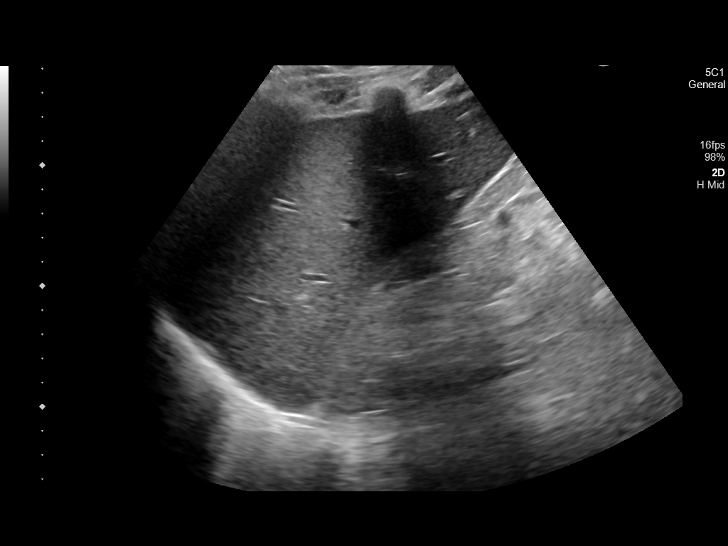
[im 23/28]
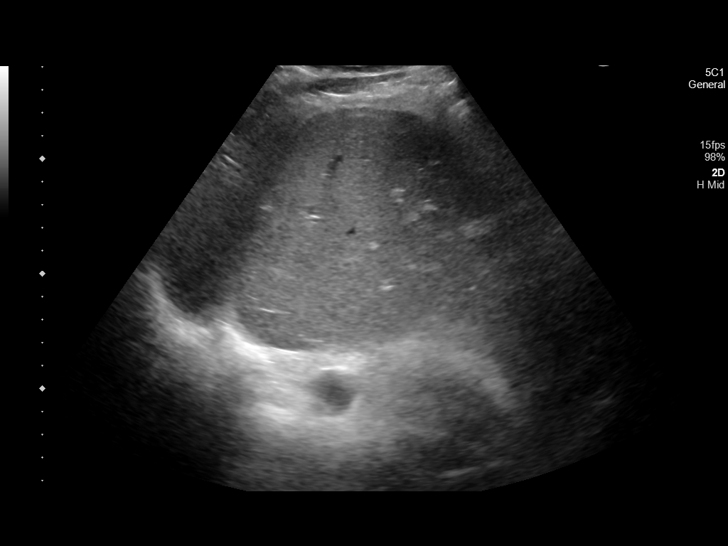
[im 25/28]
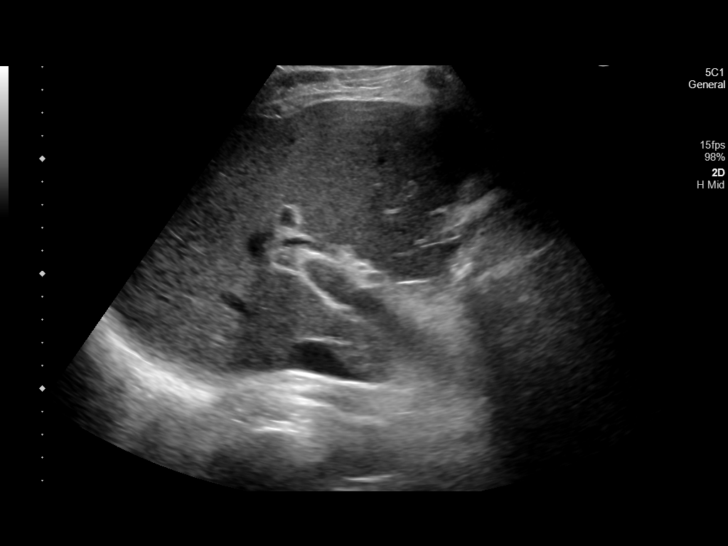
[im 28/28]
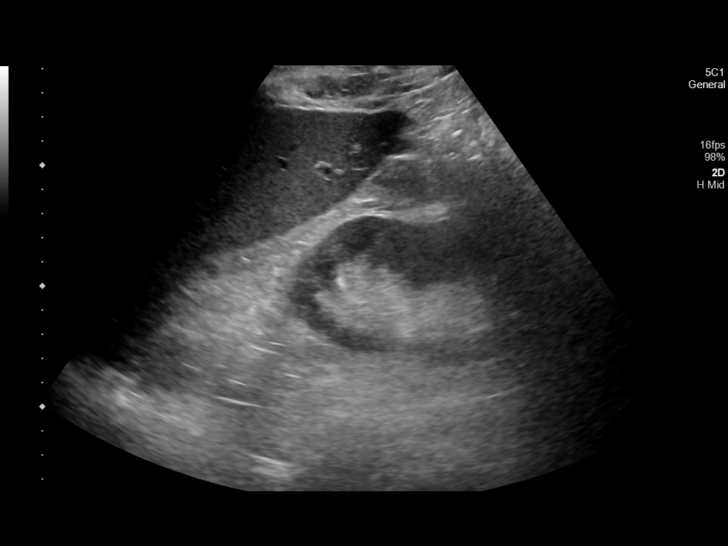

[14 of 25 positions shown; findings below may reference images not displayed]

FINDINGS: Gallbladder:

No gallstones or wall thickening visualized. No sonographic Murphy
sign noted by sonographer.

Common bile duct:

Diameter: 2 mm

Liver:

No focal lesion identified. Within normal limits in parenchymal
echogenicity. Portal vein is patent on color Doppler imaging with
normal direction of blood flow towards the liver.

Other: None.
IMPRESSION: Normal right upper quadrant ultrasound.

## 2023-10-09 ENCOUNTER — Ambulatory Visit (INDEPENDENT_AMBULATORY_CARE_PROVIDER_SITE_OTHER): Payer: Self-pay | Admitting: Urology

## 2023-10-09 VITALS — BP 126/76 | HR 73 | Ht 69.0 in | Wt 201.8 lb

## 2023-10-09 DIAGNOSIS — N529 Male erectile dysfunction, unspecified: Secondary | ICD-10-CM | POA: Diagnosis not present

## 2023-10-09 DIAGNOSIS — N401 Enlarged prostate with lower urinary tract symptoms: Secondary | ICD-10-CM

## 2023-10-09 DIAGNOSIS — N138 Other obstructive and reflux uropathy: Secondary | ICD-10-CM | POA: Diagnosis not present

## 2023-10-09 DIAGNOSIS — N200 Calculus of kidney: Secondary | ICD-10-CM | POA: Diagnosis not present

## 2023-10-09 MED ORDER — SILDENAFIL CITRATE 100 MG PO TABS
100.0000 mg | ORAL_TABLET | Freq: Every day | ORAL | 6 refills | Status: DC | PRN
Start: 2023-10-09 — End: 2024-04-20

## 2023-10-09 NOTE — Progress Notes (Signed)
   10/09/2023 1:12 PM   Ian Mills 05/15/1956 [id number]  Reason for visit: Follow up history of BPH, ED, PSA screening  HPI: Comorbid 68 year old male who had urinary retention ~2019 in [place] and underwent an outlet procedure with likely a TURP at that time.  Those records are not available to me.    He denies any urinary changes over the last year, specifically no gross hematuria, UTIs or change in stream.  PVR today normal at 0ml.   PSA November 2022 normal at 0.74.  Screening discontinued per the AUA guidelines that do not recommend routine screening in men over age 80.   Currently using Cialis 20 mg on demand for ED with only moderate results.  He would like to try sildenafil instead.  Prescription for sildenafil 100 mg on demand provided.  He reports he was told at [facility] health that he has a large kidney stone.  I am unable to personally review those images, however a CT spine from 04/28/2023 per report shows a 2.9 cm right renal stone with mild right hydronephrosis.  He denies any right-sided flank pain, he has chronic low back pain treated with injections.  He denies any right-sided flank pain, UTIs, or gross hematuria.  We discussed various treatment options for urolithiasis including observation with or without medical expulsive therapy, shockwave lithotripsy (SWL), ureteroscopy and laser lithotripsy with stent placement, and percutaneous nephrolithotomy.We discussed that management is based on stone size, location, density, patient co-morbidities, and patient preference.  SWL has a lower stone free rate in a single procedure, but also a lower complication rate compared to ureteroscopy and avoids a stent and associated stent related symptoms. Possible complications include renal hematoma, steinstrasse, and need for additional treatment.Ureteroscopy with laser lithotripsy and stent placement has a higher stone free rate than SWL in a single procedure, however increased  complication rate including possible infection, ureteral injury, bleeding, and stent related morbidity. Common stent related symptoms include dysuria, urgency/frequency, and flank pain.PCNL is the favored treatment for stones >2cm. It involves a small incision in the flank, with complete fragmentation of stones and removal. It has the highest stone free rate, but also the highest complication rate. Possible complications include bleeding, infection/sepsis, injury to surrounding organs including the pleura, and collecting system injury.   -Trial of sildenafil 100 mg on demand for ED to replace Cialis -CT stone protocol for better evaluation of reported large right renal stone seen on outside CT scan, follow-up to review results and consider surveillance, ureteroscopy, or PCNL    Sondra Come, MD  [facility] [address] [place], [place] [address] [phone]

## 2023-10-13 NOTE — Addendum Note (Signed)
 Addended by: Geralyn Flash D on: 10/13/2023 03:26 PM   Modules accepted: Orders

## 2023-10-13 NOTE — Progress Notes (Signed)
 Remote ICD transmission.

## 2023-10-16 DIAGNOSIS — M47816 Spondylosis without myelopathy or radiculopathy, lumbar region: Secondary | ICD-10-CM | POA: Diagnosis not present

## 2023-10-16 DIAGNOSIS — G8929 Other chronic pain: Secondary | ICD-10-CM | POA: Diagnosis not present

## 2023-10-16 DIAGNOSIS — M5136 Other intervertebral disc degeneration, lumbar region with discogenic back pain only: Secondary | ICD-10-CM | POA: Diagnosis not present

## 2023-10-22 ENCOUNTER — Ambulatory Visit
Admission: RE | Admit: 2023-10-22 | Discharge: 2023-10-22 | Disposition: A | Discharge status: Home / Self Care | Source: Ambulatory Visit | Attending: Urology | Admitting: Urology

## 2023-10-22 DIAGNOSIS — N200 Calculus of kidney: Secondary | ICD-10-CM | POA: Diagnosis not present

## 2023-10-22 DIAGNOSIS — N281 Cyst of kidney, acquired: Secondary | ICD-10-CM | POA: Diagnosis not present

## 2023-10-27 ENCOUNTER — Ambulatory Visit (INDEPENDENT_AMBULATORY_CARE_PROVIDER_SITE_OTHER): Admitting: Urology

## 2023-10-27 ENCOUNTER — Encounter: Payer: Self-pay | Admitting: Urology

## 2023-10-27 VITALS — BP 123/79 | HR 70 | Ht 69.0 in | Wt 201.0 lb

## 2023-10-27 DIAGNOSIS — N200 Calculus of kidney: Secondary | ICD-10-CM

## 2023-10-27 DIAGNOSIS — I10 Essential (primary) hypertension: Secondary | ICD-10-CM

## 2023-10-27 DIAGNOSIS — R1011 Right upper quadrant pain: Secondary | ICD-10-CM

## 2023-10-27 MED ORDER — POTASSIUM CITRATE ER 15 MEQ (1620 MG) PO TBCR: 2.0000 | EXTENDED_RELEASE_TABLET | Freq: Three times a day (TID) | ORAL | 3 refills | Status: DC

## 2023-10-27 NOTE — Progress Notes (Signed)
   10/27/2023 3:57 PM   Ian Mills July 21, 1956 [id number]  Reason for visit: Follow up right renal stone, history of BPH, ED  HPI: 68 year old male with history of urinary retention in 2019 treated with a TURP in , has had no urinary problems since that time.  PSA levels have been <1, and screening discontinued at age 31 per the guideline recommendations.  Recently changed to sildenafil 100 mg to replace Cialis.  At our visit in March 2025 he reportedly had a large right renal stone seen on outside CT scan but those records are unavailable.  A CT was ordered for further evaluation.  I personally viewed and interpreted the CT scan dated 10/22/2023 showing a 3.5 cm right renal pelvis stone, 1 cm lower pole stone, no hydronephrosis, 400HU, most consistent with a large uric acid stone.  We discussed various treatment options for urolithiasis including observation with or without medical expulsive therapy, shockwave lithotripsy (SWL), ureteroscopy and laser lithotripsy with stent placement, and percutaneous nephrolithotomy.We discussed that management is based on stone size, location, density, patient co-morbidities, and patient preference. Stones <47mm in size have a >80% spontaneous passage rate. Data surrounding the use of tamsulosin for medical expulsive therapy is controversial, but meta analyses suggests it is most efficacious for distal stones between 5-67mm in size. Possible side effects include dizziness/lightheadedness, and retrograde ejaculation.SWL has a lower stone free rate in a single procedure, but also a lower complication rate compared to ureteroscopy and avoids a stent and associated stent related symptoms. Possible complications include renal hematoma, steinstrasse, and need for additional treatment.Ureteroscopy with laser lithotripsy and stent placement has a higher stone free rate than SWL in a single procedure, however increased complication rate including possible infection,  ureteral injury, bleeding, and stent related morbidity. Common stent related symptoms include dysuria, urgency/frequency, and flank pain.PCNL is the favored treatment for stones >2cm. It involves a small incision in the flank, with complete fragmentation of stones and removal. It has the highest stone free rate, but also the highest complication rate. Possible complications include bleeding, infection/sepsis, injury to surrounding organs including the pleura, and collecting system injury.   Using shared decision making he opted for trial of alkalinization therapy with potassium citrate 30 mEq 3 times daily, increasing fluid intake, and minimizing animal proteins.  He can stop the potassium supplement through his cardiologist, and we will recheck a potassium level at 1 week in 1 month.  We discussed that even if we are not able to completely dissolve the stone, we potentially shrink the stone enough to be able to perform ureteroscopy instead of PCNL with a much lower side effect profile and quicker recovery.  RTC 3 months repeat CT scan after trial of alkalinization/dissolution therapy     Sondra Come, MD  [facility] [address], [address] [place], [place] [address] [phone]

## 2023-10-27 NOTE — Patient Instructions (Addendum)
 Take 2 tablets of potassium citrate 3 times daily.  Make sure you take this at least twice daily.  Drink lots of fluids to keep the urine clear and help the stone dissolve.  Decrease the amount of animal protein in your diet.  You can stop the potassium chloride pill from your cardiologist  We will recheck blood work at 1 week in 1 month to confirm potassium levels are normal, and repeat the CT scan in 2 to 3 months to assess how much has dissolved.

## 2023-10-31 ENCOUNTER — Other Ambulatory Visit (INDEPENDENT_AMBULATORY_CARE_PROVIDER_SITE_OTHER): Payer: Self-pay | Admitting: Nurse Practitioner

## 2023-10-31 DIAGNOSIS — I771 Stricture of artery: Secondary | ICD-10-CM

## 2023-10-31 DIAGNOSIS — I6523 Occlusion and stenosis of bilateral carotid arteries: Secondary | ICD-10-CM

## 2023-11-03 ENCOUNTER — Other Ambulatory Visit

## 2023-11-04 ENCOUNTER — Ambulatory Visit (INDEPENDENT_AMBULATORY_CARE_PROVIDER_SITE_OTHER): Payer: Medicare Other

## 2023-11-04 ENCOUNTER — Other Ambulatory Visit

## 2023-11-04 ENCOUNTER — Telehealth: Payer: Self-pay | Admitting: Urology

## 2023-11-04 ENCOUNTER — Ambulatory Visit (INDEPENDENT_AMBULATORY_CARE_PROVIDER_SITE_OTHER): Payer: Medicare Other | Admitting: Vascular Surgery

## 2023-11-04 ENCOUNTER — Encounter (INDEPENDENT_AMBULATORY_CARE_PROVIDER_SITE_OTHER): Payer: Self-pay | Admitting: Vascular Surgery

## 2023-11-04 VITALS — BP 122/74 | HR 58 | Resp 18 | Ht 69.0 in | Wt 196.8 lb

## 2023-11-04 DIAGNOSIS — I10 Essential (primary) hypertension: Secondary | ICD-10-CM

## 2023-11-04 DIAGNOSIS — R1011 Right upper quadrant pain: Secondary | ICD-10-CM

## 2023-11-04 DIAGNOSIS — I771 Stricture of artery: Secondary | ICD-10-CM | POA: Diagnosis not present

## 2023-11-04 DIAGNOSIS — E782 Mixed hyperlipidemia: Secondary | ICD-10-CM

## 2023-11-04 DIAGNOSIS — I6523 Occlusion and stenosis of bilateral carotid arteries: Secondary | ICD-10-CM

## 2023-11-04 DIAGNOSIS — N1831 Chronic kidney disease, stage 3a: Secondary | ICD-10-CM | POA: Diagnosis not present

## 2023-11-04 NOTE — Assessment & Plan Note (Signed)
 Carotid duplex shows stable 1 to 39% ICA stenosis bilaterally without significant progression from previous studies.  No role for intervention.  Continue medical therapy.  Follow-up in 1 year with carotid duplex.

## 2023-11-04 NOTE — Telephone Encounter (Signed)
 Patient was in office this morning for lab appointment and he stated that the Potassium Citrate he is taking is making him nauseous. He is asking if there is something that can be prescribed for the nausea. Please advise patient.

## 2023-11-04 NOTE — Progress Notes (Signed)
 MRN : [id number]  Ian Mills is a 68 y.o. ([birth year]) male who presents with chief complaint of  Chief Complaint  Patient presents with   Follow-up    6 month carotid follow up  .  History of Present Illness: Patient returns in follow-up of his carotid disease.  He is doing well.  He denies any focal neurologic symptoms. Specifically, the patient denies amaurosis fugax, speech or swallowing difficulties, or arm or leg weakness or numbness.  He remains on dual antiplatelet therapy and a statin agent.  Carotid duplex today reveals stable 1 to 39% ICA stenosis bilaterally.  Current Outpatient Medications  Medication Sig Dispense Refill   ALPRAZolam (XANAX) 0.5 MG tablet Take 1 tablet (0.5 mg total) by mouth 3 (three) times daily as needed. for anxiety 90 tablet 5   aspirin EC 81 MG tablet Take 81 mg by mouth daily. Swallow whole.     atorvastatin (LIPITOR) 80 MG tablet Take 1 tablet (80 mg total) by mouth daily. 90 tablet 3   carvedilol (COREG) 3.125 MG tablet Take 1 tablet (3.125 mg total) by mouth 2 (two) times daily. 180 tablet 3   clopidogrel (PLAVIX) 75 MG tablet Take 1 tablet (75 mg total) by mouth daily. 90 tablet 3   dapagliflozin propanediol (FARXIGA) 10 MG TABS tablet Take 1 tablet (10 mg total) by mouth daily before breakfast. 90 tablet 3   ezetimibe (ZETIA) 10 MG tablet Take 1 tablet (10 mg total) by mouth daily. 90 tablet 3   Potassium Citrate 15 MEQ (1620 MG) TBCR Take 2 tablets by mouth in the morning, at noon, and at bedtime. 180 tablet 3   sacubitril-valsartan (ENTRESTO) 49-51 MG Take 1 tablet by mouth 2 (two) times daily. 180 tablet 3   sildenafil (VIAGRA) 100 MG tablet Take 1 tablet (100 mg total) by mouth daily as needed for erectile dysfunction (take 30 minute prior to sexual activity). 30 tablet 6   spironolactone (ALDACTONE) 25 MG tablet Take 1 tablet (25 mg total) by mouth daily. 90 tablet 3   tadalafil (CIALIS) 20 MG tablet Take 0.5-1 tablets (10-20 mg total) by  mouth daily as needed for erectile dysfunction (take 1 hour prior to sexual activity). 30 tablet 11   Tiotropium Bromide-Olodaterol (STIOLTO RESPIMAT) 2.5-2.5 MCG/ACT AERS Inhale 2 puffs into the lungs daily. 4 g 11   torsemide (DEMADEX) 20 MG tablet Take 1 tablet (20 mg total) by mouth as needed. 180 tablet 3   No current facility-administered medications for this visit.    Past Medical History:  Diagnosis Date   Acute anterior wall MI (HCC) 07/04/1991   a.) LHC: EF 29%; 100% mLAD -> PTCA performed   Acute cough 08/16/2022   AICD (automatic cardioverter/defibrillator) present    Anxiety    Aortic atherosclerosis (HCC)    Baker's cyst 06/10/2019   5.3 cm 01/2018 noted [place] no repair as of 06/10/2019      Bilateral impacted cerumen 06/10/2019   Cellulitis of left foot 05/10/2022   CHF (congestive heart failure) (HCC)    COPD (chronic obstructive pulmonary disease) (HCC)    Coronary artery disease    a.) 3 stents --> 3x18 mm Duet to OM3, 2.5x18 mm Duet to D1, 2.5x13 mm Duet to RPDA. b. 06/2013 PCI: CTO LAD, patent LAD stent, RCA 70p (FFR 0.74--> DES), 51m (DES), RPDA 50%, EF 20%; c. 08/2019 Cath: LM nl, LAD 100p/m, D1 60, D2 patent stent, LCX 60ost/p, 29m ISR, RCA 30p, 93m ISR.  Dizziness 03/30/2015   Family history of lung cancer    HFrEF (heart failure with reduced ejection fraction) (HCC)    a. 05/2014 Echo: EF 25-30%; b. 01/2018 Echo ([facility]): EF 30%; c. 06/2021 Echo: EF 25-30%, apical AK, GrI DD, nl RV fxn.   History of prostate surgery 06/09/2020   History of ventricular tachycardia 01/23/2012   HLD (hyperlipidemia)    HTN (hypertension)    ICD  single,BSX    a. DOI 11/2011; b. S/N# [id number]   Insomnia 06/01/2014   Ischemic cardiomyopathy    a. 05/2014 Echo: EF 25-30%; b. 01/2018 Echo ([facility]): EF 30%, c. 06/2021 Echo: EF 25-30%.   Lateral wall myocardial infarction (HCC) 06/29/1997   a.) LHC: EF 44%; 75% RPDA and 95% OM3; PCI performed and a 3.0 x 18 mm Duet stent placed to OM3    Leukocytosis 08/16/2022   Long term current use of antithrombotics/antiplatelets    a.) DAPT therapy (ASA + clopidogrel)   Low left ventricular ejection fraction    NSTEMI (non-ST elevated myocardial infarction) (HCC) 11/27/2011   a.) LHC: 100% mLAD; intervention deferred opting for medical management.   NSTEMI (non-ST elevated myocardial infarction) (HCC) 06/23/2016   Personal history of colonic polyps    Pneumonia 08/2018   Sacroiliac pain 07/31/2016   Formatting of this note might be different from the original. Added automatically from request for surgery [id number]   Sepsis (HCC) 07/29/2021   Sepsis due to group A Streptococcus with acute renal failure and septic shock (HCC)    Syncope and collapse    Syncope and collapse 09/26/2010   Qualifier: Diagnosis of   By: Mariah Milling MD, Tim       Tobacco abuse 06/13/2016   URI (upper respiratory infection) 03/17/2022   Ventricular tachycardia (HCC) 12/01/2011   a.) VT arrest; ROSC achieved --> [employer] AICD placed.    Past Surgical History:  Procedure Laterality Date   APPENDECTOMY N/A    CARDIAC DEFIBRILLATOR PLACEMENT N/A 12/01/2011   CATARACT EXTRACTION W/PHACO Left 08/06/2023   Procedure: CATARACT EXTRACTION PHACO AND INTRAOCULAR LENS PLACEMENT (IOC) LEFT 4.61, 00:28.3;  Surgeon: Lockie Mola, MD;  Location[facility];  Service: Ophthalmology;  Laterality: Left;   COLONOSCOPY WITH PROPOFOL N/A 12/22/2020   Procedure: COLONOSCOPY WITH PROPOFOL;  Surgeon: Wyline Mood, MD;  Location: [facility];  Service: Gastroenterology;  Laterality: N/A;   CORONARY ANGIOPLASTY Left 07/05/1991   Procedure: CORONARY ANGIOPLASTY; Location: [facility]; Surgeon: Eugenia Pancoast, MD   CORONARY ANGIOPLASTY Left 08/06/1991   Procedure: CORONARY ANGIOPLASTY; Location: [facility]; Surgeon: Ned Clines, MD   CORONARY ANGIOPLASTY Left 08/09/1991   Procedure: CORONARY ANGIOPLASTY; Location: [facility]; Surgeon: Ned Clines, MD   CORONARY ANGIOPLASTY Left  10/28/1991   Procedure: CORONARY ANGIOPLASTY; Location: [facility]; Surgeon: Ned Clines, MD   CORONARY ANGIOPLASTY Left 11/02/1991   Procedure: CORONARY ANGIOPLASTY; Location: [facility]; Surgeon: Ned Clines, MD   CORONARY ANGIOPLASTY Left 01/10/1992   Procedure: CORONARY ANGIOPLASTY; Location: [facility]; Surgeon: Philomena Course, MD   CORONARY ANGIOPLASTY Left 09/26/1993   Procedure: CORONARY ANGIOPLASTY; Location: [facility]; Surgeon: Theone Murdoch, MD   CORONARY ANGIOPLASTY Left 09/11/2006   Procedure: CORONARY ANGIOPLASTY; Location: [facility]   CORONARY ANGIOPLASTY Left 01/17/2010   Procedure: CORONARY ANGIOPLASTY; Location: [facility]; Surgeon: Freddi Che, MD   CORONARY ANGIOPLASTY Left 11/27/2011   Procedure: CORONARY ANGIOPLASTY; Location: [facility]; Surgeon: Nolon Rod, MD   CORONARY ANGIOPLASTY WITH STENT PLACEMENT Left 06/29/1997   Procedure: CORONARY ANGIOPLASTY WITH STENT PLACEMENT (3.0 x 18 mm Diet stent to OM3); Location: [facility]; Surgeon:  Samuella Cota, MD   CORONARY ANGIOPLASTY WITH STENT PLACEMENT Left 07/12/1998   Procedure: CORONARY ANGIOPLASTY WITH STENT PLACEMENT (2.8 x 8 mm Duet stent to D1, 2.5 x 13 mm Duet stent to RPDA); Location: [facility]   GRAFT APPLICATION Left 08/24/2021   Procedure: GRAFT APPLICATION;  Surgeon: Sung Amabile, DO;  Location: [facility];  Service: General;  Laterality: Left;   LEFT HEART CATH AND CORONARY ANGIOGRAPHY Left 08/30/2019   Procedure: LEFT HEART CATH AND CORONARY ANGIOGRAPHY;  Surgeon: Iran Ouch, MD;  Location: [facility] INVASIVE CV LAB;  Service: Cardiovascular;  Laterality: Left;   PROSTATE SURGERY     urethra grew around prostate    TUMOR REMOVAL  2001   chest thymic cyst; behind lungs [facility] benign      Social History   Tobacco Use   Smoking status: Some Days    Current packs/day: 0.00    Average packs/day: 0.5 packs/day for 42.0 years (21.0 ttl pk-yrs)    Types: Cigarettes    Start date: 11/09/1977    Last attempt to quit: 11/10/2019    Years since quitting: 3.9    Smokeless tobacco: Never  Vaping Use   Vaping status: Never Used  Substance Use Topics   Alcohol use: Yes    Alcohol/week: 1.0 standard drink of alcohol    Types: 1 Cans of beer per week    Comment: Rare   Drug use: No       Family History  Problem Relation Age of Onset   Cancer - Lung Mother    Hypertension Mother    Heart attack Mother    Cancer Father        larynx   Dementia Maternal Grandmother    Heart attack Paternal Grandfather    Colon cancer Neg Hx    Stomach cancer Neg Hx    Pancreatic cancer Neg Hx      Allergies  Allergen Reactions   Nitroglycerin Nausea Only and Other (See Comments)    Per patient "causes severe  headache"      REVIEW OF SYSTEMS (Negative unless checked)   Constitutional: [] Weight loss  [] Fever  [] Chills Cardiac: [x] Chest pain   [] Chest pressure   [x] Palpitations   [] Shortness of breath when laying flat   [] Shortness of breath at rest   [x] Shortness of breath with exertion. Vascular:  [] Pain in legs with walking   [] Pain in legs at rest   [] Pain in legs when laying flat   [] Claudication   [] Pain in feet when walking  [] Pain in feet at rest  [] Pain in feet when laying flat   [] History of DVT   [] Phlebitis   [] Swelling in legs   [] Varicose veins   [] Non-healing ulcers Pulmonary:   [] Uses home oxygen   [] Productive cough   [] Hemoptysis   [] Wheeze  [] COPD   [] Asthma Neurologic:  [] Dizziness  [] Blackouts   [] Seizures   [] History of stroke   [] History of TIA  [] Aphasia   [] Temporary blindness   [] Dysphagia   [] Weakness or numbness in arms   [] Weakness or numbness in legs Musculoskeletal:  [x] Arthritis   [] Joint swelling   [] Joint pain   [] Low back pain Hematologic:  [] Easy bruising  [] Easy bleeding   [] Hypercoagulable state   [] Anemic  [] Hepatitis Gastrointestinal:  [] Blood in stool   [] Vomiting blood  [] Gastroesophageal reflux/heartburn   [] Abdominal pain Genitourinary:  [] Chronic kidney disease   [] Difficult urination  [] Frequent urination   [] Burning with urination   [] Hematuria Skin:  [] Rashes   []   Ulcers   [] Wounds Psychological:  [] History of anxiety   []  History of major depression.  Physical Examination  Vitals:   11/04/23 1314  BP: 122/74  Pulse: (!) 58  Resp: 18  Weight: 196 lb 12.8 oz (89.3 kg)  Height: 5\' 9"  (1.753 m)   Body mass index is 29.06 kg/m. Gen:  WD/WN, NAD Head: Apex/AT, No temporalis wasting. Ear/Nose/Throat: Hearing grossly intact, nares w/o erythema or drainage, trachea midline Eyes: Conjunctiva clear. Sclera non-icteric Neck: Supple.  No bruit  Pulmonary:  Good air movement, equal and clear to auscultation bilaterally.  Cardiac: RRR, No JVD Vascular:  Vessel Right Left  Radial Palpable Palpable           Musculoskeletal: M/S 5/5 throughout.  No deformity or atrophy. Trace LE edema. Neurologic: CN 2-12 intact. Sensation grossly intact in extremities.  Symmetrical.  Speech is fluent. Motor exam as listed above. Psychiatric: Judgment intact, Mood & affect appropriate for pt's clinical situation. Dermatologic: No rashes or ulcers noted.  No cellulitis or open wounds. Lymph : No Cervical, Axillary, or Inguinal lymphadenopathy.    CBC Lab Results  Component Value Date   WBC 8.2 06/30/2023   HGB 16.0 06/30/2023   HCT 48.7 06/30/2023   MCV 91 06/30/2023   PLT 185 06/30/2023    BMET    Component Value Date/Time   NA 140 06/30/2023 1430   NA 137 05/30/2014 0455   K 4.8 06/30/2023 1430   K 3.9 05/30/2014 0455   CL 103 06/30/2023 1430   CL 106 05/30/2014 0455   CO2 20 06/30/2023 1430   CO2 26 05/30/2014 0455   GLUCOSE 77 06/30/2023 1430   GLUCOSE 108 (H) 12/11/2022 1128   GLUCOSE 104 (H) 05/30/2014 0455   BUN 23 06/30/2023 1430   BUN 12 05/30/2014 0455   CREATININE 1.09 06/30/2023 1430   CREATININE 1.33 (H) 06/09/2020 1447   CREATININE 1.33 (H) 06/09/2020 1447   CALCIUM 9.4 06/30/2023 1430   CALCIUM 8.1 (L) 05/30/2014 0455   GFRNONAA >60 12/11/2022 1128   GFRNONAA 55 (L)  06/09/2020 1447   GFRAA 64 06/09/2020 1447   CrCl cannot be calculated (Patient's most recent lab result is older than the maximum 21 days allowed.).  COAG Lab Results  Component Value Date   INR 0.97 03/06/2015   INR 1.0 06/28/2013    Radiology CT RENAL STONE STUDY Result Date: 10/27/2023 CLINICAL DATA:  Back pain, nephrolithiasis EXAM: CT ABDOMEN AND PELVIS WITHOUT CONTRAST TECHNIQUE: Multidetector CT imaging of the abdomen and pelvis was performed following the standard protocol without IV contrast. RADIATION DOSE REDUCTION: This exam was performed according to the departmental dose-optimization program which includes automated exposure control, adjustment of the mA and/or kV according to patient size and/or use of iterative reconstruction technique. COMPARISON:  CT abdomen dated 07/17/2005 FINDINGS: Lower chest: Lung bases are essentially clear. Hepatobiliary: Unenhanced liver is unremarkable. Gallbladder is unremarkable. No intrahepatic or extrahepatic ductal dilatation. Pancreas: Within normal limits. Spleen: Within normal limits. Adrenals/Urinary Tract: Adrenal glands are within normal limits. Mild left renal cortical scarring/atrophy. Simple right renal cysts measuring up to 4.0 cm in the anterior right lower pole (series 2/image 42), benign (Bosniak I). No follow-up is recommended. 1.6 x 3.4 cm calculus in the right renal pelvis with associated urothelial thickening/stranding (series 2/image 38). Two additional nonobstructing right renal calculi measuring up to 10 mm in the lower pole (series 2/image 42). No hydronephrosis. Bladder is mildly thick-walled although underdistended. Stomach/Bowel: Stomach is within normal limits.  No evidence of bowel obstruction. Prior appendectomy no colonic wall thickening or inflammatory changes. Vascular/Lymphatic: No evidence of abdominal aortic aneurysm. Atherosclerotic calcifications of the abdominal aorta and branch vessels, although vessels remain patent.  No suspicious abdominopelvic lymphadenopathy. Reproductive: Prostate is unremarkable. Other: No abdominopelvic ascites. Musculoskeletal: Mild degenerative changes of the lumbar spine. IMPRESSION: 3.4 cm calculus in the right renal pelvis with associated urothelial thickening/stranding. Two additional nonobstructing right renal calculi measuring up to 10 mm in the lower pole. No hydronephrosis. Additional ancillary findings as above. Electronically Signed   By: Charline Bills M.D.   On: 10/27/2023 00:10     Assessment/Plan Carotid artery stenosis Carotid duplex shows stable 1 to 39% ICA stenosis bilaterally without significant progression from previous studies.  No role for intervention.  Continue medical therapy.  Follow-up in 1 year with carotid duplex.  Essential hypertension blood pressure control important in reducing the progression of atherosclerotic disease. On appropriate oral medications.     Stage 3a chronic kidney disease (HCC) Follow with duplex and avoid contrast if possible.    Hyperlipidemia lipid control important in reducing the progression of atherosclerotic disease. Continue statin therapy  Festus Barren, MD  11/04/2023 2:26 PM    This note was created with Dragon medical transcription system.  Any errors from dictation are purely unintentional

## 2023-11-05 LAB — BASIC METABOLIC PANEL WITH GFR
BUN/Creatinine Ratio: 19 (ref 10–24)
BUN: 24 mg/dL (ref 8–27)
CO2: 22 mmol/L (ref 20–29)
Calcium: 10 mg/dL (ref 8.6–10.2)
Chloride: 98 mmol/L (ref 96–106)
Creatinine, Ser: 1.24 mg/dL (ref 0.76–1.27)
Glucose: 94 mg/dL (ref 70–99)
Potassium: 5.5 mmol/L — ABNORMAL HIGH (ref 3.5–5.2)
Sodium: 136 mmol/L (ref 134–144)
eGFR: 63 mL/min/{1.73_m2} (ref >59)

## 2023-11-05 NOTE — Telephone Encounter (Signed)
 Called pt he states that if we send in the liquid form of potassium citrate he would not be able to afford it currently. Advised pt to call back when he would be able to afford medication and we can send in at that time. In the interim advised that it is important he continue to take tablet form.

## 2023-11-17 ENCOUNTER — Encounter: Payer: Self-pay | Admitting: Family Medicine

## 2023-11-17 ENCOUNTER — Ambulatory Visit (INDEPENDENT_AMBULATORY_CARE_PROVIDER_SITE_OTHER): Payer: Medicare Other | Admitting: Family Medicine

## 2023-11-17 VITALS — BP 114/78 | HR 68 | Temp 97.7°F | Resp 20 | Ht 69.0 in | Wt 196.1 lb

## 2023-11-17 DIAGNOSIS — Z79899 Other long term (current) drug therapy: Secondary | ICD-10-CM | POA: Diagnosis not present

## 2023-11-17 DIAGNOSIS — F39 Unspecified mood [affective] disorder: Secondary | ICD-10-CM | POA: Diagnosis not present

## 2023-11-17 MED ORDER — ALPRAZOLAM 0.5 MG PO TABS: 0.5000 mg | ORAL_TABLET | Freq: Three times a day (TID) | ORAL | 5 refills | Status: DC | PRN

## 2023-11-17 NOTE — Progress Notes (Signed)
 SUBJECTIVE:   Chief Complaint  Patient presents with   mood disorder   HPI Presents for follow up chronic disease management  Discussed the use of AI scribe software for clinical note transcription with the patient, who gave verbal consent to proceed.  History of Present Illness Ian Mills is a 68 year old male who presents for follow-up regarding kidney stones and severe back pain.  He has a kidney stone measuring over three centimeters and is under the care of Dr. Jerelyn Money. He is taking potassium citrate  for management and prefers medication over surgical intervention. He has made dietary changes, reducing intake of high-protein foods, particularly those with a face, and has cut down on red meat, resulting in weight loss.  He experiences severe back pain and is scheduled to receive another injection on May 1st. The injections provide temporary relief, but he is required to undergo three nerve block injections before Medicare will cover more definitive treatment. He has already received two nerve blocks and is awaiting the third. The pain significantly impacts his daily activities, making it difficult to perform tasks he normally does.  He is currently taking Xanax , with five refills provided, and has had issues with pharmacy availability in the past, requiring a transfer from [facility] to [facility]. He also takes Entresto  and Farxiga , which are managed by his cardiologist.  No chest pain, shortness of breath, swelling, or weight gain. He mentions having problems with urination, which affects his ability to engage in activities like kayaking and fishing.    PERTINENT PMH / PSH: As above  OBJECTIVE:  BP 114/78   Pulse 68   Temp 97.7 F (36.5 C)   Resp 20   Ht 5\' 9"  (1.753 m)   Wt 196 lb 2 oz (89 kg)   SpO2 99%   BMI 28.96 kg/m    Physical Exam Vitals reviewed.  Constitutional:      General: He is not in acute distress.    Appearance: Normal appearance. He is obese. He is not  ill-appearing, toxic-appearing or diaphoretic.  Eyes:     General:        Right eye: No discharge.        Left eye: No discharge.  Cardiovascular:     Rate and Rhythm: Normal rate and regular rhythm.     Heart sounds: Normal heart sounds.  Pulmonary:     Effort: Pulmonary effort is normal.     Breath sounds: Normal breath sounds.  Musculoskeletal:        General: Normal range of motion.     Cervical back: Normal range of motion.  Skin:    General: Skin is warm and dry.  Neurological:     Mental Status: He is alert and oriented to person, place, and time. Mental status is at baseline.  Psychiatric:        Mood and Affect: Mood normal.        Behavior: Behavior normal.        Thought Content: Thought content normal.        Judgment: Judgment normal.           11/17/2023    1:13 PM 05/07/2023   10:46 AM 12/25/2022    2:32 PM 09/23/2022   10:53 AM 08/26/2022    2:52 PM  Depression screen PHQ 2/9  Decreased Interest 0 0 0 0 0  Down, Depressed, Hopeless 0 0 0 0 0  PHQ - 2 Score 0 0 0 0 0  Altered sleeping 0 1  0 0 0  Tired, decreased energy 0 0 0 0 0  Change in appetite 0 0 0 0 0  Feeling bad or failure about yourself  0 0 0 0 0  Trouble concentrating 0 0 0 0 0  Moving slowly or fidgety/restless 0 0 0 0 0  Suicidal thoughts 0 0 0 0 0  PHQ-9 Score 0 1 0 0 0  Difficult doing work/chores Not difficult at all Not difficult at all Not difficult at all Not difficult at all Not difficult at all      11/17/2023    1:14 PM 12/25/2022    2:33 PM 08/26/2022    2:52 PM 08/15/2022    1:31 PM  GAD 7 : Generalized Anxiety Score  Nervous, Anxious, on Edge 0 0 0 0  Control/stop worrying 0 0 0 0  Worry too much - different things 0 0 0 0  Trouble relaxing 0 0 0 0  Restless 0 0 0 0  Easily annoyed or irritable 0 0 0 0  Afraid - awful might happen 0 0 0 0  Total GAD 7 Score 0 0 0 0  Anxiety Difficulty Not difficult at all Not difficult at all Not difficult at all Not difficult at all     ASSESSMENT/PLAN:  Mood disorder [facility]) Assessment & Plan: Stable on Xanax  3 times daily. No reported issues with medication. - Refill Xanax  prescription with 90 tablets and 5 refills. - Sign new medication contract. - UDS per protocol today  Orders: -     ALPRAZolam ; Take 1 tablet (0.5 mg total) by mouth 3 (three) times daily as needed. for anxiety  Dispense: 90 tablet; Refill: 5  Controlled substance agreement signed -     ToxASSURE Select 13 (MW), Urine     PDMP reviewed  Return if symptoms worsen or fail to improve, for PCP.  Valli Gaw, MD

## 2023-11-17 NOTE — Patient Instructions (Addendum)
 It was a pleasure meeting you today. Thank you for allowing me to take part in your health care.  Our goals for today as we discussed include:  Refills sent for requested medication  Follow up with Urology as scheduled   This is a list of the screening recommended for you and due dates:  Health Maintenance  Topic Date Due   Zoster (Shingles) Vaccine (2 of 2) 06/24/2022   COVID-19 Vaccine (7 - 2024-25 season) 03/30/2023   Yearly kidney health urinalysis for diabetes  04/30/2027*   Flu Shot  02/27/2024   Medicare Annual Wellness Visit  05/06/2024   Screening for Lung Cancer  09/08/2024   Yearly kidney function blood test for diabetes  11/03/2024   DTaP/Tdap/Td vaccine (3 - Td or Tdap) 03/03/2030   Colon Cancer Screening  12/23/2030   Pneumonia Vaccine  Completed   Hepatitis C Screening  Completed   HPV Vaccine  Aged Out   Meningitis B Vaccine  Aged Out   Complete foot exam   Discontinued   Hemoglobin A1C  Discontinued   Eye exam for diabetics  Discontinued  *Topic was postponed. The date shown is not the original due date.      If you have any questions or concerns, please do not hesitate to call the office at [phone].  I look forward to our next visit and until then take care and stay safe.  Regards,   Valli Gaw, MD   [name]

## 2023-11-18 ENCOUNTER — Telehealth: Payer: Self-pay | Admitting: Urology

## 2023-11-18 DIAGNOSIS — N200 Calculus of kidney: Secondary | ICD-10-CM

## 2023-11-18 NOTE — Telephone Encounter (Signed)
 PT called due to PT went to see a physician and pt was told his Potassium was too high after starting Potassium Citrate  15 MEQ . Pt would like a call back on the next steps for this medication?

## 2023-11-18 NOTE — Telephone Encounter (Signed)
 Your most recent mychart note states that Potassium levels are ok, however it appears patient was seen by PCP and given conflicting information. Please advise.

## 2023-11-19 LAB — TOXASSURE SELECT 13 (MW), URINE

## 2023-11-19 NOTE — Telephone Encounter (Signed)
 Called Ian Mills no answer. Left detailed message for Ian Mills informing him of recommendation from Dr. Estanislao Heimlich to decreased potassium and recheck levels in 1 week. Potassium ordered.

## 2023-11-23 ENCOUNTER — Encounter: Payer: Self-pay | Admitting: Family Medicine

## 2023-11-23 DIAGNOSIS — Z79899 Other long term (current) drug therapy: Secondary | ICD-10-CM | POA: Insufficient documentation

## 2023-11-23 NOTE — Assessment & Plan Note (Signed)
 Stable on Xanax  3 times daily. No reported issues with medication. - Refill Xanax  prescription with 90 tablets and 5 refills. - Sign new medication contract. - UDS per protocol today

## 2023-11-26 ENCOUNTER — Other Ambulatory Visit

## 2023-11-26 DIAGNOSIS — I10 Essential (primary) hypertension: Secondary | ICD-10-CM

## 2023-11-26 DIAGNOSIS — R1011 Right upper quadrant pain: Secondary | ICD-10-CM | POA: Diagnosis not present

## 2023-11-27 DIAGNOSIS — M47816 Spondylosis without myelopathy or radiculopathy, lumbar region: Secondary | ICD-10-CM | POA: Diagnosis not present

## 2023-11-27 DIAGNOSIS — G8929 Other chronic pain: Secondary | ICD-10-CM | POA: Diagnosis not present

## 2023-11-27 DIAGNOSIS — M5136 Other intervertebral disc degeneration, lumbar region with discogenic back pain only: Secondary | ICD-10-CM | POA: Diagnosis not present

## 2023-11-27 LAB — BASIC METABOLIC PANEL WITH GFR
BUN/Creatinine Ratio: 20 (ref 10–24)
BUN: 25 mg/dL (ref 8–27)
CO2: 15 mmol/L — ABNORMAL LOW (ref 20–29)
Calcium: 9.4 mg/dL (ref 8.6–10.2)
Chloride: 102 mmol/L (ref 96–106)
Creatinine, Ser: 1.25 mg/dL (ref 0.76–1.27)
Glucose: 94 mg/dL (ref 70–99)
Potassium: 5 mmol/L (ref 3.5–5.2)
Sodium: 134 mmol/L (ref 134–144)
eGFR: 62 mL/min/{1.73_m2} (ref >59)

## 2023-12-08 ENCOUNTER — Ambulatory Visit: Payer: Medicare Other

## 2023-12-08 DIAGNOSIS — I472 Ventricular tachycardia, unspecified: Secondary | ICD-10-CM

## 2023-12-09 DIAGNOSIS — M5136 Other intervertebral disc degeneration, lumbar region with discogenic back pain only: Secondary | ICD-10-CM | POA: Diagnosis not present

## 2023-12-09 DIAGNOSIS — F17210 Nicotine dependence, cigarettes, uncomplicated: Secondary | ICD-10-CM | POA: Diagnosis not present

## 2023-12-09 DIAGNOSIS — M47816 Spondylosis without myelopathy or radiculopathy, lumbar region: Secondary | ICD-10-CM | POA: Diagnosis not present

## 2023-12-09 LAB — CUP PACEART REMOTE DEVICE CHECK
Battery Remaining Longevity: 8 mo
Battery Remaining Percentage: 7 %
Brady Statistic RV Percent Paced: 0 %
Date Time Interrogation Session: 20250512022100
HighPow Impedance: 92 Ohm
Implantable Lead Connection Status: 753985
Implantable Lead Implant Date: 20130503
Implantable Lead Location: 753860
Implantable Lead Model: 292
Implantable Lead Serial Number: 112568
Implantable Pulse Generator Implant Date: 20130503
Lead Channel Impedance Value: 673 Ohm
Lead Channel Pacing Threshold Amplitude: 0.5 V
Lead Channel Pacing Threshold Pulse Width: 0.5 ms
Lead Channel Setting Pacing Amplitude: 2.4 V
Lead Channel Setting Pacing Pulse Width: 0.5 ms
Lead Channel Setting Sensing Sensitivity: 0.6 mV
Pulse Gen Serial Number: 105232
Zone Setting Status: 755011

## 2023-12-16 ENCOUNTER — Encounter (INDEPENDENT_AMBULATORY_CARE_PROVIDER_SITE_OTHER): Payer: Self-pay

## 2023-12-17 ENCOUNTER — Ambulatory Visit: Payer: Self-pay | Admitting: Cardiology

## 2023-12-29 ENCOUNTER — Encounter: Payer: Self-pay | Admitting: Acute Care

## 2024-01-06 ENCOUNTER — Encounter: Payer: Self-pay | Admitting: Cardiology

## 2024-01-06 ENCOUNTER — Ambulatory Visit: Attending: Cardiology | Admitting: Cardiology

## 2024-01-06 VITALS — BP 100/60 | HR 70 | Ht 69.0 in | Wt 193.1 lb

## 2024-01-06 DIAGNOSIS — I25118 Atherosclerotic heart disease of native coronary artery with other forms of angina pectoris: Secondary | ICD-10-CM | POA: Diagnosis not present

## 2024-01-06 DIAGNOSIS — I472 Ventricular tachycardia, unspecified: Secondary | ICD-10-CM | POA: Insufficient documentation

## 2024-01-06 DIAGNOSIS — I5022 Chronic systolic (congestive) heart failure: Secondary | ICD-10-CM | POA: Diagnosis not present

## 2024-01-06 DIAGNOSIS — I6523 Occlusion and stenosis of bilateral carotid arteries: Secondary | ICD-10-CM | POA: Insufficient documentation

## 2024-01-06 DIAGNOSIS — I10 Essential (primary) hypertension: Secondary | ICD-10-CM | POA: Insufficient documentation

## 2024-01-06 DIAGNOSIS — I255 Ischemic cardiomyopathy: Secondary | ICD-10-CM | POA: Diagnosis not present

## 2024-01-06 DIAGNOSIS — E785 Hyperlipidemia, unspecified: Secondary | ICD-10-CM | POA: Diagnosis not present

## 2024-01-06 DIAGNOSIS — N1831 Chronic kidney disease, stage 3a: Secondary | ICD-10-CM | POA: Diagnosis not present

## 2024-01-06 DIAGNOSIS — Z8639 Personal history of other endocrine, nutritional and metabolic disease: Secondary | ICD-10-CM | POA: Insufficient documentation

## 2024-01-06 MED ORDER — EZETIMIBE 10 MG PO TABS: 10.0000 mg | ORAL_TABLET | Freq: Every day | ORAL | 3 refills | Status: AC

## 2024-01-06 MED ORDER — DAPAGLIFLOZIN PROPANEDIOL 10 MG PO TABS: 10.0000 mg | ORAL_TABLET | Freq: Every day | ORAL | 3 refills | Status: DC

## 2024-01-06 MED ORDER — SPIRONOLACTONE 25 MG PO TABS: 25.0000 mg | ORAL_TABLET | Freq: Every day | ORAL | 3 refills | Status: AC

## 2024-01-06 MED ORDER — ATORVASTATIN CALCIUM 80 MG PO TABS: 80.0000 mg | ORAL_TABLET | Freq: Every day | ORAL | 3 refills | Status: DC

## 2024-01-06 MED ORDER — CLOPIDOGREL BISULFATE 75 MG PO TABS: 75.0000 mg | ORAL_TABLET | Freq: Every day | ORAL | 3 refills | Status: AC

## 2024-01-06 MED ORDER — TORSEMIDE 20 MG PO TABS: 20.0000 mg | ORAL_TABLET | ORAL | 3 refills | Status: AC | PRN

## 2024-01-06 MED ORDER — CARVEDILOL 3.125 MG PO TABS: 3.1250 mg | ORAL_TABLET | Freq: Two times a day (BID) | ORAL | 3 refills | Status: DC

## 2024-01-06 MED ORDER — ENTRESTO 49-51 MG PO TABS: 1.0000 | ORAL_TABLET | Freq: Two times a day (BID) | ORAL | Status: AC

## 2024-01-06 MED ORDER — ENTRESTO 49-51 MG PO TABS: 1.0000 | ORAL_TABLET | Freq: Two times a day (BID) | ORAL | 3 refills | Status: DC

## 2024-01-06 NOTE — Progress Notes (Signed)
 Cardiology Office Note   Date:  01/06/2024  ID:  Ian Mills, DOB January 18, 1956, MRN [id number] PCP: Valli Gaw, MD  [facility] Providers Cardiologist:  Belva Boyden, MD Electrophysiologist:  Richardo Chandler, MD     History of Present Illness Ian Mills is a 68 y.o. 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   Patient previously undergone PCI to the LAD, OM 3, PDA in 08/2006.  Subsequent cardiac cath 12/14 revealed severe proximal and mid RCA stenosis requiring PCI.  Echo in 01/2018 showed an EF of 30%.  He underwent a Lexiscan  Myoview  in 08/10/2019 which showed a large in size, moderate to severe, minimally reversible mid and apical anterior, anteroseptal, and anterior lateral defect that was consistent with scar possible mild peri-infarct ischemia.  There was also moderate in size moderate to severe, mid and apical fixed inferior/inferior lateral defect consistent with scar.  LVEF was less than 30% with mid and apical akinesis.  He was scheduled for left heart catheterization which showed significant underlying three-vessel coronary artery disease with chronically occluded mid LAD, patent first diagonal stent, patent RCA stents with mild in-stent restenosis, patent left circumflex stent with moderate in-stent restenosis.  Progression of ostial left circumflex stenosis to 60%.  Left ventricular angiography was not performed due to chronic kidney disease.  Moderately elevated left ventricular end systolic pressure measuring 25-30 mmHg.  He was recommended he continue aggressive medical therapy with no indication for revascularization.  CT of the chest revealed aortic atherosclerosis, sequela of prior coronary stenting  was noted.  There were also some scattered groundglass and ill-defined nodular basal disease in the bilateral lungs which could have reflected edema or atypical infection.  He was noted pulmonary nodules were previously patent and followed with recommendation for the patient to continue lung cancer screening CTs with his PCP.  He continues to follow with heart failure clinic and was receiving Entresto  through [employer] patient assistance.  Continue to follow with the EP for ICD downloads and device management with no arrhythmias noted on device.  He was hospitalized 08/2022 [name] feels presenting with left shoulder pain he reports that he ruled out cardiac issue.  Repeat echocardiogram revealed an EF of 25%.  He was scheduled to see orthopedics and was noted to have a tear in his rotator cuff on the left side.  He had carotid duplexes completed which revealed bilateral ICAs were consistent with 1-99% stenosis.  Was seen in clinic 10/22/2022 to recent hospitalization at [facility] for rotator cuff tear.  He was also noted to have carotid artery stenosis and had been following Dr. Vonna Guardian from vascular.  He had noted improvement in episodes secondary to orthostasis but without syncope or near syncope.  Blood pressure been stable with medication changes.  He was only taken diuretic therapy on as-needed basis and was euvolemic on exam.  He followed up with Dr. Rodolfo Clan from EP on 12/24/2022.  Was continued on Entresto , spironolactone , and carvedilol .  There was a question at the time if he was able to come off clopidogrel  since it was not indicated.  No interval ventricular tachycardia had been treated or noted since 2013 with device implantation and amiodarone  was subsequently discontinued.   He was last seen in clinic 06/30/2023  where he was doing well fro the cardiac perspective.  He continued to suffer from chronic back pain and was undergoing injections.  There were no medication changes that were made or further  testing that was ordered.  He returns to clinic today stating that from a cardiac perspective he has been doing well.  He denies any chest pain, shortness of breath, palpitations or peripheral edema.  He continues to have back discomfort and has upcoming procedure for denervation in his back after he has been receiving several injections.  He was also advised that he had a kidney stone was recently started on potassium citrate  with his potassium chloride  discontinued.  Unfortunately on repeat labs his potassium level was high and the amount of potassium citrate  that he was taken had to be reduced.  States that he has been compliant with his current medication regimen with any undue side effects.  Denies any hospitalizations or visits to the emergency department.  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  No acute changes from prior studies noted today.  2D echo 07/12/2021 1. LV apex not well visualized. apex appears akinetic. could not evaluate  for thrombus presence due to image quality. limited echo with enhancing  agent recommended.. Left ventricular ejection fraction, by estimation, is  25 to 30%. The left ventricle has   severely decreased function. Left ventricular endocardial border not  optimally defined to evaluate regional wall motion. Left ventricular  diastolic parameters are consistent with Grade I diastolic dysfunction  (impaired relaxation). There is akinesis of  the left ventricular, apical segment.   2. Right ventricular systolic function is normal. The right ventricular  size is normal.   3. The mitral valve is normal in structure. No evidence of mitral valve  regurgitation.   4. The aortic valve is tricuspid. Aortic valve regurgitation is not  visualized.   5. The inferior  vena cava is normal in size with greater than 50%  respiratory variability, suggesting right atrial pressure of 3 mmHg.    LHC 08/30/2019 Prox RCA to Mid RCA lesion is 20% stenosed. Prox RCA lesion is 30% stenosed. Prox LAD to Mid LAD lesion is 100% stenosed. 1st Diag-1 lesion is 60% stenosed. Previously placed 1st Diag-2 stent (unknown type) is widely patent. Ost Cx to Prox Cx lesion is 60% stenosed. Mid Cx lesion is 40% stenosed.   1. Significant underlying three-vessel coronary artery disease with chronically occluded mid LAD, patent first diagonal stent, patent RCA stents with mild in-stent restenosis, patent left circumflex stent with moderate in-stent restenosis.  Progression of ostial left circumflex stenosis to 60%. 2.  Left ventricular angiography was not performed due to chronic kidney disease. 3.  Moderately elevated left ventricular end-diastolic pressure between 25 to 30 mmHg.   Recommendations: Continue aggressive medical therapy.  No indication for revascularization. The patient is volume overloaded and will stop cath IV hydration.  Continue furosemide . Recommend checking renal function upon follow-up.   2D echo 02/13/2018 ([facility]) INTERPRETATION ---------------------------------------------------------------    SEVERE LV DYSFUNCTION (See above) WITH MILD LVH    NORMAL RIGHT VENTRICULAR SYSTOLIC FUNCTION    VALVULAR REGURGITATION: TRIVIAL MR, TRIVIAL TR    NO VALVULAR STENOSIS    2D echo 05/2014: EF 25 to 30%, akinesis of the mid-distal anterior, apical, and distal inferior  walls, moderate LVH, diastolic dysfunction, mildly to moderately increased LV internal cavity size, moderately dilated left atrium, mildly dilated right atrium, mild mitral regurgitation, mild aortic valve sclerosis without stenosis. Risk Assessment/Calculations           Physical Exam VS:  BP 100/60 (BP Location: Left Arm, Patient Position: Sitting, Cuff Size: Normal)   Pulse 70   Ht 5\' 9"  (1.753 m)    Wt 193 lb 2 oz (87.6 kg)   SpO2 98%   BMI 28.52 kg/m    Wt Readings from Last 3 Encounters:  01/06/24 193 lb 2 oz (87.6 kg)  11/17/23 196 lb 2 oz (89 kg)  11/04/23 196 lb 12.8 oz (89.3 kg)    GEN: Well nourished, well developed in no acute distress NECK: No JVD; No carotid bruits CARDIAC: RRR, no murmurs, rubs, gallops RESPIRATORY:  Clear to auscultation without rales, wheezing or rhonchi  ABDOMEN: Soft, non-tender, non-distended EXTREMITIES:  No edema; No deformity   ASSESSMENT AND PLAN Coronary disease of native coronary arteries with stable angina.  Patient continues to deny any recurrent anginal anginal equivalents.  EKG today revealed sinus rhythm with a chronic left anterior fascicular block with no acute ischemic changes found.  He is continued on aspirin  81 mg daily atorvastatin  80 mg daily, and clopidogrel  75 mg daily.  No further ischemic testing warranted at this time.  Chronic HFrEF secondary to ICM.  He is very well compensated and euvolemic on exam today.  Recent echocardiogram done at outside hospital revealed LVEF 25%.  He is continued on GDMT of carvedilol , Farxiga , Entresto , Aldactone , and torsemide  on a as needed basis.  He continues to suffer from NYHA class I-II symptoms.  No longer follows with advanced heart failure.  Is requesting refills on all of his medications today.  History of ventricular tachycardia status post ICD implantation with her most recent device check completed on 12/08/2023 with remote defibrillator interrogation reviewed.  He presented rhythm with V sensed, battery lead parameters stable with stable capture and sensing, device programming was appropriate, no device therapies, short NSVT and VT episodes have similar morphology to intrinsic but likely burst of SVT, no sustained arrhythmias noted continue with remote monitoring.  Primary hypertension with a blood pressure today 100/60.  Blood pressure remained stable.  He is continued on his current  therapy without changes today.  He has been encouraged to continue to monitor his pressures 1 to 2 hours postmedication administration as well.  Mixed hyperlipidemia with LDL 68 which remains at goal below 70 but ideally goal would be 55.  He is continued on atorvastatin  80 mg daily and ezetimibe  10 mg daily.  He has an upcoming labs with new PCP.  If need to escalate or further therapy after updated labs will consider changing medications.  CKD stage II-III with his last serum creatinine of 1.25.  Has remained stable.  He continues to follow with nephrology.  Carotid artery disease where he continues to be followed with [facility] with surveillance carotid duplexes.  Tobacco abuse total cessation from use is recommended.  Recent amount of hyperkalemia after being placed on potassium citrate .  Updated BMP ordered today to reevaluate kidney function and potassium levels.       Dispo: Patient to return to clinic to see MD/APP in 6 months or sooner if needed for further evaluation  Signed, Aryn Kops, NP

## 2024-01-06 NOTE — Patient Instructions (Signed)
 Medication Instructions:  Your physician recommends that you continue on your current medications as directed. Please refer to the Current Medication list given to you today.   *If you need a refill on your cardiac medications before your next appointment, please call your pharmacy*  Lab Work: Your provider would like for you to have following labs drawn today BMP.   If you have labs (blood work) drawn today and your tests are completely normal, you will receive your results only by: MyChart Message (if you have MyChart) OR A paper copy in the mail If you have any lab test that is abnormal or we need to change your treatment, we will call you to review the results.  Testing/Procedures: No test ordered today   Follow-Up: At [facility], you and your health needs are our priority.  As part of our continuing mission to provide you with exceptional heart care, our providers are all part of one team.  This team includes your primary Cardiologist (physician) and Advanced Practice Providers or APPs (Physician Assistants and Nurse Practitioners) who all work together to provide you with the care you need, when you need it.  Your next appointment:   6 month(s)  Provider:   Timothy Gollan, MD or Ronald Cockayne, NP

## 2024-01-07 LAB — BASIC METABOLIC PANEL WITH GFR
BUN/Creatinine Ratio: 18 (ref 10–24)
BUN: 19 mg/dL (ref 8–27)
CO2: 21 mmol/L (ref 20–29)
Calcium: 9.8 mg/dL (ref 8.6–10.2)
Chloride: 102 mmol/L (ref 96–106)
Creatinine, Ser: 1.04 mg/dL (ref 0.76–1.27)
Glucose: 93 mg/dL (ref 70–99)
Potassium: 4.4 mmol/L (ref 3.5–5.2)
Sodium: 138 mmol/L (ref 134–144)
eGFR: 77 mL/min/{1.73_m2} (ref >59)

## 2024-01-08 ENCOUNTER — Ambulatory Visit: Payer: Self-pay | Admitting: Cardiology

## 2024-01-08 DIAGNOSIS — M47816 Spondylosis without myelopathy or radiculopathy, lumbar region: Secondary | ICD-10-CM | POA: Diagnosis not present

## 2024-01-08 DIAGNOSIS — M5136 Other intervertebral disc degeneration, lumbar region with discogenic back pain only: Secondary | ICD-10-CM | POA: Diagnosis not present

## 2024-01-08 NOTE — Progress Notes (Signed)
 Blood work has remained stable.  Potassium was down to 4.4 from previous 5.5, 2 months prior.  No changes to current medication regimen at this time.

## 2024-01-13 NOTE — Telephone Encounter (Signed)
Patient called to follow-up on his lab results. 

## 2024-01-13 NOTE — Progress Notes (Signed)
 Labs remained stable.  Continue current medication regimen without changes at this time.

## 2024-01-19 ENCOUNTER — Ambulatory Visit

## 2024-01-22 DIAGNOSIS — G8929 Other chronic pain: Secondary | ICD-10-CM | POA: Diagnosis not present

## 2024-01-22 DIAGNOSIS — M47816 Spondylosis without myelopathy or radiculopathy, lumbar region: Secondary | ICD-10-CM | POA: Diagnosis not present

## 2024-01-22 NOTE — Addendum Note (Signed)
 Addended by: TAWNI DRILLING D on: 01/22/2024 09:09 AM   Modules accepted: Orders

## 2024-01-22 NOTE — Progress Notes (Signed)
 Remote ICD transmission.

## 2024-01-26 ENCOUNTER — Ambulatory Visit
Admission: RE | Admit: 2024-01-26 | Discharge: 2024-01-26 | Disposition: A | Discharge status: Home / Self Care | Source: Ambulatory Visit | Attending: Urology | Admitting: Urology

## 2024-01-26 DIAGNOSIS — N200 Calculus of kidney: Secondary | ICD-10-CM | POA: Diagnosis not present

## 2024-01-26 DIAGNOSIS — R1011 Right upper quadrant pain: Secondary | ICD-10-CM | POA: Insufficient documentation

## 2024-01-26 DIAGNOSIS — R109 Unspecified abdominal pain: Secondary | ICD-10-CM | POA: Diagnosis not present

## 2024-02-05 ENCOUNTER — Telehealth: Payer: Self-pay | Admitting: Urology

## 2024-02-05 ENCOUNTER — Ambulatory Visit: Admitting: Urology

## 2024-02-05 DIAGNOSIS — M47816 Spondylosis without myelopathy or radiculopathy, lumbar region: Secondary | ICD-10-CM | POA: Diagnosis not present

## 2024-02-05 DIAGNOSIS — M47815 Spondylosis without myelopathy or radiculopathy, thoracolumbar region: Secondary | ICD-10-CM | POA: Diagnosis not present

## 2024-02-05 DIAGNOSIS — G8929 Other chronic pain: Secondary | ICD-10-CM | POA: Diagnosis not present

## 2024-02-05 NOTE — Telephone Encounter (Signed)
**Note De-identified  [place] Obfuscation** Please advise 

## 2024-02-05 NOTE — Telephone Encounter (Signed)
 Pt called to receive results from his CT scan completed on 01/26/24. Pt would like a call back at [phone]

## 2024-02-19 ENCOUNTER — Telehealth: Payer: Self-pay

## 2024-02-19 MED ORDER — DAPAGLIFLOZIN PROPANEDIOL 10 MG PO TABS: 10.0000 mg | ORAL_TABLET | Freq: Every day | ORAL | 3 refills | Status: DC

## 2024-02-19 NOTE — Addendum Note (Signed)
 Addended by: CHAUVIGNE, Elouise Divelbiss on: 02/19/2024 09:40 AM   Modules accepted: Orders

## 2024-02-19 NOTE — Telephone Encounter (Signed)
 Received a fax notice in the [facility] Patient [employer] que requesting a refill for FARXIGA  (see chart media). Please send in prescription for FARXIGA  to [facility]

## 2024-02-19 NOTE — Telephone Encounter (Signed)
 Prescription has been sent in

## 2024-03-02 ENCOUNTER — Encounter: Payer: Self-pay | Admitting: Pharmacist

## 2024-03-02 ENCOUNTER — Other Ambulatory Visit: Payer: Self-pay | Admitting: Pharmacist

## 2024-03-02 NOTE — Progress Notes (Unsigned)
 Brief Telephone Documentation Reason: Medication Access  Summary of Call: Patient enrolled in [employer] PAP for Stiolto Respimat  inhaler.  PAP form submitted December 2024 with PCP as Dr. Homer. Care was transitioned to Dr. Hope.  TOC visit scheduled with Gretel.   Medication refill request to Doc of the day clinical pool Dietitian).   Follow Up: Patient given direct line for further questions/concerns.  Manuelita FABIENE Kobs, PharmD Clinical Pharmacist [facility] [phone]

## 2024-03-02 NOTE — Progress Notes (Signed)
 In error

## 2024-03-03 MED ORDER — STIOLTO RESPIMAT 2.5-2.5 MCG/ACT IN AERS: 2.0000 | INHALATION_SPRAY | Freq: Every day | RESPIRATORY_TRACT | 3 refills | Status: AC

## 2024-03-08 ENCOUNTER — Ambulatory Visit: Admitting: Urology

## 2024-03-08 ENCOUNTER — Ambulatory Visit: Payer: Medicare Other

## 2024-03-08 DIAGNOSIS — I472 Ventricular tachycardia, unspecified: Secondary | ICD-10-CM | POA: Diagnosis not present

## 2024-03-09 ENCOUNTER — Telehealth: Payer: Self-pay

## 2024-03-09 DIAGNOSIS — I255 Ischemic cardiomyopathy: Secondary | ICD-10-CM

## 2024-03-09 LAB — CUP PACEART REMOTE DEVICE CHECK
Battery Remaining Longevity: 8 mo
Battery Remaining Percentage: 7 %
Brady Statistic RV Percent Paced: 0 %
Date Time Interrogation Session: 20250811023500
HighPow Impedance: 93 Ohm
Implantable Lead Connection Status: 753985
Implantable Lead Implant Date: 20130503
Implantable Lead Location: 753860
Implantable Lead Model: 292
Implantable Lead Serial Number: 112568
Implantable Pulse Generator Implant Date: 20130503
Lead Channel Impedance Value: 660 Ohm
Lead Channel Pacing Threshold Amplitude: 0.5 V
Lead Channel Pacing Threshold Pulse Width: 0.5 ms
Lead Channel Setting Pacing Amplitude: 2.4 V
Lead Channel Setting Pacing Pulse Width: 0.5 ms
Lead Channel Setting Sensing Sensitivity: 0.6 mV
Pulse Gen Serial Number: 105232
Zone Setting Status: 755011

## 2024-03-09 NOTE — Telephone Encounter (Signed)
 Alert received from [employer] for Presenting rhythm:  Regular VS ~170 bpm Multiple NSVT, VT events 8/11, 01:58-02:34, longest duration 38sec, HR's 169-180, similar morphology to presenting - route to triage for increased HR's.  Patient called to send another transmission. States he will send in a few minutes. Patient reports of kidney stone for several months and had increased pain over the past few days. Pt is taking tylenol  for pain. Denies any cardiac symptoms such as chest pain, shortness of breath, dizziness, lightheadedness or any other symptoms. Pt reports compliant with Coreg  3.125 mg BID. Has not been able to take potassium.   Will check presenting after new transmission is sent.

## 2024-03-09 NOTE — Telephone Encounter (Signed)
 Presenting morphology appears same as todays presenting at a controlled rate.   Routing to Dr. Kennyth to advise if any further action is needed. Patient reports today feeling better.

## 2024-03-10 ENCOUNTER — Ambulatory Visit (INDEPENDENT_AMBULATORY_CARE_PROVIDER_SITE_OTHER): Admitting: Urology

## 2024-03-10 ENCOUNTER — Other Ambulatory Visit: Payer: Self-pay

## 2024-03-10 ENCOUNTER — Telehealth: Payer: Self-pay

## 2024-03-10 VITALS — BP 125/82 | HR 64 | Ht 69.0 in | Wt 191.1 lb

## 2024-03-10 DIAGNOSIS — N200 Calculus of kidney: Secondary | ICD-10-CM

## 2024-03-10 LAB — URINALYSIS, COMPLETE
Bilirubin, UA: NEGATIVE
Ketones, UA: NEGATIVE
Leukocytes,UA: NEGATIVE
Nitrite, UA: NEGATIVE
Protein,UA: NEGATIVE
Specific Gravity, UA: 1.02 (ref 1.005–1.030)
Urobilinogen, Ur: 0.2 mg/dL (ref 0.2–1.0)
pH, UA: 6 (ref 5.0–7.5)

## 2024-03-10 LAB — MICROSCOPIC EXAMINATION

## 2024-03-10 NOTE — Progress Notes (Signed)
   03/10/2024 1:25 PM   Ian Mills [birth year] [id number]  Reason for visit: Follow up right renal stone, history of BPH/ED  HPI: 68 year old male with history of urinary retention in 2019 treated with a TURP in [place], no urinary problems since that time.  PSA levels have been less than 1, and screening discontinued based on his age.  He uses sildenafil  100 mg on demand for ED.  He was recently found to have a large 3.5 cm right renal pelvis stone and 1 cm lower pole stone, no hydronephrosis, density 400 HU consistent with a uric acid stone, and he opted to try alkalinization therapy.  He did not tolerate potassium citrate  well with stomach upset and difficulty affording this medication.  I personally viewed and interpreted the most recent CT from 01/26/2024 showing decrease in size of the right renal stone to 2.5 cm.  We discussed various treatment options for urolithiasis including observation with or without medical expulsive therapy, shockwave lithotripsy (SWL), ureteroscopy and laser lithotripsy with stent placement, and percutaneous nephrolithotomy.  We discussed that management is based on stone size, location, density, patient co-morbidities, and patient preference.  We discussed considering ongoing alkalinization therapy, but he is not tolerating potassium citrate  well.  Stones <24mm in size have a >80% spontaneous passage rate. Data surrounding the use of tamsulosin for medical expulsive therapy is controversial, but meta analyses suggests it is most efficacious for distal stones between 5-45mm in size. Possible side effects include dizziness/lightheadedness, and retrograde ejaculation.Ureteroscopy with laser lithotripsy and stent placement has a higher stone free rate than SWL in a single procedure, however increased complication rate including possible infection, ureteral injury, bleeding, and stent related morbidity. Common stent related symptoms include dysuria, urgency/frequency, and  flank pain.PCNL is the favored treatment for stones >2cm. It involves a small incision in the flank, with complete fragmentation of stones and removal. It has the highest stone free rate, but also the highest complication rate. Possible complications include bleeding, infection/sepsis, injury to surrounding organs including the pleura, and collecting system injury.   After an extensive discussion of the risks and benefits of the above treatment options, the patient would like to proceed with right ureteroscopy, laser lithotripsy, stent placement.  Samples of litholyte given, will need to continue stone type of alkalinization/citrate therapy postop to prevent recurrence of uric acid stone   Ian JAYSON Burnet, MD  [facility] [address], [address] [place], [place] [address] [phone]

## 2024-03-10 NOTE — Progress Notes (Signed)
 Surgical Physician Order Form [facility]  Dr. Redell Burnet, MD  * Scheduling expectation : Patient prefers late September or October  *Length of Case: 2 hours  *Clearance needed: no  *Anticoagulation Instructions: Hold all anticoagulants  *Aspirin  Instructions: Ok to continue Aspirin   *Post-op visit Date/Instructions: 2 weeks stent removal  *Diagnosis: Right Nephrolithiasis  *Procedure: right Ureteroscopy w/laser lithotripsy & stent placement (47643)   Additional orders: N/A  -Admit type: OUTpatient  -Anesthesia: General  -VTE Prophylaxis Standing Order SCD's       Other:   -Standing Lab Orders Per Anesthesia    Lab other: UA&Urine Culture sent 8/13  -Standing Test orders EKG/Chest x-ray per Anesthesia       Test other:   - Medications:  Ancef  2gm IV  -Other orders:  N/A

## 2024-03-10 NOTE — Progress Notes (Addendum)
   [facility] Surgical Posting Form  Surgery Date: Date: 04/05/2024  Surgeon: Dr. Redell Burnet, MD  Inpt ( No  )   Outpt (Yes)   Obs ( No  )   Diagnosis: N20.0 Right Nephrolithiasis  -CPT: [id number]  Surgery: Right Ureteroscopy with Laser Lithotripsy and Stent Placement  Stop Anticoagulations: No, may continue all  Cardiac/Medical/Pulmonary Clearance needed: Yes  Clearance needed from Dr: Lucio Care  Clearance request sent on: Date: 03/10/24  *Orders entered into EPIC  Date: 03/10/24   *Case booked in EPIC  Date: 03/10/24  *Notified pt of Surgery: Date: 03/10/24  PRE-OP UA & CX: yes, sent on 03/10/2024  *Placed into Prior Authorization Work [place] Date: 03/10/24  Assistant/laser/rep:No

## 2024-03-10 NOTE — Progress Notes (Addendum)
  Phone Number: [phone] for Surgical Coordinator Fax Number: [phone]  REQUEST FOR SURGICAL CLEARANCE      Date: 03/10/2024  [facility]axed to: [facility]  Surgeon: Dr. Redell Burnet, MD     Date of Surgery:04/05/2024  Operation: Right Ureteroscopy with Laser Lithotripsy and Stent Placement   Anesthesia Type: General   Diagnosis: Right Nephrolithiasis  Patient Requires:   Cardiac / Vascular Clearance : Yes  Reason: Patient will need clearance for defibrillator. Patient may continue Plavix - does not need stop Plavix  or ASA for this procedure   Risk Assessment:    Low   []       Moderate   []     High   []           This patient is optimized for surgery  YES []       NO   []    I recommend further assessment/workup prior to surgery. YES []      NO  []   Appointment scheduled for: _______________________   Further recommendations: ____________________________________     Physician Signature:__________________________________   Printed Name: ________________________________________   Date: _________________

## 2024-03-10 NOTE — Patient Instructions (Signed)
 Laser Therapy for Kidney Stones Laser therapy for kidney stones is a procedure to break up rock-like masses that form inside the kidneys (kidney stones). It is done using a device that beams a strong light (laser) on the kidney stones. This breaks the stones up into small pieces. These small pieces may leave your body when you pee (urinate) or may be taken out during the procedure.  You may need laser therapy if you have kidney stones that are painful or that are stopping you from being able to pee. Tell a health care provider about: Any allergies you have. All medicines you are taking, including vitamins, herbs, eye drops, creams, and over-the-counter medicines. Any problems you or family members have had with anesthesia. Any bleeding problems you have. Any surgeries you have had. Any medical conditions you have. Whether you are pregnant or may be pregnant. What are the risks? Your health care provider will talk with you about risks. These may include: Infection. Bleeding. Allergic reactions to medicines. Damage to: The part of your body that drains pee (urine) from the bladder (urethra). The bladder. The tube that connects the bladder to the kidneys (ureter). Urinary tract infection (UTI). Urethral stricture. This is when the urethra is narrowed by scarring. Trouble peeing. Blockage of the kidney. This may be caused by a piece of kidney stone. What happens before the procedure? When to stop eating and drinking Follow instructions from your provider about what you may eat and drink. These may include: 8 hours before the procedure Stop eating most foods. Do not eat meat, fried foods, or fatty foods. Eat only light foods, such as toast or crackers. All liquids are okay except energy drinks and alcohol . 6 hours before the procedure Stop eating. Drink only clear liquids, such as water, clear fruit juice, black coffee, plain tea, and sports drinks. Do not drink energy drinks or  alcohol . 2 hours before the procedure Stop drinking all liquids. You may be allowed to take medicines with small sips of water. If you do not follow your provider's instructions, your procedure may be delayed or canceled. Medicines Ask your provider about: Changing or stopping your regular medicines. These include any diabetes medicines or blood thinners you take. Taking medicines such as aspirin  and ibuprofen . These medicines can thin your blood. Do not take them unless your provider tells you to. Taking over-the-counter medicines, vitamins, herbs, and supplements. Tests You may have a physical exam before the procedure. You may also have tests done. These may include: Imaging tests. Blood or pee tests. Surgery safety Ask your provider: How your surgery site will be marked. What steps will be taken to help prevent infection. These steps may include: Removing hair at the surgery site. Washing skin with a soap that kills germs. Taking antibiotics. General instructions Do not use any products that contain nicotine  or tobacco for at least 4 weeks before the procedure. These products include cigarettes, chewing tobacco, and vaping devices, such as e-cigarettes. If you need help quitting, ask your provider. If you will be going home right after the procedure, plan to have a responsible adult: Take you home from the hospital or clinic. You will not be allowed to drive. Care for you for the time you are told. What happens during the procedure?  An IV will be inserted into one of your veins. You will be given: A sedative. This helps you relax. Anesthesia. This keeps you from feeling pain. It will make you fall asleep for surgery. A tool  with a camera on the end (ureteroscope) will be put into your urethra. It will be moved through your bladder to your kidney. It will send pictures to a screen in the operating room. This will show what parts of your kidney need to be treated. A tube will be  put through the ureteroscope. It will be moved into your kidney. The laser device will be put into your kidney through the tube. The laser will be used to break up the kidney stones. A tool with a tiny wire basket may be put through the tube into your kidney. This can help remove the small pieces of the kidney stone. A small mesh tube (stent) may be placed to allow your kidney to drain. The tube and ureteroscope will be taken out at the end of the surgery. The procedure may vary among providers and hospitals. What happens after the procedure? Your blood pressure, heart rate, breathing rate, and blood oxygen  level will be monitored until you leave the hospital or clinic. If you had a stent placed, it may have a string that will be secured to your skin. This helps your provider remove the stent. You may be given a strainer to collect any stone pieces that you pass in your pee. Your provider may have these tested. This information is not intended to replace advice given to you by your health care provider. Make sure you discuss any questions you have with your health care provider. Document Revised: 03/15/2022 Document Reviewed: 03/15/2022 Elsevier Patient Education  2024 [employer].

## 2024-03-10 NOTE — Telephone Encounter (Signed)
 Per Dr. Francisca, Patient is to be scheduled for Right Ureteroscopy with Laser Lithotripsy and Stent Placement   [name] was contacted and possible surgical dates were discussed, Monday October 6th, 2025 was agreed upon for surgery.   Patient was directed to call [phone] between 1-3pm the day before surgery to find out surgical arrival time.  Instructions were given not to eat or drink from midnight on the night before surgery and have a driver for the day of surgery. On the surgery day patient was instructed to enter through the [facility] entrance of [facility] report the Same Day Surgery desk.   Pre-Admit Testing will be in contact via phone to set up an interview with the anesthesia team to review your history and medications prior to surgery.   Reminder of this information was sent via Mail to the patient.

## 2024-03-11 ENCOUNTER — Telehealth: Payer: Self-pay

## 2024-03-11 NOTE — Telephone Encounter (Signed)
   Pre-operative Risk Assessment    Patient Name: Ian Mills  DOB: 06/20/1956 MRN: [id number]   Date of last office visit: 01/06/24 SHERI HAMMOCK, NP Date of next office visit: NONE   Request for Surgical Clearance    Procedure:  RIGHT URETEROSCOPY WITH LASER LITHOTRIPSY AND STENT PLACEMENT  Date of Surgery:  Clearance 05/03/24                                Surgeon:  DR CAMPBELL BURNET Surgeon's Group or Practice Name:  [facility] Phone number:  [phone] Fax number:  [phone]   Type of Clearance Requested:   - Medical  - Pharmacy:  Hold Aspirin  and Clopidogrel  (Plavix )     Type of Anesthesia:  General    Additional requests/questions:    Signed, Lucie DELENA Ku   03/11/2024, 11:07 AM

## 2024-03-11 NOTE — Telephone Encounter (Signed)
 UPDATE ON CLEARANCE REQUEST:   REQUEST STATES PT DOES NOT NEED TO HOLD ASA OR PLAVIX  FOR THE PROCEDURE.    SURGEON WILL NEED DEVICE CLEARANCE. I WILL SEND THIS TO DEVICE TEAM AS WELL.

## 2024-03-11 NOTE — Telephone Encounter (Addendum)
 Patient last seen in clinic on 11/2022 by Dr. Fernande, he is now a patient of Dr. Shaune.  Past due for follow up.   Forwarding to EP scheduling team to get set up for clearance before surgery.  Procedure is scheduled for 04/05/24.

## 2024-03-12 MED ORDER — METOPROLOL SUCCINATE ER 50 MG PO TB24: 25.0000 mg | ORAL_TABLET | Freq: Two times a day (BID) | ORAL | 3 refills | Status: DC

## 2024-03-12 NOTE — Telephone Encounter (Signed)
 Spoke to patient who device any HF symptoms such as leg swelling, shortness of breath or needing to sleep at elevated incline.   Patient advised per Dr. Kennyth to STOP Carvedilol  and START Metoprolol  XL 25 mg BID. Script sent to patient preferred pharmacy. Patient does have BP cuff at home and agrees to check daily and keep log.  Advised will need f/u w/ EP APP in 1 week.   Pt voiced understanding and appreciative of call.

## 2024-03-12 NOTE — Telephone Encounter (Signed)
 Call x1; lvmtcb to schedule appt w/ EP APP for pre-op clearance + 1 yr f/u.

## 2024-03-13 LAB — CULTURE, URINE COMPREHENSIVE

## 2024-03-15 NOTE — Telephone Encounter (Signed)
 Call x2; lvmtcb to schedule appt w/ EP APP for pre-op clearance + 1 yr f/u.

## 2024-03-18 DIAGNOSIS — F17210 Nicotine dependence, cigarettes, uncomplicated: Secondary | ICD-10-CM | POA: Diagnosis not present

## 2024-03-18 DIAGNOSIS — M5136 Other intervertebral disc degeneration, lumbar region with discogenic back pain only: Secondary | ICD-10-CM | POA: Diagnosis not present

## 2024-03-18 DIAGNOSIS — M47816 Spondylosis without myelopathy or radiculopathy, lumbar region: Secondary | ICD-10-CM | POA: Diagnosis not present

## 2024-03-18 DIAGNOSIS — Z09 Encounter for follow-up examination after completed treatment for conditions other than malignant neoplasm: Secondary | ICD-10-CM | POA: Diagnosis not present

## 2024-03-18 NOTE — Telephone Encounter (Signed)
 Called and spoke with the patient.  Advised him that he is still needing an EP appointment to follow up on recent med changes and to clear his device for  his upcoming procedure. I offered an EP APP appointment with Jodie, [place] in office tomorrow in [place], but the patient declined. He states he wants to be seen in the [place] office.  He lives in [place] and is currently scheduled to see Tylene Lunch, NP on 8/26 at 1005.  Advised the patient I will forward a message to EP scheduling to see if he can be worked in to see Dr. Kennyth or Suzann, NP that day.   The patient is aware to expect a call back. He voices understanding and is agreeable.

## 2024-03-18 NOTE — Telephone Encounter (Signed)
 Patient scheduled for 03/19/24 with  Suzann Riddle, NP.

## 2024-03-19 ENCOUNTER — Ambulatory Visit: Attending: Cardiology | Admitting: Cardiology

## 2024-03-19 VITALS — BP 110/60 | HR 64 | Ht 68.0 in | Wt 199.2 lb

## 2024-03-19 DIAGNOSIS — Z01818 Encounter for other preprocedural examination: Secondary | ICD-10-CM | POA: Diagnosis not present

## 2024-03-19 DIAGNOSIS — Z95810 Presence of automatic (implantable) cardiac defibrillator: Secondary | ICD-10-CM | POA: Diagnosis not present

## 2024-03-19 DIAGNOSIS — I5020 Unspecified systolic (congestive) heart failure: Secondary | ICD-10-CM | POA: Insufficient documentation

## 2024-03-19 DIAGNOSIS — I472 Ventricular tachycardia, unspecified: Secondary | ICD-10-CM | POA: Diagnosis not present

## 2024-03-19 LAB — CUP PACEART INCLINIC DEVICE CHECK
Date Time Interrogation Session: 20250822161528
HighPow Impedance: 84 Ohm
Implantable Lead Connection Status: 753985
Implantable Lead Implant Date: 20130503
Implantable Lead Location: 753860
Implantable Lead Model: 292
Implantable Lead Serial Number: 112568
Implantable Pulse Generator Implant Date: 20130503
Lead Channel Impedance Value: 636 Ohm
Lead Channel Pacing Threshold Amplitude: 0.5 V
Lead Channel Pacing Threshold Pulse Width: 0.5 ms
Lead Channel Sensing Intrinsic Amplitude: 17.2 mV
Lead Channel Setting Pacing Amplitude: 2.4 V
Lead Channel Setting Pacing Pulse Width: 0.5 ms
Lead Channel Setting Sensing Sensitivity: 0.6 mV
Pulse Gen Serial Number: 105232
Zone Setting Status: 755011

## 2024-03-19 MED ORDER — METOPROLOL SUCCINATE ER 25 MG PO TB24: 25.0000 mg | ORAL_TABLET | Freq: Every day | ORAL | 11 refills | Status: DC

## 2024-03-19 NOTE — Patient Instructions (Signed)
 Medication Instructions:  The current medical regimen is effective;  continue present plan and medications as directed. Please refer to the Current Medication list given to you today.   *If you need a refill on your cardiac medications before your next appointment, please call your pharmacy*  Lab Work: Your provider would like for you to have following labs drawn today BMET, MAG.   If you have labs (blood work) drawn today and your tests are completely normal, you will receive your results only by: MyChart Message (if you have MyChart) OR A paper copy in the mail If you have any lab test that is abnormal or we need to change your treatment, we will call you to review the results.  Follow-Up: At [facility], you and your health needs are our priority.  As part of our continuing mission to provide you with exceptional heart care, our providers are all part of one team.  This team includes your primary Cardiologist (physician) and Advanced Practice Providers or APPs (Physician Assistants and Nurse Practitioners) who all work together to provide you with the care you need, when you need it.  Your next appointment:   December with Dr.Gollan or Tylene Lunch, NP  Dr.Parker in 6-7 month   We recommend signing up for the patient portal called MyChart.  Sign up information is provided on this After Visit Summary.  MyChart is used to connect with patients for Virtual Visits (Telemedicine).  Patients are able to view lab/test results, encounter notes, upcoming appointments, etc.  Non-urgent messages can be sent to your provider as well.   To learn more about what you can do with MyChart, go to [email or web address].

## 2024-03-19 NOTE — Progress Notes (Signed)
 Electrophysiology Clinic Note    Date:  03/19/2024  Patient ID:  Mills, Ian Jan 01, 1956, MRN [id number] PCP:  No primary care provider on file.  Cardiologist:  Timothy Gollan, MD   Electrophysiologist:  Fonda Kitty, MD  Electrophysiology APP:  Jinnifer Montejano, NP (previous Dr. Fernande)  HF [facility] (PRN follow-up)   Discussed the use of AI scribe software for clinical note transcription with the patient, who gave verbal consent to proceed.   Patient Profile    Chief Complaint: ICD follow-up, pre-op evaluation  History of Present Illness: Ian Mills is a 68 y.o. male with PMH notable for VT s/p ICD, ICM, CAD s/p PCI, carotid artery disease, HFrEF, COPD, HTN, CKD - 3, tobacco use; seen today for Fonda Kitty, MD for cardiac clearance for cystoscopy.  He last saw Dr. Fernande 11/2022 where he was doing well without complaints. VT quiescent, so amiodarone  stopped. BP well controlled.   Device clinic alerted last week to multiple episodes of tachycardia, appeared to be atrially driven. His coreg  was stopped and toprol  25mg  BID started.   On follow-up today, he has had no further episodes of palpitations. He states the day prior to his palpitations was a normal day without any significant changes. No significant ETOH use or drug use, no missed doses of medications.  He is tolerating the switch from coreg  > toprol  well, is having some difficulty splitting the pills d/t small tablet size. He checks his BP regularly, readings are 100-110 systolic. He denies chest pain, chest pressure, SOB, edema.    Arrhythmia/Device History Bos Sci single chamber (GORE Lead), imp 11/2011; dx VT   AAD -  Amiodarone , stopped 2024 d/t lack of VT    ROS:  Please see the history of present illness. All other systems are reviewed and otherwise negative.    Physical Exam    VS:  BP 110/60 (BP Location: Left Arm, Patient Position: Sitting, Cuff Size: Normal)   Pulse 64   Ht 5' 8 (1.727 m)    Wt 199 lb 3.2 oz (90.4 kg)   SpO2 96%   BMI 30.29 kg/m  BMI: Body mass index is 30.29 kg/m.      Wt Readings from Last 3 Encounters:  03/19/24 199 lb 3.2 oz (90.4 kg)  03/10/24 191 lb 1.6 oz (86.7 kg)  01/06/24 193 lb 2 oz (87.6 kg)     GEN- The patient is well appearing, alert and oriented x 3 today.   Lungs- Clear to ausculation bilaterally, normal work of breathing.  Heart- Regular rate and rhythm, no murmurs, rubs or gallops Extremities- No peripheral edema, warm, dry Skin-  device pocket well-healed, no tethering   Device interrogation done today and reviewed by myself:  Battery 8mon until ERI Lead thresholds, impedence, sensing stable  Several tachy episodes falling into VT on 8/11, all appear atrially driven. Unable to assess further d/t single chamber ICD Increase time to detect VT in VT1 zone No further changes made today   Studies Reviewed   Previous EP, cardiology notes.    EKG is ordered. Personal review of EKG from today shows:    EKG Interpretation Date/Time:  Friday March 19 2024 14:22:13 EDT Ventricular Rate:  64 PR Interval:  168 QRS Duration:  94 QT Interval:  406 QTC Calculation: 418 R Axis:   -48  Text Interpretation: Normal sinus rhythm Low voltage QRS Left anterior fascicular block Confirmed by Quentina Fronek [phone]) on 03/19/2024 2:27:51 PM  TTE, 09/18/2022   TTE, 07/12/2021  1. LV apex not well visualized. apex appears akinetic. could not evaluate for thrombus presence due to image quality. limited echo with enhancing agent recommended.. Left ventricular ejection fraction, by estimation, is 25 to 30%. The left ventricle has severely decreased function. Left ventricular endocardial border not optimally defined to evaluate regional wall motion. Left ventricular diastolic parameters are consistent with Grade I diastolic dysfunction (impaired relaxation). There is akinesis of the left ventricular, apical segment.   2. Right ventricular  systolic function is normal. The right ventricular size is normal.   3. The mitral valve is normal in structure. No evidence of mitral valve regurgitation.   4. The aortic valve is tricuspid. Aortic valve regurgitation is not visualized.   5. The inferior vena cava is normal in size with greater than 50% respiratory variability, suggesting right atrial pressure of 3 mmHg.   Comparison(s): Echo performed at [facility]. LVEF 30%, Akinetic Apex,.    Assessment and Plan     #) VT s/p ICD #) SVT Stopped amiodarone  2024 d/t lack of VT VT quiescent off amiodarone  Several episodes of SVT over the course of a single 12-h period without inciting event.  Recently switched from coreg  > metop, tolerating well Continue to monitor via device  Update BMP, mag today  #) HFrEF Warm and euvolemic on exam GDMT - 10mg  farxiga , 25mg  toprol , 49-51 entresto , 25mg  spiro Diuretic - 20mg  torsemide  PRN   #) CAD Follows regularly with gen cards Continue ASA, atorva, plavix  as per gen cards   #) pre-op evaluation Planning for cystoscopy RCRI score = 3, so has an 11% estimated risk of perioperative major cardiac event No additional evaluation or workup is needed prior to procedure       Current medicines are reviewed at length with the patient today.   The patient does not have concerns regarding his medicines.  The following changes were made today:  none  Labs/ tests ordered today include:  Orders Placed This Encounter  Procedures   Basic metabolic panel with GFR   Magnesium    EKG 12-Lead     Disposition: Follow up with Dr. Kennyth ~7 months  to establish care with MD and discuss ICD gen change  Follow-up with gen cards MD or APP in 06/2024   Signed, Chantal Needle, NP  03/19/24  3:47 PM  Electrophysiology [facility]

## 2024-03-20 ENCOUNTER — Ambulatory Visit: Payer: Self-pay | Admitting: Cardiology

## 2024-03-20 LAB — BASIC METABOLIC PANEL WITH GFR
BUN/Creatinine Ratio: 17 (ref 10–24)
BUN: 21 mg/dL (ref 8–27)
CO2: 22 mmol/L (ref 20–29)
Calcium: 9.5 mg/dL (ref 8.6–10.2)
Chloride: 103 mmol/L (ref 96–106)
Creatinine, Ser: 1.26 mg/dL (ref 0.76–1.27)
Glucose: 78 mg/dL (ref 70–99)
Potassium: 4.9 mmol/L (ref 3.5–5.2)
Sodium: 139 mmol/L (ref 134–144)
eGFR: 61 mL/min/1.73 (ref >59)

## 2024-03-20 LAB — MAGNESIUM: Magnesium: 2.3 mg/dL (ref 1.6–2.3)

## 2024-03-23 ENCOUNTER — Ambulatory Visit: Admitting: Cardiology

## 2024-03-26 ENCOUNTER — Other Ambulatory Visit: Payer: Self-pay

## 2024-03-26 ENCOUNTER — Encounter
Admission: RE | Admit: 2024-03-26 | Discharge: 2024-03-26 | Disposition: A | Discharge status: Home / Self Care | Source: Ambulatory Visit | Attending: Urology | Admitting: Urology

## 2024-03-26 DIAGNOSIS — I5022 Chronic systolic (congestive) heart failure: Secondary | ICD-10-CM

## 2024-03-26 DIAGNOSIS — Z01818 Encounter for other preprocedural examination: Secondary | ICD-10-CM

## 2024-03-26 HISTORY — DX: Calculus of kidney: N20.0

## 2024-03-26 NOTE — Patient Instructions (Addendum)
 Your procedure is scheduled on: MONDAY 04/05/24 Report to the Registration Desk on the 1st floor of the [facility]. To find out your arrival time, please call [phone] between 1PM - 3PM on: FRIDAY 04/02/24 If your arrival time is 6:00 am, do not arrive before that time as the [facility] entrance doors do not open until 6:00 am.  REMEMBER: Instructions that are not followed completely may result in serious medical risk, up to and including death; or upon the discretion of your surgeon and anesthesiologist your surgery may need to be rescheduled.  Do not eat food after midnight the night before surgery.  No gum chewing or hard candies.  One week prior to surgery: Stop Anti-inflammatories (NSAIDS) such as Advil , Aleve, Ibuprofen , Motrin , Naproxen, Naprosyn and Aspirin  based products such as Excedrin, Goody's Powder, BC Powder.  Stop ANY OVER THE COUNTER supplements until after surgery.  Per Dr. Marval offfice you may continue taking clopidogrel  (PLAVIX ) and aspirin .  You may however, continue to take Tylenol  if needed for pain up until the day of surgery.  Continue taking all of your other prescription medications up until the day of surgery.  ON THE DAY OF SURGERY ONLY TAKE THESE MEDICATIONS WITH SIPS OF WATER:  ALPRAZolam  (XANAX )  atorvastatin  (LIPITOR)  ezetimibe  (ZETIA )  metoprolol  succinate (TOPROL -XL)  Tiotropium Bromide -Olodaterol (STIOLTO RESPIMAT )   Use inhalers on the day of surgery and bring to the hospital.  Fleets enema or bowel prep as directed.  No Alcohol  for 24 hours before or after surgery.  No Smoking including e-cigarettes for 24 hours before surgery.  No chewable tobacco products for at least 6 hours before surgery.  No nicotine  patches on the day of surgery.  Do not use any recreational drugs for at least a week (preferably 2 weeks) before your surgery.  Please be advised that the combination of cocaine and anesthesia may have negative outcomes,  up to and including death. If you test positive for cocaine, your surgery will be cancelled.  On the morning of surgery brush your teeth with toothpaste and water, you may rinse your mouth with mouthwash if you wish. Do not swallow any toothpaste or mouthwash.  Do not wear jewelry, make-up, hairpins, clips or nail polish.  For welded (permanent) jewelry: bracelets, anklets, waist bands, etc.  Please have this removed prior to surgery.  If it is not removed, there is a chance that hospital personnel will need to cut it off on the day of surgery.  Do not wear lotions, powders, or perfumes.   Do not shave body hair from the neck down 48 hours before surgery.  Contact lenses, hearing aids and dentures may not be worn into surgery.  Do not bring valuables to the hospital. [facility] is not responsible for any missing/lost belongings or valuables.   Bring your C-PAP to the hospital in case you may have to spend the night.   Notify your doctor if there is any change in your medical condition (cold, fever, infection).  Wear comfortable clothing (specific to your surgery type) to the hospital.  After surgery, you can help prevent lung complications by doing breathing exercises.  Take deep breaths and cough every 1-2 hours. Your doctor may order a device called an Incentive Spirometer to help you take deep breaths. When coughing or sneezing, hold a pillow firmly against your incision with both hands. This is called "splinting." Doing this helps protect your incision. It also decreases belly discomfort.  If you are being  admitted to the hospital overnight, leave your suitcase in the car. After surgery it may be brought to your room.  In case of increased patient census, it may be necessary for you, the patient, to continue your postoperative care in the Same Day Surgery department.  If you are being discharged the day of surgery, you will not be allowed to drive home. You will need a  responsible individual to drive you home and stay with you for 24 hours after surgery.   If you are taking public transportation, you will need to have a responsible individual with you.  Please call the Pre-admissions Testing Dept. at [phone] if you have any questions about these instructions.  Surgery Visitation Policy:  Patients having surgery or a procedure may have two visitors.  Children under the age of 34 must have an adult with them who is not the patient.  Merchandiser, retail to address health-related social needs:  [email or web address]

## 2024-04-02 ENCOUNTER — Encounter: Payer: Self-pay | Admitting: Cardiology

## 2024-04-02 ENCOUNTER — Encounter: Payer: Self-pay | Admitting: Urology

## 2024-04-02 NOTE — Progress Notes (Signed)
 Perioperative / Anesthesia Services  Pre-Admission Testing Clinical Review / Pre-Operative Anesthesia Consult  Date: 04/02/24  PATIENT DEMOGRAPHICS: Name: Ian Mills DOB: 1955-09-17 MRN:   [id number]  Note: Available PAT nursing documentation and vital signs have been reviewed. Clinical nursing staff has updated patient's PMH/PSHx, current medication list, and drug allergies/intolerances to ensure complete and comprehensive history available to assist care teams in MDM as it pertains to the aforementioned surgical procedure and anticipated anesthetic course. Extensive review of available clinical information personally performed. [place] PMH and PSHx updated with any diagnoses/procedures that  may have been inadvertently omitted during his intake with the pre-admission testing department's nursing staff.  PLANNED SURGICAL PROCEDURE(S):   Case: [id number] Date/Time: 04/05/24 1408   Procedure: CYSTOSCOPY/URETEROSCOPY/HOLMIUM LASER/STENT PLACEMENT (Right)   Anesthesia type: General   Diagnosis: Right nephrolithiasis [N20.0]   Pre-op diagnosis: Right Nephrolithiasis   Location: [facility] OR ROOM 10 / [facility]   Surgeons: Francisca Redell BROCKS, MD        CLINICAL DISCUSSION: Ian Mills is a 68 y.o. male who is submitted for pre-surgical anesthesia review and clearance prior to him undergoing the above procedure.Patient is a Former Smoker (21 pack years; quit 10/2019). Pertinent PMH includes: CAD, MI x 3, NSVT (s/p AICD), ischemic cardiomyopathy, HFrEF, LAFB, BILATERAL carotid artery disease, aortic atherosclerosis, HTN, HLD, pulmonary hypertension, COPD, nephrolithiasis, anxiety (on BZO).   Patient is followed by cardiology Florestine, MD) and electrophysiology Anice, MD).  He was last seen in the cardiology clinic on 03/19/2024; notes reviewed. At the time of his clinic visit, patient doing well overall from a cardiovascular perspective. Patient denied any chest pain,  shortness of breath, PND, orthopnea, palpitations, significant peripheral edema, weakness, fatigue, vertiginous symptoms, or presyncope/syncope. Patient with a past medical history significant for cardiovascular diagnoses. Documented physical exam was grossly benign, providing no evidence of acute exacerbation and/or decompensation of the patient's known cardiovascular conditions.  Patient suffered an anterior wall MI on 07/04/1991. LVEF at that time was 29%. Catheterization revealed 100% occlusion of the mid LAD.  PTCA was performed resulting in <25% residual stenosis.   Patient suffered a lateral wall MI on 06/29/1997. Diagnostic left heart catheterization was performed revealing an LVEF of 44%.  There was multivessel CAD; 75% RPDA and 95% OM3.  PCI was performed placing a 3.0 x 18 mm Duet stent to the OM3.   Repeat diagnostic left heart catheterization was performed on 07/12/1998.  LVEF depressed at 36%.  Patient with multivessel CAD; 95% D1 and 75% RPDA.  Subsequent PCI was performed placing a 2.5 x 8 mm Duet stent to D1 as well as a 2.5 x 13 mm Duet stent to the RPDA resulting in <25% residual stenosis and TIMI-3 flow.   Patient suffered an NSTEMI on 11/27/2011.  Diagnostic left heart catheterization revealed a high percent occlusion of the mid LAD.  Intervention was deferred opting for medical management.   Patient suffered an episode of ventricular tachycardia arrest on 12/01/2011.  ACLS measures taken and ROSC achieved.  Patient subsequently underwent placement of a Environmental manager AICD. Additionally, he is currently on antiarrythmic (amiodarone ) + beta blocker (carvedilol ) therapies.  Implanted cardiac device is regularly interrogated by patient's primary electrophysiology team.  Most recent interrogation was on 03/19/2024, at which time device was noted to be functioning appropriately.    Last diagnostic left heart catheterization was performed on 08/30/2019 demonstrating severe multivessel  CAD; 20% proximal mid RCA, 30% proximal RCA, 20% proximal and mid LAD,  60% D1, 60% ostial to proximal LCx (progressed), and 40% mid LCx. There was in-stent restenosis of the previously placed RCA stent (mild) and LCx stent (moderate).   Most recent myocardial perfusion imaging study was performed on 09/18/2022.  Pre-stress ECG shows sinus rhythm with PVCs.  Q waves were noted in V1-V5.  T wave inversion noted and aVL with ST elevation of 0.5 mm in lead V2 with the aforementioned Q waves noted.  There was no evidence of inducible myocardial ischemia or arrhythmia noted during the study.  Most recent TTE was performed on 09/18/2022 revealing a severely reduced left ventricular systolic function with EF of 20-25%.  Only the basal ventricle was moving.  The entire mid ventricle and apex were severely hypokinetic to akinetic.  Right ventricular size and function normal.  There was trivial mitral valve regurgitation.  Mild aortic valve sclerosis was noted.  PASP measured at 31 mmHg consistent with pulmonary hypertension.  Thoracic aorta poorly visualized.  Patient with a history of known carotid artery disease.  Most recent carotid Doppler study performed on 11/04/2023 revealed a 1-39% % stenosis of the patient's BILATERAL carotid arteries.  Vertebral arteries demonstrated antegrade flow.  There were normal flow hemodynamics seen in the subclavian arteries.  Ischemic cardiomyopathy and resulting HFrEF being managed on GDMT including (beta-blocker (metoprolol  succinate), diuretic (spironolactone  + torsemide ), and ARB/ARNI (Entresto ) therapies.  Has had no further palpitations or episodes of VT requiring shock.  Amiodarone  was discontinued in 2024.  Implanted AICD reaching EOL in 8 months.  Discussed need for follow-up with EP physician to discuss generator change out.  Patient is on atorvastatin  + ezetimibe  for his HLD diagnosis and further ASCVD prevention.  Patient is also on an SGLT2i (dapagliflozin ) for added  cardiovascular and renovascular protection.  Patient is not diabetic.  He does not have an OSAH diagnosis. Patient is able to complete all of his  ADL/IADLs without cardiovascular limitation.  Per the DASI, patient is able to achieve at least 4 METS of physical activity without experiencing any significant degree of angina/anginal equivalent symptoms. No changes were made to his medication regimen during his visit with cardiology.  Patient scheduled to follow-up with outpatient cardiology in 7 months or sooner if needed.  ZEDEKIAH HINDERMAN is scheduled for an elective CYSTOSCOPY/URETEROSCOPY/HOLMIUM LASER/STENT PLACEMENT (Right) on 04/05/2024 with Dr. [name], MD. Given patient's past medical history significant for cardiovascular diagnoses, presurgical cardiac clearance was sought by the PAT team. Per cardiology, RCRI score = 3, so has an 11% estimated risk of perioperative major cardiac event. Based ACC/AHA guidelines, the patient's past medical history, and the amount of time since his last clinic visit, this patient would be at an overall ACCEPTABLE risk for the planned procedure without further cardiovascular testing or intervention at this time.   Again, this patient is on daily DAPT therapy. Given that patient's past medical history is significant for cardiovascular diagnoses, including but not limited to CAD, urology has cleared patient to continue his daily DAPT medications throughout his perioperative course. He will be asked to hold his normal dose on the day of his procedure only. Patient has been updated on these directives from his specialty care providers by the PAT team.  Patient denies previous perioperative complications with anesthesia in the past. In review his EMR, it is noted that patient underwent a MAC anesthetic course at [facility] ([facility]) in 07/2023 without documented complications.   MOST RECENT VITAL SIGNS:    03/19/2024  2:23 PM 03/10/2024    1:01 PM  01/06/2024    1:34 PM  Vitals with BMI  Height 5' 8 5' 9 5' 9  Weight 199 lbs 3 oz 191 lbs 2 oz 193 lbs 2 oz  BMI 30.3 28.21 28.51  Systolic 110 125 899  Diastolic 60 82 60  Pulse 64 64 70   PROVIDERS/SPECIALISTS: NOTE: Primary physician provider listed below. Patient may have been seen by APP or partner within same practice.   PROVIDER ROLE / SPECIALTY LAST [place] Francisca Redell JAYSON, MD Urology (Surgeon) 03/10/2024  Patient, No Pcp Per Primary Care Provider ???  Perla Lye, MD Cardiology 01/06/2024  Kennyth Chew, MD Electrophysiology 03/19/2024   ALLERGIES: Allergies  Allergen Reactions   Nitroglycerin  Nausea Only and Other (See Comments)    Per patient causes severe  headache    CURRENT HOME MEDICATIONS: No current facility-administered medications for this encounter.    ALPRAZolam  (XANAX ) 0.5 MG tablet   aspirin  EC 81 MG tablet   atorvastatin  (LIPITOR) 80 MG tablet   clopidogrel  (PLAVIX ) 75 MG tablet   dapagliflozin  propanediol (FARXIGA ) 10 MG TABS tablet   ezetimibe  (ZETIA ) 10 MG tablet   metoprolol  succinate (TOPROL -XL) 50 MG 24 hr tablet   Potassium Citrate  15 MEQ (1620 MG) TBCR   sacubitril -valsartan  (ENTRESTO ) 49-51 MG   spironolactone  (ALDACTONE ) 25 MG tablet   Tiotropium Bromide -Olodaterol (STIOLTO RESPIMAT ) 2.5-2.5 MCG/ACT AERS   torsemide  (DEMADEX ) 20 MG tablet   metoprolol  succinate (TOPROL  XL) 25 MG 24 hr tablet   sildenafil  (VIAGRA ) 100 MG tablet   HISTORY: Past Medical History:  Diagnosis Date   Acute anterior wall MI (HCC) 07/04/1991   a.) LHC: EF 29%; 100% mLAD -> PTCA performed   AICD (automatic cardioverter/defibrillator) present 12/01/2011   a.) single chamber [employer] AICD  (SN: [phone])   Anxiety    Aortic atherosclerosis (HCC)    Baker's cyst 06/10/2019   5.3 cm 01/2018 noted US  no repair as of 06/10/2019      Bilateral carotid artery disease (HCC)    Cellulitis of left foot 05/10/2022   COPD (chronic obstructive pulmonary  disease) (HCC)    Coronary artery disease    a.) 3 stents --> 3x18 mm Duet to OM3, 2.5x18 mm Duet to D1, 2.5x13 mm Duet to RPDA. b. 06/2013 PCI: CTO LAD, patent LAD stent, RCA 70p (FFR 0.74--> DES), 49m (DES), RPDA 50%, EF 20%; c. 08/2019 Cath: LM nl, LAD 100p/m, D1 60, D2 patent stent, LCX 60ost/p, 57m ISR, RCA 30p, 48m ISR.   Dizziness 03/30/2015   Family history of lung cancer    HFrEF (heart failure with reduced ejection fraction) (HCC)    a. 05/2014 Echo: EF 25-30%; b. 01/2018 Echo ([facility]): EF 30%; c. 06/2021 Echo: EF 25-30%, apical AK, GrI DD, nl RV fxn.   HLD (hyperlipidemia)    HTN (hypertension)    Insomnia 06/01/2014   Ischemic cardiomyopathy    a. 05/2014 Echo: EF 25-30%; b. 01/2018 Echo ([facility]): EF 30%, c. 06/2021 Echo: EF 25-30%.   LAFB (left anterior fascicular block)    Lateral wall myocardial infarction (HCC) 06/29/1997   a.) LHC: EF 44%; 75% RPDA and 95% OM3; PCI performed and a 3.0 x 18 mm Duet stent placed to OM3   Leukocytosis 08/16/2022   Long term current use of clopidogrel     Long-term use of aspirin  therapy    Nephrolithiasis    NSTEMI (non-ST elevated myocardial infarction) (HCC) 11/27/2011   a.) LHC: 100% mLAD;  intervention deferred opting for medical management.   Personal history of colonic polyps    Pneumonia 08/2018   Pulmonary hypertension (HCC)    Sacroiliac pain 07/31/2016   Sepsis due to group A Streptococcus with acute renal failure and septic shock (HCC) 2023   Syncope and collapse    Tobacco abuse 06/13/2016   Ventricular tachycardia (HCC) 12/01/2011   a.) VT arrest; ROSC achieved --> [employer] AICD placed.   Past Surgical History:  Procedure Laterality Date   APPENDECTOMY N/A    CARDIAC DEFIBRILLATOR PLACEMENT N/A 12/01/2011   CATARACT EXTRACTION W/PHACO Left 08/06/2023   Procedure: CATARACT EXTRACTION PHACO AND INTRAOCULAR LENS PLACEMENT (IOC) LEFT 4.61, 00:28.3;  Surgeon: Mittie Gaskin, MD;  Location: [facility];  Service:  Ophthalmology;  Laterality: Left;   COLONOSCOPY WITH PROPOFOL  N/A 12/22/2020   Procedure: COLONOSCOPY WITH PROPOFOL ;  Surgeon: Therisa Bi, MD;  Location: [facility] ENDOSCOPY;  Service: Gastroenterology;  Laterality: N/A;   CORONARY ANGIOPLASTY Left 07/05/1991   Procedure: CORONARY ANGIOPLASTY; Location: [facility]; Surgeon: Miquel Ellen, MD   CORONARY ANGIOPLASTY Left 08/06/1991   Procedure: CORONARY ANGIOPLASTY; Location: [facility]; Surgeon: Steffan Score, MD   CORONARY ANGIOPLASTY Left 08/09/1991   Procedure: CORONARY ANGIOPLASTY; Location: [facility]; Surgeon: Steffan Score, MD   CORONARY ANGIOPLASTY Left 10/28/1991   Procedure: CORONARY ANGIOPLASTY; Location: [facility]; Surgeon: Steffan Score, MD   CORONARY ANGIOPLASTY Left 11/02/1991   Procedure: CORONARY ANGIOPLASTY; Location: [facility]; Surgeon: Steffan Score, MD   CORONARY ANGIOPLASTY Left 01/10/1992   Procedure: CORONARY ANGIOPLASTY; Location: [facility]; Surgeon: Charlie Ross, MD   CORONARY ANGIOPLASTY Left 09/26/1993   Procedure: CORONARY ANGIOPLASTY; Location: [facility]; Surgeon: Charlie Hasten, MD   CORONARY ANGIOPLASTY Left 09/11/2006   Procedure: CORONARY ANGIOPLASTY; Location: [facility]   CORONARY ANGIOPLASTY Left 01/17/2010   Procedure: CORONARY ANGIOPLASTY; Location: [facility]; Surgeon: Prentice Risk, MD   CORONARY ANGIOPLASTY Left 11/27/2011   Procedure: CORONARY ANGIOPLASTY; Location: [facility]; Surgeon: Prentice Flatten, MD   CORONARY ANGIOPLASTY WITH STENT PLACEMENT Left 06/29/1997   Procedure: CORONARY ANGIOPLASTY WITH STENT PLACEMENT (3.0 x 18 mm Diet stent to OM3); Location: [facility]; Surgeon: Lionel Zidar, MD   CORONARY ANGIOPLASTY WITH STENT PLACEMENT Left 07/12/1998   Procedure: CORONARY ANGIOPLASTY WITH STENT PLACEMENT (2.8 x 8 mm Duet stent to D1, 2.5 x 13 mm Duet stent to RPDA); Location: [facility]   GRAFT APPLICATION Left 08/24/2021   Procedure: GRAFT APPLICATION;  Surgeon: Tye Millet, DO;  Location: [facility] ORS;  Service: General;  Laterality: Left;   LEFT HEART CATH AND  CORONARY ANGIOGRAPHY Left 08/30/2019   Procedure: LEFT HEART CATH AND CORONARY ANGIOGRAPHY;  Surgeon: Darron Deatrice LABOR, MD;  Location: [facility] INVASIVE CV LAB;  Service: Cardiovascular;  Laterality: Left;   PROSTATE SURGERY     urethra grew around prostate    TUMOR REMOVAL  2001   chest thymic cyst; behind lungs [facility] benign    Family History  Problem Relation Age of Onset   Cancer - Lung Mother    Hypertension Mother    Heart attack Mother    Cancer Father        larynx   Dementia Maternal Grandmother    Heart attack Paternal Grandfather    Colon cancer Neg Hx    Stomach cancer Neg Hx    Pancreatic cancer Neg Hx    Social History   Tobacco Use   Smoking status: Some Days    Current packs/day: 0.00    Average packs/day: 0.5 packs/day for 42.0 years (21.0 ttl pk-yrs)    Types: Cigarettes  Start date: 11/09/1977    Last attempt to quit: 11/10/2019    Years since quitting: 4.3   Smokeless tobacco: Never  Substance Use Topics   Alcohol  use: Yes    Alcohol /week: 1.0 standard drink of alcohol     Types: 1 Cans of beer per week    Comment: Rare   LABS:  Lab Results  Component Value Date   WBC 8.2 06/30/2023   HGB 16.0 06/30/2023   HCT 48.7 06/30/2023   MCV 91 06/30/2023   PLT 185 06/30/2023   Lab Results  Component Value Date   NA 139 03/19/2024   CL 103 03/19/2024   K 4.9 03/19/2024   CO2 22 03/19/2024   BUN 21 03/19/2024   CREATININE 1.26 03/19/2024   EGFR 61 03/19/2024   CALCIUM  9.5 03/19/2024   PHOS 3.3 07/31/2021   ALBUMIN 4.1 06/25/2022   GLUCOSE 78 03/19/2024    ECG: Date: 03/19/2024  Time ECG obtained: 1422 PM Rate: 64 bpm Rhythm: normal sinus; LAFB Axis (leads I and aVF): normal Intervals: PR 168 ms. QRS 94 ms. QTc 418 ms. ST segment and T wave changes: No evidence of acute T wave abnormalities or significant ST segment elevation or depression.  Evidence of a possible, age undetermined, prior infarct:  No Comparison: Similar to previous tracing  obtained on 01/06/2024   IMAGING / PROCEDURES: CT RENAL STONE STUDY performed on 01/26/2024 The largest right renal pelvic stone is decreased in size from 3.4 cm to 2.7 cm. A smaller right lower pole stone is decreased in size from 10 mm to 6 mm  No overt hydronephrosis.  VAS US  CAROTID performed on 11/04/2023 Velocities in the right ICA are consistent with a 1-39% stenosis. Non-hemodynamically significant plaque <50% noted in the CCA. The ECA appears <50% stenosed.  Velocities in the left ICA are consistent with a 1-39% stenosis. Non-hemodynamically significant plaque <50% noted in the CCA. The ECA appears <50% stenosed.  Bilateral vertebral arteries demonstrate antegrade flow.  Normal flow hemodynamics were seen in bilateral subclavian arteries.   CT CHEST LUNG CA SCREEN LOW DOSE W/O CM performed on 09/09/2023 Lung-RADS 2, benign appearance or behavior. Continue annual screening with low-dose chest CT without contrast in 12 months. Aortic Atherosclerosis  Emphysema   Minimal motion degradation.  MYOCARDIAL PERFUSION IMAGING STUDY (LEXISCAN ) performed on 09/18/2022 Resting EKG showed sinus rhythm at a ventricular rate of 65 bpm Q waves noted in V1-V5 Isolated frequent PVCs noted T wave inversions noted in lead aVL ST elevation of 0.5 mm in lead V2 with the Q waves noted EKG during the infusion of Lexiscan  shortly after showed sinus rhythm with isolated PVCs.  There were no changes noted from baseline. No evidence of inducible ischemia based on the ECG.  TRANSTHORACIC ECHOCARDIOGRAM performed on 09/18/2022 Severely reduced left ventricular systolic function with an EF of 20-25%. Only the basal ventricle noted to be moving.  The entire ventricle and apex are severely hypokinetic to akinetic Right ventricular size and function normal. Mild aortic valve sclerosis Trivial TR PASP = 31 mmHg Aortic root, ascending aorta, and ascending aorta are poorly visualized   LEFT HEART  CATHETERIZATION AND CORONARY ANGIOGRAPHY performed on 08/30/2019 Significant underlying CAD 20% proximal to mid RCA stenosis 30% proximal RCA stenosis 100% proximal to mid LAD stenosis 60% D1 stenosis 60% ostial to proximal LCx stenosis 40% mid LCx stenosis ISR of previously placed stents Patient D1 stent Patent RCA stents with mild ISR Patent LCx stent with moderate ISR Left ventricular  angiography was not performed due to chronic kidney disease Moderately elevated LVEDP between 25-30 mmHg Recommendations: Continue aggressive medical therapy No indication for revascularization The patient is in volume overload and will stop IV hydration at this time.  Continue furosemide .     IMPRESSION AND PLAN: Ian Mills has been referred for pre-anesthesia review and clearance prior to him undergoing the planned anesthetic and procedural courses. Available labs, pertinent testing, and imaging results were personally reviewed by me in preparation for upcoming operative/procedural course. [facility] medical record has been updated following extensive record review and patient interview with PAT staff.   This patient has been appropriately cleared by cardiology with an overall ACCEPTABLE risk of patient experiencing significant perioperative cardiovascular complications. Based on clinical review performed today (04/02/24), barring any significant acute changes in the patient's overall condition, it is anticipated that he will be able to proceed with the planned surgical intervention. Any acute changes in clinical condition may necessitate his procedure being postponed and/or cancelled. Patient will meet with anesthesia team (MD and/or CRNA) on the day of his procedure for preoperative evaluation/assessment. Questions regarding anesthetic course will be fielded at that time.   Pre-surgical instructions were reviewed with the patient during his PAT appointment, and questions were fielded to  satisfaction by PAT clinical staff. He has been instructed on which medications that he will need to hold prior to surgery, as well as the ones that have been deemed safe/appropriate to take on the day of his procedure. As part of the general education provided by PAT, patient made aware both verbally and in writing, that he would need to abstain from the use of any illegal substances during his perioperative course. He was advised that failure to follow the provided instructions could necessitate case cancellation or result in serious perioperative complications up to and including death. Patient encouraged to contact PAT and/or his surgeon's office to discuss any questions or concerns that may arise prior to surgery; verbalized understanding.   Dorise Pereyra, MSN, APRN, FNP-C, CEN [facility]  Perioperative Services Nurse Practitioner Phone: [phone] Fax: [phone] 04/02/24 4:33 PM  NOTE: This note has been prepared using Dragon dictation software. Despite my best ability to proofread, there is always the potential that unintentional transcriptional errors may still occur from this process.

## 2024-04-02 NOTE — Progress Notes (Signed)
 PERIOPERATIVE PRESCRIPTION FOR IMPLANTED CARDIAC DEVICE PROGRAMMING  Patient Information: Name:  Ian Mills  DOB:  January 31, 1956  MRN:  [id number]   Planned Procedure: CYSTOSCOPY/URETEROSCOPY/HOLMIUM LASER/STENT PLACEMENT   Surgeon:  Dr. Redell Burnet, MD  Requesting device clearance: Dorise Pereyra, FNP-C  Date of Procedure:  04/05/2024  Cautery will be used.   Device Information:  Clinic EP Physician: Fonda Kitty, MD  Device Type:  Defibrillator Manufacturer and Phone #:  [employer]: [phone] Pacemaker Dependent?:  No. Date of Last Device Check:  03/19/2024  Normal Device Function?:  Yes.    Electrophysiologist's Recommendations:  Have magnet available. Provide continuous ECG monitoring when magnet is used or reprogramming is to be performed.  Procedure should not interfere with device function.  No device programming or magnet placement needed.  Per Device Clinic [address], [place], [place]  4:03 PM 04/02/2024

## 2024-04-04 ENCOUNTER — Ambulatory Visit: Payer: Self-pay | Admitting: Cardiology

## 2024-04-05 ENCOUNTER — Ambulatory Visit

## 2024-04-05 ENCOUNTER — Encounter: Payer: Self-pay | Admitting: Urology

## 2024-04-05 ENCOUNTER — Ambulatory Visit: Payer: Self-pay | Admitting: Urgent Care

## 2024-04-05 ENCOUNTER — Other Ambulatory Visit: Payer: Self-pay

## 2024-04-05 ENCOUNTER — Encounter
Admission: RE | Disposition: A | Discharge status: Home / Self Care | Payer: Self-pay | Source: Home / Self Care | Attending: Urology

## 2024-04-05 ENCOUNTER — Ambulatory Visit
Admission: RE | Admit: 2024-04-05 | Discharge: 2024-04-05 | Disposition: A | Discharge status: Home / Self Care | Attending: Urology | Admitting: Urology

## 2024-04-05 DIAGNOSIS — I5022 Chronic systolic (congestive) heart failure: Secondary | ICD-10-CM | POA: Insufficient documentation

## 2024-04-05 DIAGNOSIS — N200 Calculus of kidney: Secondary | ICD-10-CM | POA: Insufficient documentation

## 2024-04-05 DIAGNOSIS — Z955 Presence of coronary angioplasty implant and graft: Secondary | ICD-10-CM | POA: Diagnosis not present

## 2024-04-05 DIAGNOSIS — F419 Anxiety disorder, unspecified: Secondary | ICD-10-CM | POA: Diagnosis not present

## 2024-04-05 DIAGNOSIS — J449 Chronic obstructive pulmonary disease, unspecified: Secondary | ICD-10-CM | POA: Insufficient documentation

## 2024-04-05 DIAGNOSIS — I252 Old myocardial infarction: Secondary | ICD-10-CM | POA: Diagnosis not present

## 2024-04-05 DIAGNOSIS — E785 Hyperlipidemia, unspecified: Secondary | ICD-10-CM | POA: Insufficient documentation

## 2024-04-05 DIAGNOSIS — Z79899 Other long term (current) drug therapy: Secondary | ICD-10-CM | POA: Insufficient documentation

## 2024-04-05 DIAGNOSIS — Z95810 Presence of automatic (implantable) cardiac defibrillator: Secondary | ICD-10-CM | POA: Insufficient documentation

## 2024-04-05 DIAGNOSIS — F32A Depression, unspecified: Secondary | ICD-10-CM | POA: Insufficient documentation

## 2024-04-05 DIAGNOSIS — I255 Ischemic cardiomyopathy: Secondary | ICD-10-CM | POA: Diagnosis not present

## 2024-04-05 DIAGNOSIS — Z01818 Encounter for other preprocedural examination: Secondary | ICD-10-CM

## 2024-04-05 DIAGNOSIS — I110 Hypertensive heart disease with heart failure: Secondary | ICD-10-CM | POA: Diagnosis not present

## 2024-04-05 DIAGNOSIS — I2510 Atherosclerotic heart disease of native coronary artery without angina pectoris: Secondary | ICD-10-CM | POA: Diagnosis not present

## 2024-04-05 HISTORY — DX: Long term (current) use of antithrombotics/antiplatelets: Z79.02

## 2024-04-05 HISTORY — DX: Disorder of arteries and arterioles, unspecified: I77.9

## 2024-04-05 HISTORY — DX: Long term (current) use of aspirin: Z79.82

## 2024-04-05 HISTORY — DX: Pulmonary hypertension, unspecified: I27.20

## 2024-04-05 HISTORY — DX: Left anterior fascicular block: I44.4

## 2024-04-05 HISTORY — PX: CYSTOSCOPY/URETEROSCOPY/HOLMIUM LASER/STENT PLACEMENT: SHX6546

## 2024-04-05 SURGERY — CYSTOSCOPY/URETEROSCOPY/HOLMIUM LASER/STENT PLACEMENT
Anesthesia: General | Site: Ureter | Laterality: Right

## 2024-04-05 MED ORDER — ORAL CARE MOUTH RINSE: 15.0000 mL | Freq: Once | OROMUCOSAL | Status: DC

## 2024-04-05 MED ORDER — PROPOFOL 10 MG/ML IV BOLUS
INTRAVENOUS | Status: DC | PRN
  Administered 2024-04-05: 150 mg via INTRAVENOUS

## 2024-04-05 MED ORDER — TRAMADOL HCL 50 MG PO TABS: 25.0000 mg | ORAL_TABLET | Freq: Four times a day (QID) | ORAL | 0 refills | Status: AC | PRN

## 2024-04-05 MED ORDER — CEFAZOLIN SODIUM-DEXTROSE 2-4 GM/100ML-% IV SOLN
INTRAVENOUS | Status: AC
  Filled 2024-04-05: qty 100

## 2024-04-05 MED ORDER — IOHEXOL 180 MG/ML  SOLN
INTRAMUSCULAR | Status: DC | PRN
  Administered 2024-04-05: 10 mL

## 2024-04-05 MED ORDER — FENTANYL CITRATE (PF) 100 MCG/2ML IJ SOLN
INTRAMUSCULAR | Status: AC
  Filled 2024-04-05: qty 2

## 2024-04-05 MED ORDER — CHLORHEXIDINE GLUCONATE 0.12 % MT SOLN: 15.0000 mL | Freq: Once | OROMUCOSAL | Status: DC

## 2024-04-05 MED ORDER — SUGAMMADEX SODIUM 200 MG/2ML IV SOLN
INTRAVENOUS | Status: DC | PRN
  Administered 2024-04-05: 200 mg via INTRAVENOUS

## 2024-04-05 MED ORDER — LIDOCAINE HCL (CARDIAC) PF 100 MG/5ML IV SOSY
PREFILLED_SYRINGE | INTRAVENOUS | Status: DC | PRN
  Administered 2024-04-05: 100 mg via INTRAVENOUS

## 2024-04-05 MED ORDER — OXYMETAZOLINE HCL 0.05 % NA SOLN
NASAL | Status: DC | PRN
  Administered 2024-04-05: 2 via NASAL

## 2024-04-05 MED ORDER — ALBUTEROL SULFATE HFA 108 (90 BASE) MCG/ACT IN AERS
INHALATION_SPRAY | RESPIRATORY_TRACT | Status: DC | PRN
  Administered 2024-04-05: 2 via RESPIRATORY_TRACT

## 2024-04-05 MED ORDER — MIDAZOLAM HCL 2 MG/2ML IJ SOLN
INTRAMUSCULAR | Status: DC | PRN
  Administered 2024-04-05: 2 mg via INTRAVENOUS

## 2024-04-05 MED ORDER — SUCCINYLCHOLINE CHLORIDE 200 MG/10ML IV SOSY
PREFILLED_SYRINGE | INTRAVENOUS | Status: DC | PRN
  Administered 2024-04-05: 80 mg via INTRAVENOUS

## 2024-04-05 MED ORDER — DEXAMETHASONE SODIUM PHOSPHATE 10 MG/ML IJ SOLN
INTRAMUSCULAR | Status: DC | PRN
  Administered 2024-04-05: 10 mg via INTRAVENOUS

## 2024-04-05 MED ORDER — ROCURONIUM BROMIDE 100 MG/10ML IV SOLN
INTRAVENOUS | Status: DC | PRN
  Administered 2024-04-05: 30 mg via INTRAVENOUS

## 2024-04-05 MED ORDER — PHENYLEPHRINE 80 MCG/ML (10ML) SYRINGE FOR IV PUSH (FOR BLOOD PRESSURE SUPPORT)
PREFILLED_SYRINGE | INTRAVENOUS | Status: DC | PRN
  Administered 2024-04-05: 80 ug via INTRAVENOUS

## 2024-04-05 MED ORDER — STERILE WATER FOR IRRIGATION IR SOLN
Status: DC | PRN
  Administered 2024-04-05: 500 mL

## 2024-04-05 MED ORDER — ACETAMINOPHEN 10 MG/ML IV SOLN
INTRAVENOUS | Status: DC | PRN
  Administered 2024-04-05: 1000 mg via INTRAVENOUS

## 2024-04-05 MED ORDER — ONDANSETRON HCL 4 MG/2ML IJ SOLN
INTRAMUSCULAR | Status: DC | PRN
  Administered 2024-04-05 (×2): 4 mg via INTRAVENOUS

## 2024-04-05 MED ORDER — LACTATED RINGERS IV SOLN: INTRAVENOUS | Status: DC

## 2024-04-05 MED ORDER — CEFAZOLIN SODIUM-DEXTROSE 2-4 GM/100ML-% IV SOLN
2.0000 g | INTRAVENOUS | Status: AC
  Administered 2024-04-05: 2 g via INTRAVENOUS

## 2024-04-05 MED ORDER — GLYCOPYRROLATE 0.2 MG/ML IJ SOLN
INTRAMUSCULAR | Status: DC | PRN
  Administered 2024-04-05: .2 mg via INTRAVENOUS

## 2024-04-05 MED ORDER — SODIUM CHLORIDE 0.9 % IR SOLN
Status: DC | PRN
  Administered 2024-04-05: 3000 mL

## 2024-04-05 MED ORDER — CHLORHEXIDINE GLUCONATE 0.12 % MT SOLN
OROMUCOSAL | Status: AC
  Filled 2024-04-05: qty 15

## 2024-04-05 MED ORDER — MIDAZOLAM HCL 2 MG/2ML IJ SOLN
INTRAMUSCULAR | Status: AC
  Filled 2024-04-05: qty 2

## 2024-04-05 SURGICAL SUPPLY — 26 items
ADHESIVE MASTISOL STRL (MISCELLANEOUS) IMPLANT
BAG DRAIN SIEMENS DORNER NS (MISCELLANEOUS) ×1 IMPLANT
BAG PRESSURE INF REUSE 3000 (BAG) ×1 IMPLANT
BRUSH SCRUB EZ 4% CHG (MISCELLANEOUS) ×1 IMPLANT
CATH URET FLEX-TIP 2 LUMEN 10F (CATHETERS) IMPLANT
CATH URETL OPEN 5X70 (CATHETERS) IMPLANT
CNTNR URN SCR LID CUP LEK RST (MISCELLANEOUS) IMPLANT
DRAPE UTILITY 15X26 TOWEL STRL (DRAPES) ×1 IMPLANT
DRSG TEGADERM 2-3/8X2-3/4 SM (GAUZE/BANDAGES/DRESSINGS) IMPLANT
FIBER LASER MOSES 200 DFL (Laser) IMPLANT
FIBER LASER MOSES 365 DFL (Laser) IMPLANT
GOWN STRL REUS W/ TWL LRG LVL3 (GOWN DISPOSABLE) ×1 IMPLANT
GOWN STRL REUS W/ TWL XL LVL3 (GOWN DISPOSABLE) ×1 IMPLANT
GUIDEWIRE STR DUAL SENSOR (WIRE) ×1 IMPLANT
KIT TURNOVER CYSTO (KITS) ×1 IMPLANT
PACK CYSTO AR (MISCELLANEOUS) ×1 IMPLANT
SET CYSTO W/LG BORE CLAMP LF (SET/KITS/TRAYS/PACK) ×1 IMPLANT
SHEATH NAVIGATOR HD 12/14X36 (SHEATH) IMPLANT
SHEATH NAVIGATOR HD 12/14X46 (SHEATH) IMPLANT
SOL .9 NS 3000ML IRR UROMATIC (IV SOLUTION) ×1 IMPLANT
STENT URET 6FRX24 CONTOUR (STENTS) IMPLANT
STENT URET 6FRX26 CONTOUR (STENTS) IMPLANT
SURGILUBE 2OZ TUBE FLIPTOP (MISCELLANEOUS) ×1 IMPLANT
SYR 10ML LL (SYRINGE) ×1 IMPLANT
VALVE UROSEAL ADJ ENDO (VALVE) IMPLANT
WATER STERILE IRR 500ML POUR (IV SOLUTION) ×1 IMPLANT

## 2024-04-05 NOTE — Anesthesia Postprocedure Evaluation (Signed)
 Anesthesia Post Note  Patient: VALTON SCHWARTZ  Procedure(s) Performed: CYSTOSCOPY/URETEROSCOPY/HOLMIUM LASER/STENT PLACEMENT (Right: Ureter)  Patient location during evaluation: PACU Anesthesia Type: General Level of consciousness: awake and alert Pain management: pain level controlled Vital Signs Assessment: post-procedure vital signs reviewed and stable Respiratory status: spontaneous breathing, nonlabored ventilation, respiratory function stable and patient connected to nasal cannula oxygen  Cardiovascular status: blood pressure returned to baseline and stable Postop Assessment: no apparent nausea or vomiting Anesthetic complications: no   No notable events documented.   Last Vitals:  Vitals:   04/05/24 1430 04/05/24 1445  BP: 118/70 115/65  Pulse: 62 62  Resp: 18 18  Temp:    SpO2: 98% 97%    Last Pain:  Vitals:   04/05/24 1445  TempSrc:   PainSc: 0-No pain                 Debby Mines

## 2024-04-05 NOTE — Transfer of Care (Signed)
 Immediate Anesthesia Transfer of Care Note  Patient: Ian Mills  Procedure(s) Performed: CYSTOSCOPY/URETEROSCOPY/HOLMIUM LASER/STENT PLACEMENT (Right: Ureter)  Patient Location: PACU  Anesthesia Type:General  Level of Consciousness: awake, drowsy, and patient cooperative  Airway & Oxygen  Therapy: Patient Spontanous Breathing and Patient connected to face mask oxygen   Post-op Assessment: Report given to RN and Post -op Vital signs reviewed and stable  Post vital signs: Reviewed and stable  Last Vitals:  Vitals Value Taken Time  BP 128/71 04/05/24 14:00  Temp 36.2 C 04/05/24 14:00  Pulse 66 04/05/24 14:03  Resp 15 04/05/24 14:03  SpO2 96 % 04/05/24 14:03  Vitals shown include unfiled device data.  Last Pain:  Vitals:   04/05/24 1400  TempSrc:   PainSc: 0-No pain      Patients Stated Pain Goal: 0 (04/05/24 1217)  Complications: No notable events documented.

## 2024-04-05 NOTE — H&P (Signed)
 04/05/24 12:37 PM   Lynwood FORBES Saba [birth year] [id number]  CC: Right renal stone  HPI: 68 year old male with extensive cardiac history, large right renal stone.  He deferred PCNL and opted for trial of alkalinization therapy based on uric acid stone, had moderate shrinkage of the stone to approximately 2 to 2.5 cm.  He again deferred PCNL and opted for ureteroscopy.   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 b. 06/2013 PCI: CTO LAD, patent LAD stent, RCA 70p (FFR 0.74--> DES), 27m (DES), RPDA 50%, EF 20%; c. 08/2019 Cath: LM nl, LAD 100p/m, D1 60, D2 patent stent, LCX 60ost/p, 39m ISR, RCA 30p, 67m ISR.   Dizziness 03/30/2015   Family history of lung cancer    HFrEF (heart failure with reduced ejection fraction) (HCC)    a. 05/2014 Echo: EF 25-30%; b. 01/2018 Echo ([facility]): EF 30%; c. 06/2021 Echo: EF 25-30%, apical AK, GrI DD, nl RV fxn.   HLD (hyperlipidemia)    HTN (hypertension)    Insomnia 06/01/2014   Ischemic cardiomyopathy    a. 05/2014 Echo: EF 25-30%; b. 01/2018 Echo ([facility]): EF 30%, c. 06/2021 Echo: EF 25-30%.   LAFB (left anterior fascicular block)    Lateral wall myocardial infarction (HCC) 06/29/1997   a.) LHC: EF 44%; 75% RPDA and 95% OM3; PCI performed and a 3.0 x 18 mm Duet stent placed to OM3   Leukocytosis 08/16/2022   Long term current use of clopidogrel     Long-term use of aspirin   therapy    Nephrolithiasis    NSTEMI (non-ST elevated myocardial infarction) (HCC) 11/27/2011   a.) LHC: 100% mLAD; intervention deferred opting for medical management.   Personal history of colonic polyps    Pneumonia 08/2018   Pulmonary hypertension (HCC)    Sacroiliac pain 07/31/2016   Sepsis due to group A Streptococcus with acute renal failure and septic shock (HCC) 2023   Syncope and collapse    Tobacco abuse 06/13/2016   Ventricular tachycardia (HCC) 12/01/2011   a.) VT arrest; ROSC achieved --> [employer] AICD placed.    Surgical History: Past Surgical History:  Procedure Laterality Date   APPENDECTOMY N/A    CARDIAC DEFIBRILLATOR PLACEMENT N/A 12/01/2011   CATARACT EXTRACTION W/PHACO Left 08/06/2023   Procedure: CATARACT EXTRACTION PHACO AND INTRAOCULAR LENS PLACEMENT (IOC) LEFT 4.61, 00:28.3;  Surgeon: Mittie Gaskin, MD;  Location: [facility];  Service: Ophthalmology;  Laterality: Left;   COLONOSCOPY WITH PROPOFOL  N/A 12/22/2020   Procedure: COLONOSCOPY WITH PROPOFOL ;  Surgeon: Therisa Bi, MD;  Location: [facility] END[facility]COPY;  Service: Gastroenterology;  Laterality: N/A;   CORONARY ANGIOPLASTY Left 07/05/1991   Procedure: CORONARY ANGIOPLASTY; Location: [facility]; Surgeon: Miquel Ellen, MD   CORONARY ANGIOPLASTY Left 08/06/1991   Procedure: CORONARY ANGIOPLASTY; Location: [facility]; Surgeon: Steffan Score, MD   CORONARY ANGIOPLASTY Left  08/09/1991   Procedure: CORONARY ANGIOPLASTY; Location: [facility]; Surgeon: Steffan Score, MD   CORONARY ANGIOPLASTY Left 10/28/1991   Procedure: CORONARY ANGIOPLASTY; Location: [facility]; Surgeon: Steffan Score, MD   CORONARY ANGIOPLASTY Left 11/02/1991   Procedure: CORONARY ANGIOPLASTY; Location: [facility]; Surgeon: Steffan Score, MD   CORONARY ANGIOPLASTY Left 01/10/1992   Procedure: CORONARY ANGIOPLASTY; Location: [facility]; Surgeon: Charlie Ross, MD   CORONARY ANGIOPLASTY Left 09/26/1993   Procedure: CORONARY ANGIOPLASTY; Location: [facility]; Surgeon:  Charlie Hasten, MD   CORONARY ANGIOPLASTY Left 09/11/2006   Procedure: CORONARY ANGIOPLASTY; Location: [facility]   CORONARY ANGIOPLASTY Left 01/17/2010   Procedure: CORONARY ANGIOPLASTY; Location: [facility]; Surgeon: Prentice Risk, MD   CORONARY ANGIOPLASTY Left 11/27/2011   Procedure: CORONARY ANGIOPLASTY; Location: [facility]; Surgeon: Prentice Flatten, MD   CORONARY ANGIOPLASTY WITH STENT PLACEMENT Left 06/29/1997   Procedure: CORONARY ANGIOPLASTY WITH STENT PLACEMENT (3.0 x 18 mm Diet stent to OM3); Location: [facility]; Surgeon: Durrell Zidar, MD   CORONARY ANGIOPLASTY WITH STENT PLACEMENT Left 07/12/1998   Procedure: CORONARY ANGIOPLASTY WITH STENT PLACEMENT (2.8 x 8 mm Duet stent to D1, 2.5 x 13 mm Duet stent to RPDA); Location: [facility]   GRAFT APPLICATION Left 08/24/2021   Procedure: GRAFT APPLICATION;  Surgeon: Tye Millet, DO;  Location: [facility] ORS;  Service: General;  Laterality: Left;   LEFT HEART CATH AND CORONARY ANGIOGRAPHY Left 08/30/2019   Procedure: LEFT HEART CATH AND CORONARY ANGIOGRAPHY;  Surgeon: Darron Deatrice LABOR, MD;  Location: [facility] INVASIVE CV LAB;  Service: Cardiovascular;  Laterality: Left;   PROSTATE SURGERY     urethra grew around prostate    TUMOR REMOVAL  2001   chest thymic cyst; behind lungs [facility] benign     Family History: Family History  Problem Relation Age of Onset   Cancer - Lung Mother    Hypertension Mother    Heart attack Mother    Cancer Father        larynx   Dementia Maternal Grandmother    Heart attack Paternal Grandfather    Colon cancer Neg Hx    Stomach cancer Neg Hx    Pancreatic cancer Neg Hx     Social History:  reports that he has been smoking cigarettes. He started smoking about 46 years ago. He has a 21 pack-year smoking history. He has never used smokeless tobacco. He reports current alcohol  use of about 1.0 standard drink of alcohol  per week. He reports that he does not use drugs.  Physical Exam: BP 114/73   Pulse 64   Temp 98.7 F (37.1 C) (Temporal)    Resp 14   Ht 5' 8 (1.727 m)   Wt 90.4 kg   SpO2 97%   BMI 30.30 kg/m    Constitutional:  Alert and oriented, No acute distress. Cardiovascular: Regular rate and rhythm Respiratory: Clear to auscultation bilaterally GI: Abdomen is soft, nontender, nondistended, no abdominal masses   Laboratory Data: Urine culture 8/13 mixed flora  Assessment & Plan:   68 year old male with large right uric acid renal stone, deferred PCNL, had some improvement on alkalinization therapy but ultimately opted for ureteroscopy.  We specifically discussed the risks ureteroscopy including bleeding, infection/sepsis, stent related symptoms including flank pain/urgency/frequency/incontinence/dysuria, ureteral injury, ureteral stricture, inability to access stone, or need for staged or additional procedures.  Right ureteroscopy, laser lithotripsy, stent placement today    Redell Burnet, MD 04/05/2024  [facility] [address] [place], [place] [address] [phone]

## 2024-04-05 NOTE — Op Note (Signed)
 Date of procedure: 04/05/24  Preoperative diagnosis:  Right renal stone  Postoperative diagnosis:  Same  Procedure: Cystoscopy, right ureteroscopy, laser lithotripsy of renal stone, right retrograde pyelogram with intraoperative interpretation, right ureteral stent placement  Surgeon: Redell Burnet, MD  Anesthesia: General  Complications: None  Intraoperative findings:  Open prostatic fossa from prior TURP, normal bladder Uncomplicated dusting of large 2 cm renal pelvis uric acid stone, and lower pole stone with stent placement  EBL: Minimal  Specimens: None  Drains: Right 6 French by 24 cm ureteral stent  Indication: Ian Mills is a 68 y.o. patient with large right renal stone with only partial dissolution with potassium citrate  who opted for ureteroscopy, he refused PCNL.  After reviewing the management options for treatment, they elected to proceed with the above surgical procedure(s). We have discussed the potential benefits and risks of the procedure, side effects of the proposed treatment, the likelihood of the patient achieving the goals of the procedure, and any potential problems that might occur during the procedure or recuperation. Informed consent has been obtained.  Description of procedure:  The patient was taken to the operating room and general anesthesia was induced. SCDs were placed for DVT prophylaxis. The patient was placed in the dorsal lithotomy position, prepped and draped in the usual sterile fashion, and preoperative antibiotics were administered. A preoperative time-out was performed.   A 21 French rigid cystoscope was used to intubate the urethra and normal-appearing urethra was followed proximally to the bladder.  Prostatic fossa was wide open from prior TURP, the bladder was grossly normal with no suspicious lesions.  A sensor wire was advanced into the right ureteral orifice and passed up to the right kidney under fluoroscopic vision.  An 63/10  French dual-lumen ureteral access catheter was advanced over the wire up to the kidney under fluoroscopic vision.  A second safety sensor wire was added.  A 12/14 French ureteral access sheath was gently advanced over the wire up to the kidney.  Thorough pyeloscopy showed a large yellow stone in the renal pelvis, as well as a smaller stone in the lower pole.  365 m laser fiber on settings of 0.5 J and 80 Hz was used to methodically dust both stones.  This took approximately 30 minutes.  At the conclusion of the procedure no stone fragments larger than the laser fiber could be identified.  A retrograde pyelogram was performed from the proximal ureter which showed no extravasation or filling defects.  The sheath was removed and careful pullback ureteroscopy showed no evidence of residual fragments or ureteral injury.  The rigid cystoscope was backloaded over the wire and a 6 [place] by 24 cm ureteral stent was placed uneventfully with a curl in the kidney as well as in the bladder under direct vision.  The bladder was drained and this concluded the procedure.   Disposition: Stable to PACU  Plan: -I recommended 2 teaspoons baking soda dissolved in water  twice daily for the next month to help dissolve stone fragments, followed by 1 to 2 teaspoons daily for uric acid stone prevention (He did not tolerate potassium citrate  previously) -Keep follow-up as scheduled for stent removal  Redell Burnet, MD

## 2024-04-05 NOTE — Anesthesia Preprocedure Evaluation (Signed)
 Anesthesia Evaluation  Patient identified by MRN, date of birth, ID band Patient awake    Reviewed: Allergy & Precautions, H&P , NPO status , Patient's Chart, lab work & pertinent test results, reviewed documented beta blocker date and time   Airway Mallampati: II  TM Distance: >3 FB Neck ROM: full    Dental  (+) Teeth Intact   Pulmonary pneumonia, resolved, COPD, Current Smoker   Pulmonary exam normal        Cardiovascular Exercise Tolerance: Poor hypertension, On Medications + angina with exertion + CAD, + Past MI, + Peripheral Vascular Disease and +CHF  Normal cardiovascular exam+ dysrhythmias + Cardiac Defibrillator  Rhythm:regular Rate:Normal     Neuro/Psych  PSYCHIATRIC DISORDERS Anxiety     negative neurological ROS     GI/Hepatic negative GI ROS, Neg liver ROS,,,  Endo/Other  negative endocrine ROS    Renal/GU Renal disease  negative genitourinary   Musculoskeletal   Abdominal   Peds  Hematology negative hematology ROS (+)   Anesthesia Other Findings Past Medical History: 07/04/1991: Acute anterior wall MI (HCC)     Comment:  a.) LHC: EF 29%; 100% mLAD -> PTCA performed 12/01/2011: AICD (automatic cardioverter/defibrillator) present     Comment:  a.) single chamber [employer] AICD  (SN: [id number]) No date: Anxiety No date: Aortic atherosclerosis (HCC) 06/10/2019: Baker's cyst     Comment:  5.3 cm 01/2018 noted US  no repair as of 06/10/2019   No date: Bilateral carotid artery disease (HCC) 05/10/2022: Cellulitis of left foot No date: COPD (chronic obstructive pulmonary disease) (HCC) No date: Coronary artery disease     Comment:  a.) 3 stents --> 3x18 mm Duet to OM3, 2.5x18 mm Duet to               D1, 2.5x13 mm Duet to RPDA. b. 06/2013 PCI: CTO LAD,               patent LAD stent, RCA 70p (FFR 0.74--> DES), 82m (DES),               RPDA 50%, EF 20%; c. 08/2019 Cath: LM nl, LAD 100p/m, D1                60, D2 patent stent, LCX 60ost/p, 86m ISR, RCA 30p, 25m               ISR. 03/30/2015: Dizziness No date: Family history of lung cancer No date: HFrEF (heart failure with reduced ejection fraction) (HCC)     Comment:  a. 05/2014 Echo: EF 25-30%; b. 01/2018 Echo ([facility]): EF               30%; c. 06/2021 Echo: EF 25-30%, apical AK, GrI DD, nl RV              fxn. No date: HLD (hyperlipidemia) No date: HTN (hypertension) 06/01/2014: Insomnia No date: Ischemic cardiomyopathy     Comment:  a. 05/2014 Echo: EF 25-30%; b. 01/2018 Echo ([facility]): EF               30%, c. 06/2021 Echo: EF 25-30%. No date: LAFB (left anterior fascicular block) 06/29/1997: Lateral wall myocardial infarction [facility])     Comment:  a.) LHC: EF 44%; 75% RPDA and 95% OM3; PCI performed and              a 3.0 x 18 mm Duet stent placed to OM3 08/16/2022: Leukocytosis No date: Long term current use of clopidogrel  No  date: Long-term use of aspirin  therapy No date: Nephrolithiasis 11/27/2011: NSTEMI (non-ST elevated myocardial infarction) (HCC)     Comment:  a.) LHC: 100% mLAD; intervention deferred opting for               medical management. No date: Personal history of colonic polyps 08/2018: Pneumonia No date: Pulmonary hypertension (HCC) 07/31/2016: Sacroiliac pain 2023: Sepsis due to group A Streptococcus with acute renal failure  and septic shock (HCC) No date: Syncope and collapse 06/13/2016: Tobacco abuse 12/01/2011: Ventricular tachycardia (HCC)     Comment:  a.) VT arrest; ROSC achieved --> [employer] AICD               placed. Past Surgical History: No date: APPENDECTOMY; N/A 12/01/2011: CARDIAC DEFIBRILLATOR PLACEMENT; N/A 08/06/2023: CATARACT EXTRACTION W/PHACO; Left     Comment:  Procedure: CATARACT EXTRACTION PHACO AND INTRAOCULAR               LENS PLACEMENT (IOC) LEFT 4.61, 00:28.3;  Surgeon:               Mittie Gaskin, MD;  Location: [facility];              Service:  Ophthalmology;  Laterality: Left; 12/22/2020: COLONOSCOPY WITH PROPOFOL ; N/A     Comment:  Procedure: COLONOSCOPY WITH PROPOFOL ;  Surgeon: Therisa Bi, MD;  Location: [facility] ENDOSCOPY;  Service:               Gastroenterology;  Laterality: N/A; 07/05/1991: CORONARY ANGIOPLASTY; Left     Comment:  Procedure: CORONARY ANGIOPLASTY; Location: [facility];               Surgeon: Miquel Ellen, MD 08/06/1991: CORONARY ANGIOPLASTY; Left     Comment:  Procedure: CORONARY ANGIOPLASTY; Location: [facility];               Surgeon: Steffan Score, MD 08/09/1991: CORONARY ANGIOPLASTY; Left     Comment:  Procedure: CORONARY ANGIOPLASTY; Location: [facility];               Surgeon: Steffan Score, MD 10/28/1991: CORONARY ANGIOPLASTY; Left     Comment:  Procedure: CORONARY ANGIOPLASTY; Location: [facility];               Surgeon: Steffan Score, MD 11/02/1991: CORONARY ANGIOPLASTY; Left     Comment:  Procedure: CORONARY ANGIOPLASTY; Location: [facility];               Surgeon: Steffan Score, MD 01/10/1992: CORONARY ANGIOPLASTY; Left     Comment:  Procedure: CORONARY ANGIOPLASTY; Location: [facility];               Surgeon: Charlie Ross, MD 09/26/1993: CORONARY ANGIOPLASTY; Left     Comment:  Procedure: CORONARY ANGIOPLASTY; Location: [facility];               Surgeon: Charlie Hasten, MD 09/11/2006: CORONARY ANGIOPLASTY; Left     Comment:  Procedure: CORONARY ANGIOPLASTY; Location: [facility] 01/17/2010: CORONARY ANGIOPLASTY; Left     Comment:  Procedure: CORONARY ANGIOPLASTY; Location: [facility];               Surgeon: Prentice Risk, MD 11/27/2011: CORONARY ANGIOPLASTY; Left     Comment:  Procedure: CORONARY ANGIOPLASTY; Location: [facility];               Surgeon: Prentice Flatten, MD 06/29/1997: CORONARY ANGIOPLASTY WITH STENT PLACEMENT; Left     Comment:  Procedure: CORONARY ANGIOPLASTY WITH  STENT PLACEMENT               (3.0 x 18 mm Diet stent to OM3); Location: [facility]; Surgeon:              Lynwood Ammons, MD 07/12/1998: CORONARY ANGIOPLASTY WITH  STENT PLACEMENT; Left     Comment:  Procedure: CORONARY ANGIOPLASTY WITH STENT PLACEMENT               (2.8 x 8 mm Duet stent to D1, 2.5 x 13 mm Duet stent to               RPDA); Location: [facility] 08/24/2021: GRAFT APPLICATION; Left     Comment:  Procedure: GRAFT APPLICATION;  Surgeon: Tye Millet,               DO;  Location: [facility] ORS;  Service: General;  Laterality:               Left; 08/30/2019: LEFT HEART CATH AND CORONARY ANGIOGRAPHY; Left     Comment:  Procedure: LEFT HEART CATH AND CORONARY ANGIOGRAPHY;                Surgeon: Darron Deatrice LABOR, MD;  Location: [facility] INVASIVE               CV LAB;  Service: Cardiovascular;  Laterality: Left; No date: PROSTATE SURGERY     Comment:  urethra grew around prostate  2001: TUMOR REMOVAL     Comment:  chest thymic cyst; behind lungs [facility] benign  BMI    Body Mass Index: 30.30 kg/m     Reproductive/Obstetrics negative OB ROS                              Anesthesia Physical Anesthesia Plan  ASA: 4  Anesthesia Plan: General ETT   Post-op Pain Management:    Induction:   PONV Risk Score and Plan: 2  Airway Management Planned:   Additional Equipment:   Intra-op Plan:   Post-operative Plan:   Informed Consent: I have reviewed the patients History and Physical, chart, labs and discussed the procedure including the risks, benefits and alternatives for the proposed anesthesia with the patient or authorized representative who has indicated his/her understanding and acceptance.     Dental Advisory Given  Plan Discussed with: CRNA  Anesthesia Plan Comments:         Anesthesia Quick Evaluation

## 2024-04-05 NOTE — Anesthesia Procedure Notes (Addendum)
 Procedure Name: Intubation Date/Time: 04/05/2024 1:03 PM  Performed by: Ledora Duncan, CRNAPre-anesthesia Checklist: Patient identified, Emergency Drugs available, Suction available and Patient being monitored Patient Re-evaluated:Patient Re-evaluated prior to induction Oxygen  Delivery Method: Circle system utilized Preoxygenation: Pre-oxygenation with 100% oxygen  Induction Type: IV induction Ventilation: Mask ventilation without difficulty Laryngoscope Size: McGrath and 3 Grade View: Grade II Tube type: Oral Number of attempts: 1 Airway Equipment and Method: Stylet Placement Confirmation: ETT inserted through vocal cords under direct vision, positive ETCO2 and breath sounds checked- equal and bilateral Secured at: 21 cm Tube secured with: Tape Dental Injury: Teeth and Oropharynx as per pre-operative assessment  Comments: Pt Anterior w/ poor dentention, atraumatic intubation-TFH CRNA

## 2024-04-06 ENCOUNTER — Telehealth: Payer: Self-pay

## 2024-04-06 ENCOUNTER — Encounter: Payer: Self-pay | Admitting: Urology

## 2024-04-06 ENCOUNTER — Telehealth: Payer: Self-pay | Admitting: Cardiology

## 2024-04-06 DIAGNOSIS — I110 Hypertensive heart disease with heart failure: Secondary | ICD-10-CM | POA: Diagnosis not present

## 2024-04-06 DIAGNOSIS — I2510 Atherosclerotic heart disease of native coronary artery without angina pectoris: Secondary | ICD-10-CM | POA: Diagnosis not present

## 2024-04-06 DIAGNOSIS — R000 Tachycardia, unspecified: Secondary | ICD-10-CM | POA: Diagnosis not present

## 2024-04-06 DIAGNOSIS — E785 Hyperlipidemia, unspecified: Secondary | ICD-10-CM | POA: Diagnosis not present

## 2024-04-06 DIAGNOSIS — I472 Ventricular tachycardia, unspecified: Secondary | ICD-10-CM | POA: Diagnosis not present

## 2024-04-06 DIAGNOSIS — I255 Ischemic cardiomyopathy: Secondary | ICD-10-CM | POA: Diagnosis not present

## 2024-04-06 DIAGNOSIS — I4891 Unspecified atrial fibrillation: Secondary | ICD-10-CM | POA: Diagnosis not present

## 2024-04-06 DIAGNOSIS — I509 Heart failure, unspecified: Secondary | ICD-10-CM | POA: Diagnosis not present

## 2024-04-06 DIAGNOSIS — I252 Old myocardial infarction: Secondary | ICD-10-CM | POA: Diagnosis not present

## 2024-04-06 DIAGNOSIS — R0989 Other specified symptoms and signs involving the circulatory and respiratory systems: Secondary | ICD-10-CM | POA: Diagnosis not present

## 2024-04-06 DIAGNOSIS — Z87891 Personal history of nicotine dependence: Secondary | ICD-10-CM | POA: Diagnosis not present

## 2024-04-06 DIAGNOSIS — I444 Left anterior fascicular block: Secondary | ICD-10-CM | POA: Diagnosis not present

## 2024-04-06 DIAGNOSIS — I499 Cardiac arrhythmia, unspecified: Secondary | ICD-10-CM | POA: Diagnosis not present

## 2024-04-06 DIAGNOSIS — I10 Essential (primary) hypertension: Secondary | ICD-10-CM | POA: Diagnosis not present

## 2024-04-06 DIAGNOSIS — R61 Generalized hyperhidrosis: Secondary | ICD-10-CM | POA: Diagnosis not present

## 2024-04-06 DIAGNOSIS — I4729 Other ventricular tachycardia: Secondary | ICD-10-CM | POA: Diagnosis not present

## 2024-04-06 DIAGNOSIS — I214 Non-ST elevation (NSTEMI) myocardial infarction: Secondary | ICD-10-CM | POA: Diagnosis not present

## 2024-04-06 DIAGNOSIS — I21A1 Myocardial infarction type 2: Secondary | ICD-10-CM | POA: Diagnosis not present

## 2024-04-06 DIAGNOSIS — Z95810 Presence of automatic (implantable) cardiac defibrillator: Secondary | ICD-10-CM | POA: Diagnosis not present

## 2024-04-06 DIAGNOSIS — R9431 Abnormal electrocardiogram [ECG] [EKG]: Secondary | ICD-10-CM | POA: Diagnosis not present

## 2024-04-06 NOTE — Telephone Encounter (Signed)
  Patient states he sent a home remote transmission and would like for someone to look over it and call him to discuss. He states his HR has been high today. He had surgery yesterday to remove a kidney stone. He's not sure if it is coming from that or not. Please advise.

## 2024-04-06 NOTE — Telephone Encounter (Signed)
 SABRA

## 2024-04-06 NOTE — Telephone Encounter (Signed)
 Patient called in today to let us  know that his HR was elevated. He had previously spoken with his cardiology team and they stated we needed to be notified. Pt states no other symptoms other than the elevated heart rate. Pt is post Op day one after his procedure with us  yesterday and has no urinary related symptoms. Pt states understanding and will follow up with his cardiology team.

## 2024-04-06 NOTE — Telephone Encounter (Signed)
 Patient reports of gall pain today post surgery and palpitations earlier today. Denies any palpitations, shortness of breath, fever/chills, nausea or vomiting this time. Patient did not take am dose on Toprol - XL on file this am, took about 30 minutes ago. Patient advised to call if any symptoms start again and resend another transmission/call at 3:00 PM to see how he is feeling. Patient also advised to contact MD office who did the procedure yesterday to advise about the pain. Patient voiced understanding and agreeable to plan.

## 2024-04-07 DIAGNOSIS — T829XXA Unspecified complication of cardiac and vascular prosthetic device, implant and graft, initial encounter: Secondary | ICD-10-CM | POA: Diagnosis not present

## 2024-04-07 DIAGNOSIS — R42 Dizziness and giddiness: Secondary | ICD-10-CM | POA: Diagnosis not present

## 2024-04-07 DIAGNOSIS — I739 Peripheral vascular disease, unspecified: Secondary | ICD-10-CM | POA: Diagnosis not present

## 2024-04-07 DIAGNOSIS — I25118 Atherosclerotic heart disease of native coronary artery with other forms of angina pectoris: Secondary | ICD-10-CM | POA: Diagnosis not present

## 2024-04-07 DIAGNOSIS — E1122 Type 2 diabetes mellitus with diabetic chronic kidney disease: Secondary | ICD-10-CM | POA: Diagnosis not present

## 2024-04-07 DIAGNOSIS — I4719 Other supraventricular tachycardia: Secondary | ICD-10-CM | POA: Diagnosis not present

## 2024-04-07 DIAGNOSIS — T801XXA Vascular complications following infusion, transfusion and therapeutic injection, initial encounter: Secondary | ICD-10-CM | POA: Diagnosis not present

## 2024-04-07 DIAGNOSIS — J449 Chronic obstructive pulmonary disease, unspecified: Secondary | ICD-10-CM | POA: Diagnosis not present

## 2024-04-07 DIAGNOSIS — N183 Chronic kidney disease, stage 3 unspecified: Secondary | ICD-10-CM | POA: Diagnosis not present

## 2024-04-07 DIAGNOSIS — I129 Hypertensive chronic kidney disease with stage 1 through stage 4 chronic kidney disease, or unspecified chronic kidney disease: Secondary | ICD-10-CM | POA: Diagnosis not present

## 2024-04-07 DIAGNOSIS — I472 Ventricular tachycardia, unspecified: Secondary | ICD-10-CM | POA: Diagnosis not present

## 2024-04-07 DIAGNOSIS — Z7982 Long term (current) use of aspirin: Secondary | ICD-10-CM | POA: Diagnosis not present

## 2024-04-07 DIAGNOSIS — N390 Urinary tract infection, site not specified: Secondary | ICD-10-CM | POA: Diagnosis not present

## 2024-04-07 DIAGNOSIS — I272 Pulmonary hypertension, unspecified: Secondary | ICD-10-CM | POA: Diagnosis not present

## 2024-04-07 DIAGNOSIS — I5042 Chronic combined systolic (congestive) and diastolic (congestive) heart failure: Secondary | ICD-10-CM | POA: Diagnosis not present

## 2024-04-07 DIAGNOSIS — N179 Acute kidney failure, unspecified: Secondary | ICD-10-CM | POA: Diagnosis not present

## 2024-04-07 DIAGNOSIS — R0789 Other chest pain: Secondary | ICD-10-CM | POA: Diagnosis not present

## 2024-04-07 DIAGNOSIS — I110 Hypertensive heart disease with heart failure: Secondary | ICD-10-CM | POA: Diagnosis not present

## 2024-04-07 DIAGNOSIS — I214 Non-ST elevation (NSTEMI) myocardial infarction: Secondary | ICD-10-CM | POA: Diagnosis not present

## 2024-04-07 DIAGNOSIS — R7989 Other specified abnormal findings of blood chemistry: Secondary | ICD-10-CM | POA: Diagnosis not present

## 2024-04-07 DIAGNOSIS — I10 Essential (primary) hypertension: Secondary | ICD-10-CM | POA: Diagnosis not present

## 2024-04-07 DIAGNOSIS — I5022 Chronic systolic (congestive) heart failure: Secondary | ICD-10-CM | POA: Diagnosis not present

## 2024-04-07 DIAGNOSIS — Z95810 Presence of automatic (implantable) cardiac defibrillator: Secondary | ICD-10-CM | POA: Diagnosis not present

## 2024-04-07 DIAGNOSIS — I252 Old myocardial infarction: Secondary | ICD-10-CM | POA: Diagnosis not present

## 2024-04-07 DIAGNOSIS — Z87891 Personal history of nicotine dependence: Secondary | ICD-10-CM | POA: Diagnosis not present

## 2024-04-07 DIAGNOSIS — I2510 Atherosclerotic heart disease of native coronary artery without angina pectoris: Secondary | ICD-10-CM | POA: Diagnosis not present

## 2024-04-07 DIAGNOSIS — Z960 Presence of urogenital implants: Secondary | ICD-10-CM | POA: Diagnosis not present

## 2024-04-07 DIAGNOSIS — I130 Hypertensive heart and chronic kidney disease with heart failure and stage 1 through stage 4 chronic kidney disease, or unspecified chronic kidney disease: Secondary | ICD-10-CM | POA: Diagnosis not present

## 2024-04-07 DIAGNOSIS — B9620 Unspecified Escherichia coli [E. coli] as the cause of diseases classified elsewhere: Secondary | ICD-10-CM | POA: Diagnosis not present

## 2024-04-07 DIAGNOSIS — I4729 Other ventricular tachycardia: Secondary | ICD-10-CM | POA: Diagnosis not present

## 2024-04-07 DIAGNOSIS — I509 Heart failure, unspecified: Secondary | ICD-10-CM | POA: Diagnosis not present

## 2024-04-07 DIAGNOSIS — I255 Ischemic cardiomyopathy: Secondary | ICD-10-CM | POA: Diagnosis not present

## 2024-04-07 DIAGNOSIS — F419 Anxiety disorder, unspecified: Secondary | ICD-10-CM | POA: Diagnosis not present

## 2024-04-07 DIAGNOSIS — I808 Phlebitis and thrombophlebitis of other sites: Secondary | ICD-10-CM | POA: Diagnosis not present

## 2024-04-07 DIAGNOSIS — I809 Phlebitis and thrombophlebitis of unspecified site: Secondary | ICD-10-CM | POA: Diagnosis not present

## 2024-04-07 DIAGNOSIS — R918 Other nonspecific abnormal finding of lung field: Secondary | ICD-10-CM | POA: Diagnosis not present

## 2024-04-07 DIAGNOSIS — I2583 Coronary atherosclerosis due to lipid rich plaque: Secondary | ICD-10-CM | POA: Diagnosis not present

## 2024-04-07 DIAGNOSIS — I259 Chronic ischemic heart disease, unspecified: Secondary | ICD-10-CM | POA: Diagnosis not present

## 2024-04-07 DIAGNOSIS — I6529 Occlusion and stenosis of unspecified carotid artery: Secondary | ICD-10-CM | POA: Diagnosis not present

## 2024-04-07 DIAGNOSIS — E785 Hyperlipidemia, unspecified: Secondary | ICD-10-CM | POA: Diagnosis not present

## 2024-04-07 DIAGNOSIS — N1831 Chronic kidney disease, stage 3a: Secondary | ICD-10-CM | POA: Diagnosis not present

## 2024-04-07 DIAGNOSIS — I071 Rheumatic tricuspid insufficiency: Secondary | ICD-10-CM | POA: Diagnosis not present

## 2024-04-07 NOTE — Telephone Encounter (Signed)
 Outreach made to Pt.  Per Pt he received 3 shocks yesterday and went to [facility].  He is currently in [place] [facility] awaiting a bed at [facility].  Repeat transmission received at 1:27 pm yesterday afternoon indicates Pt with continued tachycardia.

## 2024-04-08 DIAGNOSIS — E785 Hyperlipidemia, unspecified: Secondary | ICD-10-CM | POA: Diagnosis not present

## 2024-04-08 DIAGNOSIS — R0789 Other chest pain: Secondary | ICD-10-CM | POA: Diagnosis not present

## 2024-04-08 DIAGNOSIS — Z95810 Presence of automatic (implantable) cardiac defibrillator: Secondary | ICD-10-CM | POA: Diagnosis not present

## 2024-04-08 DIAGNOSIS — I472 Ventricular tachycardia, unspecified: Secondary | ICD-10-CM | POA: Diagnosis not present

## 2024-04-08 DIAGNOSIS — F419 Anxiety disorder, unspecified: Secondary | ICD-10-CM | POA: Diagnosis not present

## 2024-04-08 DIAGNOSIS — R42 Dizziness and giddiness: Secondary | ICD-10-CM | POA: Diagnosis not present

## 2024-04-08 DIAGNOSIS — I10 Essential (primary) hypertension: Secondary | ICD-10-CM | POA: Diagnosis not present

## 2024-04-08 DIAGNOSIS — I259 Chronic ischemic heart disease, unspecified: Secondary | ICD-10-CM | POA: Diagnosis not present

## 2024-04-08 DIAGNOSIS — I2510 Atherosclerotic heart disease of native coronary artery without angina pectoris: Secondary | ICD-10-CM | POA: Diagnosis not present

## 2024-04-08 DIAGNOSIS — I214 Non-ST elevation (NSTEMI) myocardial infarction: Secondary | ICD-10-CM | POA: Diagnosis not present

## 2024-04-08 DIAGNOSIS — I5022 Chronic systolic (congestive) heart failure: Secondary | ICD-10-CM | POA: Diagnosis not present

## 2024-04-08 DIAGNOSIS — I255 Ischemic cardiomyopathy: Secondary | ICD-10-CM | POA: Diagnosis not present

## 2024-04-14 ENCOUNTER — Telehealth: Payer: Self-pay

## 2024-04-14 NOTE — Transitions of Care (Post Inpatient/ED Visit) (Signed)
   04/14/2024  Name: Ian Mills MRN: [id number] DOB: 16-Jan-1956  Today's TOC FU Call Status: Today's TOC FU Call Status:: Unsuccessful Call (1st Attempt) Unsuccessful Call (1st Attempt) Date: 04/14/24  Attempted to reach the patient regarding the most recent Inpatient/ED visit.  Follow Up Plan: Additional outreach attempts will be made to reach the patient to complete the Transitions of Care (Post Inpatient/ED visit) call.   Arvin Seip RN, BSN, CCM [employer], Population Health Case Manager Phone: [phone]

## 2024-04-14 NOTE — Telephone Encounter (Signed)
 Device Alert received from [employer]. Battery estimated <3 months.   ____________________________________________________________________  Left message on voicemail requesting a call back to discuss battery nearing ERI.   Patient previously saw Suzann Riddle, NP on 03/19/2024. Battery at that time was estimated 8 months. Battery as of today is <3 months. Will forward to [facility] scheduling team to schedule appointment to discuss Gen Change.   Will continue to monitor and update accordingly.

## 2024-04-14 NOTE — Telephone Encounter (Signed)
 Noted. Will make sure patient to F/U w/ Dr. Kennyth. Thanks!

## 2024-04-15 ENCOUNTER — Encounter: Admitting: Urology

## 2024-04-15 ENCOUNTER — Telehealth: Payer: Self-pay

## 2024-04-15 NOTE — Patient Instructions (Signed)
 Visit Information  Thank you for taking time to visit with me today. Please don't hesitate to contact me if I can be of assistance to you.  If your defibrillator fires call 911 or seek emergency medical help. Follow up with your cardiologist as scheduled    UTI Drink plenty of water  or other fluids. Take antibiotics exactly as your healthcare provider tells you. Monitor for fever, chills, increased urination, burning on urination, blood in urine, nausea, vomiting, or pain in abdomen    Patient verbalizes understanding of instructions and care plan provided today and agrees to view in MyChart. Active MyChart status and patient understanding of how to access instructions and care plan via MyChart confirmed with patient.     The patient has been provided with contact information for the care management team and has been advised to call with any health related questions or concerns.   Please call the care guide team at [phone] if you need to cancel or reschedule your appointment.   Please call the Suicide and Crisis Lifeline: 988 if you are experiencing a Mental Health or Behavioral Health Crisis or need someone to talk to.  Karys Meckley J. Melynda Krzywicki RN, MSN [facility], [facility] RN Care Manager Direct Dial: [phone]  Fax: [phone] Website: [email or web address]

## 2024-04-15 NOTE — Transitions of Care (Post Inpatient/ED Visit) (Signed)
 04/15/2024  Name: Ian Mills MRN: [id number] DOB: Dec 06, 1955  Today's TOC FU Call Status:   Patient's Name and Date of Birth confirmed.  Transition Care Management Follow-up Telephone Call Date of Discharge: 04/13/24 Discharge Facility: Other [facility]) Name of Other (Non-[facility]) Discharge Facility: [facility] Type of Discharge: Inpatient Admission Primary Inpatient Discharge Diagnosis:: ICD (implantable cardioverter-defibrillator) discharge How have you been since you were released from the hospital?: Better Any questions or concerns?: No  Items Reviewed: Did you receive and understand the discharge instructions provided?: Yes Medications obtained,verified, and reconciled?: Yes (Medications Reviewed) Any new allergies since your discharge?: No Dietary orders reviewed?: Yes Type of Diet Ordered:: Heart Healthy Do you have support at home?: Yes People in Home [RPT]: spouse  Medications Reviewed Today: Medications Reviewed Today     Reviewed by Jovann Luse, RN (Case Manager) on 04/15/24 at 1423  Med List Status: <None>   Medication Order Taking? Sig Documenting Provider Last Dose Status Informant  ALPRAZolam  (XANAX ) 0.5 MG tablet [id number] Yes Take 1 tablet (0.5 mg total) by mouth 3 (three) times daily as needed. for anxiety Hope Merle, MD  Active Self  amiodarone  (PACERONE ) 200 MG tablet [id number] Yes Take 200 mg by mouth daily. [provider]  Active   aspirin  EC 81 MG tablet [id number] Yes Take 81 mg by mouth daily. Swallow whole. [provider]  Active Self  atorvastatin  (LIPITOR) 80 MG tablet [id number] Yes Take 1 tablet (80 mg total) by mouth daily. Gerard Frederick, NP  Active Self  clopidogrel  (PLAVIX ) 75 MG tablet [id number] Yes Take 1 tablet (75 mg total) by mouth daily. Gerard Frederick, NP  Active Self  dapagliflozin  propanediol (FARXIGA ) 10 MG TABS tablet [id number]  Take 1 tablet (10 mg total) by mouth daily before  breakfast.  Patient not taking: Reported on 04/15/2024   Gerard Frederick, NP  Active Self  ezetimibe  (ZETIA ) 10 MG tablet [id number] Yes Take 1 tablet (10 mg total) by mouth daily. Gerard Frederick, NP  Active Self  metoprolol  succinate (TOPROL  XL) 25 MG 24 hr tablet [id number] Yes Take 1 tablet (25 mg total) by mouth daily. Riddle, Suzann, NP  Active Self           Med Note SOILA, LYLE BROCKS   Wed Mar 24, 2024  1:19 PM) Has not started yet  metoprolol  succinate (TOPROL -XL) 50 MG 24 hr tablet [id number]  Take 0.5 tablets (25 mg total) by mouth in the morning and at bedtime. Take with or immediately following a meal.  Patient not taking: Reported on 04/15/2024   Kennyth Chew, MD  Active Self  nitrofurantoin, macrocrystal-monohydrate, (MACROBID) 100 MG capsule [id number] Yes Take 100 mg by mouth 2 (two) times daily. [provider]  Active   sacubitril -valsartan  (ENTRESTO ) 49-51 MG [id number] Yes Take 1 tablet by mouth 2 (two) times daily.  Patient taking differently: Take 0.5 tablets by mouth 2 (two) times daily.   Gerard Frederick, NP  Active Self  sildenafil  (VIAGRA ) 100 MG tablet [id number]  Take 1 tablet (100 mg total) by mouth daily as needed for erectile dysfunction (take 30 minute prior to sexual activity).  Patient not taking: Reported on 04/15/2024   Francisca Redell BROCKS, MD  Active Self  spironolactone  (ALDACTONE ) 25 MG tablet [id number] Yes Take 1 tablet (25 mg total) by mouth daily. Gerard Frederick, NP  Active Self  Tiotropium Bromide -Olodaterol (STIOLTO RESPIMAT ) 2.5-2.5 MCG/ACT AERS [id number] Yes Inhale 2 puffs into the lungs daily. Glendia Shad, MD  Active Self  torsemide  (DEMADEX ) 20 MG tablet [id number] Yes Take 1 tablet (20 mg total) by mouth as needed. Gerard Frederick, NP  Active Self            Home Care and Equipment/Supplies: Were Home Health Services Ordered?: No Any new equipment or medical supplies ordered?: No  Functional Questionnaire: Do you need assistance with  bathing/showering or dressing?: No Do you need assistance with meal preparation?: No Do you need assistance with eating?: No Do you have difficulty maintaining continence: No Do you need assistance with getting out of bed/getting out of a chair/moving?: No Do you have difficulty managing or taking your medications?: No  Follow up appointments reviewed: PCP Follow-up appointment confirmed?: No (patient will call PCP after call) MD Provider Line [phone] Given: No Specialist Hospital Follow-up appointment confirmed?: Yes Date of Specialist follow-up appointment?: 04/23/24 Follow-Up Specialty Provider:: Deborha Wittenborn Do you need transportation to your follow-up appointment?: No Do you understand care options if your condition(s) worsen?: Yes-patient verbalized understanding (If your defibrillator fires call 911 or seek emergency medical help.)  SDOH Interventions Today    Flowsheet Row Most Recent Value  SDOH Interventions   Food Insecurity Interventions Intervention Not Indicated  Housing Interventions Intervention Not Indicated  Transportation Interventions Intervention Not Indicated  Utilities Interventions Intervention Not Indicated   Discussed 30 day TOC follow up with patient.  Patient declined.  Advised to make follow up with primary provider and notify physician for any changes.   [name] Coppa J. Aryanah Enslow RN, MSN [facility] RN Care Manager Direct Dial: [phone]  Fax: [phone] Website: [email or web address]

## 2024-04-16 ENCOUNTER — Ambulatory Visit: Payer: Self-pay

## 2024-04-16 NOTE — Telephone Encounter (Signed)
      FYI Only or Action Required?: FYI only for provider.  Patient was last seen in primary care on 11/17/2023 by Hope Merle, MD.  Called Nurse Triage reporting Open Wound.  Symptoms began several days ago.  Interventions attempted: Rest, hydration, or home remedies.  Symptoms are: unchanged.  Triage Disposition: See Physician Within 24 Hours  Patient/caregiver understands and will follow disposition?: Yes  **Appt. Scheduled for 9/22**              Copied from CRM #[id number]. Topic: Clinical - Red Word Triage >> Apr 16, 2024  9:41 AM Ian Mills wrote: Red Word that prompted transfer to Nurse Triage: Pt was in hospital released on 04/13/2024, pt states where IV was placed is now blistering and bleeding. Pt was Dr. Merle Balint pt, and has a hospital f/u on 04/22/2024 with Gretel. Reason for Disposition  [1] Looks infected (e.g., spreading redness, pus) AND [2] no fever  Answer Assessment - Initial Assessment Questions 1. APPEARANCE What does the injury look like?       Left arm had IV placed from Hospital. He was Discharged on 9/16. He states the area is slightly swollen and red where the IV was placed. He stated there are now blisters present.   2. ONSET: How long ago did the injury occur?      9/16  3. LOCATION: Where is the injury located?      Left Arm   4. SIZE: A few inches of blistering, redness       5. BLEEDING: Is it bleeding now? If Yes, ask: Is it difficult to stop?       Not actively bleeding, patient has bandage applied.   6. PAIN: Is there any pain? If Yes, ask: How bad is the pain? (Scale 0-10; or none, mild, moderate, severe)  Around the old IV site he rates the pain a 7/10.    No other symptoms noted. No fever. Pt. Has TOC appt for 05/27/24, however he wishes to be seen sooner for the IV site pain/blistering. Appt. Scheduled for 9/22 with Dr. Narendra. If symptoms worsen patient stated he will been seen in UC  today.  Protocols used: Skin Injury-A-AH

## 2024-04-19 ENCOUNTER — Ambulatory Visit: Admitting: Internal Medicine

## 2024-04-20 ENCOUNTER — Ambulatory Visit: Admitting: Urology

## 2024-04-20 VITALS — BP 116/68 | HR 62

## 2024-04-20 DIAGNOSIS — Z466 Encounter for fitting and adjustment of urinary device: Secondary | ICD-10-CM

## 2024-04-20 DIAGNOSIS — N200 Calculus of kidney: Secondary | ICD-10-CM

## 2024-04-20 MED ORDER — LIDOCAINE HCL URETHRAL/MUCOSAL 2 % EX GEL
1.0000 | Freq: Once | CUTANEOUS | Status: AC
  Administered 2024-04-20: 1 via URETHRAL

## 2024-04-20 MED ORDER — NITROFURANTOIN MONOHYD MACRO 100 MG PO CAPS
100.0000 mg | ORAL_CAPSULE | Freq: Once | ORAL | Status: AC
  Administered 2024-04-20: 100 mg via ORAL

## 2024-04-20 NOTE — Telephone Encounter (Signed)
 Noted. Thank you Cassie.

## 2024-04-20 NOTE — Telephone Encounter (Signed)
 Spoke w/ patients wife (patient in the shower) - advised that after patient was scheduled for 11/11 by [place] office, patient needs sooner appointment d/t ICD nearing ERI - <3 months left. Advised the patients wife that I have rescheduled patient to 9/30 at 11:40 and asked for patient to call back as soon as he can to confirm this new appointment with Dr. Kennyth.

## 2024-04-20 NOTE — Telephone Encounter (Signed)
 Spoke w/ patient - he confirmed the appt on 9/30.

## 2024-04-20 NOTE — Patient Instructions (Signed)
 Add 2 teaspoons of baking soda dissolved in water  2 times daily over the next month to help dissolve the small stone particles.  After that point you can add 1 teaspoon daily to water  to prevent recurrence of kidney stones

## 2024-04-20 NOTE — Addendum Note (Signed)
 Addended by: ELOUISE SANTA BROCKS on: 04/20/2024 03:20 PM   Modules accepted: Orders

## 2024-04-20 NOTE — Progress Notes (Signed)
 Cystoscopy Procedure Note:  Indication: Stent removal s/p 04/05/2024 right ureteroscopy and laser lithotripsy for large 2 cm uric acid stone  Admitted to [facility] then [facility] with AICD shock x 3, urine culture ultimately grew Enterococcus and he had low-grade fever and was started on nitrofurantoin   Nitrofurantoin  given for prophylaxis  After informed consent and discussion of the procedure and its risks, Ian Mills was positioned and prepped in the standard fashion. Cystoscopy was performed with a flexible cystoscope. The stent was grasped with flexible graspers and removed in its entirety. The patient tolerated the procedure well.  Findings: Uncomplicated stent removal  Assessment and Plan: -Add 2 teaspoons baking soda dissolved in water  twice daily for the next month to dissolve stone dust/fragments, then 1 to 2 teaspoons daily for uric acid stone prevention(he did not tolerate potassium citrate  previously) -RTC 3 mo UA for pH check  Ian JAYSON Burnet, MD 04/20/2024

## 2024-04-22 NOTE — Progress Notes (Unsigned)
 Cardiology Clinic Note   Date: 04/23/2024 ID: CHON BUHL, DOB 1956/01/29, MRN [id number]  [facility] Providers Cardiologist:  Evalene Lunger, MD Electrophysiologist:  Fonda Kitty, MD  Electrophysiology APP:  Riddle, Suzann, NP   Chief Complaint   Ian Mills is a 68 y.o. male who presents to the clinic today for hospital follow up.   Patient Profile   Ian Mills is followed by Dr. Gollan and Dr. Kitty for the history outlined below.      Past medical history significant for: CAD. LHC February 2008: 3 stents placed to mid LAD, 1 stent to PDA, 1 stent OM 3.  EF 35%. LHC 06/29/2013: Mid LAD 100%.  D1 20%.  Proximal LCx 30%.  Mid LCx 20%.  Proximal RCA 70%.  Mid RCA diffuse 70%.  RPDA 50%.  PCI with DES 4.0 x 23 mm to proximal RCA, DES 3.0 x 33 mm to mid RCA. LHC 08/30/2019: Proximal to mid RCA 20%.  Proximal RCA 30%.  Proximal to mid LAD 100%.  D1 #1 60%, #2 widely patent previously placed stent.  Ostial to proximal LCx 60%.  Mid LCx 40%.  Recommendation for continued aggressive medical therapy. R/LHC 04/08/2024 performed at [facility]: Patent stent in the mid RCA, proximal 30%. There appears to be a moderate or more stenosis in the distal RCA, but iFR is normal at 0.99. 40% ostial circumflex. Patent stent in the mid circumflex. Subtotal occlusion of the proximal LAD just distal to D-1. D-1 has a ostial 40% narrowing and patent stent just distal. The LAD appears to be unchanged from a cath performed at [facility] in 2013.  Chronic HFrEF/ischemic cardiomyopathy. Echo 04/07/2024 performed at [facility] health: Technically difficult study.  EF 25 to 30%.  Wall motion could not be accurately evaluated.  Entire apical region and apex from mid ventricular wall to apex is severely hypokinetic.  Normal RV size/function.  Trivial MR.  Trileaflet aortic valve with mild sclerosis and no stenosis. R/LHC 04/08/2024 performed at [facility]: RA-11 RV-51/8 PA-51/24, mean  37 PCW-24 Cardiac index-2.1 L/min/mm2  Ventricular tachycardia. ICD implantation 12/01/2011. Hypertension. Hyperlipidemia. Lipid panel 04/08/2024: LDL 48, HDL 35, TG 52, total 92. Carotid artery stenosis.  Carotid duplex 11/04/2023: Bilateral ICA 1 to 39%. COPD. CKD stage IIIa.  In summary, patient has a long history of CAD dating back to 2008 when he received multiple stents in February 2008.  EF was 35% by catheterization.  He established care with Dr. Gollan in July 2011.  In 11/2011 he had an episode of VT requiring resusition. He was transferred to [facility] and had a cardiac cath with no PCI that showed chronically occluded LAD and moderately diseased RCA. He had a defibrillator placed on 12/01/2011 given the above. Prior cardiac MRI in 08/2006 that showed anterior and anterolateral region was nonviable. EF at that time was 35%. He was admitted to the hospital in 06/2013 for syncope, chest pain, and malaise. Cardiac cath at on 06/29/2013 showed chronically mid LAD and patent stent in LCx, D1 20%, proximal LCx 30%, mid LCx 20%, prox RCA 70% (FFR 0.74) s/p PCI/DES, mid RCA 70% s/p PCI/DES, RPDA 50%, EF estimated at 20% with anterolateral akinesis and apical akinesis. There was moderately elevated LVEDP. It was recommended he undertake aggressive medical therapy and cardiac rehab, maximaze heart failure medications and consider adding aldosterone blocking medication. HCTZ was stopped. He has further had episode of NSVT in 10/2012 that did not correlate with any specific symptoms  per prior notes. Echo in 05/2014 showed an EF of 25-30%, akinesis of mid-distal anterior, apical and distal inferior walls, moderate LVH, diastolic dysfunction, mild to moderate increased LV internal cavity size, moderately dilated left atrium, mildly dilated right atrium, mild MR, and mild aortic sclerosis without stenosis. He underwent nuclear stress testing on 03/06/2015 that showed hypertensive response to exercise with a large defect of  severe severity present in the mid anterior, mid anteroseptal, apical anterior, apical inferior and apex location with no significant reversibility, findings were consistent with prior MI. EF 27%. This was a high risk study. Patient presented to [facility] ED following the stress test with complaints of sudden onset of intermittent chest pain, SOB, and lightheadedness. Troponin was negative x 1. CXR showed no active cardiopulmonary disease. He was found to be hypokalemic at 3.3. ECG showed NSR, 83 bpm, left axis deviation, cannot rule put anterolateral infarct. He ended up leaving AMA from the ED.   Echo in 01/2018 showed an EF of 30%.  He underwent a Lexiscan  Myoview  in 08/10/2019 which showed a large in size, moderate to severe, minimally reversible mid and apical anterior, anteroseptal, and anterior lateral defect that was consistent with scar possible mild peri-infarct ischemia.  There was also moderate in size moderate to severe, mid and apical fixed inferior/inferior lateral defect consistent with scar.  LVEF was less than 30% with mid and apical akinesis.  He was scheduled for left heart catheterization which showed significant underlying three-vessel coronary artery disease as detailed above.  He continues to follow with heart failure clinic and was receiving Entresto  through [employer] patient assistance.  Continue to follow with the EP for ICD downloads and device management with no arrhythmias noted on device.  He had carotid duplexes completed which revealed bilateral ICAs were consistent with 1-99% stenosis following Dr. Marea from vascular.     He followed up with Dr. Fernande from EP on 12/24/2022.  Was continued on Entresto , spironolactone , and carvedilol .  There was a question at the time if he was able to come off clopidogrel  since it was not indicated.  No interval ventricular tachycardia had been treated or noted since 2013 with device implantation and amiodarone  was subsequently discontinued.  Patient was  last seen in the office by Tylene Lunch, NP on 01/06/2024 for routine follow-up.  He was doing well from a cardiac perspective.  He had been changed from potassium chloride  to potassium citrate  secondary to kidney stone.  He was found to have elevated potassium level and potassium citrate  was reduced.  Patient was evaluated by Suzann Riddle, NP on 03/19/2024 for preop evaluation prior to cystoscopy.  Device clinic had been alerted the week prior to multiple episodes of tachycardia that appeared atrially driven.  Carvedilol  was stopped and Toprol  25 mg twice daily was started.  He denied significant EtOH or drug use or missed doses of medications around the time of palpitations.  At the time of his visit he had had no further episodes of palpitations.  He was deemed an acceptable risk for cystoscopy without further cardiac testing.  Plan to schedule with Dr. Kennyth to transition care and discuss GEN change.  Patient underwent lithotripsy with stent placement on 04/05/2024.  Patient presented to [facility] ED on 04/06/2024 with complaints of ICD shock x 3.  He reported chest discomfort immediately preceding shock but denied chest pain, shortness of breath, dizziness/lightheadedness prior to.  While in the ED he again had wide-complex tachycardia in the 160s and was started on  amiodarone  and then lidocaine  with conversion to sinus rhythm.  Echo demonstrated EF 25 to 30% as detailed above.  Labs notable for proBNP 3130, troponin 0.12>> 1.36.  Patient was transferred to [facility] for higher level of care.  En route he had 30 seconds of nonsustained VT rate 150 bpm that resolved without intervention.  He was asymptomatic during episode.  Amiodarone  drip was increased.  He had no further VT overnight and amiodarone  was decreased.  He remains on lidocaine  drip.  R/LHC demonstrated stable CAD as detailed above.  Device interrogation revealed stable ICD function with <3 months ERI, multiple NSVT, VT and VF  events on 03/08/2024 and 04/06/2024, 1 true NSVT event, all other EGM's demonstrated tachycardia either VT or atrial driven tachycardia (difficult to differentiate with lack of atrial channel).  Lidocaine  drip was discontinued on 04/10/2024.  Patient was started on amiodarone  load and transitioned to p.o. amiodarone .  Plan for device upgrade on 9/15.  Patient with low-grade fever overnight and leukocytosis.  Decision made to defer device upgrade to outpatient setting.  Patient was discharged on 04/13/2024 with amiodarone  200 mg daily, reduced home Entresto  secondary to soft BP.     History of Present Illness    Today, patient is here alone. He states he is doing well since discharge from hospital. Patient denies shortness of breath, dyspnea on exertion, lower extremity edema, orthopnea or PND. No chest pain, pressure, or tightness. No palpitations. He has returned to most of his normal activities. He has been walking for exercise and boating. He has not done any kayaking. No further shocks from ICD. He reports mild arm pain secondary to IV infiltrating in the hospital. He has a follow up for that next week. He reports some confusion with medication. Toprol  appears on med list twice once with twice a day dosing and once with once a day dosing. He has been taking it once a day based on discharge paperwork from [facility]. Given soft BP he is instructed to continue with once a day dosing. He has not needed torsemide  for the better part of a year. Suzann Riddle, NP was able to interrogate patient's device during his visit today. Of note, patient had urinary stent removed 3 days ago and was prescribed a course of Macrobid .     ROS: All other systems reviewed and are otherwise negative except as noted in History of Present Illness.  EKGs/Labs Reviewed    EKG Interpretation Date/Time:  Friday April 23 2024 10:57:11 EDT Ventricular Rate:  67 PR Interval:  176 QRS Duration:  94 QT Interval:  400 QTC  Calculation: 422 R Axis:   -49  Text Interpretation: Normal sinus rhythm Low voltage QRS Left anterior fascicular block When compared with ECG of 19-Mar-2024 14:22, No significant change was found Confirmed by Loistine Sober [phone]) on 04/23/2024 11:03:27 AM   03/19/2024: BUN 21; Creatinine, Ser 1.26; Potassium 4.9; Sodium 139   06/30/2023: Hemoglobin 16.0; WBC 8.2    Physical Exam    VS:  BP 100/60 (BP Location: Right Arm, Patient Position: Sitting, Cuff Size: Normal)   Pulse 67   Ht 5' 8 (1.727 m)   Wt 198 lb 3.2 oz (89.9 kg)   SpO2 98%   BMI 30.14 kg/m  , BMI Body mass index is 30.14 kg/m.  GEN: Well nourished, well developed, in no acute distress. Neck: No JVD or carotid bruits. Cardiac:  RRR.  No murmur. No rubs or gallops.   Respiratory:  Respirations regular and  unlabored. Clear to auscultation without rales, wheezing or rhonchi. GI: Soft, nontender, nondistended. Extremities: Radials/DP/PT 2+ and equal bilaterally. No clubbing or cyanosis. No edema.  Skin: Warm and dry, no rash. Neuro: Strength intact.  Assessment & Plan   CAD S/p PCI with 3 stents to mid LAD, 1 stent to PDA, 1 stent to OM 31 August 2006.  PCI with DES to proximal RCA and mid RCA December 2014.  LHC on 04/08/2024 demonstrated stable multivessel CAD as detailed in patient profile.  Patient denies chest pain, pressure or tightness. He has resumed most of his normal activities including walking for exercise and taking his boat out.  - Continue aspirin , Plavix , atorvastatin , Zetia , Toprol .  Chronic HFrEF/ischemic cardiomyopathy Echo September 2025 demonstrated EF 25 to 30%.  Patient denies lower extremity edema, orthopnea or PND. He has not needed a dose of torsemide  in the better part of a year. Euvolemic and well compensated on exam. - Continue Entresto , spironolactone , Toprol , torsemide  as needed.  - Continue to hold Farxiga  secondary to recent UTI.   Ventricular tachycardia S/p ICD placement May  2013.  Recent hospital admission at [facility] for VT storm with ICD shock x 3 prior to arrival at [facility] on 04/06/2024 with subsequent transferred to [facility] for higher level of care.  Initially placed on amiodarone  and lidocaine  drips.  Lidocaine  discontinued on 04/10/2024.  Patient loaded on amiodarone  and transitioned to p.o. amiodarone .  Initial plan to upgrade device however patient developed UTI so procedure was deferred to outpatient setting.  Patient was discharged on 04/13/2024.  Last potassium and magnesium  04/13/2024 3.8 and 2.3 respectively.  He reports no further shocks. He is tolerating medications. He reports he is now back on potassium chloride  not sure of dose but states it was what he was taking previously. According to the chart he had previously been taking 10 mEq. ICD interrogated today by Suzann Riddle, NP. Battery is low but she does not feel he needs a LifeVest at this time.  - Continue amiodarone  and Toprol . - Keep scheduled visit with Dr. Kennyth on 04/27/2024. - BMP and Mg today.  Hypertension BP today 100/60. No lightheadedness, dizziness, presyncope or syncope.  - Continue Entresto , spironolactone , Toprol .  Hyperlipidemia LDL 48 September 2025, at goal.  Atorvastatin  reduced during hospital admission. - Continue atorvastatin .  Disposition: BMP and Mg today. Keep previously scheduled visit with Dr. Kennyth on 9/30 and Dr. Gollan in December. Return sooner as needed.          Signed, Barnie HERO. Angelina Neece, DNP, NP-C

## 2024-04-23 ENCOUNTER — Ambulatory Visit: Attending: Student | Admitting: Student

## 2024-04-23 ENCOUNTER — Encounter: Payer: Self-pay | Admitting: Student

## 2024-04-23 VITALS — BP 100/60 | HR 67 | Ht 68.0 in | Wt 198.2 lb

## 2024-04-23 DIAGNOSIS — I2510 Atherosclerotic heart disease of native coronary artery without angina pectoris: Secondary | ICD-10-CM | POA: Insufficient documentation

## 2024-04-23 DIAGNOSIS — Z95810 Presence of automatic (implantable) cardiac defibrillator: Secondary | ICD-10-CM | POA: Insufficient documentation

## 2024-04-23 DIAGNOSIS — I10 Essential (primary) hypertension: Secondary | ICD-10-CM | POA: Insufficient documentation

## 2024-04-23 DIAGNOSIS — I255 Ischemic cardiomyopathy: Secondary | ICD-10-CM | POA: Diagnosis not present

## 2024-04-23 DIAGNOSIS — I5022 Chronic systolic (congestive) heart failure: Secondary | ICD-10-CM | POA: Diagnosis not present

## 2024-04-23 DIAGNOSIS — E785 Hyperlipidemia, unspecified: Secondary | ICD-10-CM | POA: Diagnosis not present

## 2024-04-23 DIAGNOSIS — I472 Ventricular tachycardia, unspecified: Secondary | ICD-10-CM | POA: Insufficient documentation

## 2024-04-23 DIAGNOSIS — Z79899 Other long term (current) drug therapy: Secondary | ICD-10-CM | POA: Insufficient documentation

## 2024-04-23 MED ORDER — AMIODARONE HCL 200 MG PO TABS: 200.0000 mg | ORAL_TABLET | Freq: Every day | ORAL | 2 refills | Status: DC

## 2024-04-23 NOTE — Progress Notes (Signed)
Remote ICD Transmission.

## 2024-04-23 NOTE — Patient Instructions (Signed)
 Medication Instructions:   Your physician recommends that you continue on your current medications as directed. Please refer to the Current Medication list given to you today.    *If you need a refill on your cardiac medications before your next appointment, please call your pharmacy*  Lab Work:  Your provider would like for you to have following labs drawn today BMet, Magnesium .    If you have labs (blood work) drawn today and your tests are completely normal, you will receive your results only by:  MyChart Message (if you have MyChart) OR  A paper copy in the mail If you have any lab test that is abnormal or we need to change your treatment, we will call you to review the results.  Testing/Procedures:  None ordered at this time   Referrals:  None ordered at this time   Follow-Up:  At [facility], you and your health needs are our priority.  As part of our continuing mission to provide you with exceptional heart care, our providers are all part of one team.  This team includes your primary Cardiologist (physician) and Advanced Practice Providers or APPs (Physician Assistants and Nurse Practitioners) who all work together to provide you with the care you need, when you need it.  Your next appointment:     04/27/24 @ 11:40 am with Dr. Cindie  06/28/24 @ 11:40 am with Dr. Perla   We recommend signing up for the patient portal called MyChart.  Sign up information is provided on this After Visit Summary.  MyChart is used to connect with patients for Virtual Visits (Telemedicine).  Patients are able to view lab/test results, encounter notes, upcoming appointments, etc.  Non-urgent messages can be sent to your provider as well.   To learn more about what you can do with MyChart, go to [email or web address].

## 2024-04-26 ENCOUNTER — Encounter: Payer: Self-pay | Admitting: Internal Medicine

## 2024-04-26 ENCOUNTER — Ambulatory Visit (INDEPENDENT_AMBULATORY_CARE_PROVIDER_SITE_OTHER): Admitting: Internal Medicine

## 2024-04-26 VITALS — BP 126/84 | HR 58 | Temp 97.7°F | Ht 68.0 in | Wt 197.4 lb

## 2024-04-26 DIAGNOSIS — F39 Unspecified mood [affective] disorder: Secondary | ICD-10-CM

## 2024-04-26 DIAGNOSIS — I472 Ventricular tachycardia, unspecified: Secondary | ICD-10-CM

## 2024-04-26 DIAGNOSIS — I10 Essential (primary) hypertension: Secondary | ICD-10-CM

## 2024-04-26 DIAGNOSIS — I808 Phlebitis and thrombophlebitis of other sites: Secondary | ICD-10-CM | POA: Diagnosis not present

## 2024-04-26 LAB — MAGNESIUM: Magnesium: 2 mg/dL (ref 1.5–2.5)

## 2024-04-26 LAB — BASIC METABOLIC PANEL WITH GFR
BUN: 16 mg/dL (ref 6–23)
CO2: 24 meq/L (ref 19–32)
Calcium: 9.4 mg/dL (ref 8.4–10.5)
Chloride: 106 meq/L (ref 96–112)
Creatinine, Ser: 1.21 mg/dL (ref 0.40–1.50)
GFR: 60.58 mL/min (ref >60.00)
Glucose, Bld: 103 mg/dL — ABNORMAL HIGH (ref 70–99)
Potassium: 5.1 meq/L (ref 3.5–5.1)
Sodium: 136 meq/L (ref 135–145)

## 2024-04-26 MED ORDER — ALPRAZOLAM 0.5 MG PO TABS: 0.5000 mg | ORAL_TABLET | Freq: Three times a day (TID) | ORAL | 0 refills | Status: DC | PRN

## 2024-04-26 NOTE — Assessment & Plan Note (Signed)
-   Patient has a history of anxiety on Xanax  3 times daily as needed -He has been stable on this medication but reports needing a refill prior to establishing care with his new PCP -Will refill his Xanax  today -GAD-7 score 0 -No further workup at this time

## 2024-04-26 NOTE — Patient Instructions (Signed)
  VISIT SUMMARY: During your visit, we discussed your recent health issues, including ICD firings, skin blistering from amiodarone  IV infiltration, and anxiety. We reviewed your current medications and made some adjustments to better manage your conditions.  YOUR PLAN: -VENTRICULAR TACHYCARDIA WITH ICD AND ATRIAL FIBRILLATION: Ventricular tachycardia is a fast heart rhythm that starts in the heart's lower chambers, and atrial fibrillation is an irregular and often rapid heart rate. You experienced multiple ICD firings and were diagnosed with atrial fibrillation. We will continue your amiodarone  for rhythm control and plan to upgrade your ICD to a dual-chamber device to better manage your heart rhythms. Please follow up with your cardiologist for the ICD upgrade.  -PHLEBITIS AND LOCAL SKIN REACTION OF LEFT ARM: Phlebitis is inflammation of a vein, often due to an IV infiltration. You have swelling, warmth, and blistering on your arm from the amiodarone  IV. Continue applying warm compresses and use gauze to cover the blisters. Watch for signs of infection like increased swelling, redness, or pus. If symptoms worsen, we may consider a short course of antibiotics.  -ANXIETY DISORDER: Anxiety disorder involves excessive worry or fear. You are currently taking Xanax  twice daily for anxiety. We will refill your prescription and may assess your anxiety levels at your next visit to ensure your symptoms are well-managed.  INSTRUCTIONS: Please follow up with your cardiologist for the ICD upgrade. Continue to monitor the blister on your arm and apply warm compresses. If you notice any signs of infection, contact us  immediately. We have refilled your Xanax  prescription, and we will assess your anxiety levels at your next appointment.                      Contains text generated by Abridge.                                 Contains text generated by Abridge.

## 2024-04-26 NOTE — Progress Notes (Signed)
  Electrophysiology Office Note:   Date:  04/26/2024  ID:  Ian Mills, DOB 1956-02-24, MRN [id number]  Primary Cardiologist: Evalene Lunger, MD Electrophysiologist: Fonda Kitty, MD  {Click to update primary MD,subspecialty MD or APP then REFRESH:1}    History of Present Illness:   Ian Mills is a 68 y.o. male with h/o VT s/p ICD, ICM, CAD s/p PCI, carotid artery disease, HFrEF, COPD, HTN, CKD - 3, tobacco use who is being seen today for ICD follow up.  Discussed the use of AI scribe software for clinical note transcription with the patient, who gave verbal consent to proceed.  History of Present Illness     Review of systems complete and found to be negative unless listed in HPI.   EP Information / Studies Reviewed:    EKG is not ordered today. EKG from 04/23/24 reviewed which showed sinus rhythm, PR and QRS 94ms.      Echo 04/07/24   Risk Assessment/Calculations:   {Does this patient have ATRIAL FIBRILLATION?:[id number]}          Physical Exam:   VS:  There were no vitals taken for this visit.   Wt Readings from Last 3 Encounters:  04/26/24 197 lb 6.4 oz (89.5 kg)  04/23/24 198 lb 3.2 oz (89.9 kg)  04/05/24 199 lb 4.7 oz (90.4 kg)     GEN: Well nourished, well developed in no acute distress NECK: No JVD CARDIAC: {EPRHYTHM:28826}, no murmurs, rubs, gallops RESPIRATORY:  Clear to auscultation without rales, wheezing or rhonchi  ABDOMEN: Soft, non-distended EXTREMITIES:  No edema; No deformity   ASSESSMENT AND PLAN:    #S/p ICD -Inappropriate shocks - upgrade to DDD ICD at time of gen change  Stopped amiodarone  2024 d/t lack of VT VT quiescent off amiodarone  Several episodes of SVT over the course of a single 12-h period without inciting event.  Recently switched from coreg  > metop, tolerating well Continue to monitor via device  Update BMP, mag today GORE lead****  #Ventricular tachycardia  Recent hospital admission at [facility]  for VT storm with ICD shock x 3 prior to arrival at [facility] on 04/06/2024 with subsequent transferred to [facility] for higher level of care.  Initially placed on amiodarone  and lidocaine  drips.  Lidocaine  discontinued on 04/10/2024.  Patient loaded on amiodarone  and transitioned to p.o. amiodarone .   #Chronic systolic heart failure #Ischemic cardiomyopathy Warm and euvolemic on exam GDMT - 10mg  farxiga , 25mg  toprol , 49-51 entresto , 25mg  spiro Diuretic - 20mg  torsemide  PRN  #CAD: S/p PCI with 3 stents to mid LAD, 1 stent to PDA, 1 stent to OM 31 August 2006. PCI with DES to proximal RCA and mid RCA December 2014.  Assessment & Plan       Follow up with {EPMDS:28135::EP Team} {EPFOLLOW LE:71826}  Signed, Fonda Kitty, MD

## 2024-04-26 NOTE — Assessment & Plan Note (Signed)
-   Patient was recently hospitalized with recurrent wide-complex tachycardia (likely V. tach) with multiple ICD shocks -He was admitted to [facility] and was initially on lidocaine  and amiodarone  drips but lidocaine  was discontinued and he was transition to oral amiodarone  -Cardiology follow-up and recommendations appreciated -Will continue with amiodarone  and Toprol  -He has follow-up with cardiology (Dr. Kennyth) tomorrow -Will check BMP and mag (was initially scheduled to be checked at cardiology office but was unable to be done at that time)

## 2024-04-26 NOTE — Assessment & Plan Note (Addendum)
-   This problem is chronic and stable -Blood pressure today is 126/84 (at goal) -He did complain of some low blood pressures during his recent hospitalization and had his Entresto  dose reduced at that time -Will continue with Toprol -XL 25 mg daily, Entresto  49/51 mg twice daily and spironolactone  25 mg daily -No further workup at this time

## 2024-04-26 NOTE — Progress Notes (Signed)
 Acute Office Visit  Subjective:     Patient ID: Ian Mills, male    DOB: 1955/10/12, 68 y.o.   MRN: [id number]  Chief Complaint  Patient presents with   Acute Visit    Left arm pain from infiltrated IV     Discussed the use of AI scribe software for clinical note transcription with the patient, who gave verbal consent to proceed.  History of Present Illness Ian Mills is a 68 year old male with a history of ICD placement who presents with recent ICD firings and skin blistering following amiodarone  IV infiltration.  Implantable cardioverter defibrillator (icd) firings and arrhythmia - Multiple ICD firings occurred after kidney stone removal. - First firing occurred during a walk the day after the procedure, associated with a sensation of feeling 'funny' and a heart rate of 170 bpm. - Subsequent firings occurred while waiting for EMS and during transport to the hospital. - Admitted to the ER for 32 hours and underwent cardiac catheterization. - History of ventricular tachycardia (VTAC). - Amiodarone  was resumed after ICD firings.  Amiodarone  iv infiltration and skin blistering - Developed a blister on the arm following amiodarone  IV infiltration during hospital stay. - Patient still with swelling over the left arm which has improved since his discharge.  He initially developed a blister which contained blood but this subsequently resolved.  He now has a new blister over his left arm which has not yet resolved. - Applies a cream provided by the hospital and avoids popping the blister. - History of skin grafts on the same arm due to previous injuries. - No fever or chills; temperature has been normal. - Swelling and warmth present in the area of the blister; no pus formation.  Urinary tract infection and urological procedures - Treated for a urinary tract infection with Macrobid  following kidney stone procedure, with resolution of infection. - Had a urethral stent in  place for an extended period. - Consulted a nephrologist who provided reassurance regarding the situation.  He followed up with his urologist and had the stent removed  Psychological symptoms and medication concerns - Takes Xanax  for anxiety at a dose of two pills per day. - Concerned about running out of Xanax  before next appointment. - Feels 'down and out' due to recent health issues and missed vacation plans.  Edema and diuretic use - Takes a diuretic ('fluid pill'). - No swelling in the legs.  Medication adjustments - Amiodarone  resumed after ICD firings. - Entresto  dose adjusted due to low blood pressure and Farxiga  was discontinued due to UTI    Review of Systems  Constitutional: Negative.   HENT: Negative.    Respiratory: Negative.    Cardiovascular: Negative.   Gastrointestinal: Negative.   Musculoskeletal:        Left arm swelling slowly improving  Skin:        Blister noted over left arm at the site of IV infiltration  Neurological: Negative.   Psychiatric/Behavioral: Negative.          Objective:    BP 126/84   Pulse (!) 58   Temp 97.7 F (36.5 C)   Ht 5' 8 (1.727 m)   Wt 197 lb 6.4 oz (89.5 kg)   SpO2 99%   BMI 30.01 kg/m    Physical Exam Constitutional:      Appearance: Normal appearance.  HENT:     Head: Normocephalic and atraumatic.  Cardiovascular:     Rate and Rhythm: Normal rate  and regular rhythm.     Heart sounds: Normal heart sounds.  Pulmonary:     Effort: Pulmonary effort is normal.     Breath sounds: Normal breath sounds. No wheezing, rhonchi or rales.  Abdominal:     General: Bowel sounds are normal. There is no distension.     Palpations: Abdomen is soft.     Tenderness: There is no abdominal tenderness. There is no guarding or rebound.  Musculoskeletal:        General: Swelling present. No tenderness.     Right lower leg: No edema.     Left lower leg: No edema.     Comments: Mild left upper extremity swelling over arm and  wrist at the site of IV infiltration  Skin:    Comments: Blister noted over dorsal aspect of left arm.  Mild increased local warmth noted at the site  Neurological:     Mental Status: He is alert.  Psychiatric:        Mood and Affect: Mood normal.        Behavior: Behavior normal.     No results found for any visits on 04/26/24.      Assessment & Plan:   Problem List Items Addressed This Visit       Cardiovascular and Mediastinum   Essential hypertension - Primary   - This problem is chronic and stable -Blood pressure today is 126/84 (at goal) -He did complain of some low blood pressures during his recent hospitalization and had his Entresto  dose reduced at that time -Will continue with Toprol -XL 25 mg daily, Entresto  49/51 mg twice daily and spironolactone  25 mg daily -No further workup at this time      Phlebitis of left arm   - While patient was hospitalized with V. tach he had infiltration of the IV amiodarone  and developed welling and pain in that arm -Patient states swelling and pain are slowly improving but he developed a blister over the arm.  One of the blisters initially resolved but he developed a secondary blister on the arm. -Patient was noted to have mild swelling as well as increased warmth in that arm but patient states that this has not been improving -I suspect this likely secondary to phlebitis and infiltration from IV amiodarone  - dressing done in the office today -Patient will monitor blister and if he develops worsening pain or fevers or purulent discharge or worsening erythema and swelling of that arm he will follow-up with us  for further evaluation -No further workup at this time      Ventricular tachycardia (HCC)   - Patient was recently hospitalized with recurrent wide-complex tachycardia (likely V. tach) with multiple ICD shocks -He was admitted to [facility] and was initially on lidocaine  and amiodarone  drips but lidocaine  was discontinued and  he was transition to oral amiodarone  -Cardiology follow-up and recommendations appreciated -Will continue with amiodarone  and Toprol  -He has follow-up with cardiology (Dr. Kennyth) tomorrow -Will check BMP and mag (was initially scheduled to be checked at cardiology office but was unable to be done at that time)      Relevant Orders   Basic metabolic panel with GFR   Magnesium      Other   Mood disorder   - Patient has a history of anxiety on Xanax  3 times daily as needed -He has been stable on this medication but reports needing a refill prior to establishing care with his new PCP -Will refill his Xanax  today -GAD-7 score 0 -  No further workup at this time      Relevant Medications   ALPRAZolam  (XANAX ) 0.5 MG tablet    Meds ordered this encounter  Medications   ALPRAZolam  (XANAX ) 0.5 MG tablet    Sig: Take 1 tablet (0.5 mg total) by mouth 3 (three) times daily as needed. for anxiety    Dispense:  90 tablet    Refill:  0    Refill 30 days after last refill    No follow-ups on file.  Rashun Grattan, MD

## 2024-04-26 NOTE — Assessment & Plan Note (Signed)
-   While patient was hospitalized with V. tach he had infiltration of the IV amiodarone  and developed welling and pain in that arm -Patient states swelling and pain are slowly improving but he developed a blister over the arm.  One of the blisters initially resolved but he developed a secondary blister on the arm. -Patient was noted to have mild swelling as well as increased warmth in that arm but patient states that this has not been improving -I suspect this likely secondary to phlebitis and infiltration from IV amiodarone  - dressing done in the office today -Patient will monitor blister and if he develops worsening pain or fevers or purulent discharge or worsening erythema and swelling of that arm he will follow-up with us  for further evaluation -No further workup at this time

## 2024-04-27 ENCOUNTER — Ambulatory Visit: Attending: Cardiology | Admitting: Cardiology

## 2024-04-27 ENCOUNTER — Ambulatory Visit: Payer: Self-pay | Admitting: Internal Medicine

## 2024-04-27 ENCOUNTER — Other Ambulatory Visit: Payer: Self-pay

## 2024-04-27 ENCOUNTER — Encounter: Payer: Self-pay | Admitting: Cardiology

## 2024-04-27 VITALS — BP 120/70 | HR 60 | Ht 68.0 in | Wt 197.5 lb

## 2024-04-27 DIAGNOSIS — I5022 Chronic systolic (congestive) heart failure: Secondary | ICD-10-CM | POA: Insufficient documentation

## 2024-04-27 DIAGNOSIS — I255 Ischemic cardiomyopathy: Secondary | ICD-10-CM | POA: Insufficient documentation

## 2024-04-27 DIAGNOSIS — I472 Ventricular tachycardia, unspecified: Secondary | ICD-10-CM | POA: Insufficient documentation

## 2024-04-27 DIAGNOSIS — Z95810 Presence of automatic (implantable) cardiac defibrillator: Secondary | ICD-10-CM | POA: Insufficient documentation

## 2024-04-27 DIAGNOSIS — Z79899 Other long term (current) drug therapy: Secondary | ICD-10-CM

## 2024-04-27 LAB — CUP PACEART INCLINIC DEVICE CHECK
Date Time Interrogation Session: 20250930160454
HighPow Impedance: 66 Ohm
HighPow Impedance: 80 Ohm
Implantable Lead Connection Status: 753985
Implantable Lead Implant Date: 20130503
Implantable Lead Location: 753860
Implantable Lead Model: 292
Implantable Lead Serial Number: 112568
Implantable Pulse Generator Implant Date: 20130503
Lead Channel Impedance Value: 616 Ohm
Lead Channel Pacing Threshold Amplitude: 0.4 V
Lead Channel Pacing Threshold Pulse Width: 0.5 ms
Lead Channel Sensing Intrinsic Amplitude: 19.6 mV
Lead Channel Setting Pacing Amplitude: 2.4 V
Lead Channel Setting Pacing Pulse Width: 0.5 ms
Lead Channel Setting Sensing Sensitivity: 0.6 mV
Pulse Gen Serial Number: 105232
Zone Setting Status: 755011

## 2024-04-27 MED ORDER — METOPROLOL SUCCINATE ER 50 MG PO TB24: 50.0000 mg | ORAL_TABLET | Freq: Every day | ORAL | 3 refills | Status: DC

## 2024-04-27 NOTE — Patient Instructions (Addendum)
 Medication Instructions:  Your physician has recommended you make the following change in your medication:  1) INCREASE Toprol  XL (metoprolol  succinate) to 50 mg once daily  *If you need a refill on your cardiac medications before your next appointment, please call your pharmacy*  Lab Work: BMET and CBC - please have this done at any [facility] location between October 20th and November 10th  Testing/Procedures: ICD Generator Change You are scheduled  Monday, November 17 with Dr. Sidra Kitty. Please arrive at the [facility] Entrance [address]                                                                                                                                                                                    [place], [place] [address] at 9:30 AM   We will contact you with further instructions.  Follow-Up: At [facility], you and your health needs are our priority.  As part of our continuing mission to provide you with exceptional heart care, our providers are all part of one team.  This team includes your primary Cardiologist (physician) and Advanced Practice Providers or APPs (Physician Assistants and Nurse Practitioners) who all work together to provide you with the care you need, when you need it.  Your next appointment:  We will contact you to schedule your post-procedure appointments

## 2024-04-29 ENCOUNTER — Ambulatory Visit: Payer: Self-pay | Admitting: Cardiology

## 2024-05-04 ENCOUNTER — Telehealth: Payer: Self-pay | Admitting: Pharmacy Technician

## 2024-05-04 NOTE — Telephone Encounter (Signed)
 SABRA

## 2024-05-12 ENCOUNTER — Telehealth: Payer: Self-pay

## 2024-05-12 NOTE — Telephone Encounter (Signed)
 PAP: Patient assistance application for Stioloto through [employer]) has been mailed to pt's home address on file. Provider portion of application will be faxed to provider's office. Will mail out 7 ax later he has an upcoming appt. With new pcp @ [facility].

## 2024-05-17 ENCOUNTER — Ambulatory Visit

## 2024-05-19 LAB — CUP PACEART REMOTE DEVICE CHECK
Battery Remaining Longevity: 3 mo — CL
Battery Remaining Percentage: 0 %
Brady Statistic RV Percent Paced: 0 %
Date Time Interrogation Session: 20251021152100
HighPow Impedance: 81 Ohm
Implantable Lead Connection Status: 753985
Implantable Lead Implant Date: 20130503
Implantable Lead Location: 753860
Implantable Lead Model: 292
Implantable Lead Serial Number: 112568
Implantable Pulse Generator Implant Date: 20130503
Lead Channel Impedance Value: 620 Ohm
Lead Channel Pacing Threshold Amplitude: 0.4 V
Lead Channel Pacing Threshold Pulse Width: 0.5 ms
Lead Channel Setting Pacing Amplitude: 2.4 V
Lead Channel Setting Pacing Pulse Width: 0.5 ms
Lead Channel Setting Sensing Sensitivity: 0.6 mV
Pulse Gen Serial Number: 105232
Zone Setting Status: 755011

## 2024-05-21 ENCOUNTER — Telehealth: Payer: Self-pay

## 2024-05-21 NOTE — Telephone Encounter (Signed)
-----   Message from Nurse Carlyle C sent at 04/27/2024  3:08 PM EDT ----- Regarding: 11/17 ICD gen change and RA lead addition @[facility]   Precert:  MD: Kennyth Type of implant: ICD 11/17 ICD gen change and RA lead addition Device manufacturer: [employer] Diagnosis: ERI, cardiomyopathy CPT code: ICD change out, dual - 66736 C-code(s), including quantity (if indicated):  Procedure scheduled (date/time): 11/17 at 10:00am  Procedure:  Scrub given? Yes  Medication instructions: Hold Plavix  for 5 days (stay on ASA) Message sent to CVRR? No Added to calendar? Yes Orders entered? Yes Letter complete? No, >30 days before procedure Scheduled with cath lab? Yes Labs ordered (CBC, BMET, PT/INR if on warfarin)? Yes Dye allergy? No Pre-meds ordered and instructions given? No, not needed Letter method: MyChart/Mail Special instructions: n/a H&P: 9/30  Follow-up:  Cassie/Angel, please schedule Routine.  Covering RN:  Please send this message to [employer], EP scheduler, EP Scheduling pool, and EP [employer].

## 2024-05-22 ENCOUNTER — Ambulatory Visit: Payer: Self-pay | Admitting: Cardiology

## 2024-05-25 ENCOUNTER — Telehealth (HOSPITAL_COMMUNITY): Payer: Self-pay

## 2024-05-25 ENCOUNTER — Encounter (HOSPITAL_COMMUNITY): Payer: Self-pay

## 2024-05-25 NOTE — Telephone Encounter (Signed)
 Attempted to reach patient to discuss upcoming procedure, no answer. Left VM for patient to return call.

## 2024-05-25 NOTE — Telephone Encounter (Signed)
 Spoke with patient to discuss upcoming procedure.   Confirmed patient is scheduled for a ICD generator change on Monday, November 17 with Dr. Dr. Kennyth. Instructed patient to arrive at [facility] entrance, [address] [place], [place], [place]. [address] and check in at front desk at 9:00 AM.   Labs to be completed by 11/3  Any recent signs of acute illness or been started on antibiotics? No  Any new medications started? No Any medications to hold? Yes  HOLD Plavix  (Clopidogrel ) for 5 days prior to your procedure. Your last dose will be Tuesday, November 11, PM dose.  Medication instructions:  On the morning of your procedure HOLD Spironolactone  and Torsemide  on the morning of your procedure.  No eating or drinking after midnight prior to procedure.   The night before your procedure and the morning of your procedure, wash thoroughly with the CHG surgical soap from the neck down, paying special attention to the area where your procedure will be performed.  Advised of plan to go home the same day and will only stay overnight if medically necessary. You MUST have a responsible adult to drive you home and MUST be with you the first 24 hours after you arrive home.  Informed a nurse may call a day before the procedure to confirm arrival time and ensure instructions are followed.  Advised to contact RN Navigator at [phone], to inform of any new medications started after call or concerns prior to procedure.  Patient verbalized understanding to all instructions provided and agreed to proceed with procedure.

## 2024-05-27 ENCOUNTER — Encounter: Payer: Self-pay | Admitting: Nurse Practitioner

## 2024-05-27 ENCOUNTER — Other Ambulatory Visit

## 2024-05-27 ENCOUNTER — Ambulatory Visit: Admitting: Nurse Practitioner

## 2024-05-27 VITALS — BP 120/82 | HR 58 | Temp 97.9°F | Ht 68.0 in | Wt 197.6 lb

## 2024-05-27 DIAGNOSIS — E782 Mixed hyperlipidemia: Secondary | ICD-10-CM

## 2024-05-27 DIAGNOSIS — J42 Unspecified chronic bronchitis: Secondary | ICD-10-CM

## 2024-05-27 DIAGNOSIS — I5022 Chronic systolic (congestive) heart failure: Secondary | ICD-10-CM

## 2024-05-27 DIAGNOSIS — Z95810 Presence of automatic (implantable) cardiac defibrillator: Secondary | ICD-10-CM | POA: Diagnosis not present

## 2024-05-27 DIAGNOSIS — I472 Ventricular tachycardia, unspecified: Secondary | ICD-10-CM

## 2024-05-27 DIAGNOSIS — F419 Anxiety disorder, unspecified: Secondary | ICD-10-CM

## 2024-05-27 DIAGNOSIS — J449 Chronic obstructive pulmonary disease, unspecified: Secondary | ICD-10-CM

## 2024-05-27 DIAGNOSIS — I10 Essential (primary) hypertension: Secondary | ICD-10-CM | POA: Diagnosis not present

## 2024-05-27 MED ORDER — ALPRAZOLAM 0.5 MG PO TABS: 0.5000 mg | ORAL_TABLET | Freq: Three times a day (TID) | ORAL | 5 refills | Status: AC | PRN

## 2024-05-27 NOTE — Assessment & Plan Note (Signed)
 Managed with amiodarone , metoprolol , Entresto , spironolactone , and torsemide . Euvolemic on exam. Continue current medication regimen. Follow up with Cardiology.

## 2024-05-27 NOTE — Assessment & Plan Note (Signed)
 Well managed on Stiolto 2 puff daily. Continue. Encourage tobacco cessation.

## 2024-05-27 NOTE — Assessment & Plan Note (Signed)
 An ICD generator change is scheduled for November 17th. Continue current medications. Order CBC and BMP labs for Cardiology. Proceed with surgery as scheduled.

## 2024-05-27 NOTE — Assessment & Plan Note (Signed)
 Blood pressure is well controlled with metoprolol , Entresto  and spironolactone  daily. Continue current medication regimen.

## 2024-05-27 NOTE — Assessment & Plan Note (Signed)
 Managed with Lipitor and Zetia . Continue current medication regimen.

## 2024-05-27 NOTE — Progress Notes (Signed)
 Leron Glance, NP-C Phone: [phone]  Ian Mills is a 68 y.o. male who presents today for transfer of care.   Discussed the use of AI scribe software for clinical note transcription with the patient, who gave verbal consent to proceed.  History of Present Illness   Ian Mills is a 68 year old male with ventricular tachycardia, CHF, and a defibrillator who presents for transfer of care.  He has a significant cardiac history, including a defibrillator implanted for nearly 12 years, which had never fired until April 05, 2024. He experienced multiple firings of the device following a period off amiodarone . The defibrillator first fired after he felt unwell during a walk, with a heart rate of 160 bpm, despite normal blood pressure. He was subsequently hospitalized for nearly a week and restarted on amiodarone  via a drip.  He is currently on a regimen of cardiac medications including amiodarone , metoprolol  (recently increased to 50 mg), Entresto , spironolactone , torsemide  (as needed, though he hasn't used it in a year), Lipitor, Zetia , Plavix , and baby aspirin . He also uses Spiriva  daily for COPD. No significant cough or shortness of breath beyond what he considers normal for his age.  He reports increased anxiety since the defibrillator firings, for which he takes Xanax  two to three times daily, depending on his anxiety levels. He has been on Xanax  for a long time to manage anxiety and to help avoid smoking and drinking.  His family history is notable for cancer, but not cardiac issues. He denies dizziness, chest pain, or leg swelling.      Social History   Tobacco Use  Smoking Status Some Days   Current packs/day: 0.00   Average packs/day: 0.5 packs/day for 42.0 years (21.0 ttl pk-yrs)   Types: Cigarettes   Start date: 11/09/1977   Last attempt to quit: 11/10/2019   Years since quitting: 4.5  Smokeless Tobacco Never    Current Outpatient Medications on File Prior to  Visit  Medication Sig Dispense Refill   amiodarone  (PACERONE ) 200 MG tablet Take 1 tablet (200 mg total) by mouth daily. 30 tablet 2   aspirin  EC 81 MG tablet Take 81 mg by mouth daily. Swallow whole.     atorvastatin  (LIPITOR) 80 MG tablet Take 1 tablet (80 mg total) by mouth daily. 90 tablet 3   clopidogrel  (PLAVIX ) 75 MG tablet Take 1 tablet (75 mg total) by mouth daily. 90 tablet 3   ezetimibe  (ZETIA ) 10 MG tablet Take 1 tablet (10 mg total) by mouth daily. 90 tablet 3   metoprolol  succinate (TOPROL -XL) 50 MG 24 hr tablet Take 1 tablet (50 mg total) by mouth daily. Take with or immediately following a meal. 90 tablet 3   sacubitril -valsartan  (ENTRESTO ) 49-51 MG Take 1 tablet by mouth 2 (two) times daily. (Patient taking differently: Take 0.5 tablets by mouth 2 (two) times daily.)     spironolactone  (ALDACTONE ) 25 MG tablet Take 1 tablet (25 mg total) by mouth daily. 90 tablet 3   Tiotropium Bromide -Olodaterol (STIOLTO RESPIMAT ) 2.5-2.5 MCG/ACT AERS Inhale 2 puffs into the lungs daily. 16 g 3   torsemide  (DEMADEX ) 20 MG tablet Take 1 tablet (20 mg total) by mouth as needed. 90 tablet 3   No current facility-administered medications on file prior to visit.     ROS see history of present illness  Objective  Physical Exam Vitals:   05/27/24 1437  BP: 120/82  Pulse: (!) 58  Temp: 97.9 F (36.6 C)  SpO2: 99%  BP Readings from Last 3 Encounters:  05/27/24 120/82  04/27/24 120/70  04/26/24 126/84   Wt Readings from Last 3 Encounters:  05/27/24 197 lb 9.6 oz (89.6 kg)  04/27/24 197 lb 8 oz (89.6 kg)  04/26/24 197 lb 6.4 oz (89.5 kg)    Physical Exam Constitutional:      General: He is not in acute distress.    Appearance: Normal appearance.  HENT:     Head: Normocephalic.  Cardiovascular:     Rate and Rhythm: Normal rate and regular rhythm.     Heart sounds: Normal heart sounds.  Pulmonary:     Effort: Pulmonary effort is normal.     Breath sounds: Normal breath  sounds.  Skin:    General: Skin is warm and dry.  Neurological:     General: No focal deficit present.     Mental Status: He is alert.  Psychiatric:        Mood and Affect: Mood normal.        Behavior: Behavior normal.      Assessment/Plan: Please see individual problem list.  Ventricular tachycardia (HCC) Assessment & Plan: Managed with amiodarone  and metoprolol . An ICD generator change is scheduled for November 17th. Continue current medications. Order CBC and BMP labs for Cardiology. Follow up as scheduled.   Orders: -     CBC with Differential/Platelet -     Basic metabolic panel with GFR  Chronic systolic CHF (congestive heart failure) (HCC) Assessment & Plan: Managed with amiodarone , metoprolol , Entresto , spironolactone , and torsemide . Euvolemic on exam. Continue current medication regimen. Follow up with Cardiology.    Single implantable cardioverter-defibrillator-BSx Assessment & Plan: An ICD generator change is scheduled for November 17th. Continue current medications. Order CBC and BMP labs for Cardiology. Proceed with surgery as scheduled.   Orders: -     CBC with Differential/Platelet -     Basic metabolic panel with GFR  Anxiety Assessment & Plan: Chronic anxiety is managed with Xanax , though anxiety has increased post-cardiac events. Discussed minimizing Xanax  use due to dependency risks. Continue Xanax  0.5 mg up to three times daily as needed. Counseled on common side effects and potential effects of long term use. PDMP reviewed. Refills sent. We will continue to monitor.   Orders: -     ALPRAZolam ; Take 1 tablet (0.5 mg total) by mouth 3 (three) times daily as needed.  Dispense: 90 tablet; Refill: 5  Essential hypertension Assessment & Plan: Blood pressure is well controlled with metoprolol , Entresto  and spironolactone  daily. Continue current medication regimen.    Chronic bronchitis, unspecified chronic bronchitis type (HCC) Assessment & Plan: Well  managed on Stiolto 2 puff daily. Continue. Encourage tobacco cessation.    Mixed hyperlipidemia Assessment & Plan: Managed with Lipitor and Zetia . Continue current medication regimen.        Return in about 6 months (around 11/25/2024) for Follow up.   Leron Glance, NP-C [facility]

## 2024-05-27 NOTE — Assessment & Plan Note (Signed)
 Chronic anxiety is managed with Xanax , though anxiety has increased post-cardiac events. Discussed minimizing Xanax  use due to dependency risks. Continue Xanax  0.5 mg up to three times daily as needed. Counseled on common side effects and potential effects of long term use. PDMP reviewed. Refills sent. We will continue to monitor.

## 2024-05-27 NOTE — Assessment & Plan Note (Signed)
 Managed with amiodarone  and metoprolol . An ICD generator change is scheduled for November 17th. Continue current medications. Order CBC and BMP labs for Cardiology. Follow up as scheduled.

## 2024-05-28 ENCOUNTER — Ambulatory Visit: Payer: Self-pay | Admitting: Nurse Practitioner

## 2024-05-28 DIAGNOSIS — I10 Essential (primary) hypertension: Secondary | ICD-10-CM

## 2024-05-28 LAB — CBC WITH DIFFERENTIAL/PLATELET
Basophils Absolute: 0.1 K/uL (ref 0.0–0.1)
Basophils Relative: 1.2 % (ref 0.0–3.0)
Eosinophils Absolute: 0.1 K/uL (ref 0.0–0.7)
Eosinophils Relative: 1.5 % (ref 0.0–5.0)
HCT: 46.3 % (ref 39.0–52.0)
Hemoglobin: 15.5 g/dL (ref 13.0–17.0)
Lymphocytes Relative: 46.3 % — ABNORMAL HIGH (ref 12.0–46.0)
Lymphs Abs: 3.6 K/uL (ref 0.7–4.0)
MCHC: 33.5 g/dL (ref 30.0–36.0)
MCV: 90.9 fl (ref 78.0–100.0)
Monocytes Absolute: 0.6 K/uL (ref 0.1–1.0)
Monocytes Relative: 7.9 % (ref 3.0–12.0)
Neutro Abs: 3.3 K/uL (ref 1.4–7.7)
Neutrophils Relative %: 43.1 % (ref 43.0–77.0)
Platelets: 183 K/uL (ref 150.0–400.0)
RBC: 5.09 Mil/uL (ref 4.22–5.81)
RDW: 15.7 % — ABNORMAL HIGH (ref 11.5–15.5)
WBC: 7.8 K/uL (ref 4.0–10.5)

## 2024-05-28 LAB — BASIC METABOLIC PANEL WITH GFR
BUN: 20 mg/dL (ref 6–23)
CO2: 21 meq/L (ref 19–32)
Calcium: 9.4 mg/dL (ref 8.4–10.5)
Chloride: 103 meq/L (ref 96–112)
Creatinine, Ser: 1.08 mg/dL (ref 0.40–1.50)
GFR: 69.39 mL/min (ref >60.00)
Glucose, Bld: 75 mg/dL (ref 70–99)
Potassium: 4.4 meq/L (ref 3.5–5.1)
Sodium: 133 meq/L — ABNORMAL LOW (ref 135–145)

## 2024-06-04 ENCOUNTER — Ambulatory Visit: Payer: Self-pay | Admitting: Nurse Practitioner

## 2024-06-04 ENCOUNTER — Other Ambulatory Visit (INDEPENDENT_AMBULATORY_CARE_PROVIDER_SITE_OTHER)

## 2024-06-04 DIAGNOSIS — I10 Essential (primary) hypertension: Secondary | ICD-10-CM | POA: Diagnosis not present

## 2024-06-04 LAB — BASIC METABOLIC PANEL WITH GFR
BUN: 26 mg/dL — ABNORMAL HIGH (ref 6–23)
CO2: 23 meq/L (ref 19–32)
Calcium: 9.2 mg/dL (ref 8.4–10.5)
Chloride: 104 meq/L (ref 96–112)
Creatinine, Ser: 1.21 mg/dL (ref 0.40–1.50)
GFR: 60.54 mL/min (ref >60.00)
Glucose, Bld: 89 mg/dL (ref 70–99)
Potassium: 4.9 meq/L (ref 3.5–5.1)
Sodium: 136 meq/L (ref 135–145)

## 2024-06-07 ENCOUNTER — Encounter: Payer: Self-pay | Admitting: Emergency Medicine

## 2024-06-07 ENCOUNTER — Ambulatory Visit (INDEPENDENT_AMBULATORY_CARE_PROVIDER_SITE_OTHER): Payer: Medicare Other

## 2024-06-07 DIAGNOSIS — I472 Ventricular tachycardia, unspecified: Secondary | ICD-10-CM | POA: Diagnosis not present

## 2024-06-07 LAB — CUP PACEART REMOTE DEVICE CHECK
Battery Remaining Longevity: 3 mo
Battery Remaining Percentage: 2 %
Brady Statistic RV Percent Paced: 0 %
Date Time Interrogation Session: 20251110022100
HighPow Impedance: 84 Ohm
Implantable Lead Connection Status: 753985
Implantable Lead Implant Date: 20130503
Implantable Lead Location: 753860
Implantable Lead Model: 292
Implantable Lead Serial Number: 112568
Implantable Pulse Generator Implant Date: 20130503
Lead Channel Impedance Value: 652 Ohm
Lead Channel Pacing Threshold Amplitude: 0.4 V
Lead Channel Pacing Threshold Pulse Width: 0.5 ms
Lead Channel Setting Pacing Amplitude: 2.4 V
Lead Channel Setting Pacing Pulse Width: 0.5 ms
Lead Channel Setting Sensing Sensitivity: 0.6 mV
Pulse Gen Serial Number: 105232
Zone Setting Status: 755011

## 2024-06-08 ENCOUNTER — Encounter: Admitting: Cardiology

## 2024-06-10 NOTE — Progress Notes (Signed)
 Remote ICD Transmission

## 2024-06-13 ENCOUNTER — Ambulatory Visit: Payer: Self-pay | Admitting: Cardiology

## 2024-06-14 ENCOUNTER — Ambulatory Visit

## 2024-06-14 ENCOUNTER — Ambulatory Visit
Admission: RE | Admit: 2024-06-14 | Discharge: 2024-06-14 | Disposition: A | Discharge status: Home / Self Care | Attending: Cardiology | Admitting: Cardiology

## 2024-06-14 ENCOUNTER — Other Ambulatory Visit: Payer: Self-pay

## 2024-06-14 ENCOUNTER — Encounter
Admission: RE | Disposition: A | Discharge status: Home / Self Care | Payer: Self-pay | Source: Home / Self Care | Attending: Cardiology

## 2024-06-14 ENCOUNTER — Encounter: Payer: Self-pay | Admitting: Cardiology

## 2024-06-14 DIAGNOSIS — N189 Chronic kidney disease, unspecified: Secondary | ICD-10-CM | POA: Insufficient documentation

## 2024-06-14 DIAGNOSIS — Z79899 Other long term (current) drug therapy: Secondary | ICD-10-CM | POA: Insufficient documentation

## 2024-06-14 DIAGNOSIS — I255 Ischemic cardiomyopathy: Secondary | ICD-10-CM | POA: Insufficient documentation

## 2024-06-14 DIAGNOSIS — Z955 Presence of coronary angioplasty implant and graft: Secondary | ICD-10-CM | POA: Diagnosis not present

## 2024-06-14 DIAGNOSIS — I130 Hypertensive heart and chronic kidney disease with heart failure and stage 1 through stage 4 chronic kidney disease, or unspecified chronic kidney disease: Secondary | ICD-10-CM | POA: Insufficient documentation

## 2024-06-14 DIAGNOSIS — I471 Supraventricular tachycardia, unspecified: Secondary | ICD-10-CM | POA: Insufficient documentation

## 2024-06-14 DIAGNOSIS — Z7902 Long term (current) use of antithrombotics/antiplatelets: Secondary | ICD-10-CM | POA: Insufficient documentation

## 2024-06-14 DIAGNOSIS — I2510 Atherosclerotic heart disease of native coronary artery without angina pectoris: Secondary | ICD-10-CM | POA: Insufficient documentation

## 2024-06-14 DIAGNOSIS — Z7982 Long term (current) use of aspirin: Secondary | ICD-10-CM | POA: Insufficient documentation

## 2024-06-14 DIAGNOSIS — Z4502 Encounter for adjustment and management of automatic implantable cardiac defibrillator: Secondary | ICD-10-CM | POA: Insufficient documentation

## 2024-06-14 DIAGNOSIS — I472 Ventricular tachycardia, unspecified: Secondary | ICD-10-CM | POA: Diagnosis not present

## 2024-06-14 DIAGNOSIS — Z95810 Presence of automatic (implantable) cardiac defibrillator: Secondary | ICD-10-CM | POA: Diagnosis not present

## 2024-06-14 DIAGNOSIS — I5022 Chronic systolic (congestive) heart failure: Secondary | ICD-10-CM | POA: Diagnosis not present

## 2024-06-14 DIAGNOSIS — Z45010 Encounter for checking and testing of cardiac pacemaker pulse generator [battery]: Secondary | ICD-10-CM

## 2024-06-14 HISTORY — PX: ICD GENERATOR CHANGEOUT: EP1231

## 2024-06-14 HISTORY — DX: Calculus of kidney: N20.0

## 2024-06-14 SURGERY — ICD GENERATOR CHANGEOUT
Anesthesia: Moderate Sedation

## 2024-06-14 MED ORDER — FENTANYL CITRATE (PF) 100 MCG/2ML IJ SOLN
INTRAMUSCULAR | Status: AC
  Filled 2024-06-14: qty 2

## 2024-06-14 MED ORDER — CHLORHEXIDINE GLUCONATE 4 % EX SOLN
4.0000 | Freq: Once | CUTANEOUS | Status: AC
  Administered 2024-06-14: 4 via TOPICAL

## 2024-06-14 MED ORDER — SODIUM CHLORIDE 0.9 % IV SOLN
80.0000 mg | INTRAVENOUS | Status: DC
  Filled 2024-06-14: qty 2

## 2024-06-14 MED ORDER — MIDAZOLAM HCL 2 MG/2ML IJ SOLN
INTRAMUSCULAR | Status: AC
  Filled 2024-06-14: qty 2

## 2024-06-14 MED ORDER — CEFAZOLIN SODIUM-DEXTROSE 2-4 GM/100ML-% IV SOLN
INTRAVENOUS | Status: AC
  Filled 2024-06-14: qty 100

## 2024-06-14 MED ORDER — FENTANYL CITRATE (PF) 100 MCG/2ML IJ SOLN
INTRAMUSCULAR | Status: DC | PRN
  Administered 2024-06-14 (×2): 50 ug via INTRAVENOUS

## 2024-06-14 MED ORDER — SODIUM CHLORIDE 0.9 % IV SOLN
INTRAVENOUS | Status: DC | PRN
  Administered 2024-06-14: 80 mg

## 2024-06-14 MED ORDER — SODIUM CHLORIDE 0.9 % IV SOLN: INTRAVENOUS | Status: DC

## 2024-06-14 MED ORDER — HEPARIN (PORCINE) IN NACL 1000-0.9 UT/500ML-% IV SOLN
INTRAVENOUS | Status: DC | PRN
Start: 2024-06-14 — End: 2024-06-14
  Administered 2024-06-14: 500 mL

## 2024-06-14 MED ORDER — ACETAMINOPHEN 325 MG PO TABS: 325.0000 mg | ORAL_TABLET | ORAL | Status: DC | PRN

## 2024-06-14 MED ORDER — MIDAZOLAM HCL (PF) 2 MG/2ML IJ SOLN
INTRAMUSCULAR | Status: DC | PRN
  Administered 2024-06-14 (×2): 1 mg via INTRAVENOUS

## 2024-06-14 MED ORDER — CEFAZOLIN SODIUM-DEXTROSE 2-4 GM/100ML-% IV SOLN: 2.0000 g | INTRAVENOUS | Status: DC

## 2024-06-14 MED ORDER — CEFAZOLIN SODIUM-DEXTROSE 1-4 GM/50ML-% IV SOLN
INTRAVENOUS | Status: DC | PRN
  Administered 2024-06-14: 2 g via INTRAVENOUS

## 2024-06-14 MED ORDER — IOHEXOL 300 MG/ML  SOLN
INTRAMUSCULAR | Status: DC | PRN
  Administered 2024-06-14: 25 mL

## 2024-06-14 MED ORDER — LIDOCAINE HCL (PF) 1 % IJ SOLN
INTRAMUSCULAR | Status: AC
Start: 2024-06-14 — End: 2024-06-14
  Filled 2024-06-14: qty 60

## 2024-06-14 MED ORDER — LIDOCAINE HCL (PF) 1 % IJ SOLN
INTRAMUSCULAR | Status: DC | PRN
  Administered 2024-06-14: 30 mL

## 2024-06-14 MED ORDER — POVIDONE-IODINE 10 % EX SWAB
2.0000 | Freq: Once | CUTANEOUS | Status: AC
  Administered 2024-06-14: 2 via TOPICAL

## 2024-06-14 MED ORDER — ONDANSETRON HCL 4 MG/2ML IJ SOLN: 4.0000 mg | Freq: Four times a day (QID) | INTRAMUSCULAR | Status: DC | PRN

## 2024-06-14 SURGICAL SUPPLY — 10 items
CABLE SURG 12 DISP A/V CHANNEL (MISCELLANEOUS) IMPLANT
DEVICE DSSCT PLSMBLD 3.0S LGHT (MISCELLANEOUS) IMPLANT
ICD VIGILANT DR D233 (Pacemaker) IMPLANT
KIT MICROPUNCTURE VSI 5F STIFF (SHEATH) IMPLANT
LEAD INGEVITY 7841 52 (Lead) IMPLANT
PAD ELECT DEFIB RADIOL ZOLL (MISCELLANEOUS) IMPLANT
POUCH AIGIS-R ANTIBACT ICD LRG (Mesh General) IMPLANT
SHEATH 7FR PRELUDE SNAP 13 (SHEATH) IMPLANT
SUT ETHIBOND CT1 BRD #0 30IN (SUTURE) IMPLANT
TRAY PACEMAKER INSERTION (PACKS) ×1 IMPLANT
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
## 2024-06-14 NOTE — Discharge Instructions (Addendum)
 After Your Pacemaker   You have a Environmental Education Officer  If you have a Medtronic or Biotronik device, plug in your home monitor once you get home, and no manual interaction is required.   If you have an Abbott or Autozone device, plug your home monitor once you get home, sit near the device, and press the large activation button. Sit nearby until the process is complete, usually notated by lights on the monitor.   If you were set up for monitoring using an app on your phone, make sure the app remains open in the background and the Bluetooth remains on.  ACTIVITY Do not lift your arm above shoulder height for 1 week after your procedure. After 7 days, you may progress as below.  You should remove your sling 24 hours after your procedure, unless otherwise instructed by your provider.     Monday June 21, 2024  Tuesday June 22, 2024 Wednesday June 23, 2024 Thursday June 24, 2024   Do not lift, push, pull, or carry anything over 10 pounds with the affected arm until 6 weeks (Monday July 26, 2024 ) after your procedure.   You may drive AFTER your wound check, unless you have been told otherwise by your provider.   Ask your healthcare provider when you can go back to work   INCISION/Dressing Continue aspirin . Resume Plavix  on 06/17/24.   If large square, outer bandage is left in place, this can be removed after 24 hours from your procedure. Do not remove steri-strips or glue as below.   If a PRESSURE DRESSING (a bulky dressing that usually goes up over your shoulder) was applied or left in place, please follow instructions given by your provider on when to return to have this removed.   Monitor your Pacemaker site for redness, swelling, and drainage. Call the device clinic at [phone] if you experience these symptoms or fever/chills.  If your incision is sealed with Steri-strips or staples, you may shower 7 days after your procedure or when told by  your provider. Do not remove the steri-strips or let the shower hit directly on your site. You may wash around your site with soap and water .    If you were discharged in a sling, please do not wear this during the day more than 48 hours after your surgery unless otherwise instructed. This may increase the risk of stiffness and soreness in your shoulder.   Avoid lotions, ointments, or perfumes over your incision until it is well-healed.  You may use a hot tub or a pool AFTER your wound check appointment if the incision is completely closed.  Pacemaker Alerts:  Some alerts are vibratory and others beep. These are NOT emergencies. Please call our office to let us  know. If this occurs at night or on weekends, it can wait until the next business day. Send a remote transmission.  If your device is capable of reading fluid status (for heart failure), you will be offered monthly monitoring to review this with you.   DEVICE MANAGEMENT Remote monitoring is used to monitor your pacemaker from home. This monitoring is scheduled every 91 days by our office. It allows us  to keep an eye on the functioning of your device to ensure it is working properly. You will routinely see your Electrophysiologist annually (more often if necessary).  This will appear as a REMOTE check on your MyChart schedule. These are automatic and there is nothing for you to manually do unless otherwise instructed.  You should receive your ID card for your new device in 4-8 weeks. Keep this card with you at all times once received. Consider wearing a medical alert bracelet or necklace.  Your Pacemaker may be MRI compatible. This will be discussed at your next office visit/wound check.  You should avoid contact with strong electric or magnetic fields.   Do not use amateur (ham) radio equipment or electric (arc) welding torches. MP3 player headphones with magnets should not be used. Some devices are safe to use if held at least 12 inches  (30 cm) from your Pacemaker. These include power tools, lawn mowers, and speakers. If you are unsure if something is safe to use, ask your health care provider.  When using your cell phone, hold it to the ear that is on the opposite side from the Pacemaker. Do not leave your cell phone in a pocket over the Pacemaker.  You may safely use electric blankets, heating pads, computers, and microwave ovens.  Call the office right away if: You have chest pain. You feel more short of breath than you have felt before. You feel more light-headed than you have felt before. Your incision starts to open up.  This information is not intended to replace advice given to you by your health care provider. Make sure you discuss any questions you have with your health care provider.

## 2024-06-15 ENCOUNTER — Encounter: Payer: Self-pay | Admitting: Cardiology

## 2024-06-16 ENCOUNTER — Encounter: Payer: Self-pay | Admitting: Cardiology

## 2024-06-22 ENCOUNTER — Telehealth: Payer: Self-pay

## 2024-06-22 NOTE — Telephone Encounter (Signed)
 Follow-up after same day discharge: Implant date: 06/14/2024 MD: JP Device: ICD Location: L chest    Wound check visit: 06/30/2024 90 day MD follow-up: 06/30/2024  Remote Transmission received:yes  Dressing/sling removed: yes  Confirm OAC restart on: yes  Please continue to monitor your cardiac device site for redness, swelling, and drainage. Call the device clinic at [phone] if you experience these symptoms, fever/chills, or have questions about your device.   Remote monitoring is used to monitor your cardiac device from home. This monitoring is scheduled every 91 days by our office. It allows us  to keep an eye on the functioning of your device to ensure it is working properly.

## 2024-06-27 NOTE — Progress Notes (Unsigned)
 Date:  06/28/2024   ID:  Lynwood FORBES Saba, DOB 05/14/1956, MRN [id number]  Patient Location:  [address] [place] [address]   Provider location:   [facility], [place] office  PCP:  Gretel App, NP  Cardiologist:  Perla [facility]  Chief Complaint  Patient presents with   6 month follow up     Patient denies chest pain or shortness of breath.  Patient is having some sinus congestion x 3 days.     History of Present Illness:    FADY STAMPS is a 68 y.o. male past medical history of Smoking, continues to smoke 1 pack a week coronary artery disease,  3 stents placed to his mid LAD, one stent to the PDA, one stent to the OM 3 in 08/2006  chest pressure during his divorce with stress test at [place],  cardiac catheterization at [facility], 11/2011 syncope x4 episodes total over the past several years ECHO EF 25 to 30%, 2015 ICD, ejection fraction 30-35%, chronic back and hip pain ischemic cardiomyopathy He presents today for follow-up of his coronary artery disease,  Heart catheterization , syncope  Last seen in clinic by myself 3/24 Seen by EP April 27, 2024  Discussed recent events multiple ICD shocks, with two occurring at home and one while in his truck, leading to an admission to [facility] where he was restarted on amiodarone .   Off farxiga  for UTI  Device end-of-life, new ICD with Dr. Kennyth 06/14/24 Bandage still in place Scheduled for bandage removal and wound check tomorrow in [place] He prefers to have this done in [place] if possible  Otherwise reports that he feels well, has chronic sinus issues Lives in [place], prefers to come to [place] for his appointments  Other past medical history reviewed catheterization early 2021 1. Significant underlying three-vessel coronary artery disease with chronically occluded mid LAD, patent first diagonal stent, patent RCA stents with mild in-stent restenosis, patent  left circumflex stent with moderate in-stent restenosis.  Progression of ostial left circumflex stenosis to 60%. 2.  Left ventricular angiography was not performed due to chronic kidney disease. 3.  Moderately elevated left ventricular end-diastolic pressure between 25 to 30 mmHg. No  revascularization recommended  Quit smoking 08/2020  Stress test August 10, 2019 showing fixed defects anterior wall   Past Medical History:  Diagnosis Date   Acute anterior wall MI (HCC) 07/04/1991   a.) LHC: EF 29%; 100% mLAD -> PTCA performed   AICD (automatic cardioverter/defibrillator) present 12/01/2011   a.) single chamber [employer] AICD  (SN: [id number])   Anxiety    Aortic atherosclerosis    Baker's cyst 06/10/2019   5.3 cm 01/2018 noted US  no repair as of 06/10/2019      Bilateral carotid artery disease    Cellulitis of left foot 05/10/2022   COPD (chronic obstructive pulmonary disease) (HCC)    Coronary artery disease    a.) 3 stents --> 3x18 mm Duet to OM3, 2.5x18 mm Duet to D1, 2.5x13 mm Duet to RPDA. b. 06/2013 PCI: CTO LAD, patent LAD stent, RCA 70p (FFR 0.74--> DES), 62m (DES), RPDA 50%, EF 20%; c. 08/2019 Cath: LM nl, LAD 100p/m, D1 60, D2 patent stent, LCX 60ost/p, 28m ISR, RCA 30p, 77m ISR.   Dizziness 03/30/2015   Family history of lung cancer    HFrEF (heart failure with reduced ejection fraction) (HCC)    a. 05/2014 Echo: EF 25-30%; b. 01/2018  Echo ([facility]): EF 30%; c. 06/2021 Echo: EF 25-30%, apical AK, GrI DD, nl RV fxn.   HLD (hyperlipidemia)    HTN (hypertension)    Insomnia 06/01/2014   Ischemic cardiomyopathy    a. 05/2014 Echo: EF 25-30%; b. 01/2018 Echo ([facility]): EF 30%, c. 06/2021 Echo: EF 25-30%.   Kidney stone    right   LAFB (left anterior fascicular block)    Lateral wall myocardial infarction (HCC) 06/29/1997   a.) LHC: EF 44%; 75% RPDA and 95% OM3; PCI performed and a 3.0 x 18 mm Duet stent placed to OM3   Leukocytosis 08/16/2022   Long term current use of  clopidogrel     Long-term use of aspirin  therapy    Nephrolithiasis    NSTEMI (non-ST elevated myocardial infarction) (HCC) 11/27/2011   a.) LHC: 100% mLAD; intervention deferred opting for medical management.   Personal history of colonic polyps    Pneumonia 08/2018   Pulmonary hypertension (HCC)    Sacroiliac pain 07/31/2016   Sepsis due to group A Streptococcus with acute renal failure and septic shock (HCC) 2023   Syncope and collapse    Tobacco abuse 06/13/2016   Ventricular tachycardia (HCC) 12/01/2011   a.) VT arrest; ROSC achieved --> [employer] AICD placed.   Past Surgical History:  Procedure Laterality Date   APPENDECTOMY N/A    CARDIAC DEFIBRILLATOR PLACEMENT N/A 12/01/2011   CATARACT EXTRACTION W/PHACO Left 08/06/2023   Procedure: CATARACT EXTRACTION PHACO AND INTRAOCULAR LENS PLACEMENT (IOC) LEFT 4.61, 00:28.3;  Surgeon: Mittie Gaskin, MD;  Location: [facility];  Service: Ophthalmology;  Laterality: Left;   COLONOSCOPY WITH PROPOFOL  N/A 12/22/2020   Procedure: COLONOSCOPY WITH PROPOFOL ;  Surgeon: Therisa Bi, MD;  Location: [facility] END[facility];  Service: Gastroenterology;  Laterality: N/A;   CORONARY ANGIOPLASTY Left 07/05/1991   Procedure: CORONARY ANGIOPLASTY; Location: [facility]; Surgeon: Miquel Ellen, MD   CORONARY ANGIOPLASTY Left 08/06/1991   Procedure: CORONARY ANGIOPLASTY; Location: [facility]; Surgeon: Steffan Score, MD   CORONARY ANGIOPLASTY Left 08/09/1991   Procedure: CORONARY ANGIOPLASTY; Location: [facility]; Surgeon: Steffan Score, MD   CORONARY ANGIOPLASTY Left 10/28/1991   Procedure: CORONARY ANGIOPLASTY; Location: [facility]; Surgeon: Steffan Score, MD   CORONARY ANGIOPLASTY Left 11/02/1991   Procedure: CORONARY ANGIOPLASTY; Location: [facility]; Surgeon: Steffan Score, MD   CORONARY ANGIOPLASTY Left 01/10/1992   Procedure: CORONARY ANGIOPLASTY; Location: [facility]; Surgeon: Charlie Ross, MD   CORONARY ANGIOPLASTY Left 09/26/1993   Procedure: CORONARY ANGIOPLASTY;  Location: [facility]; Surgeon: Charlie Hasten, MD   CORONARY ANGIOPLASTY Left 09/11/2006   Procedure: CORONARY ANGIOPLASTY; Location: [facility]   CORONARY ANGIOPLASTY Left 01/17/2010   Procedure: CORONARY ANGIOPLASTY; Location: [facility]; Surgeon: Prentice Risk, MD   CORONARY ANGIOPLASTY Left 11/27/2011   Procedure: CORONARY ANGIOPLASTY; Location: [facility]; Surgeon: Prentice Flatten, MD   CORONARY ANGIOPLASTY WITH STENT PLACEMENT Left 06/29/1997   Procedure: CORONARY ANGIOPLASTY WITH STENT PLACEMENT (3.0 x 18 mm Diet stent to OM3); Location: [facility]; Surgeon: Wyatte Zidar, MD   CORONARY ANGIOPLASTY WITH STENT PLACEMENT Left 07/12/1998   Procedure: CORONARY ANGIOPLASTY WITH STENT PLACEMENT (2.8 x 8 mm Duet stent to D1, 2.5 x 13 mm Duet stent to RPDA); Location: [facility]   CYSTOSCOPY/URETEROSCOPY/HOLMIUM LASER/STENT PLACEMENT Right 04/05/2024   Procedure: CYSTOSCOPY/URETEROSCOPY/HOLMIUM LASER/STENT PLACEMENT;  Surgeon: Francisca Redell BROCKS, MD;  Location: [facility] ORS;  Service: Urology;  Laterality: Right;   GRAFT APPLICATION Left 08/24/2021   Procedure: GRAFT APPLICATION;  Surgeon: Tye Millet, DO;  Location: [facility] ORS;  Service: General;  Laterality: Left;   ICD GENERATOR CHANGEOUT N/A 06/14/2024  Procedure: ICD GENERATOR CHANGEOUT;  Surgeon: Kennyth Chew, MD;  Location[facility] INVAS[facility] CV LAB;  Service: Cardiovascular;  Laterality: N/A;   LEFT HEART CATH AND CORONARY ANGIOGRAPHY Left 08/30/2019   Procedure: LEFT HEART CATH AND CORONARY ANGIOGRAPHY;  Surgeon: Darron Deatrice LABOR, MD;  Location: [facility] INVASIVE CV LAB;  Service: Cardiovascular;  Laterality: Left;   LITHOTRIPSY Right 2025   PROSTATE SURGERY     urethra grew around prostate    TUMOR REMOVAL  2001   chest thymic cyst; behind lungs [facility] benign      Allergies:   Nitroglycerin    Social History   Tobacco Use   Smoking status: Former    Current packs/day: 0.00    Average packs/day: 0.5 packs/day for 42.0 years (21.0 ttl pk-yrs)    Types: Cigarettes    Start date:  11/09/1977    Quit date: 11/10/2019    Years since quitting: 4.6   Smokeless tobacco: Never  Vaping Use   Vaping status: Never Used  Substance Use Topics   Alcohol  use: Yes    Alcohol /week: 12.0 standard drinks of alcohol     Types: 12 Cans of beer per week    Comment: Rare   Drug use: No     Current Outpatient Medications on File Prior to Visit  Medication Sig Dispense Refill   ALPRAZolam  (XANAX ) 0.5 MG tablet Take 1 tablet (0.5 mg total) by mouth 3 (three) times daily as needed. 90 tablet 5   amiodarone  (PACERONE ) 200 MG tablet Take 1 tablet (200 mg total) by mouth daily. 30 tablet 2   aspirin  EC 81 MG tablet Take 81 mg by mouth daily. Swallow whole.     atorvastatin  (LIPITOR) 40 MG tablet Take 40 mg by mouth daily.     clopidogrel  (PLAVIX ) 75 MG tablet Take 1 tablet (75 mg total) by mouth daily. 90 tablet 3   DM-Phenylephrine -Acetaminophen  (ALKA-SELTZER PLS SINUS & COUGH) 10-5-325 MG CAPS Take by mouth daily.     ezetimibe  (ZETIA ) 10 MG tablet Take 1 tablet (10 mg total) by mouth daily. 90 tablet 3   metoprolol  succinate (TOPROL -XL) 25 MG 24 hr tablet Take 25 mg by mouth 2 (two) times daily.     potassium chloride  (KLOR-CON ) 10 MEQ tablet Take 10 mEq by mouth daily.     sacubitril -valsartan  (ENTRESTO ) 49-51 MG Take 1 tablet by mouth 2 (two) times daily.     spironolactone  (ALDACTONE ) 25 MG tablet Take 1 tablet (25 mg total) by mouth daily. 90 tablet 3   Tiotropium Bromide -Olodaterol (STIOLTO RESPIMAT ) 2.5-2.5 MCG/ACT AERS Inhale 2 puffs into the lungs daily. 16 g 3   torsemide  (DEMADEX ) 20 MG tablet Take 1 tablet (20 mg total) by mouth as needed. 90 tablet 3   No current facility-administered medications on file prior to visit.     Family Hx: The patient's family history includes Cancer in his father; Cancer - Lung in his mother; Dementia in his maternal grandmother; Heart attack in his mother and paternal grandfather; Hypertension in his mother. There is no history of Colon cancer,  Stomach cancer, or Pancreatic cancer.  ROS:   Please see the history of present illness.    Review of Systems  Constitutional: Negative.   HENT: Negative.    Respiratory: Negative.    Cardiovascular: Negative.   Gastrointestinal: Negative.   Musculoskeletal: Negative.   Neurological: Negative.   Psychiatric/Behavioral: Negative.    All other systems reviewed and are negative.    Labs/Other Tests and Data Reviewed:  Recent Labs: 04/26/2024: Magnesium  2.0 05/27/2024: Hemoglobin 15.5; Platelets 183.0 06/04/2024: BUN 26; Creatinine, Ser 1.21; Potassium 4.9; Sodium 136   Recent Lipid Panel Lab Results  Component Value Date/Time   CHOL 130 06/30/2023 02:30 PM   CHOL 111 01/20/2014 09:29 AM   TRIG 87 06/30/2023 02:30 PM   TRIG 88 01/20/2014 09:29 AM   HDL 45 06/30/2023 02:30 PM   HDL 39 (L) 01/20/2014 09:29 AM   CHOLHDL 2.9 06/30/2023 02:30 PM   CHOLHDL 3.1 12/11/2022 11:28 AM   LDLCALC 68 06/30/2023 02:30 PM   LDLCALC 72 03/03/2020 02:17 PM   LDLCALC 54 01/20/2014 09:29 AM    Wt Readings from Last 3 Encounters:  06/28/24 204 lb 6 oz (92.7 kg)  06/14/24 196 lb (88.9 kg)  05/27/24 197 lb 9.6 oz (89.6 kg)     Exam:    Vital Signs: Vital signs may also be detailed in the HPI BP 122/70 (BP Location: Left Arm, Patient Position: Sitting, Cuff Size: Normal)   Pulse (!) 53   Ht 5' 8 (1.727 m)   Wt 204 lb 6 oz (92.7 kg)   SpO2 99%   BMI 31.08 kg/m   Constitutional:  oriented to person, place, and time. No distress.  HENT:  Head: Grossly normal Eyes:  no discharge. No scleral icterus.  Neck: No JVD, no carotid bruits  Cardiovascular: Regular rate and rhythm, no murmurs appreciated Pulmonary/Chest: Clear to auscultation bilaterally, no wheezes or rales Abdominal: Soft.  no distension.  no tenderness.  Musculoskeletal: Normal range of motion Neurological:  normal muscle tone. Coordination normal. No atrophy Skin: Skin warm and dry Psychiatric: normal affect,  pleasant  ASSESSMENT & PLAN:    Problem List Items Addressed This Visit       Cardiology Problems   Chronic systolic CHF (congestive heart failure) (HCC)   Ventricular tachycardia (HCC) - Primary   Ischemic cardiomyopathy   Other Visit Diagnoses       ICD (implantable cardioverter-defibrillator) in place         Coronary artery disease involving native coronary artery of native heart without angina pectoris         Primary hypertension         Hyperlipidemia LDL goal <70          Ischemic cardiomyopathy Workup at [facility], echo September 2025 EF 20% Elevated right heart pressures 53 mmHg Left heart catheterization detailing: 1. Dominant RCA with patent stent in the mid section. Proximal 30%. There appears to be a moderate or more stenosis in the distal RCA, but iFR is normal at 0.99.  2. 40% ostial circumflex. Patent stent in the mid circumflex  3. Subtotal occlusion of the proximal LAD just distal to D-1. D-1 has a ostial 40% narrowing and patent stent just distal. The LAD appears to be unchanged from a cath performed at [facility] in 2013.  Recommend he continue current medications including metoprolol  succinate, Entresto , spironolactone , torsemide  as needed Not on Farxiga  in the setting of urinary tract infection Appears euvolemic  Essential hypertension Blood pressure is well controlled on today's visit. No changes made to the medications.  History of VT several shocks September 2025 workup at [facility] with echo and cardiac catheterization Loaded with amiodarone  Followed by EP, Dr. Kennyth Will try to arrange follow-up in [place] at his request  Hyperlipidemia Cholesterol is at goal on the current lipid regimen. No changes to the medications were made.  Coronary artery disease with stable angina Stable coronary disease by catheterization  at [facility] April 08, 2024 No intervention performed Subtotal occlusion of proximal LAD distal to D1   Signed, Evalene Lunger, MD   [facility] [address], [place], [place] [address]

## 2024-06-28 ENCOUNTER — Encounter: Payer: Self-pay | Admitting: Cardiovascular Disease

## 2024-06-28 ENCOUNTER — Ambulatory Visit: Attending: Cardiovascular Disease | Admitting: Cardiovascular Disease

## 2024-06-28 ENCOUNTER — Telehealth: Payer: Self-pay | Admitting: Pharmacy Technician

## 2024-06-28 ENCOUNTER — Other Ambulatory Visit (HOSPITAL_COMMUNITY): Payer: Self-pay

## 2024-06-28 VITALS — BP 122/70 | HR 53 | Ht 68.0 in | Wt 204.4 lb

## 2024-06-28 DIAGNOSIS — I5022 Chronic systolic (congestive) heart failure: Secondary | ICD-10-CM | POA: Insufficient documentation

## 2024-06-28 DIAGNOSIS — Z95810 Presence of automatic (implantable) cardiac defibrillator: Secondary | ICD-10-CM | POA: Insufficient documentation

## 2024-06-28 DIAGNOSIS — E785 Hyperlipidemia, unspecified: Secondary | ICD-10-CM | POA: Diagnosis not present

## 2024-06-28 DIAGNOSIS — I255 Ischemic cardiomyopathy: Secondary | ICD-10-CM | POA: Diagnosis not present

## 2024-06-28 DIAGNOSIS — I472 Ventricular tachycardia, unspecified: Secondary | ICD-10-CM | POA: Diagnosis not present

## 2024-06-28 DIAGNOSIS — I2510 Atherosclerotic heart disease of native coronary artery without angina pectoris: Secondary | ICD-10-CM | POA: Insufficient documentation

## 2024-06-28 DIAGNOSIS — I10 Essential (primary) hypertension: Secondary | ICD-10-CM | POA: Insufficient documentation

## 2024-06-28 MED ORDER — ATORVASTATIN CALCIUM 40 MG PO TABS: 40.0000 mg | ORAL_TABLET | Freq: Every day | ORAL | 3 refills | Status: AC

## 2024-06-28 MED ORDER — AMIODARONE HCL 200 MG PO TABS: 200.0000 mg | ORAL_TABLET | Freq: Every day | ORAL | 2 refills | Status: AC

## 2024-06-28 MED ORDER — METOPROLOL SUCCINATE ER 25 MG PO TB24: 25.0000 mg | ORAL_TABLET | Freq: Two times a day (BID) | ORAL | 3 refills | Status: DC

## 2024-06-28 NOTE — Telephone Encounter (Signed)
 Under diagnosis verification

## 2024-06-28 NOTE — Patient Instructions (Addendum)
 Medication Instructions:   No changes  Needs help with entresto  2026,  We will send message to pharmacy for assistance  If you need a refill on your cardiac medications before your next appointment, please call your pharmacy.   Lab work: No new labs needed  Testing/Procedures: No new testing needed  Follow-Up: At [facility], you and your health needs are our priority.  As part of our continuing mission to provide you with exceptional heart care, we have created designated Provider Care Teams.  These Care Teams include your primary Cardiologist (physician) and Advanced Practice Providers (APPs -  Physician Assistants and Nurse Practitioners) who all work together to provide you with the care you need, when you need it.  You will need a follow up appointment in 12 months  Providers on your designated Care Team:   Lonni Meager, NP Bernardino Bring, PA-C Cadence [place], [place]  COVID-19 Vaccine Information can be found at: [email or web address] For questions related to vaccine distribution or appointments, please email [email or web address] or call [phone].

## 2024-06-28 NOTE — Telephone Encounter (Signed)
 Patient Advocate Encounter   The patient was approved for a [employer] grant that will help cover the cost of Entresto  BRAND Total amount awarded, 7500.00.  Effective: 05/29/24 - 05/28/25   APW:[id number] ERW:[id number] Hmnle:[id number] PI:[id number]  [employer] ID: [id number]   Pharmacy provided with approval and processing information. Patient informed via my chart- once approved fully

## 2024-06-29 ENCOUNTER — Ambulatory Visit

## 2024-06-29 NOTE — Progress Notes (Unsigned)

## 2024-06-30 ENCOUNTER — Ambulatory Visit: Attending: Cardiology | Admitting: Cardiology

## 2024-06-30 ENCOUNTER — Encounter: Payer: Self-pay | Admitting: Cardiology

## 2024-06-30 VITALS — BP 126/80 | HR 60 | Ht 68.0 in | Wt 205.0 lb

## 2024-06-30 DIAGNOSIS — Z95810 Presence of automatic (implantable) cardiac defibrillator: Secondary | ICD-10-CM | POA: Insufficient documentation

## 2024-06-30 DIAGNOSIS — I472 Ventricular tachycardia, unspecified: Secondary | ICD-10-CM | POA: Diagnosis not present

## 2024-06-30 DIAGNOSIS — Z5181 Encounter for therapeutic drug level monitoring: Secondary | ICD-10-CM

## 2024-06-30 DIAGNOSIS — I255 Ischemic cardiomyopathy: Secondary | ICD-10-CM | POA: Insufficient documentation

## 2024-06-30 LAB — CUP PACEART INCLINIC DEVICE CHECK
Date Time Interrogation Session: 20251203112500
HighPow Impedance: 61 Ohm
Implantable Lead Connection Status: 753985
Implantable Lead Connection Status: 753985
Implantable Lead Implant Date: 20130503
Implantable Lead Implant Date: 20251117
Implantable Lead Location: 753859
Implantable Lead Location: 753860
Implantable Lead Model: 292
Implantable Lead Model: 7841
Implantable Lead Serial Number: 112568
Implantable Lead Serial Number: 1680286
Implantable Pulse Generator Implant Date: 20251117
Lead Channel Impedance Value: 540 Ohm
Lead Channel Impedance Value: 581 Ohm
Lead Channel Pacing Threshold Amplitude: 0.6 V
Lead Channel Pacing Threshold Amplitude: 0.7 V
Lead Channel Pacing Threshold Pulse Width: 0.4 ms
Lead Channel Pacing Threshold Pulse Width: 0.4 ms
Lead Channel Sensing Intrinsic Amplitude: 2.6 mV
Lead Channel Sensing Intrinsic Amplitude: 24.7 mV
Lead Channel Setting Pacing Amplitude: 3.5 V
Lead Channel Setting Pacing Amplitude: 3.5 V
Lead Channel Setting Pacing Pulse Width: 0.4 ms
Lead Channel Setting Sensing Sensitivity: 0.5 mV
Pulse Gen Serial Number: 715314
Zone Setting Status: 755011

## 2024-06-30 NOTE — Patient Instructions (Signed)
 Medication Instructions:  Your physician recommends that you continue on your current medications as directed. Please refer to the Current Medication list given to you today.  *If you need a refill on your cardiac medications before your next appointment, please call your pharmacy*  Lab Work: No labs ordered today    Testing/Procedures:  Device wound care:  OK to shower Let warm soapy water  run down incision Do not scrub incision Pat dry with towel  Keep incision open to air - no ointments, creams, salves, or bandages.  Call device clinic if incision opens, has drainage, or begins to hurt more.    Follow-Up: At [facility], you and your health needs are our priority.  As part of our continuing mission to provide you with exceptional heart care, our providers are all part of one team.  This team includes your primary Cardiologist (physician) and Advanced Practice Providers or APPs (Physician Assistants and Nurse Practitioners) who all work together to provide you with the care you need, when you need it.  Your next appointment:    Tuesday 09/14/2024 at 10:40 am with Suzann Riddle, NP

## 2024-07-01 NOTE — Telephone Encounter (Signed)
 Grant passed     SENT TO [facility]

## 2024-07-04 ENCOUNTER — Ambulatory Visit: Payer: Self-pay | Admitting: Cardiology

## 2024-07-08 ENCOUNTER — Telehealth: Payer: Self-pay

## 2024-07-08 ENCOUNTER — Encounter

## 2024-07-08 NOTE — Telephone Encounter (Signed)
 Patient assistance forms for Stiolto Respimat  placed in provider to be signed folder

## 2024-07-12 NOTE — Telephone Encounter (Signed)
 Received Provider portion PAP application for Stiolto ([employer]).

## 2024-07-12 NOTE — Telephone Encounter (Signed)
 PAP: Application for Stioloto has been submitted to [employer]), via fax

## 2024-07-19 NOTE — Telephone Encounter (Signed)
 PAP: Patient assistance application for Stioloto has been approved by PAP Companies: [employer] from 07/29/2024 to 07/13/2025. Medication should be delivered to PAP Delivery: Home. For further shipping updates, please contact [employer] ([employer]) at [phone]. Patient ID is: [id number]

## 2024-08-03 ENCOUNTER — Ambulatory Visit (INDEPENDENT_AMBULATORY_CARE_PROVIDER_SITE_OTHER): Admitting: *Deleted

## 2024-08-03 VITALS — Ht 68.0 in | Wt 205.0 lb

## 2024-08-03 DIAGNOSIS — Z0000 Encounter for general adult medical examination without abnormal findings: Secondary | ICD-10-CM | POA: Diagnosis not present

## 2024-08-03 NOTE — Patient Instructions (Signed)
" °  Mr. Maners,  Thank you for taking the time for your Medicare Wellness Visit. I appreciate your continued commitment to your health goals. Please review the care plan we discussed, and feel free to reach out if I can assist you further.  Please note that Annual Wellness Visits do not include a physical exam. Some assessments may be limited, especially if the visit was conducted virtually. If needed, we may recommend an in-person follow-up with your provider.  Ongoing Care Seeing your primary care provider every 3 to 6 months helps us  monitor your health and provide consistent, personalized care.  Consider updating your flu, shingles and covid vaccines.   Referrals If a referral was made during today's visit and you haven't received any updates within two weeks, please contact the referred provider directly to check on the status.  Recommended Screenings:  Health Maintenance  Topic Date Due   Zoster (Shingles) Vaccine (2 of 2) 06/24/2022   COVID-19 Vaccine (7 - 2025-26 season) 03/29/2024   Flu Shot  10/26/2024*   Yearly kidney health urinalysis for diabetes  04/30/2027*   Screening for Lung Cancer  09/08/2024   Yearly kidney function blood test for diabetes  06/04/2025   Medicare Annual Wellness Visit  08/03/2025   DTaP/Tdap/Td vaccine (3 - Td or Tdap) 03/03/2030   Colon Cancer Screening  12/23/2030   Pneumococcal Vaccine for age over 68  Completed   Hepatitis C Screening  Completed   Meningitis B Vaccine  Aged Out   Complete foot exam   Discontinued   Hemoglobin A1C  Discontinued   Eye exam for diabetics  Discontinued  *Topic was postponed. The date shown is not the original due date.       08/03/2024    8:59 AM  Advanced Directives  Does Patient Have a Medical Advance Directive? Yes  Type of Estate Agent of [place];Living will  Does patient want to make changes to medical advance directive? No - Patient declined  Copy of Healthcare Power of Attorney in  Chart? Yes - validated most recent copy scanned in chart (See row information)    Vision: Annual vision screenings are recommended for early detection of glaucoma, cataracts, and diabetic retinopathy. These exams can also reveal signs of chronic conditions such as diabetes and high blood pressure.  Dental: Annual dental screenings help detect early signs of oral cancer, gum disease, and other conditions linked to overall health, including heart disease and diabetes.  Please see the attached documents for additional preventive care recommendations.   "

## 2024-08-03 NOTE — Progress Notes (Signed)
 "  Chief Complaint  Patient presents with   Medicare Wellness     Subjective:   Ian Mills is a 69 y.o. male who presents for a Medicare Annual Wellness Visit.  Visit info / Clinical Intake: Medicare Wellness Visit Type:: Subsequent Annual Wellness Visit Persons participating in visit and providing information:: patient Medicare Wellness Visit Mode:: Telephone If telephone:: video declined Since this visit was completed virtually, some vitals may be partially provided or unavailable. Missing vitals are due to the limitations of the virtual format.: Unable to obtain vitals - no equipment If Telephone or Video please confirm:: I connected with patient using audio/video enable telemedicine. I verified patient identity with two identifiers, discussed telehealth limitations, and patient agreed to proceed. Patient Location:: Home Provider Location:: Office/Home Interpreter Needed?: No Pre-visit prep was completed: yes AWV questionnaire completed by patient prior to visit?: no Living arrangements:: lives with spouse/significant other Patient's Overall Health Status Rating: very good Typical amount of pain: none Does pain affect daily life?: no Are you currently prescribed opioids?: no  Dietary Habits and Nutritional Risks How many meals a day?: 2 Eats fruit and vegetables daily?: yes Most meals are obtained by: preparing own meals In the last 2 weeks, have you had any of the following?: none Diabetic:: no  Functional Status Activities of Daily Living (to include ambulation/medication): Independent Ambulation: Independent Medication Administration: Independent Home Management (perform basic housework or laundry): Independent Manage your own finances?: yes Primary transportation is: driving Concerns about vision?: no *vision screening is required for WTM* Concerns about hearing?: no  Fall Screening Falls in the past year?: 0 Number of falls in past year: 0 Was there an  injury with Fall?: 0 Fall Risk Category Calculator: 0 Patient Fall Risk Level: Low Fall Risk  Fall Risk Patient at Risk for Falls Due to: No Fall Risks Fall risk Follow up: Falls evaluation completed; Falls prevention discussed  Home and Transportation Safety: All rugs have non-skid backing?: yes All stairs or steps have railings?: yes Grab bars in the bathtub or shower?: (!) no Have non-skid surface in bathtub or shower?: yes Good home lighting?: yes Regular seat belt use?: yes Hospital stays in the last year:: (!) yes How many hospital stays:: 2 Reason: Heart  Cognitive Assessment Difficulty concentrating, remembering, or making decisions? : no Will 6CIT or Mini Cog be Completed: yes What year is it?: 0 points What month is it?: 0 points Give patient an address phrase to remember (5 components): [address], [place] About what time is it?: 0 points Count backwards from 20 to 1: 0 points Say the months of the year in reverse: 0 points Repeat the address phrase from earlier: 0 points 6 CIT Score: 0 points  Advance Directives (For Healthcare) Does Patient Have a Medical Advance Directive?: Yes Does patient want to make changes to medical advance directive?: No - Patient declined Type of Advance Directive: Healthcare Power of [place]; Living will Copy of Healthcare Power of Attorney in Chart?: Yes - validated most recent copy scanned in chart (See row information) Copy of Living Will in Chart?: Yes - validated most recent copy scanned in chart (See row information) Would patient like information on creating a medical advance directive?: No - Patient declined  Reviewed/Updated  Reviewed/Updated: Reviewed All (Medical, Surgical, Family, Medications, Allergies, Care Teams, Patient Goals)    Allergies (verified) Nitroglycerin    Current Medications (verified) Outpatient Encounter Medications as of 08/03/2024  Medication Sig   ALPRAZolam  (XANAX ) 0.5 MG tablet  Take 1  tablet (0.5 mg total) by mouth 3 (three) times daily as needed.   amiodarone  (PACERONE ) 200 MG tablet Take 1 tablet (200 mg total) by mouth daily.   aspirin  EC 81 MG tablet Take 81 mg by mouth daily. Swallow whole.   atorvastatin  (LIPITOR) 40 MG tablet Take 1 tablet (40 mg total) by mouth daily.   clopidogrel  (PLAVIX ) 75 MG tablet Take 1 tablet (75 mg total) by mouth daily.   DM-Phenylephrine -Acetaminophen  (ALKA-SELTZER PLS SINUS & COUGH) 10-5-325 MG CAPS Take by mouth daily. (Patient taking differently: Take by mouth as needed.)   ezetimibe  (ZETIA ) 10 MG tablet Take 1 tablet (10 mg total) by mouth daily.   metoprolol  succinate (TOPROL -XL) 25 MG 24 hr tablet Take 1 tablet (25 mg total) by mouth 2 (two) times daily.   potassium chloride  (KLOR-CON ) 10 MEQ tablet Take 10 mEq by mouth daily.   sacubitril -valsartan  (ENTRESTO ) 49-51 MG Take 1 tablet by mouth 2 (two) times daily.   spironolactone  (ALDACTONE ) 25 MG tablet Take 1 tablet (25 mg total) by mouth daily.   Tiotropium Bromide -Olodaterol (STIOLTO RESPIMAT ) 2.5-2.5 MCG/ACT AERS Inhale 2 puffs into the lungs daily.   torsemide  (DEMADEX ) 20 MG tablet Take 1 tablet (20 mg total) by mouth as needed.   No facility-administered encounter medications on file as of 08/03/2024.    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 b. 06/2013 PCI: CTO LAD, patent LAD stent, RCA 70p (FFR  0.74--> DES), 55m (DES), RPDA 50%, EF 20%; c. 08/2019 Cath: LM nl, LAD 100p/m, D1 60, D2 patent stent, LCX 60ost/p, 14m ISR, RCA 30p, 38m ISR.   Dizziness 03/30/2015   Family history of lung cancer    HFrEF (heart failure with reduced ejection fraction) (HCC)    a. 05/2014 Echo: EF 25-30%; b. 01/2018 Echo ([facility]): EF 30%; c. 06/2021 Echo: EF 25-30%, apical AK, GrI DD, nl RV fxn.   HLD (hyperlipidemia)    HTN (hypertension)    Insomnia 06/01/2014   Ischemic cardiomyopathy    a. 05/2014 Echo: EF 25-30%; b. 01/2018 Echo ([facility]): EF 30%, c. 06/2021 Echo: EF 25-30%.   Kidney stone    right   LAFB (left anterior fascicular block)    Lateral wall myocardial infarction (HCC) 06/29/1997   a.) LHC: EF 44%; 75% RPDA and 95% OM3; PCI performed and a 3.0 x 18 mm Duet stent placed to OM3   Leukocytosis 08/16/2022   Long term current use of clopidogrel     Long-term use of aspirin  therapy    Nephrolithiasis    NSTEMI (non-ST elevated myocardial infarction) (HCC) 11/27/2011   a.) LHC: 100% mLAD; intervention deferred opting for medical management.   Personal history of colonic polyps    Pneumonia 08/2018   Pulmonary hypertension (HCC)    Sacroiliac pain 07/31/2016   Sepsis due  to group A Streptococcus with acute renal failure and septic shock (HCC) 2023   Syncope and collapse    Tobacco abuse 06/13/2016   Ventricular tachycardia (HCC) 12/01/2011   a.) VT arrest; ROSC achieved --> [employer] AICD placed.   Past Surgical History:  Procedure Laterality Date   APPENDECTOMY N/A    CARDIAC DEFIBRILLATOR PLACEMENT N/A 12/01/2011   CATARACT EXTRACTION W/PHACO Left 08/06/2023   Procedure: CATARACT EXTRACTION PHACO AND INTRAOCULAR LENS PLACEMENT (IOC) LEFT 4.61, 00:28.3;  Surgeon: Mittie Gaskin, MD;  Location: [facility];  Service: Ophthalmology;  Laterality: Left;   COLONOSCOPY WITH PROPOFOL  N/A 12/22/2020   Procedure: COLONOSCOPY WITH PROPOFOL ;  Surgeon: Therisa Bi, MD;  Location:  [facility]COPY;  Service: Gastroenterology;  Laterality: N/A;   CORONARY ANGIOPLASTY Left 07/05/1991   Procedure: CORONARY ANGIOPLASTY; Location: [facility]; Surgeon: Miquel Ellen, MD   CORONARY ANGIOPLASTY Left 08/06/1991   Procedure: CORONARY ANGIOPLASTY; Location: [facility]; Surgeon: Steffan Score, MD   CORONARY ANGIOPLASTY Left 08/09/1991   Procedure: CORONARY ANGIOPLASTY; Location: [facility]; Surgeon: Steffan Score, MD   CORONARY ANGIOPLASTY Left 10/28/1991   Procedure: CORONARY ANGIOPLASTY; Location: [facility]; Surgeon: Steffan Score, MD   CORONARY ANGIOPLASTY Left 11/02/1991   Procedure: CORONARY ANGIOPLASTY; Location: [facility]; Surgeon: Steffan Score, MD   CORONARY ANGIOPLASTY Left 01/10/1992   Procedure: CORONARY ANGIOPLASTY; Location: [facility]; Surgeon: Charlie Ross, MD   CORONARY ANGIOPLASTY Left 09/26/1993   Procedure: CORONARY ANGIOPLASTY; Location: [facility]; Surgeon: Charlie Hasten, MD   CORONARY ANGIOPLASTY Left 09/11/2006   Procedure: CORONARY ANGIOPLASTY; Location: [facility]   CORONARY ANGIOPLASTY Left 01/17/2010   Procedure: CORONARY ANGIOPLASTY; Location: [facility]; Surgeon: Prentice Risk, MD   CORONARY ANGIOPLASTY Left 11/27/2011   Procedure: CORONARY ANGIOPLASTY; Location: [facility]; Surgeon: Prentice Flatten, MD   CORONARY ANGIOPLASTY WITH STENT PLACEMENT Left 06/29/1997   Procedure: CORONARY ANGIOPLASTY WITH STENT PLACEMENT (3.0 x 18 mm Diet stent to OM3); Location: [facility]; Surgeon: Natthew Zidar, MD   CORONARY ANGIOPLASTY WITH STENT PLACEMENT Left 07/12/1998   Procedure: CORONARY ANGIOPLASTY WITH STENT PLACEMENT (2.8 x 8 mm Duet stent to D1, 2.5 x 13 mm Duet stent to RPDA); Location: [facility]   CYSTOSCOPY/URETEROSCOPY/HOLMIUM LASER/STENT PLACEMENT Right 04/05/2024   Procedure: CYSTOSCOPY/URETEROSCOPY/HOLMIUM LASER/STENT PLACEMENT;  Surgeon: Francisca Redell BROCKS, MD;  Location: [facility] ORS;  Service: Urology;  Laterality: Right;   GRAFT APPLICATION Left 08/24/2021   Procedure: GRAFT APPLICATION;  Surgeon: Tye Millet, DO;  Location:  [facility] ORS;  Service: General;  Laterality: Left;   ICD GENERATOR CHANGEOUT N/A 06/14/2024   Procedure: ICD GENERATOR CHANGEOUT;  Surgeon: Kennyth Chew, MD;  Location: [facility] INVASIVE CV LAB;  Service: Cardiovascular;  Laterality: N/A;   LEFT HEART CATH AND CORONARY ANGIOGRAPHY Left 08/30/2019   Procedure: LEFT HEART CATH AND CORONARY ANGIOGRAPHY;  Surgeon: Darron Deatrice LABOR, MD;  Location: [facility] INVASIVE CV LAB;  Service: Cardiovascular;  Laterality: Left;   LITHOTRIPSY Right 2025   PROSTATE SURGERY     urethra grew around prostate    TUMOR REMOVAL  2001   chest thymic cyst; behind lungs [facility] benign    Family History  Problem Relation Age of Onset   Cancer - Lung Mother    Hypertension Mother    Heart attack Mother    Cancer Father        larynx   Dementia Maternal Grandmother    Heart attack Paternal Grandfather    Colon cancer Neg Hx    Stomach cancer Neg Hx    Pancreatic cancer Neg Hx    Social History   Occupational History  Occupation: retired   Tobacco Use   Smoking status: Former    Current packs/day: 0.00    Average packs/day: 0.5 packs/day for 42.0 years (21.0 ttl pk-yrs)    Types: Cigarettes    Start date: 11/09/1977    Quit date: 11/10/2019    Years since quitting: 4.7   Smokeless tobacco: Never  Vaping Use   Vaping status: Never Used  Substance and Sexual Activity   Alcohol  use: Yes    Alcohol /week: 12.0 standard drinks of alcohol     Types: 12 Cans of beer per week    Comment: Rare   Drug use: No   Sexual activity: Not on file   Tobacco Counseling Counseling given: Not Answered  SDOH Screenings   Food Insecurity: No Food Insecurity (08/03/2024)  Housing: Low Risk (08/03/2024)  Transportation Needs: No Transportation Needs (08/03/2024)  Utilities: Not At Risk (08/03/2024)  Alcohol  Screen: Low Risk (08/03/2024)  Depression (PHQ2-9): Low Risk (08/03/2024)  Financial Resource Strain: Low Risk (08/03/2024)  Physical Activity: Insufficiently Active (08/03/2024)  Social  Connections: Moderately Integrated (08/03/2024)  Stress: No Stress Concern Present (08/03/2024)  Tobacco Use: Medium Risk (08/03/2024)  Health Literacy: Adequate Health Literacy (08/03/2024)   See flowsheets for full screening details  Depression Screen PHQ 2 & 9 Depression Scale- Over the past 2 weeks, how often have you been bothered by any of the following problems? Little interest or pleasure in doing things: 0 Feeling down, depressed, or hopeless (PHQ Adolescent also includes...irritable): 0 PHQ-2 Total Score: 0 Trouble falling or staying asleep, or sleeping too much: 0 Feeling tired or having little energy: 0 Poor appetite or overeating (PHQ Adolescent also includes...weight loss): 0 Feeling bad about yourself - or that you are a failure or have let yourself or your family down: 0 Trouble concentrating on things, such as reading the newspaper or watching television (PHQ Adolescent also includes...like school work): 0 Moving or speaking so slowly that other people could have noticed. Or the opposite - being so fidgety or restless that you have been moving around a lot more than usual: 0 Thoughts that you would be better off dead, or of hurting yourself in some way: 0 PHQ-9 Total Score: 0 If you checked off any problems, how difficult have these problems made it for you to do your work, take care of things at home, or get along with other people?: Not difficult at all     Goals Addressed             This Visit's Progress    Patient Stated       Wants to keep weight off             Objective:    Today's Vitals   08/03/24 0854  Weight: 205 lb (93 kg)  Height: 5' 8 (1.727 m)   Body mass index is 31.17 kg/m.  Hearing/Vision screen Hearing Screening - Comments:: No issues Vision Screening - Comments:: Contacts, [facility], up to date Immunizations and Health Maintenance Health Maintenance  Topic Date Due   Zoster Vaccines- Shingrix  (2 of 2) 06/24/2022   COVID-19  Vaccine (7 - 2025-26 season) 03/29/2024   Influenza Vaccine  10/26/2024 (Originally 02/27/2024)   Diabetic kidney evaluation - Urine ACR  04/30/2027 (Originally 03/03/2021)   Lung Cancer Screening  09/08/2024   Diabetic kidney evaluation - eGFR measurement  06/04/2025   Medicare Annual Wellness (AWV)  08/03/2025   DTaP/Tdap/Td (3 - Td or Tdap) 03/03/2030   Colonoscopy  12/23/2030  Pneumococcal Vaccine: 50+ Years  Completed   Hepatitis C Screening  Completed   Meningococcal B Vaccine  Aged Out   FOOT EXAM  Discontinued   HEMOGLOBIN A1C  Discontinued   OPHTHALMOLOGY EXAM  Discontinued        Assessment/Plan:  This is a routine wellness examination for Ian Mills.  Patient Care Team: Gretel App, NP as PCP - General (Nurse Practitioner) Perla Evalene PARAS, MD as PCP - Cardiology (Cardiology) Kennyth Chew, MD as PCP - Electrophysiology (Cardiology) Perla Evalene PARAS, MD as Consulting Physician (Cardiology) Therisa Bi, MD as Consulting Physician (Gastroenterology) Riddle, Suzann, NP as Nurse Practitioner (Clinical Cardiac Electrophysiology)  I have personally reviewed and noted the following in the patients chart:   Medical and social history Use of alcohol , tobacco or illicit drugs  Current medications and supplements including opioid prescriptions. Functional ability and status Nutritional status Physical activity Advanced directives List of other physicians Hospitalizations, surgeries, and ER visits in previous 12 months Vitals Screenings to include cognitive, depression, and falls Referrals and appointments  No orders of the defined types were placed in this encounter.  In addition, I have reviewed and discussed with patient certain preventive protocols, quality metrics, and best practice recommendations. A written personalized care plan for preventive services as well as general preventive health recommendations were provided to patient.   Angeline Fredericks, LPN   02/29/7972    Return in 1 year (on 08/03/2025).  After Visit Summary: (MyChart) Due to this being a telephonic visit, the after visit summary with patients personalized plan was offered to patient via MyChart   Nurse Notes: Discussed the need to update flu, shingles and covid vaccines. Patient stated that he will probably get his flu vaccine at his pharmacy.  "

## 2024-08-09 ENCOUNTER — Encounter

## 2024-08-11 ENCOUNTER — Ambulatory Visit: Admitting: Urology

## 2024-08-11 VITALS — BP 127/75 | HR 83 | Ht 68.0 in | Wt 200.8 lb

## 2024-08-11 DIAGNOSIS — N200 Calculus of kidney: Secondary | ICD-10-CM

## 2024-08-11 LAB — URINALYSIS, COMPLETE
Bilirubin, UA: NEGATIVE
Glucose, UA: NEGATIVE
Ketones, UA: NEGATIVE
Leukocytes,UA: NEGATIVE
Nitrite, UA: NEGATIVE
Protein,UA: NEGATIVE
Specific Gravity, UA: 1.025 (ref 1.005–1.030)
Urobilinogen, Ur: 0.2 mg/dL (ref 0.2–1.0)
pH, UA: 5.5 (ref 5.0–7.5)

## 2024-08-11 LAB — MICROSCOPIC EXAMINATION: Bacteria, UA: NONE SEEN

## 2024-08-11 NOTE — Patient Instructions (Signed)
 Mix half teaspoon baking soda with 8 ounces of water  3 times daily.  This should help prevent recurrent uric acid kidney stones.

## 2024-08-11 NOTE — Progress Notes (Signed)
" ° °  08/11/2024 3:11 PM   Ian Mills 1955/12/10 [id number]  Reason for visit: Follow up uric acid stones, ED  History: Number of cardiac comorbidities History of prior TURP at outside hospital, no urinary complaints ED on Cialis  Uric acid stones, previously required ureteroscopy September 2025 Did not tolerate potassium citrate   Physical Exam: BP 127/75 (BP Location: Left Arm, Patient Position: Sitting, Cuff Size: Normal)   Pulse 83   Ht 5' 8 (1.727 m)   Wt 200 lb 12.8 oz (91.1 kg)   BMI 30.53 kg/m   Imaging/labs: Urine today pH 5.5, otherwise benign  Today: He has not been particularly compliant with the baking soda recommendation for urine alkalinization, has been using 1 teaspoon/day intermittently Denies any flank pain, urinary symptoms, or gross hematuria  Plan:   Uric acid nephrolithiasis: Did not tolerate potassium citrate  previously, recommended half teaspoon baking soda with 8 ounces of water  3 times daily to help alkalinize urine and prevent recurrent uric acid stones RTC 1 year UA for pH check   Ian JAYSON Burnet, MD  [facility] [address] [place], [place] [address] [phone]  "

## 2024-08-31 ENCOUNTER — Other Ambulatory Visit: Payer: Self-pay | Admitting: Cardiology

## 2024-09-06 ENCOUNTER — Ambulatory Visit

## 2024-09-09 ENCOUNTER — Ambulatory Visit (HOSPITAL_BASED_OUTPATIENT_CLINIC_OR_DEPARTMENT_OTHER): Admitting: Radiology

## 2024-09-14 ENCOUNTER — Ambulatory Visit: Admitting: Cardiology

## 2024-10-07 ENCOUNTER — Encounter

## 2024-11-02 ENCOUNTER — Ambulatory Visit (INDEPENDENT_AMBULATORY_CARE_PROVIDER_SITE_OTHER): Admitting: Vascular Surgery

## 2024-11-02 ENCOUNTER — Encounter (INDEPENDENT_AMBULATORY_CARE_PROVIDER_SITE_OTHER)

## 2024-11-09 ENCOUNTER — Encounter

## 2024-11-25 ENCOUNTER — Ambulatory Visit: Admitting: Nurse Practitioner

## 2024-12-06 ENCOUNTER — Ambulatory Visit

## 2025-03-07 ENCOUNTER — Ambulatory Visit

## 2025-08-08 ENCOUNTER — Ambulatory Visit

## 2025-08-11 ENCOUNTER — Ambulatory Visit: Admitting: Urology
